# Patient Record
Sex: Female | Born: 1935
Health system: Southern US, Community
[De-identification: ages and names within clinical notes are randomized; demographics above are authoritative.]

## PROBLEM LIST (undated history)

## (undated) DIAGNOSIS — N183 Chronic kidney disease, stage 3 unspecified: Secondary | ICD-10-CM

## (undated) DIAGNOSIS — G4730 Sleep apnea, unspecified: Secondary | ICD-10-CM

## (undated) DIAGNOSIS — E782 Mixed hyperlipidemia: Secondary | ICD-10-CM

## (undated) DIAGNOSIS — I2510 Atherosclerotic heart disease of native coronary artery without angina pectoris: Secondary | ICD-10-CM

## (undated) DIAGNOSIS — K2100 Gastro-esophageal reflux disease with esophagitis, without bleeding: Secondary | ICD-10-CM

## (undated) DIAGNOSIS — I442 Atrioventricular block, complete: Secondary | ICD-10-CM

## (undated) DIAGNOSIS — M1990 Unspecified osteoarthritis, unspecified site: Secondary | ICD-10-CM

## (undated) DIAGNOSIS — I639 Cerebral infarction, unspecified: Secondary | ICD-10-CM

## (undated) DIAGNOSIS — I214 Non-ST elevation (NSTEMI) myocardial infarction: Secondary | ICD-10-CM

## (undated) DIAGNOSIS — I10 Essential (primary) hypertension: Secondary | ICD-10-CM

## (undated) DIAGNOSIS — E669 Obesity, unspecified: Secondary | ICD-10-CM

## (undated) HISTORY — DX: Chronic kidney disease, stage 3 unspecified: N18.30

## (undated) HISTORY — DX: Mixed hyperlipidemia: E78.2

## (undated) HISTORY — DX: Essential (primary) hypertension: I10

## (undated) HISTORY — DX: Obesity, unspecified: E66.9

## (undated) HISTORY — DX: Sleep apnea, unspecified: G47.30

## (undated) HISTORY — PX: CHOLECYSTECTOMY: SHX55

## (undated) HISTORY — DX: Cerebral infarction, unspecified: I63.9

## (undated) HISTORY — DX: Unspecified osteoarthritis, unspecified site: M19.90

## (undated) HISTORY — DX: Chronic kidney disease, stage 3 (moderate): N18.3

---

## 1999-09-27 ENCOUNTER — Encounter: Payer: Self-pay | Admitting: Neurosurgery

## 1999-09-27 ENCOUNTER — Encounter: Admission: RE | Admit: 1999-09-27 | Discharge: 1999-09-27 | Payer: Self-pay | Admitting: Neurosurgery

## 2000-05-23 ENCOUNTER — Ambulatory Visit (HOSPITAL_COMMUNITY): Admission: RE | Admit: 2000-05-23 | Discharge: 2000-05-23 | Payer: Self-pay | Admitting: Rehabilitation

## 2000-05-23 ENCOUNTER — Encounter: Payer: Self-pay | Admitting: Cardiology

## 2002-03-04 ENCOUNTER — Encounter: Payer: Self-pay | Admitting: Cardiology

## 2002-03-04 ENCOUNTER — Ambulatory Visit (HOSPITAL_COMMUNITY): Admission: RE | Admit: 2002-03-04 | Discharge: 2002-03-05 | Payer: Self-pay | Admitting: Cardiology

## 2004-05-01 ENCOUNTER — Encounter: Admission: RE | Admit: 2004-05-01 | Discharge: 2004-05-01 | Payer: Self-pay | Admitting: Cardiology

## 2004-07-13 ENCOUNTER — Inpatient Hospital Stay (HOSPITAL_COMMUNITY): Admission: EM | Admit: 2004-07-13 | Discharge: 2004-07-15 | Payer: Self-pay

## 2004-07-14 ENCOUNTER — Encounter (INDEPENDENT_AMBULATORY_CARE_PROVIDER_SITE_OTHER): Payer: Self-pay | Admitting: Cardiology

## 2005-09-10 HISTORY — PX: CORONARY ANGIOPLASTY WITH STENT PLACEMENT: SHX49

## 2005-09-21 ENCOUNTER — Ambulatory Visit (HOSPITAL_COMMUNITY): Admission: RE | Admit: 2005-09-21 | Discharge: 2005-09-21 | Payer: Self-pay | Admitting: Family Medicine

## 2006-09-08 ENCOUNTER — Inpatient Hospital Stay (HOSPITAL_COMMUNITY): Admission: EM | Admit: 2006-09-08 | Discharge: 2006-09-11 | Payer: Self-pay | Admitting: Emergency Medicine

## 2006-09-24 ENCOUNTER — Inpatient Hospital Stay (HOSPITAL_COMMUNITY): Admission: AD | Admit: 2006-09-24 | Discharge: 2006-09-26 | Payer: Self-pay | Admitting: Cardiovascular Disease

## 2006-10-17 ENCOUNTER — Encounter (HOSPITAL_COMMUNITY): Admission: RE | Admit: 2006-10-17 | Discharge: 2006-11-16 | Payer: Self-pay | Admitting: Cardiology

## 2006-11-18 ENCOUNTER — Encounter (HOSPITAL_COMMUNITY): Admission: RE | Admit: 2006-11-18 | Discharge: 2006-12-18 | Payer: Self-pay | Admitting: Cardiology

## 2006-12-20 ENCOUNTER — Encounter (HOSPITAL_COMMUNITY): Admission: RE | Admit: 2006-12-20 | Discharge: 2007-01-19 | Payer: Self-pay | Admitting: Cardiology

## 2008-04-30 ENCOUNTER — Ambulatory Visit (HOSPITAL_COMMUNITY): Admission: RE | Admit: 2008-04-30 | Discharge: 2008-04-30 | Payer: Self-pay | Admitting: Family Medicine

## 2008-08-23 ENCOUNTER — Emergency Department (HOSPITAL_COMMUNITY): Admission: EM | Admit: 2008-08-23 | Discharge: 2008-08-23 | Payer: Self-pay | Admitting: Emergency Medicine

## 2009-11-08 DIAGNOSIS — I2510 Atherosclerotic heart disease of native coronary artery without angina pectoris: Secondary | ICD-10-CM

## 2009-11-08 HISTORY — DX: Atherosclerotic heart disease of native coronary artery without angina pectoris: I25.10

## 2010-09-27 DIAGNOSIS — G4730 Sleep apnea, unspecified: Secondary | ICD-10-CM

## 2010-09-27 HISTORY — DX: Sleep apnea, unspecified: G47.30

## 2011-01-06 ENCOUNTER — Emergency Department (HOSPITAL_COMMUNITY)
Admission: EM | Admit: 2011-01-06 | Discharge: 2011-01-06 | Disposition: A | Discharge status: Home / Self Care | Payer: Medicare Other | Attending: Emergency Medicine | Admitting: Emergency Medicine

## 2011-01-06 ENCOUNTER — Emergency Department (HOSPITAL_COMMUNITY): Payer: Medicare Other

## 2011-01-06 DIAGNOSIS — Y93E2 Activity, laundry: Secondary | ICD-10-CM | POA: Insufficient documentation

## 2011-01-06 DIAGNOSIS — X500XXA Overexertion from strenuous movement or load, initial encounter: Secondary | ICD-10-CM | POA: Insufficient documentation

## 2011-01-06 DIAGNOSIS — Y998 Other external cause status: Secondary | ICD-10-CM | POA: Insufficient documentation

## 2011-01-06 DIAGNOSIS — I2510 Atherosclerotic heart disease of native coronary artery without angina pectoris: Secondary | ICD-10-CM | POA: Insufficient documentation

## 2011-01-06 DIAGNOSIS — Z7902 Long term (current) use of antithrombotics/antiplatelets: Secondary | ICD-10-CM | POA: Insufficient documentation

## 2011-01-06 DIAGNOSIS — S6390XA Sprain of unspecified part of unspecified wrist and hand, initial encounter: Secondary | ICD-10-CM | POA: Insufficient documentation

## 2011-01-06 DIAGNOSIS — Z7982 Long term (current) use of aspirin: Secondary | ICD-10-CM | POA: Insufficient documentation

## 2011-01-06 DIAGNOSIS — I10 Essential (primary) hypertension: Secondary | ICD-10-CM | POA: Insufficient documentation

## 2011-01-06 DIAGNOSIS — Z79899 Other long term (current) drug therapy: Secondary | ICD-10-CM | POA: Insufficient documentation

## 2011-01-06 DIAGNOSIS — Y92009 Unspecified place in unspecified non-institutional (private) residence as the place of occurrence of the external cause: Secondary | ICD-10-CM | POA: Insufficient documentation

## 2011-01-26 NOTE — H&P (Signed)
NAMESuzannah, [name]                ACCOUNT NO.:  [id number]   MEDICAL RECORD NO.:  [id number]          PATIENT TYPE:  INP   LOCATION:                               FACILITY:  [facility]   PHYSICIAN:  Quita Skye. Collman, MDDATE OF BIRTH:  May 18, 1934   DATE OF ADMISSION:  07/13/2004  DATE OF DISCHARGE:                                HISTORY & PHYSICAL   [name] is a 77 year old black woman who is admitted to [facility] for further evaluation of chest pain.   The patient most recently underwent cardiac catheterization in 2003 for  chest pain.  This demonstrated nonobstructive coronary artery disease.  Coronary circulation was left dominant.  There was a 60% mid LAD lesion with  evidence of myocardial bridging.  There was also a 30% mid right coronary  lesion.  Left ventricular function was normal.  Coronary circulation was  left dominant.   The patient has a history of hypertension and dyslipidemia.  There is no  history of diabetes mellitus, smoking, or family history of coronary artery  disease.   The patient presented to the emergency department after experiencing an  episode chest pain which began at home.  This was described as a pressure  which was located in the mid substernal region.  It did not radiate.  It was  associated with nausea and diaphoresis, but no dyspnea.  It began while she  was at rest.  There were no other associated symptoms.  It appeared to be  unrelated to position, activity, meals, or respirations.  There were no  exacerbating or ameliorating factors.  It resolved spontaneously after  approximately 20 minutes.  She had a similar, but briefer, episode in the  hospital.  She denied chest pain and otherwise asymptomatic at this time.   The patient is on a number of medications for the aforementioned problems.  These include Verapamil, Coreg, Celebrex, Nexium, and aspirin.   MEDICATION ALLERGIES:  CODEINE AND DARVOCET.   PREVIOUS OPERATIONS:   Cholecystectomy and carpal tunnel release.   FAMILY HISTORY:  Noncontributory.   REVIEW OF SYSTEMS:  Reveals no new problems related to her head, eyes, ears,  nose, mouth, throat, lungs, gastrointestinal system, genitourinary system,  or extremities.  There is no history of neurologic or psychiatric disorder.  There is no history of fever, chills, or weight loss.   PHYSICAL EXAMINATION:  Blood pressure 119/57, pulse 61 and regular,  respirations 22, temperature __________  .  The patient was an obese, middle-  aged black woman in no discomfort.  She was alert, oriented, appropriate,  and responsive.  HEAD/EYES/NOSE/MOUTH:  Normal.  NECK:  Without thyromegaly or adenopathy.  Carotid pulses were palpable  bilaterally and without bruits.  CARDIAC:  Examination revealed a normal S1 and S2.  There was no S3, S4,  murmur, rub, or click.  Cardiac rhythm was regular.  No chest wall  tenderness was noted.  LUNGS:  Clear.  ABDOMEN:  Soft and nontender.  There was no mass, tenderness,  hepatosplenomegaly, bruit, distention, rebound, guarding, or rigidity.  Bowel sounds were normal.  RECTAL/GENITALIA:  Examinations were not performed as they were not  pertinent to the reason for acute care hospitalization.  EXTREMITIES:  Without edema, deviation, or deformity.  Brief screening  neurologic survey was unremarkable.   The electrocardiogram revealed sinus bradycardia with first degree AV block  and right bundle branch block.  The chest radiograph, according to the  radiologist, demonstrated cardiomegaly and pulmonary venous hypertension.  The initial set of cardiac markers revealed a myoglobin of 57.3, CK-MB 1.6,  and troponin less than 0.05.  The second set of markers revealed a myoglobin  of 74.0, CK-MB 1.4, and troponin less than 0.05.  Potassium was 4.5, BUN 8,  creatinine 0.8.  White count was 5.4 with hemoglobin of 14.4 and a  hematocrit of 41.7.  D-Dimer was positive.  The chest CT  demonstrated no  evidence of pulmonary embolus.   IMPRESSION:  1.  Chest pain, rule out unstable angina.  A pulmonary embolus has been      excluded.  2.  Nonobstructive coronary artery disease.  Cardiac catheterization in 2003      demonstrated normal left ventricular function and a left dominant      coronary circulation.  There was a 60 lesion in the mid left anterior      descending artery with myocardial bridging.  There was a 30% mid right      coronary artery lesion.  3.  Hypertension.  4.  Dyslipidemia.   PLAN:  1.  Telemetry.  2.  Serial cardiac enzymes.  3.  Aspirin.  4.  Intravenous heparin.  5.  Intravenous nitroglycerin.  6.  Continue Coreg.  7.  Further measures per Dr. Patty Sermons.        ___________________________________________  Quita Skye. Waldon Reining, MD    MSC/MEDQ  D:  07/13/2004  T:  07/13/2004  Job:  [id number]   cc:   [name], M.D.  [address]  [place]  [place] [address]  Fax: [phone]

## 2011-01-26 NOTE — Discharge Summary (Signed)
NAMColvin Caroli:  [name], [name]                ACCOUNT NO.:  [id number]   MEDICAL RECORD NO.:  [id number]          PATIENT TYPE:  INP   LOCATION:  3704                         FACILITY:  [facility]   PHYSICIAN:  Madaline SavageWilliam H. Gamble, M.D.DATE OF BIRTH:  08-Feb-1934   DATE OF ADMISSION:  09/08/2006  DATE OF DISCHARGE:  09/11/2006                               DISCHARGE SUMMARY   DISCHARGE DIAGNOSES:  1. Subendocardial myocardial infarction this admission, treated with      ramus intermedius Cypher stenting by Dr. Tresa EndoKelly.  2. History of coronary disease with prior right coronary artery      stenting, November of 2005.  3. Preserved LV function.  4. Right bundle branch block.  5. Obesity.  6. Treated hypertension.  7. Treated dyslipidemia.   HOSPITAL COURSE:  The patient is a 77 year old African American female  with a history of coronary disease.  She is followed by Dr. Elsie LincolnGamble.  She  had an RCA stent in 2005.  She was admitted September 08, 2006, after she  awakened with substernal chest pain with radiation to her back.  It was  associated with weakness and nausea and vomiting.  Initial enzymes were  negative but second set showed a troponin of 0.24.  She was admitted by  Dr. Tresa EndoKelly.  Subsequent CK-MBs peaked at 2351 with 288 MBs.  She was put  on IV Heparin, nitrates, taken to the cath lab on September 09, 2006.  Catheterization revealed a patent RCA stent, patent LAD with some  myocardial bridging, patent diagonal, patent circumflex with a 60% to  70% mid to distal circumflex narrowing, and a total proximal ramus  intermedius.  She underwent PCI and Cypher stenting to the ramus  intermedius with good results.  The patient was transferred to telemetry  and ambulated.  Hemoglobin was 6.1.  We did make some adjustments in her  medications prior to discharge.  We increased her Vytorin to 10/80 as  her LDL was 102.  We stopped her verapamil and changed it to Norvasc and  added an ACE inhibitor.  She will  follow up with Dr. Elsie LincolnGamble as an  outpatient.   LABORATORY:  Sodium 139, potassium 4.3, BUN 8, creatinine 0.7.  LFTs are  normal.  TSH is 1.03.  white count 6.4, hemoglobin 14.3, hematocrit  40.4, platelets 220.  Hemoglobin A1c is 6.1.  INR is 1.0.  CKs peaked to  2351 and 288 MBs.  Lipids:  Show a cholesterol of 259, HDL 51, LDL was  198, repeat LDL was 102.  Chest x-ray showed mild cardiomegaly without  congestive failure.   EKG shows sinus rhythm with right bundle branch block and left anterior  fascicular block.   DISCHARGE MEDICATIONS:  1. Coated aspirin daily.  2. Coreg 12.5 mg b.i.d.  3. Vytorin 10/80 daily.  4. Norvasc 5 mg a day.  5. Lisinopril 10 mg a day.  6. Nitroglycerin sublingual p.r.n.  7. Plavix 75 mg a day.  8. Nexium daily.   DISPOSITION:  The patient is discharged in stable condition and will  follow up with Dr. Elsie LincolnGamble as  an outpatient.      Abelino Derrick, P.A.    ______________________________  Madaline Savage, M.D.    Lenard Lance  D:  09/11/2006  T:  09/11/2006  Job:  [id number]   cc:   Madaline Savage, M.D.  Patrica Duel, M.D.

## 2011-01-26 NOTE — Discharge Summary (Signed)
NAMColvin Johnson:  Hoerner, Aleksa                ACCOUNT NO.:  [id number]   MEDICAL RECORD NO.:  [id number]          PATIENT TYPE:  INP   LOCATION:  2623                         FACILITY:  [facility]   PHYSICIAN:  Madaline SavageWilliam H. Gamble, M.D.DATE OF BIRTH:  29-Oct-1933   DATE OF ADMISSION:  09/24/2006  DATE OF DISCHARGE:  09/26/2006                               DISCHARGE SUMMARY   Ms. [name] is a 77 year old African American female patient who is  the mother of Lamar BenesSandra Schwegel, Charity fundraiserN.  She came to the office because of  severe abdominal pain, nausea, vomiting and unable to take her  medications.  She had recently been in the hospital and discharged on  September 11, 2006 with a subendocardial MI and she had a CYPHER stent  placed to her ramus intermedius.  It was decided that she should be  admitted.  She was unable to take her medications.  She had vomited once  and she had such severe pain that she could not sleep all night.  Thus,  she was admitted.  A GI consult was called.  She was seen by Dr. Elnoria HowardHung.  A CT of her abdomen and pelvis were ordered, along with an ultrasound.  Urinalysis was done because of recent Foley catheterization and  complaints of frequency.  A CT of her abdomen only showed  diverticulosis, no diverticulitis, no pancreatitis.  Her amylase and  lipase were normal, along with her lactic acid.  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 Larey DresserLloyd Peterson.  He recommended that she be given  trimethoprim 100 mg b.i.d. x3 days.  He also thought that her  retroperitoneal fibrosis could sometimes be related to malignancy or  autoimmune disease.  Also, medications can cause this which include  hydralazine, beta blockers and methysergide.  Also, lymphoma can  sometimes mimic retroperitoneal fibrosis.  His recommendations were to  treat her medically.  Her abdominal pain was improved at this time and  that she should be followed as  an outpatient with ultrasound, at least  every 6 months initially, to make sure she does not develop any  hydronephrosis.  He felt that the retroperitoneal fibrosis could just be  the incidental finding also.  She was seen by Dr. Elsie LincolnGamble on September 26, 2006.  Her blood pressure was low.  Her medications were adjusted.  Later on, in the afternoon, she walked in the hall with a stable blood  pressure and it was decided that she was stable to be discharged home.   LABS:  Sodium was 133, potassium was 4.0, chloride of 100, CO2 24,  glucose was 117, BUN 7, creatinine 0.80.  SGOT was 33 and SGPT was 24.  Alkaline phosphatase was 89.  Bilirubin was 0.6.  Lipase was 16.  Her  lactic acid was 1.5.  Blood cultures were negative and her urine culture  was negative.  Abdominal ultrasound showed no acute abdominal CT finding  and she is status post cholecystectomy with mild prominence of common  bile duct.  Abdominal CT scan showed sigmoid diverticulosis without  diverticulitis.  She had abdominal inflammatory changes of  retroperitoneum centered around her bilateral ureters.  No  hydronephrosis.  Normal perfusion and excretion of contrast by both  kidneys.  Findings were determined to be nonspecific.  It also stated  inflammatory process involving the ureters and retroperitoneal fibrosis  was also a clinical consideration.  She had some fat density lesion  within her distal small bowel, consistent with lipoma and she had  bilateral low density lesions within both kidneys, likely cysts and a  hiatal hernia.   DISCHARGE MEDICATIONS:  1. Nexium 40 mg 1 time per day.  2. Aspirin 81 mg 1 time per day.  3. Plavix 75 mg 1 timer per day.  4. Coreg 3.125 mg twice per day.  5. Vytorin 10/80 at bedtime.  6. Trimethoprim 100 mg 2 times a day for 4 more doses.  7. She is not to take any of her other medications.   She is to return to Dr. Elsie Lincoln on January 31 at 2 p.m. and she is to see  Dr. Vonita Moss if she  has any problems urologically and follow up with Dr.  Nobie Putnam in 3 weeks.  Also, she will need have followup abdominal and  renal ultrasound in 6 months per Dr. Enos Fling advice.   DISCHARGE DIAGNOSES:  1. 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  2. Coronary artery disease, recent stent to her ramus intermedius with      a subendocardial myocardial infarction.  She needs to remain on      Plavix for 1 year, secondary to her CYPHER stent.  3. Hypotension, resolved with decrease in medications.  4. Hyperlipidemia.  5. Right bundle branch block.  6. History of cholecystectomy.      Lezlie Octave, N.P.    ______________________________  Madaline Savage, M.D.    BB/MEDQ  D:  09/26/2006  T:  09/27/2006  Job:  [id number]   cc:   Patrica Duel, M.D.  Maretta Bees. Vonita Moss, M.D.  Jordan Hawks Elnoria Howard, MD

## 2011-01-26 NOTE — Cardiovascular Report (Signed)
[place]  Patient:    [name], [name] Visit Number: [id number] MRN: [id number]          Service Type: CAT Location: 3700 [id number] Attending Physician:  Ophelia ShoulderGamble, William Hedrick Dictated by:   Madaline SavageWilliam H. Gamble, Johnson.D. Proc. Date: 03/04/02 Admit Date:  03/04/2002   CC:         Jonell CluckMark S. Cresenzo, Johnson.D.  Blake DivineA. Tori Bradsher, Johnson.D.  Cardiac Catheterization Laboratory   Cardiac Catheterization  PROCEDURES PERFORMED: 1. Selective coronary angiography by Judkins technique. 2. Retrograde left heart catheterization. 3. Left ventricular angiography. 4. Intravascular ultrasound of the left anterior descending coronary artery.  COMPLICATIONS: None.  ENTRY SITE: Right femoral.  DYE USED: Omnipaque.  MEDICATIONS GIVEN: Versed 1 mg and fentanyl 50 mg for neck pain and sedation.  PATIENT PROFILE: The patient is a 77 year old African American woman who has had chest pain of a chronic stable anginal nature for four years. She has been treated with verapamil for asymmetric septal hypertrophy and diastolic dysfunction of the heart and is known to have a long segment of her mid to distal LAD that is felt to be intramyocardial.  The patient recently had a Cardiolite stress test showing evidence of anterior wall ischemia mild to moderate degree with no significant interval change since the Cardiolite of May 16, 2000. The left ventricular ejection fraction was 62%.  RESULTS:  PRESSURES: The left ventricular pressure was 137/14, central aortic pressure was 135/70 with a mean of 100.  No aortic valve gradient was noted by pullback technique.  ANGIOGRAPHIC RESULTS: The left main coronary artery was a very large vessel and normal in appearance.  The LAD coursed to the cardiac apex and wrapped around the apex and gave rise to a very large diagonal branch before the septal perforator branch and a second diagonal branch near the origin of the third septal  perforator branch. Both diagonals were normal. The LAD right after the second diagonal and third septal perforator becomes smoothly narrowed over a segment of about 25 mm with a smooth lumen and no appearance of any atherosclerotic stenosis. The LAD more distal shows a slight kinking motion during systole, and the vessel again becomes a fairly large vessel as it approaches the apex. This vessel appears as it did on a prior catheterization dated 2001. The circumflex coronary artery and intermediate ramus branch are both very large and are dominant vessels. No lesions are seen in either vessel.  The right coronary artery is small and nondominant with a 30% mid right coronary artery lesion.  The left ventricle shows symmetrical contractility of all wall segments. There is noted to be left ventricular hypertrophy seen. There is no LV thrombus. Ejection fraction estimate of 65% with no mitral regurgitation.  Intravascular ultrasound was then performed using a 6 French left Judkins guide catheter without side holes. A Patriot guide wire and a Atlantis intravascular ultrasound catheter. The patient was given 2500 units of heparin and was also given 200 mcg of intracoronary nitroglycerin before the Atlantis device was deployed. I made extra sure to begin IVUS imaging distal in the very distal portion of the LAD almost near the apex, well beyond the area of suspected intramyocardial bridging.  IVUS showed that the distal LAD lumen diameter was 3 without significant plaquing as the vessel began to show systolic bridging, the vessel then becomes 2.25 to 2 mm in diameter in the mid segment of the intramyocardial bridged LAD segment, we note a lumen diameter  of about 1.5 without plaquing. As we approach the second diagonal branch, which is the beginning of the area of myocardial bridging, the LAD now becomes 3 mm again and as we progressively pull back the Atlantis catheter, the vessel becomes 4  and finally 5 mm.  There is mild plaquing in the more proximal LAD which is not significant.  FINAL DIAGNOSES: 1. Clinical angina with anterior wall ischemia by Cardiolite. 2. A 60% intraluminal smooth stenosis of the mid left anterior descending    with suspected intramyocardial bridging phenomenon. 3. Dominant left circumflex coronary artery. 4. Insignificant 30% stenosis in the mid right coronary artery. 5. Normal left ventricular systolic function with angiographic evidence of    LVH.  PLAN: The patients dose of verapamil will be increased. We will use an alpha and beta blocker combination for treatment of angina and will continue to follow the patient. Dictated by:   Madaline Savage, Johnson.D. Attending Physician:  Ophelia Shoulder DD:  03/04/02 TD:  03/05/02 Job: [id number]

## 2011-01-26 NOTE — Discharge Summary (Signed)
NAMEJOHNNAE, IMPASTATO                ACCOUNT NO.:  [id number]   MEDICAL RECORD NO.:  [id number]          PATIENT TYPE:  INP   LOCATION:  6526                         FACILITY:  [facility]   PHYSICIAN:  Madaline Savage, M.D.DATE OF BIRTH:  Jul 31, 1934   DATE OF ADMISSION:  07/12/2004  DATE OF DISCHARGE:  07/15/2004                                 DISCHARGE SUMMARY   HISTORY OF PRESENT ILLNESS:  Ms. [name] is a 77 year old, African  American female patient who was admitted to the hospital with chest pain.  She does have a history of nonobstructive coronary disease in the past.  She  had a catheterization in 2003.  She had a 60% mid LAD lesion with do  myocardial bridging.  She had a 30% mid right coronary lesion.  LV function  was normal at that time.   HOSPITAL COURSE:  She was admitted, put on IV heparin and IV nitroglycerin  and her medicines were continued.  She was seen by Dr. Elsie Lincoln the following  day, who is apparently her primary care doctor and knows her well.  Her last  catheterization was on March 02, 2004.  At that time, it was stable.  She  does have known right bundle branch block.  She also has been having  lightheadedness and bradycardia.  This was thought secondary to her  medications.  It was felt that she should undergo repeat cardiac  catheterization because of her symptoms.  This was performed by Dr. Elsie Lincoln.  She was found to have a progressed lesion in her RCA of 75%.  Her EF was 70-  80%.  She still has myocardial bridging in her LAD and ramus.  She underwent  PTCA and stenting with a 2.5 x 15 mm Taxus stent to her RCA.  Post procedure  on July 15, 2004, she had no chest pain.  Her blood pressure was 118/52.  Her pulse was 60-72, respirations 18 and she was afebrile.  Her room air  saturations were 97%.  Her right groin was without any hematoma.  She had 2+  dorsalis pedis pulse.  She was seen by cardiac rehabilitation and referred  for phase 2.   LABORATORIES:  On July 15, 2004, the hemoglobin was 13.7, hematocrit  39.4, WBC 11.4 and platelets 194.  Her sodium was 138, potassium 3, BUN 7  and creatinine 0.8.  Her glucose was 113.   DISCHARGE MEDICATIONS:  1.  Aspirin 81 mg a day.  2.  Plavix 75 mg every day for six months.  She should take it through April      and then she may stop.  3.  Coreg 7.5 mg one twice a day.  4.  Nexium 40 mg one time per day.  5.  Vytorin 10/40 mg at bedtime.  6.  Verapamil 240 mg once a day.  7.  Ambien 5 mg as needed to sleep.  8.  Celebrex.  She should start on Sunday and use only as needed.  9.  Nitroglycerin 1/150 mg under the tongue as needed.  10. Diazepam 5 mg twice a  day as needed for back spasm.   ACTIVITY:  She should do no strenuous activity, no lifting, no pushing and  no pulling x 4 days.  No driving x 2 days.   FOLLOWUP:  She should call with any problems with her groin.  She will  follow up with Dr. Elsie Lincoln in approximately two weeks.   DISCHARGE DIAGNOSES:  1.  Chest pain, unstable angina pectoris, with subsequent cardiac      catheterization showing progressed lesion in her right coronary artery,      75%, with subsequent percutaneous transluminal coronary angiography and      2.5 x 15 mm Taxus stent placed.  2.  Known left anterior descending and ramus bridging.  Minor lesion, 40%,      in her left circumflex.  3.  Normal ejection fraction, 70-80%.  4.  Hypertension.  5.  History of lightheadedness and bradycardia secondary to her medications,      which were titrated this admission.  6.  Dyslipidemia.  7.  Back spasm.       BB/MEDQ  D:  07/15/2004  T:  07/16/2004  Job:  [id number]   cc:   Patrica Duel, M.D.  [address]  [place]  [place] [address]  Fax: [phone]

## 2011-01-26 NOTE — Consult Note (Signed)
NAMEShadara, [name]                ACCOUNT NO.:  [id number]   MEDICAL RECORD NO.:  [id number]          PATIENT TYPE:  INP   LOCATION:  3704                         FACILITY:  [facility]   PHYSICIAN:  Jordan Hawks. Elnoria Howard, MD    DATE OF BIRTH:  03/24/1934   DATE OF CONSULTATION:  09/24/2006  DATE OF DISCHARGE:  09/11/2006                                 CONSULTATION   REASON FOR CONSULTATION:  Diffuse abdominal pain, nausea and vomiting.   HISTORY OF PRESENT ILLNESS:  This is a 77 year old female with a past  medical history of hypertension, coronary artery disease, arthritis,  hyperlipidemia, obesity and status post two stent placements in 2005 and  recently in 2008 for coronary artery disease, right bundle branch block,  and laparoscopic cholecystectomy in 1998, who is admitted to the  hospital from the cardiology office for complaints of a diffuse  abdominal pain.  The patient states that the pain started approximately  one we have ago and is progressive.  It is described as being sharp and  initially in the epigastric region.  However, it has tended to be more  diffuse at this time.  Eating will worsened the patient's abdominal  pain.  However, lying in the left lateral decubitus position and  remaining still will help abate the pain.  She does report having a  worsening of this type of pain if she takes any type of p.o.  Subsequently, she has a restricted her p.o. intake at this time, as a  result of this pain.  Having a bowel movement or passing flatus does not  improve the pain.  Additionally, there are no reports of any  hematochezia, diarrhea or melena.  The patient denies any prior luminal  GI problems such as any prior luminal diseases.  The patient was  recently discharged from hospital for a subendocardial and myocardial  infarction.  She had undergone the catheterization without any  difficulty with the Cypher stent placement which she tolerated well.   PAST MEDICAL AND SURGICAL  HISTORY:  As stated above.   FAMILY HISTORY:  Noncontributory.   SOCIAL HISTORY:  The patient lives with her daughter.  She is retired.  Has eleventh grade education.   ALLERGIES:  PENICILLIN, IMDUR, CODEINE AND DARVOCET/.   REVIEW OF SYSTEMS:  Negative for shortness of breath, chest pain,  fevers, diarrhea, constipation.  Positive for abdominal pain.  No  hematochezia, melena.  No dizziness, blurry vision, syncopal episodes.  No chest pain.  Positive for nausea and vomiting.  Positive for history  of GERD.   MEDICATIONS:  1. Coreg 12.5 mg p.o. b.i.d.  2. Diazepam 5 mg p.o. p.r.n.  3. Nexium 40 mg p.o. daily.  4. Celebrex 200 mg p.o. daily.  5. Vytorin 10/80 mg p.o. daily.  6. Ambien 5 mg p.o. q.h.s.  7. Aspirin 81 mg p.o. daily.  8. Norvasc 5 mg p.o. daily.  9. Lisinopril 10 mg p.o. daily.  10.Plavix 75 mg daily   PHYSICAL EXAMINATION:  VITAL SIGNS:  Blood pressure is 150/74, heart  rate is 90, temperature is 98.6.  Weight is 230 pounds.  GENERAL:  The patient does not appear to be in acute distress.  However,  she is uncomfortable.  HEENT:  Normocephalic, atraumatic.  Extraocular muscles intact.  Pupils  equal, round, reactive to light.  NECK:  Supple.  No lymphadenopathy.  LUNGS:  Clear to auscultation bilaterally.  CARDIOVASCULAR:  Regular rhythm.  ABDOMEN:  Obese, soft.  There are surgical scars consistent with her  laparoscopic cholecystectomy.  There is diffuse tenderness throughout.  No rebound or rigidity.  EXTREMITIES:  No clubbing, cyanosis or edema.  RECTAL:  Examination is negative for hemoccult.  There is brown stool on  the examination glove.   LABORATORY VALUES:  Pending at this time.   IMPRESSION:  1. Abdominal pain of questionable etiology.  Differential is drawn at      this time.  2. Recent subendocardial myocardial infarction secondary to coronary      artery disease status post stent placement.  3. Gastroesophageal reflux disease.  4.  Arthritis.  5. Hyperlipidemia.  6. Hypertension.   PLAN:  After evaluation, the source of her abdominal pain is not clear  at this time.  However, a possibility is pancreatitis.  I do not see any  medications that are recent.  There is no clear precipitant of her of  pancreatitis in regards to medications aside for the aspirin which is a  salicylate.  However, that is rare in regards to the cause of  pancreatitis.  Further evaluation with the CT scan is required at this  time.  Other considerations may be intestinal ischemia, and with her  history of coronary artery disease, it would be prudent to evaluate the  patient with a lactic acid fast level.  The patient may require a CT  angiography.  However, I believe a prudent initial first step will be  with a routine CT scan of the abdomen and pelvis.  Additionally, the  patient will also require pain control for her current symptoms.      Jordan Hawks Elnoria Howard, MD  Electronically Signed     PDH/MEDQ  D:  09/24/2006  T:  09/25/2006  Job:  [id number]   cc:   Madaline Savage, M.D.

## 2011-01-26 NOTE — Consult Note (Signed)
[name], [name]                ACCOUNT NO.:  [id number]   MEDICAL RECORD NO.:  [id number]          PATIENT TYPE:  INP   LOCATION:  2623                         FACILITY:  [facility]   PHYSICIAN:  Maretta Bees. Vonita Moss, M.D.DATE OF BIRTH:  [birth year]   DATE OF CONSULTATION:  09/25/2006  DATE OF DISCHARGE:                                 CONSULTATION   I was asked to see this 77 year old lady for evaluation of abdominal CT  that showed findings consistent with some retroperitoneal fibrosis.  CT  was done because she had some generalized abdominal pain that is now  better.  CT scan was consistent with but not necessarily diagnostic of  retroperitoneal fibrosis.  Fortunately, the kidneys showed no evidence  of hydronephrosis.  There was some thickening around the ureters.  She  had bilateral benign renal cysts.  She has had no outright flank pain,  she has had no hematuria.  She passed a stone several years ago and she  has had some frequency of urination recently, had a Foley catheter in,  and there was a concern that she might have had a urinary infection.  Incontinence was not a major problem.   PAST MEDICAL HISTORY:  Hypertension, hyperlipidemia, coronary artery  disease.   PAST SURGICAL HISTORY:  Cholecystectomy, carpal tunnel, tubal ligation,  appendectomy.   MEDICATIONS ON ADMISSION:  Coreg, Niaspan, Nexium, Celebrex, Vytorin,  Ambien, aspirin, Norvasc, Lisinopril, and Plavix.   ALLERGIES:  PENICILLIN, IMDUR, CODEINE, DARVOCET.   SOCIAL HISTORY:  She does not smoke or drink alcohol.   FAMILY HISTORY:  Noncontributory.   REVIEW OF SYSTEMS:  As noted on her health history form.   PHYSICAL EXAMINATION:  Blood pressure is a little volatile, running from  150/87 to 103/59, pulse has been running 70 to 80, and she has been  afebrile.  She is alert and oriented, skin is warm and dry, in no acute  distress. Neck is supple. She is somewhat heavy set.  There is no  abdominal or CVA  tenderness.   Urine had 3-6 white cells, 0-2 red cells, and a few bacteria.  BUN 7,  creatinine 0.8.   IMPRESSION:  1. Urinary frequency which is somewhat worse later, consider UTI,      although urine does not look all that remarkable.  Nonetheless, I      think we will put her on Trimethoprim 100 mg b.i.d. for three days,      make sure there is no underlying infection related to her previous      catheterization.  2. Bilateral benign renal cysts, no treatment necessary.  3. CT scan showing findings consistent with retroperitoneal fibrosis,      but fortunately, no hydronephrosis is noted.   She is not a candidate for surgery and, right now, this appears to be a  medical disease.  The causes of retroperitoneal fibrosis are sometimes  malignancy or auto-immune disease.  Medications that can cause this  problem include hydralazine, beta blockers, and methysergide.  Also,  lymphoma may sometimes mimic retroperitoneal fibrosis.  Medical  treatments when necessary and sometimes  require a biopsy include  steroids and Tamoxifen.  Her abdominal pain seems to be better and that  is sometimes caused by retroperitoneal fibrosis, but this may also be an  incidental finding.  I defer medical workup and medical treatment but  from a urological point of view, I would recommend follow up evaluation,  ultrasound at least every six months initially to make sure she does not  develop hydronephrosis.      Maretta BeesLloyd J. Vonita MossPeterson, M.D.  Electronically Signed     LJP/MEDQ  D:  09/25/2006  T:  09/25/2006  Job:  [id number]   cc:   Gerlene Burdockichard A. Alanda AmassWeintraub, M.D.

## 2011-01-26 NOTE — Cardiovascular Report (Signed)
NAMEMANROOP, [name]                ACCOUNT NO.:  [id number]   MEDICAL RECORD NO.:  [id number]          PATIENT TYPE:  INP   LOCATION:  6599                         FACILITY:  [facility]   PHYSICIAN:  Madaline Savage, M.D.DATE OF BIRTH:  1934-06-22   DATE OF PROCEDURE:  07/14/2004  DATE OF DISCHARGE:                              CARDIAC CATHETERIZATION   PROCEDURES PERFORMED:  1.  Selective coronary angiography by Judkins technique.  2.  Retrograde left heart catheterization.  3.  Left ventricular angiography.  4.  Percutaneous coronary intervention of the mid right coronary artery with      balloon angioplasty followed by coronary stenting.   COMPLICATIONS:  None.   ENTRY SITE:  Right femoral.   DYE USED:  Omnipaque.   PATIENT PROFILE:  The patient is a 77 year old obese African American female  with trivial coronary artery disease who has a long segment of  intramyocardial mid LAD followed by an area of a kinking type of myocardial  bridge and then a normal distal LAD segment.  No other significant coronary  disease has been seen in the coronary tree in the past except for a 30% to  40% area of luminal irregularity in the mid RCA.  She has been known to have  normal-to-high ejection fraction and has asymmetric septal hypertrophy on  her echocardiogram, and has been treated with beta blockade and Verapamil  therapy for episodes of angina associated with her nonobstructive  hypertrophic myopathy.  No percutaneous intervention has been done in the  past.   This admission was prompted by an episode of bradycardia possibly related to  her medication, lightheadedness and chest pain.  Cardiac enzymes have been  negative.  There were no EKG changes of ischemia.  The patient was brought  to the cath lab electively for repeat catheterization.  Her films were  compared to the last study dated 2003.   RESULTS:   PRESSURES:  The left ventricular pressure was 165/10, end-diastolic  pressure  18, central aortic pressure 165/80, mean of 115.  No aortic gradient by  pullback technique.   ANGIOGRAPHIC RESULTS:  Left main coronary artery was normal.   Left anterior descending coronary artery contained the same area of  intramyocardial mid LAD that it has in the past.  It lies over approximately  22-32 mm of length followed by an S-shaped kink in the vessel that is the  end of a myocardial bridge and then the distal LAD again looks normal  distally.  No obstructive coronary disease is seen in the entirety of the  LAD, nor in the diagonal branch.   The intermediate ramus branch bifurcates distally and is a large vessel.  There is a myocardial bridge which is virtually adjacent to the area of  bridging in the LAD, interestingly.  The circumflex is codominant with the  right; it contains only a 40% area of luminal irregularity after the first  obtuse marginal branch.   Right coronary artery is a little on the small side and is codominant with  the circumflex.  It contains, today, a 75% stenosis in  the mid LAD just  before the acute marginal branch arises and this stenosis is worse than it  was in 2003.   LV angiography shows almost cavity obliteration of the distal one-third of  the LV cavity and ejection fraction estimated at 70% to 80%.  No mitral  regurgitation is noted.   PERCUTANEOUS CORONARY INTERVENTION:  Percutaneous coronary intervention was  performed after infusing intravenous heparin to ensure an ACT that ended up  being approximately 305.  We gave double-bolus Integrilin and we maintained  a 6-French arterial sheath.  The guide catheter used was a 6-French 4-cm  right Judkins guide catheter by [employer].  The guidewire was a  Radiographer, therapeutic which crossed the lesion with ease.  Pre-dilatation ballooning  was performed, but with a Maverick 2.5 x 9.0-mm balloon, and post-balloon  dilatation was accomplished with a 2.5 x 16.0-mm TAXUS stent which was   deployed twice, with the second deployment being for a minute up to a peak  inflation pressure of 16 atmospheres.  The lesion was reduced from 75% to 0%  and multiple views confirmed that result.  TIMI-3 class flow was preserved  before and after stenting.   FINAL DIAGNOSES:  1.  Worsened lesion in the mid right coronary artery previously 30% to 40%,      now 75%.  2.  Successful coronary artery stenting of the mid right coronary artery      with reduction of the lesion of 75% to 0%.  3.  Forty percent distal circumflex stenosis, new.  4.  Unchanged anatomy of a left anterior descending intramyocardial segment,      mid-vessel, with an S-shaped kinking bridge more distal to the      intramyocardial left anterior descending.  5.  Super-normal left ventricular systolic function.   PLAN:  The patient was given 300 mg of Plavix in the cath lab, Pepcid and  will be given 300 mg more of Plavix tonight, and Integrilin will be  continued for 18 hours.       WHG/MEDQ  D:  07/14/2004  T:  07/15/2004  Job:  [id number]   cc:   [name], [facility]   [facility]

## 2011-01-26 NOTE — Cardiovascular Report (Signed)
NAMELEOTA, MAKA                ACCOUNT NO.:  [id number]   MEDICAL RECORD NO.:  [id number]          PATIENT TYPE:  INP   LOCATION:  2910                         FACILITY:  [facility]   PHYSICIAN:  Nicki Guadalajara, M.D.     DATE OF BIRTH:  Mar 19, 1934   DATE OF PROCEDURE:  09/09/2006  DATE OF DISCHARGE:                            CARDIAC CATHETERIZATION   PROCEDURE:  Cardiac catheterization and percutaneous coronary  intervention.   INDICATIONS:  Ms. [name] is a 77 year old African American female  patient of Dr. Elsie Lincoln with known CAD status post stenting of her right  coronary artery in 2005.  At that time, she did have a mid-LAD  intramyocardial branch with some kinking.  She has a history of  hypertension and hyperlipidemia.  She had presented yesterday morning.  She was awakened from sleep at approximately 3:00 a.m. with chest  pressure.  She ultimately presented to [facility] later in the  day.  Cardiac markers were positive.  She was started on heparin, IC  nitroglycerin as well as Integrilin due to ST-segment depression in the  setting of her right bundle branch block.  Cardiac enzymes subsequently  were positive.  She has been maintained on Integrilin/heparin and  nitroglycerin.  Has had mild residual chest tightness and is now  referred for cardiac catheterization and possible coronary intervention.   PROCEDURE:  After premedication with Valium 5 mg intravenously, the  patient was prepped and draped in the usual fashion.  Her right femoral  artery was punctured anteriorly and a 5-French sheath was inserted.  Diagnostic catheters was done utilizing 5-French FL-5 catheter as well  as a 5-French FR-4 diagnostic catheter.  A pigtail catheter was used for  biplane selective artery.  Due to the patient's significant hypertension  distal aortography was also done.  There was no renovascular etiology to  her hypertension.  With the demonstration of total occlusion of a  ramus  intermediate vessel with faint collaterals the decision was made to  attempt intervention.  The old films were reviewed which did demonstrate  a previous large ramus intermediate system.  The arterial sheath was  upgraded to a 6-French system.  The patient has been on Integrilin and  she received 300 mg of oral Plavix.  She received 4000 units of heparin  after ACT was recorded.  Subsequent ACT was therapeutic.  A 6-French CLS-  4 catheter was used for the intervention.  A Cougar XT wire was able to  cross the total occlusion.  Predilatation just beyond the ostium of the  intermediate vessel was done with a 20 x 15-mm sprinter balloon.  This  resulted in restoration of flow, but it was apparent that there was a  significant thrombus burden and a fetch thrombectomy catheter was  inserted with significant thrombus removal.  A 3.0 x 18-mm drug-eluting  CYPHER stent was then successfully inserted and deployed just after the  ostium x2 up to 16 atmospheres.  A 3.25 x 15-mm Quantum balloon was used  for post stent dilatation up to 14 atmospheres corresponding to a 3.29  mm  size.  Scout angiography confirmed an excellent angiographic result  with brisk TIMI III flow and a residual narrowing of 0%.  During the  procedure the patient also did receive Lopressor IV 2.5 mg x2 due to her  continued hypertension and her nitroglycerin drip was titrated up to 60  mcg.  Also during the procedure because of repetitive need for urination  prior to the start of the intervention a Foley catheter was also  inserted.  The arterial sheath was sutured in place with plan for sheath  removal later today.   HEMODYNAMIC DATA:  Central aortic pressure was 197/101, mean 144, left  ventricular pressure of 197/17, post A-wave 31.   ANGIOGRAPHIC DATA:  The left main coronary artery was a very short  vessel which trifurcated into an LAD, a ramus intermediate vessel and  left circumflex vessel.   The LAD was a  large caliber vessel that gave rise to one large first  diagonal vessel.  After the second diagonal vessel in the mid LAD there  was intramyocardial segment which appeared to have muscle bridging  during systole of approximately 50%.  Faint collateralization was seen  in the distal intermediate vessel free via the LAD injection.   The intermediate vessel was totally occluded proximally.   The circumflex vessel was a large caliber codominant vessel.  There was  60-70% focal narrowing distally prior to the PLA origin after two  marginal vessels distally.   The right coronary was a nondominant vessel with a widely patent stent  in its mid segment.   Biplane selective arteriography revealed preserved global contractility  with an ejection fraction of at least 60%.  On the RAO projection there  was a subtle minimal focal hypocontractility in the distal anterolateral  wall.  On the LAO projection there was subtle minimal hypocontractility  in the mid posterolateral wall.   Distal aortography did not demonstrate any renal artery stenosis.  There  was no significant aortoiliac disease.   Following percutaneous coronary intervention after the initial  dilatation with the sprinter balloon this resulted in resumption of TIMI  III flow, but just beyond the initial lesion was a significant thrombus  burden.  This cleared following utilization of the fetch thrombectomy  catheter.  Following successful stenting with a 3.0 x 18 mm CYPHER stent  post dilated to 3.29 mm the 100% occlusion was reduced to 0%.  There was  brisk TIMI III flow.  The ramus intermedius vessel was a large-caliber  vessel which extended to the apex and bifurcated in its mid segment.   IMPRESSION:  1. Acute coronary syndrome secondary to total occlusion of the ramus      intermediate vessel with faint left-to-left collaterals. 2. Mid LAD intramyocardial midsystolic bridging with narrowing of      approximately 50% during  systole.  3. Total occlusion of the ramus intermediate vessel.  4. Sixty to 70% distal stenosis in the circumflex vessel in a dominant      circumflex system.  5. Widely patent stent in the mid right coronary artery.  6. Successful PTCA/thrombectomy/stenting of the ramus intermediate      vessel with the 100% stenosis being reduced to 0% resulting in      brisk TIMI III flow after insertion of a 3.0 x 18 mm CYPHER stent      post dilated to 3.29 mm.           ______________________________  Nicki Guadalajara, M.D.     TK/MEDQ  D:  09/09/2006  T:  09/09/2006  Job:  [id number]   cc:   Madaline Savage, M.D.  Patrica Duel, M.D.

## 2011-01-26 NOTE — Cardiovascular Report (Signed)
[place]  Patient:    [name], [name]                       MRN: [id number] Proc. Date: 05/23/00 Adm. Date:  [id number] Attending:  Ophelia Shoulder CC:         Cardiac Catheterization Laboratory   Cardiac Catheterization  PROCEDURES PERFORMED: 1. Selective coronary angiography by Judkins technique. 2. Retrograde left heart catheterization. 3. Left ventricular angiography.  COMPLICATIONS:  None.  ENTRY SITE:  Right femoral.  DYE USED:  Omnipaque.  PATIENT PROFILE:  The patient is a 77 year old woman who has had periodic anginal chest pain.  A recent nuclear medicine Cardiolite stress test showed evidence of anterior wall ischemia by previous cardiac catheterization.  The patient was known to have a long segmental stenosis in her mid to distal LAD. Todays outpatient cardiac catheterization is done on an outpatient basis electively to reaccess her coronary anatomy.  RESULTS:  PRESSURES:  The left ventricular pressure was 150/22, the central aortic pressure was 150/80, mean of 110.  No aortic valve gradient by pullback technique.  RESULTS:  The left main coronary artery was angiographically patent.  The left anterior descending artery coursed at the cardiac apex and gave rise to one optional diagonal branch arising before the first septal perforator branch and a second small diagonal branch arising after the second septal perforator branch.  After the second septal perforator branch there is a 65-70% lesion in the mid LAD that contains some systolic bridging.  Then the distal LAD becomes normal in size again and has a normal segment.  There is a intermediate ramus branch which bifurcates distally which is normal.  The left circumflex is a dominant vessel with a posterior descending branch arising distally.  The right coronary artery is nondominant and contains no lesions.  LEFT VENTRICULOGRAM:  The left ventricle shows  normal contractility with prominent papillary muscles consistent with left ventricular hypertrophy.  No wall motion abnormalities are seen.  The ejection fraction is 70%.  There is no mitral regurgitation or LV thrombus.  FINAL DIAGNOSES:  Isolated 60-70% stenosis in the mid left anterior descending, unchanged from 1977.  PLAN:  Continued medical therapy. DD:  05/23/00 TD:  05/23/00 Job: [id number]

## 2011-01-26 NOTE — Cardiovascular Report (Signed)
[name], [name]                ACCOUNT NO.:  [id number]   MEDICAL RECORD NO.:  [id number]          PATIENT TYPE:  INP   LOCATION:  2910                         FACILITY:  [facility]   PHYSICIAN:  Nicki Guadalajara, M.D.     DATE OF BIRTH:  30-Oct-1933   DATE OF PROCEDURE:  09/09/2006  DATE OF DISCHARGE:                            CARDIAC CATHETERIZATION           ______________________________  Nicki Guadalajara, M.D.     TK/MEDQ  D:  09/09/2006  T:  09/09/2006  Job:  [id number]

## 2011-06-15 LAB — POCT CARDIAC MARKERS
CKMB, poc: 1.6 ng/mL (ref 1.0–8.0)
Myoglobin, poc: 79.9 ng/mL (ref 12–200)
Troponin i, poc: <0.05 ng/mL (ref 0.00–0.09)

## 2011-06-15 LAB — URINALYSIS, ROUTINE W REFLEX MICROSCOPIC
Bilirubin Urine: NEGATIVE
Glucose, UA: NEGATIVE mg/dL
Ketones, ur: NEGATIVE mg/dL
Leukocytes, UA: NEGATIVE
Nitrite: NEGATIVE
Protein, ur: NEGATIVE mg/dL
Specific Gravity, Urine: 1.005 (ref 1.005–1.030)
Urobilinogen, UA: 0.2 mg/dL (ref 0.0–1.0)
pH: 6.5 (ref 5.0–8.0)

## 2011-06-15 LAB — DIFFERENTIAL
Basophils Absolute: 0 10*3/uL (ref 0.0–0.1)
Basophils Relative: 0 % (ref 0–1)
Eosinophils Absolute: 0 10*3/uL (ref 0.0–0.7)
Eosinophils Relative: 0 % (ref 0–5)
Lymphocytes Relative: 32 % (ref 12–46)
Lymphs Abs: 1.6 10*3/uL (ref 0.7–4.0)
Monocytes Absolute: 0.4 10*3/uL (ref 0.1–1.0)
Monocytes Relative: 7 % (ref 3–12)
Neutro Abs: 2.9 10*3/uL (ref 1.7–7.7)
Neutrophils Relative %: 60 % (ref 43–77)

## 2011-06-15 LAB — BASIC METABOLIC PANEL
BUN: 13 mg/dL (ref 6–23)
CO2: 31 mEq/L (ref 19–32)
Calcium: 9.2 mg/dL (ref 8.4–10.5)
Chloride: 104 mEq/L (ref 96–112)
Creatinine, Ser: 0.74 mg/dL (ref 0.4–1.2)
GFR calc Af Amer: >60 mL/min (ref >60)
GFR calc non Af Amer: >60 mL/min (ref >60)
Glucose, Bld: 130 mg/dL — ABNORMAL HIGH (ref 70–99)
Potassium: 3.4 mEq/L — ABNORMAL LOW (ref 3.5–5.1)
Sodium: 141 mEq/L (ref 135–145)

## 2011-06-15 LAB — CBC
HCT: 39.6 % (ref 36.0–46.0)
Hemoglobin: 13.4 g/dL (ref 12.0–15.0)
MCHC: 33.9 g/dL (ref 30.0–36.0)
MCV: 88.2 fL (ref 78.0–100.0)
Platelets: 198 10*3/uL (ref 150–400)
RBC: 4.49 MIL/uL (ref 3.87–5.11)
RDW: 16.4 % — ABNORMAL HIGH (ref 11.5–15.5)
WBC: 4.9 10*3/uL (ref 4.0–10.5)

## 2011-06-15 LAB — URINE MICROSCOPIC-ADD ON

## 2011-09-13 DIAGNOSIS — E782 Mixed hyperlipidemia: Secondary | ICD-10-CM | POA: Diagnosis not present

## 2011-09-13 DIAGNOSIS — I119 Hypertensive heart disease without heart failure: Secondary | ICD-10-CM | POA: Diagnosis not present

## 2011-09-13 DIAGNOSIS — I2510 Atherosclerotic heart disease of native coronary artery without angina pectoris: Secondary | ICD-10-CM | POA: Diagnosis not present

## 2011-09-14 DIAGNOSIS — E782 Mixed hyperlipidemia: Secondary | ICD-10-CM | POA: Diagnosis not present

## 2011-09-14 DIAGNOSIS — R5383 Other fatigue: Secondary | ICD-10-CM | POA: Diagnosis not present

## 2011-09-14 DIAGNOSIS — R5381 Other malaise: Secondary | ICD-10-CM | POA: Diagnosis not present

## 2011-09-14 DIAGNOSIS — Z79899 Other long term (current) drug therapy: Secondary | ICD-10-CM | POA: Diagnosis not present

## 2011-09-21 DIAGNOSIS — J343 Hypertrophy of nasal turbinates: Secondary | ICD-10-CM | POA: Diagnosis not present

## 2011-09-21 DIAGNOSIS — J019 Acute sinusitis, unspecified: Secondary | ICD-10-CM | POA: Diagnosis not present

## 2011-12-01 ENCOUNTER — Emergency Department (HOSPITAL_COMMUNITY)
Admission: EM | Admit: 2011-12-01 | Discharge: 2011-12-01 | Disposition: A | Discharge status: Home / Self Care | Payer: Medicare Other | Attending: Emergency Medicine | Admitting: Emergency Medicine

## 2011-12-01 ENCOUNTER — Emergency Department (HOSPITAL_COMMUNITY): Payer: Medicare Other

## 2011-12-01 ENCOUNTER — Other Ambulatory Visit: Payer: Self-pay

## 2011-12-01 ENCOUNTER — Encounter (HOSPITAL_COMMUNITY): Payer: Self-pay | Admitting: Emergency Medicine

## 2011-12-01 DIAGNOSIS — R072 Precordial pain: Secondary | ICD-10-CM | POA: Diagnosis not present

## 2011-12-01 DIAGNOSIS — E7800 Pure hypercholesterolemia, unspecified: Secondary | ICD-10-CM | POA: Insufficient documentation

## 2011-12-01 DIAGNOSIS — I452 Bifascicular block: Secondary | ICD-10-CM | POA: Insufficient documentation

## 2011-12-01 DIAGNOSIS — R112 Nausea with vomiting, unspecified: Secondary | ICD-10-CM | POA: Diagnosis not present

## 2011-12-01 DIAGNOSIS — I2510 Atherosclerotic heart disease of native coronary artery without angina pectoris: Secondary | ICD-10-CM | POA: Diagnosis not present

## 2011-12-01 DIAGNOSIS — I10 Essential (primary) hypertension: Secondary | ICD-10-CM | POA: Insufficient documentation

## 2011-12-01 DIAGNOSIS — A088 Other specified intestinal infections: Secondary | ICD-10-CM | POA: Insufficient documentation

## 2011-12-01 DIAGNOSIS — R109 Unspecified abdominal pain: Secondary | ICD-10-CM | POA: Insufficient documentation

## 2011-12-01 DIAGNOSIS — A084 Viral intestinal infection, unspecified: Secondary | ICD-10-CM

## 2011-12-01 DIAGNOSIS — R0789 Other chest pain: Secondary | ICD-10-CM | POA: Diagnosis not present

## 2011-12-01 HISTORY — DX: Gastro-esophageal reflux disease with esophagitis, without bleeding: K21.00

## 2011-12-01 HISTORY — DX: Gastro-esophageal reflux disease with esophagitis: K21.0

## 2011-12-01 HISTORY — DX: Atherosclerotic heart disease of native coronary artery without angina pectoris: I25.10

## 2011-12-01 LAB — COMPREHENSIVE METABOLIC PANEL
ALT: 12 U/L (ref 0–35)
AST: 17 U/L (ref 0–37)
Albumin: 3.5 g/dL (ref 3.5–5.2)
Alkaline Phosphatase: 69 U/L (ref 39–117)
BUN: 11 mg/dL (ref 6–23)
CO2: 24 mEq/L (ref 19–32)
Calcium: 9.4 mg/dL (ref 8.4–10.5)
Chloride: 107 mEq/L (ref 96–112)
Creatinine, Ser: 0.73 mg/dL (ref 0.50–1.10)
GFR calc Af Amer: >90 mL/min (ref >90)
GFR calc non Af Amer: 82 mL/min — ABNORMAL LOW (ref >90)
Glucose, Bld: 118 mg/dL — ABNORMAL HIGH (ref 70–99)
Potassium: 3.4 mEq/L — ABNORMAL LOW (ref 3.5–5.1)
Sodium: 141 mEq/L (ref 135–145)
Total Bilirubin: 0.4 mg/dL (ref 0.3–1.2)
Total Protein: 6.8 g/dL (ref 6.0–8.3)

## 2011-12-01 LAB — URINE MICROSCOPIC-ADD ON

## 2011-12-01 LAB — CBC
HCT: 38 % (ref 36.0–46.0)
Hemoglobin: 13.2 g/dL (ref 12.0–15.0)
MCH: 29.7 pg (ref 26.0–34.0)
MCHC: 34.7 g/dL (ref 30.0–36.0)
MCV: 85.6 fL (ref 78.0–100.0)
Platelets: 187 10*3/uL (ref 150–400)
RBC: 4.44 MIL/uL (ref 3.87–5.11)
RDW: 14.8 % (ref 11.5–15.5)
WBC: 5.2 10*3/uL (ref 4.0–10.5)

## 2011-12-01 LAB — DIFFERENTIAL
Basophils Absolute: 0 10*3/uL (ref 0.0–0.1)
Basophils Relative: 0 % (ref 0–1)
Eosinophils Absolute: 0.1 10*3/uL (ref 0.0–0.7)
Eosinophils Relative: 1 % (ref 0–5)
Lymphocytes Relative: 34 % (ref 12–46)
Lymphs Abs: 1.8 10*3/uL (ref 0.7–4.0)
Monocytes Absolute: 0.5 10*3/uL (ref 0.1–1.0)
Monocytes Relative: 9 % (ref 3–12)
Neutro Abs: 2.9 10*3/uL (ref 1.7–7.7)
Neutrophils Relative %: 56 % (ref 43–77)

## 2011-12-01 LAB — URINALYSIS, ROUTINE W REFLEX MICROSCOPIC
Glucose, UA: NEGATIVE mg/dL
Hgb urine dipstick: NEGATIVE
Ketones, ur: 15 mg/dL — AB
Nitrite: NEGATIVE
Protein, ur: NEGATIVE mg/dL
Specific Gravity, Urine: 1.028 (ref 1.005–1.030)
Urobilinogen, UA: 1 mg/dL (ref 0.0–1.0)
pH: 6 (ref 5.0–8.0)

## 2011-12-01 LAB — D-DIMER, QUANTITATIVE: D-Dimer, Quant: 0.48 ug/mL-FEU (ref 0.00–0.48)

## 2011-12-01 LAB — PRO B NATRIURETIC PEPTIDE: Pro B Natriuretic peptide (BNP): 67.9 pg/mL (ref 0–450)

## 2011-12-01 LAB — CARDIAC PANEL(CRET KIN+CKTOT+MB+TROPI)
CK, MB: 2.1 ng/mL (ref 0.3–4.0)
Relative Index: 1.7 (ref 0.0–2.5)
Total CK: 127 U/L (ref 7–177)
Troponin I: <0.3 ng/mL (ref <0.30)

## 2011-12-01 LAB — LIPASE, BLOOD: Lipase: 10 U/L — ABNORMAL LOW (ref 11–59)

## 2011-12-01 MED ORDER — ONDANSETRON HCL 4 MG/2ML IJ SOLN
4.0000 mg | Freq: Once | INTRAMUSCULAR | Status: AC
  Administered 2011-12-01: 4 mg via INTRAVENOUS
  Filled 2011-12-01: qty 2

## 2011-12-01 MED ORDER — SODIUM CHLORIDE 0.9 % IV SOLN
INTRAVENOUS | Status: DC
  Administered 2011-12-01: 11:00:00 via INTRAVENOUS

## 2011-12-01 MED ORDER — ONDANSETRON 4 MG PO TBDP: 4.0000 mg | ORAL_TABLET | Freq: Three times a day (TID) | ORAL | Status: AC | PRN

## 2011-12-01 MED ORDER — ONDANSETRON 4 MG PO TBDP: 4.0000 mg | ORAL_TABLET | Freq: Three times a day (TID) | ORAL | Status: DC | PRN

## 2011-12-01 MED ORDER — MORPHINE SULFATE 4 MG/ML IJ SOLN
4.0000 mg | Freq: Once | INTRAMUSCULAR | Status: DC
  Filled 2011-12-01: qty 1

## 2011-12-01 NOTE — ED Provider Notes (Signed)
History     CSN: [id number]  Arrival date & time 12/01/11  4098   First MD Initiated Contact with Patient 12/01/11 [phone]      Chief Complaint  Patient presents with  . Emesis    (Consider location/radiation/quality/duration/timing/severity/associated sxs/prior treatment) HPI Comments: Patient is a 78 year old woman who abdominal pain nausea and vomiting intermittently since last Sunday, 6 days ago. She was seen by her primary care physician, Assunta Found M.D. This morning, and he advised her to go to the hospital because she had epigastric pain that went up into her chest. She has a prior history of coronary artery disease, and has had heart attack with stenting about 5 years ago. She is currently on Plavix and aspirin. She is seen for her heart condition at [facility] heart and vascular.  Patient is a 78 y.o. female presenting with vomiting. The history is provided by the patient and medical records. No language interpreter was used.  Emesis  This is a new problem. The current episode started more than 2 days ago. The problem occurs 2 to 4 times per day. The problem has not changed since onset.The emesis has an appearance of stomach contents. There has been no fever. Associated symptoms include abdominal pain and diarrhea. Pertinent negatives include no chills and no fever.    Past Medical History  Diagnosis Date  . Coronary artery disease   . Hypertension   . High cholesterol   . Reflux esophagitis     No past surgical history on file.  No family history on file.  History  Substance Use Topics  . Smoking status: Not on file  . Smokeless tobacco: Not on file  . Alcohol Use: No    OB History    Grav Para Term Preterm Abortions TAB SAB Ect Mult Living                  Review of Systems  Constitutional: Negative.  Negative for fever and chills.  HENT: Negative.   Eyes: Negative.   Respiratory: Negative.   Cardiovascular: Positive for chest pain.  Gastrointestinal:  Positive for nausea, vomiting, abdominal pain and diarrhea.  Genitourinary: Negative.   Musculoskeletal: Negative.   Skin: Negative.   Neurological: Negative.   Psychiatric/Behavioral: Negative.     Allergies  Codeine; Contrast media; and Other  Home Medications   Current Outpatient Rx  Name Route Sig Dispense Refill  . CARVEDILOL 12.5 MG PO TABS Oral Take 12.5 mg by mouth 2 (two) times daily with a meal.    . CLOPIDOGREL BISULFATE 75 MG PO TABS Oral Take 75 mg by mouth daily.    Marland Kitchen ESOMEPRAZOLE MAGNESIUM 40 MG PO CPDR Oral Take 40 mg by mouth daily before breakfast.    . ROSUVASTATIN CALCIUM 20 MG PO TABS Oral Take 20 mg by mouth daily.      BP 188/94  Pulse 69  Temp(Src) 97.9 F (36.6 C) (Oral)  Resp 16  SpO2 98%  Physical Exam  Nursing note and vitals reviewed. Constitutional: She is oriented to person, place, and time.       Obese elderly lady in moderate distress, complaining of chest pain, abdominal pain, nausea and vomiting.  HENT:  Head: Normocephalic and atraumatic.  Right Ear: External ear normal.  Left Ear: External ear normal.  Mouth/Throat: Oropharynx is clear and moist.  Eyes: EOM are normal. Pupils are equal, round, and reactive to light.  Neck: Normal range of motion. Neck supple.  Cardiovascular: Normal rate, regular rhythm  and normal heart sounds.   Pulmonary/Chest: Effort normal and breath sounds normal.  Abdominal: Soft. Bowel sounds are normal.  Musculoskeletal: Normal range of motion. She exhibits no edema and no tenderness.  Neurological: She is alert and oriented to person, place, and time.       No sensory or motor deficit.  Skin: Skin is warm and dry.  Psychiatric: She has a normal mood and affect. Her behavior is normal.    ED Course  Procedures (including critical care time)  9:59 AM  Date: 12/01/2011  Rate: 72  Rhythm: normal sinus rhythm  QRS Axis: left  Intervals: normal  ST/T Wave abnormalities: normal  Conduction  Disutrbances:right bundle branch block and left anterior fascicular block  Narrative Interpretation: Abnormal EKG  Old EKG Reviewed: unchanged  Course in the ED: Patient was seen and had physical examination. Old charts were reviewed. Laboratory and x-ray testing was performed.  1:03 PM Complaining of abdominal pain --> morphine, Zofran ordered.   Results for orders placed during the hospital encounter of 12/01/11  CBC      Component Value Range   WBC 5.2  4.0 - 10.5 (K/uL)   RBC 4.44  3.87 - 5.11 (MIL/uL)   Hemoglobin 13.2  12.0 - 15.0 (g/dL)   HCT 19.1  47.8 - 29.5 (%)   MCV 85.6  78.0 - 100.0 (fL)   MCH 29.7  26.0 - 34.0 (pg)   MCHC 34.7  30.0 - 36.0 (g/dL)   RDW 62.1  30.8 - 65.7 (%)   Platelets 187  150 - 400 (K/uL)  DIFFERENTIAL      Component Value Range   Neutrophils Relative 56  43 - 77 (%)   Neutro Abs 2.9  1.7 - 7.7 (K/uL)   Lymphocytes Relative 34  12 - 46 (%)   Lymphs Abs 1.8  0.7 - 4.0 (K/uL)   Monocytes Relative 9  3 - 12 (%)   Monocytes Absolute 0.5  0.1 - 1.0 (K/uL)   Eosinophils Relative 1  0 - 5 (%)   Eosinophils Absolute 0.1  0.0 - 0.7 (K/uL)   Basophils Relative 0  0 - 1 (%)   Basophils Absolute 0.0  0.0 - 0.1 (K/uL)  COMPREHENSIVE METABOLIC PANEL      Component Value Range   Sodium 141  135 - 145 (mEq/L)   Potassium 3.4 (*) 3.5 - 5.1 (mEq/L)   Chloride 107  96 - 112 (mEq/L)   CO2 24  19 - 32 (mEq/L)   Glucose, Bld 118 (*) 70 - 99 (mg/dL)   BUN 11  6 - 23 (mg/dL)   Creatinine, Ser 8.46  0.50 - 1.10 (mg/dL)   Calcium 9.4  8.4 - 96.2 (mg/dL)   Total Protein 6.8  6.0 - 8.3 (g/dL)   Albumin 3.5  3.5 - 5.2 (g/dL)   AST 17  0 - 37 (U/L)   ALT 12  0 - 35 (U/L)   Alkaline Phosphatase 69  39 - 117 (U/L)   Total Bilirubin 0.4  0.3 - 1.2 (mg/dL)   GFR calc non Af Amer 82 (*) >90 (mL/min)   GFR calc Af Amer >90  >90 (mL/min)  LIPASE, BLOOD      Component Value Range   Lipase 10 (*) 11 - 59 (U/L)  URINALYSIS, ROUTINE W REFLEX MICROSCOPIC      Component  Value Range   Color, Urine AMBER (*) YELLOW    APPearance CLOUDY (*) CLEAR    Specific Gravity, Urine  1.028  1.005 - 1.030    pH 6.0  5.0 - 8.0    Glucose, UA NEGATIVE  NEGATIVE (mg/dL)   Hgb urine dipstick NEGATIVE  NEGATIVE    Bilirubin Urine SMALL (*) NEGATIVE    Ketones, ur 15 (*) NEGATIVE (mg/dL)   Protein, ur NEGATIVE  NEGATIVE (mg/dL)   Urobilinogen, UA 1.0  0.0 - 1.0 (mg/dL)   Nitrite NEGATIVE  NEGATIVE    Leukocytes, UA SMALL (*) NEGATIVE   CARDIAC PANEL(CRET KIN+CKTOT+MB+TROPI)      Component Value Range   Total CK 127  7 - 177 (U/L)   CK, MB 2.1  0.3 - 4.0 (ng/mL)   Troponin I <0.30  <0.30 (ng/mL)   Relative Index 1.7  0.0 - 2.5   D-DIMER, QUANTITATIVE      Component Value Range   D-Dimer, Quant 0.48  0.00 - 0.48 (ug/mL-FEU)  PRO B NATRIURETIC PEPTIDE      Component Value Range   Pro B Natriuretic peptide (BNP) 67.9  0 - 450 (pg/mL)  URINE MICROSCOPIC-ADD ON      Component Value Range   Squamous Epithelial / LPF FEW (*) RARE    WBC, UA 3-6  <3 (WBC/hpf)   Bacteria, UA RARE  RARE    Dg Abd Acute W/chest  12/01/2011  *RADIOLOGY REPORT*  Clinical Data: Substernal chest pain, abdominal pain, nausea/vomiting  ACUTE ABDOMEN SERIES (ABDOMEN 2 VIEW & CHEST 1 VIEW)  Comparison: CT abdomen pelvis dated 09/24/2006  Findings: Lungs are essentially clear. No pleural effusion or pneumothorax.  Cardiomediastinal silhouette is within normal limits.  Nonobstructive bowel gas pattern.  Moderate stool throughout the colon.  No evidence of free air under the diaphragm on the upright view.  Cholecystectomy clips.  Mild degenerative changes of the visualized thoracolumbar spine.  IMPRESSION: No evidence of acute cardiopulmonary disease.  No evidence of small bowel obstruction or free air.  Moderate stool throughout the colon.  Original Report Authenticated By: Charline Bills, M.D.    1:56 PM Lab workup was reassuringly negative.  Rx Zofran, light diet.  1. Viral gastroenteritis            Carleene Cooper III, MD 12/01/11 1401

## 2011-12-01 NOTE — ED Notes (Signed)
Pt. Stated, I started throwing up last sun a lot and Wed. It sorted went away and came back yesterday.  Went to see Dr. And told the symptoms were more than just a virus. I've ben sweating a lot and some chest pain with SOB

## 2011-12-04 LAB — URINE CULTURE
Colony Count: >=100000
Culture  Setup Time: 201303232157

## 2011-12-05 NOTE — ED Notes (Signed)
+   Urine Chart sent to EDP office for review. 

## 2011-12-06 NOTE — ED Notes (Signed)
Chart returned from EDP office. Prescribed Cipro 250 mg. 1 PO BID x 5 days. # 10. No refills. Prescribed by S. Wolfe PA-C.

## 2011-12-07 NOTE — ED Notes (Signed)
RX called in to [facility] in [place] [phone]). RX called in by [facility].

## 2011-12-07 NOTE — ED Notes (Signed)
Called patient and informed them of new RX. Wants RX called to [facility] in [place] [phone]).

## 2011-12-17 DIAGNOSIS — R05 Cough: Secondary | ICD-10-CM | POA: Diagnosis not present

## 2011-12-17 DIAGNOSIS — M25569 Pain in unspecified knee: Secondary | ICD-10-CM | POA: Diagnosis not present

## 2011-12-17 DIAGNOSIS — R059 Cough, unspecified: Secondary | ICD-10-CM | POA: Diagnosis not present

## 2012-02-26 DIAGNOSIS — I4510 Unspecified right bundle-branch block: Secondary | ICD-10-CM | POA: Diagnosis not present

## 2012-02-26 DIAGNOSIS — I119 Hypertensive heart disease without heart failure: Secondary | ICD-10-CM | POA: Diagnosis not present

## 2012-02-28 DIAGNOSIS — Z6838 Body mass index (BMI) 38.0-38.9, adult: Secondary | ICD-10-CM | POA: Diagnosis not present

## 2012-02-28 DIAGNOSIS — M159 Polyosteoarthritis, unspecified: Secondary | ICD-10-CM | POA: Diagnosis not present

## 2012-03-09 ENCOUNTER — Encounter (HOSPITAL_COMMUNITY): Payer: Self-pay | Admitting: *Deleted

## 2012-03-09 ENCOUNTER — Emergency Department (HOSPITAL_COMMUNITY)
Admission: EM | Admit: 2012-03-09 | Discharge: 2012-03-10 | Disposition: A | Discharge status: Home / Self Care | Payer: Medicare Other | Attending: Emergency Medicine | Admitting: Emergency Medicine

## 2012-03-09 ENCOUNTER — Other Ambulatory Visit: Payer: Self-pay

## 2012-03-09 DIAGNOSIS — I10 Essential (primary) hypertension: Secondary | ICD-10-CM | POA: Insufficient documentation

## 2012-03-09 DIAGNOSIS — R5383 Other fatigue: Secondary | ICD-10-CM | POA: Diagnosis not present

## 2012-03-09 DIAGNOSIS — Z79899 Other long term (current) drug therapy: Secondary | ICD-10-CM | POA: Diagnosis not present

## 2012-03-09 DIAGNOSIS — R5381 Other malaise: Secondary | ICD-10-CM | POA: Insufficient documentation

## 2012-03-09 DIAGNOSIS — R0789 Other chest pain: Secondary | ICD-10-CM | POA: Insufficient documentation

## 2012-03-09 DIAGNOSIS — R110 Nausea: Secondary | ICD-10-CM | POA: Insufficient documentation

## 2012-03-09 DIAGNOSIS — R42 Dizziness and giddiness: Secondary | ICD-10-CM | POA: Diagnosis not present

## 2012-03-09 DIAGNOSIS — R079 Chest pain, unspecified: Secondary | ICD-10-CM | POA: Diagnosis not present

## 2012-03-09 DIAGNOSIS — I2510 Atherosclerotic heart disease of native coronary artery without angina pectoris: Secondary | ICD-10-CM | POA: Diagnosis not present

## 2012-03-09 MED ORDER — ONDANSETRON HCL 4 MG/2ML IJ SOLN
4.0000 mg | Freq: Once | INTRAMUSCULAR | Status: AC
  Administered 2012-03-10: 4 mg via INTRAVENOUS
  Filled 2012-03-09: qty 2

## 2012-03-09 MED ORDER — SODIUM CHLORIDE 0.9 % IV SOLN
Freq: Once | INTRAVENOUS | Status: AC
  Administered 2012-03-10: via INTRAVENOUS

## 2012-03-09 NOTE — ED Notes (Addendum)
Pt c/o n/v, light headed, clammy, and chest tightness and abdominal pain. Pt states "I just don't feel good."

## 2012-03-10 ENCOUNTER — Emergency Department (HOSPITAL_COMMUNITY): Payer: Medicare Other

## 2012-03-10 DIAGNOSIS — R079 Chest pain, unspecified: Secondary | ICD-10-CM | POA: Diagnosis not present

## 2012-03-10 LAB — BASIC METABOLIC PANEL
BUN: 11 mg/dL (ref 6–23)
CO2: 26 mEq/L (ref 19–32)
Calcium: 9.4 mg/dL (ref 8.4–10.5)
Chloride: 102 mEq/L (ref 96–112)
Creatinine, Ser: 0.77 mg/dL (ref 0.50–1.10)
GFR calc Af Amer: >90 mL/min (ref >90)
GFR calc non Af Amer: 80 mL/min — ABNORMAL LOW (ref >90)
Glucose, Bld: 155 mg/dL — ABNORMAL HIGH (ref 70–99)
Potassium: 3.6 mEq/L (ref 3.5–5.1)
Sodium: 137 mEq/L (ref 135–145)

## 2012-03-10 LAB — CBC WITH DIFFERENTIAL/PLATELET
Basophils Absolute: 0 10*3/uL (ref 0.0–0.1)
Basophils Relative: 0 % (ref 0–1)
Eosinophils Absolute: 0.1 10*3/uL (ref 0.0–0.7)
Eosinophils Relative: 1 % (ref 0–5)
HCT: 35.8 % — ABNORMAL LOW (ref 36.0–46.0)
Hemoglobin: 12.1 g/dL (ref 12.0–15.0)
Lymphocytes Relative: 35 % (ref 12–46)
Lymphs Abs: 2.2 10*3/uL (ref 0.7–4.0)
MCH: 28.5 pg (ref 26.0–34.0)
MCHC: 33.8 g/dL (ref 30.0–36.0)
MCV: 84.2 fL (ref 78.0–100.0)
Monocytes Absolute: 0.5 10*3/uL (ref 0.1–1.0)
Monocytes Relative: 8 % (ref 3–12)
Neutro Abs: 3.5 10*3/uL (ref 1.7–7.7)
Neutrophils Relative %: 56 % (ref 43–77)
Platelets: 205 10*3/uL (ref 150–400)
RBC: 4.25 MIL/uL (ref 3.87–5.11)
RDW: 14.5 % (ref 11.5–15.5)
WBC: 6.2 10*3/uL (ref 4.0–10.5)

## 2012-03-10 LAB — TROPONIN I: Troponin I: <0.3 ng/mL (ref <0.30)

## 2012-03-10 MED ORDER — MECLIZINE HCL 12.5 MG PO TABS
25.0000 mg | ORAL_TABLET | Freq: Once | ORAL | Status: AC
  Administered 2012-03-10: 25 mg via ORAL
  Filled 2012-03-10: qty 2

## 2012-03-10 MED ORDER — MECLIZINE HCL 25 MG PO TABS: 25.0000 mg | ORAL_TABLET | Freq: Two times a day (BID) | ORAL | Status: AC

## 2012-03-10 NOTE — Discharge Instructions (Signed)
Your blood work including your heart numbers were normal here tonight. Your EKG and chest xray were normal for you. You were given antivert for the dizziness. Take all of the prescription. Make position changes slowly. Follow up with your doctor.If you continue to have dizziness despite the medicine or develop other symptoms, return to the Emergency Department.

## 2012-03-10 NOTE — ED Provider Notes (Signed)
History     CSN: [id number]  Arrival date & time 03/09/12  2321   First MD Initiated Contact with Patient 03/09/12 2356      Chief Complaint  Patient presents with  . Nausea  . Emesis  . Dizziness  . Chest Pain  . Abdominal Pain    (Consider location/radiation/quality/duration/timing/severity/associated sxs/prior treatment) HPI [name] is a 78 y.o. female with a h/o CAD s/p PTCA with stent, HTN ,  who presents to the Emergency Department complaining of sudden onset of dizziness at 1830 associated with nausea, feeling clammy and experiencing chest tightness. Dizziness is characterized as surrounding spinning. Better with eyes closed and increases with change in position and with movement of head. Denies recent illness, cough, shortness of breath, fever, chills, vision changes, hearing changes, difficulty speaking or swallowing, extremity weakness.   PCP Dr. Phillips Odor   Past Medical History  Diagnosis Date  . Coronary artery disease   . Hypertension   . High cholesterol   . Reflux esophagitis     Past Surgical History  Procedure Date  . Cholecystectomy   . Coronary angioplasty with stent placement     History reviewed. No pertinent family history.  History  Substance Use Topics  . Smoking status: Not on file  . Smokeless tobacco: Not on file  . Alcohol Use: No    OB History    Grav Para Term Preterm Abortions TAB SAB Ect Mult Living                  Review of Systems  Constitutional: Positive for diaphoresis. Negative for fever.       10 Systems reviewed and are negative for acute change except as noted in the HPI.  HENT: Negative for congestion.   Eyes: Negative for discharge and redness.  Respiratory: Positive for chest tightness. Negative for cough and shortness of breath.   Cardiovascular: Negative for chest pain.  Gastrointestinal: Positive for nausea. Negative for vomiting and abdominal pain.  Musculoskeletal: Negative for back pain.  Skin:  Negative for rash.  Neurological: Positive for dizziness. Negative for syncope, numbness and headaches.  Psychiatric/Behavioral:       No behavior change.    Allergies  Codeine; Contrast media; and Other  Home Medications   Current Outpatient Rx  Name Route Sig Dispense Refill  . ASPIRIN 81 MG PO TABS Oral Take 81 mg by mouth daily.    Marland Kitchen CARVEDILOL 12.5 MG PO TABS Oral Take 25 mg by mouth 2 (two) times daily with a meal.     . CLOPIDOGREL BISULFATE 75 MG PO TABS Oral Take 75 mg by mouth daily.    Marland Kitchen ESOMEPRAZOLE MAGNESIUM 40 MG PO CPDR Oral Take 40 mg by mouth daily before breakfast.    . NITROGLYCERIN 0.4 MG SL SUBL Sublingual Place 0.4 mg under the tongue every 5 (five) minutes as needed.    Marland Kitchen ROSUVASTATIN CALCIUM 20 MG PO TABS Oral Take 20 mg by mouth daily.      BP 147/68  Pulse 65  Resp 20  SpO2 99%  Physical Exam  Nursing note and vitals reviewed. Constitutional: She appears well-developed and well-nourished.       Awake, alert, nontoxic appearance.  HENT:  Head: Normocephalic and atraumatic.  Right Ear: External ear normal.  Left Ear: External ear normal.  Nose: Nose normal.  Mouth/Throat: Oropharynx is clear and moist.  Eyes: Conjunctivae and EOM are normal. Pupils are equal, round, and reactive to light. Right eye  exhibits no discharge. Left eye exhibits no discharge.  Neck: Neck supple.  Cardiovascular: Normal rate, normal heart sounds and intact distal pulses.   Pulmonary/Chest: Effort normal and breath sounds normal. She exhibits no tenderness.  Abdominal: Soft. Bowel sounds are normal. There is no tenderness. There is no rebound.  Musculoskeletal: Normal range of motion. She exhibits no tenderness.       Baseline ROM, no obvious new focal weakness.  Neurological:       Mental status and motor strength appears baseline for patient and situation. Vertiginous with head movement.   Major CN grossly intact.  Strength 5/5 equal bilat UE's and LE's.  DTR 2/4 equal  bilat UE's and LE's.  No gross sensory deficits.  Normal cerebellar testing bilat UE's and LE's.  No pronator drift.  Speech clear.  No facial droop.  No nystagmus.   Skin: No rash noted.  Psychiatric: She has a normal mood and affect.    ED Course  Procedures (including critical care time) Results for orders placed during the hospital encounter of 03/09/12  CBC WITH DIFFERENTIAL      Component Value Range   WBC 6.2  4.0 - 10.5 K/uL   RBC 4.25  3.87 - 5.11 MIL/uL   Hemoglobin 12.1  12.0 - 15.0 g/dL   HCT 14.7 (*) 82.9 - 56.2 %   MCV 84.2  78.0 - 100.0 fL   MCH 28.5  26.0 - 34.0 pg   MCHC 33.8  30.0 - 36.0 g/dL   RDW 13.0  86.5 - 78.4 %   Platelets 205  150 - 400 K/uL   Neutrophils Relative 56  43 - 77 %   Neutro Abs 3.5  1.7 - 7.7 K/uL   Lymphocytes Relative 35  12 - 46 %   Lymphs Abs 2.2  0.7 - 4.0 K/uL   Monocytes Relative 8  3 - 12 %   Monocytes Absolute 0.5  0.1 - 1.0 K/uL   Eosinophils Relative 1  0 - 5 %   Eosinophils Absolute 0.1  0.0 - 0.7 K/uL   Basophils Relative 0  0 - 1 %   Basophils Absolute 0.0  0.0 - 0.1 K/uL  BASIC METABOLIC PANEL      Component Value Range   Sodium 137  135 - 145 mEq/L   Potassium 3.6  3.5 - 5.1 mEq/L   Chloride 102  96 - 112 mEq/L   CO2 26  19 - 32 mEq/L   Glucose, Bld 155 (*) 70 - 99 mg/dL   BUN 11  6 - 23 mg/dL   Creatinine, Ser 6.96  0.50 - 1.10 mg/dL   Calcium 9.4  8.4 - 29.5 mg/dL   GFR calc non Af Amer 80 (*) >90 mL/min   GFR calc Af Amer >90  >90 mL/min  TROPONIN I      Component Value Range   Troponin I <0.30  <0.30 ng/mL    Dg Chest Port 1 View  03/10/2012  *RADIOLOGY REPORT*  Clinical Data: Chest pain.  PORTABLE CHEST - 1 VIEW  Comparison: 08/23/2008  Findings: Mild cardiomegaly.  Mild hyperinflation of the lungs.  No focal airspace opacities or effusions.  No acute bony abnormality.  IMPRESSION: Mild hyperinflation and cardiomegaly.  No acute findings.  Original Report Authenticated By: Cyndie Chime, M.D.     Date:03/09/2012   2333  Rate: 60  Rhythm: normal sinus rhythm  QRS Axis: normal  Intervals: normal  ST/T Wave abnormalities: normal  Conduction  Disutrbances:right bundle branch block and left anterior fascicular block  Narrative Interpretation:   Old EKG Reviewed: unchanged c/w 12/01/2011      MDM  Patient with sudden onset of vertigo associated with nausea, diaphoresis and chest tightness.Labs are unremarkable, troponin negative, EKG unchanged, chest xray without acute findings. Patient was given IVF, antiemetic, and antivert. The first time she ambulated she felt dizzy after being up and in the hallway requesting a wheelchair to return to her room. She was able to ambulate to the bathroom with minimal dizziness. She took PO fluids. She requested discharge home.  Pt feels improved after observation and/or treatment in ED.Pt stable in ED with no significant deterioration in condition.The patient appears reasonably screened and/or stabilized for discharge and I doubt any other medical condition or other [facility] requiring further screening, evaluation, or treatment in the ED at this time prior to discharge.  MDM Reviewed: nursing note and vitals Interpretation: labs, ECG and x-ray           Nicoletta Dress. Colon Branch, MD 03/10/12 2100

## 2012-03-10 NOTE — ED Notes (Signed)
Ambulated patient. Patient stated "I am a little dizzy and requested a wheelchair." Patient transferred back to bed via wheelchair.

## 2012-03-10 NOTE — ED Notes (Signed)
Patient stated "I feel fine and I want to go home. I am still a little dizzy but I feel better." Advised Dr Colon Branch.

## 2012-03-24 ENCOUNTER — Other Ambulatory Visit (HOSPITAL_COMMUNITY): Payer: Self-pay | Admitting: Family Medicine

## 2012-03-24 DIAGNOSIS — Z139 Encounter for screening, unspecified: Secondary | ICD-10-CM

## 2012-04-10 ENCOUNTER — Ambulatory Visit (HOSPITAL_COMMUNITY)
Admission: RE | Admit: 2012-04-10 | Discharge: 2012-04-10 | Disposition: A | Discharge status: Home / Self Care | Payer: Medicare Other | Source: Ambulatory Visit | Attending: Family Medicine | Admitting: Family Medicine

## 2012-04-10 DIAGNOSIS — Z1231 Encounter for screening mammogram for malignant neoplasm of breast: Secondary | ICD-10-CM | POA: Insufficient documentation

## 2012-04-10 DIAGNOSIS — Z139 Encounter for screening, unspecified: Secondary | ICD-10-CM

## 2012-05-08 DIAGNOSIS — M25569 Pain in unspecified knee: Secondary | ICD-10-CM | POA: Diagnosis not present

## 2012-05-08 DIAGNOSIS — M159 Polyosteoarthritis, unspecified: Secondary | ICD-10-CM | POA: Diagnosis not present

## 2012-05-14 DIAGNOSIS — IMO0002 Reserved for concepts with insufficient information to code with codable children: Secondary | ICD-10-CM | POA: Diagnosis not present

## 2012-05-14 DIAGNOSIS — M1710 Unilateral primary osteoarthritis, unspecified knee: Secondary | ICD-10-CM | POA: Diagnosis not present

## 2012-05-16 DIAGNOSIS — E669 Obesity, unspecified: Secondary | ICD-10-CM | POA: Diagnosis not present

## 2012-05-16 DIAGNOSIS — I119 Hypertensive heart disease without heart failure: Secondary | ICD-10-CM | POA: Diagnosis not present

## 2012-05-16 DIAGNOSIS — I2510 Atherosclerotic heart disease of native coronary artery without angina pectoris: Secondary | ICD-10-CM | POA: Diagnosis not present

## 2012-06-16 DIAGNOSIS — M1710 Unilateral primary osteoarthritis, unspecified knee: Secondary | ICD-10-CM | POA: Diagnosis not present

## 2012-06-16 DIAGNOSIS — IMO0002 Reserved for concepts with insufficient information to code with codable children: Secondary | ICD-10-CM | POA: Diagnosis not present

## 2012-06-23 DIAGNOSIS — IMO0002 Reserved for concepts with insufficient information to code with codable children: Secondary | ICD-10-CM | POA: Diagnosis not present

## 2012-06-23 DIAGNOSIS — M1710 Unilateral primary osteoarthritis, unspecified knee: Secondary | ICD-10-CM | POA: Diagnosis not present

## 2012-06-30 DIAGNOSIS — M1710 Unilateral primary osteoarthritis, unspecified knee: Secondary | ICD-10-CM | POA: Diagnosis not present

## 2012-06-30 DIAGNOSIS — IMO0002 Reserved for concepts with insufficient information to code with codable children: Secondary | ICD-10-CM | POA: Diagnosis not present

## 2012-07-07 DIAGNOSIS — M1710 Unilateral primary osteoarthritis, unspecified knee: Secondary | ICD-10-CM | POA: Diagnosis not present

## 2012-07-07 DIAGNOSIS — IMO0002 Reserved for concepts with insufficient information to code with codable children: Secondary | ICD-10-CM | POA: Diagnosis not present

## 2012-07-14 DIAGNOSIS — M1710 Unilateral primary osteoarthritis, unspecified knee: Secondary | ICD-10-CM | POA: Diagnosis not present

## 2012-07-14 DIAGNOSIS — IMO0002 Reserved for concepts with insufficient information to code with codable children: Secondary | ICD-10-CM | POA: Diagnosis not present

## 2012-07-21 DIAGNOSIS — R0602 Shortness of breath: Secondary | ICD-10-CM | POA: Diagnosis not present

## 2012-07-21 DIAGNOSIS — N390 Urinary tract infection, site not specified: Secondary | ICD-10-CM | POA: Diagnosis not present

## 2012-07-21 DIAGNOSIS — Z6836 Body mass index (BMI) 36.0-36.9, adult: Secondary | ICD-10-CM | POA: Diagnosis not present

## 2012-08-15 DIAGNOSIS — I4510 Unspecified right bundle-branch block: Secondary | ICD-10-CM | POA: Diagnosis not present

## 2012-08-15 DIAGNOSIS — I2510 Atherosclerotic heart disease of native coronary artery without angina pectoris: Secondary | ICD-10-CM | POA: Diagnosis not present

## 2012-08-15 DIAGNOSIS — I119 Hypertensive heart disease without heart failure: Secondary | ICD-10-CM | POA: Diagnosis not present

## 2012-08-15 DIAGNOSIS — E669 Obesity, unspecified: Secondary | ICD-10-CM | POA: Diagnosis not present

## 2012-09-08 DIAGNOSIS — M1710 Unilateral primary osteoarthritis, unspecified knee: Secondary | ICD-10-CM | POA: Diagnosis not present

## 2012-09-08 DIAGNOSIS — IMO0002 Reserved for concepts with insufficient information to code with codable children: Secondary | ICD-10-CM | POA: Diagnosis not present

## 2012-10-20 DIAGNOSIS — IMO0002 Reserved for concepts with insufficient information to code with codable children: Secondary | ICD-10-CM | POA: Diagnosis not present

## 2012-10-20 DIAGNOSIS — M1710 Unilateral primary osteoarthritis, unspecified knee: Secondary | ICD-10-CM | POA: Diagnosis not present

## 2012-11-18 DIAGNOSIS — H01029 Squamous blepharitis unspecified eye, unspecified eyelid: Secondary | ICD-10-CM | POA: Diagnosis not present

## 2012-11-18 DIAGNOSIS — H00029 Hordeolum internum unspecified eye, unspecified eyelid: Secondary | ICD-10-CM | POA: Diagnosis not present

## 2012-12-05 DIAGNOSIS — H01029 Squamous blepharitis unspecified eye, unspecified eyelid: Secondary | ICD-10-CM | POA: Diagnosis not present

## 2012-12-05 DIAGNOSIS — H2510 Age-related nuclear cataract, unspecified eye: Secondary | ICD-10-CM | POA: Diagnosis not present

## 2012-12-08 ENCOUNTER — Encounter: Payer: Self-pay | Admitting: *Deleted

## 2012-12-31 ENCOUNTER — Other Ambulatory Visit (HOSPITAL_COMMUNITY): Payer: Self-pay | Admitting: Physician Assistant

## 2012-12-31 DIAGNOSIS — E785 Hyperlipidemia, unspecified: Secondary | ICD-10-CM | POA: Diagnosis not present

## 2012-12-31 DIAGNOSIS — M159 Polyosteoarthritis, unspecified: Secondary | ICD-10-CM

## 2012-12-31 DIAGNOSIS — Z6836 Body mass index (BMI) 36.0-36.9, adult: Secondary | ICD-10-CM | POA: Diagnosis not present

## 2012-12-31 DIAGNOSIS — I10 Essential (primary) hypertension: Secondary | ICD-10-CM | POA: Diagnosis not present

## 2012-12-31 DIAGNOSIS — R7309 Other abnormal glucose: Secondary | ICD-10-CM | POA: Diagnosis not present

## 2012-12-31 DIAGNOSIS — I2510 Atherosclerotic heart disease of native coronary artery without angina pectoris: Secondary | ICD-10-CM | POA: Diagnosis not present

## 2013-01-06 ENCOUNTER — Ambulatory Visit (HOSPITAL_COMMUNITY)
Admission: RE | Admit: 2013-01-06 | Discharge: 2013-01-06 | Disposition: A | Discharge status: Home / Self Care | Payer: Medicare Other | Source: Ambulatory Visit | Attending: Physician Assistant | Admitting: Physician Assistant

## 2013-01-06 DIAGNOSIS — Z13820 Encounter for screening for osteoporosis: Secondary | ICD-10-CM | POA: Insufficient documentation

## 2013-01-06 DIAGNOSIS — M159 Polyosteoarthritis, unspecified: Secondary | ICD-10-CM

## 2013-01-06 DIAGNOSIS — Z780 Asymptomatic menopausal state: Secondary | ICD-10-CM | POA: Insufficient documentation

## 2013-01-06 DIAGNOSIS — M810 Age-related osteoporosis without current pathological fracture: Secondary | ICD-10-CM | POA: Diagnosis not present

## 2013-01-06 DIAGNOSIS — M818 Other osteoporosis without current pathological fracture: Secondary | ICD-10-CM | POA: Insufficient documentation

## 2013-01-06 IMAGING — CR DG ABDOMEN ACUTE W/ 1V CHEST
3 series · 3 of 3 positions shown · non-contrast
Comparison: CT abdomen pelvis dated 09/24/2006

CLINICAL DATA: Substernal chest pain, abdominal pain,
nausea/vomiting

ACUTE ABDOMEN SERIES (ABDOMEN 2 VIEW & CHEST 1 VIEW)

[w chest pa]
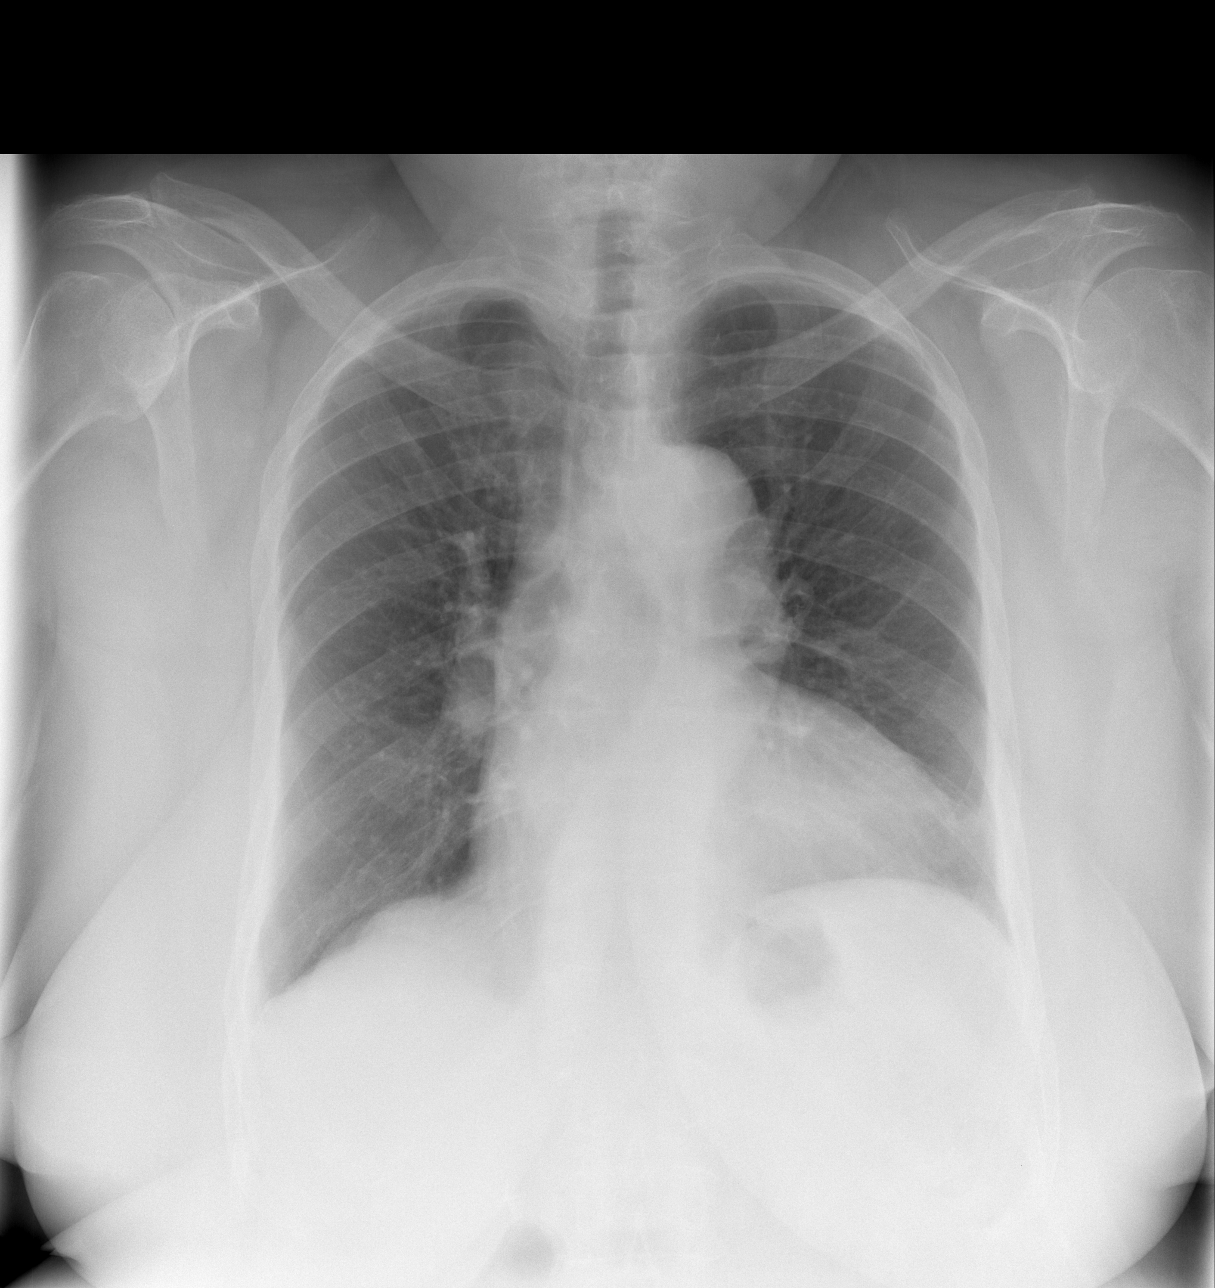

[w abdomen upright]
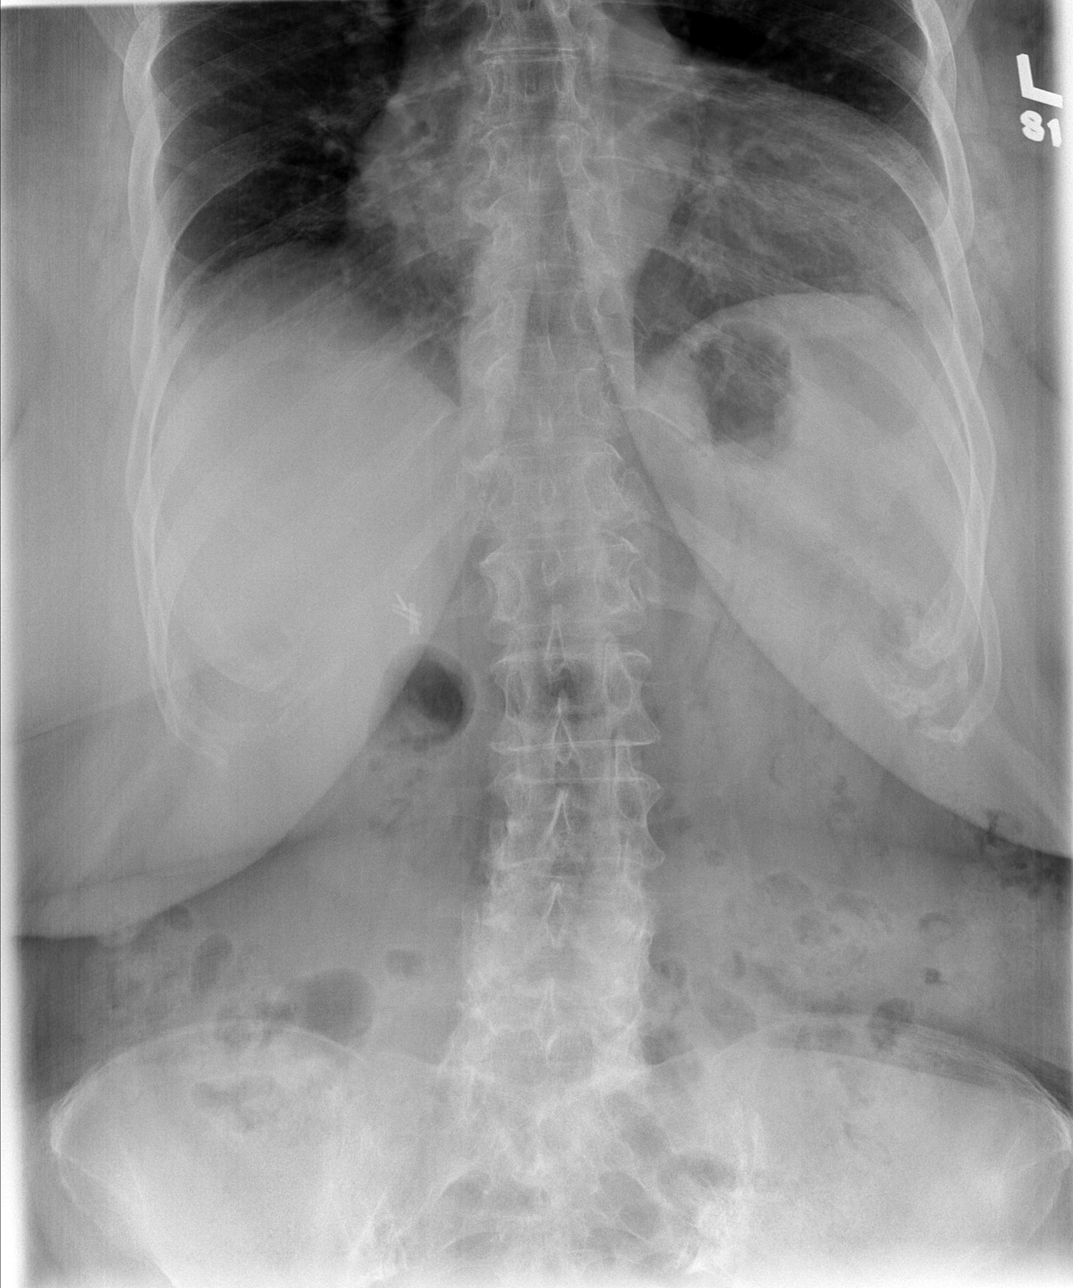

[t abdomen supine]
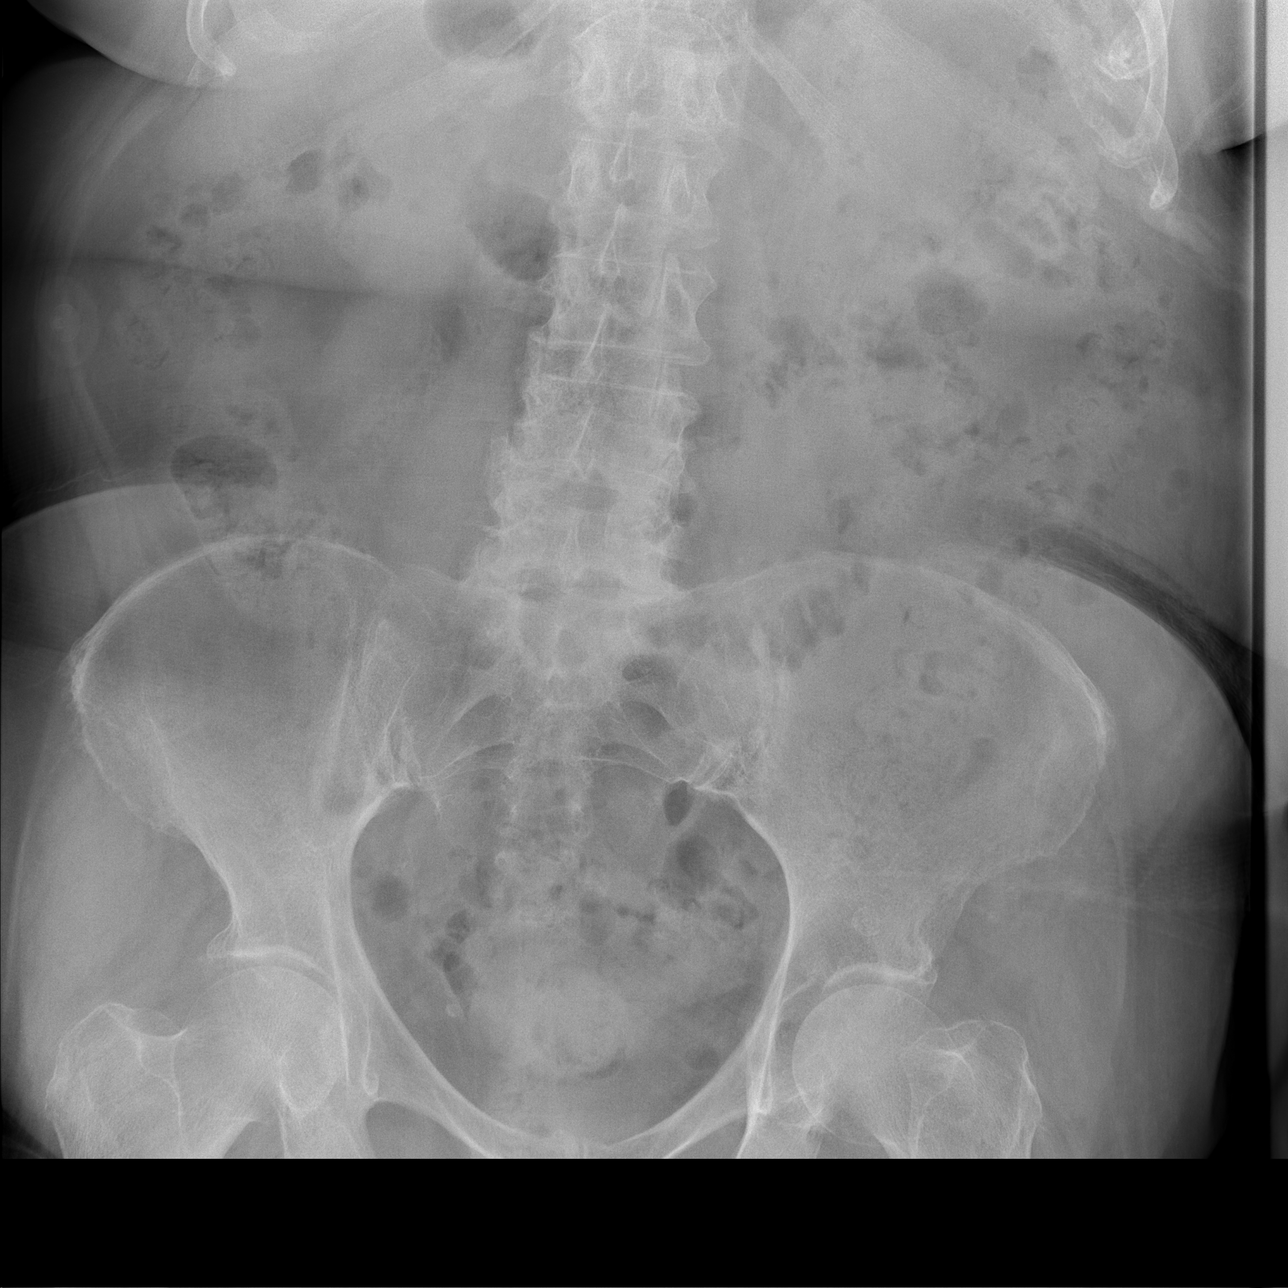

[3 of 3 positions shown; findings below may reference images not displayed]

FINDINGS: Lungs are essentially clear. No pleural effusion or
pneumothorax.

Cardiomediastinal silhouette is within normal limits.

Nonobstructive bowel gas pattern.  Moderate stool throughout the
colon.

No evidence of free air under the diaphragm on the upright view.

Cholecystectomy clips.

Mild degenerative changes of the visualized thoracolumbar spine.
IMPRESSION: No evidence of acute cardiopulmonary disease.

No evidence of small bowel obstruction or free air.

Moderate stool throughout the colon.

## 2013-02-16 ENCOUNTER — Encounter: Payer: Self-pay | Admitting: Cardiovascular Disease

## 2013-02-16 ENCOUNTER — Ambulatory Visit (INDEPENDENT_AMBULATORY_CARE_PROVIDER_SITE_OTHER): Payer: Medicare Other | Admitting: Cardiovascular Disease

## 2013-02-16 VITALS — BP 130/72 | HR 70 | Ht 63.0 in | Wt 218.1 lb

## 2013-02-16 DIAGNOSIS — I10 Essential (primary) hypertension: Secondary | ICD-10-CM

## 2013-02-16 DIAGNOSIS — E669 Obesity, unspecified: Secondary | ICD-10-CM

## 2013-02-16 DIAGNOSIS — E785 Hyperlipidemia, unspecified: Secondary | ICD-10-CM | POA: Diagnosis not present

## 2013-02-16 DIAGNOSIS — I2510 Atherosclerotic heart disease of native coronary artery without angina pectoris: Secondary | ICD-10-CM

## 2013-02-16 DIAGNOSIS — G4733 Obstructive sleep apnea (adult) (pediatric): Secondary | ICD-10-CM

## 2013-02-16 NOTE — Patient Instructions (Addendum)
Your physician has requested that you have a lexiscan myoview. For further information please visit [email or web address] Please follow instruction sheet, as given. Please schedule for January 2015.   Your physician recommends that you schedule a follow-up appointment in: February 2015.

## 2013-02-20 DIAGNOSIS — IMO0002 Reserved for concepts with insufficient information to code with codable children: Secondary | ICD-10-CM | POA: Diagnosis not present

## 2013-02-20 DIAGNOSIS — M1710 Unilateral primary osteoarthritis, unspecified knee: Secondary | ICD-10-CM | POA: Diagnosis not present

## 2013-02-27 DIAGNOSIS — IMO0002 Reserved for concepts with insufficient information to code with codable children: Secondary | ICD-10-CM | POA: Diagnosis not present

## 2013-02-27 DIAGNOSIS — M1710 Unilateral primary osteoarthritis, unspecified knee: Secondary | ICD-10-CM | POA: Diagnosis not present

## 2013-03-04 DIAGNOSIS — J019 Acute sinusitis, unspecified: Secondary | ICD-10-CM | POA: Diagnosis not present

## 2013-03-04 DIAGNOSIS — Z6836 Body mass index (BMI) 36.0-36.9, adult: Secondary | ICD-10-CM | POA: Diagnosis not present

## 2013-03-05 ENCOUNTER — Encounter: Payer: Self-pay | Admitting: Cardiovascular Disease

## 2013-03-05 DIAGNOSIS — G4733 Obstructive sleep apnea (adult) (pediatric): Secondary | ICD-10-CM | POA: Insufficient documentation

## 2013-03-05 DIAGNOSIS — E669 Obesity, unspecified: Secondary | ICD-10-CM | POA: Insufficient documentation

## 2013-03-05 DIAGNOSIS — E785 Hyperlipidemia, unspecified: Secondary | ICD-10-CM | POA: Insufficient documentation

## 2013-03-05 DIAGNOSIS — I10 Essential (primary) hypertension: Secondary | ICD-10-CM | POA: Insufficient documentation

## 2013-03-05 NOTE — Progress Notes (Signed)
Patient ID: [name], female   DOB: 09/16/1933, [age] y.o.   MRN: [id number]     HPI: [name], is a 79 y.o. female presents for a six-month cardiology evaluation. She has known coronary artery disease in 2007 suffered an acute coronary syndrome and underwent intervention to a totally occluded ramus intermediate vessel. She did have concomitant CAD with 60% distal circumflex stenosis and mid LAD intramyocardial muscle bridge in. Remotely she also had undergone stenting to right coronary artery. Additional problems include hypertension, obesity, hyperlipidemia, and untreated sleep apnea for where she has refused CPAP therapy. Also has undergone several your injections in her knees.  This [name] his last stress test was in December 2012 which showed breast attenuation and mild to moderate defect in the basal anterolateral collateral region without significant ischemia.  The past several months, she states she has felt fairly well. She denies recent chest tightness. She is unaware of palpitations. She denies significant myalgias  Past Medical History  Diagnosis Date  . Coronary artery disease 11/2009    2D Echo EF>55  . Hypertension   . High cholesterol   . Reflux esophagitis   . Obesity   . Arthritis   . Sleep apnea 09/27/2010    Untreated, REM 64.7/hr AHI 18.5/hr RDI 19.2/hr. Patuent refused CPAP therapy    Past Surgical History  Procedure Laterality Date  . Cholecystectomy    . Coronary angioplasty with stent placement  2007    A [id number] CYPHER stent post dilated to 3.29 mm the 100% occlusion was reduced to 0%    Allergies  Allergen Reactions  . Codeine   . Contrast Media (Iodinated Diagnostic Agents)   . Other     There are 3 other allergies but pt was unable to verify what they were    Current Outpatient Prescriptions  Medication Sig Dispense Refill  . amLODipine (NORVASC) 5 MG tablet Take 5 mg by mouth daily. Take 7.5 mg daily      . aspirin 81 MG tablet Take 81 mg  by mouth daily.      . carvedilol (COREG) 12.5 MG tablet Take 25 mg by mouth. Take 25 mg at night, 12.5 mg in the morning      . cholecalciferol (VITAMIN D) 1000 UNITS tablet Take 2,000 Units by mouth daily.      . clopidogrel (PLAVIX) 75 MG tablet Take 75 mg by mouth daily.      Marland Kitchen. esomeprazole (NEXIUM) 40 MG capsule Take 40 mg by mouth daily before breakfast.      . fluticasone (FLONASE) 50 MCG/ACT nasal spray Place 1 spray into the nose as needed.      . nitroGLYCERIN (NITROSTAT) 0.4 MG SL tablet Place 0.4 mg under the tongue every 5 (five) minutes as needed.      . rosuvastatin (CRESTOR) 20 MG tablet Take 20 mg by mouth daily.      . diazepam (VALIUM) 5 MG tablet Take 5 mg by mouth at bedtime as needed for anxiety.       No current facility-administered medications for this visit.    Socially she is married has 9 children 7 grandchildren 2 great-grandchildren. She does not routinely exercise. No alcohol use.  ROS is negative for fevers, chills or night sweats.   Other system review is negative.  PE BP 130/72  Pulse 70  Ht 5\' 3"  (1.6 m)  Wt 218 lb 1.6 oz (98.93 kg)  BMI 38.64 kg/m2  General: Alert, oriented, no distress.  Skin: normal turgor, no rashes HEENT: Normocephalic, atraumatic. Pupils round and reactive; sclera anicteric;no lid lag.  Nose without nasal septal hypertrophy Mouth/Parynx benign; Mallinpatti scale** Neck: No JVD, no carotid briuts Lungs: clear to ausculatation and percussion; no wheezing or rales Heart: RRR, s1 s2 normal  Abdomen: soft, nontender; no hepatosplenomehaly, BS+; abdominal aorta nontender and not dilated by palpation. Pulses 2+ Extremities: no clubbing cyanosis or edema, Homan's sign negative  Neurologic: grossly nonfocal  ECG: Normal sinus rhythm at 74 beats per minute with) stalk and associated repolarization changes.  LABS:  BMET    Component Value Date/Time   NA 137 03/09/2012 2350   K 3.6 03/09/2012 2350   CL 102 03/09/2012 2350   CO2  26 03/09/2012 2350   GLUCOSE 155* 03/09/2012 2350   BUN 11 03/09/2012 2350   CREATININE 0.77 03/09/2012 2350   CALCIUM 9.4 03/09/2012 2350   GFRNONAA 80* 03/09/2012 2350   GFRAA >90 03/09/2012 2350     Hepatic Function Panel     Component Value Date/Time   PROT 6.8 12/01/2011 1035   ALBUMIN 3.5 12/01/2011 1035   AST 17 12/01/2011 1035   ALT 12 12/01/2011 1035   ALKPHOS 69 12/01/2011 1035   BILITOT 0.4 12/01/2011 1035     CBC    Component Value Date/Time   WBC 6.2 03/09/2012 2350   RBC 4.25 03/09/2012 2350   HGB 12.1 03/09/2012 2350   HCT 35.8* 03/09/2012 2350   PLT 205 03/09/2012 2350   MCV 84.2 03/09/2012 2350   MCH 28.5 03/09/2012 2350   MCHC 33.8 03/09/2012 2350   RDW 14.5 03/09/2012 2350   LYMPHSABS 2.2 03/09/2012 2350   MONOABS 0.5 03/09/2012 2350   EOSABS 0.1 03/09/2012 2350   BASOSABS 0.0 03/09/2012 2350     BNP    Component Value Date/Time   PROBNP 67.9 12/01/2011 1035    Lipid Panel  No results found for this basename: chol, trig, hdl, cholhdl, vldl, ldlcalc     RADIOLOGY: No results found.    ASSESSMENT AND PLAN:  Ms. Bigness is now 79 years old and is 7 years following her acute coronary syndrome and intervention to a ramus intermediate vessel which was totally occluded. She did have concomitant CAD involving her circumflex and had an LAD and myocardial bridging as he had undergone stenting to her right coronary artery per she continues to not treat her sleep apnea. She does have hyperlipidemia and hypertension and obesity. She's not very active laboratory will be reassessed. In January or February 2015, I am recommending she undergo a followup Lexiscan nuclear perfusion study further assess scar/ischemia. I will see her back in the office in followup and further recommendations were made at that time.    Lennette Bihari, MD, [facility]  03/05/2013 2:58 PM

## 2013-03-06 DIAGNOSIS — M1710 Unilateral primary osteoarthritis, unspecified knee: Secondary | ICD-10-CM | POA: Diagnosis not present

## 2013-03-06 DIAGNOSIS — IMO0002 Reserved for concepts with insufficient information to code with codable children: Secondary | ICD-10-CM | POA: Diagnosis not present

## 2013-03-12 DIAGNOSIS — IMO0002 Reserved for concepts with insufficient information to code with codable children: Secondary | ICD-10-CM | POA: Diagnosis not present

## 2013-03-12 DIAGNOSIS — M1710 Unilateral primary osteoarthritis, unspecified knee: Secondary | ICD-10-CM | POA: Diagnosis not present

## 2013-03-18 DIAGNOSIS — IMO0002 Reserved for concepts with insufficient information to code with codable children: Secondary | ICD-10-CM | POA: Diagnosis not present

## 2013-03-18 DIAGNOSIS — M1710 Unilateral primary osteoarthritis, unspecified knee: Secondary | ICD-10-CM | POA: Diagnosis not present

## 2013-04-20 ENCOUNTER — Other Ambulatory Visit: Payer: Self-pay | Admitting: *Deleted

## 2013-04-20 MED ORDER — ROSUVASTATIN CALCIUM 20 MG PO TABS: 20.0000 mg | ORAL_TABLET | Freq: Every day | ORAL | Status: DC

## 2013-05-12 ENCOUNTER — Other Ambulatory Visit: Payer: Self-pay | Admitting: *Deleted

## 2013-05-12 MED ORDER — ESOMEPRAZOLE MAGNESIUM 40 MG PO CPDR: 40.0000 mg | DELAYED_RELEASE_CAPSULE | Freq: Every day | ORAL | Status: DC

## 2013-05-12 NOTE — Telephone Encounter (Signed)
Rx was sent to pharmacy electronically. 

## 2013-05-14 DIAGNOSIS — Z6835 Body mass index (BMI) 35.0-35.9, adult: Secondary | ICD-10-CM | POA: Diagnosis not present

## 2013-05-14 DIAGNOSIS — N390 Urinary tract infection, site not specified: Secondary | ICD-10-CM | POA: Diagnosis not present

## 2013-06-24 DIAGNOSIS — IMO0002 Reserved for concepts with insufficient information to code with codable children: Secondary | ICD-10-CM | POA: Diagnosis not present

## 2013-06-24 DIAGNOSIS — M1710 Unilateral primary osteoarthritis, unspecified knee: Secondary | ICD-10-CM | POA: Diagnosis not present

## 2013-07-08 ENCOUNTER — Telehealth: Payer: Self-pay | Admitting: *Deleted

## 2013-07-17 ENCOUNTER — Ambulatory Visit: Payer: Medicare Other | Admitting: Cardiovascular Disease

## 2013-07-20 ENCOUNTER — Other Ambulatory Visit: Payer: Self-pay | Admitting: Cardiovascular Disease

## 2013-07-20 NOTE — Telephone Encounter (Signed)
Rx was sent to pharmacy electronically. 

## 2013-07-21 ENCOUNTER — Ambulatory Visit: Payer: Medicare Other | Admitting: Cardiovascular Disease

## 2013-07-23 ENCOUNTER — Ambulatory Visit (INDEPENDENT_AMBULATORY_CARE_PROVIDER_SITE_OTHER): Payer: Medicare Other | Admitting: Cardiovascular Disease

## 2013-07-23 ENCOUNTER — Encounter: Payer: Self-pay | Admitting: Cardiovascular Disease

## 2013-07-23 VITALS — BP 138/80 | HR 70 | Ht 65.0 in | Wt 212.3 lb

## 2013-07-23 DIAGNOSIS — R5381 Other malaise: Secondary | ICD-10-CM | POA: Diagnosis not present

## 2013-07-23 DIAGNOSIS — I2510 Atherosclerotic heart disease of native coronary artery without angina pectoris: Secondary | ICD-10-CM | POA: Diagnosis not present

## 2013-07-23 DIAGNOSIS — I10 Essential (primary) hypertension: Secondary | ICD-10-CM

## 2013-07-23 DIAGNOSIS — E782 Mixed hyperlipidemia: Secondary | ICD-10-CM | POA: Diagnosis not present

## 2013-07-23 DIAGNOSIS — G4733 Obstructive sleep apnea (adult) (pediatric): Secondary | ICD-10-CM

## 2013-07-23 DIAGNOSIS — E669 Obesity, unspecified: Secondary | ICD-10-CM

## 2013-07-23 DIAGNOSIS — E785 Hyperlipidemia, unspecified: Secondary | ICD-10-CM

## 2013-07-23 NOTE — Progress Notes (Signed)
Patient ID: [name], female   DOB: 10/24/1933, 79 y.o.   MRN: [id number]     HPI: [name], is a 79 y.o. female presents for a follow up cardiology evaluation with history of CAD, hypertension, obesity, hyperlipidemia, and untreated sleep apnea for which she has refused CPAP therapy.  History: She has known coronary artery disease and in 2007 suffered an acute coronary syndrome and underwent intervention to a totally occluded ramus intermediate vessel. She did have concomitant CAD with 60% distal circumflex stenosis and mid LAD intramyocardial muscle bridging. Remotely she also had undergone stenting to right coronary artery. Last stress test was in December 2012 which showed breast attenuation and mild to moderate defect in the basal anterolateral collateral region without significant ischemia.  Interval History Patient has been doing well over the last 4 months without any chest tightness or palpitations. She does have shortness of breath with walking long distances or going up the stairs. Plan previously was for repeat stress test in January or February of 2015 as she requested this be done after the Holidays.    Past Medical History  Diagnosis Date  . Coronary artery disease 11/2009    2D Echo EF>55  . Hypertension   . High cholesterol   . Reflux esophagitis   . Obesity   . Arthritis   . Sleep apnea 09/27/2010    Untreated, REM 64.7/hr AHI 18.5/hr RDI 19.2/hr. Patuent refused CPAP therapy    Past Surgical History  Procedure Laterality Date  . Cholecystectomy    . Coronary angioplasty with stent placement  2007    A 3.0x4218mm CYPHER stent post dilated to 3.29 mm the 100% occlusion was reduced to 0%    Allergies  Allergen Reactions  . Codeine   . Contrast Media [Iodinated Diagnostic Agents]   . Other     There are 3 other allergies but pt was unable to verify what they were    Current Outpatient Prescriptions  Medication Sig Dispense Refill  . amLODipine  (NORVASC) 5 MG tablet Take 1.5 tablets (7.5 mg total) by mouth daily.  45 tablet  6  . aspirin 81 MG tablet Take 81 mg by mouth daily.      . carvedilol (COREG) 12.5 MG tablet Take 25 mg by mouth. Take 25 mg at night, 12.5 mg in the morning      . cholecalciferol (VITAMIN D) 1000 UNITS tablet Take 2,000 Units by mouth daily.      . clopidogrel (PLAVIX) 75 MG tablet Take 75 mg by mouth daily.      . diazepam (VALIUM) 5 MG tablet Take 5 mg by mouth at bedtime as needed for anxiety.      Marland Kitchen. esomeprazole (NEXIUM) 40 MG capsule Take 1 capsule (40 mg total) by mouth daily before breakfast.  30 capsule  11  . fluticasone (FLONASE) 50 MCG/ACT nasal spray Place 1 spray into the nose as needed.      . nitroGLYCERIN (NITROSTAT) 0.4 MG SL tablet Place 0.4 mg under the tongue every 5 (five) minutes as needed.      . rosuvastatin (CRESTOR) 20 MG tablet Take 1 tablet (20 mg total) by mouth daily.  30 tablet  6   No current facility-administered medications for this visit.    Socially she is married has 9 children 7 grandchildren 2 great-grandchildren. She does not routinely exercise due to "bone on bone" knee OA for which she does not want knee replacement. No alcohol use.  ROS  is negative for fevers, chills or night sweats. She denies skin changes. She has lost proximally 6 pounds since June. She does admit to fatigue. There is some residual daytime sleepiness. She denies tachycardia palpitations. She denies chest pain. She denies any significant shortness of breath or wheezing. There is no abdominal pain. She denies nausea or vomiting. She denies constipation. There is no claudication. She does have problems with her knees and is difficult to walking. She does not have overt diabetes. Other comprehensive 12 system review is negative.  PE BP 138/80  Pulse 70  Ht 5\' 5"  (1.651 m)  Wt 212 lb 4.8 oz (96.299 kg)  BMI 35.33 kg/m2  General: Alert, oriented, no distress.  Skin: normal turgor, no rashes HEENT:  Normocephalic, atraumatic.  Mouth/Parynx benign; Mallinpatti scale 3.  Neck: No JVD, no carotid bruits Lungs: clear to ausculatation and percussion; no wheezing or rales Heart: RRR, s1 s2 normal  Abdomen: soft, nontender; no hepatosplenomehaly, BS+ Ext: Pulses 2+ Extremities: no edema Neurologic: grossly nonfocal  ECG: Normal sinus rhythm at 70 beats per minute with right axis deviation, incomplete right bundle branch block; normal intervals  LABS:  BMET    Component Value Date/Time   NA 137 03/09/2012 2350   K 3.6 03/09/2012 2350   CL 102 03/09/2012 2350   CO2 26 03/09/2012 2350   GLUCOSE 155* 03/09/2012 2350   BUN 11 03/09/2012 2350   CREATININE 0.77 03/09/2012 2350   CALCIUM 9.4 03/09/2012 2350   GFRNONAA 80* 03/09/2012 2350   GFRAA >90 03/09/2012 2350     Hepatic Function Panel     Component Value Date/Time   PROT 6.8 12/01/2011 1035   ALBUMIN 3.5 12/01/2011 1035   AST 17 12/01/2011 1035   ALT 12 12/01/2011 1035   ALKPHOS 69 12/01/2011 1035   BILITOT 0.4 12/01/2011 1035     CBC    Component Value Date/Time   WBC 6.2 03/09/2012 2350   RBC 4.25 03/09/2012 2350   HGB 12.1 03/09/2012 2350   HCT 35.8* 03/09/2012 2350   PLT 205 03/09/2012 2350   MCV 84.2 03/09/2012 2350   MCH 28.5 03/09/2012 2350   MCHC 33.8 03/09/2012 2350   RDW 14.5 03/09/2012 2350   LYMPHSABS 2.2 03/09/2012 2350   MONOABS 0.5 03/09/2012 2350   EOSABS 0.1 03/09/2012 2350   BASOSABS 0.0 03/09/2012 2350     BNP    Component Value Date/Time   PROBNP 67.9 12/01/2011 1035    Lipid Panel  No results found for this basename: chol,  trig,  hdl,  cholhdl,  vldl,  ldlcalc     RADIOLOGY: No results found.    ASSESSMENT AND PLAN:  [name] is a 62F now 7 years out from her acute coronary syndrome and intervention to a ramus intermediate vessel which was totally occluded. She did have concomitant CAD involving her circumflex and had an LAD and myocardial bridging as she had undergone stenting to her right  coronary artery remotely. Regarding her CAD, patient will return in 6 months in April or May and we will repeat a Lexiscan nuclear perfusion study at that time.   She continues to not treat her sleep apnea. We had an extensive discussion with the patient about the potential benefits of CPAP and risks of not using it. Patient opts to continue without CPAP and states she has had 3 different attempted titrations and did not tolerate the different masks including nasal pillow.   She does have hyperlipidemia and hypertension  and obesity. We will repeat a CBC, CMET, lipid profile at this time as patient believes she has not had these in over a year.  ____________________________________________________________________________________________________________  Dr. Tresa Endo has seen and evaluated the patient. We have discussed the history, exam, assessment, and plan as noted above. He agrees with management.   Aldine Contes. Marti Sleigh, MD, PGY-3 [facility] 07/23/2013 3:57 PM  Patient seen and examined. Agree with assessment and plan. Patient continues to do well as her coronary artery disease and is not having any anginal symptomatology. She continues to have a history of hypertension, obesity, hyperlipidemia as well as untreated sleep apnea. She refuses to use CPAP and I spent time with her today discussing potential negative effects from a cardiovascular system related to her desire not to be treated. We discussed options even including customize dental oral appliance. She does have sinus rhythm with right bundle branch block on her ECG which is unchanged. She's not had laboratory checked in over a year. Fasting  laboratory will be obtained over the next week and we will contact the patient if adjustments need to be made with reference to her medical therapy.    Lennette Bihari, MD, [facility] 07/23/2013 7:02 PM

## 2013-07-23 NOTE — Patient Instructions (Signed)
Your physician recommends that you schedule a follow-up appointment in: 6 MONTHS  Your physician has requested that you have a lexiscan myoview. For further information please visit [email or web address] Please follow instruction sheet, as given. This will be done in 6 months.

## 2013-07-29 DIAGNOSIS — R5381 Other malaise: Secondary | ICD-10-CM | POA: Diagnosis not present

## 2013-07-29 DIAGNOSIS — I2510 Atherosclerotic heart disease of native coronary artery without angina pectoris: Secondary | ICD-10-CM | POA: Diagnosis not present

## 2013-07-29 DIAGNOSIS — E782 Mixed hyperlipidemia: Secondary | ICD-10-CM | POA: Diagnosis not present

## 2013-07-29 LAB — COMPREHENSIVE METABOLIC PANEL
ALT: 9 U/L (ref 0–35)
AST: 13 U/L (ref 0–37)
Albumin: 3.8 g/dL (ref 3.5–5.2)
Alkaline Phosphatase: 73 U/L (ref 39–117)
BUN: 15 mg/dL (ref 6–23)
CO2: 28 mEq/L (ref 19–32)
Calcium: 9.4 mg/dL (ref 8.4–10.5)
Chloride: 106 mEq/L (ref 96–112)
Creat: 0.99 mg/dL (ref 0.50–1.10)
Glucose, Bld: 101 mg/dL — ABNORMAL HIGH (ref 70–99)
Potassium: 4.3 mEq/L (ref 3.5–5.3)
Sodium: 141 mEq/L (ref 135–145)
Total Bilirubin: 0.3 mg/dL (ref 0.3–1.2)
Total Protein: 6.9 g/dL (ref 6.0–8.3)

## 2013-07-29 LAB — LIPID PANEL
Cholesterol: 141 mg/dL (ref 0–200)
HDL: 45 mg/dL (ref >39)
LDL Cholesterol: 75 mg/dL (ref 0–99)
Total CHOL/HDL Ratio: 3.1 Ratio
Triglycerides: 105 mg/dL (ref <150)
VLDL: 21 mg/dL (ref 0–40)

## 2013-07-29 LAB — CBC
HCT: 37.5 % (ref 36.0–46.0)
Hemoglobin: 12 g/dL (ref 12.0–15.0)
MCH: 24.8 pg — ABNORMAL LOW (ref 26.0–34.0)
MCHC: 32 g/dL (ref 30.0–36.0)
MCV: 77.6 fL — ABNORMAL LOW (ref 78.0–100.0)
Platelets: 267 10*3/uL (ref 150–400)
RBC: 4.83 MIL/uL (ref 3.87–5.11)
RDW: 16.9 % — ABNORMAL HIGH (ref 11.5–15.5)
WBC: 5.6 10*3/uL (ref 4.0–10.5)

## 2013-07-29 LAB — TSH: TSH: 1.483 u[IU]/mL (ref 0.350–4.500)

## 2013-08-31 ENCOUNTER — Telehealth: Payer: Self-pay | Admitting: *Deleted

## 2013-08-31 NOTE — Telephone Encounter (Signed)
Message copied by Gaynelle Cage on Mon Aug 31, 2013  3:31 PM ------      Message from: Nicki Guadalajara A      Created: Sat Aug 29, 2013 11:55 AM       Labs good x MCV low; check fe studies, tibc, ferritin ------

## 2013-08-31 NOTE — Telephone Encounter (Signed)
Left message to return a call to discuss lab results. 

## 2013-09-01 ENCOUNTER — Other Ambulatory Visit: Payer: Self-pay | Admitting: *Deleted

## 2013-09-01 ENCOUNTER — Telehealth: Payer: Self-pay | Admitting: Cardiovascular Disease

## 2013-09-01 DIAGNOSIS — R7989 Other specified abnormal findings of blood chemistry: Secondary | ICD-10-CM

## 2013-09-01 NOTE — Telephone Encounter (Signed)
Message forwarded to W. Waddell, CMA.  

## 2013-09-01 NOTE — Telephone Encounter (Signed)
Returning [place] call from yesterday  Please call

## 2013-09-07 DIAGNOSIS — R7989 Other specified abnormal findings of blood chemistry: Secondary | ICD-10-CM | POA: Diagnosis not present

## 2013-09-07 LAB — IRON AND TIBC
%SAT: 12 % — ABNORMAL LOW (ref 20–55)
Iron: 49 ug/dL (ref 42–145)
TIBC: 403 ug/dL (ref 250–470)
UIBC: 354 ug/dL (ref 125–400)

## 2013-09-07 LAB — IRON: Iron: 47 ug/dL (ref 42–145)

## 2013-09-07 LAB — FERRITIN: Ferritin: 10 ng/mL (ref 10–291)

## 2013-09-14 ENCOUNTER — Other Ambulatory Visit: Payer: Self-pay | Admitting: *Deleted

## 2013-09-14 DIAGNOSIS — D649 Anemia, unspecified: Secondary | ICD-10-CM

## 2013-09-14 MED ORDER — POLYSACCHARIDE IRON COMPLEX 150 MG PO CAPS: 150.0000 mg | ORAL_CAPSULE | Freq: Two times a day (BID) | ORAL | Status: DC

## 2013-09-16 ENCOUNTER — Telehealth: Payer: Self-pay | Admitting: Cardiovascular Disease

## 2013-09-16 DIAGNOSIS — D649 Anemia, unspecified: Secondary | ICD-10-CM

## 2013-09-16 NOTE — Telephone Encounter (Signed)
SPOKE TO PATIENT. INFORMED PATIENT THAT ORDER WILL BE RELEASE . APOLOGIZED TO PATIENT FOR THE EARLY ISSUE AT THE LAB.  THE REQUEST SHOULD BE VISIBLE TO LAB TOMORROW. PATIENT VERBALIZED UNDERSTANDING.

## 2013-09-16 NOTE — Telephone Encounter (Signed)
[facility] is at [facility] [place].They say they need an order for the stool culture. Pt is there and they wont take it until the order gets there.

## 2013-09-17 ENCOUNTER — Other Ambulatory Visit: Payer: Self-pay | Admitting: Cardiovascular Disease

## 2013-09-17 ENCOUNTER — Telehealth: Payer: Self-pay | Admitting: Cardiovascular Disease

## 2013-09-17 DIAGNOSIS — D649 Anemia, unspecified: Secondary | ICD-10-CM | POA: Diagnosis not present

## 2013-09-17 NOTE — Telephone Encounter (Signed)
Returned call to [place] at [facility].  Stated she called pt's daughter to have her come back in to get three vials b/c they only received one.  Stated she was not cooperative and that's why she called the office.  Asked if RN would call pt/daughter.  Call to pt and verified x 2.  Pt informed of situation.  Pt upset.  Stated she did send 3 vials back today and isn't sure what is going on.  Pt explained what happened yesterday (see triage note).  Pt informed the lab cannot be processed if she doesn't return and asked if she was willing to go back to get vials.  Pt agreed and stated she will go tomorrow b/c it's too cold today.  Pt informed that is understandable and lab will be informed.  Pt verbalized understanding and agreed w/ plan.  Call to lab and spoke w/ Selena BattenKim.  Informed pt will come in tomorrow.  Agreed.  Stated lab ordered needs 3 clean vials and pt did not get clean vials yesterday.

## 2013-09-17 NOTE — Telephone Encounter (Signed)
Need to get 2 more vials of blood from patient.  She called patient and pt not cooperative.  Patient was in their lab this am  Please call

## 2013-09-18 ENCOUNTER — Telehealth: Payer: Self-pay | Admitting: Cardiovascular Disease

## 2013-09-18 LAB — FECAL OCCULT BLOOD, IMMUNOCHEMICAL: Fecal Occult Blood: NEGATIVE

## 2013-09-18 NOTE — Telephone Encounter (Signed)
Per answering service-[facility] need a lab order for a stool culture please.

## 2013-09-18 NOTE — Telephone Encounter (Signed)
Call was handled yesterday per [place].

## 2013-09-24 DIAGNOSIS — Z6834 Body mass index (BMI) 34.0-34.9, adult: Secondary | ICD-10-CM | POA: Diagnosis not present

## 2013-09-24 DIAGNOSIS — J111 Influenza due to unidentified influenza virus with other respiratory manifestations: Secondary | ICD-10-CM | POA: Diagnosis not present

## 2013-09-29 ENCOUNTER — Telehealth: Payer: Self-pay | Admitting: *Deleted

## 2013-09-29 NOTE — Telephone Encounter (Signed)
Informed patient of NEG stool samples.

## 2013-09-29 NOTE — Telephone Encounter (Signed)
Message copied by Gaynelle CageWADDELL, Kriston Pasquarello M. on Tue Sep 29, 2013 12:35 PM ------      Message from: Nicki GuadalajaraKELLY, THOMAS A      Created: Sun Sep 20, 2013 10:06 PM       neg ------

## 2013-10-16 DIAGNOSIS — M1710 Unilateral primary osteoarthritis, unspecified knee: Secondary | ICD-10-CM | POA: Diagnosis not present

## 2013-10-16 DIAGNOSIS — IMO0002 Reserved for concepts with insufficient information to code with codable children: Secondary | ICD-10-CM | POA: Diagnosis not present

## 2013-11-16 ENCOUNTER — Other Ambulatory Visit: Payer: Self-pay | Admitting: *Deleted

## 2013-11-16 MED ORDER — CLOPIDOGREL BISULFATE 75 MG PO TABS: 75.0000 mg | ORAL_TABLET | Freq: Every day | ORAL | Status: DC

## 2013-11-16 NOTE — Telephone Encounter (Signed)
Rx was sent to pharmacy electronically. 

## 2013-12-31 NOTE — Telephone Encounter (Signed)
Encounter Closed--TP 12/31/2013 

## 2014-01-12 DIAGNOSIS — J069 Acute upper respiratory infection, unspecified: Secondary | ICD-10-CM | POA: Diagnosis not present

## 2014-01-12 DIAGNOSIS — J019 Acute sinusitis, unspecified: Secondary | ICD-10-CM | POA: Diagnosis not present

## 2014-01-12 DIAGNOSIS — Z6835 Body mass index (BMI) 35.0-35.9, adult: Secondary | ICD-10-CM | POA: Diagnosis not present

## 2014-01-15 ENCOUNTER — Telehealth (HOSPITAL_COMMUNITY): Payer: Self-pay

## 2014-01-19 ENCOUNTER — Telehealth (HOSPITAL_COMMUNITY): Payer: Self-pay

## 2014-01-20 ENCOUNTER — Ambulatory Visit (HOSPITAL_COMMUNITY)
Admission: RE | Admit: 2014-01-20 | Discharge: 2014-01-20 | Disposition: A | Discharge status: Home / Self Care | Payer: Medicare Other | Source: Ambulatory Visit | Attending: Cardiovascular Disease | Admitting: Cardiovascular Disease

## 2014-01-20 DIAGNOSIS — I2510 Atherosclerotic heart disease of native coronary artery without angina pectoris: Secondary | ICD-10-CM | POA: Diagnosis not present

## 2014-01-20 MED ORDER — REGADENOSON 0.4 MG/5ML IV SOLN
0.4000 mg | Freq: Once | INTRAVENOUS | Status: AC
  Administered 2014-01-20: 0.4 mg via INTRAVENOUS

## 2014-01-20 MED ORDER — TECHNETIUM TC 99M SESTAMIBI GENERIC - CARDIOLITE
30.0000 | Freq: Once | INTRAVENOUS | Status: AC | PRN
  Administered 2014-01-20: 30 via INTRAVENOUS

## 2014-01-20 MED ORDER — TECHNETIUM TC 99M SESTAMIBI GENERIC - CARDIOLITE
10.0000 | Freq: Once | INTRAVENOUS | Status: AC | PRN
  Administered 2014-01-20: 10 via INTRAVENOUS

## 2014-01-20 MED ORDER — AMINOPHYLLINE 25 MG/ML IV SOLN
75.0000 mg | Freq: Once | INTRAVENOUS | Status: AC
  Administered 2014-01-20: 75 mg via INTRAVENOUS

## 2014-01-20 NOTE — Procedures (Addendum)
[facility] [address] [place] [address] [phone]  Cardiology Nuclear Med Study  Sarajean BRITTONY KOELKER is a 80 y.o. female     MRN : [id number]     DOB: [birth year]  Procedure Date: 01/20/2014  Nuclear Med Background Indication for Stress Test:  Stent Patency History:  CAD;MI-2007;STENT/PTCA-2007;ACS-2007;Last NUC MPI on 08/21/2011-nonischemic/scar;EF=62%;ECHO on 11/2009-EF=55% Cardiac Risk Factors: Family History - CAD, Hypertension, Lipids, Obesity and RBBB  Symptoms:  Dizziness, DOE, Fatigue, Light-Headedness, Palpitations and weakness   Nuclear Pre-Procedure Caffeine/Decaff Intake:  9:00pm NPO After: 7:00am   IV Site: R Forearm  IV 0.9% NS with Angio Cath:  22g  Chest Size (in):  n/a IV Started by: Berdie Ogren, RN  Height: 5\' 5"  (1.651 m)  Cup Size: D  BMI:  Body mass index is 34.61 kg/(m^2). Weight:  208 lb (94.348 kg)   Tech Comments:  n/a    Nuclear Med Study 1 or 2 day study: 1 day  Stress Test Type:  Lexiscan  Order Authorizing Provider:  Nanetta Batty, MD   Resting Radionuclide: Technetium 60m Sestamibi  Resting Radionuclide Dose: 10.2 mCi   Stress Radionuclide:  Technetium 89m Sestamibi  Stress Radionuclide Dose: 30.9 mCi           Stress Protocol Rest HR: 59 Stress HR: 74  Rest BP: 166/98 Stress BP: 150/82  Exercise Time (min): n/a METS: n/a   Predicted Max HR: 143 bpm % Max HR: 66.43 bpm Rate Pressure Product: 11031  Dose of Adenosine (mg):  n/a Dose of Lexiscan: 0.4 mg  Dose of Atropine (mg): n/a Dose of Dobutamine: n/a mcg/kg/min (at max HR)  Stress Test Technologist: Esperanza Sheets, CCT Nuclear Technologist: Koren Shiver, CNMT   Rest Procedure:  Myocardial perfusion imaging was performed at rest 45 minutes following the intravenous administration of Technetium 34m Sestamibi. Stress Procedure:  The patient received IV Lexiscan 0.4 mg over 15-seconds.  Technetium 8m Sestamibi injected IV  at 30-seconds. Patient experienced SOB and stomach pain and 75 mg of Aminophylline IV was administered at 5 minutes. There were no significant changes with Lexiscan.  Quantitative spect images were obtained after a 45 minute delay.  Transient Ischemic Dilatation (Normal <1.22):  1.00 Lung/Heart Ratio (Normal <0.45):  0.20 QGS EDV:  68 ml QGS ESV:  26 ml LV Ejection Fraction: 62%      Rest ECG: NSR-RBBB  Stress ECG: No significant ST segment change suggestive of ischemia.  QPS Raw Data Images:  Normal; no motion artifact; normal heart/lung ratio. Stress Images:  There is decreased uptake in basal and mid segments of the anterolateral wall. Rest Images:  Comparison with the stress images reveals no significant change. Subtraction (SDS):  There is a fixed defect that is most consistent with a previous infarction. LV Wall Motion:  NL LV Function; NL Wall Motion  Impression Exercise Capacity:  Lexiscan with no exercise. BP Response:  Normal blood pressure response. Clinical Symptoms:  No significant symptoms noted. ECG Impression:  No significant ECG changes with Lexiscan. Comparison with Prior Nuclear Study: No significant change from previous study   Overall Impression:  Low risk stress nuclear study with a mild to moderate basal-mid anterolateral scar. There is no reversible ischemia.Thurmon Fair, MD  01/20/2014 12:52 PM

## 2014-01-22 ENCOUNTER — Encounter: Payer: Self-pay | Admitting: *Deleted

## 2014-02-09 ENCOUNTER — Other Ambulatory Visit: Payer: Self-pay

## 2014-02-09 MED ORDER — AMLODIPINE BESYLATE 5 MG PO TABS: 7.5000 mg | ORAL_TABLET | Freq: Every day | ORAL | Status: DC

## 2014-02-09 NOTE — Telephone Encounter (Signed)
Rx was sent to pharmacy electronically. 

## 2014-02-10 ENCOUNTER — Ambulatory Visit (INDEPENDENT_AMBULATORY_CARE_PROVIDER_SITE_OTHER): Payer: Medicare Other | Admitting: Cardiovascular Disease

## 2014-02-10 ENCOUNTER — Encounter: Payer: Self-pay | Admitting: Cardiovascular Disease

## 2014-02-10 VITALS — BP 148/80 | HR 82 | Ht 65.0 in | Wt 209.2 lb

## 2014-02-10 DIAGNOSIS — D649 Anemia, unspecified: Secondary | ICD-10-CM | POA: Diagnosis not present

## 2014-02-10 DIAGNOSIS — G4733 Obstructive sleep apnea (adult) (pediatric): Secondary | ICD-10-CM | POA: Diagnosis not present

## 2014-02-10 DIAGNOSIS — R5381 Other malaise: Secondary | ICD-10-CM

## 2014-02-10 DIAGNOSIS — E782 Mixed hyperlipidemia: Secondary | ICD-10-CM

## 2014-02-10 DIAGNOSIS — E669 Obesity, unspecified: Secondary | ICD-10-CM

## 2014-02-10 DIAGNOSIS — I2510 Atherosclerotic heart disease of native coronary artery without angina pectoris: Secondary | ICD-10-CM | POA: Diagnosis not present

## 2014-02-10 DIAGNOSIS — R5383 Other fatigue: Secondary | ICD-10-CM

## 2014-02-10 DIAGNOSIS — E785 Hyperlipidemia, unspecified: Secondary | ICD-10-CM

## 2014-02-10 DIAGNOSIS — I10 Essential (primary) hypertension: Secondary | ICD-10-CM

## 2014-02-10 MED ORDER — AMLODIPINE BESYLATE 10 MG PO TABS: 10.0000 mg | ORAL_TABLET | Freq: Every day | ORAL | Status: DC

## 2014-02-10 NOTE — Patient Instructions (Signed)
Your physician has recommended you make the following change in your medication: increase the amlodipine to  10 mg daily. A new prescription has been sent to the pharmacy.  Your physician recommends that you return for lab work in: fasting.  Your physician recommends that you schedule a follow-up appointment in: 6 months.

## 2014-02-16 DIAGNOSIS — J019 Acute sinusitis, unspecified: Secondary | ICD-10-CM | POA: Diagnosis not present

## 2014-02-16 DIAGNOSIS — R7309 Other abnormal glucose: Secondary | ICD-10-CM | POA: Diagnosis not present

## 2014-02-16 DIAGNOSIS — Z6834 Body mass index (BMI) 34.0-34.9, adult: Secondary | ICD-10-CM | POA: Diagnosis not present

## 2014-03-02 ENCOUNTER — Other Ambulatory Visit: Payer: Self-pay | Admitting: Cardiovascular Disease

## 2014-03-02 DIAGNOSIS — R7309 Other abnormal glucose: Secondary | ICD-10-CM | POA: Diagnosis not present

## 2014-03-02 DIAGNOSIS — R5381 Other malaise: Secondary | ICD-10-CM | POA: Diagnosis not present

## 2014-03-02 DIAGNOSIS — D649 Anemia, unspecified: Secondary | ICD-10-CM | POA: Diagnosis not present

## 2014-03-02 DIAGNOSIS — Z6834 Body mass index (BMI) 34.0-34.9, adult: Secondary | ICD-10-CM | POA: Diagnosis not present

## 2014-03-02 DIAGNOSIS — E782 Mixed hyperlipidemia: Secondary | ICD-10-CM | POA: Diagnosis not present

## 2014-03-02 LAB — CBC
HCT: 37.9 % (ref 36.0–46.0)
Hemoglobin: 12.3 g/dL (ref 12.0–15.0)
MCH: 24.8 pg — ABNORMAL LOW (ref 26.0–34.0)
MCHC: 32.5 g/dL (ref 30.0–36.0)
MCV: 76.4 fL — ABNORMAL LOW (ref 78.0–100.0)
Platelets: 239 10*3/uL (ref 150–400)
RBC: 4.96 MIL/uL (ref 3.87–5.11)
RDW: 18.2 % — ABNORMAL HIGH (ref 11.5–15.5)
WBC: 6.3 10*3/uL (ref 4.0–10.5)

## 2014-03-02 LAB — LIPID PANEL
Cholesterol: 171 mg/dL (ref 0–200)
HDL: 56 mg/dL (ref >39)
LDL Cholesterol: 98 mg/dL (ref 0–99)
Total CHOL/HDL Ratio: 3.1 Ratio
Triglycerides: 87 mg/dL (ref <150)
VLDL: 17 mg/dL (ref 0–40)

## 2014-03-02 LAB — HEMOGLOBIN A1C
Hgb A1c MFr Bld: 6 % — ABNORMAL HIGH (ref <5.7)
Mean Plasma Glucose: 126 mg/dL — ABNORMAL HIGH (ref <117)

## 2014-03-02 LAB — COMPREHENSIVE METABOLIC PANEL
ALT: 11 U/L (ref 0–35)
AST: 14 U/L (ref 0–37)
Albumin: 3.7 g/dL (ref 3.5–5.2)
Alkaline Phosphatase: 72 U/L (ref 39–117)
BUN: 17 mg/dL (ref 6–23)
CO2: 29 mEq/L (ref 19–32)
Calcium: 9.2 mg/dL (ref 8.4–10.5)
Chloride: 104 mEq/L (ref 96–112)
Creat: 0.96 mg/dL (ref 0.50–1.10)
Glucose, Bld: 98 mg/dL (ref 70–99)
Potassium: 4.6 mEq/L (ref 3.5–5.3)
Sodium: 140 mEq/L (ref 135–145)
Total Bilirubin: 0.4 mg/dL (ref 0.2–1.2)
Total Protein: 6.8 g/dL (ref 6.0–8.3)

## 2014-03-02 LAB — IRON AND TIBC
%SAT: 12 % — ABNORMAL LOW (ref 20–55)
Iron: 48 ug/dL (ref 42–145)
TIBC: 385 ug/dL (ref 250–470)
UIBC: 337 ug/dL (ref 125–400)

## 2014-03-03 LAB — FERRITIN: Ferritin: 9 ng/mL — ABNORMAL LOW (ref 10–291)

## 2014-03-03 LAB — TSH: TSH: 1.339 u[IU]/mL (ref 0.350–4.500)

## 2014-03-05 ENCOUNTER — Encounter: Payer: Self-pay | Admitting: Cardiovascular Disease

## 2014-03-05 NOTE — Progress Notes (Signed)
Patient ID: [name], female   DOB: 02/09/1934, 80 y.o.   MRN: [id number]     HPI: [name] is a 80 y.o. female who presents to the office today for a 7 month follow up cardiology evaluation.  [name] has a history of known CAD, hypertension, obesity, hyperlipidemia, as well as untreated sleep apnea, for which he has refused CPAP therapy.  In 2007.  She suffered an acute coronary syndrome and underwent PCI to a totally occluded ramus intermediate vessel.  She had concomitant CAD with 60% distal circumflex stenosis and mid LAD myocardial muscle bridging.  Remotely, she had undergone stenting to RCA.  Her stress test in December 2012, showed mild to moderate defect in the basal anterolateral region without significant ischemia with breast attenuation.  I last saw her in November 2014.  Over the past year, she has continued to do fairly well.  She has lost weight from 242 pounds to 209 pounds.  She has been maintained on amlodipine 7.5 mg, carvedilol 12.5 mg in the morning and 12.5 mg at night for blood pressure control.  She has tolerated Crestor 20 mg for hyperlipidemia.  She is on aspirin and Plavix for dual antiplatelet therapy and denies bleeding episodes.  She has had some difficulty with sinus congestion and allergies.  She denies any exertional chest pain.  She denies palpitations.  She denies PND or orthopnea.  She underwent a followup nuclear perfusion study on 01/20/2014.  This remains low risk and showed mild to moderate basal, mid, anterolateral scar without reversible ischemia, unchanged from her prior study.  Past Medical History  Diagnosis Date  . Coronary artery disease 11/2009    2D Echo EF>55  . Hypertension   . High cholesterol   . Reflux esophagitis   . Obesity   . Arthritis   . Sleep apnea 09/27/2010    Untreated, REM 64.7/hr AHI 18.5/hr RDI 19.2/hr. Patuent refused CPAP therapy    Past Surgical History  Procedure Laterality Date  . Cholecystectomy    .  Coronary angioplasty with stent placement  2007    A 3.0x5418mm CYPHER stent post dilated to 3.29 mm the 100% occlusion was reduced to 0%    Allergies  Allergen Reactions  . Codeine   . Contrast Media [Iodinated Diagnostic Agents]   . Other     There are 3 other allergies but pt was unable to verify what they were    Current Outpatient Prescriptions  Medication Sig Dispense Refill  . aspirin 81 MG tablet Take 81 mg by mouth daily.      . carvedilol (COREG) 12.5 MG tablet Take 25 mg by mouth. Take 25 mg at night, 12.5 mg in the morning      . cholecalciferol (VITAMIN D) 1000 UNITS tablet Take 2,000 Units by mouth daily.      . clopidogrel (PLAVIX) 75 MG tablet Take 1 tablet (75 mg total) by mouth daily.  30 tablet  7  . diazepam (VALIUM) 5 MG tablet Take 5 mg by mouth at bedtime as needed for anxiety.      Marland Kitchen. esomeprazole (NEXIUM) 40 MG capsule Take 1 capsule (40 mg total) by mouth daily before breakfast.  30 capsule  11  . fluticasone (FLONASE) 50 MCG/ACT nasal spray Place 1 spray into the nose as needed.      . iron polysaccharides (NIFEREX) 150 MG capsule Take 150 mg by mouth. Will take every other day      . nitroGLYCERIN (  NITROSTAT) 0.4 MG SL tablet Place 0.4 mg under the tongue every 5 (five) minutes as needed.      . rosuvastatin (CRESTOR) 20 MG tablet Take 1 tablet (20 mg total) by mouth daily.  30 tablet  6  . amLODipine (NORVASC) 10 MG tablet Take 1 tablet (10 mg total) by mouth daily.  30 tablet  6   No current facility-administered medications for this visit.    History   Social History  . Marital Status: Single    Spouse Name: N/A    Number of Children: N/A  . Years of Education: N/A   Occupational History  . Not on file.   Social History Main Topics  . Smoking status: Never Smoker   . Smokeless tobacco: Never Used  . Alcohol Use: No  . Drug Use: No  . Sexual Activity: Not on file   Other Topics Concern  . Not on file   Social History Narrative  . No  narrative on file   Socially, she has 9 children, and 7 grandchildren, and 2 great-grandchildren.  There is no tobacco or alcohol use.  History reviewed. No pertinent family history.  ROS General: Negative; No fevers, chills, or night sweats HEENT: Negative; No changes in vision or hearing, sinus congestion, difficulty swallowing Pulmonary: Negative; No cough, wheezing, shortness of breath, hemoptysis Cardiovascular: See HPI: No chest pain, presyncope, syncope, palpatations GI: Negative; No nausea, vomiting, diarrhea, or abdominal pain GU: Negative; No dysuria, hematuria, or difficulty voiding Musculoskeletal: Negative; no myalgias, joint pain, or weakness Hematologic: History of anemia no easy bruising, bleeding Endocrine: Negative; no heat/cold intolerance; no diabetes, Neuro: Negative; no changes in balance, headaches Skin: Negative; No rashes or skin lesions Psychiatric: Negative; No behavioral problems, depression Sleep: Positive for sleep apnea, no hypersomnolence, bruxism, restless legs, hypnogognic hallucinations. Other comprehensive 14 point system review is negative   Physical Exam BP 148/80  Pulse 82  Ht 5\' 5"  (1.651 m)  Wt 209 lb 3.2 oz (94.892 kg)  BMI 34.81 kg/m2 General: Alert, oriented, no distress.  Skin: normal turgor, no rashes, warm and dry HEENT: Normocephalic, atraumatic. Pupils equal round and reactive to light; sclera anicteric; extraocular muscles intact, No lid lag; Nose without nasal septal hypertrophy; Mouth/Parynx benign; Mallinpatti scale 3/4 Neck: No JVD, no carotid bruits; normal carotid upstroke Lungs: clear to ausculatation and percussion bilaterally; no wheezing or rales, normal inspiratory and expiratory effort Chest wall: without tenderness to palpitation Heart: PMI not displaced, RRR, s1 s2 normal, 1/6 systolic murmur, No diastolic murmur, no rubs, gallops, thrills, or heaves Abdomen: Central adiposity; soft, nontender; no hepatosplenomehaly,  BS+; abdominal aorta nontender and not dilated by palpation. Back: no CVA tenderness Pulses: 2+  Musculoskeletal: full range of motion, normal strength, no joint deformities Extremities: Pulses 2+, no clubbing cyanosis or edema, Homan's sign negative  Neurologic: grossly nonfocal; Cranial nerves grossly wnl Psychologic: Normal mood and affect   LABS:  BMET    Component Value Date/Time   NA 140 03/02/2014 1057   K 4.6 03/02/2014 1057   CL 104 03/02/2014 1057   CO2 29 03/02/2014 1057   GLUCOSE 98 03/02/2014 1057   BUN 17 03/02/2014 1057   CREATININE 0.96 03/02/2014 1057   CREATININE 0.77 03/09/2012 2350   CALCIUM 9.2 03/02/2014 1057   GFRNONAA 80* 03/09/2012 2350   GFRAA >90 03/09/2012 2350     Hepatic Function Panel     Component Value Date/Time   PROT 6.8 03/02/2014 1057   ALBUMIN 3.7 03/02/2014 1057  AST 14 03/02/2014 1057   ALT 11 03/02/2014 1057   ALKPHOS 72 03/02/2014 1057   BILITOT 0.4 03/02/2014 1057     CBC    Component Value Date/Time   WBC 6.3 03/02/2014 1057   RBC 4.96 03/02/2014 1057   HGB 12.3 03/02/2014 1057   HCT 37.9 03/02/2014 1057   PLT 239 03/02/2014 1057   MCV 76.4* 03/02/2014 1057   MCH 24.8* 03/02/2014 1057   MCHC 32.5 03/02/2014 1057   RDW 18.2* 03/02/2014 1057   LYMPHSABS 2.2 03/09/2012 2350   MONOABS 0.5 03/09/2012 2350   EOSABS 0.1 03/09/2012 2350   BASOSABS 0.0 03/09/2012 2350     BNP    Component Value Date/Time   PROBNP 67.9 12/01/2011 1035    Lipid Panel     Component Value Date/Time   CHOL 171 03/02/2014 1057   TRIG 87 03/02/2014 1057   HDL 56 03/02/2014 1057   CHOLHDL 3.1 03/02/2014 1057   VLDL 17 03/02/2014 1057   LDLCALC 98 03/02/2014 1057     RADIOLOGY: No results found.    ASSESSMENT AND PLAN: [name] continues to do well with reference to her cardiac obstructive disease.  She is now 8 years since suffering her acute coronary syndrome and underwent intervention to a totally occluded ramus intermediate vessel.  She's been treated with  medical therapy for her concomitant CAD.  Remotely she had undergone prior stenting to RCA.  Her most recent nuclear perfusion study is unchanged from her prior study of 2012 and continues to show a small defect anterolaterally without reversible ischemia.  Presently, I am recommending slight additional titration of her amlodipine for more improved.  Blood pressure control.  I commended her and her weight loss, but additional weight loss is necessary.  Subsequent blood work will be obtained consisting of CBC, iron studies, CMP, lipid panel, as well as TSH.  I will see her in 6 months for followup evaluation or sooner if problems arise.     Lennette Bihari, MD, [facility]  03/05/2014 3:07 PM

## 2014-03-10 NOTE — Telephone Encounter (Signed)
Encounter complete. 

## 2014-03-16 DIAGNOSIS — M1710 Unilateral primary osteoarthritis, unspecified knee: Secondary | ICD-10-CM | POA: Diagnosis not present

## 2014-03-18 NOTE — Telephone Encounter (Signed)
Encounter complete. 

## 2014-03-19 ENCOUNTER — Telehealth: Payer: Self-pay | Admitting: *Deleted

## 2014-03-19 ENCOUNTER — Other Ambulatory Visit: Payer: Self-pay | Admitting: *Deleted

## 2014-03-19 MED ORDER — POLYSACCHARIDE IRON COMPLEX 150 MG PO CAPS: 150.0000 mg | ORAL_CAPSULE | Freq: Every day | ORAL | Status: DC

## 2014-03-19 NOTE — Telephone Encounter (Signed)
Lab results called to patient.  Voiced understanding and will modify her diet to bring her numbers down.

## 2014-05-11 ENCOUNTER — Other Ambulatory Visit: Payer: Self-pay | Admitting: *Deleted

## 2014-05-11 MED ORDER — ESOMEPRAZOLE MAGNESIUM 40 MG PO CPDR: 40.0000 mg | DELAYED_RELEASE_CAPSULE | Freq: Every day | ORAL | Status: DC

## 2014-05-11 NOTE — Telephone Encounter (Signed)
Rx was sent to pharmacy electronically. 

## 2014-05-26 DIAGNOSIS — Z6835 Body mass index (BMI) 35.0-35.9, adult: Secondary | ICD-10-CM | POA: Diagnosis not present

## 2014-05-26 DIAGNOSIS — N390 Urinary tract infection, site not specified: Secondary | ICD-10-CM | POA: Diagnosis not present

## 2014-07-10 ENCOUNTER — Encounter (HOSPITAL_COMMUNITY): Payer: Self-pay | Admitting: Emergency Medicine

## 2014-07-10 ENCOUNTER — Emergency Department (HOSPITAL_COMMUNITY): Payer: Medicare Other

## 2014-07-10 ENCOUNTER — Observation Stay (HOSPITAL_COMMUNITY)
Admission: EM | Admit: 2014-07-10 | Discharge: 2014-07-13 | Disposition: A | Discharge status: Home / Self Care | Payer: Medicare Other | Attending: Internal Medicine | Admitting: Internal Medicine

## 2014-07-10 DIAGNOSIS — E669 Obesity, unspecified: Secondary | ICD-10-CM | POA: Insufficient documentation

## 2014-07-10 DIAGNOSIS — E785 Hyperlipidemia, unspecified: Secondary | ICD-10-CM | POA: Insufficient documentation

## 2014-07-10 DIAGNOSIS — I25110 Atherosclerotic heart disease of native coronary artery with unstable angina pectoris: Secondary | ICD-10-CM | POA: Diagnosis not present

## 2014-07-10 DIAGNOSIS — Z79899 Other long term (current) drug therapy: Secondary | ICD-10-CM | POA: Insufficient documentation

## 2014-07-10 DIAGNOSIS — R55 Syncope and collapse: Secondary | ICD-10-CM | POA: Diagnosis not present

## 2014-07-10 DIAGNOSIS — I10 Essential (primary) hypertension: Secondary | ICD-10-CM | POA: Insufficient documentation

## 2014-07-10 DIAGNOSIS — I2510 Atherosclerotic heart disease of native coronary artery without angina pectoris: Secondary | ICD-10-CM | POA: Insufficient documentation

## 2014-07-10 DIAGNOSIS — Z7952 Long term (current) use of systemic steroids: Secondary | ICD-10-CM | POA: Diagnosis not present

## 2014-07-10 DIAGNOSIS — R0789 Other chest pain: Secondary | ICD-10-CM

## 2014-07-10 DIAGNOSIS — Z7902 Long term (current) use of antithrombotics/antiplatelets: Secondary | ICD-10-CM | POA: Diagnosis not present

## 2014-07-10 DIAGNOSIS — G4733 Obstructive sleep apnea (adult) (pediatric): Secondary | ICD-10-CM | POA: Diagnosis present

## 2014-07-10 DIAGNOSIS — Z7982 Long term (current) use of aspirin: Secondary | ICD-10-CM | POA: Diagnosis not present

## 2014-07-10 DIAGNOSIS — R079 Chest pain, unspecified: Secondary | ICD-10-CM

## 2014-07-10 DIAGNOSIS — Z6836 Body mass index (BMI) 36.0-36.9, adult: Secondary | ICD-10-CM | POA: Diagnosis not present

## 2014-07-10 DIAGNOSIS — Z955 Presence of coronary angioplasty implant and graft: Secondary | ICD-10-CM | POA: Diagnosis not present

## 2014-07-10 DIAGNOSIS — I200 Unstable angina: Secondary | ICD-10-CM | POA: Diagnosis present

## 2014-07-10 DIAGNOSIS — Z9861 Coronary angioplasty status: Secondary | ICD-10-CM

## 2014-07-10 DIAGNOSIS — I517 Cardiomegaly: Secondary | ICD-10-CM | POA: Diagnosis not present

## 2014-07-10 LAB — COMPREHENSIVE METABOLIC PANEL
ALT: 14 U/L (ref 0–35)
AST: 15 U/L (ref 0–37)
Albumin: 3.6 g/dL (ref 3.5–5.2)
Alkaline Phosphatase: 89 U/L (ref 39–117)
Anion gap: 13 (ref 5–15)
BUN: 13 mg/dL (ref 6–23)
CO2: 23 mEq/L (ref 19–32)
Calcium: 9.5 mg/dL (ref 8.4–10.5)
Chloride: 105 mEq/L (ref 96–112)
Creatinine, Ser: 0.87 mg/dL (ref 0.50–1.10)
GFR calc Af Amer: 72 mL/min — ABNORMAL LOW (ref >90)
GFR calc non Af Amer: 62 mL/min — ABNORMAL LOW (ref >90)
Glucose, Bld: 124 mg/dL — ABNORMAL HIGH (ref 70–99)
Potassium: 3.7 mEq/L (ref 3.7–5.3)
Sodium: 141 mEq/L (ref 137–147)
Total Bilirubin: 0.3 mg/dL (ref 0.3–1.2)
Total Protein: 7.5 g/dL (ref 6.0–8.3)

## 2014-07-10 LAB — CBC WITH DIFFERENTIAL/PLATELET
Basophils Absolute: 0 10*3/uL (ref 0.0–0.1)
Basophils Relative: 1 % (ref 0–1)
Eosinophils Absolute: 0 10*3/uL (ref 0.0–0.7)
Eosinophils Relative: 1 % (ref 0–5)
HCT: 38.4 % (ref 36.0–46.0)
Hemoglobin: 12.9 g/dL (ref 12.0–15.0)
Lymphocytes Relative: 36 % (ref 12–46)
Lymphs Abs: 2.2 10*3/uL (ref 0.7–4.0)
MCH: 26.3 pg (ref 26.0–34.0)
MCHC: 33.6 g/dL (ref 30.0–36.0)
MCV: 78.2 fL (ref 78.0–100.0)
Monocytes Absolute: 0.4 10*3/uL (ref 0.1–1.0)
Monocytes Relative: 7 % (ref 3–12)
Neutro Abs: 3.5 10*3/uL (ref 1.7–7.7)
Neutrophils Relative %: 55 % (ref 43–77)
Platelets: 239 10*3/uL (ref 150–400)
RBC: 4.91 MIL/uL (ref 3.87–5.11)
RDW: 16.2 % — ABNORMAL HIGH (ref 11.5–15.5)
WBC: 6.2 10*3/uL (ref 4.0–10.5)

## 2014-07-10 LAB — TROPONIN I: Troponin I: <0.3 ng/mL (ref <0.30)

## 2014-07-10 MED ORDER — ONDANSETRON 4 MG PO TBDP
8.0000 mg | ORAL_TABLET | Freq: Once | ORAL | Status: AC
  Administered 2014-07-10: 8 mg via ORAL
  Filled 2014-07-10: qty 2

## 2014-07-10 MED ORDER — NITROGLYCERIN 0.4 MG SL SUBL
0.4000 mg | SUBLINGUAL_TABLET | SUBLINGUAL | Status: AC | PRN
  Administered 2014-07-10 (×3): 0.4 mg via SUBLINGUAL
  Filled 2014-07-10: qty 1

## 2014-07-10 MED ORDER — ASPIRIN 81 MG PO CHEW
324.0000 mg | CHEWABLE_TABLET | Freq: Once | ORAL | Status: AC
  Administered 2014-07-10: 324 mg via ORAL
  Filled 2014-07-10: qty 4

## 2014-07-10 NOTE — ED Notes (Signed)
Answered patients call light. Patient stated she was having chest pain and pressure. Notified Dr. Elesa MassedWard, and RN. Completed EKG.

## 2014-07-10 NOTE — ED Notes (Signed)
The pt  Was out shopping with her daughter when she became dizzy and felt like she was going to faint.  On arrival no pain just extremely nauseated.  No pain.  Extremely diaphorectic  At the store

## 2014-07-10 NOTE — H&P (Signed)
[facility] History and Physical  [name] ZOX:[id number] DOB: 09/11/1933 DOA: 07/10/2014  Referring physician: ER physician PCP: [name], [name], [name]   Chief Complaint: Chest pain  HPI:  80 year old female with past medical history of coronary artery disease, status post stents, hypertension, dyslipidemia, obesity, obstructive sleep apnea who presented to [facility] ED 07/10/2014 with sudden onset chest pressure and heaviness with associated shortness of breath, nausea, vomiting and nearly passing out. Patient reported symptoms started while walking and symptoms did not get better with rest. No reports of palpitations. No fevers or chills. No loss of consciousness. Patient reports her chest pressure is better after nitroglycerin given in the emergency room. At this time she has no complaints of shortness of breath. No other complaints of abdominal pain, diarrhea or constipation. No reports of blood in the stool or urine. No GU complaints. In ED, patient is hemodynamically stable with blood pressure of 123/78, heart rate 63, oxygen saturation 94% on room air. 12-lead EKG showed bifascicular block but unchanged from previous EKGs. ED consulted cardiology, Dr. Tresa EndoKelly and [facility] and they will follow in consultation. The troponin level was within normal limits. Chest x-ray showed mildly enlarged heart but no edema or consultation.   Assessment & Plan    Principal Problem:   Chest pain  Rule out ACS. The first troponin was within normal limits. Cycle cardiac enzymes for total of 3 sets.  Appreciate cardiology consult for recommendations.  Patient was given aspirin in ED. Chest pain better after 2 doses of nitroglycerin given in ED.  Continue aspirin and Plavix. Patient is on both medications at home. She has history of stent placement.  Continue oxygen, morphine for chest pain if needed.  Obtain 2-D echo.  Monitor on telemetry, observation Active  Problems:   CAD (coronary artery disease)  As mentioned above, continue aspirin and Plavix because of history of stent placement.  Chest pain better after nitroglycerin.  Continue statin therapy.   Essential hypertension  Resume Norvasc.   Hyperlipidemia with target LDL less than 70  Resume statin therapy  DVT prophylaxis:   Lovenox subcutaneous ordered.  Radiological Exams on Admission: Dg Chest 2 View 07/10/2014  Heart mildly enlarged.  No edema or consolidation.    EKG: Bifascicular block.  Code Status: Full Family Communication: Plan of care discussed with the patient  Disposition Plan: Admit for further evaluation  [name], [name], [name]  [facility] Pager [phone]  Review of Systems:  Constitutional: Negative for fever, chills and malaise/fatigue. Negative for diaphoresis.  HENT: Negative for hearing loss, ear pain, nosebleeds, congestion, sore throat, neck pain, tinnitus and ear discharge.   Eyes: Negative for blurred vision, double vision, photophobia, pain, discharge and redness.  Respiratory: Negative for cough, hemoptysis, sputum production, shortness of breath, wheezing and stridor.   Cardiovascular: per HPI Gastrointestinal: Negative for heartburn, constipation, blood in stool and melena.  Genitourinary: Negative for dysuria, urgency, frequency, hematuria and flank pain.  Musculoskeletal: Negative for myalgias, back pain, joint pain and falls.  Skin: Negative for itching and rash.  Neurological: Negative for dizziness and weakness. Negative for tingling, tremors, sensory change, speech change, focal weakness, loss of consciousness and headaches.  Endo/Heme/Allergies: Negative for environmental allergies and polydipsia. Does not bruise/bleed easily.  Psychiatric/Behavioral: Negative for suicidal ideas. The patient is not nervous/anxious.      Past Medical History  Diagnosis Date  . Coronary artery disease 11/2009    2D Echo EF>55  . Hypertension   .  High cholesterol   . Reflux esophagitis   . Obesity   . Arthritis   . Sleep apnea 09/27/2010    Untreated, REM 64.7/hr AHI 18.5/hr RDI 19.2/hr. Patuent refused CPAP therapy   Past Surgical History  Procedure Laterality Date  . Cholecystectomy    . Coronary angioplasty with stent placement  2007    A 3.0x9618mm CYPHER stent post dilated to 3.29 mm the 100% occlusion was reduced to 0%   Social History:  reports that she has never smoked. She has never used smokeless tobacco. She reports that she does not drink alcohol or use illicit drugs.  Allergies  Allergen Reactions  . Codeine   . Contrast Media [Iodinated Diagnostic Agents]   . Other     There are 3 other allergies but pt was unable to verify what they were    Family History: No family history on file.   Prior to Admission medications   Medication Sig Start Date End Date Taking? Authorizing Provider  amLODipine (NORVASC) 10 MG tablet Take 1 tablet (10 mg total) by mouth daily. 02/10/14  Yes Lennette Biharihomas A Kelly, [name]  aspirin 81 MG tablet Take 81 mg by mouth daily.   Yes Historical Provider, [name]  cholecalciferol (VITAMIN D) 1000 UNITS tablet Take 2,000 Units by mouth daily.   Yes Historical Provider, [name]  clopidogrel (PLAVIX) 75 MG tablet Take 1 tablet (75 mg total) by mouth daily. 11/16/13  Yes Lennette Biharihomas A Kelly, [name]  diazepam (VALIUM) 5 MG tablet Take 5 mg by mouth at bedtime as needed for anxiety.   Yes Historical Provider, [name]  esomeprazole (NEXIUM) 40 MG capsule Take 1 capsule (40 mg total) by mouth daily before breakfast. 05/11/14  Yes Lennette Biharihomas A Kelly, [name]  fluticasone [facility](FLONASE) 50 MCG/ACT nasal spray Place 1 spray into the nose as needed. 12/03/12  Yes Historical Provider, [name]  iron polysaccharides (NIFEREX) 150 MG capsule Take 1 capsule (150 mg total) by mouth daily. 03/19/14  Yes Lennette Biharihomas A Kelly, [name]  rosuvastatin (CRESTOR) 20 MG tablet Take 1 tablet (20 mg total) by mouth daily. 04/20/13  Yes Lennette Biharihomas A Kelly, [name]  nitroGLYCERIN (NITROSTAT) 0.4 MG SL  tablet Place 0.4 mg under the tongue every 5 (five) minutes as needed.    Historical Provider, [name]   Physical Exam: Filed Vitals:   07/10/14 2045 07/10/14 2115 07/10/14 2130 07/10/14 2200  BP: 188/86 163/95 168/80 123/78  Pulse: 63 67 64 81  Temp:      Resp: 21 17 21 11   SpO2: 99% 95% 97% 99%    Physical Exam  Constitutional: Appears well-developed and well-nourished. No distress.  HENT: Normocephalic. No tonsillar erythema or exudates Eyes: Conjunctivae and EOM are normal. PERRLA, no scleral icterus.  Neck: Normal ROM. Neck supple. No JVD. No tracheal deviation. No thyromegaly.  CVS: RRR, S1/S2 appreciated   Pulmonary: Effort and breath sounds normal, no stridor, rhonchi, wheezes, rales.  Abdominal: Soft. BS +,  no distension, tenderness, rebound or guarding.  Musculoskeletal: Normal range of motion. No edema and no tenderness.  Lymphadenopathy: No lymphadenopathy noted, cervical, inguinal. Neuro: Alert. Normal reflexes, muscle tone coordination. No focal neurologic deficits. Skin: Skin is warm and dry. No rash noted. Not diaphoretic. No erythema. No pallor.  Psychiatric: Normal mood and affect. Behavior, judgment, thought content normal.   Labs on Admission:  Basic Metabolic Panel:  Recent Labs Lab 07/10/14 1800  NA 141  K 3.7  CL 105  CO2 23  GLUCOSE 124*  BUN 13  CREATININE  0.87  CALCIUM 9.5   Liver Function Tests:  Recent Labs Lab 07/10/14 1800  AST 15  ALT 14  ALKPHOS 89  BILITOT 0.3  PROT 7.5  ALBUMIN 3.6   No results found for this basename: LIPASE, AMYLASE,  in the last 168 hours No results found for this basename: AMMONIA,  in the last 168 hours CBC:  Recent Labs Lab 07/10/14 1800  WBC 6.2  NEUTROABS 3.5  HGB 12.9  HCT 38.4  MCV 78.2  PLT 239   Cardiac Enzymes:  Recent Labs Lab 07/10/14 1800  TROPONINI <0.30   BNP: No components found with this basename: POCBNP,  CBG: No results found for this basename: GLUCAP,  in the last 168  hours  If 7PM-7AM, please contact night-coverage [email or web address] Password [email or web address] 07/10/2014, 11:09 PM

## 2014-07-10 NOTE — ED Notes (Signed)
Pt reporting central chest heaviness radiating to her back. Dr Elesa Massed informed and new EKG printed and shown to Dr. Elesa Massed.

## 2014-07-10 NOTE — ED Notes (Signed)
The pt last ate at 1100 am today

## 2014-07-10 NOTE — Consult Note (Addendum)
Reason for Consult: chest pain Primary Cardiologist: Dr. Claiborne Billings Referring Physician: Dr. Judson Roch [name] is an 80 y.o. female.  HPI: [name] is a 80 yo woman with PMH of dyslipidemia, hypertension, untreated sleep apnea, GERD and Coronary artery disease with stent in '07 RCA who presents with chest discomfort characterized as heaviness with some associated lightheadedness, diaphoresis, shortness of breath and nausea/vomiting and near syncope earlier today. Her symptoms gradually improved over time. She's had the exact same pain with previous MIs. Some cough but no known fever, no chills. No abdominal swelling. Her LHC revealed 60% distal LCx and mid LAD myocardial bridge.  diate vessel. She did have concomitant CAD with 60% distal circumflex stenosis and mid LAD intramyocardial muscle bridge in. 5/15 myocardial perfusion with irreversible disease and low risk test.   Past Medical History  Diagnosis Date  . Coronary artery disease 11/2009    2D Echo EF>55  . Hypertension   . High cholesterol   . Reflux esophagitis   . Obesity   . Arthritis   . Sleep apnea 09/27/2010    Untreated, REM 64.7/hr AHI 18.5/hr RDI 19.2/hr. Patuent refused CPAP therapy    Past Surgical History  Procedure Laterality Date  . Cholecystectomy    . Coronary angioplasty with stent placement  2007    A 3.0x75m CYPHER stent post dilated to 3.29 mm the 100% occlusion was reduced to 0%    No family history on file.  Social History:  reports that she has never smoked. She has never used smokeless tobacco. She reports that she does not drink alcohol or use illicit drugs.  Allergies:  Allergies  Allergen Reactions  . Codeine   . Contrast Media [Iodinated Diagnostic Agents]   . Other     There are 3 other allergies but pt was unable to verify what they were    Medications:  I have reviewed the patient's current medications. Prior to Admission:  Prescriptions prior to admission  Medication Sig  Dispense Refill Last Dose  . amLODipine (NORVASC) 10 MG tablet Take 1 tablet (10 mg total) by mouth daily. 30 tablet 6 07/09/2014 at Unknown time  . aspirin 81 MG tablet Take 81 mg by mouth daily.   07/09/2014 at Unknown time  . cholecalciferol (VITAMIN D) 1000 UNITS tablet Take 2,000 Units by mouth daily.   07/09/2014 at Unknown time  . clopidogrel (PLAVIX) 75 MG tablet Take 1 tablet (75 mg total) by mouth daily. 30 tablet 7 07/09/2014 at Unknown time  . diazepam (VALIUM) 5 MG tablet Take 5 mg by mouth at bedtime as needed for anxiety.   07/09/2014 at Unknown time  . esomeprazole (NEXIUM) 40 MG capsule Take 1 capsule (40 mg total) by mouth daily before breakfast. 30 capsule 9 07/09/2014 at Unknown time  . fluticasone (FLONASE) 50 MCG/ACT nasal spray Place 1 spray into the nose as needed.   07/09/2014 at Unknown time  . iron polysaccharides (NIFEREX) 150 MG capsule Take 1 capsule (150 mg total) by mouth daily. 30 capsule 5 07/09/2014 at Unknown time  . rosuvastatin (CRESTOR) 20 MG tablet Take 1 tablet (20 mg total) by mouth daily. 30 tablet 6 Past Week at Unknown time  . nitroGLYCERIN (NITROSTAT) 0.4 MG SL tablet Place 0.4 mg under the tongue every 5 (five) minutes as needed.   unknown   Scheduled: . amLODipine  10 mg Oral Daily  . aspirin EC  81 mg Oral Daily  . cholecalciferol  2,000 Units  Oral Daily  . clopidogrel  75 mg Oral Daily  . enoxaparin (LOVENOX) injection  40 mg Subcutaneous Daily  . iron polysaccharides  150 mg Oral Daily  . pantoprazole  40 mg Oral Daily  . rosuvastatin  20 mg Oral Daily  . sodium chloride  3 mL Intravenous Q12H  . sodium chloride  3 mL Intravenous Q12H    Results for orders placed during the hospital encounter of 07/10/14 (from the past 48 hour(s))  CBC WITH DIFFERENTIAL     Status: Abnormal   Collection Time    07/10/14  6:00 PM      Result Value Ref Range   WBC 6.2  4.0 - 10.5 K/uL   RBC 4.91  3.87 - 5.11 MIL/uL   Hemoglobin 12.9  12.0 - 15.0 g/dL    HCT 38.4  36.0 - 46.0 %   MCV 78.2  78.0 - 100.0 fL   MCH 26.3  26.0 - 34.0 pg   MCHC 33.6  30.0 - 36.0 g/dL   RDW 16.2 (*) 11.5 - 15.5 %   Platelets 239  150 - 400 K/uL   Neutrophils Relative % 55  43 - 77 %   Neutro Abs 3.5  1.7 - 7.7 K/uL   Lymphocytes Relative 36  12 - 46 %   Lymphs Abs 2.2  0.7 - 4.0 K/uL   Monocytes Relative 7  3 - 12 %   Monocytes Absolute 0.4  0.1 - 1.0 K/uL   Eosinophils Relative 1  0 - 5 %   Eosinophils Absolute 0.0  0.0 - 0.7 K/uL   Basophils Relative 1  0 - 1 %   Basophils Absolute 0.0  0.0 - 0.1 K/uL  COMPREHENSIVE METABOLIC PANEL     Status: Abnormal   Collection Time    07/10/14  6:00 PM      Result Value Ref Range   Sodium 141  137 - 147 mEq/L   Potassium 3.7  3.7 - 5.3 mEq/L   Chloride 105  96 - 112 mEq/L   CO2 23  19 - 32 mEq/L   Glucose, Bld 124 (*) 70 - 99 mg/dL   BUN 13  6 - 23 mg/dL   Creatinine, Ser 0.87  0.50 - 1.10 mg/dL   Calcium 9.5  8.4 - 10.5 mg/dL   Total Protein 7.5  6.0 - 8.3 g/dL   Albumin 3.6  3.5 - 5.2 g/dL   AST 15  0 - 37 U/L   ALT 14  0 - 35 U/L   Alkaline Phosphatase 89  39 - 117 U/L   Total Bilirubin 0.3  0.3 - 1.2 mg/dL   GFR calc non Af Amer 62 (*) >90 mL/min   GFR calc Af Amer 72 (*) >90 mL/min   Comment: (NOTE)     The eGFR has been calculated using the CKD EPI equation.     This calculation has not been validated in all clinical situations.     eGFR's persistently <90 mL/min signify possible Chronic Kidney     Disease.   Anion gap 13  5 - 15  TROPONIN I     Status: None   Collection Time    07/10/14  6:00 PM      Result Value Ref Range   Troponin I <0.30  <0.30 ng/mL   Comment:            Due to the release kinetics of cTnI,     a negative result within  the first hours     of the onset of symptoms does not rule out     myocardial infarction with certainty.     If myocardial infarction is still suspected,     repeat the test at appropriate intervals.    Dg Chest 2 View  07/10/2014   CLINICAL DATA:   Hypertension and chest pain  EXAM: CHEST  2 VIEW  COMPARISON:  May 11, 2012  FINDINGS: There is no edema or consolidation. Heart is mildly enlarged with pulmonary vascularity within normal limits. No adenopathy. No bone lesions.  IMPRESSION: Heart mildly enlarged.  No edema or consolidation.   Electronically Signed   By: Lowella Grip M.D.   On: 07/10/2014 20:15    Review of Systems  Constitutional: Positive for malaise/fatigue. Negative for fever and chills.  HENT: Negative for ear pain.   Respiratory: Positive for shortness of breath.   Cardiovascular: Positive for chest pain. Negative for claudication.  Gastrointestinal: Positive for heartburn, nausea and vomiting. Negative for blood in stool.  Genitourinary: Negative for dysuria and hematuria.  Musculoskeletal: Negative for myalgias.  Neurological: Positive for dizziness and weakness. Negative for tingling, tremors, sensory change and headaches.  Endo/Heme/Allergies: Negative for polydipsia. Does not bruise/bleed easily.  Psychiatric/Behavioral: Negative for depression, suicidal ideas, hallucinations and substance abuse.   Blood pressure 123/78, pulse 81, temperature 97.7 F (36.5 C), resp. rate 11, SpO2 99.00%. Physical Exam  Nursing note and vitals reviewed. Constitutional: She is oriented to person, place, and time. She appears well-developed and well-nourished. No distress.  HENT:  Head: Normocephalic and atraumatic.  Nose: Nose normal.  Mouth/Throat: Oropharynx is clear and moist. No oropharyngeal exudate.  Eyes: Conjunctivae and EOM are normal. Pupils are equal, round, and reactive to light. No scleral icterus.  Neck: Normal range of motion. Neck supple. JVD present. No tracheal deviation present.  2-3 cm above clavicle  Cardiovascular: Normal rate, regular rhythm, normal heart sounds and intact distal pulses.  Exam reveals no gallop.   No murmur heard. Respiratory: Effort normal and breath sounds normal. No  respiratory distress. She has no wheezes.  Decreased BS at bases  GI: Soft. Bowel sounds are normal. She exhibits no distension. There is no tenderness. There is no rebound.  Musculoskeletal: Normal range of motion. She exhibits edema. She exhibits no tenderness.  Trace edema in ankles  Neurological: She is alert and oriented to person, place, and time. No cranial nerve deficit. Coordination normal.  Skin: Skin is warm and dry. No rash noted. She is not diaphoretic. No erythema.  Psychiatric: Her behavior is normal. Thought content normal.   Labs reviewed; na 141, K 3.7, bun/cr 13/0.87, glucose 124, Trop I <0.1 Chest x-ray: increased vascularity, enlarged heart ECG: sinus bradycardia, LAFB, RBBB, nonspecific ST changes Assessment/Plan: 1. Chest Pain 2. Known CAD 3. Dyslipidemia 4. Untreated obstructive Sleep Apnea 5. Hypertension [name] is a 80 yo woman with PMH of CAD, hypertension, dyslipidemia, OSA who presents with chest pain similar to previous MIs. For now, agree with continued observation. I spoke with [name] and her daughter at length and [name] is not very active and was very active and pushed herself extensively today. Her symptoms may be related to deconditioning. She and her daughter brought up her history of vertigo and thought it could be partially contributory. Her symptoms are probably multifactorial. I am not convinced this is related to coronary artery disease progressive particularly with negative cardiac enzymes and recent unrevealing nuclear perfusion study. Will  start with echocardiogram, observation before further deciding on stress testing vs. Consideration of LHC.  - continue daily aspirin, telemetry, trend cardiac biomarkers - TSH, hba1c, update echocardiogram, urinalysis - BNP, lipid panel - continue aspirin, plavix, crestor, PPI  Alanee Ting 07/10/2014, 11:31 PM

## 2014-07-10 NOTE — ED Provider Notes (Signed)
TIME SEEN: 7:05 PM  CHIEF COMPLAINT: Chest pain, near syncope  HPI: Patient is a 80 year old female with a history of coronary artery disease with 2 prior stents, hypertension, hyperlipidemia, obesity who presents to the emergency department with an episode of chest fullness and heaviness, lightheadedness, diaphoresis, shortness of breath, nausea and vomiting and near syncope while walking through a grocery store today. Symptoms did not improve with rest initially and are now gone. She reports this feels exactly like her prior MIs. She states her last stress test was in June and was risk. She states her last crit catheterization was years ago. She has had a mild cough but no fever recently. No lower external swelling or pain. Denies any headache, vertigo, numbness, tingly or focal weakness.  ROS: See HPI Constitutional: no fever  Eyes: no drainage  ENT: no runny nose   Cardiovascular:  no chest pain  Resp: no SOB  GI: no vomiting GU: no dysuria Integumentary: no rash  Allergy: no hives  Musculoskeletal: no leg swelling  Neurological: no slurred speech ROS otherwise negative  PAST MEDICAL HISTORY/PAST SURGICAL HISTORY:  Past Medical History  Diagnosis Date  . Coronary artery disease 11/2009    2D Echo EF>55  . Hypertension   . High cholesterol   . Reflux esophagitis   . Obesity   . Arthritis   . Sleep apnea 09/27/2010    Untreated, REM 64.7/hr AHI 18.5/hr RDI 19.2/hr. Patuent refused CPAP therapy    MEDICATIONS:  Prior to Admission medications   Medication Sig Start Date End Date Taking? Authorizing Provider  amLODipine (NORVASC) 10 MG tablet Take 1 tablet (10 mg total) by mouth daily. 02/10/14   Lennette Biharihomas A Kelly, MD  aspirin 81 MG tablet Take 81 mg by mouth daily.    Historical Provider, MD  carvedilol (COREG) 12.5 MG tablet Take 25 mg by mouth. Take 25 mg at night, 12.5 mg in the morning    Historical Provider, MD  cholecalciferol (VITAMIN D) 1000 UNITS tablet Take 2,000 Units by  mouth daily.    Historical Provider, MD  clopidogrel (PLAVIX) 75 MG tablet Take 1 tablet (75 mg total) by mouth daily. 11/16/13   Lennette Biharihomas A Kelly, MD  diazepam (VALIUM) 5 MG tablet Take 5 mg by mouth at bedtime as needed for anxiety.    Historical Provider, MD  esomeprazole (NEXIUM) 40 MG capsule Take 1 capsule (40 mg total) by mouth daily before breakfast. 05/11/14   Lennette Biharihomas A Kelly, MD  fluticasone [facility](FLONASE) 50 MCG/ACT nasal spray Place 1 spray into the nose as needed. 12/03/12   Historical Provider, MD  iron polysaccharides (NIFEREX) 150 MG capsule Take 1 capsule (150 mg total) by mouth daily. 03/19/14   Lennette Biharihomas A Kelly, MD  nitroGLYCERIN (NITROSTAT) 0.4 MG SL tablet Place 0.4 mg under the tongue every 5 (five) minutes as needed.    Historical Provider, MD  rosuvastatin (CRESTOR) 20 MG tablet Take 1 tablet (20 mg total) by mouth daily. 04/20/13   Lennette Biharihomas A Kelly, MD    ALLERGIES:  Allergies  Allergen Reactions  . Codeine   . Contrast Media [Iodinated Diagnostic Agents]   . Other     There are 3 other allergies but pt was unable to verify what they were    SOCIAL HISTORY:  History  Substance Use Topics  . Smoking status: Never Smoker   . Smokeless tobacco: Never Used  . Alcohol Use: No    FAMILY HISTORY: No family history on file.  EXAM: BP  177/85  Pulse 67  Temp(Src) 97.7 F (36.5 C)  Resp 18  SpO2 94% CONSTITUTIONAL: Alert and oriented and responds appropriately to questions. Well-appearing; well-nourished HEAD: Normocephalic EYES: Conjunctivae clear, PERRL ENT: normal nose; no rhinorrhea; moist mucous membranes; pharynx without lesions noted NECK: Supple, no meningismus, no LAD  CARD: RRR; S1 and S2 appreciated; no murmurs, no clicks, no rubs, no gallops RESP: Normal chest excursion without splinting or tachypnea; breath sounds clear and equal bilaterally; no wheezes, no rhonchi, no rales,  ABD/GI: Normal bowel sounds; non-distended; soft, non-tender, no rebound, no  guarding BACK:  The back appears normal and is non-tender to palpation, there is no CVA tenderness EXT: Normal ROM in all joints; non-tender to palpation; no edema; normal capillary refill; no cyanosis    SKIN: Normal color for age and race; warm NEURO: Moves all extremities equally, cranial nerves II through XII intact, normal sensation diffusely PSYCH: The patient's mood and manner are appropriate. Grooming and personal hygiene are appropriate.  MEDICAL DECISION MAKING: Patient here with an episode similar to her prior episode of ACS. She has had a recent normal stress test but has a significant cardiac history. Her cardiologist is Dr. Thomas Kelly. EKG shows a bifascicular block which isNicki [place] unchanged. Her first troponin is negative. Orthostatic vital signs are negative. Chest x-ray is clear. We'll discuss with cardiology on-call as patient may need a cardiac catheterization for further evaluation given her symptoms.  ED PROGRESS: Discussed with Dr. Tresa EndoKelly. He thinks patient can go to a medicine service and they will consult. Patient had another episode of chest pain in the ED which is gone after 2 rounds of nitroglycerin. Her EKG was unchanged.   10:07 PM  D/w Dr. Elisabeth Pigeonevine for admission to observation, telemetry.     EKG Interpretation  Date/Time:  Saturday July 10 2014 17:54:19 EDT Ventricular Rate:  76 PR Interval:  176 QRS Duration: 122 QT Interval:  466 QTC Calculation: 524 R Axis:   -75 Text Interpretation:  Sinus rhythm with occasional Premature ventricular complexes Right bundle branch block Left anterior fascicular block Bifasicular block Possible Lateral infarct , age undetermined Abnormal ECG No significant change since last tracing Confirmed by WARD,  DO, KRISTEN [phone]) on 07/10/2014 7:09:40 PM         EKG Interpretation  Date/Time:  Saturday July 10 2014 20:50:25 EDT Ventricular Rate:  56 PR Interval:  203 QRS Duration: 130 QT Interval:  493 QTC  Calculation: 476 R Axis:   -69 Text Interpretation:  Sinus rhythm RBBB and LAFB No significant change since last tracing Confirmed by WARD,  DO, KRISTEN ([id number]) on 07/10/2014 8:53:50 PM        Layla MawKristen N Ward, DO 07/10/14 2208

## 2014-07-11 DIAGNOSIS — I25110 Atherosclerotic heart disease of native coronary artery with unstable angina pectoris: Secondary | ICD-10-CM | POA: Diagnosis not present

## 2014-07-11 DIAGNOSIS — R079 Chest pain, unspecified: Secondary | ICD-10-CM

## 2014-07-11 DIAGNOSIS — I359 Nonrheumatic aortic valve disorder, unspecified: Secondary | ICD-10-CM

## 2014-07-11 DIAGNOSIS — I209 Angina pectoris, unspecified: Secondary | ICD-10-CM

## 2014-07-11 DIAGNOSIS — E785 Hyperlipidemia, unspecified: Secondary | ICD-10-CM | POA: Diagnosis not present

## 2014-07-11 DIAGNOSIS — I10 Essential (primary) hypertension: Secondary | ICD-10-CM | POA: Diagnosis not present

## 2014-07-11 LAB — COMPREHENSIVE METABOLIC PANEL
ALT: 11 U/L (ref 0–35)
AST: 13 U/L (ref 0–37)
Albumin: 3 g/dL — ABNORMAL LOW (ref 3.5–5.2)
Alkaline Phosphatase: 74 U/L (ref 39–117)
Anion gap: 11 (ref 5–15)
BUN: 12 mg/dL (ref 6–23)
CO2: 25 mEq/L (ref 19–32)
Calcium: 8.9 mg/dL (ref 8.4–10.5)
Chloride: 105 mEq/L (ref 96–112)
Creatinine, Ser: 0.91 mg/dL (ref 0.50–1.10)
GFR calc Af Amer: 68 mL/min — ABNORMAL LOW (ref >90)
GFR calc non Af Amer: 59 mL/min — ABNORMAL LOW (ref >90)
Glucose, Bld: 109 mg/dL — ABNORMAL HIGH (ref 70–99)
Potassium: 4.2 mEq/L (ref 3.7–5.3)
Sodium: 141 mEq/L (ref 137–147)
Total Bilirubin: 0.2 mg/dL — ABNORMAL LOW (ref 0.3–1.2)
Total Protein: 6.3 g/dL (ref 6.0–8.3)

## 2014-07-11 LAB — URINALYSIS, ROUTINE W REFLEX MICROSCOPIC
Bilirubin Urine: NEGATIVE
Glucose, UA: NEGATIVE mg/dL
Hgb urine dipstick: NEGATIVE
Ketones, ur: NEGATIVE mg/dL
Nitrite: NEGATIVE
Protein, ur: NEGATIVE mg/dL
Specific Gravity, Urine: 1.009 (ref 1.005–1.030)
Urobilinogen, UA: 1 mg/dL (ref 0.0–1.0)
pH: 6.5 (ref 5.0–8.0)

## 2014-07-11 LAB — CBC
HCT: 34.8 % — ABNORMAL LOW (ref 36.0–46.0)
Hemoglobin: 11.3 g/dL — ABNORMAL LOW (ref 12.0–15.0)
MCH: 25.5 pg — ABNORMAL LOW (ref 26.0–34.0)
MCHC: 32.5 g/dL (ref 30.0–36.0)
MCV: 78.4 fL (ref 78.0–100.0)
Platelets: 216 10*3/uL (ref 150–400)
RBC: 4.44 MIL/uL (ref 3.87–5.11)
RDW: 16.3 % — ABNORMAL HIGH (ref 11.5–15.5)
WBC: 5.9 10*3/uL (ref 4.0–10.5)

## 2014-07-11 LAB — TROPONIN I
Troponin I: <0.3 ng/mL (ref <0.30)
Troponin I: <0.3 ng/mL (ref <0.30)
Troponin I: <0.3 ng/mL (ref <0.30)

## 2014-07-11 LAB — GLUCOSE, CAPILLARY: Glucose-Capillary: 110 mg/dL — ABNORMAL HIGH (ref 70–99)

## 2014-07-11 LAB — URINE MICROSCOPIC-ADD ON

## 2014-07-11 MED ORDER — DIAZEPAM 5 MG PO TABS: 5.0000 mg | ORAL_TABLET | Freq: Every evening | ORAL | Status: DC | PRN

## 2014-07-11 MED ORDER — ONDANSETRON HCL 4 MG/2ML IJ SOLN: 4.0000 mg | Freq: Four times a day (QID) | INTRAMUSCULAR | Status: DC | PRN

## 2014-07-11 MED ORDER — SODIUM CHLORIDE 0.9 % IJ SOLN: 3.0000 mL | INTRAMUSCULAR | Status: DC | PRN

## 2014-07-11 MED ORDER — SODIUM CHLORIDE 0.9 % IV SOLN: INTRAVENOUS | Status: DC

## 2014-07-11 MED ORDER — ANGIOPLASTY BOOK
Freq: Once | Status: DC
  Filled 2014-07-11: qty 1

## 2014-07-11 MED ORDER — ONDANSETRON HCL 4 MG PO TABS: 4.0000 mg | ORAL_TABLET | Freq: Four times a day (QID) | ORAL | Status: DC | PRN

## 2014-07-11 MED ORDER — VITAMIN D 1000 UNITS PO TABS
2000.0000 [IU] | ORAL_TABLET | Freq: Every day | ORAL | Status: DC
  Administered 2014-07-11 – 2014-07-12 (×2): 2000 [IU] via ORAL
  Filled 2014-07-11 (×3): qty 2

## 2014-07-11 MED ORDER — ASPIRIN 81 MG PO CHEW
81.0000 mg | CHEWABLE_TABLET | ORAL | Status: AC
  Administered 2014-07-12: 81 mg via ORAL
  Filled 2014-07-11: qty 1

## 2014-07-11 MED ORDER — POLYSACCHARIDE IRON COMPLEX 150 MG PO CAPS
150.0000 mg | ORAL_CAPSULE | Freq: Every day | ORAL | Status: DC
  Administered 2014-07-12 – 2014-07-13 (×2): 150 mg via ORAL
  Filled 2014-07-11 (×4): qty 1

## 2014-07-11 MED ORDER — ACETAMINOPHEN 650 MG RE SUPP: 650.0000 mg | Freq: Four times a day (QID) | RECTAL | Status: DC | PRN

## 2014-07-11 MED ORDER — SODIUM CHLORIDE 0.9 % IJ SOLN
3.0000 mL | Freq: Two times a day (BID) | INTRAMUSCULAR | Status: DC
  Administered 2014-07-11: 3 mL via INTRAVENOUS

## 2014-07-11 MED ORDER — DIPHENHYDRAMINE HCL 50 MG/ML IJ SOLN
50.0000 mg | Freq: Once | INTRAMUSCULAR | Status: AC
  Administered 2014-07-12: 50 mg via INTRAVENOUS
  Filled 2014-07-11: qty 1

## 2014-07-11 MED ORDER — ASPIRIN EC 81 MG PO TBEC
81.0000 mg | DELAYED_RELEASE_TABLET | Freq: Every day | ORAL | Status: DC
  Administered 2014-07-13: 81 mg via ORAL
  Filled 2014-07-11: qty 1

## 2014-07-11 MED ORDER — SODIUM CHLORIDE 0.9 % IJ SOLN
3.0000 mL | Freq: Two times a day (BID) | INTRAMUSCULAR | Status: DC
  Administered 2014-07-11 – 2014-07-13 (×4): 3 mL via INTRAVENOUS

## 2014-07-11 MED ORDER — ACETAMINOPHEN 325 MG PO TABS: 650.0000 mg | ORAL_TABLET | Freq: Four times a day (QID) | ORAL | Status: DC | PRN

## 2014-07-11 MED ORDER — ENOXAPARIN SODIUM 40 MG/0.4ML ~~LOC~~ SOLN
40.0000 mg | Freq: Every day | SUBCUTANEOUS | Status: DC
  Administered 2014-07-11 – 2014-07-13 (×3): 40 mg via SUBCUTANEOUS
  Filled 2014-07-11 (×3): qty 0.4

## 2014-07-11 MED ORDER — SODIUM CHLORIDE 0.9 % IV SOLN
1.0000 mL/kg/h | INTRAVENOUS | Status: DC
  Administered 2014-07-12: 1 mL/kg/h via INTRAVENOUS

## 2014-07-11 MED ORDER — CLOPIDOGREL BISULFATE 75 MG PO TABS
75.0000 mg | ORAL_TABLET | Freq: Every day | ORAL | Status: DC
  Administered 2014-07-11 – 2014-07-13 (×4): 75 mg via ORAL
  Filled 2014-07-11 (×3): qty 1

## 2014-07-11 MED ORDER — SODIUM CHLORIDE 0.9 % IV SOLN: 250.0000 mL | INTRAVENOUS | Status: DC | PRN

## 2014-07-11 MED ORDER — NITROGLYCERIN 0.4 MG SL SUBL: 0.4000 mg | SUBLINGUAL_TABLET | SUBLINGUAL | Status: DC | PRN

## 2014-07-11 MED ORDER — METHYLPREDNISOLONE SODIUM SUCC 40 MG IJ SOLR
40.0000 mg | Freq: Once | INTRAMUSCULAR | Status: AC
  Administered 2014-07-12: 40 mg via INTRAVENOUS
  Filled 2014-07-11: qty 1

## 2014-07-11 MED ORDER — AMLODIPINE BESYLATE 10 MG PO TABS
10.0000 mg | ORAL_TABLET | Freq: Every day | ORAL | Status: DC
  Administered 2014-07-11 – 2014-07-13 (×4): 10 mg via ORAL
  Filled 2014-07-11 (×3): qty 1

## 2014-07-11 MED ORDER — ROSUVASTATIN CALCIUM 10 MG PO TABS
20.0000 mg | ORAL_TABLET | Freq: Every day | ORAL | Status: DC
  Administered 2014-07-11 – 2014-07-13 (×3): 20 mg via ORAL
  Filled 2014-07-11: qty 2
  Filled 2014-07-11: qty 1
  Filled 2014-07-11 (×2): qty 2

## 2014-07-11 MED ORDER — ASPIRIN EC 81 MG PO TBEC
81.0000 mg | DELAYED_RELEASE_TABLET | Freq: Every day | ORAL | Status: DC
  Administered 2014-07-11: 81 mg via ORAL
  Filled 2014-07-11 (×2): qty 1

## 2014-07-11 MED ORDER — PANTOPRAZOLE SODIUM 40 MG PO TBEC
40.0000 mg | DELAYED_RELEASE_TABLET | Freq: Every day | ORAL | Status: DC
  Administered 2014-07-11 – 2014-07-13 (×3): 40 mg via ORAL
  Filled 2014-07-11 (×3): qty 1

## 2014-07-11 MED ORDER — FAMOTIDINE IN NACL 20-0.9 MG/50ML-% IV SOLN
20.0000 mg | Freq: Once | INTRAVENOUS | Status: AC
  Administered 2014-07-12: 20 mg via INTRAVENOUS
  Filled 2014-07-11: qty 50

## 2014-07-11 MED ORDER — FLUTICASONE PROPIONATE 50 MCG/ACT NA SUSP
1.0000 | NASAL | Status: DC | PRN
  Filled 2014-07-11: qty 16

## 2014-07-11 NOTE — Plan of Care (Signed)
Problem: Consults Goal: Skin Care Protocol Initiated - if Braden Score 18 or less If consults are not indicated, leave blank or document N/A Outcome: Not Applicable Date Met:  83/58/44 Goal: Tobacco Cessation referral if indicated Outcome: Not Applicable Date Met:  65/20/76 Goal: Diabetes Guidelines if Diabetic/Glucose > 140 If diabetic or lab glucose is > 140 mg/dl - Initiate Diabetes/Hyperglycemia Guidelines & Document Interventions  Outcome: Not Applicable Date Met:  19/15/50  Problem: Phase I Progression Outcomes Goal: Hemodynamically stable Outcome: Completed/Met Date Met:  07/11/14 Goal: Anginal pain relieved Outcome: Completed/Met Date Met:  07/11/14 Goal: MD aware of Cardiac Marker results Outcome: Completed/Met Date Met:  07/11/14 Goal: Voiding-avoid urinary catheter unless indicated Outcome: Completed/Met Date Met:  07/11/14 Goal: Other Phase I Outcomes/Goals Outcome: Completed/Met Date Met:  07/11/14

## 2014-07-11 NOTE — Progress Notes (Signed)
Patient ID: [name], female   DOB: 1934-07-10, 80 y.o.   MRN: [id number]   Patient Name: [name] Date of Encounter: 07/11/2014     Principal Problem:   Chest pain Active Problems:   CAD (coronary artery disease)   Essential hypertension   Obesity (BMI 30-39.9)   Hyperlipidemia with target LDL less than 70   Obstructive sleep apnea    SUBJECTIVE  Chest pressure, diaphoresis and sob have resolved.   CURRENT MEDS . amLODipine  10 mg Oral Daily  . aspirin EC  81 mg Oral Daily  . cholecalciferol  2,000 Units Oral Daily  . clopidogrel  75 mg Oral Daily  . enoxaparin (LOVENOX) injection  40 mg Subcutaneous Daily  . iron polysaccharides  150 mg Oral Daily  . pantoprazole  40 mg Oral Daily  . rosuvastatin  20 mg Oral Daily  . sodium chloride  3 mL Intravenous Q12H  . sodium chloride  3 mL Intravenous Q12H    OBJECTIVE  Filed Vitals:   07/10/14 2315 07/10/14 2345 07/11/14 0130 07/11/14 0500  BP: 177/69 152/82 178/88 143/55  Pulse: 63 57 60 67  Temp:   97.6 F (36.4 C) 98.1 F (36.7 C)  TempSrc:   Oral   Resp: Height:    (1.626 m)   Weight:   216 lb 6.4 oz (98.158 kg)   SpO2: 97% 98% 99% 98%    Intake/Output Summary (Last 24 hours) at 07/11/14 1056 Last data filed at 07/11/14 0911  Gross per 24 hour  Intake    120 ml  Output    100 ml  Net     20 ml   Filed Weights   07/11/14 0130  Weight: 216 lb 6.4 oz (98.158 kg)    PHYSICAL EXAM  General: Pleasant, obese 80 yo woman, NAD. Neuro: Alert and oriented X 3. Moves all extremities spontaneously. HEENT:  Normal  Neck: Supple without bruits or JVD. Lungs:  Resp regular and unlabored, CTA. Heart: RRR no s3, s4, or murmurs. Abdomen: Soft, non-tender, non-distended, BS + x 4.  Extremities: No clubbing, cyanosis or edema. DP/PT/Radials 2+ and equal bilaterally.  Accessory Clinical Findings  CBC  Recent Labs  07/10/14 1800 07/11/14 0530  WBC 6.2 5.9  NEUTROABS 3.5  --   HGB  12.9 11.3*  HCT 38.4 34.8*  MCV 78.2 78.4  PLT 239 216   Basic Metabolic Panel  Recent Labs  07/10/14 1800 07/11/14 0530  NA 141 141  K 3.7 4.2  CL 105 105  CO2 23 25  GLUCOSE 124* 109*  BUN 13 12  CREATININE 0.87 0.91  CALCIUM 9.5 8.9   Liver Function Tests  Recent Labs  07/10/14 1800 07/11/14 0530  AST 15 13  ALT 14 11  ALKPHOS 89 74  BILITOT 0.3 0.2*  PROT 7.5 6.3  ALBUMIN 3.6 3.0*   No results for input(s): LIPASE, AMYLASE in the last 72 hours. Cardiac Enzymes  Recent Labs  07/10/14 1800 07/11/14 0530  TROPONINI <0.30 <0.30   BNP Invalid input(s): POCBNP D-Dimer No results for input(s): DDIMER in the last 72 hours. Hemoglobin A1C No results for input(s): HGBA1C in the last 72 hours. Fasting Lipid Panel No results for input(s): CHOL, HDL, LDLCALC, TRIG, CHOLHDL, LDLDIRECT in the last 72 hours. Thyroid Function Tests No results for input(s): TSH, T4TOTAL, T3FREE, THYROIDAB in the last 72 hours.  Invalid input(s): FREET3  TELE  nsr  ECG  reviewed  Radiology/Studies  Dg Chest 2 View  07/10/2014   CLINICAL DATA:  Hypertension and chest pain  EXAM: CHEST  2 VIEW  COMPARISON:  May 11, 2012  FINDINGS: There is no edema or consolidation. Heart is mildly enlarged with pulmonary vascularity within normal limits. No adenopathy. No bone lesions.  IMPRESSION: Heart mildly enlarged.  No edema or consolidation.   Electronically Signed   By: Bretta BangWilliam  Woodruff M.D.   On: 07/10/2014 20:15    ASSESSMENT AND PLAN  1. Chest pain/ very worrisome for ACS 2. Known CAD, s/p PCI 3. HTN 4. Dyslipidemia 5. obesity Rec: Her symptoms are identical to those of [place] when [place]. I have discussed the treatment options and recommended proceeding with left heart catheterization. Her abnormal ECG, morbid obesity, and inability to walk make a stress perfusion test insufficiently accurate to rule out a new lesion in her coronary  vasculature.  Jerred Zaremba,M.D.  07/11/2014 10:56 AM

## 2014-07-11 NOTE — Progress Notes (Signed)
Utilization review completed.  

## 2014-07-11 NOTE — Progress Notes (Signed)
Echocardiogram 2D Echocardiogram has been performed.  Estelle GrumblesMyers, Tekoa Hamor J 07/11/2014, 10:40 AM

## 2014-07-11 NOTE — Plan of Care (Signed)
Problem: Phase I Progression Outcomes Goal: Aspirin unless contraindicated Outcome: Completed/Met Date Met:  07/11/14

## 2014-07-11 NOTE — Plan of Care (Signed)
Problem: Phase II Progression Outcomes Goal: Hemodynamically stable Outcome: Completed/Met Date Met:  07/11/14 Goal: Anginal pain relieved Outcome: Completed/Met Date Met:  07/11/14 Goal: Cath/PCI Day Path if indicated Outcome: Progressing PT FOR CARDIAC CATH IN AM Goal: If positive for MI, change to MI Path Outcome: Completed/Met Date Met:  07/11/14 TROPONINS (-) X 3

## 2014-07-11 NOTE — Progress Notes (Signed)
[facility] PROGRESS NOTE  [name]:[id number] DOB: 1934-05-07 DOA: 07/10/2014 PCP: Colette Ribas, MD Interim summary: 80 year old female with past medical history of coronary artery disease, status post stents, hypertension, dyslipidemia, obesity, obstructive sleep apnea who presented to [facility] ED 07/10/2014 with sudden onset chest pressure and heaviness with associated shortness of breath, nausea, vomiting and nearly passing out. Patient reported symptoms started while walking and symptoms did not get better with rest. Admitted for acs . Cardiology consulted.  Assessment/Plan: Chest pain  troponins negative. EKG unchanged. Plan for cardiac cath in the near future.   Appreciate cardiology consult for recommendations.  Continue aspirin and Plavix. Patient is on both medications at home. She has history of stent placement.  Continue oxygen, morphine for chest pain if needed.  Obtain 2-D echo.  Active Problems:  CAD (coronary artery disease)  As mentioned above, continue aspirin and Plavix because of history of stent placement.  Chest pain  Resolved.  after nitroglycerin.  Continue statin therapy.  Essential hypertension  Resume Norvasc.  Hyperlipidemia with target LDL less than 70  Resume statin therapy  Code Status: full code.  Family Communication family at bedside.  Disposition Plan: pending further investigation.    Consultants:  cardiology  Procedures:  none  Antibiotics: none HPI/Subjective: Currently chest pain free, wants to go home.   Objective: Filed Vitals:   07/11/14 1126  BP: 123/40  Pulse:   Temp:   Resp:     Intake/Output Summary (Last 24 hours) at 07/11/14 1813 Last data filed at 07/11/14 1126  Gross per 24 hour  Intake    126 ml  Output    100 ml  Net     26 ml   Filed Weights   07/11/14 0130  Weight: 98.158 kg (216 lb 6.4 oz)    Exam:   General:  Alert afebrile comfortable  Cardiovascular:  s1s2  Respiratory: ctab  Abdomen: soft NT Nd BS+  Musculoskeletal: PEDAL edema.  Data Reviewed: Basic Metabolic Panel:  Recent Labs Lab 07/10/14 1800 07/11/14 0530  NA 141 141  K 3.7 4.2  CL 105 105  CO2 23 25  GLUCOSE 124* 109*  BUN 13 12  CREATININE 0.87 0.91  CALCIUM 9.5 8.9   Liver Function Tests:  Recent Labs Lab 07/10/14 1800 07/11/14 0530  AST 15 13  ALT 14 11  ALKPHOS 89 74  BILITOT 0.3 0.2*  PROT 7.5 6.3  ALBUMIN 3.6 3.0*   No results for input(s): LIPASE, AMYLASE in the last 168 hours. No results for input(s): AMMONIA in the last 168 hours. CBC:  Recent Labs Lab 07/10/14 1800 07/11/14 0530  WBC 6.2 5.9  NEUTROABS 3.5  --   HGB 12.9 11.3*  HCT 38.4 34.8*  MCV 78.2 78.4  PLT 239 216   Cardiac Enzymes:  Recent Labs Lab 07/10/14 1800 07/11/14 0530 07/11/14 1120 07/11/14 1556  TROPONINI <0.30 <0.30 <0.30 <0.30   BNP (last 3 results) No results for input(s): PROBNP in the last 8760 hours. CBG:  Recent Labs Lab 07/11/14 0819  GLUCAP 110*    No results found for this or any previous visit (from the past 240 hour(s)).   Studies: Dg Chest 2 View  07/10/2014   CLINICAL DATA:  Hypertension and chest pain  EXAM: CHEST  2 VIEW  COMPARISON:  May 11, 2012  FINDINGS: There is no edema or consolidation. Heart is mildly enlarged with pulmonary vascularity within normal limits. No adenopathy. No bone lesions.  IMPRESSION: Heart  mildly enlarged.  No edema or consolidation.   Electronically Signed   By: Bretta BangWilliam  Woodruff M.D.   On: 07/10/2014 20:15    Scheduled Meds: . amLODipine  10 mg Oral Daily  . angioplasty book   Does not apply Once  . [START ON 07/12/2014] aspirin  81 mg Oral Pre-Cath  . [START ON 07/13/2014] aspirin EC  81 mg Oral Daily  . cholecalciferol  2,000 Units Oral Daily  . clopidogrel  75 mg Oral Daily  . [START ON 07/12/2014] diphenhydrAMINE  50 mg Intravenous Once  . enoxaparin (LOVENOX) injection  40 mg Subcutaneous  Daily  . [START ON 07/12/2014] famotidine (PEPCID) IV  20 mg Intravenous Once  . iron polysaccharides  150 mg Oral Daily  . [START ON 07/12/2014] methylPREDNISolone (SOLU-MEDROL) injection  40 mg Intravenous Once  . pantoprazole  40 mg Oral Daily  . rosuvastatin  20 mg Oral Daily  . sodium chloride  3 mL Intravenous Q12H  . sodium chloride  3 mL Intravenous Q12H  . sodium chloride  3 mL Intravenous Q12H   Continuous Infusions: . sodium chloride    . [START ON 07/12/2014] sodium chloride      Principal Problem:   Chest pain Active Problems:   CAD (coronary artery disease)   Essential hypertension   Obesity (BMI 30-39.9)   Hyperlipidemia with target LDL less than 70   Obstructive sleep apnea    Time spent: 25 minutes.     [facility] Hospital - La JollaKULA,Jenesa Foresta  [facility] Pager [phone]. If 7PM-7AM, please contact night-coverage at [email or web address], password [email or web address] 07/11/2014, 6:13 PM  LOS: 1 day

## 2014-07-12 ENCOUNTER — Encounter (HOSPITAL_COMMUNITY)
Admission: EM | Disposition: A | Discharge status: Home / Self Care | Payer: Self-pay | Source: Home / Self Care | Attending: Internal Medicine

## 2014-07-12 DIAGNOSIS — I10 Essential (primary) hypertension: Secondary | ICD-10-CM | POA: Diagnosis not present

## 2014-07-12 DIAGNOSIS — I25110 Atherosclerotic heart disease of native coronary artery with unstable angina pectoris: Secondary | ICD-10-CM | POA: Diagnosis not present

## 2014-07-12 DIAGNOSIS — Z9861 Coronary angioplasty status: Secondary | ICD-10-CM

## 2014-07-12 DIAGNOSIS — I2510 Atherosclerotic heart disease of native coronary artery without angina pectoris: Secondary | ICD-10-CM | POA: Diagnosis not present

## 2014-07-12 DIAGNOSIS — E785 Hyperlipidemia, unspecified: Secondary | ICD-10-CM | POA: Diagnosis not present

## 2014-07-12 HISTORY — PX: LEFT HEART CATHETERIZATION WITH CORONARY ANGIOGRAM: SHX5451

## 2014-07-12 LAB — PROTIME-INR
INR: 1.18 (ref 0.00–1.49)
Prothrombin Time: 15.1 seconds (ref 11.6–15.2)

## 2014-07-12 LAB — GLUCOSE, CAPILLARY: Glucose-Capillary: 110 mg/dL — ABNORMAL HIGH (ref 70–99)

## 2014-07-12 SURGERY — LEFT HEART CATHETERIZATION WITH CORONARY ANGIOGRAM
Anesthesia: LOCAL

## 2014-07-12 MED ORDER — VERAPAMIL HCL 2.5 MG/ML IV SOLN
INTRAVENOUS | Status: AC
  Filled 2014-07-12: qty 2

## 2014-07-12 MED ORDER — HEPARIN (PORCINE) IN NACL 2-0.9 UNIT/ML-% IJ SOLN
INTRAMUSCULAR | Status: AC
  Filled 2014-07-12: qty 1500

## 2014-07-12 MED ORDER — FENTANYL CITRATE 0.05 MG/ML IJ SOLN
INTRAMUSCULAR | Status: AC
  Filled 2014-07-12: qty 2

## 2014-07-12 MED ORDER — NITROGLYCERIN 1 MG/10 ML FOR IR/CATH LAB
INTRA_ARTERIAL | Status: AC
Start: 2014-07-12 — End: 2014-07-12
  Filled 2014-07-12: qty 10

## 2014-07-12 MED ORDER — SODIUM CHLORIDE 0.9 % IV SOLN
1.0000 mL/kg/h | INTRAVENOUS | Status: AC
  Administered 2014-07-12: 1 mL/kg/h via INTRAVENOUS

## 2014-07-12 MED ORDER — MIDAZOLAM HCL 2 MG/2ML IJ SOLN
INTRAMUSCULAR | Status: AC
  Filled 2014-07-12: qty 2

## 2014-07-12 MED ORDER — SODIUM CHLORIDE 0.9 % IJ SOLN
3.0000 mL | Freq: Two times a day (BID) | INTRAMUSCULAR | Status: DC
  Administered 2014-07-13: 3 mL via INTRAVENOUS

## 2014-07-12 MED ORDER — LIDOCAINE HCL (PF) 1 % IJ SOLN
INTRAMUSCULAR | Status: AC
  Filled 2014-07-12: qty 30

## 2014-07-12 MED ORDER — SODIUM CHLORIDE 0.9 % IV SOLN: 250.0000 mL | INTRAVENOUS | Status: DC | PRN

## 2014-07-12 MED ORDER — HEPARIN SODIUM (PORCINE) 1000 UNIT/ML IJ SOLN
INTRAMUSCULAR | Status: AC
  Filled 2014-07-12: qty 1

## 2014-07-12 MED ORDER — SODIUM CHLORIDE 0.9 % IJ SOLN: 3.0000 mL | INTRAMUSCULAR | Status: DC | PRN

## 2014-07-12 NOTE — Progress Notes (Signed)
UR completed 

## 2014-07-12 NOTE — CV Procedure (Signed)
    Cardiac Catheterization Procedure Note  Name: [name] Rinneearly M Copland MRN: [id number] DOB: 05/27/1934  Procedure: Left Heart Cath, Selective Coronary Angiography  Indication: Chest pain concerning for USAP   Procedural Details: The right wrist was prepped, draped, and anesthetized with 1% lidocaine. Using the modified Seldinger technique, a 5/6 French Slender sheath was introduced into the right radial artery. 3 mg of verapamil was administered through the sheath, weight-based unfractionated heparin was administered intravenously. Standard Judkins catheters were used for selective coronary angiography. Catheter exchanges were performed over an exchange length guidewire. There were no immediate procedural complications. A TR band was used for radial hemostasis at the completion of the procedure.  The patient was transferred to the post catheterization recovery area for further monitoring.  Procedural Findings: Hemodynamics: AO 183/75 LV 181/75  Coronary angiography: Coronary dominance: right  Left mainstem: Widely patent with nonobstructive irregularity and mild calcification  Left anterior descending (LAD): Prox vessel is widely patent. There is a large first diagonal without stenosis. The mid-vessel beyond the diagonal has an intramyocardial bridge. There is no obstructive disease in the LAD distribution.  Left circumflex (LCx): The ramus branch is patent with a widely patent stent. The circumflex is a dominant vessel with patent OM branches and a stable 50% stenosis in the distal AV circumflex before the left PDA. No change compared with the 2007 study.  Right coronary artery (RCA): Moderate caliber, nondominant vessel with a patent stent in the mid-vessel.  Left ventriculography: deferred. LVEF 75% by echo  Estimated Blood Loss: minimal  Final Conclusions:   1. Double vessel  CAD with patent stents in the ramus and RCA 2. Widely patent LAD 3. Normal LV function by  echo  Recommendations: continue medical therapy for CAD  Tonny BollmanMichael Ericson Nafziger MD, [facility] 07/12/2014, 12:34 PM

## 2014-07-12 NOTE — Progress Notes (Signed)
[facility] PROGRESS NOTE  [name] ZOX:[id number] DOB: 03/21/1934 DOA: 07/10/2014 PCP: Colette RibasGOLDING, [name] [name], [name] Interim summary: 80 year old female with past medical history of coronary artery disease, status post stents, hypertension, dyslipidemia, obesity, obstructive sleep apnea who [facility] with sudden onset chest pressure and heaviness with associated shortness of breath, nausea, vomiting and nearly passing out. Patient reported symptoms started while walking and symptoms did not get better with rest. Admitted for acs . Cardiology consulted.  Assessment/Plan: Chest pain  troponins negative. EKG unchanged. Plan for cardiac cath today.  Appreciate cardiology consult for recommendations.  Continue aspirin and Plavix. Patient is on both medications at home. She has history of stent placement.  Continue oxygen, morphine for chest pain if needed.  Obtain 2-D echo.  Active Problems:  CAD (coronary artery disease)  As mentioned above, continue aspirin and Plavix because of history of stent placement.  Chest pain  Resolved.  after nitroglycerin.  Continue statin therapy.  Essential hypertension  Resume Norvasc.  Hyperlipidemia with target LDL less than 70  Resume statin therapy  Code Status: full code.  Family Communication family at bedside.  Disposition Plan: pending further investigation.    Consultants:  cardiology  Procedures:  none  Antibiotics: none HPI/Subjective: Currently chest pain free, no nausea or vomiting.  Objective: Filed Vitals:   07/12/14 0500  BP: 157/75  Pulse: 65  Temp: 98.8 F (37.1 C)  Resp: 18    Intake/Output Summary (Last 24 hours) at 07/12/14 1107 Last data filed at 07/12/14 [id number]  Gross per 24 hour  Intake    126 ml  Output    725 ml  Net   -599 ml   Filed Weights   07/11/14 0130  Weight: 98.158 kg (216 lb 6.4 oz)    Exam:   General:  Alert afebrile comfortable  Cardiovascular:  s1s2  Respiratory: ctab  Abdomen: soft NT Nd BS+  Musculoskeletal: PEDAL edema.  Data Reviewed: Basic Metabolic Panel:  Recent Labs Lab 07/10/14 1800 07/11/14 0530  NA 141 141  K 3.7 4.2  CL 105 105  CO2 23 25  GLUCOSE 124* 109*  BUN 13 12  CREATININE 0.87 0.91  CALCIUM 9.5 8.9   Liver Function Tests:  Recent Labs Lab 07/10/14 1800 07/11/14 0530  AST 15 13  ALT 14 11  ALKPHOS 89 74  BILITOT 0.3 0.2*  PROT 7.5 6.3  ALBUMIN 3.6 3.0*   No results for input(s): LIPASE, AMYLASE in the last 168 hours. No results for input(s): AMMONIA in the last 168 hours. CBC:  Recent Labs Lab 07/10/14 1800 07/11/14 0530  WBC 6.2 5.9  NEUTROABS 3.5  --   HGB 12.9 11.3*  HCT 38.4 34.8*  MCV 78.2 78.4  PLT 239 216   Cardiac Enzymes:  Recent Labs Lab 07/10/14 1800 07/11/14 0530 07/11/14 1120 07/11/14 1556  TROPONINI <0.30 <0.30 <0.30 <0.30   BNP (last 3 results) No results for input(s): PROBNP in the last 8760 hours. CBG:  Recent Labs Lab 07/11/14 0819 07/12/14 0756  GLUCAP 110* 110*    No results found for this or any previous visit (from the past 240 hour(s)).   Studies: Dg Chest 2 View  07/10/2014   CLINICAL DATA:  Hypertension and chest pain  EXAM: CHEST  2 VIEW  COMPARISON:  May 11, 2012  FINDINGS: There is no edema or consolidation. Heart is mildly enlarged with pulmonary vascularity within normal limits. No adenopathy. No bone lesions.  IMPRESSION: Heart mildly  enlarged.  No edema or consolidation.   Electronically Signed   By: Bretta BangWilliam  Woodruff M.D.   On: 07/10/2014 20:15    Scheduled Meds: . amLODipine  10 mg Oral Daily  . angioplasty book   Does not apply Once  . [START ON 07/13/2014] aspirin EC  81 mg Oral Daily  . cholecalciferol  2,000 Units Oral Daily  . clopidogrel  75 mg Oral Daily  . diphenhydrAMINE  50 mg Intravenous Once  . enoxaparin (LOVENOX) injection  40 mg Subcutaneous Daily  . famotidine (PEPCID) IV  20 mg Intravenous Once   . iron polysaccharides  150 mg Oral Daily  . methylPREDNISolone (SOLU-MEDROL) injection  40 mg Intravenous Once  . pantoprazole  40 mg Oral Daily  . rosuvastatin  20 mg Oral Daily  . sodium chloride  3 mL Intravenous Q12H  . sodium chloride  3 mL Intravenous Q12H  . sodium chloride  3 mL Intravenous Q12H   Continuous Infusions: . sodium chloride    . sodium chloride 1 mL/kg/hr (07/12/14 0430)    Principal Problem:   Unstable angina Active Problems:   CAD (coronary artery disease)   Essential hypertension   Obesity (BMI 30-39.9)   Hyperlipidemia with target LDL less than 70   Obstructive sleep apnea   Coronary artery disease involving native coronary artery of native heart without angina pectoris    Time spent: 25 minutes.     [facility] LLCKULA,Sheli Dorin  [facility] Pager [phone]. If 7PM-7AM, please contact night-coverage at [email or web address], password [email or web address] 07/12/2014, 11:07 AM  LOS: 2 days

## 2014-07-12 NOTE — Plan of Care (Signed)
Problem: Consults Goal: Nutrition Consult-if indicated Outcome: Not Applicable Date Met:  64/33/29  Problem: Phase II Progression Outcomes Goal: Stress Test if indicated Outcome: Not Applicable Date Met:  51/88/41 Goal: CV Risk Factors identified Outcome: Completed/Met Date Met:  07/12/14 Goal: Other Phase II Outcomes/Goals Outcome: Completed/Met Date Met:  07/12/14

## 2014-07-12 NOTE — H&P (View-Only) (Signed)
Patient ID: [name], female   DOB: 04/02/1934, 80 y.o.   MRN: [id number]   Patient Name: [name] Date of Encounter: 07/11/2014     Principal Problem:   Chest pain Active Problems:   CAD (coronary artery disease)   Essential hypertension   Obesity (BMI 30-39.9)   Hyperlipidemia with target LDL less than 70   Obstructive sleep apnea    SUBJECTIVE  Chest pressure, diaphoresis and sob have resolved.   CURRENT MEDS . amLODipine  10 mg Oral Daily  . aspirin EC  81 mg Oral Daily  . cholecalciferol  2,000 Units Oral Daily  . clopidogrel  75 mg Oral Daily  . enoxaparin (LOVENOX) injection  40 mg Subcutaneous Daily  . iron polysaccharides  150 mg Oral Daily  . pantoprazole  40 mg Oral Daily  . rosuvastatin  20 mg Oral Daily  . sodium chloride  3 mL Intravenous Q12H  . sodium chloride  3 mL Intravenous Q12H    OBJECTIVE  Filed Vitals:   07/10/14 2315 07/10/14 2345 07/11/14 0130 07/11/14 0500  BP: 177/69 152/82 178/88 143/55  Pulse: 63 57 60 67  Temp:   97.6 F (36.4 C) 98.1 F (36.7 C)  TempSrc:   Oral   Resp: 16 19 18 18  Height:   5' 4" (1.626 m)   Weight:   216 lb 6.4 oz (98.158 kg)   SpO2: 97% 98% 99% 98%    Intake/Output Summary (Last 24 hours) at 07/11/14 1056 Last data filed at 07/11/14 0911  Gross per 24 hour  Intake    120 ml  Output    100 ml  Net     20 ml   Filed Weights   07/11/14 0130  Weight: 216 lb 6.4 oz (98.158 kg)    PHYSICAL EXAM  General: Pleasant, obese 80 yo woman, NAD. Neuro: Alert and oriented X 3. Moves all extremities spontaneously. HEENT:  Normal  Neck: Supple without bruits or JVD. Lungs:  Resp regular and unlabored, CTA. Heart: RRR no s3, s4, or murmurs. Abdomen: Soft, non-tender, non-distended, BS + x 4.  Extremities: No clubbing, cyanosis or edema. DP/PT/Radials 2+ and equal bilaterally.  Accessory Clinical Findings  CBC  Recent Labs  07/10/14 1800 07/11/14 0530  WBC 6.2 5.9  NEUTROABS 3.5  --   HGB  12.9 11.3*  HCT 38.4 34.8*  MCV 78.2 78.4  PLT 239 216   Basic Metabolic Panel  Recent Labs  07/10/14 1800 07/11/14 0530  NA 141 141  K 3.7 4.2  CL 105 105  CO2 23 25  GLUCOSE 124* 109*  BUN 13 12  CREATININE 0.87 0.91  CALCIUM 9.5 8.9   Liver Function Tests  Recent Labs  07/10/14 1800 07/11/14 0530  AST 15 13  ALT 14 11  ALKPHOS 89 74  BILITOT 0.3 0.2*  PROT 7.5 6.3  ALBUMIN 3.6 3.0*   No results for input(s): LIPASE, AMYLASE in the last 72 hours. Cardiac Enzymes  Recent Labs  07/10/14 1800 07/11/14 0530  TROPONINI <0.30 <0.30   BNP Invalid input(s): POCBNP D-Dimer No results for input(s): DDIMER in the last 72 hours. Hemoglobin A1C No results for input(s): HGBA1C in the last 72 hours. Fasting Lipid Panel No results for input(s): CHOL, HDL, LDLCALC, TRIG, CHOLHDL, LDLDIRECT in the last 72 hours. Thyroid Function Tests No results for input(s): TSH, T4TOTAL, T3FREE, THYROIDAB in the last 72 hours.  Invalid input(s): FREET3  TELE  nsr  ECG  reviewed    Radiology/Studies  Dg Chest 2 View  07/10/2014   CLINICAL DATA:  Hypertension and chest pain  EXAM: CHEST  2 VIEW  COMPARISON:  May 11, 2012  FINDINGS: There is no edema or consolidation. Heart is mildly enlarged with pulmonary vascularity within normal limits. No adenopathy. No bone lesions.  IMPRESSION: Heart mildly enlarged.  No edema or consolidation.   Electronically Signed   By: William  Woodruff M.D.   On: 07/10/2014 20:15    ASSESSMENT AND PLAN  1. Chest pain/ very worrisome for ACS 2. Known CAD, s/p PCI 3. HTN 4. Dyslipidemia 5. obesity Rec: Her symptoms are identical to those of her prior [place] when she required stenting. I have discussed the treatment options and recommended proceeding with left heart catheterization. Her abnormal ECG, morbid obesity, and inability to walk make a stress perfusion test insufficiently accurate to rule out a new lesion in her coronary  vasculature.  [name],M.D.  07/11/2014 10:56 AM 

## 2014-07-12 NOTE — Interval H&P Note (Signed)
History and Physical Interval Note:  07/12/2014 11:58 AM  [name]  has presented today for surgery, with the diagnosis of unstable angina  The various methods of treatment have been discussed with the patient and family. After consideration of risks, benefits and other options for treatment, the patient has consented to  Procedure(s): LEFT HEART CATHETERIZATION WITH CORONARY ANGIOGRAM (N/A) as a surgical intervention .  The patient's history has been reviewed, patient examined, no change in status, stable for surgery.  I have reviewed the patient's chart and labs.  Questions were answered to the patient's satisfaction.    Cath Lab Visit (complete for each Cath Lab visit)  Clinical Evaluation Leading to the Procedure:   ACS: Yes.    Non-ACS:    Anginal Classification: CCS IV  Anti-ischemic medical therapy: Minimal Therapy (1 class of medications)  Non-Invasive Test Results: No non-invasive testing performed  Prior CABG: No previous CABG       [name]

## 2014-07-12 NOTE — Progress Notes (Signed)
Patient Name: [name] Date of Encounter: 07/12/2014     Principal Problem:   Chest pain Active Problems:   CAD (coronary artery disease)   Essential hypertension   Obesity (BMI 30-39.9)   Hyperlipidemia with target LDL less than 70   Obstructive sleep apnea    SUBJECTIVE  No CP. No complaints. Awaiting cardiac cath.   CURRENT MEDS . amLODipine  10 mg Oral Daily  . angioplasty book   Does not apply Once  . [START ON 07/13/2014] aspirin EC  81 mg Oral Daily  . cholecalciferol  2,000 Units Oral Daily  . clopidogrel  75 mg Oral Daily  . diphenhydrAMINE  50 mg Intravenous Once  . enoxaparin (LOVENOX) injection  40 mg Subcutaneous Daily  . famotidine (PEPCID) IV  20 mg Intravenous Once  . iron polysaccharides  150 mg Oral Daily  . methylPREDNISolone (SOLU-MEDROL) injection  40 mg Intravenous Once  . pantoprazole  40 mg Oral Daily  . rosuvastatin  20 mg Oral Daily  . sodium chloride  3 mL Intravenous Q12H  . sodium chloride  3 mL Intravenous Q12H  . sodium chloride  3 mL Intravenous Q12H    OBJECTIVE  Filed Vitals:   07/11/14 0500 07/11/14 1126 07/11/14 2100 07/12/14 0500  BP: 143/55 123/40 146/65 157/75  Pulse: 67  75 65  Temp: 98.1 F (36.7 C)  98.7 F (37.1 C) 98.8 F (37.1 C)  TempSrc:      Resp: 18  18 18   Height:      Weight:      SpO2: 98%  96% 95%    Intake/Output Summary (Last 24 hours) at 07/12/14 0917 Last data filed at 07/12/14 0814  Gross per 24 hour  Intake    126 ml  Output    725 ml  Net   -599 ml   Filed Weights   07/11/14 0130  Weight: 216 lb 6.4 oz (98.158 kg)    PHYSICAL EXAM  General: Pleasant, NAD. obese Neuro: Alert and oriented X 3. Moves all extremities spontaneously. Psych: Normal affect. HEENT:  Normal  Neck: Supple without bruits or JVD. Lungs:  Resp regular and unlabored, CTA. Heart: RRR no s3, s4, or murmurs. Abdomen: Soft, non-tender, non-distended, BS + x 4.  Extremities: No clubbing, cyanosis or edema.  DP/PT/Radials 2+ and equal bilaterally.  Accessory Clinical Findings  CBC  Recent Labs  07/10/14 1800 07/11/14 0530  WBC 6.2 5.9  NEUTROABS 3.5  --   HGB 12.9 11.3*  HCT 38.4 34.8*  MCV 78.2 78.4  PLT 239 216   Basic Metabolic Panel  Recent Labs  07/10/14 1800 07/11/14 0530  NA 141 141  K 3.7 4.2  CL 105 105  CO2 23 25  GLUCOSE 124* 109*  BUN 13 12  CREATININE 0.87 0.91  CALCIUM 9.5 8.9   Liver Function Tests  Recent Labs  07/10/14 1800 07/11/14 0530  AST 15 13  ALT 14 11  ALKPHOS 89 74  BILITOT 0.3 0.2*  PROT 7.5 6.3  ALBUMIN 3.6 3.0*   No results for input(s): LIPASE, AMYLASE in the last 72 hours. Cardiac Enzymes  Recent Labs  07/11/14 0530 07/11/14 1120 07/11/14 1556  TROPONINI <0.30 <0.30 <0.30     TELE  NSR  Radiology/Studies  Dg Chest 2 View  07/10/2014   CLINICAL DATA:  Hypertension and chest pain  EXAM: CHEST  2 VIEW  COMPARISON:  May 11, 2012  FINDINGS: There is no edema or consolidation. Heart  is mildly enlarged with pulmonary vascularity within normal limits. No adenopathy. No bone lesions.  IMPRESSION: Heart mildly enlarged.  No edema or consolidation.   Electronically Signed   By: Bretta Bang M.D.   On: 07/10/2014 20:15    ASSESSMENT AND PLAN  80 year old female with past medical history of CAD s/p prior stenting, hypertension, dyslipidemia, obesity, obstructive sleep apnea who presented to [facility] ED 07/10/2014 with sudden onset chest pressure and heaviness with associated shortness of breath, nausea, vomiting and nearly passing out.   Chest pain concerning for [place]  Troponins negative. EKG unchanged.   Continue aspirin, Plavix, statin   2D ECHO yesterday w/ EF 70-75%, mod conc hypertrophy, no WMAs, G1DD, dynamic obstruction at restin the mid cavity, with a peakvelocity of 200 cm/sec and a peak gradient of 16 mm Hg. No LVoutflow obstruction is seen. Mild AR, mild LA dilation and trivial pericardial effusion.  Her  symptoms are identical to those of her prior [place] when she required stenting. Concerning for [place]. On cath board today at 12 noon    Essential hypertension  Resume Norvasc.   Hyperlipidemia with target LDL less than 70  Resume statin therapy  Signed, Janetta Hora PA-C  Pager [phone]

## 2014-07-13 DIAGNOSIS — I10 Essential (primary) hypertension: Secondary | ICD-10-CM | POA: Diagnosis not present

## 2014-07-13 DIAGNOSIS — R079 Chest pain, unspecified: Secondary | ICD-10-CM | POA: Insufficient documentation

## 2014-07-13 DIAGNOSIS — E669 Obesity, unspecified: Secondary | ICD-10-CM | POA: Diagnosis not present

## 2014-07-13 DIAGNOSIS — I25110 Atherosclerotic heart disease of native coronary artery with unstable angina pectoris: Secondary | ICD-10-CM | POA: Diagnosis not present

## 2014-07-13 DIAGNOSIS — I2510 Atherosclerotic heart disease of native coronary artery without angina pectoris: Secondary | ICD-10-CM | POA: Diagnosis not present

## 2014-07-13 DIAGNOSIS — R0789 Other chest pain: Secondary | ICD-10-CM | POA: Diagnosis not present

## 2014-07-13 DIAGNOSIS — E785 Hyperlipidemia, unspecified: Secondary | ICD-10-CM | POA: Diagnosis not present

## 2014-07-13 LAB — GLUCOSE, CAPILLARY: Glucose-Capillary: 163 mg/dL — ABNORMAL HIGH (ref 70–99)

## 2014-07-13 MED ORDER — CARVEDILOL 12.5 MG PO TABS: 12.5000 mg | ORAL_TABLET | Freq: Two times a day (BID) | ORAL | Status: DC

## 2014-07-13 MED ORDER — CARVEDILOL 12.5 MG PO TABS
12.5000 mg | ORAL_TABLET | Freq: Two times a day (BID) | ORAL | Status: DC
  Administered 2014-07-13: 12.5 mg via ORAL
  Filled 2014-07-13: qty 1

## 2014-07-13 NOTE — Progress Notes (Signed)
Patient Name: [name] Date of Encounter: 07/13/2014     Principal Problem:   Unstable angina Active Problems:   CAD (coronary artery disease)   Essential hypertension   Obesity (BMI 30-39.9)   Hyperlipidemia with target LDL less than 70   Obstructive sleep apnea   Coronary artery disease involving native coronary artery of native heart without angina pectoris    SUBJECTIVE  NO CP. Ready to go home.    CURRENT MEDS . amLODipine  10 mg Oral Daily  . angioplasty book   Does not apply Once  . aspirin EC  81 mg Oral Daily  . cholecalciferol  2,000 Units Oral Daily  . clopidogrel  75 mg Oral Daily  . enoxaparin (LOVENOX) injection  40 mg Subcutaneous Daily  . iron polysaccharides  150 mg Oral Daily  . pantoprazole  40 mg Oral Daily  . rosuvastatin  20 mg Oral Daily  . sodium chloride  3 mL Intravenous Q12H  . sodium chloride  3 mL Intravenous Q12H  . sodium chloride  3 mL Intravenous Q12H    OBJECTIVE  Filed Vitals:   07/12/14 1730 07/12/14 1800 07/12/14 2029 07/13/14 0500  BP: 178/94 184/77 150/75 158/66  Pulse:   92 77  Temp:   98.6 F (37 C) 98.3 F (36.8 C)  TempSrc:   Oral Oral  Resp:   18 18  Height:      Weight:    211 lb 8 oz (95.936 kg)  SpO2:   98% 99%    Intake/Output Summary (Last 24 hours) at 07/13/14 0814 Last data filed at 07/12/14 1800  Gross per 24 hour  Intake    300 ml  Output    300 ml  Net      0 ml   Filed Weights   07/11/14 0130 07/13/14 0500  Weight: 216 lb 6.4 oz (98.158 kg) 211 lb 8 oz (95.936 kg)    PHYSICAL EXAM  General: Pleasant, NAD. obese Neuro: Alert and oriented X 3. Moves all extremities spontaneously. Psych: Normal affect. HEENT:  Normal  Neck: Supple without bruits or JVD. Lungs:  Resp regular and unlabored, CTA. Heart: RRR no s3, s4, or murmurs. Abdomen: Soft, non-tender, non-distended, BS + x 4.  Extremities: No clubbing, cyanosis or edema. DP/PT/Radials 2+ and equal bilaterally.  Accessory Clinical  Findings  CBC  Recent Labs  07/10/14 1800 07/11/14 0530  WBC 6.2 5.9  NEUTROABS 3.5  --   HGB 12.9 11.3*  HCT 38.4 34.8*  MCV 78.2 78.4  PLT 239 216   Basic Metabolic Panel  Recent Labs  07/10/14 1800 07/11/14 0530  NA 141 141  K 3.7 4.2  CL 105 105  CO2 23 25  GLUCOSE 124* 109*  BUN 13 12  CREATININE 0.87 0.91  CALCIUM 9.5 8.9   Liver Function Tests  Recent Labs  07/10/14 1800 07/11/14 0530  AST 15 13  ALT 14 11  ALKPHOS 89 74  BILITOT 0.3 0.2*  PROT 7.5 6.3  ALBUMIN 3.6 3.0*    Cardiac Enzymes  Recent Labs  07/11/14 0530 07/11/14 1120 07/11/14 1556  TROPONINI <0.30 <0.30 <0.30     TELE  NSR with 5 beat run of NSVT  Radiology/Studies  Dg Chest 2 View  07/10/2014   CLINICAL DATA:  Hypertension and chest pain  EXAM: CHEST  2 VIEW  COMPARISON:  May 11, 2012  FINDINGS: There is no edema or consolidation. Heart is mildly enlarged with pulmonary vascularity within  normal limits. No adenopathy. No bone lesions.  IMPRESSION: Heart mildly enlarged.  No edema or consolidation.   Electronically Signed   By: Bretta Bang M.D.   On: 07/10/2014 20:15       Cardiac Catheterization Procedure Note  Name: [name] MRN: [id number] DOB: 17-Feb-1934 Procedure: Left Heart Cath, Selective Coronary Angiography Indication: Chest pain concerning for USAP  Procedural Details: The right wrist was prepped, draped, and anesthetized with 1% lidocaine. Using the modified Seldinger technique, a 5/6 French Slender sheath was introduced into the right radial artery. 3 mg of verapamil was administered through the sheath, weight-based unfractionated heparin was administered intravenously. Standard Judkins catheters were used for selective coronary angiography. Catheter exchanges were performed over an exchange length guidewire. There were no immediate procedural complications. A TR band was used for radial hemostasis at the  completion of the procedure. The patient was transferred to the post catheterization recovery area for further monitoring. Procedural Findings: Hemodynamics: AO 183/75 LV 181/75 Coronary angiography: Coronary dominance: right Left mainstem: Widely patent with nonobstructive irregularity and mild calcification Left anterior descending (LAD): Prox vessel is widely patent. There is a large first diagonal without stenosis. The mid-vessel beyond the diagonal has an intramyocardial bridge. There is no obstructive disease in the LAD distribution. Left circumflex (LCx): The ramus branch is patent with a widely patent stent. The circumflex is a dominant vessel with patent OM branches and a stable 50% stenosis in the distal AV circumflex before the left PDA. No change compared with the 2007 study. Right coronary artery (RCA): Moderate caliber, nondominant vessel with a patent stent in the mid-vessel. Left ventriculography: deferred. LVEF 75% by echo Estimated Blood Loss: minimal Final Conclusions:  1. Double vessel CAD with patent stents in the ramus and RCA 2. Widely patent LAD 3. Normal LV function by echo Recommendations: continue medical therapy for CAD   ASSESSMENT AND PLAN  80 year old female with past medical history of CAD s/p prior stenting, hypertension, dyslipidemia, obesity, obstructive sleep apnea who presented to [facility] ED 07/10/2014 with sudden onset chest pressure and heaviness with associated shortness of breath, nausea, vomiting and nearly passing out.   Chest pain concerning for [place]  Troponins negative. EKG unchanged.   Continue aspirin, Plavix, statin. She was on carvedilol 12.5 mg BID at her last office visit this summer. This has somehow fallen off her list. Will add back for better BP control as well as treatment of her CAD  2D ECHO yesterday w/ EF 70-75%, mod conc hypertrophy, no WMAs, G1DD, with small outflow tract gradient. Mild AR, mild LA dilation and trivial  pericardial effusion.  She underwent LHC yesterday which revealed double vessel CAD with patent stents in the ramus and RCA and widely patent LAD Recommendations: continue medical therapy for CAD   Essential hypertension- BPs have been running high  Resume Norvasc. Will add back coreg 12.5mg  BIG (not clear as to why this is not listed on her home meds, it was listed on her med rec at office visit with Dr. Tresa Endo in 02/2014) She denies a hx of bradycardia or hypotension.   Hyperlipidemia with target LDL less than 70  Resume statin therapy  NSVT  5 beats noted on tele. Will add back her carvedilol 12.5 mg BID  Billy Fischer PA-C  Pager [phone]

## 2014-07-13 NOTE — Discharge Summary (Signed)
Physician Discharge Summary  Thelia Estill BambergM Sermons WUJ:[id number] DOB: 07/03/1934 DOA: 07/10/2014  PCP: Colette RibasGOLDING, JOHN [name], MD  Admit date: 07/10/2014 Discharge date: 07/13/2014  Time spent: 30 minutes  Recommendations for Outpatient Follow-up:  1. Follow up with PCP as needed.   Discharge Diagnoses:  Principal Problem:   Unstable angina Active Problems:   CAD (coronary artery disease)   Essential hypertension   Obesity (BMI 30-39.9)   Hyperlipidemia with target LDL less than 70   Obstructive sleep apnea   Coronary artery disease involving native coronary artery of native heart without angina pectoris   Chest pain   Discharge Condition: improved.  Diet recommendation: low sodium diet  Filed Weights   07/11/14 0130 07/13/14 0500  Weight: 98.158 kg (216 lb 6.4 oz) 95.936 kg (211 lb 8 oz)    History of present illness:  80 year old lady with h/o cad s/p PCI , hypertension, Dyslipidemia, OSA, presented with chest pressure and was admitted to the hospital for evaluation of ACS. Cardiology consulted and she underwent left heart cath, showed two vessel disease with patent stents in the ramus and RCA, with further recommendations to continue medical therapy and better control of blood pressure.   Hospital Course:  Chest pain; ACS ruled out, troponins negative. EKG unchanged. Cardiology consulted and underwent left heart cath, recommended medical management.   Hypertension; Better controlled now that we started on norvasc  Hyperlipidemia; Resume statins.    Procedures:  Left heart catheterization with coronary angiogram.   Consultations: cardiology Discharge Exam: Filed Vitals:   07/13/14 1355  BP: 144/67  Pulse: 71  Temp: 98.8 F (37.1 C)  Resp: 18    General: alert afebrile comfortable Cardiovascular: s1s2 Respiratory: ctab  Discharge Instructions You were cared for by a hospitalist during your hospital stay. If you have any questions about your discharge  medications or the care you received while you were in the hospital after you are discharged, you can call the unit and asked to speak with the hospitalist on call if the hospitalist that took care of you is not available. Once you are discharged, your primary care physician will handle any further medical issues. Please note that NO REFILLS for any discharge medications will be authorized once you are discharged, as it is imperative that you return to your primary care physician (or establish a relationship with a primary care physician if you do not have one) for your aftercare needs so that they can reassess your need for medications and monitor your lab values.  Discharge Instructions    Diet - low sodium heart healthy    Complete by:  As directed      Discharge instructions    Complete by:  As directed   Follow up with cardiology as recommended.          Current Discharge Medication List    START taking these medications   Details  carvedilol (COREG) 12.5 MG tablet Take 1 tablet (12.5 mg total) by mouth 2 (two) times daily with a meal. Qty: 60 tablet, Refills: 0      CONTINUE these medications which have NOT CHANGED   Details  amLODipine (NORVASC) 10 MG tablet Take 1 tablet (10 mg total) by mouth daily. Qty: 30 tablet, Refills: 6    aspirin 81 MG tablet Take 81 mg by mouth daily.    cholecalciferol (VITAMIN D) 1000 UNITS tablet Take 2,000 Units by mouth daily.    clopidogrel (PLAVIX) 75 MG tablet Take 1 tablet (75  mg total) by mouth daily. Qty: 30 tablet, Refills: 7    diazepam (VALIUM) 5 MG tablet Take 5 mg by mouth at bedtime as needed for anxiety.    esomeprazole (NEXIUM) 40 MG capsule Take 1 capsule (40 mg total) by mouth daily before breakfast. Qty: 30 capsule, Refills: 9    fluticasone (FLONASE) 50 MCG/ACT nasal spray Place 1 spray into the nose as needed.    iron polysaccharides (NIFEREX) 150 MG capsule Take 1 capsule (150 mg total) by mouth daily. Qty: 30 capsule,  Refills: 5    rosuvastatin (CRESTOR) 20 MG tablet Take 1 tablet (20 mg total) by mouth daily. Qty: 30 tablet, Refills: 6    nitroGLYCERIN (NITROSTAT) 0.4 MG SL tablet Place 0.4 mg under the tongue every 5 (five) minutes as needed.       Allergies  Allergen Reactions  . Codeine   . Contrast Media [Iodinated Diagnostic Agents]   . Other     There are 3 other allergies but pt was unable to verify what they were   Follow-up Information    Follow up with Colette RibasGOLDING, JOHN [name], MD. Schedule an appointment as soon as possible for a visit in 1 week.   Specialty:  Family Medicine   Contact information:   [address] [place] [address] [phone]        The results of significant diagnostics from this hospitalization (including imaging, microbiology, ancillary and laboratory) are listed below for reference.    Significant Diagnostic Studies: Dg Chest 2 View  07/10/2014   CLINICAL DATA:  Hypertension and chest pain  EXAM: CHEST  2 VIEW  COMPARISON:  May 11, 2012  FINDINGS: There is no edema or consolidation. Heart is mildly enlarged with pulmonary vascularity within normal limits. No adenopathy. No bone lesions.  IMPRESSION: Heart mildly enlarged.  No edema or consolidation.   Electronically Signed   By: Bretta BangWilliam  Woodruff M.D.   On: 07/10/2014 20:15    Microbiology: No results found for this or any previous visit (from the past 240 hour(s)).   Labs: Basic Metabolic Panel:  Recent Labs Lab 07/10/14 1800 07/11/14 0530  NA 141 141  K 3.7 4.2  CL 105 105  CO2 23 25  GLUCOSE 124* 109*  BUN 13 12  CREATININE 0.87 0.91  CALCIUM 9.5 8.9   Liver Function Tests:  Recent Labs Lab 07/10/14 1800 07/11/14 0530  AST 15 13  ALT 14 11  ALKPHOS 89 74  BILITOT 0.3 0.2*  PROT 7.5 6.3  ALBUMIN 3.6 3.0*   No results for input(s): LIPASE, AMYLASE in the last 168 hours. No results for input(s): AMMONIA in the last 168 hours. CBC:  Recent Labs Lab 07/10/14 1800  07/11/14 0530  WBC 6.2 5.9  NEUTROABS 3.5  --   HGB 12.9 11.3*  HCT 38.4 34.8*  MCV 78.2 78.4  PLT 239 216   Cardiac Enzymes:  Recent Labs Lab 07/10/14 1800 07/11/14 0530 07/11/14 1120 07/11/14 1556  TROPONINI <0.30 <0.30 <0.30 <0.30   BNP: BNP (last 3 results) No results for input(s): PROBNP in the last 8760 hours. CBG:  Recent Labs Lab 07/11/14 0819 07/12/14 0756 07/13/14 0747  GLUCAP 110* 110* 163*       Signed:  Sorina Derrig  [facility] 07/13/2014, 3:33 PM

## 2014-07-13 NOTE — Progress Notes (Signed)
Pt discharged home via W/C accompanied by daughter, condition stable.

## 2014-07-13 NOTE — Progress Notes (Signed)
UR completed 

## 2014-07-14 ENCOUNTER — Telehealth: Payer: Self-pay | Admitting: Cardiovascular Disease

## 2014-07-19 NOTE — Telephone Encounter (Signed)
Closed encounter °

## 2014-07-22 DIAGNOSIS — E6609 Other obesity due to excess calories: Secondary | ICD-10-CM | POA: Diagnosis not present

## 2014-07-22 DIAGNOSIS — R51 Headache: Secondary | ICD-10-CM | POA: Diagnosis not present

## 2014-07-22 DIAGNOSIS — Z6836 Body mass index (BMI) 36.0-36.9, adult: Secondary | ICD-10-CM | POA: Diagnosis not present

## 2014-07-29 ENCOUNTER — Encounter: Payer: Self-pay | Admitting: Cardiology

## 2014-07-29 ENCOUNTER — Ambulatory Visit (INDEPENDENT_AMBULATORY_CARE_PROVIDER_SITE_OTHER): Payer: Medicare Other | Admitting: Cardiology

## 2014-07-29 VITALS — BP 135/70 | HR 71 | Ht 64.0 in | Wt 216.0 lb

## 2014-07-29 DIAGNOSIS — I2510 Atherosclerotic heart disease of native coronary artery without angina pectoris: Secondary | ICD-10-CM | POA: Diagnosis not present

## 2014-07-29 DIAGNOSIS — I10 Essential (primary) hypertension: Secondary | ICD-10-CM | POA: Diagnosis not present

## 2014-07-29 NOTE — Patient Instructions (Signed)
Your physician recommends that you schedule a follow-up appointment in: Keep appointment with Dr Tresa EndoKelly on 08/10/2014

## 2014-07-29 NOTE — Progress Notes (Signed)
07/29/2014 [name]   11/24/1933  [id number]  Primary Physician Colette RibasGOLDING, JOHN [name], MD Primary Cardiologist: Dr. Tresa EndoKelly  HPI:  [name] presents to clinic today for post hospital follow-up. She is a 80 year old female, followed by Dr. Tresa EndoKelly, with a h/o CAD s/p PCI , hypertension, Dyslipidemia and OSA, who [facility] with chest pressure and was admitted to the hospital for evaluation of ACS. Cardiac enzymes were cycled and were negative 3, however her symptoms were concerning for unstable angina. Cardiology was consulted and she underwent a left heart cath, which showed two vessel disease with patent stents in the ramus and RCA. EF was estimated at 75%. Recommendations were to continue medical therapy and better control of blood pressure. She was placed on Coreg, 12.5 mg twice a day. She was continued on aspirin, Plavix, Crestor and amlodipine.  She presents to clinic today for post hospital follow-up. She is accompanied by her daughter. She states that she has been doing fairly well since discharge from the hospital. She denies any recurrent chest pain. She feels that her chest discomfort was likely related to her hypertension. Since being on the Coreg, she has noted significant improvement in her blood pressure. She reports full medication compliance. Her only complaint today is increased fatigue since leaving the hospital. She denies any signs of abnormal bleeding including melena or hematochezia. She also denies dyspnea, orthopnea, PND or lower extremity edema.   Current Outpatient Prescriptions  Medication Sig Dispense Refill  . amLODipine (NORVASC) 10 MG tablet Take 1 tablet (10 mg total) by mouth daily. 30 tablet 6  . aspirin 81 MG tablet Take 81 mg by mouth daily.    . carvedilol (COREG) 12.5 MG tablet Take 1 tablet (12.5 mg total) by mouth 2 (two) times daily with a meal. 60 tablet 0  . cholecalciferol (VITAMIN D) 1000 UNITS tablet Take 2,000 Units by mouth  daily.    . clopidogrel (PLAVIX) 75 MG tablet Take 1 tablet (75 mg total) by mouth daily. 30 tablet 7  . diazepam (VALIUM) 5 MG tablet Take 5 mg by mouth at bedtime as needed for anxiety.    Marland Kitchen. esomeprazole (NEXIUM) 40 MG capsule Take 1 capsule (40 mg total) by mouth daily before breakfast. 30 capsule 9  . fluticasone (FLONASE) 50 MCG/ACT nasal spray Place 1 spray into the nose as needed.    . iron polysaccharides (NIFEREX) 150 MG capsule Take 1 capsule (150 mg total) by mouth daily. 30 capsule 5  . nitroGLYCERIN (NITROSTAT) 0.4 MG SL tablet Place 0.4 mg under the tongue every 5 (five) minutes as needed.    . rosuvastatin (CRESTOR) 20 MG tablet Take 1 tablet (20 mg total) by mouth daily. 30 tablet 6   No current facility-administered medications for this visit.    Allergies  Allergen Reactions  . Codeine   . Contrast Media [Iodinated Diagnostic Agents]   . Other     There are 3 other allergies but pt was unable to verify what they were    History   Social History  . Marital Status: Single    Spouse Name: N/A    Number of Children: N/A  . Years of Education: N/A   Occupational History  . Not on file.   Social History Main Topics  . Smoking status: Never Smoker   . Smokeless tobacco: Never Used  . Alcohol Use: No  . Drug Use: No  . Sexual Activity: Not on file  Other Topics Concern  . Not on file   Social History Narrative     Review of Systems: General: negative for chills, fever, night sweats or weight changes.  Cardiovascular: negative for chest pain, dyspnea on exertion, edema, orthopnea, palpitations, paroxysmal nocturnal dyspnea or shortness of breath Dermatological: negative for rash Respiratory: negative for cough or wheezing Urologic: negative for hematuria Abdominal: negative for nausea, vomiting, diarrhea, bright red blood per rectum, melena, or hematemesis Neurologic: negative for visual changes, syncope, or dizziness All other systems reviewed and are  otherwise negative except as noted above.    Blood pressure 135/70, pulse 71, height 5\' 4"  (1.626 m), weight 216 lb (97.977 kg).  General appearance: alert, cooperative and no distress Neck: no carotid bruit and no JVD Lungs: clear to auscultation bilaterally Heart: regular rate and rhythm, S1, S2 normal, no murmur, click, rub or gallop Extremities: no LEE Pulses: 2+ and symmetric Skin: warm and dry Neurologic: Grossly normal  EKG Not performed  ASSESSMENT AND PLAN:   1. Chest pain: Recent left heart catheterization revealed patent stents in the ramus and RCA. No significant lesions were identified. Her chest pain was likely subsequent to hypertension, which is now well-controlled with the addition of Coreg. She denies any recurrent chest pain symptoms. Continue medical therapy.  2. CAD: Left heart catheterization with patent coronary stents at the ramus and RCA. Continue medical therapy including dual antiplatelet therapy with aspirin and Plavix, statin therapy with Crestor as well as beta blocker and ACE inhibitor.  3. Hypertension: Improved and better control with addition of Coreg. Continue ACE inhibitor and amlodipine.  4. Dyslipidemia: Continue statin therapy with Crestor.  5. Post cardiac catheterization: Right radial access site is stable without complication. She has a 2+ radial pulse on physical exam.  6. Fatigue: She notes increased fatigue since discharge from the hospital. She denies melena/hematochezia. It is likely that her symptoms are related to initiation of her beta blocker. Patient has a follow-up appointment with Dr. Tresa EndoKelly in 2 weeks. Her symptoms will be reassessed at this time.   PLAN  patient appears to be doing well from a cardiovascular standpoint. Blood pressure is well-controlled. She was instructed to keep her appointment scheduled with Dr. Tresa EndoKelly in 2 weeks for reevaluation  [name], [facility] CenterBRITTAINYPA-C 07/29/2014 11:19 AM

## 2014-08-10 ENCOUNTER — Encounter: Payer: Self-pay | Admitting: Cardiovascular Disease

## 2014-08-10 ENCOUNTER — Ambulatory Visit (INDEPENDENT_AMBULATORY_CARE_PROVIDER_SITE_OTHER): Payer: Medicare Other | Admitting: Cardiovascular Disease

## 2014-08-10 VITALS — BP 160/88 | HR 65 | Ht 65.0 in | Wt 218.0 lb

## 2014-08-10 DIAGNOSIS — E669 Obesity, unspecified: Secondary | ICD-10-CM

## 2014-08-10 DIAGNOSIS — Z79899 Other long term (current) drug therapy: Secondary | ICD-10-CM | POA: Diagnosis not present

## 2014-08-10 DIAGNOSIS — G4733 Obstructive sleep apnea (adult) (pediatric): Secondary | ICD-10-CM | POA: Diagnosis not present

## 2014-08-10 DIAGNOSIS — E782 Mixed hyperlipidemia: Secondary | ICD-10-CM | POA: Diagnosis not present

## 2014-08-10 DIAGNOSIS — I2510 Atherosclerotic heart disease of native coronary artery without angina pectoris: Secondary | ICD-10-CM | POA: Diagnosis not present

## 2014-08-10 DIAGNOSIS — E785 Hyperlipidemia, unspecified: Secondary | ICD-10-CM | POA: Diagnosis not present

## 2014-08-10 DIAGNOSIS — I10 Essential (primary) hypertension: Secondary | ICD-10-CM | POA: Diagnosis not present

## 2014-08-10 DIAGNOSIS — I2583 Coronary atherosclerosis due to lipid rich plaque: Secondary | ICD-10-CM

## 2014-08-10 MED ORDER — LOSARTAN POTASSIUM 50 MG PO TABS: 50.0000 mg | ORAL_TABLET | Freq: Every day | ORAL | Status: DC

## 2014-08-10 MED ORDER — CARVEDILOL 12.5 MG PO TABS: ORAL_TABLET | ORAL | Status: DC

## 2014-08-10 NOTE — Progress Notes (Signed)
Patient ID: [name], female   DOB: 02/16/1934, 80 y.o.   MRN: [id number]     HPI: Chosen [name] [name] is a 80 y.o. female who presents to the office today for a 6 month follow up cardiology evaluation.  Ms [name] has a history of known CAD, hypertension, obesity, hyperlipidemia, as well as untreated sleep apnea, for which he has refused CPAP therapy.  She suffered an acute coronary syndrome and underwent PCI to a totally occluded ramus intermediate vessel in 2007.  She had concomitant CAD with 60% distal circumflex stenosis and mid LAD myocardial muscle bridging.  Remotely, she had undergone stenting to RCA.  Her stress test in December 2012 showed a mild to moderate defect in the basal anterolateral region without significant ischemia with breast attenuation.   Over the past year, she has continued to do fairly well.  She has lost weight from 242 pounds to 209 pounds.  She has been maintained on amlodipine 7.5 mg, carvedilol 12.5 mg in the morning and 12.5 mg at night for blood pressure control.  She has tolerated Crestor 20 mg for hyperlipidemia.  She is on aspirin and Plavix for dual antiplatelet therapy and denies bleeding episodes.  She has had some difficulty with sinus congestion and allergies.  She denies any exertional chest pain.  She denies palpitations.  She denies PND or orthopnea.  A followup nuclear perfusion study on 01/20/2014 remained  low risk and showed mild to moderate basal, mid, anterolateral scar without reversible ischemia, unchanged from her prior study.  When I last saw her in June 2015, I titrated her amlodipine.  For more optimal blood pressure control.  She recently was admitted to the hospital on 07/10/2014 with chest pressure.  Cardiac enzymes were cycled and were negative 3.  However, due to concerning symptoms for unstable angina.  She subsequently underwent a left heart catheterization with showed patent stents in the ramus and RCA.  Ejection fraction was 75%.  She was  started on carvedilol 12.5 mg twice a day and continued on aspirin, Plavix, Crestor 20 mg, and amlodipine 10 mg.  Recently, she denies recurrent chest pain.  She admits to significant pain in her knees bilaterally due to arthritic bone-on-bone condition.  She denies presyncope or syncope.  She presents for evaluation.  Past Medical History  Diagnosis Date  . Coronary artery disease 11/2009    2D Echo EF>55  . Hypertension   . High cholesterol   . Reflux esophagitis   . Obesity   . Arthritis   . Sleep apnea 09/27/2010    Untreated, REM 64.7/hr AHI 18.5/hr RDI 19.2/hr. Patuent refused CPAP therapy    Past Surgical History  Procedure Laterality Date  . Cholecystectomy    . Coronary angioplasty with stent placement  2007    A 3.0x7318mm CYPHER stent post dilated to 3.29 mm the 100% occlusion was reduced to 0%    Allergies  Allergen Reactions  . Codeine   . Contrast Media [Iodinated Diagnostic Agents]   . Other     There are 3 other allergies but pt was unable to verify what they were    Current Outpatient Prescriptions  Medication Sig Dispense Refill  . amLODipine (NORVASC) 10 MG tablet Take 1 tablet (10 mg total) by mouth daily. 30 tablet 6  . aspirin 81 MG tablet Take 81 mg by mouth daily.    . carvedilol (COREG) 12.5 MG tablet Take 1 tablet (12.5 mg total) by mouth 2 (two) times daily  with a meal. 60 tablet 0  . cholecalciferol (VITAMIN D) 1000 UNITS tablet Take 2,000 Units by mouth daily.    . clopidogrel (PLAVIX) 75 MG tablet Take 1 tablet (75 mg total) by mouth daily. 30 tablet 7  . diazepam (VALIUM) 5 MG tablet Take 5 mg by mouth at bedtime as needed for anxiety.    Marland Kitchen esomeprazole (NEXIUM) 40 MG capsule Take 1 capsule (40 mg total) by mouth daily before breakfast. 30 capsule 9  . fluticasone (FLONASE) 50 MCG/ACT nasal spray Place 1 spray into the nose as needed.    . iron polysaccharides (NIFEREX) 150 MG capsule Take 1 capsule (150 mg total) by mouth daily. 30 capsule 5  .  nitroGLYCERIN (NITROSTAT) 0.4 MG SL tablet Place 0.4 mg under the tongue every 5 (five) minutes as needed.    . rosuvastatin (CRESTOR) 20 MG tablet Take 1 tablet (20 mg total) by mouth daily. 30 tablet 6   No current facility-administered medications for this visit.    History   Social History  . Marital Status: Single    Spouse Name: N/A    Number of Children: N/A  . Years of Education: N/A   Occupational History  . Not on file.   Social History Main Topics  . Smoking status: Never Smoker   . Smokeless tobacco: Never Used  . Alcohol Use: No  . Drug Use: No  . Sexual Activity: Not on file   Other Topics Concern  . Not on file   Social History Narrative   Socially, she has 9 children, and 7 grandchildren, and 2 great-grandchildren.  There is no tobacco or alcohol use.  History reviewed. No pertinent family history.  ROS General: Negative; No fevers, chills, or night sweats HEENT: Negative; No changes in vision or hearing, sinus congestion, difficulty swallowing Pulmonary: Negative; No cough, wheezing, shortness of breath, hemoptysis Cardiovascular: See HPI: No chest pain, presyncope, syncope, palpatations GI: Negative; No nausea, vomiting, diarrhea, or abdominal pain GU: Negative; No dysuria, hematuria, or difficulty voiding Musculoskeletal: Positive for bilateral knee discomfort Hematologic: History of anemia no easy bruising, bleeding Endocrine: Negative; no heat/cold intolerance; no diabetes, Neuro: Negative; no changes in balance, headaches Skin: Negative; No rashes or skin lesions Psychiatric: Negative; No behavioral problems, depression Sleep: Positive for sleep apnea, no hypersomnolence, bruxism, restless legs, hypnogognic hallucinations. Other comprehensive 14 point system review is negative   Physical Exam BP 160/88 mmHg  Pulse 65  Ht 5\' 5"  (1.651 m)  Wt 218 lb (98.884 kg)  BMI 36.28 kg/m2 General: Alert, oriented, no distress.  Skin: normal turgor, no  rashes, warm and dry HEENT: Normocephalic, atraumatic. Pupils equal round and reactive to light; sclera anicteric; extraocular muscles intact, No lid lag; Nose without nasal septal hypertrophy; Mouth/Parynx benign; Mallinpatti scale 3/4 Neck: No JVD, no carotid bruits; normal carotid upstroke Lungs: clear to ausculatation and percussion bilaterally; no wheezing or rales, normal inspiratory and expiratory effort Chest wall: without tenderness to palpitation Heart: PMI not displaced, RRR, s1 s2 normal, 1/6 systolic murmur, No diastolic murmur, no rubs, gallops, thrills, or heaves Abdomen: Central adiposity; soft, nontender; no hepatosplenomehaly, BS+; abdominal aorta nontender and not dilated by palpation. Back: no CVA tenderness Pulses: 2+  Musculoskeletal: full range of motion, normal strength, no joint deformities Extremities: Pulses 2+, no clubbing cyanosis or edema, Homan's sign negative  Neurologic: grossly nonfocal; Cranial nerves grossly wnl Psychologic: Normal mood and affect  ECG (independently read by me): Normal sinus rhythm at 65 bpm.  Right  bundle-branch block with repolarization changes.  PR interval 182 ms.  QTc interval 484 ms.   LABS:  BMET    Component Value Date/Time   NA 141 07/11/2014 0530   K 4.2 07/11/2014 0530   CL 105 07/11/2014 0530   CO2 25 07/11/2014 0530   GLUCOSE 109* 07/11/2014 0530   BUN 12 07/11/2014 0530   CREATININE 0.91 07/11/2014 0530   CREATININE 0.96 03/02/2014 1057   CALCIUM 8.9 07/11/2014 0530   GFRNONAA 59* 07/11/2014 0530   GFRAA 68* 07/11/2014 0530     Hepatic Function Panel     Component Value Date/Time   PROT 6.3 07/11/2014 0530   ALBUMIN 3.0* 07/11/2014 0530   AST 13 07/11/2014 0530   ALT 11 07/11/2014 0530   ALKPHOS 74 07/11/2014 0530   BILITOT 0.2* 07/11/2014 0530     CBC    Component Value Date/Time   WBC 5.9 07/11/2014 0530   RBC 4.44 07/11/2014 0530   HGB 11.3* 07/11/2014 0530   HCT 34.8* 07/11/2014 0530   PLT  216 07/11/2014 0530   MCV 78.4 07/11/2014 0530   MCH 25.5* 07/11/2014 0530   MCHC 32.5 07/11/2014 0530   RDW 16.3* 07/11/2014 0530   LYMPHSABS 2.2 07/10/2014 1800   MONOABS 0.4 07/10/2014 1800   EOSABS 0.0 07/10/2014 1800   BASOSABS 0.0 07/10/2014 1800     BNP    Component Value Date/Time   PROBNP 67.9 12/01/2011 1035    Lipid Panel     Component Value Date/Time   CHOL 171 03/02/2014 1057   TRIG 87 03/02/2014 1057   HDL 56 03/02/2014 1057   CHOLHDL 3.1 03/02/2014 1057   VLDL 17 03/02/2014 1057   LDLCALC 98 03/02/2014 1057     RADIOLOGY: No results found.    ASSESSMENT AND PLAN: Ms. Richardo Priest is a 80 year old female who has established coronary artery disease and suffered an acute coronary syndrome in 2007 and underwent PCI to a totally occluded ramus intermediate vessel.  Remotely, she had undergone prior stenting to her RCA and had been demonstrated to have concomitant CAD.  I have reviewed her most recent hospitalization.  Her most recent cardiac catheterization was not done by me but this revealed patent LAD and RCA stents.  The circumflex had stable 50% stenosis in the distal AV groove before the left PDA which was unchanged from 2007.  She also was noted to have an intramyocardial bridge in the mid LAD.  She's not had recurrent chest pain symptoms.  Her blood pressure today is still elevated at 160/88.  I have recommended further titration of her carvedilol to 18.75 mg twice a day.  I am also adding losartan 50 mg daily to her medical regimen.  I amrecommending follow-up laboratory be obtained in one week to make certain she is tolerating his losartan addition If at that time her LDL cholesterol is still not at goal  we will further titrate her Crestor to achieve an LDL less than 70.   Lennette Bihari, MD, [facility]  08/10/2014 11:15 AM

## 2014-08-10 NOTE — Patient Instructions (Signed)
Your physician has recommended you make the following change in your medication: the carvedilol has been increased to 1 & 1/2 tablet twice daily. A new prescription for losartan has been started. both of these prescriptions has been sent to your pharmacy.  Your physician recommends that you return for lab work in: 1 week ( fasting).  Your physician recommends that you schedule a follow-up appointment in: 2-3 months.

## 2014-08-12 DIAGNOSIS — M170 Bilateral primary osteoarthritis of knee: Secondary | ICD-10-CM | POA: Diagnosis not present

## 2014-08-16 ENCOUNTER — Other Ambulatory Visit (HOSPITAL_COMMUNITY): Payer: Self-pay | Admitting: Physician Assistant

## 2014-08-16 DIAGNOSIS — Z1231 Encounter for screening mammogram for malignant neoplasm of breast: Secondary | ICD-10-CM

## 2014-08-19 ENCOUNTER — Encounter (HOSPITAL_COMMUNITY): Payer: Self-pay | Admitting: Cardiovascular Disease

## 2014-08-19 DIAGNOSIS — Z6835 Body mass index (BMI) 35.0-35.9, adult: Secondary | ICD-10-CM | POA: Diagnosis not present

## 2014-08-19 DIAGNOSIS — I10 Essential (primary) hypertension: Secondary | ICD-10-CM | POA: Diagnosis not present

## 2014-08-25 ENCOUNTER — Ambulatory Visit (HOSPITAL_COMMUNITY)
Admission: RE | Admit: 2014-08-25 | Discharge: 2014-08-25 | Disposition: A | Discharge status: Home / Self Care | Payer: Medicare Other | Source: Ambulatory Visit | Attending: Physician Assistant | Admitting: Physician Assistant

## 2014-08-25 DIAGNOSIS — Z1231 Encounter for screening mammogram for malignant neoplasm of breast: Secondary | ICD-10-CM | POA: Diagnosis not present

## 2014-09-06 ENCOUNTER — Other Ambulatory Visit: Payer: Self-pay

## 2014-09-06 MED ORDER — CLOPIDOGREL BISULFATE 75 MG PO TABS: 75.0000 mg | ORAL_TABLET | Freq: Every day | ORAL | Status: DC

## 2014-09-06 NOTE — Telephone Encounter (Signed)
Rx sent to pharmacy   

## 2014-09-08 ENCOUNTER — Other Ambulatory Visit: Payer: Self-pay | Admitting: *Deleted

## 2014-09-08 MED ORDER — CLOPIDOGREL BISULFATE 75 MG PO TABS: 75.0000 mg | ORAL_TABLET | Freq: Every day | ORAL | Status: DC

## 2014-10-05 ENCOUNTER — Telehealth: Payer: Self-pay | Admitting: Cardiovascular Disease

## 2014-10-05 NOTE — Telephone Encounter (Signed)
1.26.16 Received FMLA ([employer] ) paperwork from patient and daughter. Completed Release, [employer] FMLA forms and Bank check sent to [facility] @ [place] on 10/05/14. lp

## 2014-10-13 NOTE — Telephone Encounter (Signed)
2.3.16 Received [employer] form back from [employer] @ [place].  Given to Henrene PastorW Waddell for Dr Tresa EndoKelly to review and sign.  lp

## 2014-10-19 NOTE — Telephone Encounter (Signed)
Forms are still on my cart awaiting Dr. Landry DykeKelly's completion and signature.

## 2014-10-22 ENCOUNTER — Telehealth: Payer: Self-pay | Admitting: Cardiovascular Disease

## 2014-10-22 NOTE — Telephone Encounter (Signed)
Received signed completed FMLA [employer] paperwork from Dr Tresa Endo.  Faxed to [facility].  Notified patient that paperwork was faxed 10/21/14. lp

## 2014-10-29 DIAGNOSIS — K529 Noninfective gastroenteritis and colitis, unspecified: Secondary | ICD-10-CM | POA: Diagnosis not present

## 2014-10-29 DIAGNOSIS — Z6835 Body mass index (BMI) 35.0-35.9, adult: Secondary | ICD-10-CM | POA: Diagnosis not present

## 2014-11-03 ENCOUNTER — Other Ambulatory Visit: Payer: Self-pay | Admitting: Cardiovascular Disease

## 2014-11-03 NOTE — Telephone Encounter (Signed)
Rx(s) sent to pharmacy electronically.  

## 2014-11-15 ENCOUNTER — Ambulatory Visit (INDEPENDENT_AMBULATORY_CARE_PROVIDER_SITE_OTHER): Payer: Medicare Other | Admitting: Cardiovascular Disease

## 2014-11-15 ENCOUNTER — Encounter: Payer: Self-pay | Admitting: Cardiovascular Disease

## 2014-11-15 VITALS — BP 148/84 | HR 66 | Ht 65.0 in | Wt 217.0 lb

## 2014-11-15 DIAGNOSIS — G4733 Obstructive sleep apnea (adult) (pediatric): Secondary | ICD-10-CM | POA: Diagnosis not present

## 2014-11-15 DIAGNOSIS — I2583 Coronary atherosclerosis due to lipid rich plaque: Principal | ICD-10-CM

## 2014-11-15 DIAGNOSIS — E785 Hyperlipidemia, unspecified: Secondary | ICD-10-CM | POA: Diagnosis not present

## 2014-11-15 DIAGNOSIS — I200 Unstable angina: Secondary | ICD-10-CM

## 2014-11-15 DIAGNOSIS — I2510 Atherosclerotic heart disease of native coronary artery without angina pectoris: Secondary | ICD-10-CM

## 2014-11-15 DIAGNOSIS — E669 Obesity, unspecified: Secondary | ICD-10-CM | POA: Diagnosis not present

## 2014-11-15 DIAGNOSIS — I10 Essential (primary) hypertension: Secondary | ICD-10-CM | POA: Diagnosis not present

## 2014-11-15 NOTE — Patient Instructions (Signed)
Your physician wants you to follow-up in: 6 Months You will receive a reminder letter in the mail two months in advance. If you don't receive a letter, please call our office to schedule the follow-up appointment.  

## 2014-11-15 NOTE — Progress Notes (Signed)
Patient ID: [name], female   DOB: [birth year], 81 y.o.   MRN: [id number]     HPI: [name] is a 81 y.o. female who presents to the office today for a 6 month follow up cardiology evaluation.  [name] has a history of known CAD, hypertension, obesity, hyperlipidemia, as well as untreated sleep apnea, for which he has refused CPAP therapy.  [name] suffered an acute coronary syndrome and underwent PCI to a totally occluded ramus intermediate vessel in 2007.  [name] had concomitant CAD with 60% distal circumflex stenosis and mid LAD myocardial muscle bridging.  Remotely, [name] had undergone stenting to RCA. A stress test in December 2012 showed a mild to moderate defect in the basal anterolateral region without significant ischemia with breast attenuation.   Over the past year, [name] has continued to do fairly well.  [name] has lost weight from 242 pounds to 209 pounds.  [name] has been maintained on amlodipine 7.5 mg, carvedilol 12.5 mg in the morning and 12.5 mg at night for blood pressure control.  [name] has tolerated Crestor 20 mg for hyperlipidemia.  [name] is on aspirin and Plavix for dual antiplatelet therapy and denies bleeding episodes.  [name] has had some difficulty with sinus congestion and allergies.  [name] denies any exertional chest pain.  [name] denies palpitations.  [name] denies PND or orthopnea.  A followup nuclear perfusion study on 01/20/2014 remained  low risk and showed mild to moderate basal, mid, anterolateral scar without reversible ischemia, unchanged from her prior study.  In June 2015, I titrated her amlodipine for more optimal blood pressure control.  On 07/10/2014 , [name] was admitted to the hospital with chest pressure.  Cardiac enzymes were cycled and were negative 3.  [name] underwent cardiac catheterization which with showed patent stents in the ramus and RCA.  Ejection fraction was 75%.  [name] was started on carvedilol 12.5 mg twice a day and continued on aspirin, Plavix, Crestor 20 mg, and  amlodipine 10 mg.  Since I last saw her in December 2015 [name] denies recurrent chest pain.  [name] admits to significant pain in her knees bilaterally due to arthritic bone-on-bone condition.  [name] has not lost any additional weight.  [name] denies leg swelling.  [name] is not very active.  [name] does get short of breath with activity.  [name] denies presyncope or syncope.  [name] presents for evaluation.  Past Medical History  Diagnosis Date  . Coronary artery disease 11/2009    2D Echo EF>55  . Hypertension   . High cholesterol   . Reflux esophagitis   . Obesity   . Arthritis   . Sleep apnea 09/27/2010    Untreated, REM 64.7/hr AHI 18.5/hr RDI 19.2/hr. Patuent refused CPAP therapy    Past Surgical History  Procedure Laterality Date  . Cholecystectomy    . Coronary angioplasty with stent placement  2007    A 3.0x88m CYPHER stent post dilated to 3.29 mm the 100% occlusion was reduced to 0%  . Left heart catheterization with coronary angiogram N/A 07/12/2014    Procedure: LEFT HEART CATHETERIZATION WITH CORONARY ANGIOGRAM;  Surgeon: MBlane Ohara MD;  [facility];  Service: Cardiovascular;  Laterality: N/A;    Allergies  Allergen Reactions  . Codeine   . Contrast Media [Iodinated Diagnostic Agents]   . Other     There are 3 other allergies but pt was unable to verify what they were    Current Outpatient Prescriptions  Medication Sig  Dispense Refill  . amLODipine (NORVASC) 10 MG tablet TAKE 1 TABLET (10 MG TOTAL) BY MOUTH DAILY. 30 tablet 9  . aspirin 81 MG tablet Take 81 mg by mouth daily.    . carvedilol (COREG) 12.5 MG tablet Take 1 & 1/2 tablet twice daily 90 tablet 6  . cholecalciferol (VITAMIN D) 1000 UNITS tablet Take 2,000 Units by mouth daily.    . clopidogrel (PLAVIX) 75 MG tablet Take 1 tablet (75 mg total) by mouth daily. 30 tablet 7  . diazepam (VALIUM) 5 MG tablet Take 5 mg by mouth at bedtime as needed for anxiety.    Marland Kitchen esomeprazole (NEXIUM) 40 MG capsule Take 1  capsule (40 mg total) by mouth daily before breakfast. 30 capsule 9  . fluticasone (FLONASE) 50 MCG/ACT nasal spray Place 1 spray into the nose as needed.    . Iron Succinyl-Protein Complex 40 MG/15ML SOLN Take 40 mg by mouth daily.    Marland Kitchen losartan (COZAAR) 50 MG tablet Take 1 tablet (50 mg total) by mouth daily. 30 tablet 6  . nitroGLYCERIN (NITROSTAT) 0.4 MG SL tablet Place 0.4 mg under the tongue every 5 (five) minutes as needed.    . rosuvastatin (CRESTOR) 20 MG tablet Take 1 tablet (20 mg total) by mouth daily. 30 tablet 6   No current facility-administered medications for this visit.    History   Social History  . Marital Status: Single    Spouse Name: N/A  . Number of Children: N/A  . Years of Education: N/A   Occupational History  . Not on file.   Social History Main Topics  . Smoking status: Never Smoker   . Smokeless tobacco: Never Used  . Alcohol Use: No  . Drug Use: No  . Sexual Activity: Not on file   Other Topics Concern  . Not on file   Social History Narrative   Socially, [name] has 9 children, and 7 grandchildren, and 2 great-grandchildren.  There is no tobacco or alcohol use.  History reviewed. No pertinent family history.  ROS General: Negative; No fevers, chills, or night sweats HEENT: Negative; No changes in vision or hearing, sinus congestion, difficulty swallowing Pulmonary: Negative; No cough, wheezing, shortness of breath, hemoptysis Cardiovascular: See HPI:  GI: Negative; No nausea, vomiting, diarrhea, or abdominal pain GU: Negative; No dysuria, hematuria, or difficulty voiding Musculoskeletal: Positive for bilateral knee discomfort Hematologic: History of anemia no easy bruising, bleeding Endocrine: Negative; no heat/cold intolerance; no diabetes, Neuro: Negative; no changes in balance, headaches Skin: Negative; No rashes or skin lesions Psychiatric: Negative; No behavioral problems, depression Sleep: Positive for sleep apnea , refused CPAP  therapy, no hypersomnolence, bruxism, restless legs, hypnogognic hallucinations. Other comprehensive 14 point system review is negative   Physical Exam BP 148/84 mmHg  Pulse 66  Ht '5\' 5"'  (1.651 m)  Wt 217 lb (98.431 kg)  BMI 36.11 kg/m2 General: Alert, oriented, no distress.  Skin: normal turgor, no rashes, warm and dry HEENT: Normocephalic, atraumatic. Pupils equal round and reactive to light; sclera anicteric; extraocular muscles intact, No lid lag; Nose without nasal septal hypertrophy; Mouth/Parynx benign; Mallinpatti scale 3/4 Neck: No JVD, no carotid bruits; normal carotid upstroke Lungs: clear to ausculatation and percussion bilaterally; no wheezing or rales, normal inspiratory and expiratory effort Chest wall: without tenderness to palpitation Heart: PMI not displaced, RRR, s1 s2 normal, 1/6 systolic murmur, No diastolic murmur, no rubs, gallops, thrills, or heaves Abdomen: Central adiposity; soft, nontender; no hepatosplenomehaly, BS+; abdominal aorta nontender and  not dilated by palpation. Back: no CVA tenderness Pulses: 2+  Musculoskeletal: full range of motion, normal strength, no joint deformities Extremities: Pulses 2+, no clubbing cyanosis or edema, Homan's sign negative  Neurologic: grossly nonfocal; Cranial nerves grossly wnl Psychologic: Normal mood and affect  ECG (independently read by me): Normal sinus rhythm at 66 bpm.  Right bundle-branch block with repolarization changes.  Left anterior hemiblock.  No significant ST segment changes other than repolarization abnormalities and right bundle branch block leads.  December 2015 ECG (independently read by me): Normal sinus rhythm at 65 bpm.  Right bundle-branch block with repolarization changes.  PR interval 182 [name].  QTc interval 484 [name].   LABS:  BMET  BMP Latest Ref Rng 07/11/2014 07/10/2014 03/02/2014  Glucose 70 - 99 mg/dL 109(H) 124(H) 98  BUN 6 - 23 mg/dL '12 13 17  ' Creatinine 0.50 - 1.10 mg/dL 0.91 0.87 0.96    Sodium 137 - 147 mEq/L 141 141 140  Potassium 3.7 - 5.3 mEq/L 4.2 3.7 4.6  Chloride 96 - 112 mEq/L 105 105 104  CO2 19 - 32 mEq/L '25 23 29  ' Calcium 8.4 - 10.5 mg/dL 8.9 9.5 9.2     Hepatic Function Panel   Hepatic Function Latest Ref Rng 07/11/2014 07/10/2014 03/02/2014  Total Protein 6.0 - 8.3 g/dL 6.3 7.5 6.8  Albumin 3.5 - 5.2 g/dL 3.0(L) 3.6 3.7  AST 0 - 37 U/L '13 15 14  ' ALT 0 - 35 U/L '11 14 11  ' Alk Phosphatase 39 - 117 U/L 74 89 72  Total Bilirubin 0.3 - 1.2 mg/dL 0.2(L) 0.3 0.4     CBC  CBC Latest Ref Rng 07/11/2014 07/10/2014 03/02/2014  WBC 4.0 - 10.5 K/uL 5.9 6.2 6.3  Hemoglobin 12.0 - 15.0 g/dL 11.3(L) 12.9 12.3  Hematocrit 36.0 - 46.0 % 34.8(L) 38.4 37.9  Platelets 150 - 400 K/uL 216 239 239     BNP    Component Value Date/Time   PROBNP 67.9 12/01/2011 1035    Lipid Panel     Component Value Date/Time   CHOL 171 03/02/2014 1057   TRIG 87 03/02/2014 1057   HDL 56 03/02/2014 1057   CHOLHDL 3.1 03/02/2014 1057   VLDL 17 03/02/2014 1057   LDLCALC 98 03/02/2014 1057     RADIOLOGY: No results found.    ASSESSMENT AND PLAN: [name] is a 81 year old female who has established CAD and suffered an acute coronary syndrome in 2007 and underwent PCI to a totally occluded ramus intermediate vessel.  Remotely, [name] had undergone prior stenting to her RCA and had been demonstrated to have concomitant CAD. Her most recent cardiac catheterization  revealed patent LAD and RCA stents.  The circumflex had stable 50% stenosis in the distal AV groove before the left PDA which was unchanged from 2007.  [name] also was noted to have an intramyocardial bridge in the mid LAD.  When I last saw her, her blood pressure was a dated and I further titrated her carvedilol.  Today, her blood pressure on repeat by me was 128/80 on carvedilol 18.75 twice a day, losartan 50 mg daily and Norvasc 10 mg.  [name] is on dual antiplatelet therapy with aspirin and Plavix.  There is no bleeding.  [name] is  on Crestor for hyperlipidemia.  [name] was supposed follow-up laboratory prior to her office visit, but [name] never had this done.  I will repeat laboratory in the fasting state.  Target LDL is less than 70.  If [name]  is not at target her dose of Crestor will be further titrated.  If her blood pressure does have lability her dose of losartan will be titrated.  We discussed weight loss.  [name] is moderately obese with a body mass index of 36.11.  I will see her in 6 months for reevaluation or sooner if problems arise.  Time spent: 25 minutes  Troy Sine, MD, [facility]  11/15/2014 3:48 PM

## 2014-11-16 ENCOUNTER — Other Ambulatory Visit: Payer: Self-pay | Admitting: Cardiovascular Disease

## 2014-11-16 DIAGNOSIS — Z79899 Other long term (current) drug therapy: Secondary | ICD-10-CM | POA: Diagnosis not present

## 2014-11-16 DIAGNOSIS — M170 Bilateral primary osteoarthritis of knee: Secondary | ICD-10-CM | POA: Diagnosis not present

## 2014-11-16 DIAGNOSIS — E782 Mixed hyperlipidemia: Secondary | ICD-10-CM | POA: Diagnosis not present

## 2014-11-16 DIAGNOSIS — I2510 Atherosclerotic heart disease of native coronary artery without angina pectoris: Secondary | ICD-10-CM | POA: Diagnosis not present

## 2014-11-16 DIAGNOSIS — I10 Essential (primary) hypertension: Secondary | ICD-10-CM | POA: Diagnosis not present

## 2014-11-17 LAB — LIPID PANEL W/O CHOL/HDL RATIO
Cholesterol, Total: 219 mg/dL — ABNORMAL HIGH (ref 100–199)
HDL: 44 mg/dL (ref >39)
LDL Calculated: 141 mg/dL — ABNORMAL HIGH (ref 0–99)
Triglycerides: 168 mg/dL — ABNORMAL HIGH (ref 0–149)
VLDL Cholesterol Cal: 34 mg/dL (ref 5–40)

## 2014-11-17 LAB — COMPREHENSIVE METABOLIC PANEL
ALT: 6 IU/L (ref 0–32)
AST: 9 IU/L (ref 0–40)
Albumin/Globulin Ratio: 1.3 (ref 1.1–2.5)
Albumin: 3.6 g/dL (ref 3.5–4.8)
Alkaline Phosphatase: 75 IU/L (ref 39–117)
BUN/Creatinine Ratio: 13 (ref 11–26)
BUN: 13 mg/dL (ref 8–27)
Bilirubin Total: 0.3 mg/dL (ref 0.0–1.2)
CO2: 23 mmol/L (ref 18–29)
Calcium: 9 mg/dL (ref 8.7–10.3)
Chloride: 104 mmol/L (ref 97–108)
Creatinine, Ser: 0.97 mg/dL (ref 0.57–1.00)
GFR calc Af Amer: 65 mL/min/{1.73_m2} (ref >59)
GFR calc non Af Amer: 56 mL/min/{1.73_m2} — ABNORMAL LOW (ref >59)
Globulin, Total: 2.7 g/dL (ref 1.5–4.5)
Glucose: 113 mg/dL — ABNORMAL HIGH (ref 65–99)
Potassium: 4.1 mmol/L (ref 3.5–5.2)
Sodium: 141 mmol/L (ref 134–144)
Total Protein: 6.3 g/dL (ref 6.0–8.5)

## 2014-11-17 LAB — CBC WITH DIFFERENTIAL/PLATELET
Basophils Absolute: 0 10*3/uL (ref 0.0–0.2)
Basos: 0 %
Eos: 2 %
Eosinophils Absolute: 0.1 10*3/uL (ref 0.0–0.4)
HCT: 37.3 % (ref 34.0–46.6)
Hemoglobin: 11.8 g/dL (ref 11.1–15.9)
Immature Grans (Abs): 0 10*3/uL (ref 0.0–0.1)
Immature Granulocytes: 0 %
Lymphocytes Absolute: 1.8 10*3/uL (ref 0.7–3.1)
Lymphs: 36 %
MCH: 25.6 pg — ABNORMAL LOW (ref 26.6–33.0)
MCHC: 31.6 g/dL (ref 31.5–35.7)
MCV: 81 fL (ref 79–97)
Monocytes Absolute: 0.4 10*3/uL (ref 0.1–0.9)
Monocytes: 8 %
Neutrophils Absolute: 2.7 10*3/uL (ref 1.4–7.0)
Neutrophils Relative %: 54 %
Platelets: 231 10*3/uL (ref 150–379)
RBC: 4.61 x10E6/uL (ref 3.77–5.28)
RDW: 18 % — ABNORMAL HIGH (ref 12.3–15.4)
WBC: 5 10*3/uL (ref 3.4–10.8)

## 2014-11-17 LAB — TSH: TSH: 1.59 u[IU]/mL (ref 0.450–4.500)

## 2014-11-26 ENCOUNTER — Telehealth: Payer: Self-pay | Admitting: *Deleted

## 2014-11-26 DIAGNOSIS — E785 Hyperlipidemia, unspecified: Secondary | ICD-10-CM

## 2014-11-26 DIAGNOSIS — Z79899 Other long term (current) drug therapy: Secondary | ICD-10-CM

## 2014-11-26 MED ORDER — ROSUVASTATIN CALCIUM 40 MG PO TABS: 40.0000 mg | ORAL_TABLET | Freq: Every day | ORAL | Status: DC

## 2014-11-26 NOTE — Telephone Encounter (Signed)
Patient notified of lab results and recommendations. Voiced understanding of instructions given. Crestor 40 mg sent to [facility] [place]. Lab slip sent to patient to have blood checked again in 6-8-weeks after treatment per protocol.

## 2014-11-26 NOTE — Telephone Encounter (Signed)
-----   Message from Lennette Biharihomas A Kelly, MD sent at 11/21/2014 11:38 PM EDT ----- Increase crestor to 40 mg

## 2015-01-19 ENCOUNTER — Other Ambulatory Visit: Payer: Self-pay | Admitting: Cardiovascular Disease

## 2015-01-19 DIAGNOSIS — Z79899 Other long term (current) drug therapy: Secondary | ICD-10-CM | POA: Diagnosis not present

## 2015-01-19 DIAGNOSIS — E785 Hyperlipidemia, unspecified: Secondary | ICD-10-CM | POA: Diagnosis not present

## 2015-01-19 LAB — COMPREHENSIVE METABOLIC PANEL
ALT: 7 IU/L (ref 0–32)
AST: 12 IU/L (ref 0–40)
Albumin/Globulin Ratio: 1.3 (ref 1.1–2.5)
Albumin: 3.8 g/dL (ref 3.5–4.8)
Alkaline Phosphatase: 66 IU/L (ref 39–117)
BUN/Creatinine Ratio: 15 (ref 11–26)
BUN: 14 mg/dL (ref 8–27)
Bilirubin Total: 0.3 mg/dL (ref 0.0–1.2)
CO2: 33 mmol/L — ABNORMAL HIGH (ref 18–29)
Calcium: 9.5 mg/dL (ref 8.7–10.3)
Chloride: 104 mmol/L (ref 97–108)
Creatinine, Ser: 0.93 mg/dL (ref 0.57–1.00)
GFR calc Af Amer: 68 mL/min/{1.73_m2} (ref >59)
GFR calc non Af Amer: 59 mL/min/{1.73_m2} — ABNORMAL LOW (ref >59)
Globulin, Total: 2.9 g/dL (ref 1.5–4.5)
Glucose: 122 mg/dL — ABNORMAL HIGH (ref 65–99)
Potassium: 4.3 mmol/L (ref 3.5–5.2)
Sodium: 139 mmol/L (ref 134–144)
Total Protein: 6.7 g/dL (ref 6.0–8.5)

## 2015-01-19 LAB — LIPID PANEL W/O CHOL/HDL RATIO
Cholesterol, Total: 146 mg/dL (ref 100–199)
HDL: 48 mg/dL (ref >39)
LDL Calculated: 77 mg/dL (ref 0–99)
Triglycerides: 104 mg/dL (ref 0–149)
VLDL Cholesterol Cal: 21 mg/dL (ref 5–40)

## 2015-01-28 DIAGNOSIS — Z0001 Encounter for general adult medical examination with abnormal findings: Secondary | ICD-10-CM | POA: Diagnosis not present

## 2015-01-28 DIAGNOSIS — E6609 Other obesity due to excess calories: Secondary | ICD-10-CM | POA: Diagnosis not present

## 2015-01-28 DIAGNOSIS — Z6835 Body mass index (BMI) 35.0-35.9, adult: Secondary | ICD-10-CM | POA: Diagnosis not present

## 2015-02-03 DIAGNOSIS — M170 Bilateral primary osteoarthritis of knee: Secondary | ICD-10-CM | POA: Diagnosis not present

## 2015-02-08 ENCOUNTER — Encounter: Payer: Self-pay | Admitting: *Deleted

## 2015-02-25 DIAGNOSIS — J209 Acute bronchitis, unspecified: Secondary | ICD-10-CM | POA: Diagnosis not present

## 2015-02-25 DIAGNOSIS — Z6835 Body mass index (BMI) 35.0-35.9, adult: Secondary | ICD-10-CM | POA: Diagnosis not present

## 2015-02-25 DIAGNOSIS — E6609 Other obesity due to excess calories: Secondary | ICD-10-CM | POA: Diagnosis not present

## 2015-02-25 DIAGNOSIS — J069 Acute upper respiratory infection, unspecified: Secondary | ICD-10-CM | POA: Diagnosis not present

## 2015-03-01 ENCOUNTER — Other Ambulatory Visit: Payer: Self-pay | Admitting: Cardiovascular Disease

## 2015-03-01 NOTE — Telephone Encounter (Signed)
Rx has been sent to the pharmacy electronically. ° °

## 2015-05-02 ENCOUNTER — Ambulatory Visit (HOSPITAL_COMMUNITY)
Admission: RE | Admit: 2015-05-02 | Discharge: 2015-05-02 | Disposition: A | Discharge status: Home / Self Care | Payer: Medicare Other | Source: Ambulatory Visit | Attending: Physician Assistant | Admitting: Physician Assistant

## 2015-05-02 ENCOUNTER — Other Ambulatory Visit (HOSPITAL_COMMUNITY): Payer: Self-pay | Admitting: Physician Assistant

## 2015-05-02 DIAGNOSIS — M1991 Primary osteoarthritis, unspecified site: Secondary | ICD-10-CM | POA: Diagnosis not present

## 2015-05-02 DIAGNOSIS — R05 Cough: Secondary | ICD-10-CM

## 2015-05-02 DIAGNOSIS — N390 Urinary tract infection, site not specified: Secondary | ICD-10-CM | POA: Diagnosis not present

## 2015-05-02 DIAGNOSIS — I517 Cardiomegaly: Secondary | ICD-10-CM | POA: Insufficient documentation

## 2015-05-02 DIAGNOSIS — Z1389 Encounter for screening for other disorder: Secondary | ICD-10-CM | POA: Diagnosis not present

## 2015-05-02 DIAGNOSIS — Z6834 Body mass index (BMI) 34.0-34.9, adult: Secondary | ICD-10-CM | POA: Diagnosis not present

## 2015-05-02 DIAGNOSIS — E6609 Other obesity due to excess calories: Secondary | ICD-10-CM | POA: Diagnosis not present

## 2015-05-02 DIAGNOSIS — R059 Cough, unspecified: Secondary | ICD-10-CM

## 2015-05-02 DIAGNOSIS — R3919 Other difficulties with micturition: Secondary | ICD-10-CM | POA: Diagnosis not present

## 2015-05-31 ENCOUNTER — Ambulatory Visit (INDEPENDENT_AMBULATORY_CARE_PROVIDER_SITE_OTHER): Payer: Medicare Other | Admitting: Cardiovascular Disease

## 2015-05-31 VITALS — BP 164/92 | HR 65 | Ht 65.0 in | Wt 210.6 lb

## 2015-05-31 DIAGNOSIS — I200 Unstable angina: Secondary | ICD-10-CM | POA: Diagnosis not present

## 2015-05-31 DIAGNOSIS — I25810 Atherosclerosis of coronary artery bypass graft(s) without angina pectoris: Secondary | ICD-10-CM | POA: Diagnosis not present

## 2015-05-31 DIAGNOSIS — E785 Hyperlipidemia, unspecified: Secondary | ICD-10-CM

## 2015-05-31 DIAGNOSIS — G4733 Obstructive sleep apnea (adult) (pediatric): Secondary | ICD-10-CM

## 2015-05-31 DIAGNOSIS — E669 Obesity, unspecified: Secondary | ICD-10-CM

## 2015-05-31 DIAGNOSIS — I10 Essential (primary) hypertension: Secondary | ICD-10-CM

## 2015-05-31 MED ORDER — LOSARTAN POTASSIUM 50 MG PO TABS: 75.0000 mg | ORAL_TABLET | Freq: Every day | ORAL | Status: DC

## 2015-05-31 NOTE — Patient Instructions (Signed)
Your physician has recommended you make the following change in your medication: the losartan has been increased to 75 mg daily. If your top number (systolic pressure) continues to stay above 140 then increase to 100 mg daily.   Your physician wants you to follow-up in: 6 months or sooner if needed. You will receive a reminder letter in the mail two months in advance. If you don't receive a letter, please call our office to schedule the follow-up appointment.

## 2015-06-01 MED ORDER — NITROGLYCERIN 0.4 MG SL SUBL: 0.4000 mg | SUBLINGUAL_TABLET | SUBLINGUAL | Status: DC | PRN

## 2015-06-02 ENCOUNTER — Encounter: Payer: Self-pay | Admitting: Cardiovascular Disease

## 2015-06-02 NOTE — Progress Notes (Signed)
Patient ID: [name], female   DOB: February 09, 1934, 81 y.o.   MRN: [id number]     HPI: [name] is a 81 y.o. female who presents to the office today for a 6 month follow up cardiology evaluation.  [name] has a history of known CAD, hypertension, obesity, hyperlipidemia, as well as untreated sleep apnea, for which he has refused CPAP therapy.  She suffered an acute coronary syndrome and underwent PCI to a totally occluded ramus intermediate vessel in 2007.  She had concomitant CAD with 60% distal circumflex stenosis and mid LAD myocardial muscle bridging.  Remotely, she had undergone stenting to RCA. A stress test in December 2012 showed a mild to moderate defect in the basal anterolateral region without significant ischemia with breast attenuation.   A followup nuclear perfusion study on 01/20/2014 remained  low risk and showed mild to moderate basal, mid, anterolateral scar without reversible ischemia, unchanged from her prior study.  In June 2015, I titrated her amlodipine for more optimal blood pressure control.  On 07/10/2014 , she was admitted to the hospital with chest pressure.  Cardiac enzymes were cycled and were negative 3.  She underwent cardiac catheterization which with showed patent stents in the ramus and RCA.  Ejection fraction was 75%.  She was started on carvedilol 12.5 mg twice a day and continued on aspirin, Plavix, Crestor 20 mg, and amlodipine 10 mg.  Since I last saw her in March 2016 she denies recurrent chest pain.  She admits to significant pain in her knees bilaterally due to arthritic bone-on-bone condition.  She has trying to lose weight and has lost approximately 7 pounds.  She denies palpitations.  She tells me laboratory was recently checked in [place].  She presents for follow-up evaluation.  Past Medical History  Diagnosis Date  . Coronary artery disease 11/2009    2D Echo EF>55  . Hypertension   . High cholesterol   . Reflux esophagitis   . Obesity    . Arthritis   . Sleep apnea 09/27/2010    Untreated, REM 64.7/hr AHI 18.5/hr RDI 19.2/hr. Patuent refused CPAP therapy    Past Surgical History  Procedure Laterality Date  . Cholecystectomy    . Coronary angioplasty with stent placement  2007    A 3.0x75m CYPHER stent post dilated to 3.29 mm the 100% occlusion was reduced to 0%  . Left heart catheterization with coronary angiogram N/A 07/12/2014    Procedure: LEFT HEART CATHETERIZATION WITH CORONARY ANGIOGRAM;  Surgeon: MBlane Ohara MD;  [facility];  Service: Cardiovascular;  Laterality: N/A;    Allergies  Allergen Reactions  . Codeine   . Contrast Media [Iodinated Diagnostic Agents]   . Other     There are 3 other allergies but pt was unable to verify what they were    Current Outpatient Prescriptions  Medication Sig Dispense Refill  . amLODipine (NORVASC) 10 MG tablet TAKE 1 TABLET (10 MG TOTAL) BY MOUTH DAILY. 30 tablet 9  . aspirin 81 MG tablet Take 81 mg by mouth daily.    . carvedilol (COREG) 12.5 MG tablet TAKE 1 & 1/2 TABLET TWICE DAILY 90 tablet 6  . cholecalciferol (VITAMIN D) 1000 UNITS tablet Take 2,000 Units by mouth daily.    . clopidogrel (PLAVIX) 75 MG tablet Take 1 tablet (75 mg total) by mouth daily. 30 tablet 7  . diazepam (VALIUM) 5 MG tablet Take 5 mg by mouth at bedtime as needed for anxiety.    .Marland Kitchen  fluticasone (FLONASE) 50 MCG/ACT nasal spray Place 1 spray into the nose as needed.    . Iron Succinyl-Protein Complex 40 MG/15ML SOLN Take 40 mg by mouth daily.    Marland Kitchen losartan (COZAAR) 50 MG tablet Take 1.5 tablets (75 mg total) by mouth daily. 45 tablet 6  . NEXIUM 40 MG capsule TAKE 1 CAPSULE (40 MG TOTAL) BY MOUTH DAILY BEFORE BREAKFAST. 30 capsule 9  . nitroGLYCERIN (NITROSTAT) 0.4 MG SL tablet Place 1 tablet (0.4 mg total) under the tongue every 5 (five) minutes as needed. 25 tablet 3  . rosuvastatin (CRESTOR) 40 MG tablet Take 1 tablet (40 mg total) by mouth daily. 90 tablet 3   No current  facility-administered medications for this visit.    Social History   Social History  . Marital Status: Single    Spouse Name: N/A  . Number of Children: N/A  . Years of Education: N/A   Occupational History  . Not on file.   Social History Main Topics  . Smoking status: Never Smoker   . Smokeless tobacco: Never Used  . Alcohol Use: No  . Drug Use: No  . Sexual Activity: Not on file   Other Topics Concern  . Not on file   Social History Narrative   Socially, she has 9 children, and 7 grandchildren, and 2 great-grandchildren.  There is no tobacco or alcohol use.  History reviewed. No pertinent family history.  ROS General: Negative; No fevers, chills, or night sweats HEENT: Negative; No changes in vision or hearing, sinus congestion, difficulty swallowing Pulmonary: Negative; No cough, wheezing, shortness of breath, hemoptysis Cardiovascular: See HPI:  GI: Negative; No nausea, vomiting, diarrhea, or abdominal pain GU: Negative; No dysuria, hematuria, or difficulty voiding Musculoskeletal: Positive for bilateral knee discomfort Hematologic: History of anemia no easy bruising, bleeding Endocrine: Negative; no heat/cold intolerance; no diabetes, Neuro: Negative; no changes in balance, headaches Skin: Negative; No rashes or skin lesions Psychiatric: Negative; No behavioral problems, depression Sleep: Positive for sleep apnea , refused CPAP therapy, no hypersomnolence, bruxism, restless legs, hypnogognic hallucinations. Other comprehensive 14 point system review is negative   Physical Exam BP 164/92 mmHg  Pulse 65  Ht '5\' 5"'  (1.651 m)  Wt 210 lb 9.6 oz (95.528 kg)  BMI 35.05 kg/m2  Repeat blood pressure by me 138/84. General: Alert, oriented, no distress.  Skin: normal turgor, no rashes, warm and dry HEENT: Normocephalic, atraumatic. Pupils equal round and reactive to light; sclera anicteric; extraocular muscles intact, No lid lag; Nose without nasal septal  hypertrophy; Mouth/Parynx benign; Mallinpatti scale 3/4 Neck: No JVD, no carotid bruits; normal carotid upstroke Lungs: clear to ausculatation and percussion bilaterally; no wheezing or rales, normal inspiratory and expiratory effort Chest wall: without tenderness to palpitation Heart: PMI not displaced, RRR, s1 s2 normal, 1/6 systolic murmur, No diastolic murmur, no rubs, gallops, thrills, or heaves Abdomen: Central adiposity; soft, nontender; no hepatosplenomehaly, BS+; abdominal aorta nontender and not dilated by palpation. Back: no CVA tenderness Pulses: 2+  Musculoskeletal: full range of motion, normal strength, no joint deformities Extremities: Pulses 2+, no clubbing cyanosis or edema, Homan's sign negative  Neurologic: grossly nonfocal; Cranial nerves grossly wnl Psychologic: Normal mood and affect  ECG (independently read by me): Normal sinus rhythm with right bundle branch block and repolarization changes.  QTC 42 ms.  ECG (independently read by me): Normal sinus rhythm at 66 bpm.  Right bundle-branch block with repolarization changes.  Left anterior hemiblock.  No significant ST segment changes other  than repolarization abnormalities and right bundle branch block leads.  December 2015 ECG (independently read by me): Normal sinus rhythm at 65 bpm.  Right bundle-branch block with repolarization changes.  PR interval 182 ms.  QTc interval 484 ms.   LABS:  BMP Latest Ref Rng 01/19/2015 11/16/2014 07/11/2014  Glucose 65 - 99 mg/dL 122(H) 113(H) 109(H)  BUN 8 - 27 mg/dL '14 13 12  ' Creatinine 0.57 - 1.00 mg/dL 0.93 0.97 0.91  BUN/Creat Ratio 11 - '26 15 13 ' -  Sodium 134 - 144 mmol/L 139 141 141  Potassium 3.5 - 5.2 mmol/L 4.3 4.1 4.2  Chloride 97 - 108 mmol/L 104 104 105  CO2 18 - 29 mmol/L 33(H) 23 25  Calcium 8.7 - 10.3 mg/dL 9.5 9.0 8.9    Hepatic Function Latest Ref Rng 01/19/2015 11/16/2014 07/11/2014  Total Protein 6.0 - 8.5 g/dL 6.7 6.3 6.3  Albumin 3.5 - 5.2 g/dL - - 3.0(L)  AST  0 - 40 IU/L '12 9 13  ' ALT 0 - 32 IU/L '7 6 11  ' Alk Phosphatase 39 - 117 IU/L 66 75 74  Total Bilirubin 0.0 - 1.2 mg/dL 0.3 0.3 0.2(L)    CBC Latest Ref Rng 11/16/2014 07/11/2014 07/10/2014  WBC 3.4 - 10.8 x10E3/uL 5.0 5.9 6.2  Hemoglobin 11.1 - 15.9 g/dL 11.8 11.3(L) 12.9  Hematocrit 34.0 - 46.6 % 37.3 34.8(L) 38.4  Platelets 150 - 379 x10E3/uL 231 216 239   Lab Results  Component Value Date   MCV 81 11/16/2014   MCV 78.4 07/11/2014   MCV 78.2 07/10/2014   Lab Results  Component Value Date   TSH 1.590 11/16/2014   Lab Results  Component Value Date   HGBA1C 6.0* 03/02/2014       Component Value Date/Time   PROBNP 67.9 12/01/2011 1035   Lipid Panel     Component Value Date/Time   CHOL 146 01/19/2015 1048   CHOL 171 03/02/2014 1057   TRIG 104 01/19/2015 1048   HDL 48 01/19/2015 1048   HDL 56 03/02/2014 1057   CHOLHDL 3.1 03/02/2014 1057   VLDL 17 03/02/2014 1057   LDLCALC 77 01/19/2015 1048   LDLCALC 98 03/02/2014 1057     RADIOLOGY: No results found.    ASSESSMENT AND PLAN: Ms. Carrolyn Leigh is a 81 year old female who has established CAD and suffered an acute coronary syndrome in 2007 and underwent PCI to a totally occluded ramus intermediate vessel.  Remotely, she had undergone prior stenting to her RCA and had been demonstrated to have concomitant CAD. Her most recent cardiac catheterization  revealed patent LAD and RCA stents.  The circumflex had stable 50% stenosis in the distal AV groove before the left PDA which was unchanged from 2007.  She also was noted to have an intramyocardial bridge in the mid LAD.  The last year, her medications have been tried traded for improved blood pressure control.  Her blood pressure upon arrival today was elevated at 164/92, but on repeat by me was 138/84.  She has been on losartan 50 mg, carvedilol 18.75 twice a day, and amlodipine 10 mg.  I will further titrate her losartan to 75 mg daily.  She will monitor her blood pressure.  If she  notes that her blood pressure is staying above 875 systolically,I have instructed that she further titrate her losartan to 100 mg daily.  She continues to be on Crestor 40 mg for hyperlipidemia.  Target LDL is less than 70 and she is almost there  at 64 with her lab work in May.  She continues to be on dual antiplatelets therapy.  There is no bleeding. I will see her in 6 months for cardiology evaluation.    Time spent: 25 minutes  Troy Sine, MD, [facility]  06/02/2015 5:35 PM

## 2015-06-10 DIAGNOSIS — M170 Bilateral primary osteoarthritis of knee: Secondary | ICD-10-CM | POA: Diagnosis not present

## 2015-06-10 DIAGNOSIS — M25562 Pain in left knee: Secondary | ICD-10-CM | POA: Diagnosis not present

## 2015-06-10 DIAGNOSIS — M25561 Pain in right knee: Secondary | ICD-10-CM | POA: Diagnosis not present

## 2015-06-10 DIAGNOSIS — R262 Difficulty in walking, not elsewhere classified: Secondary | ICD-10-CM | POA: Diagnosis not present

## 2015-06-17 DIAGNOSIS — R262 Difficulty in walking, not elsewhere classified: Secondary | ICD-10-CM | POA: Diagnosis not present

## 2015-06-17 DIAGNOSIS — M1711 Unilateral primary osteoarthritis, right knee: Secondary | ICD-10-CM | POA: Diagnosis not present

## 2015-06-17 DIAGNOSIS — M25562 Pain in left knee: Secondary | ICD-10-CM | POA: Diagnosis not present

## 2015-06-17 DIAGNOSIS — M170 Bilateral primary osteoarthritis of knee: Secondary | ICD-10-CM | POA: Diagnosis not present

## 2015-06-17 DIAGNOSIS — M25561 Pain in right knee: Secondary | ICD-10-CM | POA: Diagnosis not present

## 2015-06-17 DIAGNOSIS — R2689 Other abnormalities of gait and mobility: Secondary | ICD-10-CM | POA: Diagnosis not present

## 2015-06-22 ENCOUNTER — Telehealth: Payer: Self-pay | Admitting: Cardiovascular Disease

## 2015-06-22 DIAGNOSIS — M25562 Pain in left knee: Secondary | ICD-10-CM | POA: Diagnosis not present

## 2015-06-22 DIAGNOSIS — M1712 Unilateral primary osteoarthritis, left knee: Secondary | ICD-10-CM | POA: Diagnosis not present

## 2015-06-22 DIAGNOSIS — R269 Unspecified abnormalities of gait and mobility: Secondary | ICD-10-CM | POA: Diagnosis not present

## 2015-06-22 DIAGNOSIS — M170 Bilateral primary osteoarthritis of knee: Secondary | ICD-10-CM | POA: Diagnosis not present

## 2015-06-22 DIAGNOSIS — M25561 Pain in right knee: Secondary | ICD-10-CM | POA: Diagnosis not present

## 2015-06-22 NOTE — Telephone Encounter (Signed)
[facility] forms for Dr Tresa EndoKelly to review and sign. FMLA [employer] Form, Check and signed Auth brought to office by daughter Tobi Bastosnna.  Sent [facility] @ [employer].  Sent via Courier on 06/22/15. lp

## 2015-06-24 DIAGNOSIS — M25562 Pain in left knee: Secondary | ICD-10-CM | POA: Diagnosis not present

## 2015-06-24 DIAGNOSIS — M1711 Unilateral primary osteoarthritis, right knee: Secondary | ICD-10-CM | POA: Diagnosis not present

## 2015-06-24 DIAGNOSIS — M170 Bilateral primary osteoarthritis of knee: Secondary | ICD-10-CM | POA: Diagnosis not present

## 2015-06-24 DIAGNOSIS — M25561 Pain in right knee: Secondary | ICD-10-CM | POA: Diagnosis not present

## 2015-06-24 DIAGNOSIS — R269 Unspecified abnormalities of gait and mobility: Secondary | ICD-10-CM | POA: Diagnosis not present

## 2015-06-28 DIAGNOSIS — M170 Bilateral primary osteoarthritis of knee: Secondary | ICD-10-CM | POA: Diagnosis not present

## 2015-06-28 DIAGNOSIS — R269 Unspecified abnormalities of gait and mobility: Secondary | ICD-10-CM | POA: Diagnosis not present

## 2015-06-28 DIAGNOSIS — M1712 Unilateral primary osteoarthritis, left knee: Secondary | ICD-10-CM | POA: Diagnosis not present

## 2015-06-28 DIAGNOSIS — M25562 Pain in left knee: Secondary | ICD-10-CM | POA: Diagnosis not present

## 2015-06-28 DIAGNOSIS — M25561 Pain in right knee: Secondary | ICD-10-CM | POA: Diagnosis not present

## 2015-07-01 DIAGNOSIS — M25561 Pain in right knee: Secondary | ICD-10-CM | POA: Diagnosis not present

## 2015-07-01 DIAGNOSIS — M1711 Unilateral primary osteoarthritis, right knee: Secondary | ICD-10-CM | POA: Diagnosis not present

## 2015-07-06 DIAGNOSIS — M25562 Pain in left knee: Secondary | ICD-10-CM | POA: Diagnosis not present

## 2015-07-06 DIAGNOSIS — M25561 Pain in right knee: Secondary | ICD-10-CM | POA: Diagnosis not present

## 2015-07-06 DIAGNOSIS — R269 Unspecified abnormalities of gait and mobility: Secondary | ICD-10-CM | POA: Diagnosis not present

## 2015-07-06 DIAGNOSIS — M1712 Unilateral primary osteoarthritis, left knee: Secondary | ICD-10-CM | POA: Diagnosis not present

## 2015-07-06 DIAGNOSIS — M170 Bilateral primary osteoarthritis of knee: Secondary | ICD-10-CM | POA: Diagnosis not present

## 2015-07-07 ENCOUNTER — Telehealth: Payer: Self-pay | Admitting: Cardiovascular Disease

## 2015-07-07 NOTE — Telephone Encounter (Signed)
Patient had received FMLA signed AUTH back from [employer] for Dates of Service to be completed.  She completed and dropped form off.  Sent corrected AUTH form to [employer] on 07/07/15 via Courier.  lp

## 2015-07-08 DIAGNOSIS — M1711 Unilateral primary osteoarthritis, right knee: Secondary | ICD-10-CM | POA: Diagnosis not present

## 2015-07-08 DIAGNOSIS — R2689 Other abnormalities of gait and mobility: Secondary | ICD-10-CM | POA: Diagnosis not present

## 2015-07-08 DIAGNOSIS — M25561 Pain in right knee: Secondary | ICD-10-CM | POA: Diagnosis not present

## 2015-07-08 DIAGNOSIS — M170 Bilateral primary osteoarthritis of knee: Secondary | ICD-10-CM | POA: Diagnosis not present

## 2015-07-08 DIAGNOSIS — M25562 Pain in left knee: Secondary | ICD-10-CM | POA: Diagnosis not present

## 2015-07-13 DIAGNOSIS — M25562 Pain in left knee: Secondary | ICD-10-CM | POA: Diagnosis not present

## 2015-07-13 DIAGNOSIS — M1712 Unilateral primary osteoarthritis, left knee: Secondary | ICD-10-CM | POA: Diagnosis not present

## 2015-07-13 DIAGNOSIS — R2689 Other abnormalities of gait and mobility: Secondary | ICD-10-CM | POA: Diagnosis not present

## 2015-07-13 DIAGNOSIS — M170 Bilateral primary osteoarthritis of knee: Secondary | ICD-10-CM | POA: Diagnosis not present

## 2015-07-13 DIAGNOSIS — M25561 Pain in right knee: Secondary | ICD-10-CM | POA: Diagnosis not present

## 2015-07-19 ENCOUNTER — Telehealth: Payer: Self-pay | Admitting: Cardiovascular Disease

## 2015-07-19 MED ORDER — FUROSEMIDE 40 MG PO TABS: 40.0000 mg | ORAL_TABLET | Freq: Every day | ORAL | Status: DC | PRN

## 2015-07-19 NOTE — Telephone Encounter (Signed)
Pt says her feet keep swelling,She would like some Lasix or something called in for it.

## 2015-07-19 NOTE — Telephone Encounter (Signed)
Swelling in her feet when on them a lot. She elevates them and this goes down. She used to have lasix - does not have this anymore. The swelling started last Friday - went on a trip over the weekend and was in the car about 4 hours on Friday evening. She elevated her legs Saturday which helped. Yesterday she was doing "things around here". Denies shortness of breath. Swelling localized to just feet.   Routed to DOD to advise if OK to Rx lasix prn for a few days for patient.

## 2015-07-19 NOTE — Telephone Encounter (Signed)
Yes, may take furosemide 40 mg once daily as needed for swelling. Please increase intake of K rich foods. Advise on sodium restriction and keeep log of daily weights. If she has to take furosemide for > 3 consecutive days, please call for additional advice.

## 2015-07-19 NOTE — Telephone Encounter (Signed)
Spoke with patient and informed her of MD recommendations. She voiced understanding. Rx(s) sent to pharmacy electronically.

## 2015-07-19 NOTE — Telephone Encounter (Signed)
agree

## 2015-07-27 DIAGNOSIS — H43813 Vitreous degeneration, bilateral: Secondary | ICD-10-CM | POA: Diagnosis not present

## 2015-07-27 DIAGNOSIS — H40013 Open angle with borderline findings, low risk, bilateral: Secondary | ICD-10-CM | POA: Diagnosis not present

## 2015-07-27 DIAGNOSIS — H2513 Age-related nuclear cataract, bilateral: Secondary | ICD-10-CM | POA: Diagnosis not present

## 2015-08-08 DIAGNOSIS — Z1389 Encounter for screening for other disorder: Secondary | ICD-10-CM | POA: Diagnosis not present

## 2015-08-08 DIAGNOSIS — E6609 Other obesity due to excess calories: Secondary | ICD-10-CM | POA: Diagnosis not present

## 2015-08-08 DIAGNOSIS — J301 Allergic rhinitis due to pollen: Secondary | ICD-10-CM | POA: Diagnosis not present

## 2015-08-08 DIAGNOSIS — Z6836 Body mass index (BMI) 36.0-36.9, adult: Secondary | ICD-10-CM | POA: Diagnosis not present

## 2015-08-08 DIAGNOSIS — J209 Acute bronchitis, unspecified: Secondary | ICD-10-CM | POA: Diagnosis not present

## 2015-08-28 ENCOUNTER — Inpatient Hospital Stay
Admission: EM | Admit: 2015-08-28 | Discharge: 2015-09-02 | DRG: 310 | Disposition: A | Discharge status: Home Health | Payer: Medicare Other | Attending: Internal Medicine | Admitting: Internal Medicine

## 2015-08-28 ENCOUNTER — Encounter: Payer: Self-pay | Admitting: Emergency Medicine

## 2015-08-28 DIAGNOSIS — Z91041 Radiographic dye allergy status: Secondary | ICD-10-CM

## 2015-08-28 DIAGNOSIS — R110 Nausea: Secondary | ICD-10-CM | POA: Diagnosis not present

## 2015-08-28 DIAGNOSIS — Z9861 Coronary angioplasty status: Secondary | ICD-10-CM

## 2015-08-28 DIAGNOSIS — E785 Hyperlipidemia, unspecified: Secondary | ICD-10-CM | POA: Diagnosis not present

## 2015-08-28 DIAGNOSIS — E782 Mixed hyperlipidemia: Secondary | ICD-10-CM | POA: Insufficient documentation

## 2015-08-28 DIAGNOSIS — I10 Essential (primary) hypertension: Secondary | ICD-10-CM | POA: Diagnosis present

## 2015-08-28 DIAGNOSIS — Z955 Presence of coronary angioplasty implant and graft: Secondary | ICD-10-CM

## 2015-08-28 DIAGNOSIS — Z885 Allergy status to narcotic agent status: Secondary | ICD-10-CM

## 2015-08-28 DIAGNOSIS — R42 Dizziness and giddiness: Secondary | ICD-10-CM | POA: Diagnosis not present

## 2015-08-28 DIAGNOSIS — Z79899 Other long term (current) drug therapy: Secondary | ICD-10-CM

## 2015-08-28 DIAGNOSIS — R55 Syncope and collapse: Secondary | ICD-10-CM | POA: Diagnosis not present

## 2015-08-28 DIAGNOSIS — Z6834 Body mass index (BMI) 34.0-34.9, adult: Secondary | ICD-10-CM

## 2015-08-28 DIAGNOSIS — I2510 Atherosclerotic heart disease of native coronary artery without angina pectoris: Secondary | ICD-10-CM | POA: Diagnosis present

## 2015-08-28 DIAGNOSIS — Z7982 Long term (current) use of aspirin: Secondary | ICD-10-CM

## 2015-08-28 DIAGNOSIS — G4730 Sleep apnea, unspecified: Secondary | ICD-10-CM | POA: Diagnosis present

## 2015-08-28 DIAGNOSIS — K219 Gastro-esophageal reflux disease without esophagitis: Secondary | ICD-10-CM | POA: Diagnosis not present

## 2015-08-28 DIAGNOSIS — R001 Bradycardia, unspecified: Principal | ICD-10-CM | POA: Insufficient documentation

## 2015-08-28 DIAGNOSIS — M1990 Unspecified osteoarthritis, unspecified site: Secondary | ICD-10-CM | POA: Diagnosis present

## 2015-08-28 DIAGNOSIS — G4733 Obstructive sleep apnea (adult) (pediatric): Secondary | ICD-10-CM | POA: Diagnosis present

## 2015-08-28 DIAGNOSIS — Z7902 Long term (current) use of antithrombotics/antiplatelets: Secondary | ICD-10-CM

## 2015-08-28 DIAGNOSIS — W19XXXA Unspecified fall, initial encounter: Secondary | ICD-10-CM

## 2015-08-28 DIAGNOSIS — R1906 Epigastric swelling, mass or lump: Secondary | ICD-10-CM | POA: Insufficient documentation

## 2015-08-28 LAB — CBC
HCT: 37.5 % (ref 35.0–47.0)
Hemoglobin: 12.4 g/dL (ref 12.0–16.0)
MCH: 28.3 pg (ref 26.0–34.0)
MCHC: 33.1 g/dL (ref 32.0–36.0)
MCV: 85.3 fL (ref 80.0–100.0)
Platelets: 174 10*3/uL (ref 150–440)
RBC: 4.39 MIL/uL (ref 3.80–5.20)
RDW: 16.6 % — ABNORMAL HIGH (ref 11.5–14.5)
WBC: 7.2 10*3/uL (ref 3.6–11.0)

## 2015-08-28 LAB — BASIC METABOLIC PANEL
Anion gap: 5 (ref 5–15)
BUN: 21 mg/dL — ABNORMAL HIGH (ref 6–20)
CO2: 25 mmol/L (ref 22–32)
Calcium: 8.8 mg/dL — ABNORMAL LOW (ref 8.9–10.3)
Chloride: 111 mmol/L (ref 101–111)
Creatinine, Ser: 1.08 mg/dL — ABNORMAL HIGH (ref 0.44–1.00)
GFR calc Af Amer: 55 mL/min — ABNORMAL LOW (ref >60)
GFR calc non Af Amer: 48 mL/min — ABNORMAL LOW (ref >60)
Glucose, Bld: 130 mg/dL — ABNORMAL HIGH (ref 65–99)
Potassium: 3.9 mmol/L (ref 3.5–5.1)
Sodium: 141 mmol/L (ref 135–145)

## 2015-08-28 LAB — TROPONIN I: Troponin I: <0.03 ng/mL (ref <0.031)

## 2015-08-28 MED ORDER — ONDANSETRON HCL 4 MG/2ML IJ SOLN
4.0000 mg | Freq: Once | INTRAMUSCULAR | Status: AC
  Administered 2015-08-28: 4 mg via INTRAVENOUS
  Filled 2015-08-28: qty 2

## 2015-08-28 MED ORDER — SODIUM CHLORIDE 0.9 % IJ SOLN
3.0000 mL | Freq: Two times a day (BID) | INTRAMUSCULAR | Status: DC
  Administered 2015-08-28 – 2015-09-02 (×8): 3 mL via INTRAVENOUS

## 2015-08-28 MED ORDER — SODIUM CHLORIDE 0.9 % IV SOLN
INTRAVENOUS | Status: DC
  Administered 2015-08-29 – 2015-08-30 (×3): via INTRAVENOUS

## 2015-08-28 MED ORDER — ACETAMINOPHEN 325 MG PO TABS
650.0000 mg | ORAL_TABLET | Freq: Four times a day (QID) | ORAL | Status: DC | PRN
  Administered 2015-08-29 – 2015-09-01 (×3): 650 mg via ORAL
  Filled 2015-08-28 (×2): qty 2

## 2015-08-28 MED ORDER — ACETAMINOPHEN 650 MG RE SUPP
650.0000 mg | Freq: Four times a day (QID) | RECTAL | Status: DC | PRN
Start: 2015-08-28 — End: 2015-09-02

## 2015-08-28 MED ORDER — HYDRALAZINE HCL 20 MG/ML IJ SOLN
10.0000 mg | INTRAMUSCULAR | Status: DC | PRN
  Administered 2015-08-29: 10 mg via INTRAVENOUS
  Filled 2015-08-28: qty 1

## 2015-08-28 MED ORDER — ONDANSETRON HCL 4 MG PO TABS: 4.0000 mg | ORAL_TABLET | Freq: Four times a day (QID) | ORAL | Status: DC | PRN

## 2015-08-28 MED ORDER — ONDANSETRON HCL 4 MG/2ML IJ SOLN
4.0000 mg | Freq: Four times a day (QID) | INTRAMUSCULAR | Status: DC | PRN
  Administered 2015-08-29: 4 mg via INTRAVENOUS
  Filled 2015-08-28: qty 2

## 2015-08-28 MED ORDER — SODIUM CHLORIDE 0.9 % IV BOLUS (SEPSIS)
1000.0000 mL | Freq: Once | INTRAVENOUS | Status: AC
Start: 2015-08-28 — End: 2015-08-28
  Administered 2015-08-28: 1000 mL via INTRAVENOUS

## 2015-08-28 MED ORDER — OXYCODONE HCL 5 MG PO TABS
5.0000 mg | ORAL_TABLET | ORAL | Status: DC | PRN
Start: 2015-08-28 — End: 2015-09-02
  Filled 2015-08-28: qty 1

## 2015-08-28 MED ORDER — HEPARIN SODIUM (PORCINE) 5000 UNIT/ML IJ SOLN
5000.0000 [IU] | Freq: Three times a day (TID) | INTRAMUSCULAR | Status: DC
  Administered 2015-08-29 (×3): 5000 [IU] via SUBCUTANEOUS
  Filled 2015-08-28 (×3): qty 1

## 2015-08-28 NOTE — ED Notes (Signed)
Pt given a sandwich tray ?

## 2015-08-28 NOTE — ED Notes (Signed)
Report from susan, rn. Pt eating sandwich tray, denies complaints , call bell at side.

## 2015-08-28 NOTE — ED Notes (Signed)
Pt assisted up to restroom without difficulty. Pt readjusted in bed for comfort.

## 2015-08-28 NOTE — Care Management Obs Status (Signed)
MEDICARE OBSERVATION STATUS NOTIFICATION   Patient Details  Name: [name] MRN: [id number] Date of Birth: 09/20/1933   Medicare Observation Status Notification Given:  Laurie Panda, RN 08/28/2015, 11:32 PM

## 2015-08-28 NOTE — H&P (Signed)
[facility] [place] at [facility]   PATIENT NAME: [name]    MR#:  [id number]  DATE OF BIRTH:  06-28-1934   DATE OF ADMISSION:  08/28/2015  PRIMARY CARE PHYSICIAN: Colette Ribas, MD   REQUESTING/REFERRING PHYSICIAN: Paduchowski  CHIEF COMPLAINT:   Chief Complaint  Patient presents with  . Dizziness    lighthead and nauseated    HISTORY OF PRESENT ILLNESS:  [name]  is a 81 y.o. female with a known history of coronary artery disease, essential hypertension, hyperlipidemia unspecified who is presenting after almost passing out. She describes 2 instances today first while in the store at [facility] shopping for some shoes when she felt lightheaded and nauseous she sat down her symptoms passed after a few minutes. The second instance [place] [employer] Essentially repeat of the same symptoms lightheadedness followed by nausea she denies any chest pain palpitations or actual vomiting. In the emergency department she was noted to be bradycardic on arrival currently she is asymptomatic.  PAST MEDICAL HISTORY:   Past Medical History  Diagnosis Date  . Coronary artery disease 11/2009    2D Echo EF>55  . Hypertension   . High cholesterol   . Reflux esophagitis   . Obesity   . Arthritis   . Sleep apnea 09/27/2010    Untreated, REM 64.7/hr AHI 18.5/hr RDI 19.2/hr. Patuent refused CPAP therapy    PAST SURGICAL HISTORY:   Past Surgical History  Procedure Laterality Date  . Cholecystectomy    . Coronary angioplasty with stent placement  2007    A 3.0x24mm CYPHER stent post dilated to 3.29 mm the 100% occlusion was reduced to 0%  . Left heart catheterization with coronary angiogram N/A 07/12/2014    Procedure: LEFT HEART CATHETERIZATION WITH CORONARY ANGIOGRAM;  Surgeon: Micheline Chapman, MD;  [facility]: [facility] LAB;  Service: Cardiovascular;  Laterality: N/A;    SOCIAL HISTORY:   Social History  Substance Use Topics  . Smoking  status: Never Smoker   . Smokeless tobacco: Never Used  . Alcohol Use: No    FAMILY HISTORY:   Family History  Problem Relation Age of Onset  . Hypertension Other     DRUG ALLERGIES:   Allergies  Allergen Reactions  . Contrast Media [Iodinated Diagnostic Agents] Nausea And Vomiting  . Codeine Nausea And Vomiting and Anxiety    REVIEW OF SYSTEMS:  REVIEW OF SYSTEMS:  CONSTITUTIONAL: Denies fevers, chills, fatigue, weakness.  EYES: Denies blurred vision, double vision, or eye pain.  EARS, NOSE, THROAT: Denies tinnitus, ear pain, hearing loss.  RESPIRATORY: denies cough, shortness of breath, wheezing  CARDIOVASCULAR: Denies chest pain, palpitations, edema.  GASTROINTESTINAL: Positive nausea, denies vomiting, diarrhea, abdominal pain.  GENITOURINARY: Denies dysuria, hematuria.  ENDOCRINE: Denies nocturia or thyroid problems. HEMATOLOGIC AND LYMPHATIC: Denies easy bruising or bleeding.  SKIN: Denies rash or lesions.  MUSCULOSKELETAL: Denies pain in neck, back, shoulder, knees, hips, or further arthritic symptoms.  NEUROLOGIC: Denies paralysis, paresthesias.  PSYCHIATRIC: Denies anxiety or depressive symptoms. Otherwise full review of systems performed by me is negative.   MEDICATIONS AT HOME:   Prior to Admission medications   Medication Sig Start Date End Date Taking? Authorizing Provider  amLODipine (NORVASC) 10 MG tablet TAKE 1 TABLET (10 MG TOTAL) BY MOUTH DAILY. 11/03/14  Yes Lennette Bihari, MD  aspirin 81 MG tablet Take 81 mg by mouth daily.   Yes Historical Provider, MD  carvedilol (COREG) 12.5 MG tablet TAKE  1 & 1/2 TABLET TWICE DAILY 03/01/15  Yes Lennette Bihari, MD  cholecalciferol (VITAMIN D) 1000 UNITS tablet Take 2,000 Units by mouth daily.   Yes Historical Provider, MD  clopidogrel (PLAVIX) 75 MG tablet Take 1 tablet (75 mg total) by mouth daily. 09/08/14  Yes Lennette Bihari, MD  diazepam (VALIUM) 5 MG tablet Take 5 mg by mouth at bedtime as needed for anxiety.    Yes Historical Provider, MD  fluticasone (FLONASE) 50 MCG/ACT nasal spray Place 1 spray into the nose as needed for allergies or rhinitis.  12/03/12  Yes Historical Provider, MD  furosemide (LASIX) 40 MG tablet Take 1 tablet (40 mg total) by mouth daily as needed for edema. 07/19/15  Yes Lennette Bihari, MD  Iron Succinyl-Protein Complex 40 MG/15ML SOLN Take 40 mg by mouth daily.   Yes Historical Provider, MD  losartan (COZAAR) 50 MG tablet Take 1.5 tablets (75 mg total) by mouth daily. Patient taking differently: Take 50 mg by mouth daily.  05/31/15  Yes Lennette Bihari, MD  NEXIUM 40 MG capsule TAKE 1 CAPSULE (40 MG TOTAL) BY MOUTH DAILY BEFORE BREAKFAST. 03/01/15  Yes Lennette Bihari, MD  nitroGLYCERIN (NITROSTAT) 0.4 MG SL tablet Place 1 tablet (0.4 mg total) under the tongue every 5 (five) minutes as needed. 06/01/15  Yes Lennette Bihari, MD  rosuvastatin (CRESTOR) 40 MG tablet Take 1 tablet (40 mg total) by mouth daily. Patient taking differently: Take 40 mg by mouth every other day.  11/26/14  Yes Lennette Bihari, MD      VITAL SIGNS:  Blood pressure 158/79, pulse 55, temperature 97.9 F (36.6 C), temperature source Oral, resp. rate 16, height 5\' 5"  (1.651 m), weight 211 lb (95.709 kg), SpO2 99 %.  PHYSICAL EXAMINATION:  VITAL SIGNS: Filed Vitals:   08/28/15 2245 08/28/15 2300  BP:    Pulse: 55   Temp:    Resp: 13 16   GENERAL:81 y.o.female currently in no acute distress.  HEAD: Normocephalic, atraumatic.  EYES: Pupils equal, round, reactive to light. Extraocular muscles intact. No scleral icterus.  MOUTH: Moist mucosal membrane. Dentition intact. No abscess noted.  EAR, NOSE, THROAT: Clear without exudates. No external lesions.  NECK: Supple. No thyromegaly. No nodules. No JVD.  PULMONARY: Clear to ascultation, without wheeze rails or rhonci. No use of accessory muscles, Good respiratory effort. good air entry bilaterally CHEST: Nontender to palpation.  CARDIOVASCULAR: S1 and S2.  Regular rate and rhythm. No murmurs, rubs, or gallops. No edema. Pedal pulses 2+ bilaterally.  GASTROINTESTINAL: Soft, nontender, nondistended. No masses. Positive bowel sounds. No hepatosplenomegaly.  MUSCULOSKELETAL: No swelling, clubbing, or edema. Range of motion full in all extremities.  NEUROLOGIC: Cranial nerves II through XII are intact. No gross focal neurological deficits. Sensation intact. Reflexes intact.  SKIN: No ulceration, lesions, rashes, or cyanosis. Skin warm and dry. Turgor intact.  PSYCHIATRIC: Mood, affect within normal limits. The patient is awake, alert and oriented x 3. Insight, judgment intact.    LABORATORY PANEL:   CBC  Recent Labs Lab 08/28/15 1939  WBC 7.2  HGB 12.4  HCT 37.5  PLT 174   ------------------------------------------------------------------------------------------------------------------  Chemistries   Recent Labs Lab 08/28/15 1939  NA 141  K 3.9  CL 111  CO2 25  GLUCOSE 130*  BUN 21*  CREATININE 1.08*  CALCIUM 8.8*   ------------------------------------------------------------------------------------------------------------------  Cardiac Enzymes  Recent Labs Lab 08/28/15 1951  TROPONINI <0.03   ------------------------------------------------------------------------------------------------------------------  RADIOLOGY:  No results found.  EKG:   Orders placed or performed during the hospital encounter of 08/28/15  . EKG 12-Lead  . EKG 12-Lead  . ED EKG  . ED EKG    IMPRESSION AND PLAN:   81 year old African-American female history of coronary artery disease, hyperlipidemia unspecified, essential hypertension who is presenting with near syncope 2.  1. Near syncope: Also transient evidence of bradycardia noted on telemetry, place on telemetry trend cardiac enzymes IV fluid hydration orthostatic vital signs avoid further AV nodal agents if heart rate is less than 60 2. Essential hypertension once again hold AV  nodal agents if heart rate less than 60 add as needed hydralazine 3. Hyperlipidemia unspecified Crestor 4. GERD without esophagitis: PPI therapy 5. Venous thromboembolism prophylactic: Heparin subcutaneous    All the records are reviewed and case discussed with ED provider. Management plans discussed with the patient, family and they are in agreement.  CODE STATUS: Full  TOTAL TIME TAKING CARE OF THIS PATIENT: 35 minutes.    Alegria Dominique,  Mardi MainlandDavid K M.D on 08/28/2015 at 11:45 PM  Between 7am to 6pm - Pager - [phone]  After 6pm: House Pager: - [phone]  Fabio NeighborsE[facility] St[facility]  Office  [phone]  CC: Primary care physician; Colette RibasGOLDING, JOHN [name], MD

## 2015-08-28 NOTE — ED Provider Notes (Addendum)
[facility] Emergency Department Provider Note  Time seen: 8:30 PM  I have reviewed the triage vital signs and the nursing notes.   HISTORY  Chief Complaint Dizziness    HPI [name] is a 81 y.o. female with a past medical history of hypertension, hyperlipidemia, gastric reflux, arthritis, who presents the emergency department with near syncope. According to the patient she was shopping when she became lightheaded and felt nauseated. Patient states she sat down for some time and felt better. Later this evening she went out to dinner and once again became nauseated and lightheaded, with reported diaphoresis per daughter. Patient states mild chest tightness but denies any chest pain at that time. States she is feeling somewhat better, was nauseated when she got to the emergency department but now is feeling better.States a history of similar symptoms in the past, but never this bad per patient. Has never passed out.     Past Medical History  Diagnosis Date  . Coronary artery disease 11/2009    2D Echo EF>55  . Hypertension   . High cholesterol   . Reflux esophagitis   . Obesity   . Arthritis   . Sleep apnea 09/27/2010    Untreated, REM 64.7/hr AHI 18.5/hr RDI 19.2/hr. Patuent refused CPAP therapy    Patient Active Problem List   Diagnosis Date Noted  . Chest pain   . Coronary artery disease involving native coronary artery of native heart without angina pectoris   . Unstable angina (HCC) 07/10/2014  . CAD (coronary artery disease) 03/05/2013  . Essential hypertension 03/05/2013  . Obesity (BMI 30-39.9) 03/05/2013  . Hyperlipidemia with target LDL less than 70 03/05/2013  . Obstructive sleep apnea 03/05/2013    Past Surgical History  Procedure Laterality Date  . Cholecystectomy    . Coronary angioplasty with stent placement  2007    A 3.0x7mm CYPHER stent post dilated to 3.29 mm the 100% occlusion was reduced to 0%  . Left heart  catheterization with coronary angiogram N/A 07/12/2014    Procedure: LEFT HEART CATHETERIZATION WITH CORONARY ANGIOGRAM;  Surgeon: Micheline Chapman, MD;  Location: [facility] CATH LAB;  Service: Cardiovascular;  Laterality: N/A;    Current Outpatient Rx  Name  Route  Sig  Dispense  Refill  . amLODipine (NORVASC) 10 MG tablet      TAKE 1 TABLET (10 MG TOTAL) BY MOUTH DAILY.   30 tablet   9   . aspirin 81 MG tablet   Oral   Take 81 mg by mouth daily.         . carvedilol (COREG) 12.5 MG tablet      TAKE 1 & 1/2 TABLET TWICE DAILY   90 tablet   6   . cholecalciferol (VITAMIN D) 1000 UNITS tablet   Oral   Take 2,000 Units by mouth daily.         . clopidogrel (PLAVIX) 75 MG tablet   Oral   Take 1 tablet (75 mg total) by mouth daily.   30 tablet   7   . diazepam (VALIUM) 5 MG tablet   Oral   Take 5 mg by mouth at bedtime as needed for anxiety.         . fluticasone (FLONASE) 50 MCG/ACT nasal spray   Nasal   Place 1 spray into the nose as needed.         . furosemide (LASIX) 40 MG tablet   Oral   Take 1 tablet (  40 mg total) by mouth daily as needed for edema.   30 tablet   1   . Iron Succinyl-Protein Complex 40 MG/15ML SOLN   Oral   Take 40 mg by mouth daily.         Marland Kitchen. losartan (COZAAR) 50 MG tablet   Oral   Take 1.5 tablets (75 mg total) by mouth daily.   45 tablet   6   . NEXIUM 40 MG capsule      TAKE 1 CAPSULE (40 MG TOTAL) BY MOUTH DAILY BEFORE BREAKFAST.   30 capsule   9   . nitroGLYCERIN (NITROSTAT) 0.4 MG SL tablet   Sublingual   Place 1 tablet (0.4 mg total) under the tongue every 5 (five) minutes as needed.   25 tablet   3   . rosuvastatin (CRESTOR) 40 MG tablet   Oral   Take 1 tablet (40 mg total) by mouth daily.   90 tablet   3     Allergies Codeine; Contrast media; and Other  History reviewed. No pertinent family history.  Social History Social History  Substance Use Topics  . Smoking status: Never Smoker   . Smokeless  tobacco: Never Used  . Alcohol Use: No    Review of Systems Constitutional: Negative for fever. Positive for lightheadedness. Cardiovascular: Negative for chest pain. Respiratory: Negative for shortness of breath. Gastrointestinal: Negative for abdominal pain Genitourinary: Negative for dysuria. Musculoskeletal: Negative for back pain. Skin: Negative for rash. Neurological: Negative for headache 10-point ROS otherwise negative.  ____________________________________________   PHYSICAL EXAM:  VITAL SIGNS: ED Triage Vitals  Enc Vitals Group     BP 08/28/15 1930 168/75 mmHg     Pulse Rate 08/28/15 1930 65     Resp 08/28/15 1930 18     Temp 08/28/15 1930 97.9 F (36.6 C)     Temp Source 08/28/15 1930 Oral     SpO2 08/28/15 1930 99 %     Weight 08/28/15 1930 211 lb (95.709 kg)     Height 08/28/15 1930 5\' 5"  (1.651 m)     Head Cir --      Peak Flow --      Pain Score --      Pain Loc --      Pain Edu? --      Excl. in GC? --     Constitutional: Alert and oriented. Well appearing and in no distress. Eyes: Normal exam ENT   Head: Normocephalic and atraumatic.   Mouth/Throat: Mucous membranes are moist. Cardiovascular: Regular rhythm, rate around 50 bpm. Respiratory: Normal respiratory effort without tachypnea nor retractions. Breath sounds are clear Gastrointestinal: Soft and nontender. No distention.   Musculoskeletal: Nontender with normal range of motion in all extremities.  Neurologic:  Normal speech and language. No gross focal neurologic deficits  Skin:  Skin is warm, dry and intact.  Psychiatric: Mood and affect are normal. Speech and behavior are normal.  ____________________________________________    EKG  EKG reviewed and interpreted by myself shows normal sinus rhythm at 50 bpm, narrow QRS, normal axis, normal intervals, somewhat prolonged QT, no concerning ST changes noted.  ____________________________________________   INITIAL IMPRESSION /  ASSESSMENT AND PLAN / ED COURSE  Pertinent labs & imaging results that were available during my care of the patient were reviewed by me and considered in my medical decision making (see chart for details).  Patient presents to the emergency department after a near syncopal episode. Patient states she is feeling better  but still somewhat nauseated and lightheaded currently. Patient has an overall normal exam however it is noted that the patient has bradycardia currently. Heart rate is maintained 45-55 bpm, as he did drop as low as 43 bpm. In reviewing the patient's old records it appears the patient typically runs between 65-75 bpm. we will closely monitor the patient on telemetry, check labs, dose IV fluids.  Labs are within normal limits our patient continues to feel nauseated at times, heart rate remains in the upper 40s to low 50s which appears to be abnormal for the patient. We'll admit the patient for monitoring overnight. ____________________________________________   FINAL CLINICAL IMPRESSION(S) / ED DIAGNOSES  Near-syncope Symptomatic bradycardia  Minna Antis, MD 08/28/15 2200  Minna Antis, MD 09/08/15 2157

## 2015-08-28 NOTE — ED Notes (Signed)
Pt was shopping with dtr and became lightheaded and diaphoretic. Believed it was from being overheated. [place] [employer] and became [place]ated as well so call ems

## 2015-08-29 ENCOUNTER — Observation Stay (HOSPITAL_BASED_OUTPATIENT_CLINIC_OR_DEPARTMENT_OTHER)
Admit: 2015-08-29 | Discharge: 2015-08-29 | Disposition: A | Discharge status: Home / Self Care | Payer: Medicare Other | Attending: Physician Assistant | Admitting: Physician Assistant

## 2015-08-29 ENCOUNTER — Other Ambulatory Visit: Payer: Self-pay | Admitting: Cardiovascular Disease

## 2015-08-29 DIAGNOSIS — I10 Essential (primary) hypertension: Secondary | ICD-10-CM | POA: Diagnosis not present

## 2015-08-29 DIAGNOSIS — K219 Gastro-esophageal reflux disease without esophagitis: Secondary | ICD-10-CM | POA: Diagnosis not present

## 2015-08-29 DIAGNOSIS — I2510 Atherosclerotic heart disease of native coronary artery without angina pectoris: Secondary | ICD-10-CM

## 2015-08-29 DIAGNOSIS — R001 Bradycardia, unspecified: Secondary | ICD-10-CM | POA: Diagnosis not present

## 2015-08-29 DIAGNOSIS — E785 Hyperlipidemia, unspecified: Secondary | ICD-10-CM | POA: Diagnosis not present

## 2015-08-29 DIAGNOSIS — R55 Syncope and collapse: Secondary | ICD-10-CM

## 2015-08-29 DIAGNOSIS — I951 Orthostatic hypotension: Secondary | ICD-10-CM | POA: Diagnosis not present

## 2015-08-29 DIAGNOSIS — E782 Mixed hyperlipidemia: Secondary | ICD-10-CM | POA: Insufficient documentation

## 2015-08-29 DIAGNOSIS — R1906 Epigastric swelling, mass or lump: Secondary | ICD-10-CM | POA: Insufficient documentation

## 2015-08-29 LAB — CBC
HCT: 36.1 % (ref 35.0–47.0)
Hemoglobin: 11.7 g/dL — ABNORMAL LOW (ref 12.0–16.0)
MCH: 27.5 pg (ref 26.0–34.0)
MCHC: 32.5 g/dL (ref 32.0–36.0)
MCV: 84.7 fL (ref 80.0–100.0)
Platelets: 152 10*3/uL (ref 150–440)
RBC: 4.26 MIL/uL (ref 3.80–5.20)
RDW: 16.3 % — ABNORMAL HIGH (ref 11.5–14.5)
WBC: 7 10*3/uL (ref 3.6–11.0)

## 2015-08-29 LAB — BASIC METABOLIC PANEL
Anion gap: 4 — ABNORMAL LOW (ref 5–15)
BUN: 17 mg/dL (ref 6–20)
CO2: 22 mmol/L (ref 22–32)
Calcium: 8.4 mg/dL — ABNORMAL LOW (ref 8.9–10.3)
Chloride: 114 mmol/L — ABNORMAL HIGH (ref 101–111)
Creatinine, Ser: 0.91 mg/dL (ref 0.44–1.00)
GFR calc Af Amer: >60 mL/min (ref >60)
GFR calc non Af Amer: 58 mL/min — ABNORMAL LOW (ref >60)
Glucose, Bld: 140 mg/dL — ABNORMAL HIGH (ref 65–99)
Potassium: 3.8 mmol/L (ref 3.5–5.1)
Sodium: 140 mmol/L (ref 135–145)

## 2015-08-29 LAB — TROPONIN I
Troponin I: <0.03 ng/mL (ref <0.031)
Troponin I: <0.03 ng/mL (ref <0.031)
Troponin I: <0.03 ng/mL (ref <0.031)
Troponin I: 0.04 ng/mL — ABNORMAL HIGH (ref <0.031)

## 2015-08-29 LAB — TSH: TSH: 1.437 u[IU]/mL (ref 0.350–4.500)

## 2015-08-29 MED ORDER — POLYSACCHARIDE IRON COMPLEX 150 MG PO CAPS
150.0000 mg | ORAL_CAPSULE | Freq: Every day | ORAL | Status: DC
  Administered 2015-09-01: 150 mg via ORAL
  Filled 2015-08-29 (×4): qty 1

## 2015-08-29 MED ORDER — ROSUVASTATIN CALCIUM 20 MG PO TABS
40.0000 mg | ORAL_TABLET | ORAL | Status: DC
  Administered 2015-08-29 – 2015-08-31 (×2): 40 mg via ORAL
  Filled 2015-08-29 (×3): qty 2

## 2015-08-29 MED ORDER — MAGNESIUM HYDROXIDE 400 MG/5ML PO SUSP
30.0000 mL | Freq: Two times a day (BID) | ORAL | Status: AC
  Administered 2015-08-29: 30 mL via ORAL
  Filled 2015-08-29 (×2): qty 30

## 2015-08-29 MED ORDER — LOSARTAN POTASSIUM 50 MG PO TABS
50.0000 mg | ORAL_TABLET | Freq: Every day | ORAL | Status: DC
  Administered 2015-08-29 – 2015-08-30 (×2): 50 mg via ORAL
  Filled 2015-08-29 (×2): qty 1

## 2015-08-29 MED ORDER — VITAMIN D 1000 UNITS PO TABS
2000.0000 [IU] | ORAL_TABLET | Freq: Every day | ORAL | Status: DC
Start: 2015-08-29 — End: 2015-09-02
  Administered 2015-08-29 – 2015-09-02 (×5): 2000 [IU] via ORAL
  Filled 2015-08-29 (×5): qty 2

## 2015-08-29 MED ORDER — FLUTICASONE PROPIONATE 50 MCG/ACT NA SUSP
1.0000 | Freq: Every day | NASAL | Status: DC
  Administered 2015-08-30 – 2015-09-02 (×4): 1 via NASAL
  Filled 2015-08-29: qty 16

## 2015-08-29 MED ORDER — NITROGLYCERIN 0.4 MG SL SUBL: 0.4000 mg | SUBLINGUAL_TABLET | SUBLINGUAL | Status: DC | PRN

## 2015-08-29 MED ORDER — FAMOTIDINE 20 MG PO TABS
20.0000 mg | ORAL_TABLET | Freq: Two times a day (BID) | ORAL | Status: DC
  Administered 2015-08-29 – 2015-08-30 (×2): 20 mg via ORAL
  Filled 2015-08-29 (×2): qty 1

## 2015-08-29 MED ORDER — AMLODIPINE BESYLATE 5 MG PO TABS
5.0000 mg | ORAL_TABLET | Freq: Every day | ORAL | Status: DC
  Administered 2015-08-30 – 2015-09-01 (×3): 5 mg via ORAL
  Filled 2015-08-29 (×3): qty 1

## 2015-08-29 MED ORDER — CLOPIDOGREL BISULFATE 75 MG PO TABS
75.0000 mg | ORAL_TABLET | Freq: Every day | ORAL | Status: DC
  Administered 2015-08-29 – 2015-09-02 (×5): 75 mg via ORAL
  Filled 2015-08-29 (×5): qty 1

## 2015-08-29 MED ORDER — IRON SUCCINYL-PROTEIN COMPLEX 40 MG/15ML PO SOLN: 40.0000 mg | Freq: Every day | ORAL | Status: DC

## 2015-08-29 MED ORDER — PANTOPRAZOLE SODIUM 40 MG PO TBEC
40.0000 mg | DELAYED_RELEASE_TABLET | Freq: Every day | ORAL | Status: DC
  Administered 2015-08-29 – 2015-09-02 (×5): 40 mg via ORAL
  Filled 2015-08-29 (×5): qty 1

## 2015-08-29 MED ORDER — ENOXAPARIN SODIUM 40 MG/0.4ML ~~LOC~~ SOLN
40.0000 mg | SUBCUTANEOUS | Status: DC
  Administered 2015-08-29 – 2015-09-01 (×4): 40 mg via SUBCUTANEOUS
  Filled 2015-08-29 (×4): qty 0.4

## 2015-08-29 MED ORDER — HYDRALAZINE HCL 20 MG/ML IJ SOLN
5.0000 mg | INTRAMUSCULAR | Status: DC | PRN
  Administered 2015-08-30: 5 mg via INTRAVENOUS
  Filled 2015-08-29: qty 1

## 2015-08-29 MED ORDER — ASPIRIN 81 MG PO CHEW
81.0000 mg | CHEWABLE_TABLET | Freq: Every day | ORAL | Status: DC
  Administered 2015-08-29 – 2015-09-02 (×5): 81 mg via ORAL
  Filled 2015-08-29 (×5): qty 1

## 2015-08-29 MED ORDER — DIAZEPAM 5 MG PO TABS: 5.0000 mg | ORAL_TABLET | Freq: Every evening | ORAL | Status: DC | PRN

## 2015-08-29 NOTE — Progress Notes (Signed)
Ryan,PA made aware of 0.04 troponin - continue to monitor

## 2015-08-29 NOTE — Progress Notes (Signed)
*  PRELIMINARY RESULTS* Echocardiogram 2D Echocardiogram has been performed.  Garrel Ridgelikeshia S Stills 08/29/2015, 4:45 PM

## 2015-08-29 NOTE — Consult Note (Signed)
Cardiology Consultation Note  Patient ID: [name], MRN: [id number], DOB/AGE: 79/05/1934 79 y.o. Admit date: 08/28/2015   Date of Consult: 08/29/2015 Primary Physician: Colette RibasGOLDING, JOHN [name], MD Primary Cardiologist: Dr. Tresa EndoKelly, MD  Chief Complaint: Near syncope Reason for Consult: Near syncope  HPI: 81 y.o. female with h/o CAD s/p PCI to totally occluded ramus in 2007 with concomitant 60% distal LCx and mid LAD myocardial muscle bridging along with history of remote stenting to RCA. She also has history of HTN, HLD, obesity, untreated sleep apnea, and relux. [facility] with near syncope.   A stress test in December 2012 showed a mild to moderate defect in the basal anterolateral region without significant ischemia with breast attenuation. A followup nuclear perfusion study on 01/20/2014 remainedlow risk and showed mild to moderate basal, mid, anterolateral scar without reversible ischemia, unchanged from her prior study. In June 2015, her amlodipine was titrated for more optimal blood pressure control. On 07/10/2014, she was admitted to the hospital with chest pressure. Cardiac enzymes were cycled and were negative x 3. She underwent cardiac catheterization which with showed patent stents in the ramus and RCA.Ejection fraction was 75%.She was started on carvedilol 12.5 mg twice a day and continued on aspirin, Plavix, Crestor 20 mg, and amlodipine 10 mg. She has had issues with bilateral knee pain 2/2 arthritis. At her last follow up with Dr. Tresa EndoKelly on 05/31/2015 her losartan was titrated to 75 mg daily, amlodipine was 10 mg daily, Coreg was 18.75 mg bid. She has since had issues with bilateral lower extremity swelling and has gone on Lasix 40 mg daily prn swelling.   [facility] after suffering 2 near syncopal episodes on 12/18. [place] and the other while [place] [employer]. There was no LOC, loss of bowel or bladder function. She reports  a sensation of nausea and needing to vomit but cannot. This feeling gets worse s/p taking amlodipine, thus she has not taken it for the past one week. Her BP at home has been running in the 130's to 150's systolic. She has had one day with a systolic BP of 115. She does not recall what her pulse rate is at home. She reports vomiting was the symptom of her MI in 2007, not chest pain.     [facility] they were found to have troponin negative x 3, Ca 8.4, Cl 114, SCr 0.91, K+ 3.8, hgb 11.7. ECG showed sinus bradycardia, 50 bpm, bifascicular block, poor R wave progression, CXR was not performed. Since holding beta blocker her heart rate has improved into the mid 60's. She continues to note nausea without emesis. No chest pain.     Past Medical History  Diagnosis Date  . Coronary artery disease 11/2009    2D Echo EF>55  . Hypertension   . High cholesterol   . Reflux esophagitis   . Obesity   . Arthritis   . Sleep apnea 09/27/2010    Untreated, REM 64.7/hr AHI 18.5/hr RDI 19.2/hr. Patuent refused CPAP therapy      Most Recent Cardiac Studies: Echo 07/11/2014: Study Conclusions  - Left ventricle: The cavity size was normal. There was moderate concentric hypertrophy. Systolic function was hyperdynamic. The estimated ejection fraction was in the range of 70% to 75%. There was dynamic obstruction at restin the mid cavity, with a peak velocity of 200 cm/sec and a peak gradient of 16 mm Hg. No LV  outflow obstruction is seen. Wall motion was normal; there were no regional wall motion abnormalities. Doppler parameters are consistent with abnormal left ventricular relaxation (grade 1 diastolic dysfunction). Indeterminate mean left atrial pressure by Doppler parameters. - Aortic valve: There was mild regurgitation. - Left atrium: The atrium was mildly dilated. - Pericardium, extracardiac: A trivial pericardial effusion was identified.  Myoview  01/2014: Impression Exercise Capacity: Lexiscan with no exercise. BP Response: Normal blood pressure response. Clinical Symptoms: No significant symptoms noted. ECG Impression: No significant ECG changes with Lexiscan. Comparison with Prior Nuclear Study: No significant change from previous study  Overall Impression: Low risk stress nuclear study with a mild to moderate basal-mid anterolateral scar. There is no reversible ischemia.  Cardiac cath 07/2014: Coronary angiography: Coronary dominance: right  Left mainstem: Widely patent with nonobstructive irregularity and mild calcification  Left anterior descending (LAD): Prox vessel is widely patent. There is a large first diagonal without stenosis. The mid-vessel beyond the diagonal has an intramyocardial bridge. There is no obstructive disease in the LAD distribution.  Left circumflex (LCx): The ramus branch is patent with a widely patent stent. The circumflex is a dominant vessel with patent OM branches and a stable 50% stenosis in the distal AV circumflex before the left PDA. No change compared with the 2007 study.  Right coronary artery (RCA): Moderate caliber, nondominant vessel with a patent stent in the mid-vessel.  Left ventriculography: deferred. LVEF 75% by echo  Estimated Blood Loss: minimal  Final Conclusions:  1. Double vessel CAD with patent stents in the ramus and RCA 2. Widely patent LAD 3. Normal LV function by echo  Recommendations: continue medical therapy for CAD      Surgical History:  Past Surgical History  Procedure Laterality Date  . Cholecystectomy    . Coronary angioplasty with stent placement  2007    A 3.0x74mm CYPHER stent post dilated to 3.29 mm the 100% occlusion was reduced to 0%  . Left heart catheterization with coronary angiogram N/A 07/12/2014    Procedure: LEFT HEART CATHETERIZATION WITH CORONARY ANGIOGRAM;  Surgeon: Micheline Chapman, MD;  Location: [facility] CATH LAB;  Service: Cardiovascular;   Laterality: N/A;     Home Meds: Prior to Admission medications   Medication Sig Start Date End Date Taking? Authorizing Provider  amLODipine (NORVASC) 10 MG tablet TAKE 1 TABLET (10 MG TOTAL) BY MOUTH DAILY. 11/03/14  Yes Lennette Bihari, MD  aspirin 81 MG tablet Take 81 mg by mouth daily.   Yes Historical Provider, MD  carvedilol (COREG) 12.5 MG tablet TAKE 1 & 1/2 TABLET TWICE DAILY 03/01/15  Yes Lennette Bihari, MD  cholecalciferol (VITAMIN D) 1000 UNITS tablet Take 2,000 Units by mouth daily.   Yes Historical Provider, MD  clopidogrel (PLAVIX) 75 MG tablet Take 1 tablet (75 mg total) by mouth daily. 09/08/14  Yes Lennette Bihari, MD  diazepam (VALIUM) 5 MG tablet Take 5 mg by mouth at bedtime as needed for anxiety.   Yes Historical Provider, MD  fluticasone (FLONASE) 50 MCG/ACT nasal spray Place 1 spray into the nose as needed for allergies or rhinitis.  12/03/12  Yes Historical Provider, MD  furosemide (LASIX) 40 MG tablet Take 1 tablet (40 mg total) by mouth daily as needed for edema. 07/19/15  Yes Lennette Bihari, MD  Iron Succinyl-Protein Complex 40 MG/15ML SOLN Take 40 mg by mouth daily.   Yes Historical Provider, MD  losartan (COZAAR) 50 MG tablet Take 1.5 tablets (75 mg total) by  mouth daily. Patient taking differently: Take 50 mg by mouth daily.  05/31/15  Yes Lennette Bihari, MD  NEXIUM 40 MG capsule TAKE 1 CAPSULE (40 MG TOTAL) BY MOUTH DAILY BEFORE BREAKFAST. 03/01/15  Yes Lennette Bihari, MD  nitroGLYCERIN (NITROSTAT) 0.4 MG SL tablet Place 1 tablet (0.4 mg total) under the tongue every 5 (five) minutes as needed. 06/01/15  Yes Lennette Bihari, MD  rosuvastatin (CRESTOR) 40 MG tablet Take 1 tablet (40 mg total) by mouth daily. Patient taking differently: Take 40 mg by mouth every other day.  11/26/14  Yes Lennette Bihari, MD    Inpatient Medications:  . aspirin  81 mg Oral Daily  . cholecalciferol  2,000 Units Oral Daily  . clopidogrel  75 mg Oral Daily  . fluticasone  1 spray Each Nare  Daily  . heparin  5,000 Units Subcutaneous 3 times per day  . iron polysaccharides  150 mg Oral Daily  . losartan  50 mg Oral Daily  . pantoprazole  40 mg Oral Daily  . rosuvastatin  40 mg Oral QODAY  . sodium chloride  3 mL Intravenous Q12H   . sodium chloride 100 mL/hr at 08/29/15 0047    Allergies:  Allergies  Allergen Reactions  . Contrast Media [Iodinated Diagnostic Agents] Nausea And Vomiting  . Codeine Nausea And Vomiting and Anxiety    Social History   Social History  . Marital Status: Single    Spouse Name: N/A  . Number of Children: N/A  . Years of Education: N/A   Occupational History  . Not on file.   Social History Main Topics  . Smoking status: Never Smoker   . Smokeless tobacco: Never Used  . Alcohol Use: No  . Drug Use: No  . Sexual Activity: Not on file   Other Topics Concern  . Not on file   Social History Narrative     Family History  Problem Relation Age of Onset  . Hypertension Other      Review of Systems: Review of Systems  Constitutional: Positive for malaise/fatigue. Negative for fever, chills, weight loss and diaphoresis.  HENT: Negative for congestion.   Eyes: Negative for discharge and redness.  Respiratory: Positive for cough and shortness of breath. Negative for hemoptysis, sputum production and wheezing.   Cardiovascular: Negative for chest pain, palpitations, orthopnea, claudication, leg swelling and PND.  Gastrointestinal: Positive for nausea. Negative for heartburn, vomiting, abdominal pain, diarrhea, constipation, blood in stool and melena.  Genitourinary: Negative for hematuria.  Musculoskeletal: Negative for myalgias and falls.  Skin: Negative for rash.  Neurological: Positive for dizziness and weakness. Negative for sensory change, speech change, focal weakness, seizures and loss of consciousness.  Endo/Heme/Allergies: Does not bruise/bleed easily.  Psychiatric/Behavioral: The patient is not nervous/anxious.   All  other systems reviewed and are negative.   Labs:  Recent Labs  08/28/15 1951 08/29/15 0132 08/29/15 0654 08/29/15 0903  TROPONINI <0.03 <0.03 <0.03 <0.03   Lab Results  Component Value Date   WBC 7.0 08/29/2015   HGB 11.7* 08/29/2015   HCT 36.1 08/29/2015   MCV 84.7 08/29/2015   PLT 152 08/29/2015    Recent Labs Lab 08/29/15 0132  NA 140  K 3.8  CL 114*  CO2 22  BUN 17  CREATININE 0.91  CALCIUM 8.4*  GLUCOSE 140*   Lab Results  Component Value Date   CHOL 146 01/19/2015   HDL 48 01/19/2015   LDLCALC 77 01/19/2015   TRIG 104  01/19/2015   Lab Results  Component Value Date   DDIMER 0.48 12/01/2011    Radiology/Studies:  No results found.  EKG: sinus bradycardia, 50 bpm, bifascicular block, poor R wave progression  Weights: Filed Weights   08/28/15 1930 08/29/15 0105  Weight: 211 lb (95.709 kg) 209 lb 6.4 oz (94.983 kg)     Physical Exam: Blood pressure 149/48, pulse 65, temperature 97.9 F (36.6 C), temperature source Oral, resp. rate 18, height 5\' 5"  (1.651 m), weight 209 lb 6.4 oz (94.983 kg), SpO2 98 %. Body mass index is 34.85 kg/(m^2). General: Well developed, well nourished, in no acute distress. Head: Normocephalic, atraumatic, sclera non-icteric, no xanthomas, nares are without discharge.  Neck: Negative for carotid bruits. JVD not elevated. Lungs: Clear bilaterally to auscultation without wheezes, rales, or rhonchi. Breathing is unlabored. Heart: RRR with S1 S2. No murmurs, rubs, or gallops appreciated. Abdomen: Obese, soft, non-tender, non-distended with normoactive bowel sounds. No hepatomegaly. No rebound/guarding. No obvious abdominal masses. Msk:  Strength and tone appear normal for age. Extremities: No clubbing or cyanosis. No edema.  Distal pedal pulses are 2+ and equal bilaterally. Neuro: Alert and oriented X 3. No facial asymmetry. No focal deficit. Moves all extremities spontaneously. Psych:  Responds to questions appropriately with  a normal affect.    Assessment and Plan:   1. Near syncope/bradycardia: -Bradycardia improved with holding of Coreg -Continue to monitor on telemetry for significant arrhythmia or pause -Check echo to evaluate LV function and wall motion -She reports nausea/vomiting as the symptom during her MI in 2007. Recent cardiac cath 07/2014 unchanged from prior with medical management being recommended -If echo is normal could pursue nuclear stress testing to evaluate for high risk ischemia, possibly as an outpatient with primary cardiologist, if echo is abnormal may need repeat cardiac cath -Plan for 30 day event monitor to evaluate for arrhythmia or pauses -Heart rate has demonstrated appropriate chronotropic response with holding beta blocker   2. CAD s/p PCI as above: -No angina -Continue aspirin and Plavix -After beta blocker wash out plan to restart Coreg at a lower dose -Crestor 40 mg   3. HTN: -Improving -Losartan 50 mg daily, hydralazine prn -She prefers to not take amlodipine at this time  4. HLD: -Crestor as above   Elinor Dodge, PA-C Pager: [phone] 08/29/2015, 1:33 PM

## 2015-08-29 NOTE — Progress Notes (Signed)
[facility] - [place] at [facility]   PATIENT NAME: [name]    MR#:  [id number]  DATE OF BIRTH:  07/19/1934  SUBJECTIVE:  CHIEF COMPLAINT:  Patient is not feeling good reports epigastric discomfort and feeling dizzy. Patient and daughter were worried about her fluctuating blood pressure which is an ongoing problem. Daughter is asking for 1 single pill which could fix her fluctuating blood pressure. Patient denies any chest pain had an episode of bradycardia,  REVIEW OF SYSTEMS:  CONSTITUTIONAL: No fever, fatigue or weakness.  EYES: No blurred or double vision.  EARS, NOSE, AND THROAT: No tinnitus or ear pain.  RESPIRATORY: No cough, shortness of breath, wheezing or hemoptysis.  CARDIOVASCULAR: No chest pain, orthopnea, edema. Reporting dizziness  GASTROINTESTINAL:Reporting epigastric discomfort  No nausea, vomiting, diarrhea or abdominal pain.  GENITOURINARY: No dysuria, hematuria.  ENDOCRINE: No polyuria, nocturia,  HEMATOLOGY: No anemia, easy bruising or bleeding SKIN: No rash or lesion. MUSCULOSKELETAL: No joint pain or arthritis.   NEUROLOGIC: No tingling, numbness, weakness.  PSYCHIATRY: No anxiety or depression.   DRUG ALLERGIES:   Allergies  Allergen Reactions  . Contrast Media [Iodinated Diagnostic Agents] Nausea And Vomiting  . Codeine Nausea And Vomiting and Anxiety    VITALS:  Blood pressure 149/48, pulse 65, temperature 97.9 F (36.6 C), temperature source Oral, resp. rate 18, height 5\' 5"  (1.651 m), weight 94.983 kg (209 lb 6.4 oz), SpO2 98 %.  PHYSICAL EXAMINATION:  GENERAL:  81 y.o.-year-old patient lying in the bed with no acute distress.  EYES: Pupils equal, round, reactive to light and accommodation. No scleral icterus. Extraocular muscles intact.  HEENT: Head atraumatic, normocephalic. Oropharynx and nasopharynx clear.  NECK:  Supple, no jugular venous distention. No thyroid enlargement, no tenderness.  LUNGS: Normal breath  sounds bilaterally, no wheezing, rales,rhonchi or crepitation. No use of accessory muscles of respiration.  CARDIOVASCULAR: S1, S2 normal. No murmurs, rubs, or gallops.  ABDOMEN: Soft, positive epigastric tenderness no rebound tenderness, nondistended. Bowel sounds present. No organomegaly or mass. Obese  EXTREMITIES: No pedal edema, cyanosis, or clubbing.  NEUROLOGIC: Cranial nerves II through XII are intact. Muscle strength 5/5 in all extremities. Sensation intact. Gait not checked.  PSYCHIATRIC: The patient is alert and oriented x 3.  SKIN: No obvious rash, lesion, or ulcer.    LABORATORY PANEL:   CBC  Recent Labs Lab 08/29/15 0132  WBC 7.0  HGB 11.7*  HCT 36.1  PLT 152   ------------------------------------------------------------------------------------------------------------------  Chemistries   Recent Labs Lab 08/29/15 0132  NA 140  K 3.8  CL 114*  CO2 22  GLUCOSE 140*  BUN 17  CREATININE 0.91  CALCIUM 8.4*   ------------------------------------------------------------------------------------------------------------------  Cardiac Enzymes  Recent Labs Lab 08/29/15 1254  TROPONINI 0.04*   ------------------------------------------------------------------------------------------------------------------  RADIOLOGY:  No results found.  EKG:   Orders placed or performed during the hospital encounter of 08/28/15  . EKG 12-Lead  . EKG 12-Lead  . ED EKG  . ED EKG    ASSESSMENT AND PLAN:   81 year old African-American female history of coronary artery disease, hyperlipidemia unspecified, essential hypertension who is presenting with near syncope 2.  #. Near syncope/bradycardia: Ruled out acute MI with 3 negative sets of cardiac enzymes -Bradycardia improved with holding of Coreg -Continue to monitor on telemetry , outpatient event monitor might be helpful -Check echo to evaluate LV function and wall motion -Recent cardiac cath 07/2014 unchanged  from prior with medical management being recommended  #.  Essential hypertension-uncontrolled -Patient  felt dizzy after giving IV hydralazine 1 -Feels comfortable to continue Losartan 50 mg daily -Coreg is on hold in view of bradycardia -She prefers to not take amlodipine at this time in view of  lightheadedness in the past   #CAD s/p PCI as above: -Continue aspirin and Plavix -Currently Coreg is on hold, cardiology is recommending to restart Coreg at a lower dose later -Crestor 40 mg  Appreciate cardiology recommendations    #hyperlipidemia continue Crestor     All the records are reviewed and case discussed with Care Management/Social Workerr. Management plans discussed with the patient, family and they are in agreement.  CODE STATUS: FC  TOTAL TIME TAKING CARE OF THIS PATIENT: 35  minutes.   POSSIBLE D/C IN 2-3 DAYS, DEPENDING ON CLINICAL CONDITION.   Ramonita Lab M.D on 08/29/2015 at 4:56 PM  Between 7am to 6pm - Pager - [phone] After 6pm go to [email or web address] - password [email or web address] [facility]  [facility]  Office  [phone]  CC: Primary care physician; Colette Ribas, MD

## 2015-08-29 NOTE — Progress Notes (Signed)
Pt. Arrived to unit via stretcher. Pt. Transferred self from stretcher to bed without staff assistance. VSS, Pt. A&O. No c/o pain or acute distress noted. Tele applied, general room orientation given. Instruction on how to use call bell and ascom system given. Pt. Verbally stated understanding. Tele box verified, 2nd nurse verifying, CK, RN. Skin is warm and dry with no issues noted. Verifying nurse for skin assessment CK, RN

## 2015-08-30 DIAGNOSIS — R001 Bradycardia, unspecified: Secondary | ICD-10-CM | POA: Diagnosis not present

## 2015-08-30 DIAGNOSIS — R55 Syncope and collapse: Secondary | ICD-10-CM | POA: Diagnosis not present

## 2015-08-30 DIAGNOSIS — I951 Orthostatic hypotension: Secondary | ICD-10-CM | POA: Diagnosis not present

## 2015-08-30 DIAGNOSIS — E785 Hyperlipidemia, unspecified: Secondary | ICD-10-CM | POA: Diagnosis not present

## 2015-08-30 DIAGNOSIS — I10 Essential (primary) hypertension: Secondary | ICD-10-CM | POA: Insufficient documentation

## 2015-08-30 DIAGNOSIS — R1906 Epigastric swelling, mass or lump: Secondary | ICD-10-CM | POA: Diagnosis not present

## 2015-08-30 DIAGNOSIS — K219 Gastro-esophageal reflux disease without esophagitis: Secondary | ICD-10-CM | POA: Diagnosis not present

## 2015-08-30 MED ORDER — HYDRALAZINE HCL 50 MG PO TABS
50.0000 mg | ORAL_TABLET | Freq: Four times a day (QID) | ORAL | Status: DC
  Administered 2015-08-31 (×2): 50 mg via ORAL
  Filled 2015-08-30 (×2): qty 1

## 2015-08-30 MED ORDER — CARVEDILOL 6.25 MG PO TABS
6.2500 mg | ORAL_TABLET | Freq: Two times a day (BID) | ORAL | Status: DC
  Administered 2015-08-30 – 2015-09-02 (×7): 6.25 mg via ORAL
  Filled 2015-08-30 (×7): qty 1

## 2015-08-30 MED ORDER — LOSARTAN POTASSIUM 50 MG PO TABS
50.0000 mg | ORAL_TABLET | Freq: Once | ORAL | Status: AC
  Administered 2015-08-30: 50 mg via ORAL
  Filled 2015-08-30: qty 1

## 2015-08-30 MED ORDER — LOSARTAN POTASSIUM 50 MG PO TABS
100.0000 mg | ORAL_TABLET | Freq: Every day | ORAL | Status: DC
  Administered 2015-08-31 – 2015-09-02 (×3): 100 mg via ORAL
  Filled 2015-08-30 (×3): qty 2

## 2015-08-30 MED ORDER — HYDRALAZINE HCL 25 MG PO TABS: 25.0000 mg | ORAL_TABLET | Freq: Four times a day (QID) | ORAL | Status: DC | PRN

## 2015-08-30 NOTE — Progress Notes (Signed)
Patient BP decreased, stable at this time. [name]

## 2015-08-30 NOTE — Progress Notes (Signed)
Pt's BP elevated this morning at 232/84, pt has normal saline infusing at 100, & has PRN hydralazine, PRN hydralazine given. MD paged. MD to D/C fluids & gave orders to give morning BP medication early (losartan). No other new orders. Will continue to monitor & recheck BP. Shirley FriarAlexis Miller, RN

## 2015-08-30 NOTE — Progress Notes (Signed)
Patient: [name] / Admit Date: 08/28/2015 / Date of Encounter: 08/30/2015, 8:30 PM   Subjective: Reports her abdomen is better after bowel movement Daughter and patient concerned about urinary tract infection, denies having any symptoms She wonders why her blood pressures running so high  Review of Systems: Review of Systems  Constitutional: Negative.   Respiratory: Negative.   Cardiovascular: Negative.   Gastrointestinal: Negative.   Musculoskeletal: Negative.   Neurological: Negative.   Psychiatric/Behavioral: Negative.   All other systems reviewed and are negative.    Objective: Telemetry: NSR Physical Exam: Blood pressure 188/65, pulse 64, temperature 98.4 F (36.9 C), temperature source Oral, resp. rate 18, height  (1.651 m), weight 209 lb 6.4 oz (94.983 kg), SpO2 99 %. Body mass index is 34.85 kg/(m^2). General: Well developed, well nourished, in no acute distress.Obese Head: Normocephalic, atraumatic, sclera non-icteric, no xanthomas, nares are without discharge. Neck: Negative for carotid bruits. JVP not elevated. Lungs: Clear bilaterally to auscultation without wheezes, rales, or rhonchi. Breathing is unlabored. Heart: RRR S1 S2 without murmurs, rubs, or gallops.  Abdomen: Soft, non-tender, non-distended with normoactive bowel sounds. No rebound/guarding. Extremities: No clubbing or cyanosis. No edema. Distal pedal pulses are 2+ and equal bilaterally. Neuro: Alert and oriented X 3. Moves all extremities spontaneously. Psych:  Responds to questions appropriately with a normal affect.   Intake/Output Summary (Last 24 hours) at 08/30/15 2030 Last data filed at 08/30/15 1922  Gross per 24 hour  Intake   1080 ml  Output   2050 ml  Net   -970 ml    Inpatient Medications:  . amLODipine  5 mg Oral Daily  . aspirin  81 mg Oral Daily  . carvedilol  6.25 mg Oral BID WC  . cholecalciferol  2,000 Units Oral Daily  . clopidogrel  75 mg Oral Daily  .  enoxaparin (LOVENOX) injection  40 mg Subcutaneous Q24H  . fluticasone  1 spray Each Nare Daily  . iron polysaccharides  150 mg Oral Daily  . [START ON 08/31/2015] losartan  100 mg Oral Daily  . magnesium hydroxide  30 mL Oral BID  . pantoprazole  40 mg Oral Daily  . rosuvastatin  40 mg Oral QODAY  . sodium chloride  3 mL Intravenous Q12H   Infusions:    Labs:  Recent Labs  08/28/15 1939 08/29/15 0132  NA 141 140  K 3.9 3.8  CL 111 114*  CO2 25 22  GLUCOSE 130* 140*  BUN 21* 17  CREATININE 1.08* 0.91  CALCIUM 8.8* 8.4*   No results for input(s): AST, ALT, ALKPHOS, BILITOT, PROT, ALBUMIN in the last 72 hours.  Recent Labs  08/28/15 1939 08/29/15 0132  WBC 7.2 7.0  HGB 12.4 11.7*  HCT 37.5 36.1  MCV 85.3 84.7  PLT 174 152    Recent Labs  08/29/15 0132 08/29/15 0654 08/29/15 0903 08/29/15 1254  TROPONINI <0.03 <0.03 <0.03 0.04*   Invalid input(s): POCBNP No results for input(s): HGBA1C in the last 72 hours.   Weights: Filed Weights   08/28/15 1930 08/29/15 0105  Weight: 211 lb (95.709 kg) 209 lb 6.4 oz (94.983 kg)     Radiology/Studies:  No results found.   Assessment and Plan  81 y.o. female   1. Near syncope/bradycardia: -Bradycardia improved with holding of Coreg Restarted at 6.25 mg twice a day Would continue at this dose, heart rates in the high 50s  2. CAD s/p PCI as above: -No angina -Continue aspirin  and Plavix -Crestor 40 mg , coreg  3.  malignant HTN: She prefers not to take amlodipine, tolerating 5 mg daily at her acceptance to take a lower dose --On losartan 100 mg daily --Coreg 6.25 mill grams twice a day --We'll avoid clonidine given bradycardia Avoid higher dose amlodipine given leg edema and patient intolerance --Other options for hypertension medication include hydralazine, Imdur, Cardura --- She is concerned about adding additional medications, will check orthostatics first She continues to run high when standing, we'll  start with hydralazine 3 times a day, typically well tolerated  4. HLD: -Crestor as above  Signed, Dossie Arbourim Wing Gfeller MD [facility] 08/30/2015, 8:30 PM

## 2015-08-30 NOTE — Telephone Encounter (Signed)
Rx(s) sent to pharmacy electronically.  

## 2015-08-30 NOTE — Progress Notes (Signed)
[facility] -  at [facility]   PATIENT NAME: [name]    MR#:  [id number]  DATE OF BIRTH:  10/05/1933  SUBJECTIVE:  CHIEF COMPLAINT:  Patient is  feeling much better today, epigastric discomfort is resolved after she took milk of magnesia  Denies any headache or dizziness   REVIEW OF SYSTEMS:  CONSTITUTIONAL: No fever, fatigue or weakness.  EYES: No blurred or double vision.  EARS, NOSE, AND THROAT: No tinnitus or ear pain.  RESPIRATORY: No cough, shortness of breath, wheezing or hemoptysis.  CARDIOVASCULAR: No chest pain, orthopnea, edema. Reporting dizziness  GASTROINTESTINAL: No nausea, vomiting, diarrhea or abdominal pain.  GENITOURINARY: No dysuria, hematuria.  ENDOCRINE: No polyuria, nocturia,  HEMATOLOGY: No anemia, easy bruising or bleeding SKIN: No rash or lesion. MUSCULOSKELETAL: No joint pain or arthritis.   NEUROLOGIC: No tingling, numbness, weakness.  PSYCHIATRY: No anxiety or depression.   DRUG ALLERGIES:   Allergies  Allergen Reactions  . Contrast Media [Iodinated Diagnostic Agents] Nausea And Vomiting  . Codeine Nausea And Vomiting and Anxiety    VITALS:  Blood pressure 179/60, pulse 55, temperature 98.4 F (36.9 C), temperature source Oral, resp. rate 16, height 5\' 5"  (1.651 m), weight 94.983 kg (209 lb 6.4 oz), SpO2 97 %.  PHYSICAL EXAMINATION:  GENERAL:  81 y.o.-year-old patient lying in the bed with no acute distress.  EYES: Pupils equal, round, reactive to light and accommodation. No scleral icterus. Extraocular muscles intact.  HEENT: Head atraumatic, normocephalic. Oropharynx and nasopharynx clear.  NECK:  Supple, no jugular venous distention. No thyroid enlargement, no tenderness.  LUNGS: Normal breath sounds bilaterally, no wheezing, rales,rhonchi or crepitation. No use of accessory muscles of respiration.  CARDIOVASCULAR: S1, S2 normal. No murmurs, rubs, or gallops.  ABDOMEN: Soft, positive epigastric  tenderness no rebound tenderness, nondistended. Bowel sounds present. No organomegaly or mass. Obese  EXTREMITIES: No pedal edema, cyanosis, or clubbing.  NEUROLOGIC: Cranial nerves II through XII are intact. Muscle strength 5/5 in all extremities. Sensation intact. Gait not checked.  PSYCHIATRIC: The patient is alert and oriented x 3.  SKIN: No obvious rash, lesion, or ulcer.    LABORATORY PANEL:   CBC  Recent Labs Lab 08/29/15 0132  WBC 7.0  HGB 11.7*  HCT 36.1  PLT 152   ------------------------------------------------------------------------------------------------------------------  Chemistries   Recent Labs Lab 08/29/15 0132  NA 140  K 3.8  CL 114*  CO2 22  GLUCOSE 140*  BUN 17  CREATININE 0.91  CALCIUM 8.4*   ------------------------------------------------------------------------------------------------------------------  Cardiac Enzymes  Recent Labs Lab 08/29/15 1254  TROPONINI 0.04*   ------------------------------------------------------------------------------------------------------------------  RADIOLOGY:  No results found.  EKG:   Orders placed or performed during the hospital encounter of 08/28/15  . EKG 12-Lead  . EKG 12-Lead  . ED EKG  . ED EKG    ASSESSMENT AND PLAN:   81 year old African-American female history of coronary artery disease, hyperlipidemia unspecified, essential hypertension who is presenting with near syncope 2.  #. Near syncope/bradycardia: Ruled out acute MI with 3 negative sets of cardiac enzymes -Bradycardia improved with holding of Coreg, cardiology has restarted Coreg at low-dose after beta blocker wash yesterday -Continue to monitor on telemetry , outpatient event monitor as recommended by cardiology -Check echo to evaluate LV function and wall motion -Recent cardiac cath 07/2014 unchanged from prior with medical management being recommended  #.  Essential hypertension-uncontrolled - continue Losartan 50  mg daily -Coreg is resume today after beta blocker wash out yesterday by holding  beta blocker  in view of bradycardia -Small dose of amlodipine is started today -IV hydralazine as needed basis -Monitor and titrate as needed   #CAD s/p PCI as above: -Continue aspirin and Plavix - cardiology has restarted  Coreg at a lower dose today , monitor patient closely on telemetry in view of bradycardia  -Crestor 40 mg  Appreciate cardiology recommendations    #hyperlipidemia continue Crestor  physical therapy for deconditioning      All the records are reviewed and case discussed with Care Management/Social Workerr. Management plans discussed with the patient, family and they are in agreement.  CODE STATUS: FC  TOTAL TIME TAKING CARE OF THIS PATIENT: 35  minutes.   POSSIBLE D/C IN 2-3 DAYS, DEPENDING ON CLINICAL CONDITION.   Ramonita Lab M.D on 08/30/2015 at 3:36 PM  Between 7am to 6pm - Pager - [phone] After 6pm go to [email or web address] - password [email or web address] [facility]  [facility] [place] [facility]  Office  [phone]  CC: Primary care physician; Colette Ribas, MD

## 2015-08-30 NOTE — Progress Notes (Addendum)
Patient bp still elevated, Dr. Mariah MillingGollan notified and new orders placed. Patient otherwise stable, will reassess bp in 1-2 hrs per Dr. Mariah MillingGollan after meds given. [name]

## 2015-08-31 DIAGNOSIS — I10 Essential (primary) hypertension: Secondary | ICD-10-CM | POA: Diagnosis present

## 2015-08-31 DIAGNOSIS — Z885 Allergy status to narcotic agent status: Secondary | ICD-10-CM | POA: Diagnosis not present

## 2015-08-31 DIAGNOSIS — M1990 Unspecified osteoarthritis, unspecified site: Secondary | ICD-10-CM | POA: Diagnosis present

## 2015-08-31 DIAGNOSIS — E785 Hyperlipidemia, unspecified: Secondary | ICD-10-CM | POA: Diagnosis present

## 2015-08-31 DIAGNOSIS — Z6834 Body mass index (BMI) 34.0-34.9, adult: Secondary | ICD-10-CM | POA: Diagnosis not present

## 2015-08-31 DIAGNOSIS — K219 Gastro-esophageal reflux disease without esophagitis: Secondary | ICD-10-CM | POA: Diagnosis present

## 2015-08-31 DIAGNOSIS — I951 Orthostatic hypotension: Secondary | ICD-10-CM | POA: Diagnosis not present

## 2015-08-31 DIAGNOSIS — Z91041 Radiographic dye allergy status: Secondary | ICD-10-CM | POA: Diagnosis not present

## 2015-08-31 DIAGNOSIS — G4730 Sleep apnea, unspecified: Secondary | ICD-10-CM | POA: Diagnosis present

## 2015-08-31 DIAGNOSIS — I2510 Atherosclerotic heart disease of native coronary artery without angina pectoris: Secondary | ICD-10-CM | POA: Diagnosis present

## 2015-08-31 DIAGNOSIS — G4733 Obstructive sleep apnea (adult) (pediatric): Secondary | ICD-10-CM | POA: Diagnosis present

## 2015-08-31 DIAGNOSIS — R001 Bradycardia, unspecified: Secondary | ICD-10-CM | POA: Diagnosis not present

## 2015-08-31 DIAGNOSIS — R55 Syncope and collapse: Secondary | ICD-10-CM | POA: Diagnosis not present

## 2015-08-31 DIAGNOSIS — Z9861 Coronary angioplasty status: Secondary | ICD-10-CM | POA: Diagnosis not present

## 2015-08-31 DIAGNOSIS — Z7982 Long term (current) use of aspirin: Secondary | ICD-10-CM | POA: Diagnosis not present

## 2015-08-31 DIAGNOSIS — Z7902 Long term (current) use of antithrombotics/antiplatelets: Secondary | ICD-10-CM | POA: Diagnosis not present

## 2015-08-31 DIAGNOSIS — Z955 Presence of coronary angioplasty implant and graft: Secondary | ICD-10-CM | POA: Diagnosis not present

## 2015-08-31 DIAGNOSIS — Z79899 Other long term (current) drug therapy: Secondary | ICD-10-CM | POA: Diagnosis not present

## 2015-08-31 MED ORDER — HYDRALAZINE HCL 50 MG PO TABS
75.0000 mg | ORAL_TABLET | Freq: Four times a day (QID) | ORAL | Status: DC
  Administered 2015-08-31 – 2015-09-02 (×9): 75 mg via ORAL
  Filled 2015-08-31 (×9): qty 1

## 2015-08-31 NOTE — Progress Notes (Addendum)
Physical therapy attempted working with patient at 12:30 after bp has decreased. When patient sitting on side of bed bp increased to SBP 215. Patient given apresoline just prior to check. Upon recheck SBP at 126. Ryan Dunn notified.  Trudee KusterBrandi R Mansfield

## 2015-08-31 NOTE — Care Management (Signed)
Inpatient determination received from med management

## 2015-08-31 NOTE — Progress Notes (Signed)
Patient BP still elevated this am, morning medications given and recheck BP has decreased. SBP still 173, will notify Gouru.  Trudee KusterBrandi R Mansfield

## 2015-08-31 NOTE — Progress Notes (Signed)
Physical Therapy Evaluation Patient Details Name: [name] MRN: [id number] DOB: November 13, 1933 Today's Date: 08/31/2015   History of Present Illness  [name] is a 81 y.o. female with a known history of coronary artery disease, essential hypertension, hyperlipidemia unspecified who is presenting after almost passing out. She describes 2 instances today first while in the store at [facility] shopping for some shoes when she felt lightheaded and nauseous she sat down her symptoms passed after a few minutes. The second instance [place] [employer]. Essentially repeat of the same symptoms lightheadedness followed by nausea she denies any chest pain palpitations or actual vomiting. In the emergency department she was noted to be bradycardic on arrival currently she is asymptomatic.  Clinical Impression  Pt presents at baseline this session and does not require additional PT services.  Pt able to get up out of bed with modified independence and ambulate in room with modified independence.  Pt uses a cane at home and reports "bone on bone" in both knees.  Pt continues with elevated BP this session, up to 215/75 sitting edge of bed, therefore further gait testing limited.  RN notified of BP findings and will re-assess pt.  No further skilled PT needs at this time.    Follow Up Recommendations No PT follow up    Equipment Recommendations  Cane    Recommendations for Other Services       Precautions / Restrictions Precautions Precautions: Fall Restrictions Weight Bearing Restrictions: No      Mobility  Bed Mobility Overal bed mobility: Modified Independent             General bed mobility comments: extra time to sit at side of bed  Transfers Overall transfer level: Modified independent Equipment used: None             General transfer comment: Extra time due to knee pain  Ambulation/Gait Ambulation/Gait assistance: Modified independent (Device/Increase  time) Ambulation Distance (Feet): 10 Feet Assistive device: None Gait Pattern/deviations: Antalgic     General Gait Details: Gait limited due to increase in BP prior to standing.  Stairs            Wheelchair Mobility    Modified Rankin (Stroke Patients Only)       Balance Overall balance assessment: Modified Independent                                           Pertinent Vitals/Pain Pain Assessment: No/denies pain    Home Living Family/patient expects to be discharged to:: Private residence Living Arrangements: Children Available Help at Discharge: Family Type of Home: House Home Access: Stairs to enter Entrance Stairs-Rails: Right Entrance Stairs-Number of Steps: 3 Home Layout: One level Home Equipment: Environmental consultant - 2 wheels;Cane - single point      Prior Function Level of Independence: Independent with assistive device(s)         Comments: Has bad knees, uses cane/RW depending on pain level.      Hand Dominance        Extremity/Trunk Assessment   Upper Extremity Assessment: Overall WFL for tasks assessed           Lower Extremity Assessment: Overall WFL for tasks assessed         Communication   Communication: No difficulties  Cognition Arousal/Alertness: Awake/alert Behavior During Therapy: WFL for tasks assessed/performed Overall Cognitive Status: Within  Functional Limits for tasks assessed                      General Comments General comments (skin integrity, edema, etc.): visbible areas c/d/i    Exercises        Assessment/Plan    PT Assessment Patent does not need any further PT services  PT Diagnosis     PT Problem List    PT Treatment Interventions     PT Goals (Current goals can be found in the Care Plan section) Acute Rehab PT Goals Patient Stated Goal: "I just want to be home for Christmas." PT Goal Formulation: With patient Time For Goal Achievement: 09/07/15 Potential to Achieve Goals:  Good    Frequency     Barriers to discharge        Co-evaluation               End of Session   Activity Tolerance: Patient tolerated treatment well Patient left: in bed;with call bell/phone within reach;with bed alarm set Nurse Communication: Mobility status;Other (comment) (increased BP)    Functional Assessment Tool Used: clinical judgement Functional Limitation: Mobility: Walking and moving around Mobility: Walking and Moving Around Current Status [phone](G8978): At least 1 percent but less than 20 percent impaired, limited or restricted Mobility: Walking and Moving Around Goal Status [phone](G8979): At least 1 percent but less than 20 percent impaired, limited or restricted Mobility: Walking and Moving Around Discharge Status [phone](G8980): At least 1 percent but less than 20 percent impaired, limited or restricted    Time: 1130-1200 PT Time Calculation (min) (ACUTE ONLY): 30 min   Charges:   PT Evaluation $Initial PT Evaluation Tier I: 1 Procedure     PT G Codes:   PT G-Codes **NOT FOR INPATIENT CLASS** Functional Assessment Tool Used: clinical judgement Functional Limitation: Mobility: Walking and moving around Mobility: Walking and Moving Around Current Status ([id number]): At least 1 percent but less than 20 percent impaired, limited or restricted Mobility: Walking and Moving Around Goal Status [phone](G8979): At least 1 percent but less than 20 percent impaired, limited or restricted Mobility: Walking and Moving Around Discharge Status [phone](G8980): At least 1 percent but less than 20 percent impaired, limited or restricted    Vestal Crandall A Cochise Dinneen 08/31/2015, 12:27 PM

## 2015-08-31 NOTE — Progress Notes (Signed)
   08/31/15 0540 08/31/15 0703 08/31/15 0705  Vital Signs  BP (!) 202/64 mmHg (!) 213/84 mmHg (!) 192/82 mmHg     08/31/15 0830  Vital Signs  BP (!) 201/91 mmHg

## 2015-08-31 NOTE — Progress Notes (Signed)
Patient: [name] / Admit Date: 08/28/2015 / Date of Encounter: 08/31/2015, 12:07 PM   Subjective: She is feeling better today but blood pressure is running high. Hydralazine was increased.  Review of Systems: Review of Systems  Constitutional: Negative.   Respiratory: Negative.   Cardiovascular: Negative.   Gastrointestinal: Negative.   Musculoskeletal: Negative.   Neurological: Negative.   Psychiatric/Behavioral: Negative.   All other systems reviewed and are negative.    Objective: Telemetry: NSR Physical Exam: Blood pressure 153/54, pulse 65, temperature 98.2 F (36.8 C), temperature source Oral, resp. rate 16, height 5\' 5"  (1.651 m), weight 209 lb 6.4 oz (94.983 kg), SpO2 99 %. Body mass index is 34.85 kg/(m^2). General: Well developed, well nourished, in no acute distress.Obese Head: Normocephalic, atraumatic, sclera non-icteric, no xanthomas, nares are without discharge. Neck: Negative for carotid bruits. JVP not elevated. Lungs: Clear bilaterally to auscultation without wheezes, rales, or rhonchi. Breathing is unlabored. Heart: RRR S1 S2 without murmurs, rubs, or gallops.  Abdomen: Soft, non-tender, non-distended with normoactive bowel sounds. No rebound/guarding. Extremities: No clubbing or cyanosis. No edema. Distal pedal pulses are 2+ and equal bilaterally. Neuro: Alert and oriented X 3. Moves all extremities spontaneously. Psych:  Responds to questions appropriately with a normal affect.   Intake/Output Summary (Last 24 hours) at 08/31/15 1207 Last data filed at 08/31/15 1150  Gross per 24 hour  Intake    723 ml  Output   2650 ml  Net  -1927 ml    Inpatient Medications:  . amLODipine  5 mg Oral Daily  . aspirin  81 mg Oral Daily  . carvedilol  6.25 mg Oral BID WC  . cholecalciferol  2,000 Units Oral Daily  . clopidogrel  75 mg Oral Daily  . enoxaparin (LOVENOX) injection  40 mg Subcutaneous Q24H  . fluticasone  1 spray Each Nare Daily  .  hydrALAZINE  75 mg Oral 4 times per day  . iron polysaccharides  150 mg Oral Daily  . losartan  100 mg Oral Daily  . pantoprazole  40 mg Oral Daily  . rosuvastatin  40 mg Oral QODAY  . sodium chloride  3 mL Intravenous Q12H   Infusions:    Labs:  Recent Labs  08/28/15 1939 08/29/15 0132  NA 141 140  K 3.9 3.8  CL 111 114*  CO2 25 22  GLUCOSE 130* 140*  BUN 21* 17  CREATININE 1.08* 0.91  CALCIUM 8.8* 8.4*   No results for input(s): AST, ALT, ALKPHOS, BILITOT, PROT, ALBUMIN in the last 72 hours.  Recent Labs  08/28/15 1939 08/29/15 0132  WBC 7.2 7.0  HGB 12.4 11.7*  HCT 37.5 36.1  MCV 85.3 84.7  PLT 174 152    Recent Labs  08/29/15 0132 08/29/15 0654 08/29/15 0903 08/29/15 1254  TROPONINI <0.03 <0.03 <0.03 0.04*   Invalid input(s): POCBNP No results for input(s): HGBA1C in the last 72 hours.   Weights: Filed Weights   08/28/15 1930 08/29/15 0105  Weight: 211 lb (95.709 kg) 209 lb 6.4 oz (94.983 kg)     Radiology/Studies:  No results found.   Assessment and Plan  81 y.o. female   1. Near syncope/bradycardia: -Bradycardia improved with holding of Coreg Restarted at 6.25 mg twice a day Would continue at this dose, heart rates in the high 50s  2. CAD s/p PCI as above: -No angina -Continue aspirin and Plavix -Crestor 40 mg , coreg  3.  malignant HTN: She prefers not to  take amlodipine, tolerating 5 mg daily at her acceptance to take a lower dose -- She is currently on amlodipine 5 mg once daily, carvedilol 6.25 mg twice daily and losartan 100 mg once daily. Hydralazine was added and the dose was increased today to 75 mg 3 times daily.  In the future, a thiazide diuretic might be considered.  4. HLD: -Crestor as above  Signed, Lorine Bears, MD [facility] 08/31/2015, 12:07 PM

## 2015-08-31 NOTE — Progress Notes (Signed)
Per Dr. Amado CoeGouru, apresoline increased to 75mg . Recheck SBP 154, apresoline given. Will recheck. [name] KusterBrandi R Johnson

## 2015-08-31 NOTE — Progress Notes (Signed)
    Notified of ongoing BP issues. Patient's BP up to 215 systolic s/p PT. Back to 120s at rest. Could use chlorthalidone in the future if needed.

## 2015-08-31 NOTE — Progress Notes (Signed)
When patient BP rechecked after norvasc, BP still elevated SBP 200's. Coreg and apresoline PO given and Dr. Kirke CorinArida notified. No new orders. When BP rechecked SBP 150's. Dr. Kirke CorinArida notified of all BP changes. Trudee KusterBrandi R Mansfield

## 2015-08-31 NOTE — Care Management (Signed)
Systolic blood pressure > 200 today.  Within the last 24 hours.  Patient is not able to tolerate the prn IV hydralazine.  Blood pressure control has also been made difficult by bradycardia.  There is a 30 point drop in orthostatic blood pressure upon standing documented 08/30/2015.  Spoke with attending.  Blood pressure has decreased after scheduled oral doses of meds but within a few hours systolic is again up to 200. Do not anticipate discharge home today. It would be unsafe to discarge patient home without an established trend of  blood pressure stability

## 2015-08-31 NOTE — Progress Notes (Signed)
[facility] [place] [facility]   PATIENT NAME: [name]    MR#:  [id number]  DATE OF BIRTH:  10/19/1933  SUBJECTIVE:  CHIEF COMPLAINT:  Patient is  Feeling  better today, epigastric discomfort is resolved after she took milk of magnesia  Patient is orthostatic but denies any dizziness  REVIEW OF SYSTEMS:  CONSTITUTIONAL: No fever, fatigue or weakness.  EYES: No blurred or double vision.  EARS, NOSE, AND THROAT: No tinnitus or ear pain.  RESPIRATORY: No cough, shortness of breath, wheezing or hemoptysis.  CARDIOVASCULAR: No chest pain, orthopnea, edema. Reporting dizziness  GASTROINTESTINAL: No nausea, vomiting, diarrhea or abdominal pain.  GENITOURINARY: No dysuria, hematuria.  ENDOCRINE: No polyuria, nocturia,  HEMATOLOGY: No anemia, easy bruising or bleeding SKIN: No rash or lesion. MUSCULOSKELETAL: No joint pain or arthritis.   NEUROLOGIC: No tingling, numbness, weakness.  PSYCHIATRY: No anxiety or depression.   DRUG ALLERGIES:   Allergies  Allergen Reactions  . Contrast Media [Iodinated Diagnostic Agents] Nausea And Vomiting  . Codeine Nausea And Vomiting and Anxiety    VITALS:  Blood pressure 193/79, pulse 53, temperature 98.2 F (36.8 C), temperature source Oral, resp. rate 16, height 5\' 5"  (1.651 m), weight 94.983 kg (209 lb 6.4 oz), SpO2 99 %.  PHYSICAL EXAMINATION:  GENERAL:  81 y.o.-year-old patient lying in the bed with no acute distress.  EYES: Pupils equal, round, reactive to light and accommodation. No scleral icterus. Extraocular muscles intact.  HEENT: Head atraumatic, normocephalic. Oropharynx and nasopharynx clear.  NECK:  Supple, no jugular venous distention. No thyroid enlargement, no tenderness.  LUNGS: Normal breath sounds bilaterally, no wheezing, rales,rhonchi or crepitation. No use of accessory muscles of respiration.  CARDIOVASCULAR: S1, S2 normal. No murmurs, rubs, or gallops.  ABDOMEN: Soft, positive epigastric  tenderness no rebound tenderness, nondistended. Bowel sounds present. No organomegaly or mass. Obese  EXTREMITIES: No pedal edema, cyanosis, or clubbing.  NEUROLOGIC: Cranial nerves II through XII are intact. Muscle strength 5/5 in all extremities. Sensation intact. Gait not checked.  PSYCHIATRIC: The patient is alert and oriented x 3.  SKIN: No obvious rash, lesion, or ulcer.    LABORATORY PANEL:   CBC  Recent Labs Lab 08/29/15 0132  WBC 7.0  HGB 11.7*  HCT 36.1  PLT 152   ------------------------------------------------------------------------------------------------------------------  Chemistries   Recent Labs Lab 08/29/15 0132  NA 140  K 3.8  CL 114*  CO2 22  GLUCOSE 140*  BUN 17  CREATININE 0.91  CALCIUM 8.4*   ------------------------------------------------------------------------------------------------------------------  Cardiac Enzymes  Recent Labs Lab 08/29/15 1254  TROPONINI 0.04*   ------------------------------------------------------------------------------------------------------------------  RADIOLOGY:  No results found.  EKG:   Orders placed or performed during the hospital encounter of 08/28/15  . EKG 12-Lead  . EKG 12-Lead  . ED EKG  . ED EKG    ASSESSMENT AND PLAN:   81 year old African-American female history of coronary artery disease, hyperlipidemia unspecified, essential hypertension who is presenting with near syncope 2.  #. Near syncope/bradycardia: Ruled out acute MI with 3 negative sets of cardiac enzymes -Bradycardia improved with holding of Coreg, cardiology has restarted Coreg at low-dose after beta blocker wash yesterday -Continue to monitor on telemetry , outpatient event monitor as recommended by cardiology -Check echo to evaluate LV function and wall motion -Recent cardiac cath 07/2014 unchanged from prior with medical management being recommended  #.  Essential hypertension-uncontrolled-cardiology is managing  and titrating the bloodpressuremedications- as she is fluctuating a lot and orthostatic -  Losartan  Dose increased 200 mg by mouth once daily -hydralazine dose is increased to 7 family every6hours -amlodipine 5 mg by mouth once daily, hesitant to increase the dose because of the pedal edema -Coreg was resumed yesterday but the hesitant to increase the dose because of the bradycardia i-IV hydralazine as needed basis -Monitor and titrate as needed   #CAD s/p PCI as above: -Continue aspirin, Coreg and Plavix  -Crestor 40 mg  Appreciate cardiology recommendations    #hyperlipidemia continue Crestor  physical therapy for deconditioning      All the records are reviewed and case discussed with Care Management/Social Workerr. Management plans discussed with the patient, family and they are in agreement.  CODE STATUS: FC  TOTAL TIME TAKING CARE OF THIS PATIENT: 35  minutes.   POSSIBLE D/C IN 2-3 DAYS, DEPENDING ON CLINICAL CONDITION.   Ramonita Lab M.D on 08/31/2015 at 4:37 PM  Between 7am to 6pm - Pager - [phone] After 6pm go to [email or web address] - password [email or web address] [facility]  [place] [facility]  Office  [phone]  CC: Primary care physician; Colette Ribas, MD

## 2015-08-31 NOTE — Progress Notes (Signed)
Spoke with Dr. Kirke CorinArida about patient BP increasing to SBP 193. Instructed to give BP med now (norvasc) and recheck in 1 hour then give coreg and apresoline. Norvasc given, will recheck in 1 hour. Trudee KusterBrandi R Mansfield

## 2015-09-01 LAB — CREATININE, SERUM
Creatinine, Ser: 0.76 mg/dL (ref 0.44–1.00)
GFR calc Af Amer: >60 mL/min (ref >60)
GFR calc non Af Amer: >60 mL/min (ref >60)

## 2015-09-01 MED ORDER — CHLORTHALIDONE 25 MG PO TABS
12.5000 mg | ORAL_TABLET | Freq: Every day | ORAL | Status: DC
  Administered 2015-09-01 – 2015-09-02 (×2): 12.5 mg via ORAL
  Filled 2015-09-01: qty 0.5
  Filled 2015-09-01: qty 1

## 2015-09-01 NOTE — Progress Notes (Signed)
Patient: [name] / Admit Date: 08/28/2015 / Date of Encounter: 09/01/2015, 11:25 AM   Subjective: Feeling better. Blood pressure continues to run high, into the 160s systolic.   Review of Systems: Review of Systems  Constitutional: Negative.   HENT: Negative.   Eyes: Negative.   Respiratory: Negative.   Cardiovascular: Negative.   Musculoskeletal: Negative.   Skin: Negative.   Neurological: Negative.   Endo/Heme/Allergies: Negative.   Psychiatric/Behavioral: Negative.      Objective: Telemetry: NSR, 60's Physical Exam: Blood pressure 164/51, pulse 69, temperature 98.4 F (36.9 C), temperature source Oral, resp. rate 16, height  (1.651 m), weight 209 lb 6.4 oz (94.983 kg), SpO2 98 %. Body mass index is 34.85 kg/(m^2). General: Well developed, well nourished, in no acute distress. Head: Normocephalic, atraumatic, sclera non-icteric, no xanthomas, nares are without discharge. Neck: Negative for carotid bruits. JVP not elevated. Lungs: Clear bilaterally to auscultation without wheezes, rales, or rhonchi. Breathing is unlabored. Heart: RRR S1 S2 without murmurs, rubs, or gallops.  Abdomen: Soft, non-tender, non-distended with normoactive bowel sounds. No rebound/guarding. Extremities: No clubbing or cyanosis. No edema. Distal pedal pulses are 2+ and equal bilaterally. Neuro: Alert and oriented X 3. Moves all extremities spontaneously. Psych:  Responds to questions appropriately with a normal affect.   Intake/Output Summary (Last 24 hours) at 09/01/15 1125 Last data filed at 09/01/15 1040  Gross per 24 hour  Intake    480 ml  Output   2400 ml  Net  -1920 ml    Inpatient Medications:  . amLODipine  5 mg Oral Daily  . aspirin  81 mg Oral Daily  . carvedilol  6.25 mg Oral BID WC  . cholecalciferol  2,000 Units Oral Daily  . clopidogrel  75 mg Oral Daily  . enoxaparin (LOVENOX) injection  40 mg Subcutaneous Q24H  . fluticasone  1 spray Each Nare Daily  .  hydrALAZINE  75 mg Oral 4 times per day  . iron polysaccharides  150 mg Oral Daily  . losartan  100 mg Oral Daily  . pantoprazole  40 mg Oral Daily  . rosuvastatin  40 mg Oral QODAY  . sodium chloride  3 mL Intravenous Q12H   Infusions:    Labs:  Recent Labs  09/01/15 0354  CREATININE 0.76   No results for input(s): AST, ALT, ALKPHOS, BILITOT, PROT, ALBUMIN in the last 72 hours. No results for input(s): WBC, NEUTROABS, HGB, HCT, MCV, PLT in the last 72 hours.  Recent Labs  08/29/15 1254  TROPONINI 0.04*   Invalid input(s): POCBNP No results for input(s): HGBA1C in the last 72 hours.   Weights: Filed Weights   08/28/15 1930 08/29/15 0105  Weight: 211 lb (95.709 kg) 209 lb 6.4 oz (94.983 kg)     Radiology/Studies:  No results found.   Assessment and Plan   1. Near syncope/bradycardia: -Bradycardia improved with holding of Coreg -Restarted Coreg at 6.25 mg twice a day, tolerating without issues -Would continue at this dose, heart rates in the high 50s  2. CAD s/p PCI as above: -No angina -Continue aspirin and Plavix -Crestor 40 mg, Coreg  3.Malignant HTN: -She prefers not to take amlodipine, tolerating 5 mg daily at her acceptance to take a lower dose -She is currently on amlodipine 5 mg once daily, carvedilol 6.25 mg twice daily, losartan 100 mg once daily, and hydralazine 75 mg q 6 hours -BP fluctuates from the 200 systolic with exertion down to a low of  120s systolic. Currently in the 160s systolic  -Add chorthalidone 12.5 mg daily with hold parameters   4. HLD: -Crestor as above  5. Dispo: -[name] is her care taker and will need to be at her outpatient appointments   Signed, Eula ListenRyan Orvetta Danielski, PA-C Pager: [phone] 09/01/2015, 11:25 AM

## 2015-09-01 NOTE — Progress Notes (Signed)
[facility] [place] [facility]   PATIENT NAME: [name]    MR#:  [id number]  DATE OF BIRTH:  09/22/1933  SUBJECTIVE:  CHIEF COMPLAINT:  Feeling better blood pressure still little elevated medications being adjusted by cardiology  REVIEW OF SYSTEMS:  CONSTITUTIONAL: No fever, fatigue or weakness.  EYES: No blurred or double vision.  EARS, NOSE, AND THROAT: No tinnitus or ear pain.  RESPIRATORY: No cough, shortness of breath, wheezing or hemoptysis.  CARDIOVASCULAR: No chest pain, orthopnea, edema. Reporting dizziness  GASTROINTESTINAL: No nausea, vomiting, diarrhea or abdominal pain.  GENITOURINARY: No dysuria, hematuria.  ENDOCRINE: No polyuria, nocturia,  HEMATOLOGY: No anemia, easy bruising or bleeding SKIN: No rash or lesion. MUSCULOSKELETAL: No joint pain or arthritis.   NEUROLOGIC: No tingling, numbness, weakness.  PSYCHIATRY: No anxiety or depression.   DRUG ALLERGIES:   Allergies  Allergen Reactions  . Contrast Media [Iodinated Diagnostic Agents] Nausea And Vomiting  . Codeine Nausea And Vomiting and Anxiety    VITALS:  Blood pressure 125/66, pulse 68, temperature 98.4 F (36.9 C), temperature source Oral, resp. rate 18, height 5\' 5"  (1.651 m), weight 94.983 kg (209 lb 6.4 oz), SpO2 93 %.  PHYSICAL EXAMINATION:  GENERAL:  81 y.o.-year-old patient lying in the bed with no acute distress.  EYES: Pupils equal, round, reactive to light and accommodation. No scleral icterus. Extraocular muscles intact.  HEENT: Head atraumatic, normocephalic. Oropharynx and nasopharynx clear.  NECK:  Supple, no jugular venous distention. No thyroid enlargement, no tenderness.  LUNGS: Normal breath sounds bilaterally, no wheezing, rales,rhonchi or crepitation. No use of accessory muscles of respiration.  CARDIOVASCULAR: S1, S2 normal. No murmurs, rubs, or gallops.  ABDOMEN: Soft, positive epigastric tenderness no rebound tenderness, nondistended. Bowel  sounds present. No organomegaly or mass. Obese  EXTREMITIES: No pedal edema, cyanosis, or clubbing.  NEUROLOGIC: Cranial nerves II through XII are intact. Muscle strength 5/5 in all extremities. Sensation intact. Gait not checked.  PSYCHIATRIC: The patient is alert and oriented x 3.  SKIN: No obvious rash, lesion, or ulcer.    LABORATORY PANEL:   CBC  Recent Labs Lab 08/29/15 0132  WBC 7.0  HGB 11.7*  HCT 36.1  PLT 152   ------------------------------------------------------------------------------------------------------------------  Chemistries   Recent Labs Lab 08/29/15 0132 09/01/15 0354  NA 140  --   K 3.8  --   CL 114*  --   CO2 22  --   GLUCOSE 140*  --   BUN 17  --   CREATININE 0.91 0.76  CALCIUM 8.4*  --    ------------------------------------------------------------------------------------------------------------------  Cardiac Enzymes  Recent Labs Lab 08/29/15 1254  TROPONINI 0.04*   ------------------------------------------------------------------------------------------------------------------  RADIOLOGY:  No results found.  EKG:   Orders placed or performed during the hospital encounter of 08/28/15  . EKG 12-Lead  . EKG 12-Lead  . ED EKG  . ED EKG    ASSESSMENT AND PLAN:   81 year old African-American female history of coronary artery disease, hyperlipidemia unspecified, essential hypertension who is presenting with near syncope 2.  #. Near syncope/bradycardia: -Bradycardia improved with holding of Coreg, cardiology has restarted Coreg at low-dose after beta blocker wash yesterday -Continue to monitor on telemetry , outpatient event monitor as recommended by cardiology -Recent cardiac cath 07/2014 unchanged from prior with medical management being recommended  #.  Essential hypertension-uncontrolled-cardiology is managing and titrating the bloodpressuremedications- as she is fluctuating a lot and orthostatic Continue amlodipine,  low-dose Coreg, hydralazine and losartan  #CAD s/p PCI as  above: -Continue aspirin, Coreg and Plavix  -Crestor 40 mg  Appreciate cardiology recommendations    #hyperlipidemia continue Crestor  physical therapy for deconditioning      All the records are reviewed and case discussed with Care Management/Social Workerr. Management plans discussed with the patient, family and they are in agreement.  CODE STATUS: FC  TOTAL TIME TAKING CARE OF THIS PATIENT: 25  minutes.   Anticipate discharge tomorrow   Auburn Bilberry M.D on 09/01/2015 at 2:33 PM  Between 7am to 6pm - Pager - [phone] After 6pm go to [email or web address] - password [email or web address]  [place] [facility]  Office  [phone]  CC: Primary care physician; Colette Ribas, MD

## 2015-09-02 MED ORDER — LOSARTAN POTASSIUM 50 MG PO TABS: 100.0000 mg | ORAL_TABLET | Freq: Every day | ORAL | Status: DC

## 2015-09-02 MED ORDER — CARVEDILOL 12.5 MG PO TABS: 6.2500 mg | ORAL_TABLET | Freq: Two times a day (BID) | ORAL | Status: DC

## 2015-09-02 MED ORDER — CHLORTHALIDONE 25 MG PO TABS: 12.5000 mg | ORAL_TABLET | Freq: Every day | ORAL | Status: DC

## 2015-09-02 MED ORDER — AMLODIPINE BESYLATE 10 MG PO TABS: 5.0000 mg | ORAL_TABLET | Freq: Every day | ORAL | Status: DC

## 2015-09-02 MED ORDER — HYDRALAZINE HCL 25 MG PO TABS: 75.0000 mg | ORAL_TABLET | Freq: Four times a day (QID) | ORAL | Status: DC

## 2015-09-02 NOTE — Progress Notes (Signed)
[name] was able to get to the floor before patient discharged. FMLA paperwork was filled out and this RN faxed for daughter. No further needs.

## 2015-09-02 NOTE — Care Management (Signed)
Patient to be discharged today to home.  Patient will be set up with home health services for nursing.  Patient provided with list of agencies.  Patient states " I don't have a preference as long as they cover where I live". Referral made to [facility].  Barbara CowerJason from [facility] notified.  RNCM signing off

## 2015-09-02 NOTE — Care Management Important Message (Signed)
Important Message  Patient Details  Name: [name] MRN: [id number] Date of Birth: 12/06/1933   Medicare Important Message Given:  Yes    Olegario MessierKathy A Yitzchok Carriger 09/02/2015, 11:20 AM

## 2015-09-02 NOTE — Progress Notes (Signed)
Discharge instructions given. IV and tele removed. Home health has been set up. Instructions given on hypertension, taking blood pressure, near syncope, and new medications. Prescriptions sent to pharmacy. No further questions.  Daughter needs Eula ListenRyan Dunn to sign FMLA paperwork. Dunn contacted and is unable to get to the floor at this time, but stated they can go and he will fax the paperwork to the correct place. Obtained paperwork from daughter with fax number and her address to send her a copy. Will follow up with Dunn to get paperwork filled out.

## 2015-09-02 NOTE — Discharge Instructions (Signed)

## 2015-09-02 NOTE — Discharge Summary (Signed)
[name], 81 y.o., DOB 09/21/1933, MRN [id number]. Admission date: 08/28/2015 Discharge Date 09/02/2015 Primary MD Colette RibasGOLDING, JOHN [name], MD Admitting Physician Wyatt Hasteavid K Hower, MD  Admission Diagnosis  Symptomatic bradycardia [R00.1]  Discharge Diagnosis   Principal Problem:   Near syncope Active Problems:   Symptomatic bradycardia   Hyperlipidemia   Epigastric fullness   HTN (hypertension), malignant   Hyperlipidemia GERD Morbid obesity Sleep apnea Osteoarthritis        Hospital Course  [name] is a 81 y.o. female with a known history of coronary artery disease, essential hypertension, hyperlipidemia unspecified who presented after almost passing out. She describes 2 instances today first while [facility] shopping for some shoes when she felt lightheaded and nauseous she sat down her symptoms passed after a few minutes. The second instance [place] [employer]. Essentially repeat of the same symptoms lightheadedness followed by nausea she denies any chest pain palpitations or actual vomiting. In the emergency department she was noted to be bradycardic on arrival currently she is asymptomatic. Her near syncope was felt to be due to her blood pressure medications. Patient likely has some formal autonomic dysfunction leading to labile blood pressure. She was seen by cardiology and her blood pressure medications were adjusted. Her Coreg was initially held and then restarted at a lower dose. Patient's symptoms have now improved her blood pressure is improved as well. Her primary care provider may consider doing a renal Doppler of her kidneys to rule out renal artery stenosis. At this time home health nurse has been arranged for monitoring of blood pressure closely.            Consults  cardiology  Significant Tests:  See full reports for all details    No results found.     Today   Subjective:   [name] feels well denies any chest pain  or shortness of breath  Objective:   Blood pressure 160/63, pulse 65, temperature 98.4 F (36.9 C), temperature source Oral, resp. rate 16, height 5\' 5"  (1.651 m), weight 94.983 kg (209 lb 6.4 oz), SpO2 100 %.  .  Intake/Output Summary (Last 24 hours) at 09/02/15 1320 Last data filed at 09/02/15 1003  Gross per 24 hour  Intake    243 ml  Output    700 ml  Net   -457 ml    Exam VITAL SIGNS: Blood pressure 160/63, pulse 65, temperature 98.4 F (36.9 C), temperature source Oral, resp. rate 16, height 5\' 5"  (1.651 m), weight 94.983 kg (209 lb 6.4 oz), SpO2 100 %.  GENERAL:  81 y.o.-year-old patient lying in the bed with no acute distress.  EYES: Pupils equal, round, reactive to light and accommodation. No scleral icterus. Extraocular muscles intact.  HEENT: Head atraumatic, normocephalic. Oropharynx and nasopharynx clear.  NECK:  Supple, no jugular venous distention. No thyroid enlargement, no tenderness.  LUNGS: Normal breath sounds bilaterally, no wheezing, rales,rhonchi or crepitation. No use of accessory muscles of respiration.  CARDIOVASCULAR: S1, S2 normal. No murmurs, rubs, or gallops.  ABDOMEN: Soft, nontender, nondistended. Bowel sounds present. No organomegaly or mass.  EXTREMITIES: No pedal edema, cyanosis, or clubbing.  NEUROLOGIC: Cranial nerves II through XII are intact. Muscle strength 5/5 in all extremities. Sensation intact. Gait not checked.  PSYCHIATRIC: The patient is alert and oriented x 3.  SKIN: No obvious rash, lesion, or ulcer.   Data Review     CBC w Diff: Lab Results  Component Value Date  WBC 7.0 08/29/2015   WBC 5.0 11/16/2014   HGB 11.7* 08/29/2015   HCT 36.1 08/29/2015   PLT 152 08/29/2015   LYMPHOPCT 36 07/10/2014   MONOPCT 7 07/10/2014   EOSPCT 1 07/10/2014   BASOPCT 1 07/10/2014   CMP: Lab Results  Component Value Date   NA 140 08/29/2015   NA 139 01/19/2015   K 3.8 08/29/2015   CL 114* 08/29/2015   CO2 22 08/29/2015   BUN 17  08/29/2015   BUN 14 01/19/2015   CREATININE 0.76 09/01/2015   CREATININE 0.96 03/02/2014   PROT 6.7 01/19/2015   PROT 6.3 07/11/2014   ALBUMIN 3.8 01/19/2015   ALBUMIN 3.0* 07/11/2014   BILITOT 0.3 01/19/2015   BILITOT 0.2* 07/11/2014   ALKPHOS 66 01/19/2015   AST 12 01/19/2015   ALT 7 01/19/2015  .  Micro Results No results found for this or any previous visit (from the past 240 hour(s)).      Code Status Orders        Start     Ordered   08/28/15 2317  Full code   Continuous     08/28/15 2317          Follow-up Information    Follow up with Colette Ribas, MD In 7 days.   Specialty:  Family Medicine   Contact information:   [address] [place] [address] [phone]       Follow up with Lennette Bihari, MD. Go on 09/06/2015.   Specialty:  Cardiology   Why:  at 11:00am.    Contact information:   [address] [place] [address] [phone]       Discharge Medications     Medication List    STOP taking these medications        furosemide 40 MG tablet  Commonly known as:  LASIX      TAKE these medications        amLODipine 10 MG tablet  Commonly known as:  NORVASC  Take 0.5 tablets (5 mg total) by mouth daily.     aspirin 81 MG tablet  Take 81 mg by mouth daily.     carvedilol 12.5 MG tablet  Commonly known as:  COREG  Take 0.5 tablets (6.25 mg total) by mouth 2 (two) times daily with a meal.     chlorthalidone 25 MG tablet  Commonly known as:  HYGROTON  Take 0.5 tablets (12.5 mg total) by mouth daily.     cholecalciferol 1000 UNITS tablet  Commonly known as:  VITAMIN D  Take 2,000 Units by mouth daily.     clopidogrel 75 MG tablet  Commonly known as:  PLAVIX  Take 1 tablet (75 mg total) by mouth daily.     diazepam 5 MG tablet  Commonly known as:  VALIUM  Take 5 mg by mouth at bedtime as needed for anxiety.     fluticasone 50 MCG/ACT nasal spray  Commonly known as:  FLONASE  Place 1  spray into the nose as needed for allergies or rhinitis.     hydrALAZINE 25 MG tablet  Commonly known as:  APRESOLINE  Take 3 tablets (75 mg total) by mouth every 6 (six) hours.     Iron Succinyl-Protein Complex 40 MG/15ML Soln  Take 40 mg by mouth daily.     losartan 50 MG tablet  Commonly known as:  COZAAR  Take 2 tablets (100 mg total) by mouth daily.     NEXIUM  40 MG capsule  Generic drug:  esomeprazole  TAKE 1 CAPSULE (40 MG TOTAL) BY MOUTH DAILY BEFORE BREAKFAST.     nitroGLYCERIN 0.4 MG SL tablet  Commonly known as:  NITROSTAT  Place 1 tablet (0.4 mg total) under the tongue every 5 (five) minutes as needed.     rosuvastatin 40 MG tablet  Commonly known as:  CRESTOR  Take 1 tablet (40 mg total) by mouth daily.           Total Time in preparing paper work, data evaluation and todays exam - 35 minutes  Auburn Bilberry M.D on 09/02/2015 at 1:20 PM  [facility]   Office  [phone]

## 2015-09-06 ENCOUNTER — Encounter: Payer: Self-pay | Admitting: Cardiology

## 2015-09-06 ENCOUNTER — Ambulatory Visit (INDEPENDENT_AMBULATORY_CARE_PROVIDER_SITE_OTHER): Payer: Medicare Other | Admitting: Cardiology

## 2015-09-06 VITALS — BP 132/62 | HR 69 | Ht 64.0 in | Wt 205.8 lb

## 2015-09-06 DIAGNOSIS — G4733 Obstructive sleep apnea (adult) (pediatric): Secondary | ICD-10-CM | POA: Diagnosis not present

## 2015-09-06 DIAGNOSIS — E669 Obesity, unspecified: Secondary | ICD-10-CM

## 2015-09-06 DIAGNOSIS — I2510 Atherosclerotic heart disease of native coronary artery without angina pectoris: Secondary | ICD-10-CM

## 2015-09-06 DIAGNOSIS — I452 Bifascicular block: Secondary | ICD-10-CM | POA: Insufficient documentation

## 2015-09-06 DIAGNOSIS — I200 Unstable angina: Secondary | ICD-10-CM

## 2015-09-06 DIAGNOSIS — I10 Essential (primary) hypertension: Secondary | ICD-10-CM | POA: Diagnosis not present

## 2015-09-06 DIAGNOSIS — R55 Syncope and collapse: Secondary | ICD-10-CM | POA: Diagnosis not present

## 2015-09-06 DIAGNOSIS — Z9861 Coronary angioplasty status: Secondary | ICD-10-CM

## 2015-09-06 DIAGNOSIS — E785 Hyperlipidemia, unspecified: Secondary | ICD-10-CM

## 2015-09-06 NOTE — Assessment & Plan Note (Signed)
Chronic. 

## 2015-09-06 NOTE — Assessment & Plan Note (Signed)
On statin Rx 

## 2015-09-06 NOTE — Assessment & Plan Note (Signed)
BMI 35 

## 2015-09-06 NOTE — Assessment & Plan Note (Signed)
Recent admission [facility] with uncontrolled HTN- better today

## 2015-09-06 NOTE — Assessment & Plan Note (Signed)
Refuses CPAP 

## 2015-09-06 NOTE — Patient Instructions (Signed)
Corine ShelterLuke Kilroy, [place], has made no changes today in your current medications or treatment plan.  Your physician recommends that you schedule a follow-up appointment in February 2017 with Dr. Tresa EndoKelly. You will receive a reminder letter in the mail two months in advance. If you don't receive a letter, please call our office to schedule the follow-up appointment.  If you need a refill on your cardiac medications before your next appointment, please call your pharmacy.

## 2015-09-06 NOTE — Assessment & Plan Note (Signed)
Suspect this was from bradycardia, this has improved with adjustments in her medication

## 2015-09-06 NOTE — Progress Notes (Signed)
09/06/2015 [name]   04/04/1934  [id number]  Primary Physician Colette RibasGOLDING, JOHN [name], MD Primary Cardiologist: Dr Tresa Endokelly  HPI:  Morbidly obese AA female with a history of remote RCA PCI, RI PCI 2007- patent sites Nov 2015- [facility] 08/29/15 with near syncope. She was found to be bradycardic- HR 50. Her medications were adjusted. Her HR improved but her B/P remained uncontrolled. Hydralazine was added and Albertson'syan Dunn PA added chlorthalidone at discharge.  The pt is in the office today for follow up. She has not had recurrent near syncope. Her B/P is under better control. She has not had LE edema on her current doe of Norvasc.    Current Outpatient Prescriptions  Medication Sig Dispense Refill  . amLODipine (NORVASC) 10 MG tablet Take 0.5 tablets (5 mg total) by mouth daily. 30 tablet 9  . aspirin 81 MG tablet Take 81 mg by mouth daily.    . carvedilol (COREG) 12.5 MG tablet Take 0.5 tablets (6.25 mg total) by mouth 2 (two) times daily with a meal. 90 tablet 6  . chlorthalidone (HYGROTON) 25 MG tablet Take 0.5 tablets (12.5 mg total) by mouth daily. 30 tablet 0  . cholecalciferol (VITAMIN D) 1000 UNITS tablet Take 2,000 Units by mouth daily.    . clopidogrel (PLAVIX) 75 MG tablet Take 1 tablet (75 mg total) by mouth daily. 30 tablet 7  . diazepam (VALIUM) 5 MG tablet Take 5 mg by mouth at bedtime as needed for anxiety.    . fluticasone (FLONASE) 50 MCG/ACT nasal spray Place 1 spray into the nose as needed for allergies or rhinitis.     . hydrALAZINE (APRESOLINE) 25 MG tablet Take 3 tablets (75 mg total) by mouth every 6 (six) hours. 120 tablet 0  . Iron Succinyl-Protein Complex 40 MG/15ML SOLN Take 40 mg by mouth daily.    Marland Kitchen. losartan (COZAAR) 50 MG tablet Take 2 tablets (100 mg total) by mouth daily. 45 tablet 6  . NEXIUM 40 MG capsule TAKE 1 CAPSULE (40 MG TOTAL) BY MOUTH DAILY BEFORE BREAKFAST. 30 capsule 9  . nitroGLYCERIN (NITROSTAT) 0.4 MG SL tablet Place 1 tablet (0.4 mg  total) under the tongue every 5 (five) minutes as needed. 25 tablet 3  . rosuvastatin (CRESTOR) 40 MG tablet Take 1 tablet (40 mg total) by mouth daily. (Patient taking differently: Take 40 mg by mouth every other day. ) 90 tablet 3   No current facility-administered medications for this visit.    Allergies  Allergen Reactions  . Contrast Media [Iodinated Diagnostic Agents] Nausea And Vomiting  . Codeine Nausea And Vomiting and Anxiety    Social History   Social History  . Marital Status: Single    Spouse Name: N/A  . Number of Children: N/A  . Years of Education: N/A   Occupational History  . Not on file.   Social History Main Topics  . Smoking status: Never Smoker   . Smokeless tobacco: Never Used  . Alcohol Use: No  . Drug Use: No  . Sexual Activity: Not on file   Other Topics Concern  . Not on file   Social History Narrative     Review of Systems: General: negative for chills, fever, night sweats or weight changes.  Cardiovascular: negative for chest pain, dyspnea on exertion, edema, orthopnea, palpitations, paroxysmal nocturnal dyspnea or shortness of breath Dermatological: negative for rash Respiratory: negative for cough or wheezing Urologic: negative for hematuria Abdominal: negative for nausea, vomiting, diarrhea,  bright red blood per rectum, melena, or hematemesis Neurologic: negative for visual changes, syncope, or dizziness All other systems reviewed and are otherwise negative except as noted above.    Blood pressure 132/62, pulse 69, height 5\' 4"  (1.626 m), weight 205 lb 12.8 oz (93.35 kg).  General appearance: alert, cooperative, no distress and morbidly obese Neck: no carotid bruit and no JVD Lungs: clear to auscultation bilaterally Heart: regular rate and rhythm Extremities: no edema Neurologic: Grossly normal  EKG NSR, 69, RBBB, LAFB  ASSESSMENT AND PLAN:   HTN (hypertension), malignant Recent admission to [facility] with uncontrolled HTN-  better today  Near syncope Suspect this was from bradycardia, this has improved with adjustments in her medication  Obstructive sleep apnea Refuses CPAP  Hyperlipidemia with target LDL less than 70 On statin Rx  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 Chronic  Obesity (BMI 30-39.9) BMI 35    PLAN  Same Rx. F/U Dr Tresa Endo in Feb as scheduled.   Corine Shelter K PA-C 09/06/2015 11:51 AM

## 2015-09-09 ENCOUNTER — Telehealth: Payer: Self-pay | Admitting: Physician Assistant

## 2015-09-09 NOTE — Telephone Encounter (Signed)
Talked with [employer], Alycia Rossettiyan and located original forms.  Faxed to [employer] and mailed originals as directed to daughter.

## 2015-09-09 NOTE — Telephone Encounter (Signed)
Patient daughter Tobi Bastosnna says she dropped of [place]ml[place] papers at [place] office for ryan to fill out on Wednesday of this week.   Unable to find papers in office.  Called [employer] and spoke with Joni Reiningicole to confirm receipt of documents.  She has paperwork and they are being processed at this time.   Patient daughter not sure if these are the right papers or are previously submitted forms.   Tobi Bastosnna was informed that Joni Reiningicole needed to ask her some questions about forms and that she would be getting a call from [place] shortly.   Also instructed to call jennifer at [place] office if this is not the right papers or if she needed further assistance.

## 2015-09-09 NOTE — Telephone Encounter (Signed)
[name] did not have the correct forms.  Patient had [facility] [employer] [facility] forms re faxed.   Received at 1045 [facility] [employer] via fax.

## 2015-09-12 ENCOUNTER — Other Ambulatory Visit: Payer: Self-pay | Admitting: Cardiovascular Disease

## 2015-09-13 NOTE — Telephone Encounter (Signed)
Rx request sent to pharmacy.  

## 2015-09-14 ENCOUNTER — Telehealth: Payer: Self-pay | Admitting: Cardiovascular Disease

## 2015-09-14 NOTE — Telephone Encounter (Signed)
Please call,concerning her medicine. °

## 2015-09-14 NOTE — Telephone Encounter (Signed)
Returned call to patient. We discussed that she is out of medications, she is a little confused as to which ones she needs refilled. She knows she needs a refill for hydralazine but does not know what else.  Her daughter handles her medications. Requested she have daughter call & discuss or give names of meds - we will be happy to refill promptly.  Pt voiced understanding - will have daughter call later today.

## 2015-09-15 DIAGNOSIS — E6609 Other obesity due to excess calories: Secondary | ICD-10-CM | POA: Diagnosis not present

## 2015-09-15 DIAGNOSIS — I10 Essential (primary) hypertension: Secondary | ICD-10-CM | POA: Diagnosis not present

## 2015-09-15 DIAGNOSIS — Z1389 Encounter for screening for other disorder: Secondary | ICD-10-CM | POA: Diagnosis not present

## 2015-09-15 DIAGNOSIS — Z6834 Body mass index (BMI) 34.0-34.9, adult: Secondary | ICD-10-CM | POA: Diagnosis not present

## 2015-10-06 ENCOUNTER — Other Ambulatory Visit (HOSPITAL_COMMUNITY): Payer: Self-pay | Admitting: Physician Assistant

## 2015-10-06 DIAGNOSIS — Z1231 Encounter for screening mammogram for malignant neoplasm of breast: Secondary | ICD-10-CM

## 2015-10-08 ENCOUNTER — Other Ambulatory Visit: Payer: Self-pay | Admitting: Cardiovascular Disease

## 2015-10-10 NOTE — Telephone Encounter (Signed)
Rx request sent to pharmacy.  

## 2015-10-20 ENCOUNTER — Ambulatory Visit (HOSPITAL_COMMUNITY)
Admission: RE | Admit: 2015-10-20 | Discharge: 2015-10-20 | Disposition: A | Discharge status: Home / Self Care | Payer: Medicare Other | Source: Ambulatory Visit | Attending: Physician Assistant | Admitting: Physician Assistant

## 2015-10-20 ENCOUNTER — Encounter (HOSPITAL_COMMUNITY): Payer: Self-pay

## 2015-10-20 ENCOUNTER — Other Ambulatory Visit: Payer: Self-pay

## 2015-10-20 ENCOUNTER — Emergency Department (HOSPITAL_COMMUNITY)
Admission: EM | Admit: 2015-10-20 | Discharge: 2015-10-20 | Disposition: A | Discharge status: Home / Self Care | Payer: Medicare Other | Attending: Emergency Medicine | Admitting: Emergency Medicine

## 2015-10-20 DIAGNOSIS — E860 Dehydration: Secondary | ICD-10-CM

## 2015-10-20 DIAGNOSIS — E7800 Pure hypercholesterolemia, unspecified: Secondary | ICD-10-CM | POA: Diagnosis not present

## 2015-10-20 DIAGNOSIS — Z7902 Long term (current) use of antithrombotics/antiplatelets: Secondary | ICD-10-CM | POA: Insufficient documentation

## 2015-10-20 DIAGNOSIS — R112 Nausea with vomiting, unspecified: Secondary | ICD-10-CM | POA: Diagnosis not present

## 2015-10-20 DIAGNOSIS — Z791 Long term (current) use of non-steroidal anti-inflammatories (NSAID): Secondary | ICD-10-CM | POA: Diagnosis not present

## 2015-10-20 DIAGNOSIS — Z9861 Coronary angioplasty status: Secondary | ICD-10-CM | POA: Diagnosis not present

## 2015-10-20 DIAGNOSIS — Z7982 Long term (current) use of aspirin: Secondary | ICD-10-CM | POA: Diagnosis not present

## 2015-10-20 DIAGNOSIS — Z8669 Personal history of other diseases of the nervous system and sense organs: Secondary | ICD-10-CM | POA: Insufficient documentation

## 2015-10-20 DIAGNOSIS — Z7951 Long term (current) use of inhaled steroids: Secondary | ICD-10-CM | POA: Diagnosis not present

## 2015-10-20 DIAGNOSIS — E669 Obesity, unspecified: Secondary | ICD-10-CM | POA: Diagnosis not present

## 2015-10-20 DIAGNOSIS — I10 Essential (primary) hypertension: Secondary | ICD-10-CM | POA: Diagnosis not present

## 2015-10-20 DIAGNOSIS — M1990 Unspecified osteoarthritis, unspecified site: Secondary | ICD-10-CM | POA: Insufficient documentation

## 2015-10-20 DIAGNOSIS — Z1231 Encounter for screening mammogram for malignant neoplasm of breast: Secondary | ICD-10-CM

## 2015-10-20 DIAGNOSIS — R42 Dizziness and giddiness: Secondary | ICD-10-CM | POA: Diagnosis present

## 2015-10-20 DIAGNOSIS — Z9889 Other specified postprocedural states: Secondary | ICD-10-CM | POA: Diagnosis not present

## 2015-10-20 DIAGNOSIS — K210 Gastro-esophageal reflux disease with esophagitis: Secondary | ICD-10-CM | POA: Diagnosis not present

## 2015-10-20 DIAGNOSIS — I2510 Atherosclerotic heart disease of native coronary artery without angina pectoris: Secondary | ICD-10-CM | POA: Insufficient documentation

## 2015-10-20 DIAGNOSIS — Z79899 Other long term (current) drug therapy: Secondary | ICD-10-CM | POA: Insufficient documentation

## 2015-10-20 LAB — I-STAT CHEM 8, ED
BUN: 22 mg/dL — ABNORMAL HIGH (ref 6–20)
Calcium, Ion: 1.21 mmol/L (ref 1.13–1.30)
Chloride: 104 mmol/L (ref 101–111)
Creatinine, Ser: 1.5 mg/dL — ABNORMAL HIGH (ref 0.44–1.00)
Glucose, Bld: 114 mg/dL — ABNORMAL HIGH (ref 65–99)
HCT: 37 % (ref 36.0–46.0)
Hemoglobin: 12.6 g/dL (ref 12.0–15.0)
Potassium: 3.6 mmol/L (ref 3.5–5.1)
Sodium: 142 mmol/L (ref 135–145)
TCO2: 23 mmol/L (ref 0–100)

## 2015-10-20 LAB — CBC WITH DIFFERENTIAL/PLATELET
Basophils Absolute: 0 10*3/uL (ref 0.0–0.1)
Basophils Relative: 0 %
Eosinophils Absolute: 0.1 10*3/uL (ref 0.0–0.7)
Eosinophils Relative: 1 %
HCT: 36.7 % (ref 36.0–46.0)
Hemoglobin: 12.3 g/dL (ref 12.0–15.0)
Lymphocytes Relative: 19 %
Lymphs Abs: 2 10*3/uL (ref 0.7–4.0)
MCH: 28.3 pg (ref 26.0–34.0)
MCHC: 33.5 g/dL (ref 30.0–36.0)
MCV: 84.6 fL (ref 78.0–100.0)
Monocytes Absolute: 0.7 10*3/uL (ref 0.1–1.0)
Monocytes Relative: 7 %
Neutro Abs: 7.7 10*3/uL (ref 1.7–7.7)
Neutrophils Relative %: 73 %
Platelets: 213 10*3/uL (ref 150–400)
RBC: 4.34 MIL/uL (ref 3.87–5.11)
RDW: 16 % — ABNORMAL HIGH (ref 11.5–15.5)
WBC: 10.6 10*3/uL — ABNORMAL HIGH (ref 4.0–10.5)

## 2015-10-20 LAB — BASIC METABOLIC PANEL
Anion gap: 9 (ref 5–15)
BUN: 24 mg/dL — ABNORMAL HIGH (ref 6–20)
CO2: 26 mmol/L (ref 22–32)
Calcium: 9.2 mg/dL (ref 8.9–10.3)
Chloride: 106 mmol/L (ref 101–111)
Creatinine, Ser: 1.8 mg/dL — ABNORMAL HIGH (ref 0.44–1.00)
GFR calc Af Amer: 30 mL/min — ABNORMAL LOW (ref >60)
GFR calc non Af Amer: 26 mL/min — ABNORMAL LOW (ref >60)
Glucose, Bld: 157 mg/dL — ABNORMAL HIGH (ref 65–99)
Potassium: 3.6 mmol/L (ref 3.5–5.1)
Sodium: 141 mmol/L (ref 135–145)

## 2015-10-20 MED ORDER — SODIUM CHLORIDE 0.9 % IV BOLUS (SEPSIS)
1000.0000 mL | Freq: Once | INTRAVENOUS | Status: AC
  Administered 2015-10-20: 1000 mL via INTRAVENOUS

## 2015-10-20 NOTE — ED Notes (Signed)
Drinking water without difficulty.

## 2015-10-20 NOTE — ED Notes (Signed)
Pt reports was getting a mammogram and became dizzy and vomited.  Pt was admitted to [facility] for dizziness and bradycardia.  Pt says dizziness has improved.  Denies any weakness, numbness, or difficulty speaking or swallowing.  Denies headache.

## 2015-10-20 NOTE — Discharge Instructions (Signed)
Do not take Lasix until you can follow up with Dr. Phillips Odor for repeat of your kidney function next week.  If you have shortness of breath, significant leg swelling or worsening cough before then return to the emergency department.

## 2015-10-20 NOTE — ED Provider Notes (Signed)
CSN: [id number]     Arrival date & time 10/20/15  [id number] History  By signing my name below, I, Doreatha MartinEva Mathews, attest that this documentation has been prepared under the direction and in the presence of Marily MemosJason Javier Gell, MD. Electronically Signed: Doreatha MartinEva Mathews, ED Scribe. 10/20/2015. 8:17 AM.    Chief Complaint  Patient presents with  . Dizziness   The history is provided by the patient and a relative. No language interpreter was used.   HPI Comments: Kamaiya Estill BambergM Gaxiola is a 82 y.o. female with h/o CAD, HTN, HLD, obesity, coronary angioplasty with stent, left heart cath with coronary angiogram who presents to the Emergency Department complaining of moderate, improving "lightheaded" dizziness onset this morning with associated transient nausea, one episode of emesis. Pt reports she was standing, getting a mammogram when she began to feel dizzy and nauseated. Daughter reports her emesis was NBNB, yellow and clear. H/o one episode of similar dizziness due to bradycardia, ~40bpm, 2 months ago, which resulted in hospitalization. Per daughter, they did not take her pulse during the episode today. Pt currently reports she feels well, other than some mild residual dizziness. She denies dizziness before today. No recent medication changes. She denies abdominal pain, dysuria, frequency.   Past Medical History  Diagnosis Date  . Coronary artery disease 11/2009    2D Echo EF>55  . Hypertension   . High cholesterol   . Reflux esophagitis   . Obesity   . Arthritis   . Sleep apnea 09/27/2010    Untreated, REM 64.7/hr AHI 18.5/hr RDI 19.2/hr. Patuent refused CPAP therapy   Past Surgical History  Procedure Laterality Date  . Cholecystectomy    . Coronary angioplasty with stent placement  2007    A 3.0x1318mm CYPHER stent post dilated to 3.29 mm the 100% occlusion was reduced to 0%  . Left heart catheterization with coronary angiogram N/A 07/12/2014    Procedure: LEFT HEART CATHETERIZATION WITH CORONARY ANGIOGRAM;   Surgeon: Micheline ChapmanMichael D Cooper, MD;  [facility];  Service: Cardiovascular;  Laterality: N/A;   Family History  Problem Relation Age of Onset  . Hypertension Other    Social History  Substance Use Topics  . Smoking status: Never Smoker   . Smokeless tobacco: Never Used  . Alcohol Use: No   OB History    No data available     Review of Systems  Gastrointestinal: Positive for nausea and vomiting. Negative for abdominal pain.  Genitourinary: Negative for dysuria and frequency.  Neurological: Positive for dizziness and light-headedness.  All other systems reviewed and are negative.  Allergies  Contrast media and Codeine  Home Medications   Prior to Admission medications   Medication Sig Start Date End Date Taking? Authorizing Provider  amLODipine (NORVASC) 10 MG tablet Take 0.5 tablets (5 mg total) by mouth daily. 09/02/15  Yes Auburn BilberryShreyang Patel, MD  aspirin 81 MG tablet Take 81 mg by mouth daily.   Yes Historical Provider, MD  carvedilol (COREG) 12.5 MG tablet Take 0.5 tablets (6.25 mg total) by mouth 2 (two) times daily with a meal. 09/02/15  Yes Auburn BilberryShreyang Patel, MD  chlorthalidone (HYGROTON) 25 MG tablet Take 0.5 tablets (12.5 mg total) by mouth daily. 09/02/15  Yes Auburn BilberryShreyang Patel, MD  clopidogrel (PLAVIX) 75 MG tablet TAKE 1 TABLET BY MOUTH EVERY DAY 09/13/15  Yes Lennette Biharihomas A Kelly, MD  fluticasone [facility](FLONASE) 50 MCG/ACT nasal spray Place 1 spray into the nose as needed for allergies or rhinitis.  12/03/12  Yes Historical  Provider, MD  furosemide (LASIX) 40 MG tablet TAKE 1 TABLET BY MOUTH DAILY AS NEEDED FOR EDEMA 10/10/15  Yes Lennette Bihari, MD  hydrALAZINE (APRESOLINE) 25 MG tablet Take 3 tablets (75 mg total) by mouth every 6 (six) hours. 09/02/15  Yes Auburn Bilberry, MD  Iron Succinyl-Protein Complex 40 MG/15ML SOLN Take 40 mg by mouth daily.   Yes Historical Provider, MD  losartan (COZAAR) 50 MG tablet Take 2 tablets (100 mg total) by mouth daily. 09/02/15  Yes Auburn Bilberry, MD   naproxen sodium (ANAPROX) 220 MG tablet Take 440 mg by mouth 2 (two) times daily with a meal.   Yes Historical Provider, MD  NEXIUM 40 MG capsule TAKE 1 CAPSULE (40 MG TOTAL) BY MOUTH DAILY BEFORE BREAKFAST. 03/01/15  Yes Lennette Bihari, MD  nitroGLYCERIN (NITROSTAT) 0.4 MG SL tablet Place 1 tablet (0.4 mg total) under the tongue every 5 (five) minutes as needed. 06/01/15  Yes Lennette Bihari, MD  rosuvastatin (CRESTOR) 40 MG tablet Take 1 tablet (40 mg total) by mouth daily. Patient taking differently: Take 40 mg by mouth every other day.  11/26/14  Yes Lennette Bihari, MD   BP 132/72 mmHg  Pulse 62  Temp(Src) 98.3 F (36.8 C) (Oral)  Resp 15  Ht 5\' 5"  (1.651 m)  Wt 204 lb (92.534 kg)  BMI 33.95 kg/m2  SpO2 100% Physical Exam  Constitutional: She is oriented to person, place, and time. She appears well-developed and well-nourished.  HENT:  Head: Normocephalic and atraumatic.  Nose: Nose normal.  Mouth/Throat: Uvula is midline, oropharynx is clear and moist and mucous membranes are normal. No oropharyngeal exudate, posterior oropharyngeal edema, posterior oropharyngeal erythema or tonsillar abscesses.  Eyes: Conjunctivae and EOM are normal. Pupils are equal, round, and reactive to light.  Eyes tracking normally.   Neck: Normal range of motion. Neck supple.  Cardiovascular: Normal rate, regular rhythm and normal heart sounds.  Exam reveals no gallop and no friction rub.   No murmur heard. Heart sounds normal. RRR.    Pulmonary/Chest: Effort normal and breath sounds normal. No respiratory distress. She has no wheezes. She has no rales.  Lungs CTA and equal bilaterally.   Abdominal: Soft. Bowel sounds are normal. She exhibits no distension and no mass. There is no tenderness. There is no rebound and no guarding.  Musculoskeletal: Normal range of motion. She exhibits no edema or tenderness.  No CVA or midline tenderness.   Neurological: She is alert and oriented to person, place, and time.  She has normal reflexes. She displays normal reflexes. No cranial nerve deficit. Coordination normal.  Normal finger to nose testing bilaterally. Strength and sensation equal and intact to bilateral upper and lower extremities. Cranial nerves 2-12 intact. Symmetric deep tendon reflexes in bicep and patellar tendons. Ambulation is baseline. No dizziness with positional changes.   Skin: Skin is warm and dry.  Psychiatric: She has a normal mood and affect. Her behavior is normal.  Nursing note and vitals reviewed.   ED Course  Procedures (including critical care time) DIAGNOSTIC STUDIES: Oxygen Saturation is 97% on RA, normal by my interpretation.    COORDINATION OF CARE: 8:15 AM Discussed treatment plan with pt at bedside which includes lab work and pt agreed to plan.   Labs Review Labs Reviewed  CBC WITH DIFFERENTIAL/PLATELET - Abnormal; Notable for the following:    WBC 10.6 (*)    RDW 16.0 (*)    All other components within normal limits  BASIC  METABOLIC PANEL - Abnormal; Notable for the following:    Glucose, Bld 157 (*)    BUN 24 (*)    Creatinine, Ser 1.80 (*)    GFR calc non Af Amer 26 (*)    GFR calc Af Amer 30 (*)    All other components within normal limits  I-STAT CHEM 8, ED - Abnormal; Notable for the following:    BUN 22 (*)    Creatinine, Ser 1.50 (*)    Glucose, Bld 114 (*)    All other components within normal limits   I have personally reviewed and evaluated these lab results as part of my medical decision-making.   EKG Interpretation None      MDM   Final diagnoses:  Dehydration   Here with 1 episode of dizziness and vomiting that has since resolved. Able to ambulate without difficulty. No neurologic changes. Initial creatinine slightly elevated so given a fluid bolus and some by mouth hydration with subsequent improvement reinforcing likely dx of dehydration as cause for her s ymptoms. I doubt intracranial causes, no e/o bradycardia or other cardiac  causes like she had last time. I suspect she will continue to improve with fluid hydration at home. She will follow with her doctor on Monday to get a repeat creatinine.   I personally performed the services described in this documentation, which was scribed in my presence. The recorded information has been reviewed and is accurate    Marily Memos, MD 10/20/15 1551

## 2015-10-20 NOTE — ED Notes (Signed)
Drank cup of water without difficulty.  Denies any dizziness at this time.

## 2015-10-27 ENCOUNTER — Other Ambulatory Visit: Payer: Self-pay | Admitting: Cardiovascular Disease

## 2015-10-27 NOTE — Telephone Encounter (Signed)
Rx request sent to pharmacy.  

## 2015-11-03 ENCOUNTER — Ambulatory Visit (INDEPENDENT_AMBULATORY_CARE_PROVIDER_SITE_OTHER): Payer: Medicare Other | Admitting: Cardiovascular Disease

## 2015-11-03 ENCOUNTER — Encounter: Payer: Self-pay | Admitting: Cardiovascular Disease

## 2015-11-03 VITALS — BP 104/76 | HR 76 | Ht 65.0 in | Wt 204.4 lb

## 2015-11-03 DIAGNOSIS — N289 Disorder of kidney and ureter, unspecified: Secondary | ICD-10-CM

## 2015-11-03 DIAGNOSIS — M25562 Pain in left knee: Secondary | ICD-10-CM | POA: Diagnosis not present

## 2015-11-03 DIAGNOSIS — M25561 Pain in right knee: Secondary | ICD-10-CM | POA: Diagnosis not present

## 2015-11-03 DIAGNOSIS — M170 Bilateral primary osteoarthritis of knee: Secondary | ICD-10-CM | POA: Diagnosis not present

## 2015-11-03 DIAGNOSIS — I2510 Atherosclerotic heart disease of native coronary artery without angina pectoris: Secondary | ICD-10-CM | POA: Diagnosis not present

## 2015-11-03 DIAGNOSIS — R262 Difficulty in walking, not elsewhere classified: Secondary | ICD-10-CM | POA: Diagnosis not present

## 2015-11-03 DIAGNOSIS — I10 Essential (primary) hypertension: Secondary | ICD-10-CM | POA: Diagnosis not present

## 2015-11-03 DIAGNOSIS — E669 Obesity, unspecified: Secondary | ICD-10-CM

## 2015-11-03 DIAGNOSIS — D649 Anemia, unspecified: Secondary | ICD-10-CM

## 2015-11-03 DIAGNOSIS — E785 Hyperlipidemia, unspecified: Secondary | ICD-10-CM

## 2015-11-03 DIAGNOSIS — I452 Bifascicular block: Secondary | ICD-10-CM

## 2015-11-03 DIAGNOSIS — Z9861 Coronary angioplasty status: Secondary | ICD-10-CM

## 2015-11-03 DIAGNOSIS — G4733 Obstructive sleep apnea (adult) (pediatric): Secondary | ICD-10-CM

## 2015-11-03 MED ORDER — LOSARTAN POTASSIUM 50 MG PO TABS: 50.0000 mg | ORAL_TABLET | Freq: Every day | ORAL | Status: DC

## 2015-11-03 NOTE — Progress Notes (Signed)
Patient ID: [name], female   DOB: 10/17/1933, 82 y.o.   MRN: [id number]     HPI: [name] is a 82 y.o. female who presents to the office today for a 6 month follow up cardiology evaluation.  [name] has a history of known CAD, hypertension, obesity, hyperlipidemia, as well as untreated sleep apnea, for which he has refused CPAP therapy.  She suffered an acute coronary syndrome and underwent PCI to a totally occluded ramus intermediate vessel in 2007.  She had concomitant CAD with 60% distal circumflex stenosis and mid LAD myocardial muscle bridging.  Remotely, she had undergone stenting to RCA. A stress test in December 2012 showed a mild to moderate defect in the basal anterolateral region without significant ischemia with breast attenuation.   She has a history of moderate obstructive sleep apnea with a overall AHI at 18.5 per hour but during grams sleep.  This was severe at 64.7 per hour noted in January 2012.  She has refused CPAP therapy.  A followup nuclear perfusion study on 01/20/2014 remained  low risk and showed mild to moderate basal, mid, anterolateral scar without reversible ischemia, unchanged from her prior study.  In June 2015, I titrated her amlodipine for more optimal blood pressure control.  On 07/10/2014 , she was admitted to the hospital with chest pressure.  Cardiac enzymes were cycled and were negative 3.  She underwent cardiac catheterization which with showed patent stents in the ramus and RCA.  Ejection fraction was 75%.  She was started on carvedilol 12.5 mg twice a day and continued on aspirin, Plavix, Crestor 20 mg, and amlodipine 10 mg.  Since I last saw her in September 2016 she denies recurrent chest pain.  In December she was admitted to [facility] with apparent syncopal spell and was bradycardic with pulse in the 40s to 50s. Her medications were adjusted during that hospitalization.  Her heart rate improved.  Her blood pressure remained elevated  and as result hydralazine was added to her medical regimen as well as chlorthalidone.  She also states her losartan was increased to 100 mg.  She saw Kerin Ransom, [facility] on 09/06/2015 for follow-up evaluation.  During that hospitalization, her serum creatinine was 0.91, and on December 22 was 0.76.  A repeat echo Doppler study was done on 08/29/2015 and showed mild concentric LVH with normal systolic function and grade 1 diastolic dysfunction.  There was mild AR and mild MR.  She tells me she subsequent was evaluated in the emergency room at [facility] on February 9 after having some episodes of diarrhea and was dehydrated.  Laboratory from that evaluation revealed a creatinine of 1.80, and on repeat later that day.  Fluids was 1.50.  Her medications were not changed.  She presents for evaluation.  Present, she denies any episodes of chest pain.  She denies PND, orthopnea.  He has a history of anemia and has been on iron complex solution. She admits to significant pain in her knees bilaterally due to arthritic bone-on-bone condition.   She presents for follow-up evaluation.  Past Medical History  Diagnosis Date  . Coronary artery disease 11/2009    2D Echo EF>55  . Hypertension   . High cholesterol   . Reflux esophagitis   . Obesity   . Arthritis   . Sleep apnea 09/27/2010    Untreated, REM 64.7/hr AHI 18.5/hr RDI 19.2/hr. Patuent refused CPAP therapy    Past Surgical History  Procedure Laterality Date  .  Cholecystectomy    . Coronary angioplasty with stent placement  2007    A 3.0x53m CYPHER stent post dilated to 3.29 mm the 100% occlusion was reduced to 0%  . Left heart catheterization with coronary angiogram N/A 07/12/2014    Procedure: LEFT HEART CATHETERIZATION WITH CORONARY ANGIOGRAM;  Surgeon: MBlane Ohara MD;  [facility];  Service: Cardiovascular;  Laterality: N/A;    Allergies  Allergen Reactions  . Contrast Media [Iodinated Diagnostic Agents] Nausea And Vomiting    . Codeine Nausea And Vomiting and Anxiety    Current Outpatient Prescriptions  Medication Sig Dispense Refill  . aspirin 81 MG tablet Take 81 mg by mouth daily.    . carvedilol (COREG) 12.5 MG tablet Take 0.5 tablets (6.25 mg total) by mouth 2 (two) times daily with a meal. 90 tablet 6  . chlorthalidone (HYGROTON) 25 MG tablet Take 0.5 tablets (12.5 mg total) by mouth daily. 30 tablet 0  . clopidogrel (PLAVIX) 75 MG tablet TAKE 1 TABLET BY MOUTH EVERY DAY 30 tablet 2  . fluticasone (FLONASE) 50 MCG/ACT nasal spray Place 1 spray into the nose as needed for allergies or rhinitis.     . furosemide (LASIX) 40 MG tablet TAKE 1 TABLET BY MOUTH DAILY AS NEEDED FOR EDEMA 30 tablet 0  . hydrALAZINE (APRESOLINE) 25 MG tablet Take 3 tablets (75 mg total) by mouth every 6 (six) hours. 120 tablet 0  . Iron Succinyl-Protein Complex 40 MG/15ML SOLN Take 40 mg by mouth daily.    .Marland Kitchenlosartan (COZAAR) 50 MG tablet Take 1 tablet (50 mg total) by mouth daily. 30 tablet 6  . naproxen sodium (ANAPROX) 220 MG tablet Take 440 mg by mouth 2 (two) times daily with a meal.    . NEXIUM 40 MG capsule TAKE 1 CAPSULE (40 MG TOTAL) BY MOUTH DAILY BEFORE BREAKFAST. 30 capsule 9  . nitroGLYCERIN (NITROSTAT) 0.4 MG SL tablet Place 1 tablet (0.4 mg total) under the tongue every 5 (five) minutes as needed. 25 tablet 3  . rosuvastatin (CRESTOR) 40 MG tablet Take 1 tablet (40 mg total) by mouth daily. (Patient taking differently: Take 40 mg by mouth every other day. ) 90 tablet 3  . amLODipine (NORVASC) 5 MG tablet Take 5 mg by mouth daily. Take 1 tab daily  3   No current facility-administered medications for this visit.    Social History   Social History  . Marital Status: Single    Spouse Name: N/A  . Number of Children: N/A  . Years of Education: N/A   Occupational History  . Not on file.   Social History Main Topics  . Smoking status: Never Smoker   . Smokeless tobacco: Never Used  . Alcohol Use: No  . Drug  Use: No  . Sexual Activity: Not on file   Other Topics Concern  . Not on file   Social History Narrative   Socially, she has 9 children, and 7 grandchildren, and 2 great-grandchildren.  There is no tobacco or alcohol use.  Family History  Problem Relation Age of Onset  . Hypertension Other     ROS General: Negative; No fevers, chills, or night sweats HEENT: Negative; No changes in vision or hearing, sinus congestion, difficulty swallowing Pulmonary: Negative; No cough, wheezing, shortness of breath, hemoptysis Cardiovascular: See HPI:  GI: Negative; No nausea, vomiting, diarrhea, or abdominal pain GU: Negative; No dysuria, hematuria, or difficulty voiding Musculoskeletal: Positive for bilateral knee discomfort Hematologic: History of anemia  no easy bruising, bleeding Endocrine: Negative; no heat/cold intolerance; no diabetes, Neuro: Negative; no changes in balance, headaches Skin: Negative; No rashes or skin lesions Psychiatric: Negative; No behavioral problems, depression Sleep: Positive for sleep apnea , refused CPAP therapy, no hypersomnolence, bruxism, restless legs, hypnogognic hallucinations. Other comprehensive 14 point system review is negative   Physical Exam BP 104/76 mmHg  Pulse 76  Ht '5\' 5"'  (1.651 m)  Wt 204 lb 7 oz (92.732 kg)  BMI 34.02 kg/m2  Repeat blood pressure by me 112/80.  Wt Readings from Last 3 Encounters:  11/03/15 204 lb 7 oz (92.732 kg)  10/20/15 204 lb (92.534 kg)  09/06/15 205 lb 12.8 oz (93.35 kg)   General: Alert, oriented, no distress.  Skin: normal turgor, no rashes, warm and dry HEENT: Normocephalic, atraumatic. Pupils equal round and reactive to light; sclera anicteric; extraocular muscles intact, No lid lag; Nose without nasal septal hypertrophy; Mouth/Parynx benign; Mallinpatti scale 3/4 Neck: No JVD, no carotid bruits; normal carotid upstroke Lungs: clear to ausculatation and percussion bilaterally; no wheezing or rales, normal  inspiratory and expiratory effort Chest wall: without tenderness to palpitation Heart: PMI not displaced, RRR, s1 s2 normal, 1/6 systolic murmur, No diastolic murmur, no rubs, gallops, thrills, or heaves Abdomen: Central adiposity; soft, nontender; no hepatosplenomehaly, BS+; abdominal aorta nontender and not dilated by palpation. Back: no CVA tenderness Pulses: 2+  Musculoskeletal: full range of motion, normal strength, no joint deformities Extremities: Pulses 2+, no clubbing cyanosis or edema, Homan's sign negative  Neurologic: grossly nonfocal; Cranial nerves grossly wnl Psychologic: Normal mood and affect  ECG (independently read by me): Normal sinus rhythm with right bundle branch block and repolarization changes.  QTC 42 [name].  ECG (independently read by me): Normal sinus rhythm at 66 bpm.  Right bundle-branch block with repolarization changes.  Left anterior hemiblock.  No significant ST segment changes other than repolarization abnormalities and right bundle branch block leads.  December 2015 ECG (independently read by me): Normal sinus rhythm at 65 bpm.  Right bundle-branch block with repolarization changes.  PR interval 182 [name].  QTc interval 484 [name].   LABS:  BMP Latest Ref Rng 10/20/2015 10/20/2015 09/01/2015  Glucose 65 - 99 mg/dL 114(H) 157(H) -  BUN 6 - 20 mg/dL 22(H) 24(H) -  Creatinine 0.44 - 1.00 mg/dL 1.50(H) 1.80(H) 0.76  Sodium 135 - 145 mmol/L 142 141 -  Potassium 3.5 - 5.1 mmol/L 3.6 3.6 -  Chloride 101 - 111 mmol/L 104 106 -  CO2 22 - 32 mmol/L - 26 -  Calcium 8.9 - 10.3 mg/dL - 9.2 -    Hepatic Function Latest Ref Rng 01/19/2015 11/16/2014 07/11/2014  Total Protein 6.0 - 8.5 g/dL 6.7 6.3 6.3  Albumin 3.5 - 4.8 g/dL 3.8 3.6 3.0(L)  AST 0 - 40 IU/L '12 9 13  ' ALT 0 - 32 IU/L '7 6 11  ' Alk Phosphatase 39 - 117 IU/L 66 75 74  Total Bilirubin 0.0 - 1.2 mg/dL 0.3 0.3 0.2(L)    CBC Latest Ref Rng 10/20/2015 10/20/2015 08/29/2015  WBC 4.0 - 10.5 K/uL - 10.6(H) 7.0  Hemoglobin  12.0 - 15.0 g/dL 12.6 12.3 11.7(L)  Hematocrit 36.0 - 46.0 % 37.0 36.7 36.1  Platelets 150 - 400 K/uL - 213 152   Lab Results  Component Value Date   MCV 84.6 10/20/2015   MCV 84.7 08/29/2015   MCV 85.3 08/28/2015   Lab Results  Component Value Date   TSH 1.437 08/28/2015   Lab  Results  Component Value Date   HGBA1C 6.0* 03/02/2014       Component Value Date/Time   PROBNP 67.9 12/01/2011 1035   Lipid Panel     Component Value Date/Time   CHOL 146 01/19/2015 1048   CHOL 171 03/02/2014 1057   TRIG 104 01/19/2015 1048   HDL 48 01/19/2015 1048   HDL 56 03/02/2014 1057   CHOLHDL 3.1 03/02/2014 1057   VLDL 17 03/02/2014 1057   LDLCALC 77 01/19/2015 1048   LDLCALC 98 03/02/2014 1057     RADIOLOGY: No results found.    ASSESSMENT AND PLAN: [name]. Carrolyn Leigh is a 82 year old female who has established CAD who suffered an acute coronary syndrome in 2007 and underwent PCI to a totally occluded ramus intermediate vessel.  Remotely, she had undergone prior stenting to her RCA and had been demonstrated to have concomitant CAD. Her most recent cardiac catheterization  revealed patent LAD and RCA stents.  The circumflex had stable 50% stenosis in the distal AV groove before the left PDA which was unchanged from 2007.  She also was noted to have an intramyocardial bridge in the mid LAD.  I reviewed her hospital records from her [facility] admission in December, the note from Kerin Ransom in December, and her recent [facility] emergency room evaluation earlier this month.  It appears that she has developed chronic kidney disease and has been on an increased dose of losartan , which may have been exacerbated by dehydration.  With her blood pressure now normal and the fact that she is on hydralazine 75 mg every 6 hours  I am reducing her losartan from 100 mg to 50 mg.  She continues to take chlorthalidone at 12.5 mg daily and amlodipine 5 mg for blood pressure control.  She continues to be on  dual antiplatelet therapy with aspirin and Plavix.  She has been taking Lasix on an as-needed basis but has not taken this since her emergency room evaluation when she was dehydrated.  She has GERD and this is been stable on Nexium.  She continues to be on Crestor for hyperlipidemia with target LDL less than 70.  In 2 weeks she will undergo a follow-up comprehensive metabolic panel, CBC, and since she continues to be on iron solution.  I will check a ferritin level.  I will see her in 3 months for cardiology reevaluation.   Time spent: 25 minutes  Troy Sine, MD, [facility]  11/03/2015 12:38 PM

## 2015-11-03 NOTE — Patient Instructions (Addendum)
Your physician has recommended you make the following change in your medication: the losartan has been decreased to 50 mg . A new prescription has been sent to the pharmacy.  Your physician recommends that you return for lab work in: 2 weeks. THIS DOES NOT NEED TO BE FASTING.  Your physician recommends that you schedule a follow-up appointment in: 3 MONTHS WITH DR Tresa Endo.

## 2015-11-08 DIAGNOSIS — M25562 Pain in left knee: Secondary | ICD-10-CM | POA: Diagnosis not present

## 2015-11-08 DIAGNOSIS — M170 Bilateral primary osteoarthritis of knee: Secondary | ICD-10-CM | POA: Diagnosis not present

## 2015-11-08 DIAGNOSIS — M1711 Unilateral primary osteoarthritis, right knee: Secondary | ICD-10-CM | POA: Diagnosis not present

## 2015-11-08 DIAGNOSIS — R2689 Other abnormalities of gait and mobility: Secondary | ICD-10-CM | POA: Diagnosis not present

## 2015-11-08 DIAGNOSIS — M25561 Pain in right knee: Secondary | ICD-10-CM | POA: Diagnosis not present

## 2015-11-10 DIAGNOSIS — M25562 Pain in left knee: Secondary | ICD-10-CM | POA: Diagnosis not present

## 2015-11-10 DIAGNOSIS — M1712 Unilateral primary osteoarthritis, left knee: Secondary | ICD-10-CM | POA: Diagnosis not present

## 2015-11-15 DIAGNOSIS — M1711 Unilateral primary osteoarthritis, right knee: Secondary | ICD-10-CM | POA: Diagnosis not present

## 2015-11-15 DIAGNOSIS — M170 Bilateral primary osteoarthritis of knee: Secondary | ICD-10-CM | POA: Diagnosis not present

## 2015-11-15 DIAGNOSIS — M25561 Pain in right knee: Secondary | ICD-10-CM | POA: Diagnosis not present

## 2015-11-15 DIAGNOSIS — M25562 Pain in left knee: Secondary | ICD-10-CM | POA: Diagnosis not present

## 2015-11-15 DIAGNOSIS — R2689 Other abnormalities of gait and mobility: Secondary | ICD-10-CM | POA: Diagnosis not present

## 2015-11-21 DIAGNOSIS — M170 Bilateral primary osteoarthritis of knee: Secondary | ICD-10-CM | POA: Diagnosis not present

## 2015-11-21 DIAGNOSIS — R2689 Other abnormalities of gait and mobility: Secondary | ICD-10-CM | POA: Diagnosis not present

## 2015-11-21 DIAGNOSIS — M25562 Pain in left knee: Secondary | ICD-10-CM | POA: Diagnosis not present

## 2015-11-21 DIAGNOSIS — M1712 Unilateral primary osteoarthritis, left knee: Secondary | ICD-10-CM | POA: Diagnosis not present

## 2015-11-21 DIAGNOSIS — M25561 Pain in right knee: Secondary | ICD-10-CM | POA: Diagnosis not present

## 2015-11-28 DIAGNOSIS — M25561 Pain in right knee: Secondary | ICD-10-CM | POA: Diagnosis not present

## 2015-11-28 DIAGNOSIS — M1711 Unilateral primary osteoarthritis, right knee: Secondary | ICD-10-CM | POA: Diagnosis not present

## 2015-11-30 DIAGNOSIS — M25561 Pain in right knee: Secondary | ICD-10-CM | POA: Diagnosis not present

## 2015-11-30 DIAGNOSIS — M1712 Unilateral primary osteoarthritis, left knee: Secondary | ICD-10-CM | POA: Diagnosis not present

## 2015-11-30 DIAGNOSIS — R2689 Other abnormalities of gait and mobility: Secondary | ICD-10-CM | POA: Diagnosis not present

## 2015-11-30 DIAGNOSIS — M25562 Pain in left knee: Secondary | ICD-10-CM | POA: Diagnosis not present

## 2015-11-30 DIAGNOSIS — M170 Bilateral primary osteoarthritis of knee: Secondary | ICD-10-CM | POA: Diagnosis not present

## 2015-12-05 DIAGNOSIS — R2689 Other abnormalities of gait and mobility: Secondary | ICD-10-CM | POA: Diagnosis not present

## 2015-12-05 DIAGNOSIS — M25562 Pain in left knee: Secondary | ICD-10-CM | POA: Diagnosis not present

## 2015-12-05 DIAGNOSIS — M170 Bilateral primary osteoarthritis of knee: Secondary | ICD-10-CM | POA: Diagnosis not present

## 2015-12-05 DIAGNOSIS — M25561 Pain in right knee: Secondary | ICD-10-CM | POA: Diagnosis not present

## 2015-12-06 ENCOUNTER — Other Ambulatory Visit: Payer: Self-pay | Admitting: Cardiovascular Disease

## 2015-12-06 NOTE — Telephone Encounter (Signed)
REFILL 

## 2015-12-07 DIAGNOSIS — M170 Bilateral primary osteoarthritis of knee: Secondary | ICD-10-CM | POA: Diagnosis not present

## 2015-12-07 DIAGNOSIS — R2689 Other abnormalities of gait and mobility: Secondary | ICD-10-CM | POA: Diagnosis not present

## 2015-12-07 DIAGNOSIS — M25562 Pain in left knee: Secondary | ICD-10-CM | POA: Diagnosis not present

## 2015-12-07 DIAGNOSIS — M25561 Pain in right knee: Secondary | ICD-10-CM | POA: Diagnosis not present

## 2015-12-13 DIAGNOSIS — M25562 Pain in left knee: Secondary | ICD-10-CM | POA: Diagnosis not present

## 2015-12-13 DIAGNOSIS — R2689 Other abnormalities of gait and mobility: Secondary | ICD-10-CM | POA: Diagnosis not present

## 2015-12-13 DIAGNOSIS — M25561 Pain in right knee: Secondary | ICD-10-CM | POA: Diagnosis not present

## 2015-12-13 DIAGNOSIS — M170 Bilateral primary osteoarthritis of knee: Secondary | ICD-10-CM | POA: Diagnosis not present

## 2015-12-16 DIAGNOSIS — R109 Unspecified abdominal pain: Secondary | ICD-10-CM | POA: Diagnosis not present

## 2015-12-16 DIAGNOSIS — N342 Other urethritis: Secondary | ICD-10-CM | POA: Diagnosis not present

## 2015-12-16 DIAGNOSIS — Z1389 Encounter for screening for other disorder: Secondary | ICD-10-CM | POA: Diagnosis not present

## 2015-12-16 DIAGNOSIS — Z6833 Body mass index (BMI) 33.0-33.9, adult: Secondary | ICD-10-CM | POA: Diagnosis not present

## 2015-12-19 DIAGNOSIS — R2689 Other abnormalities of gait and mobility: Secondary | ICD-10-CM | POA: Diagnosis not present

## 2015-12-19 DIAGNOSIS — M25561 Pain in right knee: Secondary | ICD-10-CM | POA: Diagnosis not present

## 2015-12-19 DIAGNOSIS — M170 Bilateral primary osteoarthritis of knee: Secondary | ICD-10-CM | POA: Diagnosis not present

## 2015-12-19 DIAGNOSIS — M25562 Pain in left knee: Secondary | ICD-10-CM | POA: Diagnosis not present

## 2015-12-26 DIAGNOSIS — M25561 Pain in right knee: Secondary | ICD-10-CM | POA: Diagnosis not present

## 2015-12-26 DIAGNOSIS — M170 Bilateral primary osteoarthritis of knee: Secondary | ICD-10-CM | POA: Diagnosis not present

## 2015-12-26 DIAGNOSIS — R2689 Other abnormalities of gait and mobility: Secondary | ICD-10-CM | POA: Diagnosis not present

## 2015-12-26 DIAGNOSIS — M25562 Pain in left knee: Secondary | ICD-10-CM | POA: Diagnosis not present

## 2016-01-06 ENCOUNTER — Other Ambulatory Visit: Payer: Self-pay | Admitting: Cardiovascular Disease

## 2016-01-06 NOTE — Telephone Encounter (Signed)
Rx(s) sent to pharmacy electronically.  

## 2016-01-27 ENCOUNTER — Ambulatory Visit (INDEPENDENT_AMBULATORY_CARE_PROVIDER_SITE_OTHER): Payer: Medicare Other | Admitting: Cardiovascular Disease

## 2016-01-27 ENCOUNTER — Encounter: Payer: Self-pay | Admitting: Cardiovascular Disease

## 2016-01-27 VITALS — BP 149/64 | HR 61 | Ht 60.0 in | Wt 200.4 lb

## 2016-01-27 DIAGNOSIS — G4733 Obstructive sleep apnea (adult) (pediatric): Secondary | ICD-10-CM

## 2016-01-27 DIAGNOSIS — I2510 Atherosclerotic heart disease of native coronary artery without angina pectoris: Secondary | ICD-10-CM | POA: Diagnosis not present

## 2016-01-27 DIAGNOSIS — E785 Hyperlipidemia, unspecified: Secondary | ICD-10-CM

## 2016-01-27 DIAGNOSIS — Z9861 Coronary angioplasty status: Secondary | ICD-10-CM

## 2016-01-27 DIAGNOSIS — E669 Obesity, unspecified: Secondary | ICD-10-CM | POA: Diagnosis not present

## 2016-01-27 DIAGNOSIS — I10 Essential (primary) hypertension: Secondary | ICD-10-CM | POA: Diagnosis not present

## 2016-01-27 DIAGNOSIS — N289 Disorder of kidney and ureter, unspecified: Secondary | ICD-10-CM

## 2016-01-27 MED ORDER — HYDRALAZINE HCL 100 MG PO TABS
100.0000 mg | ORAL_TABLET | Freq: Three times a day (TID) | ORAL | Status: DC
Start: 2016-01-27 — End: 2016-11-20

## 2016-01-27 NOTE — Patient Instructions (Signed)
Your physician wants you to follow-up in: 6 months or sooner if needed. You will receive a reminder letter in the mail two months in advance. If you don't receive a letter, please call our office to schedule the follow-up appointment.   If you need a refill on your cardiac medications before your next appointment, please call your pharmacy. 

## 2016-01-27 NOTE — Progress Notes (Signed)
Patient ID: [name], female   DOB: 1934/04/26, 82 y.o.   MRN: [id number]     HPI: [name] is a 82 y.o. female who presents to the office today for a 3 month follow up cardiology evaluation.  [name] has a history of known CAD, hypertension, obesity, hyperlipidemia, as well as untreated sleep apnea, for which he has refused CPAP therapy.  She suffered an acute coronary syndrome and underwent PCI to a totally occluded ramus intermediate vessel in 2007.  She had concomitant CAD with 60% distal circumflex stenosis and mid LAD myocardial muscle bridging.  Remotely, she had undergone stenting to RCA. A stress test in December 2012 showed a mild to moderate defect in the basal anterolateral region without significant ischemia with breast attenuation.   She has a history of moderate obstructive sleep apnea with a overall AHI at 18.5 per hour but during REM sleep.  This was severe at 64.7 per hour noted in January 2012.  She has refused CPAP therapy.  A followup nuclear perfusion study on 01/20/2014 remained  low risk and showed mild to moderate basal, mid, anterolateral scar without reversible ischemia, unchanged from her prior study.  In June 2015, I titrated her amlodipine for more optimal blood pressure control.  On 07/10/2014 , she was admitted to the hospital with chest pressure.  Cardiac enzymes were cycled and were negative 3.  She underwent cardiac catheterization which with showed patent stents in the ramus and RCA.  Ejection fraction was 75%.  She was started on carvedilol 12.5 mg twice a day and continued on aspirin, Plavix, Crestor 20 mg, and amlodipine 10 mg.  In December 2016 she was admitted to [facility] with apparent syncopal spell and was bradycardic with pulse in the 40s to 50s. Her medications were adjusted during that hospitalization.  Her heart rate improved.  Her blood pressure remained elevated and as result hydralazine was added to her medical regimen as well as  chlorthalidone.  She also states her losartan was increased to 100 mg.  She saw [name], [facility] on 09/06/2015 for follow-up evaluation.  During that hospitalization, her serum creatinine was 0.91, and on December 22 was 0.76.  A repeat echo Doppler study was done on 08/29/2015 and showed mild concentric LVH with normal systolic function and grade 1 diastolic dysfunction.  There was mild AR and mild MR.  She tells me she subsequent was evaluated in the emergency room at [facility] on February 9 after having some episodes of diarrhea and was dehydrated.  Laboratory from that evaluation revealed a creatinine of 1.80, and on repeat later that day following fluids was 1.50.  Her medications were not changed.  She presents for evaluation.  When I last saw her, her blood pressure had improved and she was on hydralazine 75 mg every 6 hours.  I reduced her losartan from 100 mg to 50 mg.  Over the past several months she has felt well.  She has been taking amlodipine 5 mg, carvedilol 6.25 mg twice a day, hydralazine 75 mg every 6 hours in addition to losartan 50 daily for blood pressure control.  She has continued on Crestor 40 mg daily for hyperlipidemia.  She continues to be on dual antiplatelet therapy.  She has been on Nexium for GERD.  She presents for follow-up evaluation.  Past Medical History  Diagnosis Date  . Coronary artery disease 11/2009    2D Echo EF>55  . Hypertension   . High cholesterol   .  Reflux esophagitis   . Obesity   . Arthritis   . Sleep apnea 09/27/2010    Untreated, REM 64.7/hr AHI 18.5/hr RDI 19.2/hr. Patuent refused CPAP therapy    Past Surgical History  Procedure Laterality Date  . Cholecystectomy    . Coronary angioplasty with stent placement  2007    A 3.0x34m CYPHER stent post dilated to 3.29 mm the 100% occlusion was reduced to 0%  . Left heart catheterization with coronary angiogram N/A 07/12/2014    Procedure: LEFT HEART CATHETERIZATION WITH CORONARY ANGIOGRAM;   Surgeon: MBlane Ohara MD;  [facility];  Service: Cardiovascular;  Laterality: N/A;    Allergies  Allergen Reactions  . Contrast Media [Iodinated Diagnostic Agents] Nausea And Vomiting  . Codeine Nausea And Vomiting and Anxiety    Current Outpatient Prescriptions  Medication Sig Dispense Refill  . amLODipine (NORVASC) 5 MG tablet Take 5 mg by mouth daily. Take 1 tab daily  3  . aspirin 81 MG tablet Take 81 mg by mouth daily.    . carvedilol (COREG) 12.5 MG tablet Take 0.5 tablets (6.25 mg total) by mouth 2 (two) times daily with a meal. 90 tablet 6  . chlorthalidone (HYGROTON) 25 MG tablet Take 0.5 tablets (12.5 mg total) by mouth daily. 30 tablet 0  . clopidogrel (PLAVIX) 75 MG tablet TAKE 1 TABLET BY MOUTH EVERY DAY 30 tablet 2  . esomeprazole (NEXIUM) 40 MG capsule TAKE 1 CAPSULE (40 MG TOTAL) BY MOUTH DAILY BEFORE BREAKFAST. 30 capsule 10  . fluticasone (FLONASE) 50 MCG/ACT nasal spray Place 1 spray into the nose as needed for allergies or rhinitis.     . furosemide (LASIX) 40 MG tablet TAKE 1 TABLET BY MOUTH DAILY AS NEEDED FOR EDEMA 30 tablet 0  . Iron Succinyl-Protein Complex 40 MG/15ML SOLN Take 40 mg by mouth daily.    .Marland Kitchenlosartan (COZAAR) 50 MG tablet Take 1 tablet (50 mg total) by mouth daily. 30 tablet 6  . naproxen sodium (ANAPROX) 220 MG tablet Take 440 mg by mouth 2 (two) times daily with a meal.    . nitroGLYCERIN (NITROSTAT) 0.4 MG SL tablet Place 1 tablet (0.4 mg total) under the tongue every 5 (five) minutes as needed. 25 tablet 3  . rosuvastatin (CRESTOR) 40 MG tablet Take 1 tablet (40 mg total) by mouth daily. (Patient taking differently: Take 40 mg by mouth every other day. ) 90 tablet 3  . hydrALAZINE (APRESOLINE) 100 MG tablet Take 1 tablet (100 mg total) by mouth 3 (three) times daily. 90 tablet 11   No current facility-administered medications for this visit.    Social History   Social History  . Marital Status: Single    Spouse Name: N/A  .  Number of Children: N/A  . Years of Education: N/A   Occupational History  . Not on file.   Social History Main Topics  . Smoking status: Never Smoker   . Smokeless tobacco: Never Used  . Alcohol Use: No  . Drug Use: No  . Sexual Activity: Not on file   Other Topics Concern  . Not on file   Social History Narrative   Socially, she has 9 children, and 7 grandchildren, and 2 great-grandchildren.  There is no tobacco or alcohol use.  Family History  Problem Relation Age of Onset  . Hypertension Other     ROS General: Negative; No fevers, chills, or night sweats HEENT: Negative; No changes in vision or hearing, sinus congestion,  difficulty swallowing Pulmonary: Negative; No cough, wheezing, shortness of breath, hemoptysis Cardiovascular: See HPI:  GI: Negative; No nausea, vomiting, diarrhea, or abdominal pain GU: Negative; No dysuria, hematuria, or difficulty voiding Musculoskeletal: Positive for bilateral knee discomfort Hematologic: History of anemia no easy bruising, bleeding Endocrine: Negative; no heat/cold intolerance; no diabetes, Neuro: Negative; no changes in balance, headaches Skin: Negative; No rashes or skin lesions Psychiatric: Negative; No behavioral problems, depression Sleep: Positive for sleep apnea , refused CPAP therapy, no hypersomnolence, bruxism, restless legs, hypnogognic hallucinations. Other comprehensive 14 point system review is negative   Physical Exam BP 149/64 mmHg  Pulse 61  Ht 5' (1.524 m)  Wt 200 lb 6.4 oz (90.901 kg)  BMI 39.14 kg/m2  Repeat blood pressure by me 136/68  Wt Readings from Last 3 Encounters:  01/27/16 200 lb 6.4 oz (90.901 kg)  11/03/15 204 lb 7 oz (92.732 kg)  10/20/15 204 lb (92.534 kg)   General: Alert, oriented, no distress.  Skin: normal turgor, no rashes, warm and dry HEENT: Normocephalic, atraumatic. Pupils equal round and reactive to light; sclera anicteric; extraocular muscles intact, No lid lag; Nose  without nasal septal hypertrophy; Mouth/Parynx benign; Mallinpatti scale 3/4 Neck: No JVD, no carotid bruits; normal carotid upstroke Lungs: clear to ausculatation and percussion bilaterally; no wheezing or rales, normal inspiratory and expiratory effort Chest wall: without tenderness to palpitation Heart: PMI not displaced, RRR, s1 s2 normal, 1/6 systolic murmur, No diastolic murmur, no rubs, gallops, thrills, or heaves Abdomen: Central adiposity; soft, nontender; no hepatosplenomehaly, BS+; abdominal aorta nontender and not dilated by palpation. Back: no CVA tenderness Pulses: 2+  Musculoskeletal: full range of motion, normal strength, no joint deformities Extremities: Pulses 2+, no clubbing cyanosis or edema, Homan's sign negative  Neurologic: grossly nonfocal; Cranial nerves grossly wnl Psychologic: Normal mood and affect  ECG (independently read by me): Normal sinus rhythm with right bundle branch block and repolarization changes.  QTC 42 [name].  ECG (independently read by me): Normal sinus rhythm at 66 bpm.  Right bundle-branch block with repolarization changes.  Left anterior hemiblock.  No significant ST segment changes other than repolarization abnormalities and right bundle branch block leads.  December 2015 ECG (independently read by me): Normal sinus rhythm at 65 bpm.  Right bundle-branch block with repolarization changes.  PR interval 182 [name].  QTc interval 484 [name].   LABS:  BMP Latest Ref Rng 10/20/2015 10/20/2015 09/01/2015  Glucose 65 - 99 mg/dL 114(H) 157(H) -  BUN 6 - 20 mg/dL 22(H) 24(H) -  Creatinine 0.44 - 1.00 mg/dL 1.50(H) 1.80(H) 0.76  Sodium 135 - 145 mmol/L 142 141 -  Potassium 3.5 - 5.1 mmol/L 3.6 3.6 -  Chloride 101 - 111 mmol/L 104 106 -  CO2 22 - 32 mmol/L - 26 -  Calcium 8.9 - 10.3 mg/dL - 9.2 -    Hepatic Function Latest Ref Rng 01/19/2015 11/16/2014 07/11/2014  Total Protein 6.0 - 8.5 g/dL 6.7 6.3 6.3  Albumin 3.5 - 4.8 g/dL 3.8 3.6 3.0(L)  AST 0 - 40 IU/L '12 9  13  ' ALT 0 - 32 IU/L '7 6 11  ' Alk Phosphatase 39 - 117 IU/L 66 75 74  Total Bilirubin 0.0 - 1.2 mg/dL 0.3 0.3 0.2(L)    CBC Latest Ref Rng 10/20/2015 10/20/2015 08/29/2015  WBC 4.0 - 10.5 K/uL - 10.6(H) 7.0  Hemoglobin 12.0 - 15.0 g/dL 12.6 12.3 11.7(L)  Hematocrit 36.0 - 46.0 % 37.0 36.7 36.1  Platelets 150 - 400 K/uL -  213 152   Lab Results  Component Value Date   MCV 84.6 10/20/2015   MCV 84.7 08/29/2015   MCV 85.3 08/28/2015   Lab Results  Component Value Date   TSH 1.437 08/28/2015   Lab Results  Component Value Date   HGBA1C 6.0* 03/02/2014       Component Value Date/Time   PROBNP 67.9 12/01/2011 1035   Lipid Panel     Component Value Date/Time   CHOL 146 01/19/2015 1048   CHOL 171 03/02/2014 1057   TRIG 104 01/19/2015 1048   HDL 48 01/19/2015 1048   HDL 56 03/02/2014 1057   CHOLHDL 3.1 03/02/2014 1057   VLDL 17 03/02/2014 1057   LDLCALC 77 01/19/2015 1048   LDLCALC 98 03/02/2014 1057     RADIOLOGY: No results found.    ASSESSMENT AND PLAN: [name] is a 82 year old female who has established CAD who suffered an acute coronary syndrome in 2007 and underwent PCI to a totally occluded ramus intermediate vessel.  Remotely, she had undergone prior stenting to her RCA and had been demonstrated to have concomitant CAD. Her most recent cardiac catheterization  revealed patent LAD and RCA stents.  The circumflex had stable 50% stenosis in the distal AV groove before the left PDA which was unchanged from 2007.  She also was noted to have an intramyocardial bridge in the mid LAD.  Her blood pressure today is improved on her medical regimen consisting of amlodipine 5 mg, carvedilol 6.25 twice a day, chlorthalidone 12.5 mg, losartan at reduced dose of 50 g daily, but she has continued to take hydralazine 75 mg every 6 hours.  For medication simplification  I recommended she change this to a 100 mg hydralazine pill and take this just 3 times a day.  She had developed stage III  chronic kidney disease.  I have suggested she not take Naprosyn on a daily basis but only when necessary.  She continues to take Crestor 40 mg for hyperlipidemia and denies myalgias.  She does not have any significant edema today continues to take chlorthalidone 1212.5 mg daily.  Her BMI is 39.14.  We discussed weight loss and exercise.  She's not having any anginal symptoms.  Her current regimen.  She denies recent reflux symptoms on Nexium 40 mg daily.  She has no interest in pursuing CPAP therapy for her documented obstructive sleep apnea.  As long she remains stable I'll see her in 6 months for reevaluation. Time spent: 25 minutes  Troy Sine, MD, [facility]  01/27/2016 5:52 PM

## 2016-01-30 ENCOUNTER — Other Ambulatory Visit: Payer: Self-pay | Admitting: *Deleted

## 2016-01-31 ENCOUNTER — Telehealth: Payer: Self-pay | Admitting: Cardiovascular Disease

## 2016-01-31 DIAGNOSIS — Z6833 Body mass index (BMI) 33.0-33.9, adult: Secondary | ICD-10-CM | POA: Diagnosis not present

## 2016-01-31 DIAGNOSIS — Z1389 Encounter for screening for other disorder: Secondary | ICD-10-CM | POA: Diagnosis not present

## 2016-01-31 DIAGNOSIS — I2510 Atherosclerotic heart disease of native coronary artery without angina pectoris: Secondary | ICD-10-CM | POA: Diagnosis not present

## 2016-01-31 DIAGNOSIS — E782 Mixed hyperlipidemia: Secondary | ICD-10-CM | POA: Diagnosis not present

## 2016-01-31 DIAGNOSIS — Z0000 Encounter for general adult medical examination without abnormal findings: Secondary | ICD-10-CM | POA: Diagnosis not present

## 2016-01-31 DIAGNOSIS — E6609 Other obesity due to excess calories: Secondary | ICD-10-CM | POA: Diagnosis not present

## 2016-01-31 MED ORDER — CARVEDILOL 12.5 MG PO TABS: 6.2500 mg | ORAL_TABLET | Freq: Two times a day (BID) | ORAL | Status: DC

## 2016-01-31 MED ORDER — CARVEDILOL 6.25 MG PO TABS: 6.2500 mg | ORAL_TABLET | Freq: Two times a day (BID) | ORAL | Status: DC

## 2016-01-31 NOTE — Telephone Encounter (Signed)
New Message  Pt wanted to speak w/ RN about her Carvedilol. Please call back and discuss.

## 2016-01-31 NOTE — Telephone Encounter (Signed)
Returned call to pt. Pt calling to get refill for her carvedilol. This was done for 90 day supply and sent to her pharmacy.

## 2016-01-31 NOTE — Telephone Encounter (Signed)
Rx(s) sent to pharmacy electronically.  

## 2016-02-07 ENCOUNTER — Other Ambulatory Visit: Payer: Self-pay | Admitting: Cardiovascular Disease

## 2016-02-07 NOTE — Telephone Encounter (Signed)
losartan (COZAAR) 50 MG tablet  Medication   Date: 11/03/2015  [facility]  Ordering/Authorizing: Lennette Biharihomas A Kelly, MD      Order Providers    Prescribing Provider Encounter Provider   Lennette Biharihomas A Kelly, MD Lennette Biharihomas A Kelly, MD    Medication Detail      Disp Refills Start End     losartan (COZAAR) 50 MG tablet 30 tablet 6 11/03/2015     Sig - Route: Take 1 tablet (50 mg total) by mouth daily. - Oral    E-Prescribing Status: Receipt confirmed by pharmacy (11/03/2015 11:18 AM EST)     Pharmacy    [id number] - [place], [place] - [address] [facility]

## 2016-02-22 DIAGNOSIS — Z6833 Body mass index (BMI) 33.0-33.9, adult: Secondary | ICD-10-CM | POA: Diagnosis not present

## 2016-02-22 DIAGNOSIS — Z1389 Encounter for screening for other disorder: Secondary | ICD-10-CM | POA: Diagnosis not present

## 2016-02-22 DIAGNOSIS — R944 Abnormal results of kidney function studies: Secondary | ICD-10-CM | POA: Diagnosis not present

## 2016-03-07 DIAGNOSIS — M25562 Pain in left knee: Secondary | ICD-10-CM | POA: Diagnosis not present

## 2016-03-07 DIAGNOSIS — M25561 Pain in right knee: Secondary | ICD-10-CM | POA: Diagnosis not present

## 2016-03-07 DIAGNOSIS — M170 Bilateral primary osteoarthritis of knee: Secondary | ICD-10-CM | POA: Diagnosis not present

## 2016-03-07 DIAGNOSIS — R262 Difficulty in walking, not elsewhere classified: Secondary | ICD-10-CM | POA: Diagnosis not present

## 2016-03-11 ENCOUNTER — Other Ambulatory Visit: Payer: Self-pay | Admitting: Cardiovascular Disease

## 2016-03-16 DIAGNOSIS — R2689 Other abnormalities of gait and mobility: Secondary | ICD-10-CM | POA: Diagnosis not present

## 2016-03-16 DIAGNOSIS — M25561 Pain in right knee: Secondary | ICD-10-CM | POA: Diagnosis not present

## 2016-03-16 DIAGNOSIS — M25562 Pain in left knee: Secondary | ICD-10-CM | POA: Diagnosis not present

## 2016-03-16 DIAGNOSIS — M170 Bilateral primary osteoarthritis of knee: Secondary | ICD-10-CM | POA: Diagnosis not present

## 2016-03-16 DIAGNOSIS — M1712 Unilateral primary osteoarthritis, left knee: Secondary | ICD-10-CM | POA: Diagnosis not present

## 2016-03-22 DIAGNOSIS — R2689 Other abnormalities of gait and mobility: Secondary | ICD-10-CM | POA: Diagnosis not present

## 2016-03-22 DIAGNOSIS — M1711 Unilateral primary osteoarthritis, right knee: Secondary | ICD-10-CM | POA: Diagnosis not present

## 2016-03-22 DIAGNOSIS — M25562 Pain in left knee: Secondary | ICD-10-CM | POA: Diagnosis not present

## 2016-03-22 DIAGNOSIS — M170 Bilateral primary osteoarthritis of knee: Secondary | ICD-10-CM | POA: Diagnosis not present

## 2016-03-22 DIAGNOSIS — M25561 Pain in right knee: Secondary | ICD-10-CM | POA: Diagnosis not present

## 2016-03-28 DIAGNOSIS — M25561 Pain in right knee: Secondary | ICD-10-CM | POA: Diagnosis not present

## 2016-03-28 DIAGNOSIS — M25562 Pain in left knee: Secondary | ICD-10-CM | POA: Diagnosis not present

## 2016-03-28 DIAGNOSIS — N184 Chronic kidney disease, stage 4 (severe): Secondary | ICD-10-CM | POA: Diagnosis not present

## 2016-03-28 DIAGNOSIS — E785 Hyperlipidemia, unspecified: Secondary | ICD-10-CM | POA: Diagnosis not present

## 2016-03-28 DIAGNOSIS — M170 Bilateral primary osteoarthritis of knee: Secondary | ICD-10-CM | POA: Diagnosis not present

## 2016-03-28 DIAGNOSIS — N179 Acute kidney failure, unspecified: Secondary | ICD-10-CM | POA: Diagnosis not present

## 2016-03-28 DIAGNOSIS — I129 Hypertensive chronic kidney disease with stage 1 through stage 4 chronic kidney disease, or unspecified chronic kidney disease: Secondary | ICD-10-CM | POA: Diagnosis not present

## 2016-03-28 DIAGNOSIS — M1712 Unilateral primary osteoarthritis, left knee: Secondary | ICD-10-CM | POA: Diagnosis not present

## 2016-03-28 DIAGNOSIS — R2689 Other abnormalities of gait and mobility: Secondary | ICD-10-CM | POA: Diagnosis not present

## 2016-04-04 DIAGNOSIS — I129 Hypertensive chronic kidney disease with stage 1 through stage 4 chronic kidney disease, or unspecified chronic kidney disease: Secondary | ICD-10-CM | POA: Diagnosis not present

## 2016-04-04 DIAGNOSIS — N184 Chronic kidney disease, stage 4 (severe): Secondary | ICD-10-CM | POA: Diagnosis not present

## 2016-04-04 DIAGNOSIS — M25562 Pain in left knee: Secondary | ICD-10-CM | POA: Diagnosis not present

## 2016-04-04 DIAGNOSIS — M1711 Unilateral primary osteoarthritis, right knee: Secondary | ICD-10-CM | POA: Diagnosis not present

## 2016-04-04 DIAGNOSIS — M25561 Pain in right knee: Secondary | ICD-10-CM | POA: Diagnosis not present

## 2016-04-04 DIAGNOSIS — N179 Acute kidney failure, unspecified: Secondary | ICD-10-CM | POA: Diagnosis not present

## 2016-04-04 DIAGNOSIS — E785 Hyperlipidemia, unspecified: Secondary | ICD-10-CM | POA: Diagnosis not present

## 2016-04-04 DIAGNOSIS — M170 Bilateral primary osteoarthritis of knee: Secondary | ICD-10-CM | POA: Diagnosis not present

## 2016-04-04 DIAGNOSIS — R2689 Other abnormalities of gait and mobility: Secondary | ICD-10-CM | POA: Diagnosis not present

## 2016-04-10 DIAGNOSIS — M25562 Pain in left knee: Secondary | ICD-10-CM | POA: Diagnosis not present

## 2016-04-10 DIAGNOSIS — M170 Bilateral primary osteoarthritis of knee: Secondary | ICD-10-CM | POA: Diagnosis not present

## 2016-04-10 DIAGNOSIS — R2689 Other abnormalities of gait and mobility: Secondary | ICD-10-CM | POA: Diagnosis not present

## 2016-04-10 DIAGNOSIS — M25561 Pain in right knee: Secondary | ICD-10-CM | POA: Diagnosis not present

## 2016-04-25 DIAGNOSIS — R2689 Other abnormalities of gait and mobility: Secondary | ICD-10-CM | POA: Diagnosis not present

## 2016-04-25 DIAGNOSIS — M25562 Pain in left knee: Secondary | ICD-10-CM | POA: Diagnosis not present

## 2016-04-25 DIAGNOSIS — M170 Bilateral primary osteoarthritis of knee: Secondary | ICD-10-CM | POA: Diagnosis not present

## 2016-04-25 DIAGNOSIS — M25561 Pain in right knee: Secondary | ICD-10-CM | POA: Diagnosis not present

## 2016-05-30 DIAGNOSIS — I129 Hypertensive chronic kidney disease with stage 1 through stage 4 chronic kidney disease, or unspecified chronic kidney disease: Secondary | ICD-10-CM | POA: Diagnosis not present

## 2016-05-30 DIAGNOSIS — N2581 Secondary hyperparathyroidism of renal origin: Secondary | ICD-10-CM | POA: Diagnosis not present

## 2016-05-30 DIAGNOSIS — N184 Chronic kidney disease, stage 4 (severe): Secondary | ICD-10-CM | POA: Diagnosis not present

## 2016-05-30 DIAGNOSIS — E559 Vitamin D deficiency, unspecified: Secondary | ICD-10-CM | POA: Diagnosis not present

## 2016-05-30 DIAGNOSIS — R809 Proteinuria, unspecified: Secondary | ICD-10-CM | POA: Diagnosis not present

## 2016-06-04 DIAGNOSIS — I129 Hypertensive chronic kidney disease with stage 1 through stage 4 chronic kidney disease, or unspecified chronic kidney disease: Secondary | ICD-10-CM | POA: Diagnosis not present

## 2016-06-04 DIAGNOSIS — N183 Chronic kidney disease, stage 3 (moderate): Secondary | ICD-10-CM | POA: Diagnosis not present

## 2016-06-04 DIAGNOSIS — E559 Vitamin D deficiency, unspecified: Secondary | ICD-10-CM | POA: Diagnosis not present

## 2016-06-04 DIAGNOSIS — R809 Proteinuria, unspecified: Secondary | ICD-10-CM | POA: Diagnosis not present

## 2016-06-04 DIAGNOSIS — N2581 Secondary hyperparathyroidism of renal origin: Secondary | ICD-10-CM | POA: Diagnosis not present

## 2016-06-08 ENCOUNTER — Other Ambulatory Visit: Payer: Self-pay

## 2016-06-08 MED ORDER — CLOPIDOGREL BISULFATE 75 MG PO TABS: 75.0000 mg | ORAL_TABLET | Freq: Every day | ORAL | 1 refills | Status: DC

## 2016-06-20 DIAGNOSIS — J069 Acute upper respiratory infection, unspecified: Secondary | ICD-10-CM | POA: Diagnosis not present

## 2016-06-20 DIAGNOSIS — J209 Acute bronchitis, unspecified: Secondary | ICD-10-CM | POA: Diagnosis not present

## 2016-06-20 DIAGNOSIS — Z6832 Body mass index (BMI) 32.0-32.9, adult: Secondary | ICD-10-CM | POA: Diagnosis not present

## 2016-06-20 DIAGNOSIS — E6609 Other obesity due to excess calories: Secondary | ICD-10-CM | POA: Diagnosis not present

## 2016-07-25 DIAGNOSIS — R262 Difficulty in walking, not elsewhere classified: Secondary | ICD-10-CM | POA: Diagnosis not present

## 2016-07-25 DIAGNOSIS — M170 Bilateral primary osteoarthritis of knee: Secondary | ICD-10-CM | POA: Diagnosis not present

## 2016-07-25 DIAGNOSIS — M25562 Pain in left knee: Secondary | ICD-10-CM | POA: Diagnosis not present

## 2016-07-25 DIAGNOSIS — M25561 Pain in right knee: Secondary | ICD-10-CM | POA: Diagnosis not present

## 2016-08-10 ENCOUNTER — Other Ambulatory Visit: Payer: Self-pay | Admitting: Cardiovascular Disease

## 2016-08-29 DIAGNOSIS — N2581 Secondary hyperparathyroidism of renal origin: Secondary | ICD-10-CM | POA: Diagnosis not present

## 2016-08-29 DIAGNOSIS — I129 Hypertensive chronic kidney disease with stage 1 through stage 4 chronic kidney disease, or unspecified chronic kidney disease: Secondary | ICD-10-CM | POA: Diagnosis not present

## 2016-08-29 DIAGNOSIS — N183 Chronic kidney disease, stage 3 (moderate): Secondary | ICD-10-CM | POA: Diagnosis not present

## 2016-08-29 DIAGNOSIS — E559 Vitamin D deficiency, unspecified: Secondary | ICD-10-CM | POA: Diagnosis not present

## 2016-08-30 DIAGNOSIS — E559 Vitamin D deficiency, unspecified: Secondary | ICD-10-CM | POA: Diagnosis not present

## 2016-08-30 DIAGNOSIS — N2581 Secondary hyperparathyroidism of renal origin: Secondary | ICD-10-CM | POA: Diagnosis not present

## 2016-08-30 DIAGNOSIS — I129 Hypertensive chronic kidney disease with stage 1 through stage 4 chronic kidney disease, or unspecified chronic kidney disease: Secondary | ICD-10-CM | POA: Diagnosis not present

## 2016-08-30 DIAGNOSIS — N183 Chronic kidney disease, stage 3 (moderate): Secondary | ICD-10-CM | POA: Diagnosis not present

## 2016-10-07 ENCOUNTER — Other Ambulatory Visit: Payer: Self-pay | Admitting: Cardiovascular Disease

## 2016-10-18 DIAGNOSIS — R070 Pain in throat: Secondary | ICD-10-CM | POA: Diagnosis not present

## 2016-10-18 DIAGNOSIS — J343 Hypertrophy of nasal turbinates: Secondary | ICD-10-CM | POA: Diagnosis not present

## 2016-10-18 DIAGNOSIS — Z1389 Encounter for screening for other disorder: Secondary | ICD-10-CM | POA: Diagnosis not present

## 2016-10-18 DIAGNOSIS — J181 Lobar pneumonia, unspecified organism: Secondary | ICD-10-CM | POA: Diagnosis not present

## 2016-10-18 DIAGNOSIS — Z6831 Body mass index (BMI) 31.0-31.9, adult: Secondary | ICD-10-CM | POA: Diagnosis not present

## 2016-10-18 DIAGNOSIS — E669 Obesity, unspecified: Secondary | ICD-10-CM | POA: Diagnosis not present

## 2016-10-18 DIAGNOSIS — E6609 Other obesity due to excess calories: Secondary | ICD-10-CM | POA: Diagnosis not present

## 2016-10-18 DIAGNOSIS — J069 Acute upper respiratory infection, unspecified: Secondary | ICD-10-CM | POA: Diagnosis not present

## 2016-10-18 DIAGNOSIS — I10 Essential (primary) hypertension: Secondary | ICD-10-CM | POA: Diagnosis not present

## 2016-10-18 DIAGNOSIS — J209 Acute bronchitis, unspecified: Secondary | ICD-10-CM | POA: Diagnosis not present

## 2016-11-16 ENCOUNTER — Encounter (HOSPITAL_COMMUNITY)
Admission: EM | Disposition: A | Discharge status: Home / Self Care | Payer: Self-pay | Source: Home / Self Care | Attending: Cardiovascular Disease

## 2016-11-16 ENCOUNTER — Emergency Department (HOSPITAL_COMMUNITY): Payer: Medicare Other

## 2016-11-16 ENCOUNTER — Encounter (HOSPITAL_COMMUNITY): Payer: Self-pay | Admitting: Cardiovascular Disease

## 2016-11-16 ENCOUNTER — Inpatient Hospital Stay (HOSPITAL_COMMUNITY)
Admission: EM | Admit: 2016-11-16 | Discharge: 2016-11-20 | DRG: 247 | Disposition: A | Discharge status: Home / Self Care | Payer: Medicare Other | Attending: Cardiovascular Disease | Admitting: Cardiovascular Disease

## 2016-11-16 DIAGNOSIS — E669 Obesity, unspecified: Secondary | ICD-10-CM | POA: Diagnosis present

## 2016-11-16 DIAGNOSIS — M1990 Unspecified osteoarthritis, unspecified site: Secondary | ICD-10-CM | POA: Diagnosis not present

## 2016-11-16 DIAGNOSIS — I959 Hypotension, unspecified: Secondary | ICD-10-CM | POA: Diagnosis present

## 2016-11-16 DIAGNOSIS — I214 Non-ST elevation (NSTEMI) myocardial infarction: Principal | ICD-10-CM | POA: Diagnosis present

## 2016-11-16 DIAGNOSIS — G4733 Obstructive sleep apnea (adult) (pediatric): Secondary | ICD-10-CM | POA: Diagnosis present

## 2016-11-16 DIAGNOSIS — I442 Atrioventricular block, complete: Secondary | ICD-10-CM | POA: Diagnosis not present

## 2016-11-16 DIAGNOSIS — Z888 Allergy status to other drugs, medicaments and biological substances status: Secondary | ICD-10-CM | POA: Diagnosis not present

## 2016-11-16 DIAGNOSIS — E782 Mixed hyperlipidemia: Secondary | ICD-10-CM | POA: Diagnosis not present

## 2016-11-16 DIAGNOSIS — E7800 Pure hypercholesterolemia, unspecified: Secondary | ICD-10-CM | POA: Diagnosis present

## 2016-11-16 DIAGNOSIS — I2584 Coronary atherosclerosis due to calcified coronary lesion: Secondary | ICD-10-CM | POA: Diagnosis present

## 2016-11-16 DIAGNOSIS — I10 Essential (primary) hypertension: Secondary | ICD-10-CM | POA: Diagnosis not present

## 2016-11-16 DIAGNOSIS — Z885 Allergy status to narcotic agent status: Secondary | ICD-10-CM

## 2016-11-16 DIAGNOSIS — Z7982 Long term (current) use of aspirin: Secondary | ICD-10-CM

## 2016-11-16 DIAGNOSIS — N183 Chronic kidney disease, stage 3 (moderate): Secondary | ICD-10-CM | POA: Diagnosis present

## 2016-11-16 DIAGNOSIS — I2510 Atherosclerotic heart disease of native coronary artery without angina pectoris: Secondary | ICD-10-CM | POA: Diagnosis not present

## 2016-11-16 DIAGNOSIS — E785 Hyperlipidemia, unspecified: Secondary | ICD-10-CM | POA: Diagnosis present

## 2016-11-16 DIAGNOSIS — Z79899 Other long term (current) drug therapy: Secondary | ICD-10-CM | POA: Diagnosis not present

## 2016-11-16 DIAGNOSIS — K210 Gastro-esophageal reflux disease with esophagitis: Secondary | ICD-10-CM | POA: Diagnosis present

## 2016-11-16 DIAGNOSIS — R531 Weakness: Secondary | ICD-10-CM | POA: Diagnosis not present

## 2016-11-16 DIAGNOSIS — I129 Hypertensive chronic kidney disease with stage 1 through stage 4 chronic kidney disease, or unspecified chronic kidney disease: Secondary | ICD-10-CM | POA: Diagnosis not present

## 2016-11-16 DIAGNOSIS — M549 Dorsalgia, unspecified: Secondary | ICD-10-CM | POA: Diagnosis present

## 2016-11-16 DIAGNOSIS — Z6830 Body mass index (BMI) 30.0-30.9, adult: Secondary | ICD-10-CM | POA: Diagnosis not present

## 2016-11-16 DIAGNOSIS — Z8249 Family history of ischemic heart disease and other diseases of the circulatory system: Secondary | ICD-10-CM | POA: Diagnosis not present

## 2016-11-16 DIAGNOSIS — Z7902 Long term (current) use of antithrombotics/antiplatelets: Secondary | ICD-10-CM | POA: Diagnosis not present

## 2016-11-16 DIAGNOSIS — Z91041 Radiographic dye allergy status: Secondary | ICD-10-CM

## 2016-11-16 DIAGNOSIS — Z9861 Coronary angioplasty status: Secondary | ICD-10-CM

## 2016-11-16 DIAGNOSIS — R2681 Unsteadiness on feet: Secondary | ICD-10-CM | POA: Diagnosis present

## 2016-11-16 DIAGNOSIS — R7989 Other specified abnormal findings of blood chemistry: Secondary | ICD-10-CM | POA: Diagnosis not present

## 2016-11-16 DIAGNOSIS — Z955 Presence of coronary angioplasty implant and graft: Secondary | ICD-10-CM | POA: Diagnosis not present

## 2016-11-16 DIAGNOSIS — R778 Other specified abnormalities of plasma proteins: Secondary | ICD-10-CM

## 2016-11-16 DIAGNOSIS — R079 Chest pain, unspecified: Secondary | ICD-10-CM | POA: Diagnosis not present

## 2016-11-16 HISTORY — PX: CORONARY STENT INTERVENTION: CATH118234

## 2016-11-16 HISTORY — PX: TEMPORARY PACEMAKER: CATH118268

## 2016-11-16 HISTORY — DX: Non-ST elevation (NSTEMI) myocardial infarction: I21.4

## 2016-11-16 HISTORY — DX: Atrioventricular block, complete: I44.2

## 2016-11-16 HISTORY — PX: LEFT HEART CATH AND CORONARY ANGIOGRAPHY: CATH118249

## 2016-11-16 LAB — POCT ACTIVATED CLOTTING TIME: Activated Clotting Time: 389 seconds

## 2016-11-16 LAB — BASIC METABOLIC PANEL
Anion gap: 8 (ref 5–15)
BUN: 13 mg/dL (ref 6–20)
CO2: 23 mmol/L (ref 22–32)
Calcium: 8.7 mg/dL — ABNORMAL LOW (ref 8.9–10.3)
Chloride: 104 mmol/L (ref 101–111)
Creatinine, Ser: 1.38 mg/dL — ABNORMAL HIGH (ref 0.44–1.00)
GFR calc Af Amer: 41 mL/min — ABNORMAL LOW (ref >60)
GFR calc non Af Amer: 35 mL/min — ABNORMAL LOW (ref >60)
Glucose, Bld: 141 mg/dL — ABNORMAL HIGH (ref 65–99)
Potassium: 3.7 mmol/L (ref 3.5–5.1)
Sodium: 135 mmol/L (ref 135–145)

## 2016-11-16 LAB — CBC
HCT: 38.6 % (ref 36.0–46.0)
HCT: 38.8 % (ref 36.0–46.0)
Hemoglobin: 12.9 g/dL (ref 12.0–15.0)
Hemoglobin: 12.9 g/dL (ref 12.0–15.0)
MCH: 28.8 pg (ref 26.0–34.0)
MCH: 28.6 pg (ref 26.0–34.0)
MCHC: 33.4 g/dL (ref 30.0–36.0)
MCHC: 33.2 g/dL (ref 30.0–36.0)
MCV: 86.2 fL (ref 78.0–100.0)
MCV: 86 fL (ref 78.0–100.0)
Platelets: 175 10*3/uL (ref 150–400)
Platelets: 150 10*3/uL (ref 150–400)
RBC: 4.48 MIL/uL (ref 3.87–5.11)
RBC: 4.51 MIL/uL (ref 3.87–5.11)
RDW: 14.7 % (ref 11.5–15.5)
RDW: 14.4 % (ref 11.5–15.5)
WBC: 9.5 10*3/uL (ref 4.0–10.5)
WBC: 10.5 10*3/uL (ref 4.0–10.5)

## 2016-11-16 LAB — CREATININE, SERUM
Creatinine, Ser: 1.25 mg/dL — ABNORMAL HIGH (ref 0.44–1.00)
GFR calc Af Amer: 46 mL/min — ABNORMAL LOW (ref >60)
GFR calc non Af Amer: 40 mL/min — ABNORMAL LOW (ref >60)

## 2016-11-16 LAB — I-STAT TROPONIN, ED: Troponin i, poc: 7.35 ng/mL (ref 0.00–0.08)

## 2016-11-16 LAB — MRSA PCR SCREENING: MRSA by PCR: NEGATIVE

## 2016-11-16 LAB — TROPONIN I
Troponin I: 7.56 ng/mL (ref <0.03)
Troponin I: 7.86 ng/mL (ref <0.03)

## 2016-11-16 SURGERY — TEMPORARY PACEMAKER

## 2016-11-16 MED ORDER — BIVALIRUDIN 250 MG IV SOLR
INTRAVENOUS | Status: AC
  Filled 2016-11-16: qty 250

## 2016-11-16 MED ORDER — SODIUM CHLORIDE 0.9% FLUSH
3.0000 mL | Freq: Two times a day (BID) | INTRAVENOUS | Status: DC
  Administered 2016-11-16 – 2016-11-20 (×7): 3 mL via INTRAVENOUS

## 2016-11-16 MED ORDER — DIPHENHYDRAMINE HCL 50 MG/ML IJ SOLN
INTRAMUSCULAR | Status: DC | PRN
  Administered 2016-11-16: 25 mg via INTRAVENOUS

## 2016-11-16 MED ORDER — ROSUVASTATIN CALCIUM 10 MG PO TABS
40.0000 mg | ORAL_TABLET | Freq: Every day | ORAL | Status: DC
  Administered 2016-11-16 – 2016-11-19 (×4): 40 mg via ORAL
  Filled 2016-11-16: qty 2
  Filled 2016-11-16: qty 4
  Filled 2016-11-16 (×2): qty 2

## 2016-11-16 MED ORDER — METHYLPREDNISOLONE SODIUM SUCC 125 MG IJ SOLR
INTRAMUSCULAR | Status: AC
  Filled 2016-11-16: qty 2

## 2016-11-16 MED ORDER — HEPARIN (PORCINE) IN NACL 2-0.9 UNIT/ML-% IJ SOLN
INTRAMUSCULAR | Status: AC
  Filled 2016-11-16: qty 1000

## 2016-11-16 MED ORDER — FENTANYL CITRATE (PF) 100 MCG/2ML IJ SOLN
INTRAMUSCULAR | Status: AC
  Filled 2016-11-16: qty 2

## 2016-11-16 MED ORDER — ASPIRIN EC 81 MG PO TBEC
81.0000 mg | DELAYED_RELEASE_TABLET | Freq: Every day | ORAL | Status: DC
  Administered 2016-11-17 – 2016-11-20 (×4): 81 mg via ORAL
  Filled 2016-11-16 (×4): qty 1

## 2016-11-16 MED ORDER — HEPARIN (PORCINE) IN NACL 2-0.9 UNIT/ML-% IJ SOLN
INTRAMUSCULAR | Status: DC | PRN
  Administered 2016-11-16: 1000 mL

## 2016-11-16 MED ORDER — TICAGRELOR 90 MG PO TABS
90.0000 mg | ORAL_TABLET | Freq: Two times a day (BID) | ORAL | Status: DC
  Administered 2016-11-16 – 2016-11-20 (×8): 90 mg via ORAL
  Filled 2016-11-16 (×8): qty 1

## 2016-11-16 MED ORDER — LOSARTAN POTASSIUM 50 MG PO TABS
50.0000 mg | ORAL_TABLET | Freq: Every day | ORAL | Status: DC
  Administered 2016-11-17 – 2016-11-20 (×4): 50 mg via ORAL
  Filled 2016-11-16 (×4): qty 1

## 2016-11-16 MED ORDER — LIDOCAINE HCL (PF) 1 % IJ SOLN
INTRAMUSCULAR | Status: AC
  Filled 2016-11-16: qty 30

## 2016-11-16 MED ORDER — HYDRALAZINE HCL 20 MG/ML IJ SOLN: 5.0000 mg | INTRAMUSCULAR | Status: AC | PRN

## 2016-11-16 MED ORDER — LABETALOL HCL 5 MG/ML IV SOLN: 10.0000 mg | INTRAVENOUS | Status: AC | PRN

## 2016-11-16 MED ORDER — SODIUM CHLORIDE 0.9 % IV SOLN
INTRAVENOUS | Status: DC | PRN
  Administered 2016-11-16: 10 mL/h via INTRAVENOUS

## 2016-11-16 MED ORDER — MIDAZOLAM HCL 2 MG/2ML IJ SOLN
INTRAMUSCULAR | Status: DC | PRN
  Administered 2016-11-16: 1 mg via INTRAVENOUS

## 2016-11-16 MED ORDER — SODIUM CHLORIDE 0.9% FLUSH: 3.0000 mL | INTRAVENOUS | Status: DC | PRN

## 2016-11-16 MED ORDER — SODIUM CHLORIDE 0.9 % IV BOLUS (SEPSIS)
500.0000 mL | Freq: Once | INTRAVENOUS | Status: AC
  Administered 2016-11-16: 500 mL via INTRAVENOUS

## 2016-11-16 MED ORDER — DIPHENHYDRAMINE HCL 50 MG/ML IJ SOLN
INTRAMUSCULAR | Status: AC
  Filled 2016-11-16: qty 1

## 2016-11-16 MED ORDER — ONDANSETRON HCL 4 MG/2ML IJ SOLN
4.0000 mg | Freq: Four times a day (QID) | INTRAMUSCULAR | Status: DC | PRN
  Administered 2016-11-16: 4 mg via INTRAVENOUS
  Filled 2016-11-16: qty 2

## 2016-11-16 MED ORDER — ASPIRIN 81 MG PO CHEW: 324.0000 mg | CHEWABLE_TABLET | Freq: Once | ORAL | Status: DC

## 2016-11-16 MED ORDER — HYDRALAZINE HCL 50 MG PO TABS
100.0000 mg | ORAL_TABLET | Freq: Three times a day (TID) | ORAL | Status: DC
  Administered 2016-11-16 – 2016-11-18 (×5): 100 mg via ORAL
  Filled 2016-11-16 (×7): qty 2

## 2016-11-16 MED ORDER — WHITE PETROLATUM GEL
Status: AC
  Administered 2016-11-16: 17:00:00
  Filled 2016-11-16: qty 1

## 2016-11-16 MED ORDER — ACETAMINOPHEN 325 MG PO TABS: 650.0000 mg | ORAL_TABLET | ORAL | Status: DC | PRN

## 2016-11-16 MED ORDER — HEPARIN SODIUM (PORCINE) 5000 UNIT/ML IJ SOLN
5000.0000 [IU] | Freq: Three times a day (TID) | INTRAMUSCULAR | Status: DC
  Administered 2016-11-16 – 2016-11-20 (×9): 5000 [IU] via SUBCUTANEOUS
  Filled 2016-11-16 (×10): qty 1

## 2016-11-16 MED ORDER — SODIUM CHLORIDE 0.9 % IV SOLN: 250.0000 mL | INTRAVENOUS | Status: DC | PRN

## 2016-11-16 MED ORDER — TICAGRELOR 90 MG PO TABS
ORAL_TABLET | ORAL | Status: DC | PRN
  Administered 2016-11-16: 180 mg via ORAL

## 2016-11-16 MED ORDER — BIVALIRUDIN 250 MG IV SOLR
INTRAVENOUS | Status: DC | PRN
  Administered 2016-11-16 (×2): 1.75 mg/kg/h via INTRAVENOUS

## 2016-11-16 MED ORDER — SODIUM CHLORIDE 0.9 % WEIGHT BASED INFUSION
1.0000 mL/kg/h | INTRAVENOUS | Status: AC
  Administered 2016-11-16: 1 mL/kg/h via INTRAVENOUS

## 2016-11-16 MED ORDER — NITROGLYCERIN 0.4 MG SL SUBL
0.4000 mg | SUBLINGUAL_TABLET | SUBLINGUAL | Status: DC | PRN
Start: 2016-11-16 — End: 2016-11-20

## 2016-11-16 MED ORDER — AMLODIPINE BESYLATE 5 MG PO TABS
5.0000 mg | ORAL_TABLET | Freq: Every day | ORAL | Status: DC
  Administered 2016-11-17 – 2016-11-20 (×4): 5 mg via ORAL
  Filled 2016-11-16 (×4): qty 1

## 2016-11-16 MED ORDER — TICAGRELOR 90 MG PO TABS
ORAL_TABLET | ORAL | Status: AC
  Filled 2016-11-16: qty 2

## 2016-11-16 MED ORDER — NITROGLYCERIN 1 MG/10 ML FOR IR/CATH LAB
INTRA_ARTERIAL | Status: DC | PRN
  Administered 2016-11-16: 200 ug via INTRACORONARY
  Administered 2016-11-16 (×2): 150 ug via INTRACORONARY

## 2016-11-16 MED ORDER — FENTANYL CITRATE (PF) 100 MCG/2ML IJ SOLN
INTRAMUSCULAR | Status: DC | PRN
  Administered 2016-11-16: 25 ug via INTRAVENOUS

## 2016-11-16 MED ORDER — IOPAMIDOL (ISOVUE-370) INJECTION 76%
INTRAVENOUS | Status: AC
  Filled 2016-11-16: qty 125

## 2016-11-16 MED ORDER — PANTOPRAZOLE SODIUM 40 MG PO TBEC
40.0000 mg | DELAYED_RELEASE_TABLET | Freq: Every day | ORAL | Status: DC
  Administered 2016-11-17 – 2016-11-20 (×4): 40 mg via ORAL
  Filled 2016-11-16 (×4): qty 1

## 2016-11-16 MED ORDER — MIDAZOLAM HCL 2 MG/2ML IJ SOLN
INTRAMUSCULAR | Status: AC
  Filled 2016-11-16: qty 2

## 2016-11-16 MED ORDER — IOPAMIDOL (ISOVUE-370) INJECTION 76%
INTRAVENOUS | Status: AC
  Filled 2016-11-16: qty 50

## 2016-11-16 MED ORDER — NITROGLYCERIN 1 MG/10 ML FOR IR/CATH LAB
INTRA_ARTERIAL | Status: AC
  Filled 2016-11-16: qty 10

## 2016-11-16 MED ORDER — METHYLPREDNISOLONE SODIUM SUCC 125 MG IJ SOLR
INTRAMUSCULAR | Status: DC | PRN
Start: 2016-11-16 — End: 2016-11-16
  Administered 2016-11-16: 125 mg via INTRAVENOUS

## 2016-11-16 MED ORDER — BIVALIRUDIN BOLUS VIA INFUSION - CUPID
INTRAVENOUS | Status: DC | PRN
  Administered 2016-11-16: 65.325 mg via INTRAVENOUS

## 2016-11-16 MED ORDER — LIDOCAINE HCL (PF) 1 % IJ SOLN
INTRAMUSCULAR | Status: DC | PRN
  Administered 2016-11-16: 25 mL

## 2016-11-16 MED ORDER — IOPAMIDOL (ISOVUE-370) INJECTION 76%
INTRAVENOUS | Status: DC | PRN
  Administered 2016-11-16: 115 mL via INTRA_ARTERIAL

## 2016-11-16 SURGICAL SUPPLY — 20 items
BALLN MOZEC 2.0X12 (BALLOONS) ×2
BALLN ~~LOC~~ MOZEC 2.75X10 (BALLOONS) ×2
BALLOON MOZEC 2.0X12 (BALLOONS) IMPLANT
BALLOON ~~LOC~~ MOZEC 2.75X10 (BALLOONS) IMPLANT
CABLE ADAPT CONN TEMP 6FT (ADAPTER) ×1 IMPLANT
CATH INFINITI JR4 5F (CATHETERS) ×1 IMPLANT
CATH LAUNCHER 6FR AL1 (CATHETERS) IMPLANT
CATH S G BIP PACING (SET/KITS/TRAYS/PACK) ×1 IMPLANT
CATHETER LAUNCHER 6FR AL1 (CATHETERS) ×2
DEVICE CLOSURE PERCLS PRGLD 6F (VASCULAR PRODUCTS) IMPLANT
KIT HEART LEFT (KITS) ×2 IMPLANT
PACK CARDIAC CATHETERIZATION (CUSTOM PROCEDURE TRAY) ×2 IMPLANT
PERCLOSE PROGLIDE 6F (VASCULAR PRODUCTS) ×2
SHEATH PINNACLE 6F 10CM (SHEATH) ×2 IMPLANT
SLEEVE REPOSITIONING LENGTH 30 (MISCELLANEOUS) ×1 IMPLANT
STENT ELUNIR 2.5 X 12 (Permanent Stent) ×1 IMPLANT
TRANSDUCER W/STOPCOCK (MISCELLANEOUS) ×2 IMPLANT
TUBING CIL FLEX 10 FLL-RA (TUBING) ×2 IMPLANT
WIRE COUGAR XT STRL 190CM (WIRE) ×1 IMPLANT
WIRE EMERALD 3MM-J .035X150CM (WIRE) ×1 IMPLANT

## 2016-11-16 NOTE — ED Notes (Signed)
Zoll pads placed as precaution. Dr. Arizona ConstableStienl at bedside. Pt c/o abdominal pain but denies back or chest pain.

## 2016-11-16 NOTE — Progress Notes (Signed)
CRITICAL VALUE ALERT  Critical value received:  Troponin 7.56  Date of notification:  11/17/14  Time of notification:  1420  Critical value read back:Yes.    Nurse who received alert:  Willeen Nieceebecca Mathilde Mcwherter  MD notified (1st page): PA Janee Mornhompson  Time of first page:  1420, expected value. Continue to monitor.

## 2016-11-16 NOTE — ED Provider Notes (Signed)
[facility]-EMERGENCY DEPT Provider Note   CSN: [id number] Arrival date & time: 11/16/16  1035     History   Chief Complaint Chief Complaint  Patient presents with  . Hypotension    HPI [name] is a 83 y.o. female.  Patient indicates feeling 'bad' for the past 2-3 days. States was having pain in her chest, back and abdomen, dull, mild-moderate, persistent.  States she didn't want to bother anyone, so just stayed at home. She indicates as was feeling so bad, did not take any of her medication.  Also states ate/drank less than normal, and did have a few episodes of diarrhea. +nausea. No vomiting. In past 24 hours noted increased generalized weakness, lightheaded when stands, so came to get checked. Hx cad.  Denies current chest pain or discomfort.    The history is provided by the patient and a relative.    Past Medical History:  Diagnosis Date  . Arthritis   . Coronary artery disease 11/2009   2D Echo EF>55  . High cholesterol   . Hypertension   . Obesity   . Reflux esophagitis   . Sleep apnea 09/27/2010   Untreated, REM 64.7/hr AHI 18.5/hr RDI 19.2/hr. Patuent refused CPAP therapy    Patient Active Problem List   Diagnosis Date Noted  . Renal insufficiency 11/03/2015  . 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 Uncontrolled hypertension 08/31/2015  . HTN (hypertension), malignant   . Symptomatic bradycardia   . Hyperlipidemia   . Epigastric fullness   . Near syncope 08/28/2015  . CAD S/P PCI   . Essential hypertension 03/05/2013  . Obesity (BMI 30-39.9) 03/05/2013  . Hyperlipidemia with target LDL less than 70 03/05/2013  . Obstructive sleep apnea 03/05/2013    Past Surgical History:  Procedure Laterality Date  . CHOLECYSTECTOMY    . CORONARY ANGIOPLASTY WITH STENT PLACEMENT  2007   A 3.0x28mm CYPHER stent post dilated to 3.29 mm the 100% occlusion was reduced to 0%  . LEFT HEART CATHETERIZATION WITH CORONARY ANGIOGRAM  N/A 07/12/2014   Procedure: LEFT HEART CATHETERIZATION WITH CORONARY ANGIOGRAM;  Surgeon: Micheline Chapman, MD;  Location: [facility] CATH LAB;  Service: Cardiovascular;  Laterality: N/A;    OB History    No data available       Home Medications    Prior to Admission medications   Medication Sig Start Date End Date Taking? Authorizing Provider  amLODipine (NORVASC) 5 MG tablet Take 5 mg by mouth daily. Take 1 tab daily 09/15/15   Historical Provider, MD  aspirin 81 MG tablet Take 81 mg by mouth daily.    Historical Provider, MD  carvedilol (COREG) 6.25 MG tablet Take 1 tablet (6.25 mg total) by mouth 2 (two) times daily. 01/31/16   Lennette Bihari, MD  chlorthalidone (HYGROTON) 25 MG tablet Take 0.5 tablets (12.5 mg total) by mouth daily. 09/02/15   Auburn Bilberry, MD  clopidogrel (PLAVIX) 75 MG tablet Take 1 tablet (75 mg total) by mouth daily. PLEASE CONTACT OFFICE FOR ADDITIONAL REFILLS 06/08/16   Lennette Bihari, MD  esomeprazole (NEXIUM) 40 MG capsule TAKE 1 CAPSULE (40 MG TOTAL) BY MOUTH DAILY BEFORE BREAKFAST. 01/06/16   Lennette Bihari, MD  fluticasone [facility]) 50 MCG/ACT nasal spray Place 1 spray into the nose as needed for allergies or rhinitis.  12/03/12   Historical Provider, MD  furosemide (LASIX) 40 MG tablet TAKE 1 TABLET BY MOUTH DAILY AS NEEDED FOR EDEMA 10/10/15  Lennette Bihari, MD  hydrALAZINE (APRESOLINE) 100 MG tablet Take 1 tablet (100 mg total) by mouth 3 (three) times daily. 01/27/16   Lennette Bihari, MD  Iron Succinyl-Protein Complex 40 MG/15ML SOLN Take 40 mg by mouth daily.    Historical Provider, MD  losartan (COZAAR) 50 MG tablet Take 1 tablet (50 mg total) by mouth daily. 08/10/16   Lennette Bihari, MD  naproxen sodium (ANAPROX) 220 MG tablet Take 440 mg by mouth 2 (two) times daily with a meal.    Historical Provider, MD  nitroGLYCERIN (NITROSTAT) 0.4 MG SL tablet PLACE 1 TABLET (0.4 MG TOTAL) UNDER THE TONGUE EVERY 5 (FIVE) MINUTES AS NEEDED. 10/08/16   Lennette Bihari, MD    rosuvastatin (CRESTOR) 40 MG tablet Take 1 tablet (40 mg total) by mouth daily. Patient taking differently: Take 40 mg by mouth every other day.  11/26/14   Lennette Bihari, MD    Family History Family History  Problem Relation Age of Onset  . Hypertension Other     Social History Social History  Substance Use Topics  . Smoking status: Never Smoker  . Smokeless tobacco: Never Used  . Alcohol use No     Allergies   Contrast media [iodinated diagnostic agents] and Codeine   Review of Systems Review of Systems  Constitutional: Negative for chills and fever.  HENT: Negative for sore throat.   Eyes: Negative for redness.  Respiratory: Negative for shortness of breath.   Cardiovascular: Positive for chest pain.  Gastrointestinal: Positive for abdominal pain.  Genitourinary: Negative for dysuria and flank pain.  Musculoskeletal: Positive for back pain. Negative for neck pain.  Skin: Negative for rash.  Neurological: Positive for weakness and light-headedness. Negative for headaches.  Hematological: Does not bruise/bleed easily.  Psychiatric/Behavioral: Negative for confusion.     Physical Exam Updated Vital Signs BP (!) 85/66 (BP Location: Right Arm)   Pulse (!) 50   Resp 20   Physical Exam  Constitutional: She is oriented to person, place, and time. She appears well-developed and well-nourished. She appears distressed.  Generally weak appearing, bradycardic, soft bp.   HENT:  Mouth/Throat: Oropharynx is clear and moist.  Eyes: Conjunctivae are normal. No scleral icterus.  Neck: Neck supple. No tracheal deviation present.  Cardiovascular: Normal rate.   Bradycardic.   Pulmonary/Chest: Effort normal and breath sounds normal. No respiratory distress.  Abdominal: Soft. Normal appearance and bowel sounds are normal. She exhibits no distension. There is no tenderness.  No pulsatile mass.   Genitourinary:  Genitourinary Comments: No cva tenderness  Musculoskeletal: She  exhibits no edema.  Neurological: She is alert and oriented to person, place, and time.  Skin: Skin is warm. No rash noted. She is not diaphoretic.  Psychiatric: She has a normal mood and affect.  Nursing note and vitals reviewed.    ED Treatments / Results  Labs (all labs ordered are listed, but only abnormal results are displayed) Results for orders placed or performed during the hospital encounter of 11/16/16  CBC  Result Value Ref Range   WBC 9.5 4.0 - 10.5 K/uL   RBC 4.48 3.87 - 5.11 MIL/uL   Hemoglobin 12.9 12.0 - 15.0 g/dL   HCT 31.5 17.6 - 16.0 %   MCV 86.2 78.0 - 100.0 fL   MCH 28.8 26.0 - 34.0 pg   MCHC 33.4 30.0 - 36.0 g/dL   RDW 73.7 10.6 - 26.9 %   Platelets 175 150 - 400 K/uL  Basic  metabolic panel  Result Value Ref Range   Sodium 135 135 - 145 mmol/L   Potassium 3.7 3.5 - 5.1 mmol/L   Chloride 104 101 - 111 mmol/L   CO2 23 22 - 32 mmol/L   Glucose, Bld 141 (H) 65 - 99 mg/dL   BUN 13 6 - 20 mg/dL   Creatinine, Ser 1.61 (H) 0.44 - 1.00 mg/dL   Calcium 8.7 (L) 8.9 - 10.3 mg/dL   GFR calc non Af Amer 35 (L) >60 mL/min   GFR calc Af Amer 41 (L) >60 mL/min   Anion gap 8 5 - 15  I-stat troponin, ED  Result Value Ref Range   Troponin i, poc 7.35 (HH) 0.00 - 0.08 ng/mL   Comment NOTIFIED PHYSICIAN    Comment 3            EKG  EKG Interpretation  Date/Time:  Friday November 16 2016 10:45:20 EST Ventricular Rate:  50 PR Interval:    QRS Duration: 112 QT Interval:  482 QTC Calculation: 439 R Axis:   -86 Text Interpretation:  Sinus rhythm with complete heart block and Junctional rhythm Right bundle branch block Left anterior fascicular blockBifascicular blockConfirmed by Denton Lank  MD, Caryn Bee ([id number]) on 11/16/2016 10:56:36 AM Also confirmed by Denton Lank  MD, Caryn Bee ([id number]), editor [place], Jola Babinski [phone])  on 11/16/2016 11:32:03 AM       Radiology Dg Chest Port 1 View  Result Date: 11/16/2016 CLINICAL DATA:  Weakness, chest pain, preop cath EXAM: PORTABLE CHEST 1 VIEW  COMPARISON:  05/02/2015 FINDINGS: Cardiomegaly. No frank interstitial edema. No pleural effusion or pneumothorax. Defibrillator pad overlying the left hemithorax. IMPRESSION: No evidence of acute cardiopulmonary disease. Electronically Signed   By: Charline Bills M.D.   On: 11/16/2016 12:02    Procedures Procedures (including critical care time)  Medications Ordered in ED Medications  aspirin chewable tablet 324 mg (not administered)  sodium chloride 0.9 % bolus 500 mL (500 mLs Intravenous New Bag/Given 11/16/16 1108)     Initial Impression / Assessment and Plan / ED Course  I have reviewed the triage vital signs and the nursing notes.  Pertinent labs & imaging results that were available during my care of the patient were reviewed by me and considered in my medical decision making (see chart for details).  Iv ns. Continuous pulse ox and monitor. o2 [place]. Ecg.  Cardiology paged upon viewing ecg - complete heart block, and ?slight st elv III, and st dep v2.   External pacer pads applied.   Chewable asa.  No chest pain or discomfort currently.   Cardiology coming to see pt stat.  Trop results high 7.    Suspect MI as of 2 days ago when symptoms started. No cp currently.    Cardiology to take patient emergently to cath lab.   CRITICAL CARE  RE NSTEMI, elevated troponin, complete heart block.  Performed by: Suzi Roots Total critical care time: 35 minutes Critical care time was exclusive of separately billable procedures and treating other patients. Critical care was necessary to treat or prevent imminent or life-threatening deterioration. Critical care was time spent personally by me on the following activities: development of treatment plan with patient and/or surrogate as well as nursing, discussions with consultants, evaluation of patient's response to treatment, examination of patient, obtaining history from patient or surrogate, ordering and performing treatments and  interventions, ordering and review of laboratory studies, ordering and review of radiographic studies, pulse oximetry and re-evaluation of patient's condition.  Final Clinical Impressions(s) / ED Diagnoses   Final diagnoses:  None    New Prescriptions New Prescriptions   No medications on file     Cathren LaineKevin Shemiah Rosch, MD 11/16/16 1629

## 2016-11-16 NOTE — ED Notes (Signed)
Pt to room 13 . Complete heart block noted on monitor. Dr. Arizona ConstableStienl notified.

## 2016-11-16 NOTE — H&P (Signed)
History and Physical  Patient ID: [name] MRN: [id number], SOB: May 04, 1934 83 y.o. Date of Encounter: 11/16/2016, 11:50 AM  Primary Physician: Colette Ribas, MD Primary Cardiologist: Tresa Endo  Chief Complaint: Chest pain/back pain/nausea/vomiting  HPI: 83 y.o. female w/ PMHx significant for CAD, prior PCI who presented to [facility] on 11/16/2016 with complaints of chest and back pain.    The patient reports intermittent chest and mid back discomfort over the past 48 hours. There is associated nausea and diaphoresis. No shortness of breath. His worsened this morning and she was brought into the emergency department. On arrival she is noted to be in complete heart block and she has significant ST changes on her EKG worrisome for acute coronary syndrome.  The patient has an extensive cardiac history. Her last cardiac catheterization in 2015 demonstrated patency at her previous stent sites with stable coronary artery disease otherwise. Medical therapy was indicated. She has been maintained on long-term dual antiplatelet therapy with aspirin and clopidogrel. She reports no recent bleeding problems. No history of stroke or TIA.  At the time of my interview at the bedside, the patient continues to complain of chest and back discomfort.   Past Medical History:  Diagnosis Date  . Arthritis   . Coronary artery disease 11/2009   2D Echo EF>55  . High cholesterol   . Hypertension   . Obesity   . Reflux esophagitis   . Sleep apnea 09/27/2010   Untreated, REM 64.7/hr AHI 18.5/hr RDI 19.2/hr. Patuent refused CPAP therapy     Surgical History:  Past Surgical History:  Procedure Laterality Date  . CHOLECYSTECTOMY    . CORONARY ANGIOPLASTY WITH STENT PLACEMENT  2007   A 3.0x65mm CYPHER stent post dilated to 3.29 mm the 100% occlusion was reduced to 0%  . LEFT HEART CATHETERIZATION WITH CORONARY ANGIOGRAM N/A 07/12/2014   Procedure: LEFT HEART CATHETERIZATION WITH CORONARY  ANGIOGRAM;  Surgeon: Micheline Chapman, MD;  Location: [facility] CATH LAB;  Service: Cardiovascular;  Laterality: N/A;     Home Meds: Prior to Admission medications   Medication Sig Start Date End Date Taking? Authorizing Provider  amLODipine (NORVASC) 5 MG tablet Take 5 mg by mouth daily. Take 1 tab daily 09/15/15   Historical Provider, MD  aspirin 81 MG tablet Take 81 mg by mouth daily.    Historical Provider, MD  carvedilol (COREG) 6.25 MG tablet Take 1 tablet (6.25 mg total) by mouth 2 (two) times daily. 01/31/16   Lennette Bihari, MD  chlorthalidone (HYGROTON) 25 MG tablet Take 0.5 tablets (12.5 mg total) by mouth daily. 09/02/15   Auburn Bilberry, MD  clopidogrel (PLAVIX) 75 MG tablet Take 1 tablet (75 mg total) by mouth daily. PLEASE CONTACT OFFICE FOR ADDITIONAL REFILLS 06/08/16   Lennette Bihari, MD  esomeprazole (NEXIUM) 40 MG capsule TAKE 1 CAPSULE (40 MG TOTAL) BY MOUTH DAILY BEFORE BREAKFAST. 01/06/16   Lennette Bihari, MD  fluticasone [facility]) 50 MCG/ACT nasal spray Place 1 spray into the nose as needed for allergies or rhinitis.  12/03/12   Historical Provider, MD  furosemide (LASIX) 40 MG tablet TAKE 1 TABLET BY MOUTH DAILY AS NEEDED FOR EDEMA 10/10/15   Lennette Bihari, MD  hydrALAZINE (APRESOLINE) 100 MG tablet Take 1 tablet (100 mg total) by mouth 3 (three) times daily. 01/27/16   Lennette Bihari, MD  Iron Succinyl-Protein Complex 40 MG/15ML SOLN Take 40 mg by mouth daily.    Historical Provider, MD  losartan (COZAAR) 50 MG tablet Take 1 tablet (50 mg total) by mouth daily. 08/10/16   Lennette Bihari, MD  naproxen sodium (ANAPROX) 220 MG tablet Take 440 mg by mouth 2 (two) times daily with a meal.    Historical Provider, MD  nitroGLYCERIN (NITROSTAT) 0.4 MG SL tablet PLACE 1 TABLET (0.4 MG TOTAL) UNDER THE TONGUE EVERY 5 (FIVE) MINUTES AS NEEDED. 10/08/16   Lennette Bihari, MD  rosuvastatin (CRESTOR) 40 MG tablet Take 1 tablet (40 mg total) by mouth daily. Patient taking differently: Take 40 mg by  mouth every other day.  11/26/14   Lennette Bihari, MD    Allergies:  Allergies  Allergen Reactions  . Contrast Media [Iodinated Diagnostic Agents] Nausea And Vomiting  . Codeine Nausea And Vomiting and Anxiety    Social History   Social History  . Marital status: Single    Spouse name: N/A  . Number of children: N/A  . Years of education: N/A   Occupational History  . Not on file.   Social History Main Topics  . Smoking status: Never Smoker  . Smokeless tobacco: Never Used  . Alcohol use No  . Drug use: No  . Sexual activity: Not on file   Other Topics Concern  . Not on file   Social History Narrative  . No narrative on file     Family History  Problem Relation Age of Onset  . Hypertension Other     Review of Systems: See history of present illness. Otherwise negative except as outlined  Physical Exam: Pleasant, obese elderly woman, ill-appearing Blood pressure (!) 85/66, pulse (!) 50, resp. rate 20. General: Mild to moderate acute distress. HEENT: Normocephalic, atraumatic, sclera anicteric Neck: Supple. Carotids 2+ without bruits. JVP normal Lungs: Clear bilaterally to auscultation without wheezes, rales, or rhonchi. Breathing is unlabored. Heart: Bradycardic and regular with normal S1 and S2. No murmurs, rubs, or gallops appreciated. Abdomen: Soft, non-tender, non-distended with normoactive bowel sounds. No hepatomegaly. No rebound/guarding. No obvious abdominal masses. Back: No CVA tenderness Msk:  Strength and tone appear normal for age. Extremities: No clubbing, cyanosis, or edema.  Distal pedal pulses are 2+ and equal bilaterally. Neuro: CNII-XII intact, moves all extremities spontaneously. Psych:  Responds to questions appropriately with a normal affect. Skin: warm and dry without rash   Labs:   Lab Results  Component Value Date   WBC 9.5 11/16/2016   HGB 12.9 11/16/2016   HCT 38.6 11/16/2016   MCV 86.2 11/16/2016   PLT 175 11/16/2016      Recent Labs Lab 11/16/16 1050  NA 135  K 3.7  CL 104  CO2 23  BUN 13  CREATININE 1.38*  CALCIUM 8.7*  GLUCOSE 141*   No results for input(s): CKTOTAL, CKMB, TROPONINI in the last 72 hours. Lab Results  Component Value Date   CHOL 146 01/19/2015   HDL 48 01/19/2015   LDLCALC 77 01/19/2015   TRIG 104 01/19/2015   Lab Results  Component Value Date   DDIMER 0.48 12/01/2011    Radiology/Studies:  No results found.   EKG: Complete AV block, HR 45 bpm, RBBB, ST-T changes consider posterior injury  CARDIAC STUDIES: Cardiac Cath 07-12-2014: Procedural Details: The right wrist was prepped, draped, and anesthetized with 1% lidocaine. Using the modified Seldinger technique, a 5/6 French Slender sheath was introduced into the right radial artery. 3 mg of verapamil was administered through the sheath, weight-based unfractionated heparin was administered intravenously. Standard Judkins catheters were  used for selective coronary angiography. Catheter exchanges were performed over an exchange length guidewire. There were no immediate procedural complications. A TR band was used for radial hemostasis at the completion of the procedure.  The patient was transferred to the post catheterization recovery area for further monitoring.  Procedural Findings: Hemodynamics: AO 183/75 LV 181/75  Coronary angiography: Coronary dominance: right  Left mainstem: Widely patent with nonobstructive irregularity and mild calcification  Left anterior descending (LAD): Prox vessel is widely patent. There is a large first diagonal without stenosis. The mid-vessel beyond the diagonal has an intramyocardial bridge. There is no obstructive disease in the LAD distribution.  Left circumflex (LCx): The ramus branch is patent with a widely patent stent. The circumflex is a dominant vessel with patent OM branches and a stable 50% stenosis in the distal AV circumflex before the left PDA. No change compared with  the 2007 study.  Right coronary artery (RCA): Moderate caliber, nondominant vessel with a patent stent in the mid-vessel.  Left ventriculography: deferred. LVEF 75% by echo  Estimated Blood Loss: minimal  Final Conclusions:   1. Double vessel  CAD with patent stents in the ramus and RCA 2. Widely patent LAD 3. Normal LV function by echo  Recommendations: continue medical therapy for CAD  Tonny BollmanMichael Marvon Shillingburg MD, [facility] 07/12/2014, 12:34 PM  Echo 08-29-2015: Study Conclusions  - Left ventricle: The cavity size was normal. There was mild   concentric hypertrophy. Systolic function was normal. Wall motion   was normal; there were no regional wall motion abnormalities.   Doppler parameters are consistent with abnormal left ventricular   relaxation (grade 1 diastolic dysfunction). - Aortic valve: There was mild regurgitation. - Mitral valve: There was mild regurgitation. - Left atrium: The atrium was normal in size. - Right ventricle: Systolic function was normal. - Pulmonary arteries: Systolic pressure was within the normal   range.  Impressions:  - Normal study.  ASSESSMENT AND PLAN:  1. Non-STEMI with ongoing ischemic symptoms: The patient has a proximally 48 hours of symptoms likely related to her MI. Her point-of-care troponin is 7.35. EKG is suggestive of posterior injury and she has a history of a ramus intermedius stent. Her previous cardiac catheterization study is reviewed. Emergency cardiac catheterization and PCI will be performed. Emergency implied consent is obtained. I explained the procedural risks, indications, and alternatives with the patient and her family.  2. Complete heart block: Patient with history of syncope last year. Now with complete AV block. May be related to her acute ischemic syndrome. Plan to insert a temporary transvenous pacemaker at the time of emergency cardiac catheterization.  3. Hypertension: Will adjust antihypertensive medications.  Likely will discontinue her beta blocker secondary to problem #2.  4. Hypercholesterolemia: Continue high intensity statin drug. Treated with Crestor 40 mg daily.  5. Chronic kidney disease stage III: Monitor creatinine closely. Appears to be at baseline today with a creatinine of 1.4 mg/dL. Will limit contrast is much as possible at the time of her cardiac catheterization.  Enzo BiSigned, Kaidon Kinker MD 11/16/2016, 11:50 AM

## 2016-11-16 NOTE — ED Triage Notes (Signed)
Pt c/o nausea/vomiting/diarrhea-- [facility] pt called daughter today and told her that she had chest pain.

## 2016-11-17 ENCOUNTER — Other Ambulatory Visit (HOSPITAL_COMMUNITY): Payer: Medicare Other

## 2016-11-17 DIAGNOSIS — I10 Essential (primary) hypertension: Secondary | ICD-10-CM

## 2016-11-17 DIAGNOSIS — E782 Mixed hyperlipidemia: Secondary | ICD-10-CM

## 2016-11-17 LAB — CBC
HCT: 35 % — ABNORMAL LOW (ref 36.0–46.0)
HCT: 35.1 % — ABNORMAL LOW (ref 36.0–46.0)
Hemoglobin: 11.9 g/dL — ABNORMAL LOW (ref 12.0–15.0)
Hemoglobin: 11.9 g/dL — ABNORMAL LOW (ref 12.0–15.0)
MCH: 28.6 pg (ref 26.0–34.0)
MCH: 28.8 pg (ref 26.0–34.0)
MCHC: 33.9 g/dL (ref 30.0–36.0)
MCHC: 34 g/dL (ref 30.0–36.0)
MCV: 84.4 fL (ref 78.0–100.0)
MCV: 84.7 fL (ref 78.0–100.0)
Platelets: 142 10*3/uL — ABNORMAL LOW (ref 150–400)
Platelets: 154 10*3/uL (ref 150–400)
RBC: 4.13 MIL/uL (ref 3.87–5.11)
RBC: 4.16 MIL/uL (ref 3.87–5.11)
RDW: 14.2 % (ref 11.5–15.5)
RDW: 14.5 % (ref 11.5–15.5)
WBC: 7.1 10*3/uL (ref 4.0–10.5)
WBC: 8.3 10*3/uL (ref 4.0–10.5)

## 2016-11-17 LAB — BASIC METABOLIC PANEL
Anion gap: 10 (ref 5–15)
BUN: 18 mg/dL (ref 6–20)
CO2: 20 mmol/L — ABNORMAL LOW (ref 22–32)
Calcium: 8.2 mg/dL — ABNORMAL LOW (ref 8.9–10.3)
Chloride: 105 mmol/L (ref 101–111)
Creatinine, Ser: 1.38 mg/dL — ABNORMAL HIGH (ref 0.44–1.00)
GFR calc Af Amer: 41 mL/min — ABNORMAL LOW (ref >60)
GFR calc non Af Amer: 35 mL/min — ABNORMAL LOW (ref >60)
Glucose, Bld: 166 mg/dL — ABNORMAL HIGH (ref 65–99)
Potassium: 3.3 mmol/L — ABNORMAL LOW (ref 3.5–5.1)
Sodium: 135 mmol/L (ref 135–145)

## 2016-11-17 LAB — LIPID PANEL
Cholesterol: 218 mg/dL — ABNORMAL HIGH (ref 0–200)
HDL: 37 mg/dL — ABNORMAL LOW (ref >40)
LDL Cholesterol: 163 mg/dL — ABNORMAL HIGH (ref 0–99)
Total CHOL/HDL Ratio: 5.9 RATIO
Triglycerides: 89 mg/dL (ref <150)
VLDL: 18 mg/dL (ref 0–40)

## 2016-11-17 NOTE — Progress Notes (Addendum)
Progress Note  Patient Name: Reginald Estill BambergM Lawlor Date of Encounter: 11/17/2016  Primary Cardiologist: Dr. Nicki Guadalajarahomas Kelly  Subjective   No chest pain or shortness of breath. Woke up early morning with difficulty sleeping. No abdominal pain, nausea or emesis.  Inpatient Medications    Scheduled Meds: . amLODipine  5 mg Oral Daily  . aspirin  324 mg Oral Once  . aspirin EC  81 mg Oral Daily  . heparin  5,000 Units Subcutaneous Q8H  . hydrALAZINE  100 mg Oral TID  . losartan  50 mg Oral Daily  . pantoprazole  40 mg Oral Daily  . rosuvastatin  40 mg Oral q1800  . sodium chloride flush  3 mL Intravenous Q12H  . ticagrelor  90 mg Oral BID    PRN Meds: sodium chloride, acetaminophen, nitroGLYCERIN, ondansetron (ZOFRAN) IV, sodium chloride flush   Vital Signs    Vitals:   11/17/16 0100 11/17/16 0200 11/17/16 0300 11/17/16 0402  BP: 106/88 103/74 115/60 115/60  Pulse: 69 70 70 69  Resp: (!) 21 19 (!) 30 20  Temp:      TempSrc:      SpO2: 99% 100% 100% 99%  Weight:      Height:        Intake/Output Summary (Last 24 hours) at 11/17/16 0729 Last data filed at 11/17/16 0300  Gross per 24 hour  Intake           248.35 ml  Output               50 ml  Net           198.35 ml   Filed Weights   11/16/16 1200 11/16/16 1341  Weight: 192 lb (87.1 kg) 184 lb 15.5 oz (83.9 kg)    Telemetry    Ventricular paced rhythm in the 70s. Personally reviewed.  ECG    ECG from 11/16/2016 shows ventricular pacing without atrial tracking. Personally reviewed.  Physical Exam   GEN: No acute distress.   Neck: No JVD Cardiac: RRR, no murmurs, rubs, or gallops. Right femoral venous temporary transvenous pacemaker in place. Respiratory: Clear to auscultation bilaterally. GI: Soft, nontender, non-distended  MS: No edema; No deformity. Neuro:  Nonfocal  Psych: Normal affect   Labs    Chemistry Recent Labs Lab 11/16/16 1050 11/16/16 1416 11/17/16 0530  NA 135  --  135  K 3.7  --  3.3*   CL 104  --  105  CO2 23  --  20*  GLUCOSE 141*  --  166*  BUN 13  --  18  CREATININE 1.38* 1.25* 1.38*  CALCIUM 8.7*  --  8.2*  GFRNONAA 35* 40* 35*  GFRAA 41* 46* 41*  ANIONGAP 8  --  10     Hematology Recent Labs Lab 11/16/16 1416 11/17/16 0010 11/17/16 0530  WBC 10.5 7.1 8.3  RBC 4.51 4.16 4.13  HGB 12.9 11.9* 11.9*  HCT 38.8 35.1* 35.0*  MCV 86.0 84.4 84.7  MCH 28.6 28.6 28.8  MCHC 33.2 33.9 34.0  RDW 14.4 14.2 14.5  PLT 150 142* 154    Cardiac Enzymes Recent Labs Lab 11/16/16 1416 11/16/16 1932  TROPONINI 7.56* 7.86*    Recent Labs Lab 11/16/16 1112  TROPIPOC 7.35*     Radiology    Dg Chest Port 1 View  Result Date: 11/16/2016 CLINICAL DATA:  Weakness, chest pain, preop cath EXAM: PORTABLE CHEST 1 VIEW COMPARISON:  05/02/2015 FINDINGS: Cardiomegaly. No frank interstitial  edema. No pleural effusion or pneumothorax. Defibrillator pad overlying the left hemithorax. IMPRESSION: No evidence of acute cardiopulmonary disease. Electronically Signed   By: Charline Bills M.D.   On: 11/16/2016 12:02    Cardiac Studies   Cardiac catheterization 11/16/2016: 1. Critical stenosis of the distal circumflex treated successfully with PCI using a 2.5 mm drug-eluting stent 2. Moderate mid LAD stenosis, does not appear to be flow-obstructive 3. Moderate proximal RCA stenosis (nondominant vessel) 4. Patent stents in the RCA and ramus intermedius 5. Normal LVEDP-will assess LV function by echo because of chronic kidney disease 6. Successful insertion of a temporary transvenous pacing wire for treatment of complete heart block  Dual antiplatelet therapy with aspirin and brilinta for at least one year, follow heart rhythm closely. EP service notified.  Patient Profile     83 y.o. female with a history of hypertension, hyperlipidemia, OSA and CAD with prior stent placement to the ramus intermedius and RCA, now presenting with NSTEMI associated with complete heart block.  Emergent cardiac catheterization on March 9 revealed critical stenosis within the distal circumflex was treated with DES, otherwise moderate residual disease and patent stent sites. Temporary transvenous pacing wire was placed and EP service consulted.  Assessment & Plan    1. NSTEMI, Troponin I up to 7.86. Culprit lesion was critical distal circumflex stenosis treated with DES on March 9. Echocardiogram pending for assessment of LVEF.  2. CAD status post prior stent placement to the ramus intermedius and RCA.  3. Complete heart block, temporary transvenous pacemaker in place and EP service consulted.  4. LDL 163, currently on Crestor 40 mg daily.  5. History of OSA.  I reviewed the chart, discussed with patient and daughter at bedside. She is hemodynamically stable at this time and chest pain-free. Plan to continue aspirin, Brilinta, Crestor, Cozaar, hydralazine, and Norvasc. No beta blocker with presenting complete heart block and transvenous pacemaker in place. Pacing turned down from 70-55, she does have intrinsic conduction in the 60s without persistent complete heart block at this time. EP service has been consulted for possibility of permanent pacemaker if conduction does not improve with resolution of ACS. Follow-up echocardiogram to assess LVEF. Keep an unit for now.  Signed, Nona Dell, MD  11/17/2016, 7:29 AM

## 2016-11-17 NOTE — Progress Notes (Signed)
Progress Note  Patient Name: Canaan Estill BambergM Busic Date of Encounter: 11/17/2016  Primary Cardiologist: Dr. Nicki Guadalajarahomas Kelly  Subjective   No chest pain or shortness of breath. Woke up early morning with difficulty sleeping. No abdominal pain, nausea or emesis.   Inpatient Medications    Scheduled Meds: . amLODipine  5 mg Oral Daily  . aspirin  324 mg Oral Once  . aspirin EC  81 mg Oral Daily  . heparin  5,000 Units Subcutaneous Q8H  . hydrALAZINE  100 mg Oral TID  . losartan  50 mg Oral Daily  . pantoprazole  40 mg Oral Daily  . rosuvastatin  40 mg Oral q1800  . sodium chloride flush  3 mL Intravenous Q12H  . ticagrelor  90 mg Oral BID    PRN Meds: sodium chloride, acetaminophen, nitroGLYCERIN, ondansetron (ZOFRAN) IV, sodium chloride flush   Vital Signs    Vitals:   11/17/16 0800 11/17/16 0850 11/17/16 1125 11/17/16 1225  BP: 109/74 (!) 159/68 114/71 116/64  Pulse: 74 76 77 79  Resp: 18 18 (!) 22 20  Temp:    98.6 F (37 C)  TempSrc:  Oral  Oral  SpO2: 100% 100% 100% 99%  Weight:      Height:        Intake/Output Summary (Last 24 hours) at 11/17/16 1249 Last data filed at 11/17/16 1100  Gross per 24 hour  Intake           688.35 ml  Output               50 ml  Net           638.35 ml   Filed Weights   11/16/16 1200 11/16/16 1341  Weight: 192 lb (87.1 kg) 184 lb 15.5 oz (83.9 kg)    Telemetry    Sinus rhythm with some Mobitz 1 physiology  ECG       Physical Exam   GEN: No acute distress.   Neck: No JVD Cardiac: RRR, no murmurs, rubs, or gallops. Right femoral venous temporary transvenous pacemaker in place. Respiratory: Clear to auscultation bilaterally. GI: Soft, nontender, non-distended  MS: No edema; No deformity. Neuro:  Nonfocal  Psych: Normal affect   Labs    Chemistry  Recent Labs Lab 11/16/16 1050 11/16/16 1416 11/17/16 0530  NA 135  --  135  K 3.7  --  3.3*  CL 104  --  105  CO2 23  --  20*  GLUCOSE 141*  --  166*  BUN 13  --   18  CREATININE 1.38* 1.25* 1.38*  CALCIUM 8.7*  --  8.2*  GFRNONAA 35* 40* 35*  GFRAA 41* 46* 41*  ANIONGAP 8  --  10     Hematology  Recent Labs Lab 11/16/16 1416 11/17/16 0010 11/17/16 0530  WBC 10.5 7.1 8.3  RBC 4.51 4.16 4.13  HGB 12.9 11.9* 11.9*  HCT 38.8 35.1* 35.0*  MCV 86.0 84.4 84.7  MCH 28.6 28.6 28.8  MCHC 33.2 33.9 34.0  RDW 14.4 14.2 14.5  PLT 150 142* 154    Cardiac Enzymes  Recent Labs Lab 11/16/16 1416 11/16/16 1932  TROPONINI 7.56* 7.86*     Recent Labs Lab 11/16/16 1112  TROPIPOC 7.35*     Radiology    Dg Chest Port 1 View  Result Date: 11/16/2016 CLINICAL DATA:  Weakness, chest pain, preop cath EXAM: PORTABLE CHEST 1 VIEW COMPARISON:  05/02/2015 FINDINGS: Cardiomegaly. No frank interstitial edema. No  pleural effusion or pneumothorax. Defibrillator pad overlying the left hemithorax. IMPRESSION: No evidence of acute cardiopulmonary disease. Electronically Signed   By: Charline Bills M.D.   On: 11/16/2016 12:02    Cardiac Studies   Cardiac catheterization 11/16/2016: 1. Critical stenosis of the distal circumflex treated successfully with PCI using a 2.5 mm drug-eluting stent 2. Moderate mid LAD stenosis, does not appear to be flow-obstructive 3. Moderate proximal RCA stenosis (nondominant vessel) 4. Patent stents in the RCA and ramus intermedius 5. Normal LVEDP-will assess LV function by echo because of chronic kidney disease 6. Successful insertion of a temporary transvenous pacing wire for treatment of complete heart block  Dual antiplatelet therapy with aspirin and brilinta for at least one year, follow heart rhythm closely. EP service notified.  Patient Profile     83 y.o. female with a history of hypertension, hyperlipidemia, OSA and CAD with prior stent placement to the ramus intermedius and RCA, now presenting with NSTEMI associated with complete heart block. Emergent cardiac catheterization on March 9 revealed critical stenosis  within the distal circumflex was treated with DES, otherwise moderate residual disease and patent stent sites. Temporary transvenous pacing wire was placed and EP service consulted.  Assessment & Plan       Complete heart block-transient  Non-STEMI status post stenting of dominant circumflex  The patient's conduction has improved considerably. There is still some Mobitz 1 physiology. We will anticipate removing temporary pacemaker tomorrow with subsequent ambulation and hopefully home on Monday  Signed, Sherryl Manges, MD  11/17/2016, 12:49 PM

## 2016-11-18 ENCOUNTER — Encounter (HOSPITAL_COMMUNITY): Payer: Self-pay | Admitting: *Deleted

## 2016-11-18 ENCOUNTER — Inpatient Hospital Stay (HOSPITAL_COMMUNITY): Payer: Medicare Other

## 2016-11-18 DIAGNOSIS — R079 Chest pain, unspecified: Secondary | ICD-10-CM

## 2016-11-18 LAB — BASIC METABOLIC PANEL
Anion gap: 8 (ref 5–15)
BUN: 24 mg/dL — ABNORMAL HIGH (ref 6–20)
CO2: 22 mmol/L (ref 22–32)
Calcium: 8.3 mg/dL — ABNORMAL LOW (ref 8.9–10.3)
Chloride: 105 mmol/L (ref 101–111)
Creatinine, Ser: 1.45 mg/dL — ABNORMAL HIGH (ref 0.44–1.00)
GFR calc Af Amer: 38 mL/min — ABNORMAL LOW (ref >60)
GFR calc non Af Amer: 33 mL/min — ABNORMAL LOW (ref >60)
Glucose, Bld: 124 mg/dL — ABNORMAL HIGH (ref 65–99)
Potassium: 3.4 mmol/L — ABNORMAL LOW (ref 3.5–5.1)
Sodium: 135 mmol/L (ref 135–145)

## 2016-11-18 LAB — CBC
HCT: 35.6 % — ABNORMAL LOW (ref 36.0–46.0)
Hemoglobin: 12.1 g/dL (ref 12.0–15.0)
MCH: 28.7 pg (ref 26.0–34.0)
MCHC: 34 g/dL (ref 30.0–36.0)
MCV: 84.4 fL (ref 78.0–100.0)
Platelets: 164 10*3/uL (ref 150–400)
RBC: 4.22 MIL/uL (ref 3.87–5.11)
RDW: 14.4 % (ref 11.5–15.5)
WBC: 10.5 10*3/uL (ref 4.0–10.5)

## 2016-11-18 LAB — ECHOCARDIOGRAM COMPLETE
Height: 65 in
Weight: 2959.46 oz

## 2016-11-18 LAB — TROPONIN I: Troponin I: 7.89 ng/mL (ref <0.03)

## 2016-11-18 NOTE — Progress Notes (Signed)
Progress Note  Patient Name: [name] Estill BambergM Fister Date of Encounter: 11/18/2016  Primary Cardiologist: Dr. Nicki Guadalajarahomas Kelly  Subjective   No chest pain or shortness of breath. No lightheadedness. No abdominal pain.  Inpatient Medications    Scheduled Meds: . amLODipine  5 mg Oral Daily  . aspirin  324 mg Oral Once  . aspirin EC  81 mg Oral Daily  . heparin  5,000 Units Subcutaneous Q8H  . hydrALAZINE  100 mg Oral TID  . losartan  50 mg Oral Daily  . pantoprazole  40 mg Oral Daily  . rosuvastatin  40 mg Oral q1800  . sodium chloride flush  3 mL Intravenous Q12H  . ticagrelor  90 mg Oral BID    PRN Meds: sodium chloride, acetaminophen, nitroGLYCERIN, ondansetron (ZOFRAN) IV, sodium chloride flush   Vital Signs    Vitals:   11/18/16 0434 11/18/16 0500 11/18/16 0600 11/18/16 0700  BP: (!) 107/40 132/71 130/87 133/69  Pulse: 73 (!) 59 68 67  Resp: 16 14 17  (!) 28  Temp: 98.5 F (36.9 C)     TempSrc: Oral     SpO2: 99% 100% 100% 99%  Weight:      Height:        Intake/Output Summary (Last 24 hours) at 11/18/16 0736 Last data filed at 11/18/16 0700  Gross per 24 hour  Intake             1330 ml  Output              300 ml  Net             1030 ml   Filed Weights   11/16/16 1200 11/16/16 1341  Weight: 192 lb (87.1 kg) 184 lb 15.5 oz (83.9 kg)    Telemetry    Sinus rhythm with improvement in PR prolongation, no pauses. Personally reviewed.  ECG    ECG from 11/17/2016 shows sinus rhythm with significant PR prolongation and intermittent Mobitz 1 block with intermittent ventricular pacing. Personally reviewed.  Physical Exam   GEN: No acute distress.   Neck: No JVD Cardiac: RRR, no murmurs, rubs, or gallops. Right femoral venous temporary transvenous pacemaker in place. Respiratory: Clear to auscultation bilaterally. GI: Soft, nontender, non-distended  MS: No edema; No deformity. Neuro:  Nonfocal  Psych: Normal affect   Labs    Chemistry  Recent Labs Lab  11/16/16 1050 11/16/16 1416 11/17/16 0530 11/18/16 0405  NA 135  --  135 135  K 3.7  --  3.3* 3.4*  CL 104  --  105 105  CO2 23  --  20* 22  GLUCOSE 141*  --  166* 124*  BUN 13  --  18 24*  CREATININE 1.38* 1.25* 1.38* 1.45*  CALCIUM 8.7*  --  8.2* 8.3*  GFRNONAA 35* 40* 35* 33*  GFRAA 41* 46* 41* 38*  ANIONGAP 8  --  10 8     Hematology  Recent Labs Lab 11/17/16 0010 11/17/16 0530 11/18/16 0405  WBC 7.1 8.3 10.5  RBC 4.16 4.13 4.22  HGB 11.9* 11.9* 12.1  HCT 35.1* 35.0* 35.6*  MCV 84.4 84.7 84.4  MCH 28.6 28.8 28.7  MCHC 33.9 34.0 34.0  RDW 14.2 14.5 14.4  PLT 142* 154 164    Cardiac Enzymes  Recent Labs Lab 11/16/16 1416 11/16/16 1932 11/18/16 0405  TROPONINI 7.56* 7.86* 7.89*     Recent Labs Lab 11/16/16 1112  TROPIPOC 7.35*     Radiology  Dg Chest Port 1 View  Result Date: 11/16/2016 CLINICAL DATA:  Weakness, chest pain, preop cath EXAM: PORTABLE CHEST 1 VIEW COMPARISON:  05/02/2015 FINDINGS: Cardiomegaly. No frank interstitial edema. No pleural effusion or pneumothorax. Defibrillator pad overlying the left hemithorax. IMPRESSION: No evidence of acute cardiopulmonary disease. Electronically Signed   By: Charline Bills M.D.   On: 11/16/2016 12:02    Cardiac Studies   Cardiac catheterization 11/16/2016: 1. Critical stenosis of the distal circumflex treated successfully with PCI using a 2.5 mm drug-eluting stent 2. Moderate mid LAD stenosis, does not appear to be flow-obstructive 3. Moderate proximal RCA stenosis (nondominant vessel) 4. Patent stents in the RCA and ramus intermedius 5. Normal LVEDP-will assess LV function by echo because of chronic kidney disease 6. Successful insertion of a temporary transvenous pacing wire for treatment of complete heart block  Dual antiplatelet therapy with aspirin and brilinta for at least one year, follow heart rhythm closely. EP service notified.  Patient Profile     83 y.o. female with a history of  hypertension, hyperlipidemia, OSA and CAD with prior stent placement to the ramus intermedius and RCA, now presenting with NSTEMI associated with complete heart block. Emergent cardiac catheterization on March 9 revealed critical stenosis within the distal circumflex was treated with DES, otherwise moderate residual disease and patent stent sites. Temporary transvenous pacing wire was placed and EP service consulted.  Assessment & Plan    1. NSTEMI, Troponin I up to 7.86. Culprit lesion was critical distal circumflex stenosis treated with DES on March 9. Echocardiogram pending for assessment of LVEF.  2. CAD status post prior stent placement to the ramus intermedius and RCA.  3. Complete heart block, resolved. Seen by Dr. Graciela Husbands yesterday. Temporary transvenous pacemaker in place, however intrinsic conduction has improved substantially.  4. LDL 163, currently on Crestor 40 mg daily.  5. History of OSA.  6. CKD, stage 3. Creatinine 1.4.  Removing transvenous pacemaker today. Increase activity and observe in unit. Plan to continue aspirin, Brilinta, Crestor, Cozaar, hydralazine, and Norvasc. No beta blocker with recent complete heart block, although may be able to consider eventually. Follow-up echocardiogram to assess LVEF. Possibly home in the next 24-48 hours.  Signed, Nona Dell, MD  11/18/2016, 7:36 AM

## 2016-11-19 ENCOUNTER — Encounter (HOSPITAL_COMMUNITY): Payer: Self-pay | Admitting: *Deleted

## 2016-11-19 LAB — CBC
HCT: 35.1 % — ABNORMAL LOW (ref 36.0–46.0)
Hemoglobin: 11.7 g/dL — ABNORMAL LOW (ref 12.0–15.0)
MCH: 28.2 pg (ref 26.0–34.0)
MCHC: 33.3 g/dL (ref 30.0–36.0)
MCV: 84.6 fL (ref 78.0–100.0)
Platelets: 182 10*3/uL (ref 150–400)
RBC: 4.15 MIL/uL (ref 3.87–5.11)
RDW: 14.4 % (ref 11.5–15.5)
WBC: 7.3 10*3/uL (ref 4.0–10.5)

## 2016-11-19 LAB — BASIC METABOLIC PANEL
Anion gap: 8 (ref 5–15)
BUN: 17 mg/dL (ref 6–20)
CO2: 23 mmol/L (ref 22–32)
Calcium: 8.2 mg/dL — ABNORMAL LOW (ref 8.9–10.3)
Chloride: 108 mmol/L (ref 101–111)
Creatinine, Ser: 1.18 mg/dL — ABNORMAL HIGH (ref 0.44–1.00)
GFR calc Af Amer: 49 mL/min — ABNORMAL LOW (ref >60)
GFR calc non Af Amer: 42 mL/min — ABNORMAL LOW (ref >60)
Glucose, Bld: 104 mg/dL — ABNORMAL HIGH (ref 65–99)
Potassium: 3.6 mmol/L (ref 3.5–5.1)
Sodium: 139 mmol/L (ref 135–145)

## 2016-11-19 MED ORDER — HYDRALAZINE HCL 50 MG PO TABS
50.0000 mg | ORAL_TABLET | Freq: Three times a day (TID) | ORAL | Status: DC
  Administered 2016-11-19 – 2016-11-20 (×4): 50 mg via ORAL
  Filled 2016-11-19 (×4): qty 1

## 2016-11-19 NOTE — Progress Notes (Signed)
CSW consulted to assist pt with note for court date on 3/14.  CSW faxed letter to court and provided pt with copy of letter in case it is needed  csw signing off  Burna SisJenna H. Brittinie Wherley, LCSW Clinical Social Worker [phone]

## 2016-11-19 NOTE — Progress Notes (Signed)
Progress Note  Patient Name: [name] Date of Encounter: 11/19/2016  Primary Cardiologist: Dr. Daphene Jaeger  Subjective   Postop day 3 circumflex PCI/drug eluding stent in the setting of a STEMI with complete heart block. The patient denies chest pain or shortness of breath.  Inpatient Medications    Scheduled Meds: . amLODipine  5 mg Oral Daily  . aspirin  324 mg Oral Once  . aspirin EC  81 mg Oral Daily  . heparin  5,000 Units Subcutaneous Q8H  . hydrALAZINE  50 mg Oral TID  . losartan  50 mg Oral Daily  . pantoprazole  40 mg Oral Daily  . rosuvastatin  40 mg Oral q1800  . sodium chloride flush  3 mL Intravenous Q12H  . ticagrelor  90 mg Oral BID   Continuous Infusions:  PRN Meds: sodium chloride, acetaminophen, nitroGLYCERIN, ondansetron (ZOFRAN) IV, sodium chloride flush   Vital Signs    Vitals:   11/18/16 2100 11/18/16 2128 11/18/16 2334 11/19/16 0405  BP: 92/66 (!) 120/57 (!) 115/59 125/61  Pulse:      Resp: 12  (!) 24 19  Temp:   98.5 F (36.9 C)   TempSrc:   Oral   SpO2:   98% 98%  Weight:      Height:        Intake/Output Summary (Last 24 hours) at 11/19/16 0823 Last data filed at 11/19/16 0600  Gross per 24 hour  Intake              840 ml  Output             1050 ml  Net             -210 ml   Filed Weights   11/16/16 1200 11/16/16 1341  Weight: 192 lb (87.1 kg) 184 lb 15.5 oz (83.9 kg)    Telemetry    Normal sinus rhythm - Personally Reviewed  ECG    Not performed today - Personally Reviewed  Physical Exam   GEN: No acute distress.   Neck: No JVD Cardiac: RRR, no murmurs, rubs, or gallops.  Respiratory: Clear to auscultation bilaterally. GI: Soft, nontender, non-distended  MS: No edema; No deformity. Neuro:  Nonfocal  Psych: Normal affect   Labs    Chemistry Recent Labs Lab 11/17/16 0530 11/18/16 0405 11/19/16 0156  NA 135 135 139  K 3.3* 3.4* 3.6  CL 105 105 108  CO2 20* 22 23  GLUCOSE 166* 124* 104*  BUN 18  24* 17  CREATININE 1.38* 1.45* 1.18*  CALCIUM 8.2* 8.3* 8.2*  GFRNONAA 35* 33* 42*  GFRAA 41* 38* 49*  ANIONGAP 10 8 8      Hematology Recent Labs Lab 11/17/16 0530 11/18/16 0405 11/19/16 0156  WBC 8.3 10.5 7.3  RBC 4.13 4.22 4.15  HGB 11.9* 12.1 11.7*  HCT 35.0* 35.6* 35.1*  MCV 84.7 84.4 84.6  MCH 28.8 28.7 28.2  MCHC 34.0 34.0 33.3  RDW 14.5 14.4 14.4  PLT 154 164 182    Cardiac Enzymes Recent Labs Lab 11/16/16 1416 11/16/16 1932 11/18/16 0405  TROPONINI 7.56* 7.86* 7.89*    Recent Labs Lab 11/16/16 1112  TROPIPOC 7.35*     BNPNo results for input(s): BNP, PROBNP in the last 168 hours.   DDimer No results for input(s): DDIMER in the last 168 hours.   Radiology    No results found.  Cardiac Studies   Cardiac catheterization 11/16/2016: 1. Critical stenosis of the  distal circumflex treated successfully with PCI using a 2.5 mm drug-eluting stent 2. Moderate mid LAD stenosis, does not appear to be flow-obstructive 3. Moderate proximal RCA stenosis (nondominant vessel) 4. Patent stents in the RCA and ramus intermedius 5. Normal LVEDP-will assess LV function by echo because of chronic kidney disease 6. Successful insertion of a temporary transvenous pacing wire for treatment of complete heart block   2-D echocardiogram 11/18/16  Impressions:  - Severe LVH with LVEF 65-70% and grade 1 diastolic dysfunction.   Mild mitral regurgitation. Sclerotic aortic valve with mild   aortic regurgitation. Trivial tricuspid regurgitation with PASP   22 mmHg. A small pericardial effusion was identified   circumferential to the heart. Somewhat larger collection anterior   to the apical right ventricle. No obvious hemodynamic compromise.   With recent temporary transvenous pacemaker in place, consider a   follow-up study to reassess.  Patient Profile    83 y.o. female with a history of hypertension, hyperlipidemia, OSA and CAD with prior stent placement to the ramus  intermedius and RCA, now presenting with NSTEMI associated with complete heart block. Emergent cardiac catheterization on March 9 revealed critical stenosis within the distal circumflex was treated with DES, otherwise moderate residual disease and patent stent sites. Temporary transvenous pacing wire was placed and EP service consulted. Ultimately, the temporary transvenous pacemaker was discontinued. The patient is out of bed in a chair. She is on dual antibiotic therapy and denies chest pain or shortness of breath.   Assessment & Plan    1: Non-STEMI-history of prior stenting in the past. She presented 11/16/16 with non-STEMI and was taken to the Cath Lab by Dr. Excell Seltzerooper. Her previously placed stents are widely patent. She did have a 90% stenosis in the mid AV groove, and circumflex which was stented with a drug-eluting stent. She remains on dual antiplatelet therapy.  2: Complete heart block-the patient was initially in complete heart block which has resolved sensory vascularization. Her temporary transvenous pacemaker has been discontinued and she remains in sinus rhythm. Agree with avoidance of beta blockers at this time.  3: Hyperlipidemia- history of hyperlipidemia with total cholesterol performed 11/17/16 of 218 with an LDL of 163 on Crestor 40 mg. This will be followed up as an outpatient.  The patient will be transferred to a telemetry bed today. Cardiac rehabilitation will be consulted. Could. She'll be ambulated with the intent of potentially discharge in the morning.  Alphonsus SiasSigned, Demon Volante, MD  11/19/2016, 8:23 AM

## 2016-11-19 NOTE — Progress Notes (Signed)
CARDIAC REHAB PHASE I   PRE:  Rate/Rhythm: 71 first deg    BP: sitting 119/61    SaO2: 98 RA  MODE:  Ambulation: 100 ft   POST:  Rate/Rhythm: 116 first degree with PVCs    BP: sitting 126/73     SaO2: 98 RA  Pt unsteady on her feet due to bad knees. She gets DOE/fatigued quickly, presumably due to deconditioning and increased HR with activity. Used RW. She has a rollator and cane. To recliner. Pt likes to be independent. Frustrated by her progress. Her daughter lives with her. Will f/u. [phone]   Harriet Massonandi Kristan Malayjah Otoole CES, ACSM 11/19/2016 12:23 PM

## 2016-11-20 ENCOUNTER — Telehealth: Payer: Self-pay

## 2016-11-20 MED ORDER — TICAGRELOR 90 MG PO TABS: 90.0000 mg | ORAL_TABLET | Freq: Two times a day (BID) | ORAL | 3 refills | Status: DC

## 2016-11-20 MED ORDER — HYDRALAZINE HCL 100 MG PO TABS: 50.0000 mg | ORAL_TABLET | Freq: Three times a day (TID) | ORAL | 6 refills | Status: DC

## 2016-11-20 MED ORDER — PANTOPRAZOLE SODIUM 40 MG PO TBEC: 40.0000 mg | DELAYED_RELEASE_TABLET | Freq: Every day | ORAL | 6 refills | Status: DC

## 2016-11-20 MED ORDER — ROSUVASTATIN CALCIUM 40 MG PO TABS: 40.0000 mg | ORAL_TABLET | Freq: Every day | ORAL | 3 refills | Status: DC

## 2016-11-20 NOTE — Telephone Encounter (Signed)
[facility]/.  Patient understands to follow up with provider Harriet PhoK Lawrence NP on 11/26/16 at 230 [facility]. Patient understands discharge instructions? yes Patient understands medications and regiment? yes Patient understands to bring all medications to this visit? yes  I spoke with daughter Ruthe Mannannna Freshour

## 2016-11-20 NOTE — Discharge Summary (Signed)
Discharge Summary    Patient ID: [name],  MRN: [id number], DOB/AGE: 81/05/1934 81 y.o.  Admit date: 11/16/2016 Discharge date: 11/20/2016  Primary Care Provider: Assunta FoundGOLDING, JOHN [name] Primary Cardiologist: Dr. Tresa EndoKelly ([place] in [place] and likely followed up there with Dr. Diona BrownerMcDowell who saw her during this admission)  Discharge Diagnoses    Active Problems:   Complete heart block (HCC)   Non-ST elevation (NSTEMI) myocardial infarction (HCC)   HLD   HTN  Allergies Allergies  Allergen Reactions  . Atorvastatin Nausea And Vomiting  . Contrast Media [Iodinated Diagnostic Agents] Nausea And Vomiting  . Darvon [Propoxyphene] Nausea And Vomiting  . Codeine Nausea And Vomiting and Anxiety    Diagnostic Studies/Procedures   Coronary Stent Intervention  11/16/16  Left Heart Cath and Coronary Angiography  Temporary Pacemaker  Conclusion   1. Critical stenosis of the distal circumflex treated successfully with PCI using a 2.5 mm drug-eluting stent 2. Moderate mid LAD stenosis, does not appear to be flow-obstructive 3. Moderate proximal RCA stenosis (nondominant vessel) 4. Patent stents in the RCA and ramus intermedius 5. Normal LVEDP-will assess LV function by echo because of chronic kidney disease 6. Successful insertion of a temporary transvenous pacing wire for treatment of complete heart block  Dual antiplatelet therapy with aspirin and brilinta for at least one year, follow heart rhythm closely. EP service notified.    Echo 11/18/16 Study Conclusions  - Left ventricle: The cavity size was normal. Wall thickness was increased in a pattern of severe LVH. Systolic function was vigorous. The estimated ejection fraction was in the range of 65% to 70%. Wall motion was normal; there were no regional wall motion abnormalities. Doppler parameters are consistent with abnormal left ventricular relaxation (grade 1 diastolic dysfunction). - Aortic valve:  Mildly calcified annulus. Trileaflet; mildly calcified leaflets. There was mild regurgitation. Mean gradient (S): 8 mm Hg. Peak gradient (S): 17 mm Hg. VTI ratio of LVOT to aortic valve: 0.71. - Mitral valve: Calcified annulus. There was mild regurgitation. - Right atrium: Central venous pressure (est): 3 mm Hg. - Atrial septum: No defect or patent foramen ovale was identified. - Tricuspid valve: There was trivial regurgitation. - Pulmonary arteries: PA peak pressure: 22 mm Hg (S). - Pericardium, extracardiac: A small pericardial effusion was identified circumferential to the heart. Somewhat larger collection anterior to the apical right ventricle. No obvious hemodynamic compromise. With recent temporary transvenous pacemaker in place, consider a follow-up study to reassess.  Impressions:  - Severe LVH with LVEF 65-70% and grade 1 diastolic dysfunction. Mild mitral regurgitation. Sclerotic aortic valve with mild aortic regurgitation. Trivial tricuspid regurgitation with PASP 22 mmHg. A small pericardial effusion was identified circumferential to the heart. Somewhat larger collection anterior to the apical right ventricle. No obvious hemodynamic compromise. With recent temporary transvenous pacemaker in place, consider a follow-up study to reassess.    History of Present Illness     83 y.o.femalewith a history of hypertension, hyperlipidemia, OSA and CAD with prior stent placement to the ramus intermedius and RCA,   with NSTEMI associated with complete heart block.   Hospital Course     Consultants: None  1: Non-STEMI-history of prior stenting in the past. She presented 11/16/16 with non-STEMI and was taken to the Cath Lab that showed widely patent stents. She did have a 90% stenosis in the mid AV groove, and circumflex which was stented with a drug-eluting stent. She remains on dual antiplatelet therapy. Plavix changed to Brillinta. Peak  of troponin  7.89. Remained chest pain free.  Echo showed normal EF. EKG 11/20/16 showed new ST depression in lateral leads which improved next day. Continue ASA, Brilinta and statin.   2: Complete heart block-the patient was initially in complete heart block which has resolved with vascularization. Her temporary transvenous pacemaker has been discontinued and she remains in sinus rhythm. Avoidance of beta blockers at this time.  3: Hyperlipidemia 11/17/2016: Cholesterol 218; HDL 37; LDL Cholesterol 163; Triglycerides 89; VLDL 18  - On home Crestor 40mg  qd. LDL goal less than 70. Consider adding Zetia/Repatha as outpatient.   4. HTN - Intermittently soft low. Meds adjusted. Keep log.   Plan for CRPII  few weeks to monitor rhythm, then resume Flexogenics for PT.   The patient has been seen by Dr. Allyson Sabal  today and deemed ready for discharge home. All follow-up appointments have been scheduled. Discharge medications are listed below.    Discharge Vitals Blood pressure (!) 118/56, pulse 76, temperature 99 F (37.2 C), temperature source Oral, resp. rate 18, height 5\' 5"  (1.651 m), weight 181 lb 6.4 oz (82.3 kg), SpO2 100 %.  Filed Weights   11/16/16 1200 11/16/16 1341 11/20/16 0546  Weight: 192 lb (87.1 kg) 184 lb 15.5 oz (83.9 kg) 181 lb 6.4 oz (82.3 kg)    Labs & Radiologic Studies     CBC  Recent Labs  11/18/16 0405 11/19/16 0156  WBC 10.5 7.3  HGB 12.1 11.7*  HCT 35.6* 35.1*  MCV 84.4 84.6  PLT 164 182   Basic Metabolic Panel  Recent Labs  11/18/16 0405 11/19/16 0156  NA 135 139  K 3.4* 3.6  CL 105 108  CO2 22 23  GLUCOSE 124* 104*  BUN 24* 17  CREATININE 1.45* 1.18*  CALCIUM 8.3* 8.2*   Liver Function Tests No results for input(s): AST, ALT, ALKPHOS, BILITOT, PROT, ALBUMIN in the last 72 hours. No results for input(s): LIPASE, AMYLASE in the last 72 hours. Cardiac Enzymes  Recent Labs  11/18/16 0405  TROPONINI 7.89*   BNP Invalid input(s): POCBNP D-Dimer No  results for input(s): DDIMER in the last 72 hours. Hemoglobin A1C No results for input(s): HGBA1C in the last 72 hours. Fasting Lipid Panel No results for input(s): CHOL, HDL, LDLCALC, TRIG, CHOLHDL, LDLDIRECT in the last 72 hours. Thyroid Function Tests No results for input(s): TSH, T4TOTAL, T3FREE, THYROIDAB in the last 72 hours.  Invalid input(s): FREET3  Dg Chest Port 1 View  Result Date: 11/16/2016 CLINICAL DATA:  Weakness, chest pain, preop cath EXAM: PORTABLE CHEST 1 VIEW COMPARISON:  05/02/2015 FINDINGS: Cardiomegaly. No frank interstitial edema. No pleural effusion or pneumothorax. Defibrillator pad overlying the left hemithorax. IMPRESSION: No evidence of acute cardiopulmonary disease. Electronically Signed   By: Charline Bills M.D.   On: 11/16/2016 12:02    Disposition   Pt is being discharged home today in good condition.  Follow-up Plans & Appointments    Follow-up Information    Joni Reining, NP. Go on 11/26/2016.   Specialties:  Nurse Practitioner, Radiology, Cardiology Why:  @2 :30 for TCM  Contact information: [address] [place] [address] [phone]          Discharge Instructions    Amb Referral to Cardiac Rehabilitation    Complete by:  As directed    Diagnosis:   Coronary Stents NSTEMI PTCA     Diet - low sodium heart healthy    Complete by:  As directed    Discharge  instructions    Complete by:  As directed    No driving for 2 weeks. No lifting over 10 lbs for 4 weeks. No sexual activity for 4 weeks.  Keep procedure site clean & dry. If you notice increased pain, swelling, bleeding or pus, call/return!  You may shower, but no soaking baths/hot tubs/pools for 1 week.   Increase activity slowly    Complete by:  As directed       Discharge Medications   Current Discharge Medication List    START taking these medications   Details  pantoprazole (PROTONIX) 40 MG tablet Take 1 tablet (40 mg total) by mouth daily. Qty: 30 tablet,  Refills: 6    ticagrelor (BRILINTA) 90 MG TABS tablet Take 1 tablet (90 mg total) by mouth 2 (two) times daily. Qty: 180 tablet, Refills: 3      CONTINUE these medications which have CHANGED   Details  hydrALAZINE (APRESOLINE) 100 MG tablet Take 0.5 tablets (50 mg total) by mouth 3 (three) times daily. Qty: 45 tablet, Refills: 6      CONTINUE these medications which have NOT CHANGED   Details  acetaminophen (TYLENOL) 325 MG tablet Take 325-650 mg by mouth every 6 (six) hours as needed (for headaches).    amLODipine (NORVASC) 5 MG tablet Take 5 mg by mouth daily.  Refills: 3    aspirin 81 MG tablet Take 81 mg by mouth daily.    fluticasone (FLONASE) 50 MCG/ACT nasal spray Place 1 spray into both nostrils as needed for allergies or rhinitis.     furosemide (LASIX) 40 MG tablet TAKE 1 TABLET BY MOUTH DAILY AS NEEDED FOR EDEMA Qty: 30 tablet, Refills: 0    Iron Succinyl-Protein Complex 40 MG/15ML SOLN Take 40 mg by mouth daily.    losartan (COZAAR) 50 MG tablet Take 1 tablet (50 mg total) by mouth daily. Qty: 30 tablet, Refills: 5    nitroGLYCERIN (NITROSTAT) 0.4 MG SL tablet PLACE 1 TABLET (0.4 MG TOTAL) UNDER THE TONGUE EVERY 5 (FIVE) MINUTES AS NEEDED. Qty: 25 tablet, Refills: 0    Vitamin D, Ergocalciferol, (DRISDOL) 50000 units CAPS capsule Take 50,000 Units by mouth every 30 (thirty) days. Refills: 0    rosuvastatin (CRESTOR) 40 MG tablet Take 1 tablet (40 mg total) by mouth daily. Qty: 90 tablet, Refills: 3      STOP taking these medications     carvedilol (COREG) 6.25 MG tablet      clopidogrel (PLAVIX) 75 MG tablet      esomeprazole (NEXIUM) 40 MG capsule      chlorthalidone (HYGROTON) 25 MG tablet          Aspirin prescribed at discharge?  Yes High Intensity Statin Prescribed? (Lipitor 40-80mg  or Crestor 20-40mg ): Yes Beta Blocker Prescribed? Off BB due to CHB For EF 45% or less, Was ACEI/ARB Prescribed? Yes ADP Receptor Inhibitor Prescribed? (i.e.  Plavix etc.-Includes Medically Managed Patients): Yes For EF <40%, Aldosterone Inhibitor Prescribed? N/A Was EF assessed during THIS hospitalization? Yes Was Cardiac Rehab II ordered? (Included Medically managed Patients): Yes   Outstanding Labs/Studies   None  Duration of Discharge Encounter   Greater than 30 minutes including physician time.  Signed, Bhagat,Bhavinkumar PA-C 11/20/2016, 12:27 PM  Agree with note by Chelsea Aus PA-C  Okay for discharge. No chest pain, sinus rhythm, follow-up with Dr. Diona Browner in [place].  Runell Gess, M.D., FACP, [facility], [facility] [address] [place], [place]  [address]  [phone] 11/20/2016 2:17 PM

## 2016-11-20 NOTE — Telephone Encounter (Signed)
-----   Message from Dyane Dustmanerry L Goins sent at 11/20/2016 12:24 PM EDT ----- Regarding: TCM Patient to be d/c'd today / tg

## 2016-11-20 NOTE — Care Management Important Message (Signed)
Important Message  Patient Details  Name: Lauraine Rinneearly M Kurkowski MRN: [id number] Date of Birth: 04/09/1934   Medicare Important Message Given:  Yes    Kyla BalzarineShealy, Xzavien Harada [place] 11/20/2016, 12:38 PM

## 2016-11-20 NOTE — Progress Notes (Signed)
Progress Note  Patient Name: [name] Estill BambergM Perona Date of Encounter: 11/20/2016  Primary Cardiologist: Dr. Tresa EndoKelly  Subjective   Feeling well. No chest pain, sob or palpitations. Unsteady gait due to knee issue chronically. She goes to PT. HR increased with activity yesterday.   Inpatient Medications    Scheduled Meds: . amLODipine  5 mg Oral Daily  . aspirin  324 mg Oral Once  . aspirin EC  81 mg Oral Daily  . heparin  5,000 Units Subcutaneous Q8H  . hydrALAZINE  50 mg Oral TID  . losartan  50 mg Oral Daily  . pantoprazole  40 mg Oral Daily  . rosuvastatin  40 mg Oral q1800  . sodium chloride flush  3 mL Intravenous Q12H  . ticagrelor  90 mg Oral BID   Continuous Infusions:  PRN Meds: sodium chloride, acetaminophen, nitroGLYCERIN, ondansetron (ZOFRAN) IV, sodium chloride flush   Vital Signs    Vitals:   11/19/16 2208 11/20/16 0538 11/20/16 0540 11/20/16 0546  BP: 113/64 (!) 101/47 124/70   Pulse:  74    Resp: 16 18    Temp: 97.7 F (36.5 C) 98.9 F (37.2 C)    TempSrc: Axillary Oral    SpO2: 99% 100%    Weight:    181 lb 6.4 oz (82.3 kg)  Height:        Intake/Output Summary (Last 24 hours) at 11/20/16 0742 Last data filed at 11/19/16 2100  Gross per 24 hour  Intake              661 ml  Output                0 ml  Net              661 ml   Filed Weights   11/16/16 1200 11/16/16 1341 11/20/16 0546  Weight: 192 lb (87.1 kg) 184 lb 15.5 oz (83.9 kg) 181 lb 6.4 oz (82.3 kg)    Telemetry    NSR with rate mostly in 70s - Personally Reviewed  ECG    11/19/16 EKG showed sinus rhythm with TWI/ST depression in lateral leads which is new changes from 11/18/16  - Personally Reviewed  Physical Exam   GEN: No acute distress.   Neck: No JVD Cardiac: RRR, no murmurs, rubs, or gallops.  Respiratory: Clear to auscultation bilaterally. GI: Soft, nontender, non-distended  MS: No edema; No deformity. Neuro:  Nonfocal  Psych: Normal affect   Labs     Chemistry Recent Labs Lab 11/17/16 0530 11/18/16 0405 11/19/16 0156  NA 135 135 139  K 3.3* 3.4* 3.6  CL 105 105 108  CO2 20* 22 23  GLUCOSE 166* 124* 104*  BUN 18 24* 17  CREATININE 1.38* 1.45* 1.18*  CALCIUM 8.2* 8.3* 8.2*  GFRNONAA 35* 33* 42*  GFRAA 41* 38* 49*  ANIONGAP 10 8 8      Hematology Recent Labs Lab 11/17/16 0530 11/18/16 0405 11/19/16 0156  WBC 8.3 10.5 7.3  RBC 4.13 4.22 4.15  HGB 11.9* 12.1 11.7*  HCT 35.0* 35.6* 35.1*  MCV 84.7 84.4 84.6  MCH 28.8 28.7 28.2  MCHC 34.0 34.0 33.3  RDW 14.5 14.4 14.4  PLT 154 164 182    Cardiac Enzymes Recent Labs Lab 11/16/16 1416 11/16/16 1932 11/18/16 0405  TROPONINI 7.56* 7.86* 7.89*    Recent Labs Lab 11/16/16 1112  TROPIPOC 7.35*     BNPNo results for input(s): BNP, PROBNP in the last 168 hours.  DDimer No results for input(s): DDIMER in the last 168 hours.   Radiology    No results found.  Cardiac Studies   Coronary Stent Intervention  11/16/16  Left Heart Cath and Coronary Angiography  Temporary Pacemaker  Conclusion   1. Critical stenosis of the distal circumflex treated successfully with PCI using a 2.5 mm drug-eluting stent 2. Moderate mid LAD stenosis, does not appear to be flow-obstructive 3. Moderate proximal RCA stenosis (nondominant vessel) 4. Patent stents in the RCA and ramus intermedius 5. Normal LVEDP-will assess LV function by echo because of chronic kidney disease 6. Successful insertion of a temporary transvenous pacing wire for treatment of complete heart block  Dual antiplatelet therapy with aspirin and brilinta for at least one year, follow heart rhythm closely. EP service notified.    Echo 11/18/16 Study Conclusions  - Left ventricle: The cavity size was normal. Wall thickness was   increased in a pattern of severe LVH. Systolic function was   vigorous. The estimated ejection fraction was in the range of 65%   to 70%. Wall motion was normal; there were no  regional wall   motion abnormalities. Doppler parameters are consistent with   abnormal left ventricular relaxation (grade 1 diastolic   dysfunction). - Aortic valve: Mildly calcified annulus. Trileaflet; mildly   calcified leaflets. There was mild regurgitation. Mean gradient   (S): 8 mm Hg. Peak gradient (S): 17 mm Hg. VTI ratio of LVOT to   aortic valve: 0.71. - Mitral valve: Calcified annulus. There was mild regurgitation. - Right atrium: Central venous pressure (est): 3 mm Hg. - Atrial septum: No defect or patent foramen ovale was identified. - Tricuspid valve: There was trivial regurgitation. - Pulmonary arteries: PA peak pressure: 22 mm Hg (S). - Pericardium, extracardiac: A small pericardial effusion was   identified circumferential to the heart. Somewhat larger   collection anterior to the apical right ventricle. No obvious   hemodynamic compromise. With recent temporary transvenous   pacemaker in place, consider a follow-up study to reassess.  Impressions:  - Severe LVH with LVEF 65-70% and grade 1 diastolic dysfunction.   Mild mitral regurgitation. Sclerotic aortic valve with mild   aortic regurgitation. Trivial tricuspid regurgitation with PASP   22 mmHg. A small pericardial effusion was identified   circumferential to the heart. Somewhat larger collection anterior   to the apical right ventricle. No obvious hemodynamic compromise.   With recent temporary transvenous pacemaker in place, consider a   follow-up study to reassess.  Patient Profile       83 y.o.femalewith a history of hypertension, hyperlipidemia, OSA and CAD with prior stent placement to the ramus intermedius and RCA, now presenting with NSTEMI associated with complete heart block. Emergent cardiac catheterization on March 9 revealed critical stenosis within the distal circumflex was treated with DES, otherwise moderate residual disease and patent stent sites. Temporary transvenous pacing wire was placed  and EP service consulted. Ultimately, the temporary transvenous pacemaker was discontinued.  Assessment & Plan      1: Non-STEMI-history of prior stenting in the past. She presented 11/16/16 with non-STEMI and was taken to the Cath Lab that showed widely patent stents. She did have a 90% stenosis in the mid AV groove, and circumflex which was stented with a drug-eluting stent. She remains on dual antiplatelet therapy. Peak of troponin 7.89. Remained chest pain free.  Echo showed normal EF. EKG yesterday showed new ST changes in lateral leads. Will repeat EKG today. Continue ASA, Brilinta  and statin.   2: Complete heart block-the patient was initially in complete heart block which has resolved with vascularization. Her temporary transvenous pacemaker has been discontinued and she remains in sinus rhythm. Avoidance of beta blockers at this time.  3: Hyperlipidemia 11/17/2016: Cholesterol 218; HDL 37; LDL Cholesterol 163; Triglycerides 89; VLDL 18  - On home Crestor 40mg  qd. Consider adding Zetia/Repatha.   4. HTN - Intermittently soft low. Follow closely. Reduce dose as needed.    Dispo: unsteady gait due to knee issue. She goes to PT. Walk with cardiac rehab later today. If does well, likely discharge later today. She lives in [place] and likely followed up there with Dr. Diona Browner who saw her during this admission.   Signed, Manson Passey, PA  11/20/2016, 7:42 AM    Agree with note by Chelsea Aus PA-C  Postop day 4 inferolateral non-STEMI treated with circumflex drug-eluting stenting. Prior stents were demonstrated to be patent. The patient has normal LV function. She is on aspirin and Brilinta. She has normal sinus rhythm but did require temporary transvenous pacemaker initially for complete heart block. Exam is benign. The patient denies chest pain or shortness of breath. She is being admitted for cardiac rehabilitation and is somewhat unstable on her feet but not so much so that it  would preclude discharge. Her EKG yesterday did show lateral ST segment depression which will be repeated. If her EKG has improved she can be discharged home later today. She wishes to follow-up with Dr. Diona Browner in [place] because of proximity  Runell Gess, M.D., FACP, [facility], Kathryne Eriksson [facility] [address] [place], [place]  [address]  [phone] 11/20/2016 9:01 AM

## 2016-11-20 NOTE — Progress Notes (Signed)
Discharge instructions reviewed with pt. Pt has no questions at this time. Pt denies any pain.  Family at bedside. Groin level 0. Spoke with Vin pa, who stated he sent prescription for hydralazine [facility]. Information passed on to pt and family.

## 2016-11-20 NOTE — Care Management Note (Signed)
Case Management Note  Patient Details  Name: Lauraine Rinneearly M Aronoff MRN: [id number] Date of Birth: 07/02/1934  Subjective/Objective:  Pt presented for NStemi associated with CHB. Post emergent cardiac cath. Plan will be to d/c home on Brilinta. Benefits check completed and pt is aware of cost @ $3.00. CM did call [facility] [address] [facility] [place] and medication is available.  CM did provide pt with 30 day free card.               Action/Plan: Pt to follow up with Cardiac Rehab post d/c. Cardiac Rehab to contact patient. No further needs from CM at this time.   Expected Discharge Date:                  Expected Discharge Plan:  Home/Self Care  In-House Referral:  NA  Discharge planning Services  CM Consult, Medication Assistance  Post Acute Care Choice:  NA Choice offered to:  NA  DME Arranged:  N/A DME Agency:  NA  HH Arranged:  NA HH Agency:     Status of Service:  Completed, signed off  If discussed at Long Length of Stay Meetings, dates discussed:    Additional Comments:  Gala LewandowskyGraves-Bigelow, Lucinda Spells Kaye, RN 11/20/2016, 12:24 PM

## 2016-11-20 NOTE — Progress Notes (Signed)
CARDIAC REHAB PHASE I   PRE:  Rate/Rhythm: 80 first degree    BP: sitting 118/56    SaO2:   MODE:  Ambulation: 220 ft   POST:  Rate/Rhythm: 109 first degree with PACs    BP: sitting 137/72     SaO2: 99 RA  Pt feeling better today. Used RW, gait belt (light assist) with increased distance. Less knee pain and less SOB, even though HR about the same. Ed completed with pt and family. She needs Brilinta card, will notifiy CM. Discussed CRPII benefits and [facility]. She likes doing Flexogenics and prefers this. I see benefit in doing CRPII for atleast a few weeks to monitor rhythm, then resume Flexogenics. [id number]   [name] MassonRandi Kristan Mckynleigh Johnson CES, ACSM 11/20/2016 10:55 AM

## 2016-11-23 ENCOUNTER — Encounter: Payer: Self-pay | Admitting: Physician Assistant

## 2016-11-23 DIAGNOSIS — I3139 Other pericardial effusion (noninflammatory): Secondary | ICD-10-CM | POA: Insufficient documentation

## 2016-11-23 DIAGNOSIS — N183 Chronic kidney disease, stage 3 unspecified: Secondary | ICD-10-CM | POA: Insufficient documentation

## 2016-11-23 DIAGNOSIS — I313 Pericardial effusion (noninflammatory): Secondary | ICD-10-CM | POA: Insufficient documentation

## 2016-11-23 NOTE — Progress Notes (Addendum)
Cardiology Office Note    Date:  11/26/2016  ID:  [name], DOB 06/16/1934, MRN [id number] PCP:  Colette RibasGOLDING, JOHN [name], MD  Cardiologist:  Dr. Tresa EndoKelly (She lives in [place] and requests to f/u here with Dr. Diona BrownerMcDowell who saw her during this admission)   Chief Complaint: f/u MI  History of Present Illness:  [name] Estill BambergM Flanagin is a 83 y.o. female with history of hypertension, hyperlipidemia, OSA, reflux esophagitis, arthritis, obesity, and CAD (with prior stent placement to the ramus intermedius and RCA, NSTEMI 11/2016 s/p DES to DES to distal Cx), transient CHB, CKD III who presents for post-hospital f/u. She was admitted 11/2016 with NSTEMI associated with complete heart block, peak troponin 7.89. [facility]:  1. Critical stenosis of the distal circumflex treated successfully with PCI using a 2.5 mm drug-eluting stent 2. Moderate mid LAD stenosis, does not appear to be flow-obstructive 3. Moderate proximal RCA stenosis (nondominant vessel) 4. Patent stents in the RCA and ramus intermedius 5. Normal LVEDP-will assess LV function by echo because of chronic kidney disease 6. Successful insertion of a temporary transvenous pacing wire for treatment of complete heart block  CHB resolved with revascularization, discharged on ASA/Brilinta - rec to consider Zetia/Repatha as OP for LDL 163 (although the patient was off Crestor due to some sort of refill error). Other labs pertinent for Cr 1.18 (prior Cr 1.3-1.5 c/w CKD III), Hgb 11.7 c/w prior, last CMET 2016. 2D echo showed severe LVH, EF 65-70%, mild MR, small pericardial effusion, somewhat larger collection anterior to apical RV.  She returns for follow-up today overall doing well. No further chest pain or dyspnea, just trying to get her strength back. She and her daughter have many questions about her care over the last 2 years. I did my best to answer these questions as best as I could. She wishes to switch to Dr. Diona BrownerMcDowell because she lives here in  [place] and is consolidating her care locally. She does report some bruising over her left lower quadrant abdomen but no bruising from groin site itself. She thinks this is where she was getting heparin injections. She reports it's slowly improving.   Past Medical History:  Diagnosis Date  . Arthritis   . CKD (chronic kidney disease), stage III   . Complete heart block (HCC) 11/16/2016   a. transient during NSTEMI, resolved with revascularization.  . Coronary artery disease 11/2009   a. ith prior stent placement to the ramus intermedius and RCA. b. NSTEMI 11/2016 s/p DES to DES to distal Cx with moderate residual dz.  . High cholesterol   . Hypertension   . Non-ST elevation (NSTEMI) myocardial infarction (HCC) 11/16/2016  . Obesity   . Reflux esophagitis   . Sleep apnea 09/27/2010   Untreated, REM 64.7/hr AHI 18.5/hr RDI 19.2/hr. Patuent refused CPAP therapy    Past Surgical History:  Procedure Laterality Date  . CHOLECYSTECTOMY    . CORONARY ANGIOPLASTY WITH STENT PLACEMENT  2007   A 3.0x2218mm CYPHER stent post dilated to 3.29 mm the 100% occlusion was reduced to 0%  . CORONARY STENT INTERVENTION N/A 11/16/2016   Procedure: Coronary Stent Intervention;  Surgeon: Tonny BollmanMichael Cooper, MD;  [facility];  Service: Cardiovascular;  Laterality: N/A;  . LEFT HEART CATH AND CORONARY ANGIOGRAPHY N/A 11/16/2016   Procedure: Left Heart Cath and Coronary Angiography;  Surgeon: Tonny BollmanMichael Cooper, MD;  [facility];  Service: Cardiovascular;  Laterality: N/A;  . LEFT HEART CATHETERIZATION WITH CORONARY  ANGIOGRAM N/A 07/12/2014   Procedure: LEFT HEART CATHETERIZATION WITH CORONARY ANGIOGRAM;  Surgeon: Micheline Chapman, MD;  Location[facility] C[facility] LAB;  Service: Cardiovascular;  Laterality: N/A;  . TEMPORARY PACEMAKER N/A 11/16/2016   Procedure: Temporary Pacemaker;  Surgeon: Tonny Bollman, MD;  Location: [facility] INVASIVE CV LAB;  Service: Cardiovascular;  Laterality: N/A;    Current  Medications: Current Outpatient Prescriptions  Medication Sig Dispense Refill  . acetaminophen (TYLENOL) 325 MG tablet Take 325-650 mg by mouth every 6 (six) hours as needed (for headaches).    Marland Kitchen amLODipine (NORVASC) 5 MG tablet Take 5 mg by mouth daily.   3  . aspirin 81 MG tablet Take 81 mg by mouth daily.    . fluticasone (FLONASE) 50 MCG/ACT nasal spray Place 1 spray into both nostrils as needed for allergies or rhinitis.     . furosemide (LASIX) 40 MG tablet TAKE 1 TABLET BY MOUTH DAILY AS NEEDED FOR EDEMA 30 tablet 0  . hydrALAZINE (APRESOLINE) 100 MG tablet Take 0.5 tablets (50 mg total) by mouth 3 (three) times daily. 45 tablet 6  . Iron Succinyl-Protein Complex 40 MG/15ML SOLN Take 40 mg by mouth daily.    Marland Kitchen losartan (COZAAR) 50 MG tablet Take 1 tablet (50 mg total) by mouth daily. 30 tablet 5  . nitroGLYCERIN (NITROSTAT) 0.4 MG SL tablet PLACE 1 TABLET (0.4 MG TOTAL) UNDER THE TONGUE EVERY 5 (FIVE) MINUTES AS NEEDED. (Patient taking differently: Place 0.4 mg under the tongue every 5 (five) minutes as needed for chest pain. ) 25 tablet 0  . pantoprazole (PROTONIX) 40 MG tablet Take 1 tablet (40 mg total) by mouth daily. 30 tablet 6  . rosuvastatin (CRESTOR) 40 MG tablet Take 1 tablet (40 mg total) by mouth daily. 90 tablet 3  . ticagrelor (BRILINTA) 90 MG TABS tablet Take 1 tablet (90 mg total) by mouth 2 (two) times daily. 180 tablet 3  . Vitamin D, Ergocalciferol, (DRISDOL) 50000 units CAPS capsule Take 50,000 Units by mouth every 30 (thirty) days.  0   No current facility-administered medications for this visit.      Allergies:   Atorvastatin; Contrast media [iodinated diagnostic agents]; Darvon [propoxyphene]; and Codeine   Social History   Social History  . Marital status: Single    Spouse name: N/A  . Number of children: N/A  . Years of education: N/A   Social History Main Topics  . Smoking status: Never Smoker  . Smokeless tobacco: Never Used  . Alcohol use No  .  Drug use: No  . Sexual activity: Not Asked   Other Topics Concern  . None   Social History Narrative  . None     Family History:  Family History  Problem Relation Age of Onset  . Hypertension Other     ROS:   Please see the history of present illness.  All other systems are reviewed and otherwise negative.    PHYSICAL EXAM:   VS:  BP 132/66   Pulse 99   Ht 5\' 5"  (1.651 m)   Wt 179 lb (81.2 kg)   SpO2 98%   BMI 29.79 kg/m   BMI: Body mass index is 29.79 kg/m. GEN: Well nourished, well developed AAF, in no acute distress  HEENT: normocephalic, atraumatic Neck: no JVD, carotid bruits, or masses Cardiac: RRR - pulse 85 on auscultation; no murmurs, rubs, or gallops, no edema  Respiratory:  clear to auscultation bilaterally, normal work of breathing GI: soft, nontender, nondistended, + BS  MS: no deformity or atrophy  Skin: warm and dry, no rash. Moderate sized area of ecchymosis on RLQ with mild yellowing indicative of early improvement. Right groin cath site without hematoma, ecchymosis, or bruit. Neuro:  Alert and Oriented x 3, Strength and sensation are intact, follows commands Psych: euthymic mood, full affect  Wt Readings from Last 3 Encounters:  11/26/16 179 lb (81.2 kg)  11/20/16 181 lb 6.4 oz (82.3 kg)  01/27/16 200 lb 6.4 oz (90.9 kg)      Studies/Labs Reviewed:   EKG:  EKG was ordered today and personally reviewed by me and demonstrates NSR 99bpm, incomplete RBBB and LAFB, prior inferior and anterolateral MI, nonspecific St-T Changes  Recent Labs: 11/19/2016: BUN 17; Creatinine, Ser 1.18; Hemoglobin 11.7; Platelets 182; Potassium 3.6; Sodium 139   Lipid Panel    Component Value Date/Time   CHOL 218 (H) 11/17/2016 0530   CHOL 146 01/19/2015 1048   TRIG 89 11/17/2016 0530   HDL 37 (L) 11/17/2016 0530   HDL 48 01/19/2015 1048   CHOLHDL 5.9 11/17/2016 0530   VLDL 18 11/17/2016 0530   LDLCALC 163 (H) 11/17/2016 0530   LDLCALC 77 01/19/2015 1048     Additional studies/ records that were reviewed today include: Summarized above.    ASSESSMENT & PLAN:   1. CAD with recent NSTEMI - doing well, interested in cardiac rehab. Continue ASA, Brilinta, and resumption of statin. No more AVN blocking agents due to #5. Reviewed abdominal ecchymosis with Dr. Purvis Sheffield - small hematoma but no evidence to suggest retroperitoneal hematoma. This appears to be totally separate from her cath site. No groin hematoma or bruit. Continue observation for any progression. The patient feels it is improving slowly. She will notify us if there is any spread or pain. 2. HTN - controlled, contiue present regimen. 3. CKD stage III - stable by recent cath. 4. Hyperlipidemia - she was off Crestor before recent admission because she did not have any more fills. She has since restarted. Will recheck lipids and liver in 6 weeks. 5. Symptomatic bradycardia - resolved. Avoid AVN blocking agents. With RBBB and LAFB suspect underlying conduction disease that will need to be followed long-term. 6. Pericardial effusion - small by recent echo, follow clinically.  Disposition: F/u with Dr. Diona Browner in 3 months (will send message to Dr. Kelly/McDowell clarifying if OK to switch).   Medication Adjustments/Labs and Tests Ordered: Current medicines are reviewed at length with the patient today.  Concerns regarding medicines are outlined above. Medication changes, Labs and Tests ordered today are summarized above and listed in the Patient Instructions accessible in Encounters.   Thomasene Mohair PA-C  11/26/2016 2:37 PM    [facility] [place] Location in [facility] [address] [place], [place] [address] Ph: [phone]; Fax [phone]

## 2016-11-26 ENCOUNTER — Encounter: Payer: Self-pay | Admitting: Physician Assistant

## 2016-11-26 ENCOUNTER — Ambulatory Visit (INDEPENDENT_AMBULATORY_CARE_PROVIDER_SITE_OTHER): Payer: Medicare Other | Admitting: Physician Assistant

## 2016-11-26 ENCOUNTER — Encounter: Payer: Medicare Other | Admitting: Adult Health

## 2016-11-26 VITALS — BP 132/66 | HR 99 | Ht 65.0 in | Wt 179.0 lb

## 2016-11-26 DIAGNOSIS — N183 Chronic kidney disease, stage 3 unspecified: Secondary | ICD-10-CM

## 2016-11-26 DIAGNOSIS — I313 Pericardial effusion (noninflammatory): Secondary | ICD-10-CM

## 2016-11-26 DIAGNOSIS — I2510 Atherosclerotic heart disease of native coronary artery without angina pectoris: Secondary | ICD-10-CM

## 2016-11-26 DIAGNOSIS — Z9861 Coronary angioplasty status: Secondary | ICD-10-CM | POA: Diagnosis not present

## 2016-11-26 DIAGNOSIS — Z79899 Other long term (current) drug therapy: Secondary | ICD-10-CM | POA: Diagnosis not present

## 2016-11-26 DIAGNOSIS — I214 Non-ST elevation (NSTEMI) myocardial infarction: Secondary | ICD-10-CM | POA: Diagnosis not present

## 2016-11-26 DIAGNOSIS — I459 Conduction disorder, unspecified: Secondary | ICD-10-CM | POA: Diagnosis not present

## 2016-11-26 DIAGNOSIS — E785 Hyperlipidemia, unspecified: Secondary | ICD-10-CM | POA: Diagnosis not present

## 2016-11-26 DIAGNOSIS — Z6829 Body mass index (BMI) 29.0-29.9, adult: Secondary | ICD-10-CM | POA: Diagnosis not present

## 2016-11-26 DIAGNOSIS — R001 Bradycardia, unspecified: Secondary | ICD-10-CM

## 2016-11-26 DIAGNOSIS — I10 Essential (primary) hypertension: Secondary | ICD-10-CM

## 2016-11-26 DIAGNOSIS — I3139 Other pericardial effusion (noninflammatory): Secondary | ICD-10-CM

## 2016-11-26 MED ORDER — NITROGLYCERIN 0.4 MG SL SUBL: 0.4000 mg | SUBLINGUAL_TABLET | SUBLINGUAL | 3 refills | Status: DC | PRN

## 2016-11-26 NOTE — Patient Instructions (Signed)
Medication Instructions:  Your physician recommends that you continue on your current medications as directed. Please refer to the Current Medication list given to you today.   Labwork: Your physician recommends that you return for lab work in: 6 weeks  Lipids Lft's   Testing/Procedures: none  Follow-Up: Your physician recommends that you schedule a follow-up appointment in: 3 months    Any Other Special Instructions Will Be Listed Below (If Applicable).     If you need a refill on your cardiac medications before your next appointment, please call your pharmacy.

## 2016-12-04 ENCOUNTER — Telehealth: Payer: Self-pay | Admitting: Physician Assistant

## 2016-12-04 NOTE — Telephone Encounter (Signed)
Ok by me to switch

## 2016-12-04 NOTE — Telephone Encounter (Signed)
I saw this patient in follow-up last week. In the past she has followed with Dr. Tresa EndoKelly but she and her daughter are requesting to switch to Dr. Diona BrownerMcDowell as they live in [place] and don't want [place] [facility] [place] anymore - they are trying to consolidate care locally. I sent a staff message to both Dr. Diona BrownerMcDowell and Dr. Tresa EndoKelly to approve the switch. Dr. Diona BrownerMcDowell replied he is fine with this. Will resend as patient call to Dr. Tresa EndoKelly and his nurse to review if this is OK with them. She will be following up with Dr. Diona BrownerMcDowell in 3 months if this is approved. Dayna Dunn PA-C

## 2016-12-05 NOTE — Telephone Encounter (Signed)
Called patient. No answer. Left message to call back.  

## 2016-12-05 NOTE — Telephone Encounter (Signed)
Please let patient know switch has been approved from Dr. Tresa EndoKelly to Dr. Diona BrownerMcDowell.

## 2016-12-06 NOTE — Telephone Encounter (Signed)
Patient notified

## 2017-01-03 ENCOUNTER — Encounter (HOSPITAL_COMMUNITY): Payer: Self-pay

## 2017-01-03 ENCOUNTER — Encounter (HOSPITAL_COMMUNITY)
Admission: RE | Admit: 2017-01-03 | Discharge: 2017-01-03 | Disposition: A | Discharge status: Home / Self Care | Payer: Medicare Other | Source: Ambulatory Visit | Attending: Cardiology | Admitting: Cardiology

## 2017-01-03 VITALS — BP 120/56 | HR 84 | Ht 62.0 in | Wt 185.0 lb

## 2017-01-03 DIAGNOSIS — E669 Obesity, unspecified: Secondary | ICD-10-CM | POA: Insufficient documentation

## 2017-01-03 DIAGNOSIS — Z79899 Other long term (current) drug therapy: Secondary | ICD-10-CM | POA: Insufficient documentation

## 2017-01-03 DIAGNOSIS — N183 Chronic kidney disease, stage 3 (moderate): Secondary | ICD-10-CM | POA: Insufficient documentation

## 2017-01-03 DIAGNOSIS — M1990 Unspecified osteoarthritis, unspecified site: Secondary | ICD-10-CM | POA: Insufficient documentation

## 2017-01-03 DIAGNOSIS — I214 Non-ST elevation (NSTEMI) myocardial infarction: Secondary | ICD-10-CM

## 2017-01-03 DIAGNOSIS — Z7951 Long term (current) use of inhaled steroids: Secondary | ICD-10-CM | POA: Diagnosis not present

## 2017-01-03 DIAGNOSIS — I2510 Atherosclerotic heart disease of native coronary artery without angina pectoris: Secondary | ICD-10-CM | POA: Insufficient documentation

## 2017-01-03 DIAGNOSIS — Z6833 Body mass index (BMI) 33.0-33.9, adult: Secondary | ICD-10-CM | POA: Insufficient documentation

## 2017-01-03 DIAGNOSIS — I129 Hypertensive chronic kidney disease with stage 1 through stage 4 chronic kidney disease, or unspecified chronic kidney disease: Secondary | ICD-10-CM | POA: Diagnosis not present

## 2017-01-03 DIAGNOSIS — I252 Old myocardial infarction: Secondary | ICD-10-CM | POA: Diagnosis not present

## 2017-01-03 DIAGNOSIS — Z7902 Long term (current) use of antithrombotics/antiplatelets: Secondary | ICD-10-CM | POA: Insufficient documentation

## 2017-01-03 DIAGNOSIS — Z7982 Long term (current) use of aspirin: Secondary | ICD-10-CM | POA: Insufficient documentation

## 2017-01-03 DIAGNOSIS — Z955 Presence of coronary angioplasty implant and graft: Secondary | ICD-10-CM | POA: Insufficient documentation

## 2017-01-03 DIAGNOSIS — G4730 Sleep apnea, unspecified: Secondary | ICD-10-CM | POA: Insufficient documentation

## 2017-01-03 DIAGNOSIS — E7800 Pure hypercholesterolemia, unspecified: Secondary | ICD-10-CM | POA: Diagnosis not present

## 2017-01-03 NOTE — Progress Notes (Signed)
6 MIN NUSTEP TEST  Date: 01/03/2017 Weight: 83.8KG Height: 62     REST   6-MIN   POST 2-MIN HR   84   103   87 BP   120/56   164/64   134/60 O2   98   97   98 RPE   RPD   Distance: .33 miles/ 1719ft.  Ex METs: 1.9  Comments: 16 Watts. 3.45mph, Both knee pain 5/10 during test and afterward was 2/10.

## 2017-01-03 NOTE — Progress Notes (Signed)
Cardiac Individual Treatment Plan  Patient Details  Name: [name] MRN: [id number] Date of Birth: 1933-12-29 Referring Provider:     CARDIAC REHAB PHASE II ORIENTATION from 01/03/2017 in [facility] CARDIAC REHABILITATION  Referring Provider  Dr. Diona Browner      Initial Encounter Date:    CARDIAC REHAB PHASE II ORIENTATION from 01/03/2017 in [place] CARDIAC REHABILITATION  Date  01/03/17  Referring Provider  Dr. Diona Browner      Visit Diagnosis: NSTEMI (non-ST elevated myocardial infarction) [facility])  Status post coronary artery stent placement  Patient's Home Medications on Admission:  Current Outpatient Prescriptions:  .  acetaminophen (TYLENOL) 325 MG tablet, Take 325-650 mg by mouth every 6 (six) hours as needed (for headaches)., Disp: , Rfl:  .  amLODipine (NORVASC) 5 MG tablet, Take 5 mg by mouth daily. , Disp: , Rfl: 3 .  aspirin 81 MG tablet, Take 81 mg by mouth daily., Disp: , Rfl:  .  fluticasone (FLONASE) 50 MCG/ACT nasal spray, Place 1 spray into both nostrils as needed for allergies or rhinitis. , Disp: , Rfl:  .  furosemide (LASIX) 40 MG tablet, TAKE 1 TABLET BY MOUTH DAILY AS NEEDED FOR EDEMA, Disp: 30 tablet, Rfl: 0 .  hydrALAZINE (APRESOLINE) 100 MG tablet, Take 0.5 tablets (50 mg total) by mouth 3 (three) times daily., Disp: 45 tablet, Rfl: 6 .  Iron Succinyl-Protein Complex 40 MG/15ML SOLN, Take 40 mg by mouth daily., Disp: , Rfl:  .  losartan (COZAAR) 50 MG tablet, Take 1 tablet (50 mg total) by mouth daily., Disp: 30 tablet, Rfl: 5 .  nitroGLYCERIN (NITROSTAT) 0.4 MG SL tablet, Place 1 tablet (0.4 mg total) under the tongue every 5 (five) minutes as needed., Disp: 25 tablet, Rfl: 3 .  pantoprazole (PROTONIX) 40 MG tablet, Take 1 tablet (40 mg total) by mouth daily., Disp: 30 tablet, Rfl: 6 .  rosuvastatin (CRESTOR) 40 MG tablet, Take 1 tablet (40 mg total) by mouth daily., Disp: 90 tablet, Rfl: 3 .  ticagrelor (BRILINTA) 90 MG TABS tablet, Take 1 tablet (90 mg  total) by mouth 2 (two) times daily., Disp: 180 tablet, Rfl: 3 .  Vitamin D, Ergocalciferol, (DRISDOL) 50000 units CAPS capsule, Take 50,000 Units by mouth every 30 (thirty) days., Disp: , Rfl: 0  Past Medical History: Past Medical History:  Diagnosis Date  . Arthritis   . CKD (chronic kidney disease), stage III   . Complete heart block (HCC) 11/16/2016   a. transient during NSTEMI, resolved with revascularization.  . Coronary artery disease 11/2009   a. with prior stent placement to the ramus intermedius and RCA. b. NSTEMI 11/2016 s/p DES to DES to distal Cx with moderate residual dz.  . High cholesterol   . Hypertension   . Non-ST elevation (NSTEMI) myocardial infarction (HCC) 11/16/2016  . Obesity   . Reflux esophagitis   . Sleep apnea 09/27/2010   Untreated, REM 64.7/hr AHI 18.5/hr RDI 19.2/hr. Patuent refused CPAP therapy    Tobacco Use: History  Smoking Status  . Never Smoker  Smokeless Tobacco  . Never Used    Labs: Recent Review Flowsheet Data    Labs for ITP Cardiac and Pulmonary Rehab Latest Ref Rng & Units 03/02/2014 11/16/2014 01/19/2015 10/20/2015 11/17/2016   Cholestrol 0 - 200 mg/dL 782 956(O) 130 - 865(H)   LDLCALC 0 - 99 mg/dL 98 846(N) 77 - 629(B)   HDL >40 mg/dL 56 44 48 - 28(U)   Trlycerides <150 mg/dL 87 132(G) 401 -  89   Hemoglobin A1c <5.7 % 6.0(H) - - - -   TCO2 0 - 100 mmol/L - - - 23 -      Capillary Blood Glucose: Lab Results  Component Value Date   GLUCAP 163 (H) 07/13/2014   GLUCAP 110 (H) 07/12/2014   GLUCAP 110 (H) 07/11/2014     Exercise Target Goals: Date: 01/03/17  Exercise Program Goal: Individual exercise prescription set with THRR, safety & activity barriers. Participant demonstrates ability to understand and report RPE using BORG scale, to self-measure pulse accurately, and to acknowledge the importance of the exercise prescription.  Exercise Prescription Goal: Starting with aerobic activity 30 plus minutes a day, 3 days per week  for initial exercise prescription. Provide home exercise prescription and guidelines that participant acknowledges understanding prior to discharge.  Activity Barriers & Risk Stratification:   6 Minute Walk:   Oxygen Initial Assessment:   Oxygen Re-Evaluation:   Oxygen Discharge (Final Oxygen Re-Evaluation):   Initial Exercise Prescription:     Initial Exercise Prescription - 01/03/17 1100      Date of Initial Exercise RX and Referring Provider   Date 01/03/17   Referring Provider Dr. Diona Browner     NuStep   Level 2   SPM 10   Minutes 15   METs 1.8     Arm Ergometer   Level 1.5   Watts 11   RPM 11   Minutes 20   METs 1.8     Prescription Details   Frequency (times per week) 3   Duration Progress to 30 minutes of continuous aerobic without signs/symptoms of physical distress     Intensity   THRR 40-80% of Max Heartrate 106-118-129   Ratings of Perceived Exertion 11-13   Perceived Dyspnea 0-4     Progression   Progression Continue progressive overload as per policy without signs/symptoms or physical distress.     Resistance Training   Training Prescription Yes   Weight 1   Reps 10-15      Perform Capillary Blood Glucose checks as needed.  Exercise Prescription Changes:   Exercise Comments:   Exercise Goals and Review:      Exercise Goals    Row Name 01/03/17 1224             Exercise Goals   Intervention Provide advice, education, support and counseling about physical activity/exercise needs.;Develop an individualized exercise prescription for aerobic and resistive training based on initial evaluation findings, risk stratification, comorbidities and participant's personal goals.       Expected Outcomes Achievement of increased cardiorespiratory fitness and enhanced flexibility, muscular endurance and strength shown through measurements of functional capacity and personal statement of participant.       Increase Strength and Stamina Yes        Intervention Provide advice, education, support and counseling about physical activity/exercise needs.;Develop an individualized exercise prescription for aerobic and resistive training based on initial evaluation findings, risk stratification, comorbidities and participant's personal goals.       Expected Outcomes Achievement of increased cardiorespiratory fitness and enhanced flexibility, muscular endurance and strength shown through measurements of functional capacity and personal statement of participant.          Exercise Goals Re-Evaluation :    Discharge Exercise Prescription (Final Exercise Prescription Changes):   Nutrition:  Target Goals: Understanding of nutrition guidelines, daily intake of sodium 1500mg , cholesterol 200mg , calories 30% from fat and 7% or less from saturated fats, daily to have 5 or more servings of  fruits and vegetables.  Biometrics:     Pre Biometrics - 01/03/17 1157      Pre Biometrics   Height 5\' 2"  (1.575 m)   Waist Circumference 42 inches   Hip Circumference 42 inches   Waist to Hip Ratio 1 %   BMI (Calculated) 33.9   Triceps Skinfold 14 mm   % Body Fat 43.5 %   Grip Strength --  Machine Broken   Flexibility 0 in   Single Leg Stand 0 seconds       Nutrition Therapy Plan and Nutrition Goals:   Nutrition Discharge: Rate Your Plate Scores:   Nutrition Goals Re-Evaluation:   Nutrition Goals Discharge (Final Nutrition Goals Re-Evaluation):   Psychosocial: Target Goals: Acknowledge presence or absence of significant depression and/or stress, maximize coping skills, provide positive support system. Participant is able to verbalize types and ability to use techniques and skills needed for reducing stress and depression.  Initial Review & Psychosocial Screening:     Initial Psych Review & Screening - 01/03/17 1228      Initial Review   Current issues with None Identified     Family Dynamics   Good Support System? Yes      Barriers   Psychosocial barriers to participate in program There are no identifiable barriers or psychosocial needs.  QOL score 24.89     Screening Interventions   Interventions Encouraged to exercise      Quality of Life Scores:     Quality of Life - 01/03/17 1158      Quality of Life Scores   Health/Function Pre 21.93 %   Socioeconomic Pre 24.17 %   Psych/Spiritual Pre 30 %   Family Pre 27.5 %   GLOBAL Pre 24.89 %      PHQ-9: Recent Review Flowsheet Data    Depression screen [facility] 2/9 01/03/2017   Decreased Interest 0   Down, Depressed, Hopeless 0   PHQ - 2 Score 0   Altered sleeping 0   Tired, decreased energy 1   Change in appetite 1   Feeling bad or failure about yourself  0   Trouble concentrating 0   Moving slowly or fidgety/restless 0   Suicidal thoughts 0   PHQ-9 Score 2   Difficult doing work/chores Not difficult at all     Interpretation of Total Score  Total Score Depression Severity:  1-4 = Minimal depression, 5-9 = Mild depression, 10-14 = Moderate depression, 15-19 = Moderately severe depression, 20-27 = Severe depression   Psychosocial Evaluation and Intervention:     Psychosocial Evaluation - 01/03/17 1228      Psychosocial Evaluation & Interventions   Interventions Encouraged to exercise with the program and follow exercise prescription   Continue Psychosocial Services  No Follow up required      Psychosocial Re-Evaluation:   Psychosocial Discharge (Final Psychosocial Re-Evaluation):   Vocational Rehabilitation: Provide vocational rehab assistance to qualifying candidates.   Vocational Rehab Evaluation & Intervention:     Vocational Rehab - 01/03/17 1220      Initial Vocational Rehab Evaluation & Intervention   Assessment shows need for Vocational Rehabilitation No      Education: Education Goals: Education classes will be provided on a weekly basis, covering required topics. Participant will state understanding/return  demonstration of topics presented.  Learning Barriers/Preferences:     Learning Barriers/Preferences - 01/03/17 1220      Learning Barriers/Preferences   Learning Barriers None   Learning Preferences Pictoral;Video  Education Topics: Hypertension, Hypertension Reduction -Define heart disease and high blood pressure. Discus how high blood pressure affects the body and ways to reduce high blood pressure.   Exercise and Your Heart -Discuss why it is important to exercise, the FITT principles of exercise, normal and abnormal responses to exercise, and how to exercise safely.   Angina -Discuss definition of angina, causes of angina, treatment of angina, and how to decrease risk of having angina.   Cardiac Medications -Review what the following cardiac medications are used for, how they affect the body, and side effects that may occur when taking the medications.  Medications include Aspirin, Beta blockers, calcium channel blockers, ACE Inhibitors, angiotensin receptor blockers, diuretics, digoxin, and antihyperlipidemics.   Congestive Heart Failure -Discuss the definition of CHF, how to live with CHF, the signs and symptoms of CHF, and how keep track of weight and sodium intake.   Heart Disease and Intimacy -Discus the effect sexual activity has on the heart, how changes occur during intimacy as we age, and safety during sexual activity.   Smoking Cessation / COPD -Discuss different methods to quit smoking, the health benefits of quitting smoking, and the definition of COPD.   Nutrition I: Fats -Discuss the types of cholesterol, what cholesterol does to the heart, and how cholesterol levels can be controlled.   Nutrition II: Labels -Discuss the different components of food labels and how to read food label   Heart Parts and Heart Disease -Discuss the anatomy of the heart, the pathway of blood circulation through the heart, and these are affected by heart  disease.   Stress I: Signs and Symptoms -Discuss the causes of stress, how stress may lead to anxiety and depression, and ways to limit stress.   Stress II: Relaxation -Discuss different types of relaxation techniques to limit stress.   Warning Signs of Stroke / TIA -Discuss definition of a stroke, what the signs and symptoms are of a stroke, and how to identify when someone is having stroke.   Knowledge Questionnaire Score:     Knowledge Questionnaire Score - 01/03/17 1220      Knowledge Questionnaire Score   Pre Score 16/24      Core Components/Risk Factors/Patient Goals at Admission:     Personal Goals and Risk Factors at Admission - 01/03/17 1225      Core Components/Risk Factors/Patient Goals on Admission    Weight Management Weight Maintenance   Lipids Yes   Intervention Provide education and support for participant on nutrition & aerobic/resistive exercise along with prescribed medications to achieve LDL 70mg , HDL >40mg .   Expected Outcomes Short Term: Participant states understanding of desired cholesterol values and is compliant with medications prescribed. Participant is following exercise prescription and nutrition guidelines.;Long Term: Cholesterol controlled with medications as prescribed, with individualized exercise RX and with personalized nutrition plan. Value goals: LDL < 70mg , HDL > 40 mg.   Personal Goal Other Yes   Personal Goal Become stronger, Do my activities and not give out of breath   Intervention Attend CR 3 x week and supplement exercise at home 2 x week.    Expected Outcomes Reach personal goals.       Core Components/Risk Factors/Patient Goals Review:      Goals and Risk Factor Review    Row Name 01/03/17 1227             Core Components/Risk Factors/Patient Goals Review   Personal Goals Review Weight Management/Obesity;Improve shortness of breath with ADL's  Core Components/Risk Factors/Patient Goals at Discharge (Final  Review):      Goals and Risk Factor Review - 01/03/17 1227      Core Components/Risk Factors/Patient Goals Review   Personal Goals Review Weight Management/Obesity;Improve shortness of breath with ADL's      ITP Comments:     ITP Comments    Row Name 01/03/17 1622           ITP Comments Patient new to program. Plans to start Monday 01/07/17.          Comments: ITP 30 Day REVIEW Patient new to program. Plans to start Monday 01/07/17.

## 2017-01-03 NOTE — Progress Notes (Signed)
Cardiac Individual Treatment Plan  Patient Details  Name: [name] MRN: [id number] Date of Birth: Sep 03, 1934 Referring Provider:     CARDIAC REHAB PHASE II ORIENTATION from 01/03/2017 in [facility] CARDIAC REHABILITATION  Referring Provider  Dr. Diona Browner      Initial Encounter Date:    CARDIAC REHAB PHASE II ORIENTATION from 01/03/2017 in [place] CARDIAC REHABILITATION  Date  01/03/17  Referring Provider  Dr. Diona Browner      Visit Diagnosis: NSTEMI (non-ST elevated myocardial infarction) [facility])  Status post coronary artery stent placement  Patient's Home Medications on Admission:  Current Outpatient Prescriptions:  .  acetaminophen (TYLENOL) 325 MG tablet, Take 325-650 mg by mouth every 6 (six) hours as needed (for headaches)., Disp: , Rfl:  .  amLODipine (NORVASC) 5 MG tablet, Take 5 mg by mouth daily. , Disp: , Rfl: 3 .  aspirin 81 MG tablet, Take 81 mg by mouth daily., Disp: , Rfl:  .  fluticasone (FLONASE) 50 MCG/ACT nasal spray, Place 1 spray into both nostrils as needed for allergies or rhinitis. , Disp: , Rfl:  .  furosemide (LASIX) 40 MG tablet, TAKE 1 TABLET BY MOUTH DAILY AS NEEDED FOR EDEMA, Disp: 30 tablet, Rfl: 0 .  hydrALAZINE (APRESOLINE) 100 MG tablet, Take 0.5 tablets (50 mg total) by mouth 3 (three) times daily., Disp: 45 tablet, Rfl: 6 .  Iron Succinyl-Protein Complex 40 MG/15ML SOLN, Take 40 mg by mouth daily., Disp: , Rfl:  .  losartan (COZAAR) 50 MG tablet, Take 1 tablet (50 mg total) by mouth daily., Disp: 30 tablet, Rfl: 5 .  nitroGLYCERIN (NITROSTAT) 0.4 MG SL tablet, Place 1 tablet (0.4 mg total) under the tongue every 5 (five) minutes as needed., Disp: 25 tablet, Rfl: 3 .  pantoprazole (PROTONIX) 40 MG tablet, Take 1 tablet (40 mg total) by mouth daily., Disp: 30 tablet, Rfl: 6 .  rosuvastatin (CRESTOR) 40 MG tablet, Take 1 tablet (40 mg total) by mouth daily., Disp: 90 tablet, Rfl: 3 .  ticagrelor (BRILINTA) 90 MG TABS tablet, Take 1 tablet (90 mg  total) by mouth 2 (two) times daily., Disp: 180 tablet, Rfl: 3 .  Vitamin D, Ergocalciferol, (DRISDOL) 50000 units CAPS capsule, Take 50,000 Units by mouth every 30 (thirty) days., Disp: , Rfl: 0  Past Medical History: Past Medical History:  Diagnosis Date  . Arthritis   . CKD (chronic kidney disease), stage III   . Complete heart block (HCC) 11/16/2016   a. transient during NSTEMI, resolved with revascularization.  . Coronary artery disease 11/2009   a. with prior stent placement to the ramus intermedius and RCA. b. NSTEMI 11/2016 s/p DES to DES to distal Cx with moderate residual dz.  . High cholesterol   . Hypertension   . Non-ST elevation (NSTEMI) myocardial infarction (HCC) 11/16/2016  . Obesity   . Reflux esophagitis   . Sleep apnea 09/27/2010   Untreated, REM 64.7/hr AHI 18.5/hr RDI 19.2/hr. Patuent refused CPAP therapy    Tobacco Use: History  Smoking Status  . Never Smoker  Smokeless Tobacco  . Never Used    Labs: Recent Review Flowsheet Data    Labs for ITP Cardiac and Pulmonary Rehab Latest Ref Rng & Units 03/02/2014 11/16/2014 01/19/2015 10/20/2015 11/17/2016   Cholestrol 0 - 200 mg/dL 409 811(B) 147 - 829(F)   LDLCALC 0 - 99 mg/dL 98 621(H) 77 - 086(V)   HDL >40 mg/dL 56 44 48 - 78(I)   Trlycerides <150 mg/dL 87 696(E) 952 -  89   Hemoglobin A1c <5.7 % 6.0(H) - - - -   TCO2 0 - 100 mmol/L - - - 23 -      Capillary Blood Glucose: Lab Results  Component Value Date   GLUCAP 163 (H) 07/13/2014   GLUCAP 110 (H) 07/12/2014   GLUCAP 110 (H) 07/11/2014     Exercise Target Goals: Date: 01/03/17  Exercise Program Goal: Individual exercise prescription set with THRR, safety & activity barriers. Participant demonstrates ability to understand and report RPE using BORG scale, to self-measure pulse accurately, and to acknowledge the importance of the exercise prescription.  Exercise Prescription Goal: Starting with aerobic activity 30 plus minutes a day, 3 days per week  for initial exercise prescription. Provide home exercise prescription and guidelines that participant acknowledges understanding prior to discharge.  Activity Barriers & Risk Stratification:   6 Minute Walk: 6 MIN NUSTEP TEST  Date: 01/03/2017 Weight: 83.8KG Height: 62                                      REST                           6-MIN                          POST 2-MIN HR                               84                                103                              87 BP                               120/56                         164/64                         134/60 O2                               98                                97                                98 RPE                             6                                  9  6 RPD                             6                                  13                                6   Distance: .33 miles/ 1774ft.  Ex METs: 1.9  Comments: 16 Watts. 3.53mph, Both knee pain 5/10 during test and afterward was 2/10.6 MIN NUSTEP TEST   Oxygen Initial Assessment:   Oxygen Re-Evaluation:   Oxygen Discharge (Final Oxygen Re-Evaluation):   Initial Exercise Prescription:     Initial Exercise Prescription - 01/03/17 1100      Date of Initial Exercise RX and Referring Provider   Date 01/03/17   Referring Provider Dr. Diona Browner     NuStep   Level 2   SPM 10   Minutes 15   METs 1.8     Arm Ergometer   Level 1.5   Watts 11   RPM 11   Minutes 20   METs 1.8     Prescription Details   Frequency (times per week) 3   Duration Progress to 30 minutes of continuous aerobic without signs/symptoms of physical distress     Intensity   THRR 40-80% of Max Heartrate 106-118-129   Ratings of Perceived Exertion 11-13   Perceived Dyspnea 0-4     Progression   Progression Continue progressive overload as per policy without signs/symptoms or physical distress.     Resistance  Training   Training Prescription Yes   Weight 1   Reps 10-15      Perform Capillary Blood Glucose checks as needed.  Exercise Prescription Changes:   Exercise Comments:   Exercise Goals and Review:      Exercise Goals    Row Name 01/03/17 1224             Exercise Goals   Intervention Provide advice, education, support and counseling about physical activity/exercise needs.;Develop an individualized exercise prescription for aerobic and resistive training based on initial evaluation findings, risk stratification, comorbidities and participant's personal goals.       Expected Outcomes Achievement of increased cardiorespiratory fitness and enhanced flexibility, muscular endurance and strength shown through measurements of functional capacity and personal statement of participant.       Increase Strength and Stamina Yes       Intervention Provide advice, education, support and counseling about physical activity/exercise needs.;Develop an individualized exercise prescription for aerobic and resistive training based on initial evaluation findings, risk stratification, comorbidities and participant's personal goals.       Expected Outcomes Achievement of increased cardiorespiratory fitness and enhanced flexibility, muscular endurance and strength shown through measurements of functional capacity and personal statement of participant.          Exercise Goals Re-Evaluation :    Discharge Exercise Prescription (Final Exercise Prescription Changes):   Nutrition:  Target Goals: Understanding of nutrition guidelines, daily intake of sodium 1500mg , cholesterol 200mg , calories 30% from fat and 7% or less from saturated fats, daily to have 5 or more servings of fruits and vegetables.  Biometrics:     Pre Biometrics - 01/03/17 1157      Pre Biometrics   Height 5'  2" (1.575 m)   Waist Circumference 42 inches   Hip Circumference 42 inches   Waist to Hip Ratio 1 %   BMI  (Calculated) 33.9   Triceps Skinfold 14 mm   % Body Fat 43.5 %   Grip Strength --  Machine Broken   Flexibility 0 in   Single Leg Stand 0 seconds       Nutrition Therapy Plan and Nutrition Goals:   Nutrition Discharge: Rate Your Plate Scores:   Nutrition Goals Re-Evaluation:   Nutrition Goals Discharge (Final Nutrition Goals Re-Evaluation):   Psychosocial: Target Goals: Acknowledge presence or absence of significant depression and/or stress, maximize coping skills, provide positive support system. Participant is able to verbalize types and ability to use techniques and skills needed for reducing stress and depression.  Initial Review & Psychosocial Screening:     Initial Psych Review & Screening - 01/03/17 1228      Initial Review   Current issues with None Identified     Family Dynamics   Good Support System? Yes     Barriers   Psychosocial barriers to participate in program There are no identifiable barriers or psychosocial needs.  QOL score 24.89     Screening Interventions   Interventions Encouraged to exercise      Quality of Life Scores:     Quality of Life - 01/03/17 1158      Quality of Life Scores   Health/Function Pre 21.93 %   Socioeconomic Pre 24.17 %   Psych/Spiritual Pre 30 %   Family Pre 27.5 %   GLOBAL Pre 24.89 %      PHQ-9: Recent Review Flowsheet Data    Depression screen [facility] 2/9 01/03/2017   Decreased Interest 0   Down, Depressed, Hopeless 0   PHQ - 2 Score 0   Altered sleeping 0   Tired, decreased energy 1   Change in appetite 1   Feeling bad or failure about yourself  0   Trouble concentrating 0   Moving slowly or fidgety/restless 0   Suicidal thoughts 0   PHQ-9 Score 2   Difficult doing work/chores Not difficult at all     Interpretation of Total Score  Total Score Depression Severity:  1-4 = Minimal depression, 5-9 = Mild depression, 10-14 = Moderate depression, 15-19 = Moderately severe depression, 20-27 = Severe  depression   Psychosocial Evaluation and Intervention:     Psychosocial Evaluation - 01/03/17 1228      Psychosocial Evaluation & Interventions   Interventions Encouraged to exercise with the program and follow exercise prescription   Continue Psychosocial Services  No Follow up required      Psychosocial Re-Evaluation:   Psychosocial Discharge (Final Psychosocial Re-Evaluation):   Vocational Rehabilitation: Provide vocational rehab assistance to qualifying candidates.   Vocational Rehab Evaluation & Intervention:     Vocational Rehab - 01/03/17 1220      Initial Vocational Rehab Evaluation & Intervention   Assessment shows need for Vocational Rehabilitation No      Education: Education Goals: Education classes will be provided on a weekly basis, covering required topics. Participant will state understanding/return demonstration of topics presented.  Learning Barriers/Preferences:     Learning Barriers/Preferences - 01/03/17 1220      Learning Barriers/Preferences   Learning Barriers None   Learning Preferences Pictoral;Video      Education Topics: Hypertension, Hypertension Reduction -Define heart disease and high blood pressure. Discus how high blood pressure affects the body and ways to reduce high  blood pressure.   Exercise and Your Heart -Discuss why it is important to exercise, the FITT principles of exercise, normal and abnormal responses to exercise, and how to exercise safely.   Angina -Discuss definition of angina, causes of angina, treatment of angina, and how to decrease risk of having angina.   Cardiac Medications -Review what the following cardiac medications are used for, how they affect the body, and side effects that may occur when taking the medications.  Medications include Aspirin, Beta blockers, calcium channel blockers, ACE Inhibitors, angiotensin receptor blockers, diuretics, digoxin, and antihyperlipidemics.   Congestive Heart  Failure -Discuss the definition of CHF, how to live with CHF, the signs and symptoms of CHF, and how keep track of weight and sodium intake.   Heart Disease and Intimacy -Discus the effect sexual activity has on the heart, how changes occur during intimacy as we age, and safety during sexual activity.   Smoking Cessation / COPD -Discuss different methods to quit smoking, the health benefits of quitting smoking, and the definition of COPD.   Nutrition I: Fats -Discuss the types of cholesterol, what cholesterol does to the heart, and how cholesterol levels can be controlled.   Nutrition II: Labels -Discuss the different components of food labels and how to read food label   Heart Parts and Heart Disease -Discuss the anatomy of the heart, the pathway of blood circulation through the heart, and these are affected by heart disease.   Stress I: Signs and Symptoms -Discuss the causes of stress, how stress may lead to anxiety and depression, and ways to limit stress.   Stress II: Relaxation -Discuss different types of relaxation techniques to limit stress.   Warning Signs of Stroke / TIA -Discuss definition of a stroke, what the signs and symptoms are of a stroke, and how to identify when someone is having stroke.   Knowledge Questionnaire Score:     Knowledge Questionnaire Score - 01/03/17 1220      Knowledge Questionnaire Score   Pre Score 16/24      Core Components/Risk Factors/Patient Goals at Admission:     Personal Goals and Risk Factors at Admission - 01/03/17 1225      Core Components/Risk Factors/Patient Goals on Admission    Weight Management Weight Maintenance   Lipids Yes   Intervention Provide education and support for participant on nutrition & aerobic/resistive exercise along with prescribed medications to achieve LDL 70mg , HDL >40mg .   Expected Outcomes Short Term: Participant states understanding of desired cholesterol values and is compliant with  medications prescribed. Participant is following exercise prescription and nutrition guidelines.;Long Term: Cholesterol controlled with medications as prescribed, with individualized exercise RX and with personalized nutrition plan. Value goals: LDL < 70mg , HDL > 40 mg.   Personal Goal Other Yes   Personal Goal Become stronger, Do my activities and not give out of breath   Intervention Attend CR 3 x week and supplement exercise at home 2 x week.    Expected Outcomes Reach personal goals.       Core Components/Risk Factors/Patient Goals Review:      Goals and Risk Factor Review    Row Name 01/03/17 1227             Core Components/Risk Factors/Patient Goals Review   Personal Goals Review Weight Management/Obesity;Improve shortness of breath with ADL's          Core Components/Risk Factors/Patient Goals at Discharge (Final Review):      Goals and Risk Factor Review -  01/03/17 1227      Core Components/Risk Factors/Patient Goals Review   Personal Goals Review Weight Management/Obesity;Improve shortness of breath with ADL's      ITP Comments:   Comments: Patient arrived for 1st visit/orientation/education at 10:00. Patient was referred to CR by Dr.McDowell due to NSTEMI (I21.4) and Stent Placement (Z95.5). During orientation advised patient on arrival and appointment times what to wear, what to do before, during and after exercise. Reviewed attendance and class policy. Talked about inclement weather and class consultation policy. Pt is scheduled to return Cardiac Rehab on 01/07/17 at 1545. Pt was advised to come to class 15 minutes before class starts. Patient was also given instructions on meeting with the dietician and attending the Family Structure classes. Pt is eager to get started. Patient participated in warm stretches followed by light weights and resistance bands. Patient was then able to tolerate 6 minute Nustep test for assessment and functional fitness. Nustep test was  administered d/t severe osteoarthritis in knees bilaterally. Patient has chronic pain bilaterally 5/10 during test and 2/10 at rest. Pain completley subsided before leaving CR. Patient was measured for the equipment. Discussed equipment safety with patient. Took patient pre-anthropometric measurements. Patient finished visit at 12:00.

## 2017-01-07 ENCOUNTER — Encounter (HOSPITAL_COMMUNITY)
Admission: RE | Admit: 2017-01-07 | Discharge: 2017-01-07 | Disposition: A | Discharge status: Home / Self Care | Payer: Medicare Other | Source: Ambulatory Visit | Attending: Cardiology | Admitting: Cardiology

## 2017-01-07 DIAGNOSIS — I252 Old myocardial infarction: Secondary | ICD-10-CM | POA: Diagnosis not present

## 2017-01-07 DIAGNOSIS — I129 Hypertensive chronic kidney disease with stage 1 through stage 4 chronic kidney disease, or unspecified chronic kidney disease: Secondary | ICD-10-CM | POA: Diagnosis not present

## 2017-01-07 DIAGNOSIS — I214 Non-ST elevation (NSTEMI) myocardial infarction: Secondary | ICD-10-CM

## 2017-01-07 DIAGNOSIS — I2510 Atherosclerotic heart disease of native coronary artery without angina pectoris: Secondary | ICD-10-CM | POA: Diagnosis not present

## 2017-01-07 DIAGNOSIS — E669 Obesity, unspecified: Secondary | ICD-10-CM | POA: Diagnosis not present

## 2017-01-07 DIAGNOSIS — Z79899 Other long term (current) drug therapy: Secondary | ICD-10-CM | POA: Diagnosis not present

## 2017-01-07 DIAGNOSIS — Z955 Presence of coronary angioplasty implant and graft: Secondary | ICD-10-CM | POA: Diagnosis not present

## 2017-01-07 NOTE — Progress Notes (Signed)
Daily Session Note  Patient Details  Name: [name] MRN: [id number] Date of Birth: May 28, 1934 Referring Provider:     CARDIAC REHAB PHASE II ORIENTATION from 01/03/2017 in [place]  Referring Provider  Dr. Domenic Polite      Encounter Date: 01/07/2017  Check In:     Session Check In - 01/07/17 1545      Check-In   Location [facility]-Cardiac & Pulmonary Rehab   Staff Present Aundra Dubin, RN, BSN;Diane Coad, MS, EP, [facility], Exercise Physiologist   Supervising physician immediately available to respond to emergencies See telemetry face sheet for immediately available MD   Medication changes reported     No   Fall or balance concerns reported    No   Warm-up and Cool-down Performed as group-led instruction   Resistance Training Performed Yes   VAD Patient? No     Pain Assessment   Currently in Pain? No/denies   Pain Score 0-No pain   Multiple Pain Sites No      Capillary Blood Glucose: No results found for this or any previous visit (from the past 24 hour(s)).    History  Smoking Status  . Never Smoker  Smokeless Tobacco  . Never Used    Goals Met:  Independence with exercise equipment Exercise tolerated well No report of cardiac concerns or symptoms Strength training completed today  Goals Unmet:  Not Applicable  Comments: Check out 1645.   Dr. Kate Sable is Medical Director for [facility] Cardiac and Pulmonary Rehab.

## 2017-01-09 ENCOUNTER — Encounter (HOSPITAL_COMMUNITY)
Admission: RE | Admit: 2017-01-09 | Discharge: 2017-01-09 | Disposition: A | Discharge status: Home / Self Care | Payer: Medicare Other | Source: Ambulatory Visit | Attending: Cardiology | Admitting: Cardiology

## 2017-01-09 DIAGNOSIS — E7800 Pure hypercholesterolemia, unspecified: Secondary | ICD-10-CM | POA: Insufficient documentation

## 2017-01-09 DIAGNOSIS — I2510 Atherosclerotic heart disease of native coronary artery without angina pectoris: Secondary | ICD-10-CM | POA: Diagnosis not present

## 2017-01-09 DIAGNOSIS — I252 Old myocardial infarction: Secondary | ICD-10-CM | POA: Diagnosis not present

## 2017-01-09 DIAGNOSIS — N183 Chronic kidney disease, stage 3 (moderate): Secondary | ICD-10-CM | POA: Diagnosis not present

## 2017-01-09 DIAGNOSIS — Z955 Presence of coronary angioplasty implant and graft: Secondary | ICD-10-CM | POA: Diagnosis not present

## 2017-01-09 DIAGNOSIS — G4730 Sleep apnea, unspecified: Secondary | ICD-10-CM | POA: Insufficient documentation

## 2017-01-09 DIAGNOSIS — Z7902 Long term (current) use of antithrombotics/antiplatelets: Secondary | ICD-10-CM | POA: Insufficient documentation

## 2017-01-09 DIAGNOSIS — M1990 Unspecified osteoarthritis, unspecified site: Secondary | ICD-10-CM | POA: Diagnosis not present

## 2017-01-09 DIAGNOSIS — Z7951 Long term (current) use of inhaled steroids: Secondary | ICD-10-CM | POA: Diagnosis not present

## 2017-01-09 DIAGNOSIS — Z6833 Body mass index (BMI) 33.0-33.9, adult: Secondary | ICD-10-CM | POA: Diagnosis not present

## 2017-01-09 DIAGNOSIS — I129 Hypertensive chronic kidney disease with stage 1 through stage 4 chronic kidney disease, or unspecified chronic kidney disease: Secondary | ICD-10-CM | POA: Insufficient documentation

## 2017-01-09 DIAGNOSIS — E669 Obesity, unspecified: Secondary | ICD-10-CM | POA: Insufficient documentation

## 2017-01-09 DIAGNOSIS — Z79899 Other long term (current) drug therapy: Secondary | ICD-10-CM | POA: Insufficient documentation

## 2017-01-09 DIAGNOSIS — Z7982 Long term (current) use of aspirin: Secondary | ICD-10-CM | POA: Insufficient documentation

## 2017-01-09 DIAGNOSIS — I214 Non-ST elevation (NSTEMI) myocardial infarction: Secondary | ICD-10-CM

## 2017-01-09 NOTE — Progress Notes (Signed)
Cardiac/Pulmonary Rehab Medication Review by a Pharmacist  Does the patient  feel that his/her medications are working for him/her?  yes  Has the patient been experiencing any side effects to the medications prescribed?  no  Does the patient measure his/her own blood pressure or blood glucose at home?  yes   Does the patient have any problems obtaining medications due to transportation or finances?   no  Understanding of regimen: excellent Understanding of indications: excellent Potential of compliance: excellent   Pharmacist comments: [name] daughter helps her with her medications.    [name] 01/09/2017 3:32 PM

## 2017-01-09 NOTE — Progress Notes (Signed)
Daily Session Note  Patient Details  Name: [name] MRN: [id number] Date of Birth: Jul 05, 1934 Referring Provider:     CARDIAC REHAB PHASE II ORIENTATION from 01/03/2017 in [place]  Referring Provider  Dr. Domenic Polite      Encounter Date: 01/09/2017  Check In:     Session Check In - 01/09/17 1545      Check-In   Location [facility]   Staff Present Diane Angelina Pih, MS, EP, [facility], Exercise Physiologist;Kielan Dreisbach Wynetta Emery, RN, BSN   Supervising physician immediately available to respond to emergencies See telemetry face sheet for immediately available MD   Medication changes reported     No   Fall or balance concerns reported    No   Warm-up and Cool-down Performed as group-led instruction   Resistance Training Performed Yes   VAD Patient? No     Pain Assessment   Currently in Pain? No/denies   Pain Score 0-No pain   Multiple Pain Sites No      Capillary Blood Glucose: No results found for this or any previous visit (from the past 24 hour(s)).    History  Smoking Status  . Never Smoker  Smokeless Tobacco  . Never Used    Goals Met:  Independence with exercise equipment Exercise tolerated well No report of cardiac concerns or symptoms Strength training completed today  Goals Unmet:  Not Applicable  Comments: Check out 1645.   Dr. Kate Sable is Medical Director for [facility] Cardiac and Pulmonary Rehab.

## 2017-01-11 ENCOUNTER — Encounter (HOSPITAL_COMMUNITY): Payer: Medicare Other

## 2017-01-14 ENCOUNTER — Encounter (HOSPITAL_COMMUNITY)
Admission: RE | Admit: 2017-01-14 | Discharge: 2017-01-14 | Disposition: A | Discharge status: Home / Self Care | Payer: Medicare Other | Source: Ambulatory Visit | Attending: Cardiology | Admitting: Cardiology

## 2017-01-14 DIAGNOSIS — I214 Non-ST elevation (NSTEMI) myocardial infarction: Secondary | ICD-10-CM

## 2017-01-14 DIAGNOSIS — I2510 Atherosclerotic heart disease of native coronary artery without angina pectoris: Secondary | ICD-10-CM | POA: Diagnosis not present

## 2017-01-14 DIAGNOSIS — Z79899 Other long term (current) drug therapy: Secondary | ICD-10-CM | POA: Diagnosis not present

## 2017-01-14 DIAGNOSIS — I252 Old myocardial infarction: Secondary | ICD-10-CM | POA: Diagnosis not present

## 2017-01-14 DIAGNOSIS — Z955 Presence of coronary angioplasty implant and graft: Secondary | ICD-10-CM | POA: Diagnosis not present

## 2017-01-14 DIAGNOSIS — I129 Hypertensive chronic kidney disease with stage 1 through stage 4 chronic kidney disease, or unspecified chronic kidney disease: Secondary | ICD-10-CM | POA: Diagnosis not present

## 2017-01-14 DIAGNOSIS — E669 Obesity, unspecified: Secondary | ICD-10-CM | POA: Diagnosis not present

## 2017-01-14 NOTE — Progress Notes (Signed)
Daily Session Note  Patient Details  Name: SESILIA POUCHER MRN: [id number] Date of Birth: 02-21-1934 Referring Provider:     CARDIAC REHAB PHASE II ORIENTATION from 01/03/2017 in [place]  Referring Provider  Dr. Domenic Polite      Encounter Date: 01/14/2017  Check In:     Session Check In - 01/14/17 1545      Check-In   Location [facility]   Staff Present Diane Angelina Pih, MS, EP, [facility], Exercise Physiologist;Koleman Marling Wynetta Emery, RN, BSN   Supervising physician immediately available to respond to emergencies See telemetry face sheet for immediately available MD   Medication changes reported     No   Fall or balance concerns reported    No   Warm-up and Cool-down Performed as group-led instruction   Resistance Training Performed Yes   VAD Patient? No     Pain Assessment   Currently in Pain? No/denies   Pain Score 0-No pain   Multiple Pain Sites No      Capillary Blood Glucose: No results found for this or any previous visit (from the past 24 hour(s)).    History  Smoking Status  . Never Smoker  Smokeless Tobacco  . Never Used    Goals Met:  Independence with exercise equipment Exercise tolerated well No report of cardiac concerns or symptoms Strength training completed today  Goals Unmet:  Not Applicable  Comments: Check out 1645.   Dr. Kate Sable is Medical Director for [facility] Cardiac and Pulmonary Rehab.

## 2017-01-15 DIAGNOSIS — M25562 Pain in left knee: Secondary | ICD-10-CM | POA: Diagnosis not present

## 2017-01-15 DIAGNOSIS — M170 Bilateral primary osteoarthritis of knee: Secondary | ICD-10-CM | POA: Diagnosis not present

## 2017-01-15 DIAGNOSIS — M25561 Pain in right knee: Secondary | ICD-10-CM | POA: Diagnosis not present

## 2017-01-16 ENCOUNTER — Encounter (HOSPITAL_COMMUNITY)
Admission: RE | Admit: 2017-01-16 | Discharge: 2017-01-16 | Disposition: A | Discharge status: Home / Self Care | Payer: Medicare Other | Source: Ambulatory Visit | Attending: Cardiology | Admitting: Cardiology

## 2017-01-16 DIAGNOSIS — E669 Obesity, unspecified: Secondary | ICD-10-CM | POA: Diagnosis not present

## 2017-01-16 DIAGNOSIS — Z79899 Other long term (current) drug therapy: Secondary | ICD-10-CM | POA: Diagnosis not present

## 2017-01-16 DIAGNOSIS — I129 Hypertensive chronic kidney disease with stage 1 through stage 4 chronic kidney disease, or unspecified chronic kidney disease: Secondary | ICD-10-CM | POA: Diagnosis not present

## 2017-01-16 DIAGNOSIS — Z955 Presence of coronary angioplasty implant and graft: Secondary | ICD-10-CM | POA: Diagnosis not present

## 2017-01-16 DIAGNOSIS — I2510 Atherosclerotic heart disease of native coronary artery without angina pectoris: Secondary | ICD-10-CM | POA: Diagnosis not present

## 2017-01-16 DIAGNOSIS — I252 Old myocardial infarction: Secondary | ICD-10-CM | POA: Diagnosis not present

## 2017-01-16 NOTE — Progress Notes (Signed)
Daily Session Note  Patient Details  Name: FAITHANN NATAL MRN: [id number] Date of Birth: 04-19-1934 Referring Provider:     CARDIAC REHAB PHASE II ORIENTATION from 01/03/2017 in [place]  Referring Provider  Dr. Domenic Polite      Encounter Date: 01/16/2017  Check In:     Session Check In - 01/16/17 1537      Check-In   Location [facility]   Staff Present Shareka Casale Angelina Pih, MS, EP, [facility], Exercise Physiologist;Debra Wynetta Emery, RN, BSN   Supervising physician immediately available to respond to emergencies See telemetry face sheet for immediately available MD   Medication changes reported     No   Fall or balance concerns reported    No   Tobacco Cessation No Change   Warm-up and Cool-down Performed as group-led instruction   Resistance Training Performed Yes   VAD Patient? No     Pain Assessment   Currently in Pain? No/denies   Pain Score 0-No pain   Multiple Pain Sites No      Capillary Blood Glucose: No results found for this or any previous visit (from the past 24 hour(s)).    History  Smoking Status  . Never Smoker  Smokeless Tobacco  . Never Used    Goals Met:  Independence with exercise equipment Exercise tolerated well No report of cardiac concerns or symptoms Strength training completed today  Goals Unmet:  Not Applicable  Comments: Check out: [place]   Dr. Kate Sable is Medical Director for [place] and Pulmonary Rehab.

## 2017-01-18 ENCOUNTER — Encounter (HOSPITAL_COMMUNITY): Admission: RE | Admit: 2017-01-18 | Payer: Medicare Other | Source: Ambulatory Visit

## 2017-01-21 ENCOUNTER — Encounter (HOSPITAL_COMMUNITY)
Admission: RE | Admit: 2017-01-21 | Discharge: 2017-01-21 | Disposition: A | Discharge status: Home / Self Care | Payer: Medicare Other | Source: Ambulatory Visit | Attending: Cardiology | Admitting: Cardiology

## 2017-01-21 DIAGNOSIS — Z79899 Other long term (current) drug therapy: Secondary | ICD-10-CM | POA: Diagnosis not present

## 2017-01-21 DIAGNOSIS — E669 Obesity, unspecified: Secondary | ICD-10-CM | POA: Diagnosis not present

## 2017-01-21 DIAGNOSIS — Z955 Presence of coronary angioplasty implant and graft: Secondary | ICD-10-CM | POA: Diagnosis not present

## 2017-01-21 DIAGNOSIS — I129 Hypertensive chronic kidney disease with stage 1 through stage 4 chronic kidney disease, or unspecified chronic kidney disease: Secondary | ICD-10-CM | POA: Diagnosis not present

## 2017-01-21 DIAGNOSIS — I2510 Atherosclerotic heart disease of native coronary artery without angina pectoris: Secondary | ICD-10-CM | POA: Diagnosis not present

## 2017-01-21 DIAGNOSIS — I214 Non-ST elevation (NSTEMI) myocardial infarction: Secondary | ICD-10-CM

## 2017-01-21 DIAGNOSIS — I252 Old myocardial infarction: Secondary | ICD-10-CM | POA: Diagnosis not present

## 2017-01-21 NOTE — Progress Notes (Signed)
Daily Session Note  Patient Details  Name: [name] MRN: [id number] Date of Birth: 04-May-1934 Referring Provider:     CARDIAC REHAB PHASE II ORIENTATION from 01/03/2017 in [place]  Referring Provider  Dr. Domenic Polite      Encounter Date: 01/21/2017  Check In:     Session Check In - 01/21/17 1553      Check-In   Location [facility]   Staff Present Aundra Dubin, RN, BSN;Yordy Matton Luther Parody, BS, EP, Exercise Physiologist   Supervising physician immediately available to respond to emergencies See telemetry face sheet for immediately available MD   Medication changes reported     No   Fall or balance concerns reported    No   Warm-up and Cool-down Performed as group-led instruction   Resistance Training Performed Yes   VAD Patient? No     Pain Assessment   Currently in Pain? No/denies   Pain Score 0-No pain   Multiple Pain Sites No      Capillary Blood Glucose: No results found for this or any previous visit (from the past 24 hour(s)).    History  Smoking Status  . Never Smoker  Smokeless Tobacco  . Never Used    Goals Met:  Independence with exercise equipment Exercise tolerated well No report of cardiac concerns or symptoms Strength training completed today  Goals Unmet:  Not Applicable  Comments: Check out 445   Dr. Kate Sable is Medical Director for [place] and Pulmonary Rehab.

## 2017-01-23 ENCOUNTER — Other Ambulatory Visit: Payer: Self-pay | Admitting: Cardiovascular Disease

## 2017-01-23 ENCOUNTER — Encounter (HOSPITAL_COMMUNITY): Payer: Medicare Other

## 2017-01-23 DIAGNOSIS — M1711 Unilateral primary osteoarthritis, right knee: Secondary | ICD-10-CM | POA: Diagnosis not present

## 2017-01-23 DIAGNOSIS — M25561 Pain in right knee: Secondary | ICD-10-CM | POA: Diagnosis not present

## 2017-01-25 ENCOUNTER — Encounter (HOSPITAL_COMMUNITY)
Admission: RE | Admit: 2017-01-25 | Discharge: 2017-01-25 | Disposition: A | Discharge status: Home / Self Care | Payer: Medicare Other | Source: Ambulatory Visit | Attending: Cardiology | Admitting: Cardiology

## 2017-01-25 DIAGNOSIS — I129 Hypertensive chronic kidney disease with stage 1 through stage 4 chronic kidney disease, or unspecified chronic kidney disease: Secondary | ICD-10-CM | POA: Diagnosis not present

## 2017-01-25 DIAGNOSIS — E669 Obesity, unspecified: Secondary | ICD-10-CM | POA: Diagnosis not present

## 2017-01-25 DIAGNOSIS — Z955 Presence of coronary angioplasty implant and graft: Secondary | ICD-10-CM | POA: Diagnosis not present

## 2017-01-25 DIAGNOSIS — I2510 Atherosclerotic heart disease of native coronary artery without angina pectoris: Secondary | ICD-10-CM | POA: Diagnosis not present

## 2017-01-25 DIAGNOSIS — I214 Non-ST elevation (NSTEMI) myocardial infarction: Secondary | ICD-10-CM

## 2017-01-25 DIAGNOSIS — I252 Old myocardial infarction: Secondary | ICD-10-CM | POA: Diagnosis not present

## 2017-01-25 DIAGNOSIS — Z79899 Other long term (current) drug therapy: Secondary | ICD-10-CM | POA: Diagnosis not present

## 2017-01-25 NOTE — Progress Notes (Signed)
Daily Session Note  Patient Details  Name: [name] MRN: [id number] Date of Birth: March 25, 1934 Referring Provider:     CARDIAC REHAB PHASE II ORIENTATION from 01/03/2017 in [place]  Referring Provider  Dr. Domenic Polite      Encounter Date: 01/25/2017  Check In:     Session Check In - 01/25/17 1535      Check-In   Location [facility]   Staff Present Diane Angelina Pih, MS, EP, [facility], Exercise Physiologist;Shamina Etheridge Luther Parody, BS, EP, Exercise Physiologist   Supervising physician immediately available to respond to emergencies See telemetry face sheet for immediately available MD   Medication changes reported     No   Fall or balance concerns reported    No   Warm-up and Cool-down Performed as group-led instruction   Resistance Training Performed Yes   VAD Patient? No     Pain Assessment   Currently in Pain? No/denies   Pain Score 0-No pain   Multiple Pain Sites No      Capillary Blood Glucose: No results found for this or any previous visit (from the past 24 hour(s)).    History  Smoking Status  . Never Smoker  Smokeless Tobacco  . Never Used    Goals Met:  Independence with exercise equipment Exercise tolerated well  Goals Unmet:  None  Comments: Check out 445   Dr. Kate Sable is Medical Director for [place] and [facility].

## 2017-01-28 ENCOUNTER — Encounter (HOSPITAL_COMMUNITY)
Admission: RE | Admit: 2017-01-28 | Discharge: 2017-01-28 | Disposition: A | Discharge status: Home / Self Care | Payer: Medicare Other | Source: Ambulatory Visit | Attending: Cardiology | Admitting: Cardiology

## 2017-01-28 DIAGNOSIS — Z79899 Other long term (current) drug therapy: Secondary | ICD-10-CM | POA: Diagnosis not present

## 2017-01-28 DIAGNOSIS — I2510 Atherosclerotic heart disease of native coronary artery without angina pectoris: Secondary | ICD-10-CM | POA: Diagnosis not present

## 2017-01-28 DIAGNOSIS — I129 Hypertensive chronic kidney disease with stage 1 through stage 4 chronic kidney disease, or unspecified chronic kidney disease: Secondary | ICD-10-CM | POA: Diagnosis not present

## 2017-01-28 DIAGNOSIS — I252 Old myocardial infarction: Secondary | ICD-10-CM | POA: Diagnosis not present

## 2017-01-28 DIAGNOSIS — E669 Obesity, unspecified: Secondary | ICD-10-CM | POA: Diagnosis not present

## 2017-01-28 DIAGNOSIS — Z955 Presence of coronary angioplasty implant and graft: Secondary | ICD-10-CM | POA: Diagnosis not present

## 2017-01-28 NOTE — Progress Notes (Signed)
Daily Session Note  Patient Details  Name: [name] MRN: [id number] Date of Birth: 06/11/1934 Referring Provider:     CARDIAC REHAB PHASE II ORIENTATION from 01/03/2017 in [place]  Referring Provider  Dr. Domenic Polite      Encounter Date: 01/28/2017  Check In:     Session Check In - 01/28/17 1620      Check-In   Location [facility]   Staff Present Russella Dar, MS, EP, [facility], Exercise Physiologist;Gregory Luther Parody, BS, EP, Exercise Physiologist   Supervising physician immediately available to respond to emergencies See telemetry face sheet for immediately available MD   Medication changes reported     No   Fall or balance concerns reported    No   Tobacco Cessation No Change   Warm-up and Cool-down Performed as group-led instruction   Resistance Training Performed Yes   VAD Patient? No     Pain Assessment   Currently in Pain? No/denies   Pain Score 0-No pain   Multiple Pain Sites No      Capillary Blood Glucose: No results found for this or any previous visit (from the past 24 hour(s)).      Exercise Prescription Changes - 01/28/17 1400      Response to Exercise   Blood Pressure (Admit) 130/70   Blood Pressure (Exercise) 144/66   Blood Pressure (Exit) 122/60   Heart Rate (Admit) 77 bpm   Heart Rate (Exercise) 103 bpm   Heart Rate (Exit) 79 bpm   Rating of Perceived Exertion (Exercise) 11   Duration Progress to 30 minutes of  aerobic without signs/symptoms of physical distress   Intensity THRR unchanged     Progression   Progression Continue to progress workloads to maintain intensity without signs/symptoms of physical distress.     Resistance Training   Training Prescription Yes   Weight 1   Reps 10-15     NuStep   Level 2   SPM 33   Minutes 30   METs 3.58     Home Exercise Plan   Plans to continue exercise at Home (comment)   Frequency Add 2 additional days to program exercise sessions.      History   Smoking Status  . Never Smoker  Smokeless Tobacco  . Never Used    Goals Met:  Independence with exercise equipment Exercise tolerated well No report of cardiac concerns or symptoms Strength training completed today  Goals Unmet:  Not Applicable  Comments: Check out: [place]   Dr. Kate Sable is Medical Director for [facility] and Pulmonary Rehab.

## 2017-01-29 DIAGNOSIS — M25562 Pain in left knee: Secondary | ICD-10-CM | POA: Diagnosis not present

## 2017-01-29 DIAGNOSIS — M1712 Unilateral primary osteoarthritis, left knee: Secondary | ICD-10-CM | POA: Diagnosis not present

## 2017-01-30 ENCOUNTER — Encounter (HOSPITAL_COMMUNITY)
Admission: RE | Admit: 2017-01-30 | Discharge: 2017-01-30 | Disposition: A | Discharge status: Home / Self Care | Payer: Medicare Other | Source: Ambulatory Visit | Attending: Cardiology | Admitting: Cardiology

## 2017-01-30 DIAGNOSIS — I214 Non-ST elevation (NSTEMI) myocardial infarction: Secondary | ICD-10-CM

## 2017-01-30 DIAGNOSIS — E669 Obesity, unspecified: Secondary | ICD-10-CM | POA: Diagnosis not present

## 2017-01-30 DIAGNOSIS — I252 Old myocardial infarction: Secondary | ICD-10-CM | POA: Diagnosis not present

## 2017-01-30 DIAGNOSIS — I2510 Atherosclerotic heart disease of native coronary artery without angina pectoris: Secondary | ICD-10-CM | POA: Diagnosis not present

## 2017-01-30 DIAGNOSIS — Z79899 Other long term (current) drug therapy: Secondary | ICD-10-CM | POA: Diagnosis not present

## 2017-01-30 DIAGNOSIS — I129 Hypertensive chronic kidney disease with stage 1 through stage 4 chronic kidney disease, or unspecified chronic kidney disease: Secondary | ICD-10-CM | POA: Diagnosis not present

## 2017-01-30 DIAGNOSIS — Z955 Presence of coronary angioplasty implant and graft: Secondary | ICD-10-CM

## 2017-01-30 NOTE — Progress Notes (Signed)
Daily Session Note  Patient Details  Name: [name] MRN: [id number] Date of Birth: [birth year] Referring Provider:     CARDIAC REHAB PHASE II ORIENTATION from 01/03/2017 in [place]  Referring Provider  Dr. Domenic Polite      Encounter Date: 01/30/2017  Check In:     Session Check In - 01/30/17 1545      Check-In   Location [facility]   Staff Present Diane Angelina Pih, MS, EP, [facility], Exercise Physiologist;Menucha Dicesare Wynetta Emery, RN, BSN   Supervising physician immediately available to respond to emergencies See telemetry face sheet for immediately available MD   Medication changes reported     No   Fall or balance concerns reported    No   Warm-up and Cool-down Performed as group-led instruction   Resistance Training Performed Yes   VAD Patient? No     Pain Assessment   Currently in Pain? No/denies   Pain Score 0-No pain   Multiple Pain Sites No      Capillary Blood Glucose: No results found for this or any previous visit (from the past 24 hour(s)).    History  Smoking Status  . Never Smoker  Smokeless Tobacco  . Never Used    Goals Met:  Independence with exercise equipment Exercise tolerated well No report of cardiac concerns or symptoms Strength training completed today  Goals Unmet:  Not Applicable  Comments: Check out 1645.   Dr. Kate Sable is Medical Director for [facility].

## 2017-01-31 ENCOUNTER — Telehealth: Payer: Self-pay | Admitting: Physician Assistant

## 2017-01-31 NOTE — Telephone Encounter (Signed)
Coreg d/c'd at hospital discharge on 11/20/16, Donnie at [facility] made aware

## 2017-01-31 NOTE — Telephone Encounter (Signed)
I spoke with patient and told her I spoke with Donnie at [facility] pharmacy and made them aware that she is not on Coreg anymore, and that it was d/c'd at hospital discharge on 11/20/16.

## 2017-01-31 NOTE — Telephone Encounter (Signed)
Pt's daughter came into office stating pt has been taken off the Coreg, but they are getting calls from [facility] that the Rx has been filled.

## 2017-01-31 NOTE — Progress Notes (Signed)
Cardiac Individual Treatment Plan  Patient Details  Name: [name] MRN: [id number] Date of Birth: 07-01-1934 Referring Provider:     CARDIAC REHAB PHASE II ORIENTATION from 01/03/2017 in [facility] CARDIAC REHABILITATION  Referring Provider  Dr. Diona Browner      Initial Encounter Date:    CARDIAC REHAB PHASE II ORIENTATION from 01/03/2017 in [place] CARDIAC REHABILITATION  Date  01/03/17  Referring Provider  Dr. Diona Browner      Visit Diagnosis: NSTEMI (non-ST elevated myocardial infarction) [facility])  Status post coronary artery stent placement  Patient's Home Medications on Admission:  Current Outpatient Prescriptions:  .  acetaminophen (TYLENOL) 325 MG tablet, Take 325-650 mg by mouth every 6 (six) hours as needed (for headaches)., Disp: , Rfl:  .  amLODipine (NORVASC) 5 MG tablet, Take 5 mg by mouth daily. , Disp: , Rfl: 3 .  aspirin 81 MG tablet, Take 81 mg by mouth daily., Disp: , Rfl:  .  carvedilol (COREG) 6.25 MG tablet, TAKE 1 TABLET (6.25 MG TOTAL) BY MOUTH 2 (TWO) TIMES DAILY., Disp: 180 tablet, Rfl: 3 .  fluticasone (FLONASE) 50 MCG/ACT nasal spray, Place 1 spray into both nostrils as needed for allergies or rhinitis. , Disp: , Rfl:  .  furosemide (LASIX) 40 MG tablet, TAKE 1 TABLET BY MOUTH DAILY AS NEEDED FOR EDEMA, Disp: 30 tablet, Rfl: 0 .  hydrALAZINE (APRESOLINE) 100 MG tablet, Take 0.5 tablets (50 mg total) by mouth 3 (three) times daily., Disp: 45 tablet, Rfl: 6 .  Iron Succinyl-Protein Complex 40 MG/15ML SOLN, Take 40 mg by mouth daily., Disp: , Rfl:  .  losartan (COZAAR) 50 MG tablet, Take 1 tablet (50 mg total) by mouth daily., Disp: 30 tablet, Rfl: 5 .  nitroGLYCERIN (NITROSTAT) 0.4 MG SL tablet, Place 1 tablet (0.4 mg total) under the tongue every 5 (five) minutes as needed., Disp: 25 tablet, Rfl: 3 .  pantoprazole (PROTONIX) 40 MG tablet, Take 1 tablet (40 mg total) by mouth daily., Disp: 30 tablet, Rfl: 6 .  rosuvastatin (CRESTOR) 40 MG tablet, Take 1  tablet (40 mg total) by mouth daily., Disp: 90 tablet, Rfl: 3 .  ticagrelor (BRILINTA) 90 MG TABS tablet, Take 1 tablet (90 mg total) by mouth 2 (two) times daily., Disp: 180 tablet, Rfl: 3 .  Vitamin D, Ergocalciferol, (DRISDOL) 50000 units CAPS capsule, Take 50,000 Units by mouth every 30 (thirty) days., Disp: , Rfl: 0  Past Medical History: Past Medical History:  Diagnosis Date  . Arthritis   . CKD (chronic kidney disease), stage III   . Complete heart block (HCC) 11/16/2016   a. transient during NSTEMI, resolved with revascularization.  . Coronary artery disease 11/2009   a. with prior stent placement to the ramus intermedius and RCA. b. NSTEMI 11/2016 s/p DES to DES to distal Cx with moderate residual dz.  . High cholesterol   . Hypertension   . Non-ST elevation (NSTEMI) myocardial infarction (HCC) 11/16/2016  . Obesity   . Reflux esophagitis   . Sleep apnea 09/27/2010   Untreated, REM 64.7/hr AHI 18.5/hr RDI 19.2/hr. Patuent refused CPAP therapy    Tobacco Use: History  Smoking Status  . Never Smoker  Smokeless Tobacco  . Never Used    Labs: Recent Review Flowsheet Data    Labs for ITP Cardiac and Pulmonary Rehab Latest Ref Rng & Units 03/02/2014 11/16/2014 01/19/2015 10/20/2015 11/17/2016   Cholestrol 0 - 200 mg/dL 409 811(B) 147 - 829(F)   LDLCALC 0 - 99  mg/dL 98 161(W141(H) 77 - 960(A163(H)   HDL >40 mg/dL 56 44 48 - 54(U37(L)   Trlycerides <150 mg/dL 87 981(X168(H) 914104 - 89   Hemoglobin A1c <5.7 % 6.0(H) - - - -   TCO2 0 - 100 mmol/L - - - 23 -      Capillary Blood Glucose: Lab Results  Component Value Date   GLUCAP 163 (H) 07/13/2014   GLUCAP 110 (H) 07/12/2014   GLUCAP 110 (H) 07/11/2014     Exercise Target Goals:    Exercise Program Goal: Individual exercise prescription set with THRR, safety & activity barriers. Participant demonstrates ability to understand and report RPE using BORG scale, to self-measure pulse accurately, and to acknowledge the importance of the exercise  prescription.  Exercise Prescription Goal: Starting with aerobic activity 30 plus minutes a day, 3 days per week for initial exercise prescription. Provide home exercise prescription and guidelines that participant acknowledges understanding prior to discharge.  Activity Barriers & Risk Stratification:   6 Minute Walk:   Oxygen Initial Assessment:   Oxygen Re-Evaluation:   Oxygen Discharge (Final Oxygen Re-Evaluation):   Initial Exercise Prescription:     Initial Exercise Prescription - 01/03/17 1100      Date of Initial Exercise RX and Referring Provider   Date 01/03/17   Referring Provider Dr. Diona BrownerMcDowell     NuStep   Level 2   SPM 10   Minutes 15   METs 1.8     Arm Ergometer   Level 1.5   Watts 11   RPM 11   Minutes 20   METs 1.8     Prescription Details   Frequency (times per week) 3   Duration Progress to 30 minutes of continuous aerobic without signs/symptoms of physical distress     Intensity   THRR 40-80% of Max Heartrate 106-118-129   Ratings of Perceived Exertion 11-13   Perceived Dyspnea 0-4     Progression   Progression Continue progressive overload as per policy without signs/symptoms or physical distress.     Resistance Training   Training Prescription Yes   Weight 1   Reps 10-15      Perform Capillary Blood Glucose checks as needed.  Exercise Prescription Changes:      Exercise Prescription Changes    Row Name 01/28/17 1400             Response to Exercise   Blood Pressure (Admit) 130/70       Blood Pressure (Exercise) 144/66       Blood Pressure (Exit) 122/60       Heart Rate (Admit) 77 bpm       Heart Rate (Exercise) 103 bpm       Heart Rate (Exit) 79 bpm       Rating of Perceived Exertion (Exercise) 11       Duration Progress to 30 minutes of  aerobic without signs/symptoms of physical distress       Intensity THRR unchanged         Progression   Progression Continue to progress workloads to maintain intensity  without signs/symptoms of physical distress.         Resistance Training   Training Prescription Yes       Weight 1       Reps 10-15         NuStep   Level 2       SPM 33       Minutes 30  METs 3.58         Home Exercise Plan   Plans to continue exercise at Home (comment)       Frequency Add 2 additional days to program exercise sessions.          Exercise Comments:      Exercise Comments    Row Name 01/28/17 1442           Exercise Comments Patient was moved to only the nustep due to back and shoulder issues with the arm crank. Her level has remained the same but her watts have really increased.           Exercise Goals and Review:      Exercise Goals    Row Name 01/03/17 1224             Exercise Goals   Intervention Provide advice, education, support and counseling about physical activity/exercise needs.;Develop an individualized exercise prescription for aerobic and resistive training based on initial evaluation findings, risk stratification, comorbidities and participant's personal goals.       Expected Outcomes Achievement of increased cardiorespiratory fitness and enhanced flexibility, muscular endurance and strength shown through measurements of functional capacity and personal statement of participant.       Increase Strength and Stamina Yes       Intervention Provide advice, education, support and counseling about physical activity/exercise needs.;Develop an individualized exercise prescription for aerobic and resistive training based on initial evaluation findings, risk stratification, comorbidities and participant's personal goals.       Expected Outcomes Achievement of increased cardiorespiratory fitness and enhanced flexibility, muscular endurance and strength shown through measurements of functional capacity and personal statement of participant.          Exercise Goals Re-Evaluation :     Exercise Goals Re-Evaluation    Row Name 01/31/17  0801             Exercise Goal Re-Evaluation   Exercise Goals Review Increase Physical Activity;Increase Strenth and Stamina       Comments After 9 sessions, patient states she feels stronger and believes the program is benefiting her. Her chronic knee pain impedes her progress but she is doing well.        Expected Outcomes Patient will complete the program with increased strength, stamina, and activity.           Discharge Exercise Prescription (Final Exercise Prescription Changes):     Exercise Prescription Changes - 01/28/17 1400      Response to Exercise   Blood Pressure (Admit) 130/70   Blood Pressure (Exercise) 144/66   Blood Pressure (Exit) 122/60   Heart Rate (Admit) 77 bpm   Heart Rate (Exercise) 103 bpm   Heart Rate (Exit) 79 bpm   Rating of Perceived Exertion (Exercise) 11   Duration Progress to 30 minutes of  aerobic without signs/symptoms of physical distress   Intensity THRR unchanged     Progression   Progression Continue to progress workloads to maintain intensity without signs/symptoms of physical distress.     Resistance Training   Training Prescription Yes   Weight 1   Reps 10-15     NuStep   Level 2   SPM 33   Minutes 30   METs 3.58     Home Exercise Plan   Plans to continue exercise at Home (comment)   Frequency Add 2 additional days to program exercise sessions.      Nutrition:  Target Goals: Understanding of nutrition guidelines,  daily intake of sodium 1500mg , cholesterol 200mg , calories 30% from fat and 7% or less from saturated fats, daily to have 5 or more servings of fruits and vegetables.  Biometrics:     Pre Biometrics - 01/03/17 1157      Pre Biometrics   Height 5\' 2"  (1.575 m)   Waist Circumference 42 inches   Hip Circumference 42 inches   Waist to Hip Ratio 1 %   BMI (Calculated) 33.9   Triceps Skinfold 14 mm   % Body Fat 43.5 %   Grip Strength --  Machine Broken   Flexibility 0 in   Single Leg Stand 0 seconds        Nutrition Therapy Plan and Nutrition Goals:   Nutrition Discharge: Rate Your Plate Scores:   Nutrition Goals Re-Evaluation:   Nutrition Goals Discharge (Final Nutrition Goals Re-Evaluation):   Psychosocial: Target Goals: Acknowledge presence or absence of significant depression and/or stress, maximize coping skills, provide positive support system. Participant is able to verbalize types and ability to use techniques and skills needed for reducing stress and depression.  Initial Review & Psychosocial Screening:     Initial Psych Review & Screening - 01/03/17 1228      Initial Review   Current issues with None Identified     Family Dynamics   Good Support System? Yes     Barriers   Psychosocial barriers to participate in program There are no identifiable barriers or psychosocial needs.  QOL score 24.89     Screening Interventions   Interventions Encouraged to exercise      Quality of Life Scores:     Quality of Life - 01/03/17 1158      Quality of Life Scores   Health/Function Pre 21.93 %   Socioeconomic Pre 24.17 %   Psych/Spiritual Pre 30 %   Family Pre 27.5 %   GLOBAL Pre 24.89 %      PHQ-9: Recent Review Flowsheet Data    Depression screen [facility] 2/9 01/03/2017   Decreased Interest 0   Down, Depressed, Hopeless 0   PHQ - 2 Score 0   Altered sleeping 0   Tired, decreased energy 1   Change in appetite 1   Feeling bad or failure about yourself  0   Trouble concentrating 0   Moving slowly or fidgety/restless 0   Suicidal thoughts 0   PHQ-9 Score 2   Difficult doing work/chores Not difficult at all     Interpretation of Total Score  Total Score Depression Severity:  1-4 = Minimal depression, 5-9 = Mild depression, 10-14 = Moderate depression, 15-19 = Moderately severe depression, 20-27 = Severe depression   Psychosocial Evaluation and Intervention:     Psychosocial Evaluation - 01/03/17 1228      Psychosocial Evaluation & Interventions    Interventions Encouraged to exercise with the program and follow exercise prescription   Continue Psychosocial Services  No Follow up required      Psychosocial Re-Evaluation:     Psychosocial Re-Evaluation    Row Name 01/31/17 0802             Psychosocial Re-Evaluation   Current issues with None Identified       Expected Outcomes Patient will have no psychosocial barriers identified at discharge.        Interventions Encouraged to attend Cardiac Rehabilitation for the exercise       Continue Psychosocial Services  No Follow up required  Psychosocial Discharge (Final Psychosocial Re-Evaluation):     Psychosocial Re-Evaluation - 01/31/17 0802      Psychosocial Re-Evaluation   Current issues with None Identified   Expected Outcomes Patient will have no psychosocial barriers identified at discharge.    Interventions Encouraged to attend Cardiac Rehabilitation for the exercise   Continue Psychosocial Services  No Follow up required      Vocational Rehabilitation: Provide vocational rehab assistance to qualifying candidates.   Vocational Rehab Evaluation & Intervention:     Vocational Rehab - 01/03/17 1220      Initial Vocational Rehab Evaluation & Intervention   Assessment shows need for Vocational Rehabilitation No      Education: Education Goals: Education classes will be provided on a weekly basis, covering required topics. Participant will state understanding/return demonstration of topics presented.  Learning Barriers/Preferences:     Learning Barriers/Preferences - 01/03/17 1220      Learning Barriers/Preferences   Learning Barriers None   Learning Preferences Pictoral;Video      Education Topics: Hypertension, Hypertension Reduction -Define heart disease and high blood pressure. Discus how high blood pressure affects the body and ways to reduce high blood pressure.   CARDIAC REHAB PHASE II EXERCISE from 01/16/2017 in [place] CARDIAC  REHABILITATION  Date  01/09/17  Educator  Hart [place]  Instruction Review Code  2- meets goals/outcomes      Exercise and Your Heart -Discuss why it is important to exercise, the FITT principles of exercise, normal and abnormal responses to exercise, and how to exercise safely.   CARDIAC REHAB PHASE II EXERCISE from 01/16/2017 in [place] CARDIAC REHABILITATION  Date  01/17/17  Educator  DC  Instruction Review Code  2- meets goals/outcomes      Angina -Discuss definition of angina, causes of angina, treatment of angina, and how to decrease risk of having angina.   Cardiac Medications -Review what the following cardiac medications are used for, how they affect the body, and side effects that may occur when taking the medications.  Medications include Aspirin, Beta blockers, calcium channel blockers, ACE Inhibitors, angiotensin receptor blockers, diuretics, digoxin, and antihyperlipidemics.   Congestive Heart Failure -Discuss the definition of CHF, how to live with CHF, the signs and symptoms of CHF, and how keep track of weight and sodium intake.   Heart Disease and Intimacy -Discus the effect sexual activity has on the heart, how changes occur during intimacy as we age, and safety during sexual activity.   Smoking Cessation / COPD -Discuss different methods to quit smoking, the health benefits of quitting smoking, and the definition of COPD.   Nutrition I: Fats -Discuss the types of cholesterol, what cholesterol does to the heart, and how cholesterol levels can be controlled.   Nutrition II: Labels -Discuss the different components of food labels and how to read food label   Heart Parts and Heart Disease -Discuss the anatomy of the heart, the pathway of blood circulation through the heart, and these are affected by heart disease.   Stress I: Signs and Symptoms -Discuss the causes of stress, how stress may lead to anxiety and depression, and ways to limit  stress.   Stress II: Relaxation -Discuss different types of relaxation techniques to limit stress.   Warning Signs of Stroke / TIA -Discuss definition of a stroke, what the signs and symptoms are of a stroke, and how to identify when someone is having stroke.   Knowledge Questionnaire Score:     Knowledge Questionnaire Score -  01/03/17 1220      Knowledge Questionnaire Score   Pre Score 16/24      Core Components/Risk Factors/Patient Goals at Admission:     Personal Goals and Risk Factors at Admission - 01/03/17 1225      Core Components/Risk Factors/Patient Goals on Admission    Weight Management Weight Maintenance   Lipids Yes   Intervention Provide education and support for participant on nutrition & aerobic/resistive exercise along with prescribed medications to achieve LDL 70mg , HDL >40mg .   Expected Outcomes Short Term: Participant states understanding of desired cholesterol values and is compliant with medications prescribed. Participant is following exercise prescription and nutrition guidelines.;Long Term: Cholesterol controlled with medications as prescribed, with individualized exercise RX and with personalized nutrition plan. Value goals: LDL < 70mg , HDL > 40 mg.   Personal Goal Other Yes   Personal Goal Become stronger, Do my activities and not give out of breath   Intervention Attend CR 3 x week and supplement exercise at home 2 x week.    Expected Outcomes Reach personal goals.       Core Components/Risk Factors/Patient Goals Review:      Goals and Risk Factor Review    Row Name 01/03/17 1227 01/31/17 0758           Core Components/Risk Factors/Patient Goals Review   Personal Goals Review Weight Management/Obesity;Improve shortness of breath with ADL's Weight Management/Obesity;Improve shortness of breath with ADL's      Review  - Patient has completed 9 sessions maintaining her weight. She has chronic OA in her knees with chronic constant pain. She  says her legs already feel stronger with the 9 sessions. She continues to have SOB with exerction. Her gait is very unstable using a cane. She also has a walker she uses at home. Will continue to monitor for progress.       Expected Outcomes  - Patient will continue to attend sessions meeting her personal goals.          Core Components/Risk Factors/Patient Goals at Discharge (Final Review):      Goals and Risk Factor Review - 01/31/17 0758      Core Components/Risk Factors/Patient Goals Review   Personal Goals Review Weight Management/Obesity;Improve shortness of breath with ADL's   Review Patient has completed 9 sessions maintaining her weight. She has chronic OA in her knees with chronic constant pain. She says her legs already feel stronger with the 9 sessions. She continues to have SOB with exerction. Her gait is very unstable using a cane. She also has a walker she uses at home. Will continue to monitor for progress.    Expected Outcomes Patient will continue to attend sessions meeting her personal goals.       ITP Comments:     ITP Comments    Row Name 01/03/17 1622           ITP Comments Patient new to program. Plans to start Monday 01/07/17.          Comments: ITP 30 Day REVIEW Patient doing well in the program. Will continue to monitor for progress.

## 2017-02-01 ENCOUNTER — Encounter (HOSPITAL_COMMUNITY)
Admission: RE | Admit: 2017-02-01 | Discharge: 2017-02-01 | Disposition: A | Discharge status: Home / Self Care | Payer: Medicare Other | Source: Ambulatory Visit | Attending: Cardiology | Admitting: Cardiology

## 2017-02-01 DIAGNOSIS — Z955 Presence of coronary angioplasty implant and graft: Secondary | ICD-10-CM

## 2017-02-01 DIAGNOSIS — I252 Old myocardial infarction: Secondary | ICD-10-CM | POA: Diagnosis not present

## 2017-02-01 DIAGNOSIS — I129 Hypertensive chronic kidney disease with stage 1 through stage 4 chronic kidney disease, or unspecified chronic kidney disease: Secondary | ICD-10-CM | POA: Diagnosis not present

## 2017-02-01 DIAGNOSIS — I2510 Atherosclerotic heart disease of native coronary artery without angina pectoris: Secondary | ICD-10-CM | POA: Diagnosis not present

## 2017-02-01 DIAGNOSIS — I214 Non-ST elevation (NSTEMI) myocardial infarction: Secondary | ICD-10-CM

## 2017-02-01 DIAGNOSIS — E669 Obesity, unspecified: Secondary | ICD-10-CM | POA: Diagnosis not present

## 2017-02-01 DIAGNOSIS — Z79899 Other long term (current) drug therapy: Secondary | ICD-10-CM | POA: Diagnosis not present

## 2017-02-01 NOTE — Progress Notes (Signed)
Daily Session Note  Patient Details  Name: [name] MRN: [id number] Date of Birth: 1933-12-13 Referring Provider:     CARDIAC REHAB PHASE II ORIENTATION from 01/03/2017 in [place]  Referring Provider  Dr. Domenic Polite      Encounter Date: 02/01/2017  Check In:     Session Check In - 02/01/17 1545      Check-In   Location [facility]   Staff Present Diane Angelina Pih, MS, EP, [facility], Exercise Physiologist;Timberly Yott Wynetta Emery, RN, BSN   Supervising physician immediately available to respond to emergencies See telemetry face sheet for immediately available MD   Medication changes reported     No   Fall or balance concerns reported    No   Warm-up and Cool-down Performed as group-led instruction   Resistance Training Performed Yes   VAD Patient? No     Pain Assessment   Currently in Pain? Yes   Pain Score 5    Pain Location Knee   Pain Orientation Left;Right   Pain Descriptors / Indicators Aching;Constant   Pain Type Chronic pain   Pain Onset Other (comment)  Pain is constant.   Pain Frequency Constant   Aggravating Factors  Weight bearing.   Pain Relieving Factors Patient takes Tylenol prn with some relief.    Multiple Pain Sites No      Capillary Blood Glucose: No results found for this or any previous visit (from the past 24 hour(s)).    History  Smoking Status  . Never Smoker  Smokeless Tobacco  . Never Used    Goals Met:  Independence with exercise equipment Exercise tolerated well No report of cardiac concerns or symptoms Strength training completed today  Goals Unmet:  Not Applicable  Comments: Check out 1645.   Dr. Kate Sable is Medical Director for [facility] Cardiac and Pulmonary Rehab.

## 2017-02-04 ENCOUNTER — Encounter (HOSPITAL_COMMUNITY): Payer: Medicare Other

## 2017-02-05 DIAGNOSIS — M1711 Unilateral primary osteoarthritis, right knee: Secondary | ICD-10-CM | POA: Diagnosis not present

## 2017-02-05 DIAGNOSIS — M25561 Pain in right knee: Secondary | ICD-10-CM | POA: Diagnosis not present

## 2017-02-06 ENCOUNTER — Encounter (HOSPITAL_COMMUNITY): Payer: Medicare Other

## 2017-02-08 ENCOUNTER — Encounter (HOSPITAL_COMMUNITY): Payer: Medicare Other

## 2017-02-11 ENCOUNTER — Encounter (HOSPITAL_COMMUNITY)
Admission: RE | Admit: 2017-02-11 | Discharge: 2017-02-11 | Disposition: A | Discharge status: Home / Self Care | Payer: Medicare Other | Source: Ambulatory Visit | Attending: Cardiology | Admitting: Cardiology

## 2017-02-11 DIAGNOSIS — Z79899 Other long term (current) drug therapy: Secondary | ICD-10-CM | POA: Insufficient documentation

## 2017-02-11 DIAGNOSIS — I129 Hypertensive chronic kidney disease with stage 1 through stage 4 chronic kidney disease, or unspecified chronic kidney disease: Secondary | ICD-10-CM | POA: Diagnosis not present

## 2017-02-11 DIAGNOSIS — Z7902 Long term (current) use of antithrombotics/antiplatelets: Secondary | ICD-10-CM | POA: Diagnosis not present

## 2017-02-11 DIAGNOSIS — I2510 Atherosclerotic heart disease of native coronary artery without angina pectoris: Secondary | ICD-10-CM | POA: Insufficient documentation

## 2017-02-11 DIAGNOSIS — Z7951 Long term (current) use of inhaled steroids: Secondary | ICD-10-CM | POA: Insufficient documentation

## 2017-02-11 DIAGNOSIS — M1990 Unspecified osteoarthritis, unspecified site: Secondary | ICD-10-CM | POA: Diagnosis not present

## 2017-02-11 DIAGNOSIS — Z6833 Body mass index (BMI) 33.0-33.9, adult: Secondary | ICD-10-CM | POA: Diagnosis not present

## 2017-02-11 DIAGNOSIS — N183 Chronic kidney disease, stage 3 (moderate): Secondary | ICD-10-CM | POA: Insufficient documentation

## 2017-02-11 DIAGNOSIS — G4730 Sleep apnea, unspecified: Secondary | ICD-10-CM | POA: Insufficient documentation

## 2017-02-11 DIAGNOSIS — E7800 Pure hypercholesterolemia, unspecified: Secondary | ICD-10-CM | POA: Diagnosis not present

## 2017-02-11 DIAGNOSIS — Z955 Presence of coronary angioplasty implant and graft: Secondary | ICD-10-CM | POA: Insufficient documentation

## 2017-02-11 DIAGNOSIS — Z7982 Long term (current) use of aspirin: Secondary | ICD-10-CM | POA: Insufficient documentation

## 2017-02-11 DIAGNOSIS — I252 Old myocardial infarction: Secondary | ICD-10-CM | POA: Insufficient documentation

## 2017-02-11 DIAGNOSIS — E669 Obesity, unspecified: Secondary | ICD-10-CM | POA: Insufficient documentation

## 2017-02-11 NOTE — Progress Notes (Signed)
Daily Session Note  Patient Details  Name: [name] MRN: [id number] Date of Birth: August 29, 1934 Referring Provider:     CARDIAC REHAB PHASE II ORIENTATION from 01/03/2017 in [place]  Referring Provider  Dr. Domenic Polite      Encounter Date: 02/11/2017  Check In:     Session Check In - 02/11/17 1600      Check-In   Location [facility]   Staff Present Monti Villers Angelina Pih, MS, EP, [facility], Exercise Physiologist;Debra Wynetta Emery, RN, BSN   Supervising physician immediately available to respond to emergencies See telemetry face sheet for immediately available MD   Medication changes reported     No   Fall or balance concerns reported    No   Tobacco Cessation No Change   Warm-up and Cool-down Performed as group-led instruction   Resistance Training Performed Yes   VAD Patient? No     Pain Assessment   Currently in Pain? Yes   Pain Score 5    Pain Location Knee   Pain Orientation Right   Pain Descriptors / Indicators Aching;Constant   Pain Type Chronic pain   Pain Frequency Constant   Aggravating Factors  Weight bearing   Pain Relieving Factors Patient takes Tylenol prn with some relief.    Effect of Pain on Daily Activities Limits activity   Multiple Pain Sites No      Capillary Blood Glucose: No results found for this or any previous visit (from the past 24 hour(s)).      Exercise Prescription Changes - 02/11/17 1400      Response to Exercise   Blood Pressure (Admit) 140/62   Blood Pressure (Exercise) 140/60   Blood Pressure (Exit) 122/56   Heart Rate (Admit) 66 bpm   Heart Rate (Exercise) 101 bpm   Heart Rate (Exit) 87 bpm   Rating of Perceived Exertion (Exercise) 11   Duration Progress to 30 minutes of  aerobic without signs/symptoms of physical distress   Intensity THRR unchanged     Progression   Progression Continue to progress workloads to maintain intensity without signs/symptoms of physical distress.     Resistance Training    Training Prescription Yes   Weight 1   Reps 10-15     NuStep   Level 2   SPM 26   Minutes 30   METs 2.1     Home Exercise Plan   Plans to continue exercise at Home (comment)   Frequency Add 2 additional days to program exercise sessions.      History  Smoking Status  . Never Smoker  Smokeless Tobacco  . Never Used    Goals Met:  Independence with exercise equipment Exercise tolerated well No report of cardiac concerns or symptoms Strength training completed today  Goals Unmet:  Not Applicable  Comments: Check out: 4:45   Dr. Kate Sable is Medical Director for [facility] and Pulmonary Rehab.

## 2017-02-12 DIAGNOSIS — M25562 Pain in left knee: Secondary | ICD-10-CM | POA: Diagnosis not present

## 2017-02-12 DIAGNOSIS — M1712 Unilateral primary osteoarthritis, left knee: Secondary | ICD-10-CM | POA: Diagnosis not present

## 2017-02-13 ENCOUNTER — Encounter (HOSPITAL_COMMUNITY)
Admission: RE | Admit: 2017-02-13 | Discharge: 2017-02-13 | Disposition: A | Discharge status: Home / Self Care | Payer: Medicare Other | Source: Ambulatory Visit | Attending: Cardiology | Admitting: Cardiology

## 2017-02-13 DIAGNOSIS — I129 Hypertensive chronic kidney disease with stage 1 through stage 4 chronic kidney disease, or unspecified chronic kidney disease: Secondary | ICD-10-CM | POA: Diagnosis not present

## 2017-02-13 DIAGNOSIS — I214 Non-ST elevation (NSTEMI) myocardial infarction: Secondary | ICD-10-CM

## 2017-02-13 DIAGNOSIS — I2510 Atherosclerotic heart disease of native coronary artery without angina pectoris: Secondary | ICD-10-CM | POA: Diagnosis not present

## 2017-02-13 DIAGNOSIS — Z79899 Other long term (current) drug therapy: Secondary | ICD-10-CM | POA: Diagnosis not present

## 2017-02-13 DIAGNOSIS — E669 Obesity, unspecified: Secondary | ICD-10-CM | POA: Diagnosis not present

## 2017-02-13 DIAGNOSIS — Z955 Presence of coronary angioplasty implant and graft: Secondary | ICD-10-CM

## 2017-02-13 DIAGNOSIS — I252 Old myocardial infarction: Secondary | ICD-10-CM | POA: Diagnosis not present

## 2017-02-13 NOTE — Progress Notes (Deleted)
Daily Session Note  Patient Details  Name: [name] MRN: [id number] Date of Birth: 12/25/1933 Referring Provider:     CARDIAC REHAB PHASE II ORIENTATION from 01/03/2017 in [place]  Referring Provider  Dr. Domenic Polite      Encounter Date: 02/13/2017  Check In:     Session Check In - 02/13/17 1545      Check-In   Location [facility]   Staff Present Diane Angelina Pih, MS, EP, [facility], Exercise Physiologist;Olumide Dolinger Wynetta Emery, RN, BSN   Supervising physician immediately available to respond to emergencies See telemetry face sheet for immediately available MD   Medication changes reported     No   Fall or balance concerns reported    No   Tobacco Cessation No Change   Warm-up and Cool-down Performed as group-led instruction   Resistance Training Performed Yes   VAD Patient? No     Pain Assessment   Currently in Pain? No/denies   Pain Score 0-No pain   Multiple Pain Sites No      Capillary Blood Glucose: No results found for this or any previous visit (from the past 24 hour(s)).    History  Smoking Status  . Never Smoker  Smokeless Tobacco  . Never Used    Goals Met:  Independence with exercise equipment Exercise tolerated well No report of cardiac concerns or symptoms Strength training completed today  Goals Unmet:  Not Applicable  Comments: Check out 1645.   Dr. Kate Sable is Medical Director for [facility] Cardiac and Pulmonary Rehab.

## 2017-02-13 NOTE — Progress Notes (Signed)
Daily Session Note  Patient Details  Name: [name] MRN: [id number] Date of Birth: Apr 29, 1934 Referring Provider:     CARDIAC REHAB PHASE II ORIENTATION from 01/03/2017 in [place]  Referring Provider  Dr. Domenic Polite      Encounter Date: 02/13/2017  Check In:     Session Check In - 02/13/17 1545      Check-In   Location [facility]   Staff Present Diane Angelina Pih, MS, EP, [facility], Exercise Physiologist;Glyn Gerads Wynetta Emery, RN, BSN   Supervising physician immediately available to respond to emergencies See telemetry face sheet for immediately available MD   Medication changes reported     No   Fall or balance concerns reported    No   Tobacco Cessation No Change   Warm-up and Cool-down Performed as group-led instruction   Resistance Training Performed Yes   VAD Patient? No     Pain Assessment   Currently in Pain? Yes   Pain Score 4    Pain Location Knee   Pain Orientation --  Bilateral   Pain Descriptors / Indicators Constant;Aching   Pain Type Chronic pain   Pain Radiating Towards N/A   Pain Onset Other (comment)  Pain is chronic and constant in nature.   Pain Frequency Constant   Aggravating Factors  Weight bearing. Patient has OA in knees bilaterally.   Pain Relieving Factors Patient uses topical ointments with some relief.    Effect of Pain on Daily Activities Decreased mobility.   Multiple Pain Sites No      Capillary Blood Glucose: No results found for this or any previous visit (from the past 24 hour(s)).    History  Smoking Status  . Never Smoker  Smokeless Tobacco  . Never Used    Goals Met:  Independence with exercise equipment Exercise tolerated well No report of cardiac concerns or symptoms Strength training completed today  Goals Unmet:  Not Applicable  Comments: Check out 1645.   Dr. Kate Sable is Medical Director for [facility] Cardiac and Pulmonary Rehab.

## 2017-02-15 ENCOUNTER — Encounter (HOSPITAL_COMMUNITY): Payer: Medicare Other

## 2017-02-15 ENCOUNTER — Other Ambulatory Visit: Payer: Self-pay | Admitting: Cardiology

## 2017-02-15 MED ORDER — AMLODIPINE BESYLATE 5 MG PO TABS: 5.0000 mg | ORAL_TABLET | Freq: Every day | ORAL | 0 refills | Status: DC

## 2017-02-15 NOTE — Telephone Encounter (Signed)
escribed norvasc 5 mg #90 to pharmacy

## 2017-02-15 NOTE — Telephone Encounter (Signed)
° ° ° °  1. Which medications need to be refilled? (please list name of each medication and dose if known) amLODipine (NORVASC) 5 MG tablet [[id number]][[id number]]    2. Which pharmacy/location (including street and city if local pharmacy) is medication to be sent to? [facility] [place]   3. Do they need a 30 day or 90 day supply?  90 DAY

## 2017-02-18 ENCOUNTER — Encounter (HOSPITAL_COMMUNITY)
Admission: RE | Admit: 2017-02-18 | Discharge: 2017-02-18 | Disposition: A | Discharge status: Home / Self Care | Payer: Medicare Other | Source: Ambulatory Visit | Attending: Cardiology | Admitting: Cardiology

## 2017-02-18 DIAGNOSIS — I252 Old myocardial infarction: Secondary | ICD-10-CM | POA: Diagnosis not present

## 2017-02-18 DIAGNOSIS — Z955 Presence of coronary angioplasty implant and graft: Secondary | ICD-10-CM

## 2017-02-18 DIAGNOSIS — E669 Obesity, unspecified: Secondary | ICD-10-CM | POA: Diagnosis not present

## 2017-02-18 DIAGNOSIS — I129 Hypertensive chronic kidney disease with stage 1 through stage 4 chronic kidney disease, or unspecified chronic kidney disease: Secondary | ICD-10-CM | POA: Diagnosis not present

## 2017-02-18 DIAGNOSIS — I214 Non-ST elevation (NSTEMI) myocardial infarction: Secondary | ICD-10-CM

## 2017-02-18 DIAGNOSIS — I2510 Atherosclerotic heart disease of native coronary artery without angina pectoris: Secondary | ICD-10-CM | POA: Diagnosis not present

## 2017-02-18 DIAGNOSIS — Z79899 Other long term (current) drug therapy: Secondary | ICD-10-CM | POA: Diagnosis not present

## 2017-02-18 NOTE — Progress Notes (Signed)
Daily Session Note  Patient Details  Name: [name] MRN: [id number] Date of Birth: [birth year] Referring Provider:     CARDIAC REHAB PHASE II ORIENTATION from 01/03/2017 in [place]  Referring Provider  Dr. Domenic Polite      Encounter Date: 02/18/2017  Check In:     Session Check In - 02/18/17 1545      Check-In   Location [facility]   Staff Present Diane Angelina Pih, MS, EP, [facility], Exercise Physiologist;Shann Lewellyn Wynetta Emery, RN, BSN   Supervising physician immediately available to respond to emergencies See telemetry face sheet for immediately available MD   Medication changes reported     No   Fall or balance concerns reported    No   Tobacco Cessation No Change   Warm-up and Cool-down Performed as group-led instruction   Resistance Training Performed Yes   VAD Patient? No     Pain Assessment   Currently in Pain? Yes   Pain Score 5    Pain Location Knee   Pain Descriptors / Indicators Aching   Pain Type Chronic pain   Pain Radiating Towards N/A   Pain Onset Other (comment)  Pain is constant.   Pain Frequency Constant   Aggravating Factors  Ambulating.    Pain Relieving Factors Patient takes Tylenol.    Effect of Pain on Daily Activities Pain decreases her mobility.       Capillary Blood Glucose: No results found for this or any previous visit (from the past 24 hour(s)).    History  Smoking Status  . Never Smoker  Smokeless Tobacco  . Never Used    Goals Met:  Independence with exercise equipment Exercise tolerated well No report of cardiac concerns or symptoms Strength training completed today  Goals Unmet:  Not Applicable  Comments: Check out 1645.   Dr. Kate Sable is Medical Director for [facility] Cardiac and Pulmonary Rehab.

## 2017-02-19 DIAGNOSIS — M25562 Pain in left knee: Secondary | ICD-10-CM | POA: Diagnosis not present

## 2017-02-19 DIAGNOSIS — M170 Bilateral primary osteoarthritis of knee: Secondary | ICD-10-CM | POA: Diagnosis not present

## 2017-02-19 DIAGNOSIS — M25561 Pain in right knee: Secondary | ICD-10-CM | POA: Diagnosis not present

## 2017-02-20 ENCOUNTER — Encounter (HOSPITAL_COMMUNITY): Payer: Medicare Other

## 2017-02-22 ENCOUNTER — Encounter (HOSPITAL_COMMUNITY)
Admission: RE | Admit: 2017-02-22 | Discharge: 2017-02-22 | Disposition: A | Discharge status: Home / Self Care | Payer: Medicare Other | Source: Ambulatory Visit | Attending: Cardiology | Admitting: Cardiology

## 2017-02-22 DIAGNOSIS — I252 Old myocardial infarction: Secondary | ICD-10-CM | POA: Diagnosis not present

## 2017-02-22 DIAGNOSIS — Z955 Presence of coronary angioplasty implant and graft: Secondary | ICD-10-CM | POA: Diagnosis not present

## 2017-02-22 DIAGNOSIS — E669 Obesity, unspecified: Secondary | ICD-10-CM | POA: Diagnosis not present

## 2017-02-22 DIAGNOSIS — Z79899 Other long term (current) drug therapy: Secondary | ICD-10-CM | POA: Diagnosis not present

## 2017-02-22 DIAGNOSIS — I2510 Atherosclerotic heart disease of native coronary artery without angina pectoris: Secondary | ICD-10-CM | POA: Diagnosis not present

## 2017-02-22 DIAGNOSIS — I129 Hypertensive chronic kidney disease with stage 1 through stage 4 chronic kidney disease, or unspecified chronic kidney disease: Secondary | ICD-10-CM | POA: Diagnosis not present

## 2017-02-22 DIAGNOSIS — I214 Non-ST elevation (NSTEMI) myocardial infarction: Secondary | ICD-10-CM

## 2017-02-22 NOTE — Progress Notes (Signed)
Daily Session Note  Patient Details  Name: [name] MRN: [id number] Date of Birth: 18-Sep-1933 Referring Provider:     CARDIAC REHAB PHASE II ORIENTATION from 01/03/2017 in [place]  Referring Provider  Dr. Domenic Polite      Encounter Date: 02/22/2017  Check In:     Session Check In - 02/22/17 1540      Check-In   Location [facility]   Staff Present Diane Angelina Pih, MS, EP, [facility], Exercise Physiologist;Eurydice Calixto Wynetta Emery, RN, BSN   Supervising physician immediately available to respond to emergencies See telemetry face sheet for immediately available MD   Medication changes reported     No   Fall or balance concerns reported    No   Tobacco Cessation No Change   Warm-up and Cool-down Performed as group-led instruction   Resistance Training Performed Yes   VAD Patient? No     Pain Assessment   Currently in Pain? Yes   Pain Score 6    Pain Location Knee   Pain Orientation Right;Left   Pain Descriptors / Indicators Aching   Pain Type Chronic pain   Pain Radiating Towards No radiation   Pain Onset --  Pain is constant.   Pain Frequency Constant   Aggravating Factors  Ambulation   Pain Relieving Factors Patient takes Tylenol. She had cortisone injections this week.    Effect of Pain on Daily Activities Limits activity.   Multiple Pain Sites No      Capillary Blood Glucose: No results found for this or any previous visit (from the past 24 hour(s)).    History  Smoking Status  . Never Smoker  Smokeless Tobacco  . Never Used    Goals Met:  Independence with exercise equipment Exercise tolerated well No report of cardiac concerns or symptoms Strength training completed today  Goals Unmet:  Not Applicable  Comments: Check out 1645.   Dr. Kate Sable is Medical Director for [facility] Cardiac and Pulmonary Rehab.

## 2017-02-25 ENCOUNTER — Encounter (HOSPITAL_COMMUNITY)
Admission: RE | Admit: 2017-02-25 | Discharge: 2017-02-25 | Disposition: A | Discharge status: Home / Self Care | Payer: Medicare Other | Source: Ambulatory Visit | Attending: Cardiology | Admitting: Cardiology

## 2017-02-25 DIAGNOSIS — I129 Hypertensive chronic kidney disease with stage 1 through stage 4 chronic kidney disease, or unspecified chronic kidney disease: Secondary | ICD-10-CM | POA: Diagnosis not present

## 2017-02-25 DIAGNOSIS — E669 Obesity, unspecified: Secondary | ICD-10-CM | POA: Diagnosis not present

## 2017-02-25 DIAGNOSIS — Z955 Presence of coronary angioplasty implant and graft: Secondary | ICD-10-CM

## 2017-02-25 DIAGNOSIS — I252 Old myocardial infarction: Secondary | ICD-10-CM | POA: Diagnosis not present

## 2017-02-25 DIAGNOSIS — Z79899 Other long term (current) drug therapy: Secondary | ICD-10-CM | POA: Diagnosis not present

## 2017-02-25 DIAGNOSIS — I214 Non-ST elevation (NSTEMI) myocardial infarction: Secondary | ICD-10-CM

## 2017-02-25 DIAGNOSIS — I2510 Atherosclerotic heart disease of native coronary artery without angina pectoris: Secondary | ICD-10-CM | POA: Diagnosis not present

## 2017-02-25 NOTE — Progress Notes (Addendum)
Daily Session Note  Patient Details  Name: [name] MRN: [id number] Date of Birth: 29-Nov-1933 Referring Provider:     CARDIAC REHAB PHASE II ORIENTATION from 01/03/2017 in [place]  Referring Provider  Dr. Domenic Polite      Encounter Date: 02/25/2017  Check In:     Session Check In - 02/25/17 1545      Check-In   Location [facility]   Staff Present Diane Angelina Pih, MS, EP, [facility], Exercise Physiologist;Ashden Sonnenberg Wynetta Emery, RN, BSN   Supervising physician immediately available to respond to emergencies See telemetry face sheet for immediately available MD   Medication changes reported     No   Fall or balance concerns reported    No   Tobacco Cessation No Change   Warm-up and Cool-down Performed as group-led instruction   Resistance Training Performed Yes   VAD Patient? No     Pain Assessment   Currently in Pain? Yes   Pain Score 4    Pain Location Knee   Pain Orientation Right;Left   Pain Descriptors / Indicators Aching;Constant   Pain Type Chronic pain  Pain is constant.   Pain Radiating Towards No radiation.   Pain Onset Other (comment)  Pain is chronic.   Pain Frequency Constant   Aggravating Factors  Weight bearing.   Pain Relieving Factors Patient uses bio freeze and tylenol.    Effect of Pain on Daily Activities Pain decreases her ADL's.    Multiple Pain Sites No      Capillary Blood Glucose: No results found for this or any previous visit (from the past 24 hour(s)).    History  Smoking Status  . Never Smoker  Smokeless Tobacco  . Never Used    Goals Met:  Independence with exercise equipment Exercise tolerated well No report of cardiac concerns or symptoms Strength training completed today  Goals Unmet:  Not Applicable  Comments: Check out 1645.   Dr. Kate Sable is Medical Director for [facility] Cardiac and Pulmonary Rehab.

## 2017-02-27 ENCOUNTER — Encounter (HOSPITAL_COMMUNITY)
Admission: RE | Admit: 2017-02-27 | Discharge: 2017-02-27 | Disposition: A | Discharge status: Home / Self Care | Payer: Medicare Other | Source: Ambulatory Visit | Attending: Cardiology | Admitting: Cardiology

## 2017-02-27 DIAGNOSIS — Z79899 Other long term (current) drug therapy: Secondary | ICD-10-CM | POA: Diagnosis not present

## 2017-02-27 DIAGNOSIS — I252 Old myocardial infarction: Secondary | ICD-10-CM | POA: Diagnosis not present

## 2017-02-27 DIAGNOSIS — I2510 Atherosclerotic heart disease of native coronary artery without angina pectoris: Secondary | ICD-10-CM | POA: Diagnosis not present

## 2017-02-27 DIAGNOSIS — E669 Obesity, unspecified: Secondary | ICD-10-CM | POA: Diagnosis not present

## 2017-02-27 DIAGNOSIS — Z955 Presence of coronary angioplasty implant and graft: Secondary | ICD-10-CM

## 2017-02-27 DIAGNOSIS — I129 Hypertensive chronic kidney disease with stage 1 through stage 4 chronic kidney disease, or unspecified chronic kidney disease: Secondary | ICD-10-CM | POA: Diagnosis not present

## 2017-02-27 DIAGNOSIS — I214 Non-ST elevation (NSTEMI) myocardial infarction: Secondary | ICD-10-CM

## 2017-02-27 NOTE — Progress Notes (Signed)
Daily Session Note  Patient Details  Name: [name] MRN: [id number] Date of Birth: 1933-12-10 Referring Provider:     CARDIAC REHAB PHASE II ORIENTATION from 01/03/2017 in [place]  Referring Provider  Dr. Domenic Polite      Encounter Date: 02/27/2017  Check In:     Session Check In - 02/27/17 1545      Check-In   Location [facility]   Staff Present Diane Angelina Pih, MS, EP, [facility], Exercise Physiologist;Kadeen Sroka Wynetta Emery, RN, BSN   Supervising physician immediately available to respond to emergencies See telemetry face sheet for immediately available MD   Medication changes reported     No   Fall or balance concerns reported    No   Tobacco Cessation No Change   Warm-up and Cool-down Performed as group-led instruction   Resistance Training Performed Yes   VAD Patient? No     Pain Assessment   Currently in Pain? Yes   Pain Score 4    Pain Location Knee   Pain Orientation Right;Left   Pain Descriptors / Indicators Aching   Pain Type Chronic pain   Pain Radiating Towards No radiation   Pain Onset More than a month ago   Pain Frequency Constant   Aggravating Factors  Weigth bearing.    Pain Relieving Factors Uses bio freeze topical spray.   Effect of Pain on Daily Activities Limits ADL.s   Multiple Pain Sites No      Capillary Blood Glucose: No results found for this or any previous visit (from the past 24 hour(s)).    History  Smoking Status  . Never Smoker  Smokeless Tobacco  . Never Used    Goals Met:  Independence with exercise equipment Exercise tolerated well No report of cardiac concerns or symptoms Strength training completed today  Goals Unmet:  Not Applicable  Comments: Check out 1645.   Dr. Kate Sable is Medical Director for [facility] Cardiac and Pulmonary Rehab.

## 2017-02-27 NOTE — Progress Notes (Signed)
Cardiac Individual Treatment Plan  Patient Details  Name: [name] MRN: [id number] Date of Birth: 31-Jul-1934 Referring Provider:     CARDIAC REHAB PHASE II ORIENTATION from 01/03/2017 in [facility]  Referring Provider  Dr. Diona Browner      Initial Encounter Date:    CARDIAC REHAB PHASE II ORIENTATION from 01/03/2017 in [place] CARDIAC REHABILITATION  Date  01/03/17  Referring Provider  Dr. Diona Browner      Visit Diagnosis: NSTEMI (non-ST elevated myocardial infarction) [facility])  Status post coronary artery stent placement  Patient's Home Medications on Admission:  Current Outpatient Prescriptions:  .  acetaminophen (TYLENOL) 325 MG tablet, Take 325-650 mg by mouth every 6 (six) hours as needed (for headaches)., Disp: , Rfl:  .  amLODipine (NORVASC) 5 MG tablet, Take 1 tablet (5 mg total) by mouth daily., Disp: 90 tablet, Rfl: 0 .  aspirin 81 MG tablet, Take 81 mg by mouth daily., Disp: , Rfl:  .  fluticasone (FLONASE) 50 MCG/ACT nasal spray, Place 1 spray into both nostrils as needed for allergies or rhinitis. , Disp: , Rfl:  .  furosemide (LASIX) 40 MG tablet, TAKE 1 TABLET BY MOUTH DAILY AS NEEDED FOR EDEMA, Disp: 30 tablet, Rfl: 0 .  hydrALAZINE (APRESOLINE) 100 MG tablet, Take 0.5 tablets (50 mg total) by mouth 3 (three) times daily., Disp: 45 tablet, Rfl: 6 .  Iron Succinyl-Protein Complex 40 MG/15ML SOLN, Take 40 mg by mouth daily., Disp: , Rfl:  .  losartan (COZAAR) 50 MG tablet, Take 1 tablet (50 mg total) by mouth daily., Disp: 30 tablet, Rfl: 5 .  nitroGLYCERIN (NITROSTAT) 0.4 MG SL tablet, Place 1 tablet (0.4 mg total) under the tongue every 5 (five) minutes as needed., Disp: 25 tablet, Rfl: 3 .  pantoprazole (PROTONIX) 40 MG tablet, Take 1 tablet (40 mg total) by mouth daily., Disp: 30 tablet, Rfl: 6 .  rosuvastatin (CRESTOR) 40 MG tablet, Take 1 tablet (40 mg total) by mouth daily., Disp: 90 tablet, Rfl: 3 .  ticagrelor (BRILINTA) 90 MG TABS  tablet, Take 1 tablet (90 mg total) by mouth 2 (two) times daily., Disp: 180 tablet, Rfl: 3 .  Vitamin D, Ergocalciferol, (DRISDOL) 50000 units CAPS capsule, Take 50,000 Units by mouth every 30 (thirty) days., Disp: , Rfl: 0  Past Medical History: Past Medical History:  Diagnosis Date  . Arthritis   . CKD (chronic kidney disease), stage III   . Complete heart block (HCC) 11/16/2016   a. transient during NSTEMI, resolved with revascularization.  . Coronary artery disease 11/2009   a. with prior stent placement to the ramus intermedius and RCA. b. NSTEMI 11/2016 s/p DES to DES to distal Cx with moderate residual dz.  . High cholesterol   . Hypertension   . Non-ST elevation (NSTEMI) myocardial infarction (HCC) 11/16/2016  . Obesity   . Reflux esophagitis   . Sleep apnea 09/27/2010   Untreated, REM 64.7/hr AHI 18.5/hr RDI 19.2/hr. Patuent refused CPAP therapy    Tobacco Use: History  Smoking Status  . Never Smoker  Smokeless Tobacco  . Never Used    Labs: Recent Review Flowsheet Data    Labs for ITP Cardiac and Pulmonary Rehab Latest Ref Rng & Units 03/02/2014 11/16/2014 01/19/2015 10/20/2015 11/17/2016   Cholestrol 0 - 200 mg/dL 782 956(O) 130 - 865(H)   LDLCALC 0 - 99 mg/dL 98 846(N) 77 - 629(B)   HDL >40 mg/dL 56 44 48 - 28(U)   Trlycerides <150 mg/dL  87 168(H) 104 - 89   Hemoglobin A1c <5.7 % 6.0(H) - - - -   TCO2 0 - 100 mmol/L - - - 23 -      Capillary Blood Glucose: Lab Results  Component Value Date   GLUCAP 163 (H) 07/13/2014   GLUCAP 110 (H) 07/12/2014   GLUCAP 110 (H) 07/11/2014     Exercise Target Goals:    Exercise Program Goal: Individual exercise prescription set with THRR, safety & activity barriers. Participant demonstrates ability to understand and report RPE using BORG scale, to self-measure pulse accurately, and to acknowledge the importance of the exercise prescription.  Exercise Prescription Goal: Starting with aerobic activity 30 plus minutes a day, 3  days per week for initial exercise prescription. Provide home exercise prescription and guidelines that participant acknowledges understanding prior to discharge.  Activity Barriers & Risk Stratification:   6 Minute Walk:   Oxygen Initial Assessment:   Oxygen Re-Evaluation:   Oxygen Discharge (Final Oxygen Re-Evaluation):   Initial Exercise Prescription:     Initial Exercise Prescription - 01/03/17 1100      Date of Initial Exercise RX and Referring Provider   Date 01/03/17   Referring Provider Dr. Diona Browner     NuStep   Level 2   SPM 10   Minutes 15   METs 1.8     Arm Ergometer   Level 1.5   Watts 11   RPM 11   Minutes 20   METs 1.8     Prescription Details   Frequency (times per week) 3   Duration Progress to 30 minutes of continuous aerobic without signs/symptoms of physical distress     Intensity   THRR 40-80% of Max Heartrate 106-118-129   Ratings of Perceived Exertion 11-13   Perceived Dyspnea 0-4     Progression   Progression Continue progressive overload as per policy without signs/symptoms or physical distress.     Resistance Training   Training Prescription Yes   Weight 1   Reps 10-15      Perform Capillary Blood Glucose checks as needed.  Exercise Prescription Changes:      Exercise Prescription Changes    Row Name 01/28/17 1400 02/11/17 1400 02/21/17 1500         Response to Exercise   Blood Pressure (Admit) 130/70 140/62 120/60     Blood Pressure (Exercise) 144/66 140/60 142/60     Blood Pressure (Exit) 122/60 122/56 118/60     Heart Rate (Admit) 77 bpm 66 bpm 75 bpm     Heart Rate (Exercise) 103 bpm 101 bpm 93 bpm     Heart Rate (Exit) 79 bpm 87 bpm 84 bpm     Rating of Perceived Exertion (Exercise) 11 11 10      Duration Progress to 30 minutes of  aerobic without signs/symptoms of physical distress Progress to 30 minutes of  aerobic without signs/symptoms of physical distress Progress to 30 minutes of  aerobic without  signs/symptoms of physical distress     Intensity THRR unchanged THRR unchanged THRR unchanged       Progression   Progression Continue to progress workloads to maintain intensity without signs/symptoms of physical distress. Continue to progress workloads to maintain intensity without signs/symptoms of physical distress. Continue to progress workloads to maintain intensity without signs/symptoms of physical distress.       Resistance Training   Training Prescription Yes Yes Yes     Weight 1 1 1      Reps 10-15 10-15 10-15  NuStep   Level 2 2 2      SPM 33 26 26     Minutes 30 30 30      METs 3.58 2.1 2.1       Home Exercise Plan   Plans to continue exercise at Home (comment) Home (comment) Home (comment)     Frequency Add 2 additional days to program exercise sessions. Add 2 additional days to program exercise sessions. Add 2 additional days to program exercise sessions.        Exercise Comments:      Exercise Comments    Row Name 01/28/17 1442 02/11/17 1441 02/21/17 1525       Exercise Comments Patient was moved to only the nustep due to back and shoulder issues with the arm crank. Her level has remained the same but her watts have really increased.  Patient has not progressed due to health concerns such as joint pains.  Patient hasn ot increased in intensity due to higher BP.         Exercise Goals and Review:      Exercise Goals    Row Name 01/03/17 1224             Exercise Goals   Intervention Provide advice, education, support and counseling about physical activity/exercise needs.;Develop an individualized exercise prescription for aerobic and resistive training based on initial evaluation findings, risk stratification, comorbidities and participant's personal goals.       Expected Outcomes Achievement of increased cardiorespiratory fitness and enhanced flexibility, muscular endurance and strength shown through measurements of functional capacity and personal  statement of participant.       Increase Strength and Stamina Yes       Intervention Provide advice, education, support and counseling about physical activity/exercise needs.;Develop an individualized exercise prescription for aerobic and resistive training based on initial evaluation findings, risk stratification, comorbidities and participant's personal goals.       Expected Outcomes Achievement of increased cardiorespiratory fitness and enhanced flexibility, muscular endurance and strength shown through measurements of functional capacity and personal statement of participant.          Exercise Goals Re-Evaluation :     Exercise Goals Re-Evaluation    Row Name 01/31/17 0801 02/27/17 1439           Exercise Goal Re-Evaluation   Exercise Goals Review Increase Physical Activity;Increase Strenth and Stamina Increase Physical Activity;Increase Strenth and Stamina      Comments After 9 sessions, patient states she feels stronger and believes the program is benefiting her. Her chronic knee pain impedes her progress but she is doing well.  Patient is not able to progress d/t bilateral knee pain. She says she feels stronger and hopes to continue to gain strength.       Expected Outcomes Patient will complete the program with increased strength, stamina, and activity. Patient will be able to complete the program with increased strength, stamina, and activity.          Discharge Exercise Prescription (Final Exercise Prescription Changes):     Exercise Prescription Changes - 02/21/17 1500      Response to Exercise   Blood Pressure (Admit) 120/60   Blood Pressure (Exercise) 142/60   Blood Pressure (Exit) 118/60   Heart Rate (Admit) 75 bpm   Heart Rate (Exercise) 93 bpm   Heart Rate (Exit) 84 bpm   Rating of Perceived Exertion (Exercise) 10   Duration Progress to 30 minutes of  aerobic without signs/symptoms of physical distress  Intensity THRR unchanged     Progression   Progression  Continue to progress workloads to maintain intensity without signs/symptoms of physical distress.     Resistance Training   Training Prescription Yes   Weight 1   Reps 10-15     NuStep   Level 2   SPM 26   Minutes 30   METs 2.1     Home Exercise Plan   Plans to continue exercise at Home (comment)   Frequency Add 2 additional days to program exercise sessions.      Nutrition:  Target Goals: Understanding of nutrition guidelines, daily intake of sodium 1500mg , cholesterol 200mg , calories 30% from fat and 7% or less from saturated fats, daily to have 5 or more servings of fruits and vegetables.  Biometrics:     Pre Biometrics - 01/03/17 1157      Pre Biometrics   Height 5\' 2"  (1.575 m)   Waist Circumference 42 inches   Hip Circumference 42 inches   Waist to Hip Ratio 1 %   BMI (Calculated) 33.9   Triceps Skinfold 14 mm   % Body Fat 43.5 %   Grip Strength --  Machine Broken   Flexibility 0 in   Single Leg Stand 0 seconds       Nutrition Therapy Plan and Nutrition Goals:   Nutrition Discharge: Rate Your Plate Scores:   Nutrition Goals Re-Evaluation:   Nutrition Goals Discharge (Final Nutrition Goals Re-Evaluation):   Psychosocial: Target Goals: Acknowledge presence or absence of significant depression and/or stress, maximize coping skills, provide positive support system. Participant is able to verbalize types and ability to use techniques and skills needed for reducing stress and depression.  Initial Review & Psychosocial Screening:     Initial Psych Review & Screening - 01/03/17 1228      Initial Review   Current issues with None Identified     Family Dynamics   Good Support System? Yes     Barriers   Psychosocial barriers to participate in program There are no identifiable barriers or psychosocial needs.  QOL score 24.89     Screening Interventions   Interventions Encouraged to exercise      Quality of Life Scores:     Quality of Life -  01/03/17 1158      Quality of Life Scores   Health/Function Pre 21.93 %   Socioeconomic Pre 24.17 %   Psych/Spiritual Pre 30 %   Family Pre 27.5 %   GLOBAL Pre 24.89 %      PHQ-9: Recent Review Flowsheet Data    Dep[facility] 01/03/2017   Decreased Interest 0   Down, Depressed, Hopeless 0   PHQ - 2 Score 0   Altered sleeping 0   Tired, decreased energy 1   Change in appetite 1   Feeling bad or failure about yourself  0   Trouble concentrating 0   Moving slowly or fidgety/restless 0   Suicidal thoughts 0   PHQ-9 Score 2   Difficult doing work/chores Not difficult at all     Interpretation of Total Score  Total Score Depression Severity:  1-4 = Minimal depression, 5-9 = Mild depression, 10-14 = Moderate depression, 15-19 = Moderately severe depression, 20-27 = Severe depression   Psychosocial Evaluation and Intervention:     Psychosocial Evaluation - 01/03/17 1228      Psychosocial Evaluation & Interventions   Interventions Encouraged to exercise with the program and follow exercise prescription   Continue Psychosocial  Services  No Follow up required      Psychosocial Re-Evaluation:     Psychosocial Re-Evaluation    Row Name 01/31/17 0802 02/27/17 1440           Psychosocial Re-Evaluation   Current issues with None Identified None Identified      Expected Outcomes Patient will have no psychosocial barriers identified at discharge.  Patient will have no psychosocial issues idenified at discharge.       Interventions Encouraged to attend Cardiac Rehabilitation for the exercise Encouraged to attend Cardiac Rehabilitation for the exercise      Continue Psychosocial Services  No Follow up required No Follow up required         Psychosocial Discharge (Final Psychosocial Re-Evaluation):     Psychosocial Re-Evaluation - 02/27/17 1440      Psychosocial Re-Evaluation   Current issues with None Identified   Expected Outcomes Patient will have no  psychosocial issues idenified at discharge.    Interventions Encouraged to attend Cardiac Rehabilitation for the exercise   Continue Psychosocial Services  No Follow up required      Vocational Rehabilitation: Provide vocational rehab assistance to qualifying candidates.   Vocational Rehab Evaluation & Intervention:     Vocational Rehab - 01/03/17 1220      Initial Vocational Rehab Evaluation & Intervention   Assessment shows need for Vocational Rehabilitation No      Education: Education Goals: Education classes will be provided on a weekly basis, covering required topics. Participant will state understanding/return demonstration of topics presented.  Learning Barriers/Preferences:     Learning Barriers/Preferences - 01/03/17 1220      Learning Barriers/Preferences   Learning Barriers None   Learning Preferences Pictoral;Video      Education Topics: Hypertension, Hypertension Reduction -Define heart disease and high blood pressure. Discus how high blood pressure affects the body and ways to reduce high blood pressure.   CARDIAC REHAB PHASE II EXERCISE from 02/13/2017 in [place] CARDIAC REHABILITATION  Date  01/09/17  Educator  Hart Rochester  Instruction Review Code  2- meets goals/outcomes      Exercise and Your Heart -Discuss why it is important to exercise, the FITT principles of exercise, normal and abnormal responses to exercise, and how to exercise safely.   CARDIAC REHAB PHASE II EXERCISE from 02/13/2017 in [place] [facility]AC REH[facility]IT[facility]  Date  01/17/17  Educator  DC  Instruction Review Code  2- meets goals/outcomes      Angina -Discuss definition of angina, causes of angina, treatment of angina, and how to decrease risk of having angina.   Cardiac Medications -Review what the following cardiac medications are used for, how they affect the body, and side effects that may occur when taking the medications.  Medications include Aspirin, Beta blockers,  calcium channel blockers, ACE Inhibitors, angiotensin receptor blockers, diuretics, digoxin, and antihyperlipidemics.   CARDIAC REHAB PHASE II EXERCISE from 02/13/2017 in [place] [facility]IAC REH[facility]IT[facility]  Date  01/30/17  Educator  DC  Instruction Review Code  2- meets goals/outcomes      Congestive Heart Failure -Discuss the definition of CHF, how to live with CHF, the signs and symptoms of CHF, and how keep track of weight and sodium intake.   Heart Disease and Intimacy -Discus the effect sexual activity has on the heart, how changes occur during intimacy as we age, and safety during sexual activity.   CARDIAC REHAB PHASE II EXERCISE from 02/13/2017 in [place] [facility]IAC RE[facility]IT[facility]  Date  02/13/17  Educator  DJ  Instruction Review Code  2- meets goals/outcomes      Smoking Cessation / COPD -Discuss different methods to quit smoking, the health benefits of quitting smoking, and the definition of COPD.   Nutrition I: Fats -Discuss the types of cholesterol, what cholesterol does to the heart, and how cholesterol levels can be controlled.   Nutrition II: Labels -Discuss the different components of food labels and how to read food label   Heart Parts and Heart Disease -Discuss the anatomy of the heart, the pathway of blood circulation through the heart, and these are affected by heart disease.   Stress I: Signs and Symptoms -Discuss the causes of stress, how stress may lead to anxiety and depression, and ways to limit stress.   Stress II: Relaxation -Discuss different types of relaxation techniques to limit stress.   Warning Signs of Stroke / TIA -Discuss definition of a stroke, what the signs and symptoms are of a stroke, and how to identify when someone is having stroke.   Knowledge Questionnaire Score:     Knowledge Questionnaire Score - 01/03/17 1220      Knowledge Questionnaire Score   Pre Score 16/24      Core Components/Risk Factors/Patient Goals at  Admission:     Personal Goals and Risk Factors at Admission - 01/03/17 1225      Core Components/Risk Factors/Patient Goals on Admission    Weight Management Weight Maintenance   Lipids Yes   Intervention Provide education and support for participant on nutrition & aerobic/resistive exercise along with prescribed medications to achieve LDL 70mg , HDL >40mg .   Expected Outcomes Short Term: Participant states understanding of desired cholesterol values and is compliant with medications prescribed. Participant is following exercise prescription and nutrition guidelines.;Long Term: Cholesterol controlled with medications as prescribed, with individualized exercise RX and with personalized nutrition plan. Value goals: LDL < 70mg , HDL > 40 mg.   Personal Goal Other Yes   Personal Goal Become stronger, Do my activities and not give out of breath   Intervention Attend CR 3 x week and supplement exercise at home 2 x week.    Expected Outcomes Reach personal goals.       Core Components/Risk Factors/Patient Goals Review:      Goals and Risk Factor Review    Row Name 01/03/17 1227 01/31/17 0758 02/27/17 1436         Core Components/Risk Factors/Patient Goals Review   Personal Goals Review Weight Management/Obesity;Improve shortness of breath with ADL's Weight Management/Obesity;Improve shortness of breath with ADL's Weight Management/Obesity;Improve shortness of breath with ADL's  Become stronger; not get SOB     Review  - Patient has completed 9 sessions maintaining her weight. She has chronic OA in her knees with chronic constant pain. She says her legs already feel stronger with the 9 sessions. She continues to have SOB with exerction. Her gait is very unstable using a cane. She also has a walker she uses at home. Will continue to monitor for progress.  Patient has completed 15 sessions gaining 3 lbs. She continues to have bilateral knee pain secondary to OA. She has had injections of cortisone  since her last ITP with no change in her pain level. She is not able to progress and is only able to do the NuStep d/t pain and shoulder issues. She says her legs continue to feels stronger. She continues to ambulate using a cane. She says the program is helping her and she wants to continue.  Will continue to monitor.      Expected Outcomes  - Patient will continue to attend sessions meeting her personal goals.  Patient will be able to complete the program.        Core Components/Risk Factors/Patient Goals at Discharge (Final Review):      Goals and Risk Factor Review - 02/27/17 1436      Core Components/Risk Factors/Patient Goals Review   Personal Goals Review Weight Management/Obesity;Improve shortness of breath with ADL's  Become stronger; not get SOB   Review Patient has completed 15 sessions gaining 3 lbs. She continues to have bilateral knee pain secondary to OA. She has had injections of cortisone since her last ITP with no change in her pain level. She is not able to progress and is only able to do the NuStep d/t pain and shoulder issues. She says her legs continue to feels stronger. She continues to ambulate using a cane. She says the program is helping her and she wants to continue. Will continue to monitor.    Expected Outcomes Patient will be able to complete the program.      ITP Comments:     ITP Comments    Row Name 01/03/17 1622           ITP Comments Patient new to program. Plans to start Monday 01/07/17.          Comments: ITP 30 Day REVIEW Patient doing well in the program. Will continue to monitor for progress.

## 2017-02-28 DIAGNOSIS — Z79899 Other long term (current) drug therapy: Secondary | ICD-10-CM | POA: Diagnosis not present

## 2017-02-28 DIAGNOSIS — E782 Mixed hyperlipidemia: Secondary | ICD-10-CM | POA: Diagnosis not present

## 2017-02-28 LAB — HEPATIC FUNCTION PANEL
ALT: 10 U/L (ref 6–29)
AST: 17 U/L (ref 10–35)
Albumin: 3.5 g/dL — ABNORMAL LOW (ref 3.6–5.1)
Alkaline Phosphatase: 118 U/L (ref 33–130)
Bilirubin, Direct: 0.1 mg/dL (ref <=0.2)
Indirect Bilirubin: 0.3 mg/dL (ref 0.2–1.2)
Total Bilirubin: 0.4 mg/dL (ref 0.2–1.2)
Total Protein: 6.4 g/dL (ref 6.1–8.1)

## 2017-02-28 LAB — LIPID PANEL
Cholesterol: 116 mg/dL (ref <200)
HDL: 40 mg/dL — ABNORMAL LOW (ref >50)
LDL Cholesterol: 47 mg/dL (ref <100)
Total CHOL/HDL Ratio: 2.9 Ratio (ref <5.0)
Triglycerides: 143 mg/dL (ref <150)
VLDL: 29 mg/dL (ref <30)

## 2017-03-01 ENCOUNTER — Encounter (HOSPITAL_COMMUNITY)
Admission: RE | Admit: 2017-03-01 | Discharge: 2017-03-01 | Disposition: A | Discharge status: Home / Self Care | Payer: Medicare Other | Source: Ambulatory Visit | Attending: Cardiology | Admitting: Cardiology

## 2017-03-01 DIAGNOSIS — I252 Old myocardial infarction: Secondary | ICD-10-CM | POA: Diagnosis not present

## 2017-03-01 DIAGNOSIS — I129 Hypertensive chronic kidney disease with stage 1 through stage 4 chronic kidney disease, or unspecified chronic kidney disease: Secondary | ICD-10-CM | POA: Diagnosis not present

## 2017-03-01 DIAGNOSIS — I2510 Atherosclerotic heart disease of native coronary artery without angina pectoris: Secondary | ICD-10-CM | POA: Diagnosis not present

## 2017-03-01 DIAGNOSIS — Z79899 Other long term (current) drug therapy: Secondary | ICD-10-CM | POA: Diagnosis not present

## 2017-03-01 DIAGNOSIS — E669 Obesity, unspecified: Secondary | ICD-10-CM | POA: Diagnosis not present

## 2017-03-01 DIAGNOSIS — Z955 Presence of coronary angioplasty implant and graft: Secondary | ICD-10-CM | POA: Diagnosis not present

## 2017-03-01 NOTE — Progress Notes (Signed)
Daily Session Note  Patient Details  Name: KALAYA INFANTINO MRN: [id number] Date of Birth: Jan 05, 1934 Referring Provider:     CARDIAC REHAB PHASE II ORIENTATION from 01/03/2017 in [place]  Referring Provider  Dr. Domenic Polite      Encounter Date: 03/01/2017  Check In:     Session Check In - 03/01/17 1545      Check-In   Location [facility]   Staff Present Meloney Feld Angelina Pih, MS, EP, [facility], Exercise Physiologist;Debra Wynetta Emery, RN, BSN   Supervising physician immediately available to respond to emergencies See telemetry face sheet for immediately available MD   Medication changes reported     No   Fall or balance concerns reported    No   Tobacco Cessation No Change   Warm-up and Cool-down Performed as group-led instruction   Resistance Training Performed Yes   VAD Patient? No     Pain Assessment   Pain Score 5    Pain Location Knee   Pain Descriptors / Indicators Aching   Pain Type Chronic pain   Pain Radiating Towards No   Pain Onset More than a month ago   Pain Frequency Constant   Aggravating Factors  Weight bearing   Pain Relieving Factors Uses bio freeze topical spray   Multiple Pain Sites No      Capillary Blood Glucose: No results found for this or any previous visit (from the past 24 hour(s)).    History  Smoking Status  . Never Smoker  Smokeless Tobacco  . Never Used    Goals Met:  Independence with exercise equipment Exercise tolerated well No report of cardiac concerns or symptoms Strength training completed today  Goals Unmet:  Not Applicable  Comments: Check out: [place]   Dr. Kate Sable is Medical Director for [place] and Pulmonary Rehab.

## 2017-03-04 ENCOUNTER — Encounter (HOSPITAL_COMMUNITY)
Admission: RE | Admit: 2017-03-04 | Discharge: 2017-03-04 | Disposition: A | Discharge status: Home / Self Care | Payer: Medicare Other | Source: Ambulatory Visit | Attending: Cardiology | Admitting: Cardiology

## 2017-03-04 DIAGNOSIS — I252 Old myocardial infarction: Secondary | ICD-10-CM | POA: Diagnosis not present

## 2017-03-04 DIAGNOSIS — Z79899 Other long term (current) drug therapy: Secondary | ICD-10-CM | POA: Diagnosis not present

## 2017-03-04 DIAGNOSIS — I129 Hypertensive chronic kidney disease with stage 1 through stage 4 chronic kidney disease, or unspecified chronic kidney disease: Secondary | ICD-10-CM | POA: Diagnosis not present

## 2017-03-04 DIAGNOSIS — I2510 Atherosclerotic heart disease of native coronary artery without angina pectoris: Secondary | ICD-10-CM | POA: Diagnosis not present

## 2017-03-04 DIAGNOSIS — I214 Non-ST elevation (NSTEMI) myocardial infarction: Secondary | ICD-10-CM

## 2017-03-04 DIAGNOSIS — Z955 Presence of coronary angioplasty implant and graft: Secondary | ICD-10-CM | POA: Diagnosis not present

## 2017-03-04 DIAGNOSIS — E669 Obesity, unspecified: Secondary | ICD-10-CM | POA: Diagnosis not present

## 2017-03-04 NOTE — Progress Notes (Signed)
Daily Session Note  Patient Details  Name: BHAVYA ESCHETE MRN: [id number] Date of Birth: 1933-11-09 Referring Provider:     CARDIAC REHAB PHASE II ORIENTATION from 01/03/2017 in [place]  Referring Provider  Dr. Domenic Polite      Encounter Date: 03/04/2017  Check In:     Session Check In - 03/04/17 1549      Check-In   Location [facility]   Staff Present Aundra Dubin, RN, BSN;Kamarii Carton Luther Parody, BS, EP, Exercise Physiologist   Supervising physician immediately available to respond to emergencies See telemetry face sheet for immediately available MD   Medication changes reported     No   Fall or balance concerns reported    No   Warm-up and Cool-down Performed as group-led instruction   Resistance Training Performed Yes   VAD Patient? No     Pain Assessment   Currently in Pain? No/denies   Pain Score 0-No pain   Multiple Pain Sites No      Capillary Blood Glucose: No results found for this or any previous visit (from the past 24 hour(s)).    History  Smoking Status  . Never Smoker  Smokeless Tobacco  . Never Used    Goals Met:  Independence with exercise equipment Exercise tolerated well No report of cardiac concerns or symptoms Strength training completed today  Goals Unmet:  Not Applicable  Comments: Check out 445   Dr. Kate Sable is Medical Director for [place] and Pulmonary Rehab.

## 2017-03-05 ENCOUNTER — Telehealth: Payer: Self-pay | Admitting: Physician Assistant

## 2017-03-05 DIAGNOSIS — N183 Chronic kidney disease, stage 3 (moderate): Secondary | ICD-10-CM | POA: Diagnosis not present

## 2017-03-05 DIAGNOSIS — E559 Vitamin D deficiency, unspecified: Secondary | ICD-10-CM | POA: Diagnosis not present

## 2017-03-05 DIAGNOSIS — I129 Hypertensive chronic kidney disease with stage 1 through stage 4 chronic kidney disease, or unspecified chronic kidney disease: Secondary | ICD-10-CM | POA: Diagnosis not present

## 2017-03-05 DIAGNOSIS — N2581 Secondary hyperparathyroidism of renal origin: Secondary | ICD-10-CM | POA: Diagnosis not present

## 2017-03-05 NOTE — Telephone Encounter (Signed)
Follow Up: ° ° ° °Returning your call, concerning her lab results. °

## 2017-03-06 ENCOUNTER — Encounter (HOSPITAL_COMMUNITY)
Admission: RE | Admit: 2017-03-06 | Discharge: 2017-03-06 | Disposition: A | Discharge status: Home / Self Care | Payer: Medicare Other | Source: Ambulatory Visit | Attending: Cardiology | Admitting: Cardiology

## 2017-03-06 DIAGNOSIS — Z79899 Other long term (current) drug therapy: Secondary | ICD-10-CM | POA: Diagnosis not present

## 2017-03-06 DIAGNOSIS — I214 Non-ST elevation (NSTEMI) myocardial infarction: Secondary | ICD-10-CM

## 2017-03-06 DIAGNOSIS — I252 Old myocardial infarction: Secondary | ICD-10-CM | POA: Diagnosis not present

## 2017-03-06 DIAGNOSIS — Z955 Presence of coronary angioplasty implant and graft: Secondary | ICD-10-CM | POA: Diagnosis not present

## 2017-03-06 DIAGNOSIS — I129 Hypertensive chronic kidney disease with stage 1 through stage 4 chronic kidney disease, or unspecified chronic kidney disease: Secondary | ICD-10-CM | POA: Diagnosis not present

## 2017-03-06 DIAGNOSIS — E669 Obesity, unspecified: Secondary | ICD-10-CM | POA: Diagnosis not present

## 2017-03-06 DIAGNOSIS — I2510 Atherosclerotic heart disease of native coronary artery without angina pectoris: Secondary | ICD-10-CM | POA: Diagnosis not present

## 2017-03-06 NOTE — Progress Notes (Signed)
Daily Session Note  Patient Details  Name: [name] MRN: [id number] Date of Birth: 1934/02/19 Referring Provider:     CARDIAC REHAB PHASE II ORIENTATION from 01/03/2017 in [facility]  Referring Provider  Dr. Domenic Polite      Encounter Date: 03/06/2017  Check In:     Session Check In - 03/06/17 1539      Check-In   Location [facility]   Staff Present Aundra Dubin, RN, BSN   Supervising physician immediately available to respond to emergencies See telemetry face sheet for immediately available MD   Medication changes reported     No   Fall or balance concerns reported    No   Tobacco Cessation No Change   Warm-up and Cool-down Performed as group-led instruction   Resistance Training Performed Yes   VAD Patient? No     Pain Assessment   Currently in Pain? Yes   Pain Score 4    Pain Location Knee   Pain Orientation Right;Left   Pain Descriptors / Indicators Aching   Pain Radiating Towards No radiation   Pain Onset In the past 7 days   Pain Frequency Constant   Aggravating Factors  Weight bearing   Pain Relieving Factors Topical ointments/tylenol   Effect of Pain on Daily Activities Decreased mobility   Multiple Pain Sites No      Capillary Blood Glucose: No results found for this or any previous visit (from the past 24 hour(s)).    History  Smoking Status  . Never Smoker  Smokeless Tobacco  . Never Used    Goals Met:  Independence with exercise equipment Exercise tolerated well No report of cardiac concerns or symptoms Strength training completed today  Goals Unmet:  Not Applicable  Comments: Check out 1645.   Dr. Kate Sable is Medical Director for [facility].

## 2017-03-06 NOTE — Telephone Encounter (Signed)
Returned call to pt and she has been made aware of her lab results and she verbalized understanding.  °

## 2017-03-06 NOTE — Telephone Encounter (Signed)
-----   Message from Laurann Montanaayna N Dunn, [place] sent at 03/02/2017  7:32 AM EDT ----- Please let patient know cholesterol looks much better - LDL is significantly better from 163 down to 47. Liver function stable. HDL still a little low so continue good diet and exercise and f/u as planned. Dayna Dunn PA-C

## 2017-03-07 ENCOUNTER — Encounter: Payer: Self-pay | Admitting: Cardiology

## 2017-03-07 NOTE — Progress Notes (Signed)
Cardiology Office Note  Date: 03/08/2017   ID: Kelsay, Haggard [birth year], MRN [id number]  PCP: Assunta Found, MD  Primary Cardiologist: Nona Dell, MD   Chief Complaint  Patient presents with  . Coronary Artery Disease    History of Present Illness: [name] is an 83 y.o. female last seen by Ms. Dunn PA-C in March. She was previously followed by Dr. Tresa Endo, now establishing office follow-up with me. She has a history of NSTEMI in March of this year and underwent placement of DES to the distal circumflex at that time. She presents today with her son and daughter. She denies any exertional chest pain, has stable mild dyspnea on exertion. She does feel like her energy level has improved.   We did have a long discussion today regarding her participation in cardiac rehabilitation. She states that she is having a lot of significant arthritic pain in her knees and neck and has a hard time with rehabilitation activities, although it sounds like they have been modified somewhat for her. After speaking with her and her family in detail, decision was made to stop cardiac rehabilitation early.  We went over her cardiac medications which are outlined below. She reports compliance. No spontaneous bleeding problems.  I reviewed her recent follow-up lab work which shows much improved lipid control. She has been on Crestor.  Past Medical History:  Diagnosis Date  . Arthritis   . CKD (chronic kidney disease), stage III   . Complete heart block (HCC) 11/16/2016   a. transient during NSTEMI, resolved with revascularization.  . Coronary artery disease 11/2009   a. with prior stent placement to the ramus intermedius and RCA. b. NSTEMI 11/2016 s/p DES to DES to distal Cx with moderate residual dz.  . Essential hypertension   . Mixed hyperlipidemia   . Non-ST elevation (NSTEMI) myocardial infarction (HCC) 11/16/2016  . Obesity   . Reflux esophagitis   . Sleep apnea 09/27/2010   Untreated,  REM 64.7/hr AHI 18.5/hr RDI 19.2/hr. Patuent refused CPAP therapy    Past Surgical History:  Procedure Laterality Date  . CHOLECYSTECTOMY    . CORONARY ANGIOPLASTY WITH STENT PLACEMENT  2007   A 3.0x63mm CYPHER stent post dilated to 3.29 mm the 100% occlusion was reduced to 0%  . CORONARY STENT INTERVENTION N/A 11/16/2016   Procedure: Coronary Stent Intervention;  Surgeon: Tonny Bollman, MD;  Location: [facility] INVASIVE CV LAB;  Service: Cardiovascular;  Laterality: N/A;  . LEFT HEART CATH AND CORONARY ANGIOGRAPHY N/A 11/16/2016   Procedure: Left Heart Cath and Coronary Angiography;  Surgeon: Tonny Bollman, MD;  Location: [facility] INVASIVE CV LAB;  Service: Cardiovascular;  Laterality: N/A;  . LEFT HEART CATHETERIZATION WITH CORONARY ANGIOGRAM N/A 07/12/2014   Procedure: LEFT HEART CATHETERIZATION WITH CORONARY ANGIOGRAM;  Surgeon: Micheline Chapman, MD;  Location: [facility] CATH LAB;  Service: Cardiovascular;  Laterality: N/A;  . TEMPORARY PACEMAKER N/A 11/16/2016   Procedure: Temporary Pacemaker;  Surgeon: Tonny Bollman, MD;  Location: [facility] INVASIVE CV LAB;  Service: Cardiovascular;  Laterality: N/A;    Current Outpatient Prescriptions  Medication Sig Dispense Refill  . acetaminophen (TYLENOL) 325 MG tablet Take 325-650 mg by mouth every 6 (six) hours as needed (for headaches).    Marland Kitchen amLODipine (NORVASC) 5 MG tablet Take 1 tablet (5 mg total) by mouth daily. 90 tablet 0  . aspirin 81 MG tablet Take 81 mg by mouth daily.    . fluticasone (FLONASE) 50 MCG/ACT nasal spray Place 1 spray  into both nostrils as needed for allergies or rhinitis.     . furosemide (LASIX) 40 MG tablet TAKE 1 TABLET BY MOUTH DAILY AS NEEDED FOR EDEMA 30 tablet 0  . hydrALAZINE (APRESOLINE) 100 MG tablet Take 0.5 tablets (50 mg total) by mouth 3 (three) times daily. 45 tablet 6  . Iron Succinyl-Protein Complex 40 MG/15ML SOLN Take 40 mg by mouth daily.    Marland Kitchen losartan (COZAAR) 50 MG tablet Take 1 tablet (50 mg total) by mouth daily. 30 tablet 5   . nitroGLYCERIN (NITROSTAT) 0.4 MG SL tablet Place 1 tablet (0.4 mg total) under the tongue every 5 (five) minutes as needed. 25 tablet 3  . pantoprazole (PROTONIX) 40 MG tablet Take 1 tablet (40 mg total) by mouth daily. 30 tablet 6  . rosuvastatin (CRESTOR) 40 MG tablet Take 1 tablet (40 mg total) by mouth daily. 90 tablet 3  . ticagrelor (BRILINTA) 90 MG TABS tablet Take 1 tablet (90 mg total) by mouth 2 (two) times daily. 180 tablet 3  . Vitamin D, Ergocalciferol, (DRISDOL) 50000 units CAPS capsule Take 50,000 Units by mouth every 30 (thirty) days.  0   No current facility-administered medications for this visit.    Allergies:  Atorvastatin; Contrast media [iodinated diagnostic agents]; Darvon [propoxyphene]; and Codeine   Social History: The patient  reports that she has never smoked. She has never used smokeless tobacco. She reports that she does not drink alcohol or use drugs.   ROS:  Please see the history of present illness. Otherwise, complete review of systems is positive for bilateral knee pain and cervical pain.  All other systems are reviewed and negative.   Physical Exam: VS:  BP 122/60   Pulse 89   Ht 5\' 5"  (1.651 m)   Wt 185 lb (83.9 kg)   SpO2 99%   BMI 30.79 kg/m , BMI Body mass index is 30.79 kg/m.  Wt Readings from Last 3 Encounters:  03/08/17 185 lb (83.9 kg)  01/03/17 185 lb (83.9 kg)  11/26/16 179 lb (81.2 kg)    General: Overweight elderly woman, appears comfortable at rest. HEENT: Conjunctiva and lids normal, oropharynx clear. Neck: Supple, no elevated JVP or carotid bruits, no thyromegaly. Lungs: Clear to auscultation, nonlabored breathing at rest. Cardiac: Regular rate and rhythm, no S3, soft systolic murmur, no pericardial rub. Abdomen: Soft, nontender, bowel sounds present, no guarding or rebound. Extremities: No pitting edema, distal pulses 2+. Skin: Warm and dry. Musculoskeletal: No kyphosis. Neuropsychiatric: Alert and oriented x3, affect  grossly appropriate.  ECG: I personally reviewed the tracing from 11/26/2016 which showed sinus rhythm with incomplete right bundle branch block, left anterior fascicular block, likely inferolateral infarct pattern.  Recent Labwork: 11/19/2016: BUN 17; Creatinine, Ser 1.18; Hemoglobin 11.7; Platelets 182; Potassium 3.6; Sodium 139 02/28/2017: ALT 10; AST 17     Component Value Date/Time   CHOL 116 02/28/2017 0717   CHOL 146 01/19/2015 1048   TRIG 143 02/28/2017 0717   HDL 40 (L) 02/28/2017 0717   HDL 48 01/19/2015 1048   CHOLHDL 2.9 02/28/2017 0717   VLDL 29 02/28/2017 0717   LDLCALC 47 02/28/2017 0717   LDLCALC 77 01/19/2015 1048    Other Studies Reviewed Today:  Echocardiogram 11/18/2016: Study Conclusions  - Left ventricle: The cavity size was normal. Wall thickness was   increased in a pattern of severe LVH. Systolic function was   vigorous. The estimated ejection fraction was in the range of 65%   to 70%. Wall  motion was normal; there were no regional wall   motion abnormalities. Doppler parameters are consistent with   abnormal left ventricular relaxation (grade 1 diastolic   dysfunction). - Aortic valve: Mildly calcified annulus. Trileaflet; mildly   calcified leaflets. There was mild regurgitation. Mean gradient   (S): 8 mm Hg. Peak gradient (S): 17 mm Hg. VTI ratio of LVOT to   aortic valve: 0.71. - Mitral valve: Calcified annulus. There was mild regurgitation. - Right atrium: Central venous pressure (est): 3 mm Hg. - Atrial septum: No defect or patent foramen ovale was identified. - Tricuspid valve: There was trivial regurgitation. - Pulmonary arteries: PA peak pressure: 22 mm Hg (S). - Pericardium, extracardiac: A small pericardial effusion was   identified circumferential to the heart. Somewhat larger   collection anterior to the apical right ventricle. No obvious   hemodynamic compromise. With recent temporary transvenous   pacemaker in place, consider a  follow-up study to reassess.  Impressions:  - Severe LVH with LVEF 65-70% and grade 1 diastolic dysfunction.   Mild mitral regurgitation. Sclerotic aortic valve with mild   aortic regurgitation. Trivial tricuspid regurgitation with PASP   22 mmHg. A small pericardial effusion was identified   circumferential to the heart. Somewhat larger collection anterior   to the apical right ventricle. No obvious hemodynamic compromise.   With recent temporary transvenous pacemaker in place, consider a   follow-up study to reassess.  Cardiac catheterization and PCI 11/16/2016: 1. Critical stenosis of the distal circumflex treated successfully with PCI using a 2.5 mm drug-eluting stent 2. Moderate mid LAD stenosis, does not appear to be flow-obstructive 3. Moderate proximal RCA stenosis (nondominant vessel) 4. Patent stents in the RCA and ramus intermedius 5. Normal LVEDP-will assess LV function by echo because of chronic kidney disease 6. Successful insertion of a temporary transvenous pacing wire for treatment of complete heart block  Assessment and Plan:  1. Symptomatically stable CAD status post NSTEMI in March of this year with DES to the distal circumflex. As noted above, she will stop with cardiac rehabilitation at this point and continue medical therapy with plan for observation.  2. Hyperlipidemia, continues on Crestor with good lipid control at this point.  3. Essential hypertension, blood pressure is well controlled today.  4. CKD stage III, follows with nephrology.  Current medicines were reviewed with the patient today.  Disposition: Follow-up in 3 months.  Signed, Jonelle SidleSamuel G. Gilmore List, MD, [facility]landsFACC 03/08/2017 1:44 PM    [facility] [address], [place], [place] [address] Phone: [phone]; Fax: [phone]

## 2017-03-08 ENCOUNTER — Encounter: Payer: Self-pay | Admitting: Cardiology

## 2017-03-08 ENCOUNTER — Encounter (HOSPITAL_COMMUNITY): Admission: RE | Admit: 2017-03-08 | Payer: Medicare Other | Source: Ambulatory Visit

## 2017-03-08 ENCOUNTER — Ambulatory Visit (INDEPENDENT_AMBULATORY_CARE_PROVIDER_SITE_OTHER): Payer: Medicare Other | Admitting: Cardiology

## 2017-03-08 VITALS — BP 122/60 | HR 89 | Ht 65.0 in | Wt 185.0 lb

## 2017-03-08 DIAGNOSIS — N183 Chronic kidney disease, stage 3 unspecified: Secondary | ICD-10-CM

## 2017-03-08 DIAGNOSIS — I2510 Atherosclerotic heart disease of native coronary artery without angina pectoris: Secondary | ICD-10-CM

## 2017-03-08 DIAGNOSIS — E782 Mixed hyperlipidemia: Secondary | ICD-10-CM | POA: Diagnosis not present

## 2017-03-08 DIAGNOSIS — I10 Essential (primary) hypertension: Secondary | ICD-10-CM | POA: Diagnosis not present

## 2017-03-08 DIAGNOSIS — Z9861 Coronary angioplasty status: Secondary | ICD-10-CM

## 2017-03-08 NOTE — Patient Instructions (Signed)
Your physician recommends that you schedule a follow-up appointment in: 3 months Dr Diona BrownerMcDowell   Your physician recommends that you continue on your current medications as directed. Please refer to the Current Medication list given to you today.    If you need a refill on your cardiac medications before your next appointment, please call your pharmacy.     No testing or lab work ordered today.    You can stop cardiac rehab.     Thank you for choosing [facility] !

## 2017-03-11 ENCOUNTER — Encounter (HOSPITAL_COMMUNITY): Payer: Medicare Other

## 2017-03-13 ENCOUNTER — Encounter (HOSPITAL_COMMUNITY): Payer: Medicare Other

## 2017-03-15 ENCOUNTER — Encounter (HOSPITAL_COMMUNITY): Payer: Medicare Other

## 2017-03-18 ENCOUNTER — Encounter (HOSPITAL_COMMUNITY): Payer: Medicare Other

## 2017-03-20 ENCOUNTER — Encounter (HOSPITAL_COMMUNITY): Payer: Medicare Other

## 2017-03-20 NOTE — Progress Notes (Signed)
Discharge Summary  Patient Details  Name: [name] MRN: [id number] Date of Birth: [birth year] Referring Provider:     CARDIAC REHAB PHASE II ORIENTATION from 01/03/2017 in [facility] CARDIAC REHABILITATION  Referring Provider  Dr. Diona Browner       Number of Visits: 19  Reason for Discharge:  Early Exit:  Dr. Diona Browner took patient out of CR d/t her osteoarthritis in her knees. He felt like she had come to enough sessions to be benefitical and did not want to worsen her OA.  Smoking History:  History  Smoking Status  . Never Smoker  Smokeless Tobacco  . Never Used    Diagnosis:  NSTEMI (non-ST elevated myocardial infarction) (HCC)  Status post coronary artery stent placement  ADL UCSD:   Initial Exercise Prescription:     Initial Exercise Prescription - 01/03/17 1100      Date of Initial Exercise RX and Referring Provider   Date 01/03/17   Referring Provider Dr. Diona Browner     NuStep   Level 2   SPM 10   Minutes 15   METs 1.8     Arm Ergometer   Level 1.5   Watts 11   RPM 11   Minutes 20   METs 1.8     Prescription Details   Frequency (times per week) 3   Duration Progress to 30 minutes of continuous aerobic without signs/symptoms of physical distress     Intensity   THRR 40-80% of Max Heartrate 106-118-129   Ratings of Perceived Exertion 11-13   Perceived Dyspnea 0-4     Progression   Progression Continue progressive overload as per policy without signs/symptoms or physical distress.     Resistance Training   Training Prescription Yes   Weight 1   Reps 10-15      Discharge Exercise Prescription (Final Exercise Prescription Changes):     Exercise Prescription Changes - 02/21/17 1500      Response to Exercise   Blood Pressure (Admit) 120/60   Blood Pressure (Exercise) 142/60   Blood Pressure (Exit) 118/60   Heart Rate (Admit) 75 bpm   Heart Rate (Exercise) 93 bpm   Heart Rate (Exit) 84 bpm   Rating of Perceived Exertion (Exercise) 10    Duration Progress to 30 minutes of  aerobic without signs/symptoms of physical distress   Intensity THRR unchanged     Progression   Progression Continue to progress workloads to maintain intensity without signs/symptoms of physical distress.     Resistance Training   Training Prescription Yes   Weight 1   Reps 10-15     NuStep   Level 2   SPM 26   Minutes 30   METs 2.1     Home Exercise Plan   Plans to continue exercise at Home (comment)   Frequency Add 2 additional days to program exercise sessions.      Functional Capacity:   Psychological, QOL, Others - Outcomes: PHQ 2/9: Depression screen PHQ 2/9 01/03/2017  Decreased Interest 0  Down, Depressed, Hopeless 0  PHQ - 2 Score 0  Altered sleeping 0  Tired, decreased energy 1  Change in appetite 1  Feeling bad or failure about yourself  0  Trouble concentrating 0  Moving slowly or fidgety/restless 0  Suicidal thoughts 0  PHQ-9 Score 2  Difficult doing work/chores Not difficult at all    Quality of Life:     Quality of Life - 01/03/17 1158      Quality of  Life Scores   Health/Function Pre 21.93 %   Socioeconomic Pre 24.17 %   Psych/Spiritual Pre 30 %   Family Pre 27.5 %   GLOBAL Pre 24.89 %      Personal Goals: Goals established at orientation with interventions provided to work toward goal.     Personal Goals and Risk Factors at Admission - 01/03/17 1225      Core Components/Risk Factors/Patient Goals on Admission    Weight Management Weight Maintenance   Lipids Yes   Intervention Provide education and support for participant on nutrition & aerobic/resistive exercise along with prescribed medications to achieve LDL 70mg , HDL >40mg .   Expected Outcomes Short Term: Participant states understanding of desired cholesterol values and is compliant with medications prescribed. Participant is following exercise prescription and nutrition guidelines.;Long Term: Cholesterol controlled with medications as  prescribed, with individualized exercise RX and with personalized nutrition plan. Value goals: LDL < 70mg , HDL > 40 mg.   Personal Goal Other Yes   Personal Goal Become stronger, Do my activities and not give out of breath   Intervention Attend CR 3 x week and supplement exercise at home 2 x week.    Expected Outcomes Reach personal goals.        Personal Goals Discharge:     Goals and Risk Factor Review    Row Name 01/03/17 1227 01/31/17 0758 02/27/17 1436         Core Components/Risk Factors/Patient Goals Review   Personal Goals Review Weight Management/Obesity;Improve shortness of breath with ADL's Weight Management/Obesity;Improve shortness of breath with ADL's Weight Management/Obesity;Improve shortness of breath with ADL's  Become stronger; not get SOB     Review  - Patient has completed 9 sessions maintaining her weight. She has chronic OA in her knees with chronic constant pain. She says her legs already feel stronger with the 9 sessions. She continues to have SOB with exerction. Her gait is very unstable using a cane. She also has a walker she uses at home. Will continue to monitor for progress.  Patient has completed 15 sessions gaining 3 lbs. She continues to have bilateral knee pain secondary to OA. She has had injections of cortisone since her last ITP with no change in her pain level. She is not able to progress and is only able to do the NuStep d/t pain and shoulder issues. She says her legs continue to feels stronger. She continues to ambulate using a cane. She says the program is helping her and she wants to continue. Will continue to monitor.      Expected Outcomes  - Patient will continue to attend sessions meeting her personal goals.  Patient will be able to complete the program.        Nutrition & Weight - Outcomes:     Pre Biometrics - 01/03/17 1157      Pre Biometrics   Height 5\' 2"  (1.575 m)   Waist Circumference 42 inches   Hip Circumference 42 inches   Waist  to Hip Ratio 1 %   BMI (Calculated) 33.9   Triceps Skinfold 14 mm   % Body Fat 43.5 %   Grip Strength --  Machine Broken   Flexibility 0 in   Single Leg Stand 0 seconds       Nutrition:   Nutrition Discharge:   Education Questionnaire Score:     Knowledge Questionnaire Score - 01/03/17 1220      Knowledge Questionnaire Score   Pre Score 16/24

## 2017-03-20 NOTE — Progress Notes (Signed)
Cardiac Individual Treatment Plan  Patient Details  Name: [name] MRN: [id number] Date of Birth: April 09, 1934 Referring Provider:     CARDIAC REHAB PHASE II ORIENTATION from 01/03/2017 in [facility] CARDIAC REHABILITATION  Referring Provider  Dr. Diona Browner      Initial Encounter Date:    CARDIAC REHAB PHASE II ORIENTATION from 01/03/2017 in [place] CARDIAC REHABILITATION  Date  01/03/17  Referring Provider  Dr. Diona Browner      Visit Diagnosis: NSTEMI (non-ST elevated myocardial infarction) [facility])  Status post coronary artery stent placement  Patient's Home Medications on Admission:  Current Outpatient Prescriptions:  .  acetaminophen (TYLENOL) 325 MG tablet, Take 325-650 mg by mouth every 6 (six) hours as needed (for headaches)., Disp: , Rfl:  .  amLODipine (NORVASC) 5 MG tablet, Take 1 tablet (5 mg total) by mouth daily., Disp: 90 tablet, Rfl: 0 .  aspirin 81 MG tablet, Take 81 mg by mouth daily., Disp: , Rfl:  .  fluticasone (FLONASE) 50 MCG/ACT nasal spray, Place 1 spray into both nostrils as needed for allergies or rhinitis. , Disp: , Rfl:  .  furosemide (LASIX) 40 MG tablet, TAKE 1 TABLET BY MOUTH DAILY AS NEEDED FOR EDEMA, Disp: 30 tablet, Rfl: 0 .  hydrALAZINE (APRESOLINE) 100 MG tablet, Take 0.5 tablets (50 mg total) by mouth 3 (three) times daily., Disp: 45 tablet, Rfl: 6 .  Iron Succinyl-Protein Complex 40 MG/15ML SOLN, Take 40 mg by mouth daily., Disp: , Rfl:  .  losartan (COZAAR) 50 MG tablet, Take 1 tablet (50 mg total) by mouth daily., Disp: 30 tablet, Rfl: 5 .  nitroGLYCERIN (NITROSTAT) 0.4 MG SL tablet, Place 1 tablet (0.4 mg total) under the tongue every 5 (five) minutes as needed., Disp: 25 tablet, Rfl: 3 .  pantoprazole (PROTONIX) 40 MG tablet, Take 1 tablet (40 mg total) by mouth daily., Disp: 30 tablet, Rfl: 6 .  rosuvastatin (CRESTOR) 40 MG tablet, Take 1 tablet (40 mg total) by mouth daily., Disp: 90 tablet, Rfl: 3 .  ticagrelor (BRILINTA) 90 MG TABS  tablet, Take 1 tablet (90 mg total) by mouth 2 (two) times daily., Disp: 180 tablet, Rfl: 3 .  Vitamin D, Ergocalciferol, (DRISDOL) 50000 units CAPS capsule, Take 50,000 Units by mouth every 30 (thirty) days., Disp: , Rfl: 0  Past Medical History: Past Medical History:  Diagnosis Date  . Arthritis   . CKD (chronic kidney disease), stage III   . Complete heart block (HCC) 11/16/2016   a. transient during NSTEMI, resolved with revascularization.  . Coronary artery disease 11/2009   a. with prior stent placement to the ramus intermedius and RCA. b. NSTEMI 11/2016 s/p DES to DES to distal Cx with moderate residual dz.  . Essential hypertension   . Mixed hyperlipidemia   . Non-ST elevation (NSTEMI) myocardial infarction (HCC) 11/16/2016  . Obesity   . Reflux esophagitis   . Sleep apnea 09/27/2010   Untreated, REM 64.7/hr AHI 18.5/hr RDI 19.2/hr. Patuent refused CPAP therapy    Tobacco Use: History  Smoking Status  . Never Smoker  Smokeless Tobacco  . Never Used    Labs: Recent Review Flowsheet Data    Labs for ITP Cardiac and Pulmonary Rehab Latest Ref Rng & Units 11/16/2014 01/19/2015 10/20/2015 11/17/2016 02/28/2017   Cholestrol <200 mg/dL 782(N) 562 - 130(Q) 657   LDLCALC <100 mg/dL 846(N) 77 - 629(B) 47   HDL >50 mg/dL 44 48 - 28(U) 13(K)   Trlycerides <150 mg/dL 440(N) 027 -  89 143   Hemoglobin A1c <5.7 % - - - - -   TCO2 0 - 100 mmol/L - - 23 - -      Capillary Blood Glucose: Lab Results  Component Value Date   GLUCAP 163 (H) 07/13/2014   GLUCAP 110 (H) 07/12/2014   GLUCAP 110 (H) 07/11/2014     Exercise Target Goals:    Exercise Program Goal: Individual exercise prescription set with THRR, safety & activity barriers. Participant demonstrates ability to understand and report RPE using BORG scale, to self-measure pulse accurately, and to acknowledge the importance of the exercise prescription.  Exercise Prescription Goal: Starting with aerobic activity 30 plus minutes a  day, 3 days per week for initial exercise prescription. Provide home exercise prescription and guidelines that participant acknowledges understanding prior to discharge.  Activity Barriers & Risk Stratification:   6 Minute Walk:   Oxygen Initial Assessment:   Oxygen Re-Evaluation:   Oxygen Discharge (Final Oxygen Re-Evaluation):   Initial Exercise Prescription:     Initial Exercise Prescription - 01/03/17 1100      Date of Initial Exercise RX and Referring Provider   Date 01/03/17   Referring Provider Dr. Diona Browner     NuStep   Level 2   SPM 10   Minutes 15   METs 1.8     Arm Ergometer   Level 1.5   Watts 11   RPM 11   Minutes 20   METs 1.8     Prescription Details   Frequency (times per week) 3   Duration Progress to 30 minutes of continuous aerobic without signs/symptoms of physical distress     Intensity   THRR 40-80% of Max Heartrate 106-118-129   Ratings of Perceived Exertion 11-13   Perceived Dyspnea 0-4     Progression   Progression Continue progressive overload as per policy without signs/symptoms or physical distress.     Resistance Training   Training Prescription Yes   Weight 1   Reps 10-15      Perform Capillary Blood Glucose checks as needed.  Exercise Prescription Changes:      Exercise Prescription Changes    Row Name 01/28/17 1400 02/11/17 1400 02/21/17 1500         Response to Exercise   Blood Pressure (Admit) 130/70 140/62 120/60     Blood Pressure (Exercise) 144/66 140/60 142/60     Blood Pressure (Exit) 122/60 122/56 118/60     Heart Rate (Admit) 77 bpm 66 bpm 75 bpm     Heart Rate (Exercise) 103 bpm 101 bpm 93 bpm     Heart Rate (Exit) 79 bpm 87 bpm 84 bpm     Rating of Perceived Exertion (Exercise) 11 11 10      Duration Progress to 30 minutes of  aerobic without signs/symptoms of physical distress Progress to 30 minutes of  aerobic without signs/symptoms of physical distress Progress to 30 minutes of  aerobic without  signs/symptoms of physical distress     Intensity THRR unchanged THRR unchanged THRR unchanged       Progression   Progression Continue to progress workloads to maintain intensity without signs/symptoms of physical distress. Continue to progress workloads to maintain intensity without signs/symptoms of physical distress. Continue to progress workloads to maintain intensity without signs/symptoms of physical distress.       Resistance Training   Training Prescription Yes Yes Yes     Weight 1 1 1      Reps 10-15 10-15 10-15  NuStep   Level 2 2 2      SPM 33 26 26     Minutes 30 30 30      METs 3.58 2.1 2.1       Home Exercise Plan   Plans to continue exercise at Home (comment) Home (comment) Home (comment)     Frequency Add 2 additional days to program exercise sessions. Add 2 additional days to program exercise sessions. Add 2 additional days to program exercise sessions.        Exercise Comments:      Exercise Comments    Row Name 01/28/17 1442 02/11/17 1441 02/21/17 1525       Exercise Comments Patient was moved to only the nustep due to back and shoulder issues with the arm crank. Her level has remained the same but her watts have really increased.  Patient has not progressed due to health concerns such as joint pains.  Patient hasn ot increased in intensity due to higher BP.         Exercise Goals and Review:      Exercise Goals    Row Name 01/03/17 1224             Exercise Goals   Intervention Provide advice, education, support and counseling about physical activity/exercise needs.;Develop an individualized exercise prescription for aerobic and resistive training based on initial evaluation findings, risk stratification, comorbidities and participant's personal goals.       Expected Outcomes Achievement of increased cardiorespiratory fitness and enhanced flexibility, muscular endurance and strength shown through measurements of functional capacity and personal  statement of participant.       Increase Strength and Stamina Yes       Intervention Provide advice, education, support and counseling about physical activity/exercise needs.;Develop an individualized exercise prescription for aerobic and resistive training based on initial evaluation findings, risk stratification, comorbidities and participant's personal goals.       Expected Outcomes Achievement of increased cardiorespiratory fitness and enhanced flexibility, muscular endurance and strength shown through measurements of functional capacity and personal statement of participant.          Exercise Goals Re-Evaluation :     Exercise Goals Re-Evaluation    Row Name 01/31/17 0801 02/27/17 1439           Exercise Goal Re-Evaluation   Exercise Goals Review Increase Physical Activity;Increase Strenth and Stamina Increase Physical Activity;Increase Strenth and Stamina      Comments After 9 sessions, patient states she feels stronger and believes the program is benefiting her. Her chronic knee pain impedes her progress but she is doing well.  Patient is not able to progress d/t bilateral knee pain. She says she feels stronger and hopes to continue to gain strength.       Expected Outcomes Patient will complete the program with increased strength, stamina, and activity. Patient will be able to complete the program with increased strength, stamina, and activity.          Discharge Exercise Prescription (Final Exercise Prescription Changes):     Exercise Prescription Changes - 02/21/17 1500      Response to Exercise   Blood Pressure (Admit) 120/60   Blood Pressure (Exercise) 142/60   Blood Pressure (Exit) 118/60   Heart Rate (Admit) 75 bpm   Heart Rate (Exercise) 93 bpm   Heart Rate (Exit) 84 bpm   Rating of Perceived Exertion (Exercise) 10   Duration Progress to 30 minutes of  aerobic without signs/symptoms of physical distress  Intensity THRR unchanged     Progression   Progression  Continue to progress workloads to maintain intensity without signs/symptoms of physical distress.     Resistance Training   Training Prescription Yes   Weight 1   Reps 10-15     NuStep   Level 2   SPM 26   Minutes 30   METs 2.1     Home Exercise Plan   Plans to continue exercise at Home (comment)   Frequency Add 2 additional days to program exercise sessions.      Nutrition:  Target Goals: Understanding of nutrition guidelines, daily intake of sodium 1500mg , cholesterol 200mg , calories 30% from fat and 7% or less from saturated fats, daily to have 5 or more servings of fruits and vegetables.  Biometrics:     Pre Biometrics - 01/03/17 1157      Pre Biometrics   Height 5\' 2"  (1.575 m)   Waist Circumference 42 inches   Hip Circumference 42 inches   Waist to Hip Ratio 1 %   BMI (Calculated) 33.9   Triceps Skinfold 14 mm   % Body Fat 43.5 %   Grip Strength --  Machine Broken   Flexibility 0 in   Single Leg Stand 0 seconds       Nutrition Therapy Plan and Nutrition Goals:   Nutrition Discharge: Rate Your Plate Scores:   Nutrition Goals Re-Evaluation:   Nutrition Goals Discharge (Final Nutrition Goals Re-Evaluation):   Psychosocial: Target Goals: Acknowledge presence or absence of significant depression and/or stress, maximize coping skills, provide positive support system. Participant is able to verbalize types and ability to use techniques and skills needed for reducing stress and depression.  Initial Review & Psychosocial Screening:     Initial Psych Review & Screening - 01/03/17 1228      Initial Review   Current issues with None Identified     Family Dynamics   Good Support System? Yes     Barriers   Psychosocial barriers to participate in program There are no identifiable barriers or psychosocial needs.  QOL score 24.89     Screening Interventions   Interventions Encouraged to exercise      Quality of Life Scores:     Quality of Life -  01/03/17 1158      Quality of Life Scores   Health/Function Pre 21.93 %   Socioeconomic Pre 24.17 %   Psych/Spiritual Pre 30 %   Family Pre 27.5 %   GLOBAL Pre 24.89 %      PHQ-9: Recent Review Flowsheet Data    [facility]   Decreased Interest 0   Down, Depressed, Hopeless 0   PHQ - 2 Score 0   Altered sleeping 0   Tired, decreased energy 1   Change in appetite 1   Feeling bad or failure about yourself  0   Trouble concentrating 0   Moving slowly or fidgety/restless 0   Suicidal thoughts 0   PHQ-9 Score 2   Difficult doing work/chores Not difficult at all     Interpretation of Total Score  Total Score Depression Severity:  1-4 = Minimal depression, 5-9 = Mild depression, 10-14 = Moderate depression, 15-19 = Moderately severe depression, 20-27 = Severe depression   Psychosocial Evaluation and Intervention:     Psychosocial Evaluation - 01/03/17 1228      Psychosocial Evaluation & Interventions   Interventions Encouraged to exercise with the program and follow exercise prescription   Continue Psychosocial  Services  No Follow up required      Psychosocial Re-Evaluation:     Psychosocial Re-Evaluation    Row Name 01/31/17 0802 02/27/17 1440           Psychosocial Re-Evaluation   Current issues with None Identified None Identified      Expected Outcomes Patient will have no psychosocial barriers identified at discharge.  Patient will have no psychosocial issues idenified at discharge.       Interventions Encouraged to attend Cardiac Rehabilitation for the exercise Encouraged to attend Cardiac Rehabilitation for the exercise      Continue Psychosocial Services  No Follow up required No Follow up required         Psychosocial Discharge (Final Psychosocial Re-Evaluation):     Psychosocial Re-Evaluation - 02/27/17 1440      Psychosocial Re-Evaluation   Current issues with None Identified   Expected Outcomes Patient will have no  psychosocial issues idenified at discharge.    Interventions Encouraged to attend Cardiac Rehabilitation for the exercise   Continue Psychosocial Services  No Follow up required      Vocational Rehabilitation: Provide vocational rehab assistance to qualifying candidates.   Vocational Rehab Evaluation & Intervention:     Vocational Rehab - 01/03/17 1220      Initial Vocational Rehab Evaluation & Intervention   Assessment shows need for Vocational Rehabilitation No      Education: Education Goals: Education classes will be provided on a weekly basis, covering required topics. Participant will state understanding/return demonstration of topics presented.  Learning Barriers/Preferences:     Learning Barriers/Preferences - 01/03/17 1220      Learning Barriers/Preferences   Learning Barriers None   Learning Preferences Pictoral;Video      Education Topics: Hypertension, Hypertension Reduction -Define heart disease and high blood pressure. Discus how high blood pressure affects the body and ways to reduce high blood pressure.   CARDIAC REHAB PHASE II EXERCISE from 03/06/2017 in [place] CARDIAC REHABILITATION  Date  01/09/17  Educator  Hart [place]  Instruction Review Code  2- meets goals/outcomes      Exercise and Your Heart -Discuss why it is important to exercise, the FITT principles of exercise, normal and abnormal responses to exercise, and how to exercise safely.   CARDIAC REHAB PHASE II EXERCISE from 03/06/2017 in [place] [facility] CARDIAC REHABILITATION  Date  01/17/17  Educator  DC  Instruction Review Code  2- meets goals/outcomes      Angina -Discuss definition of angina, causes of angina, treatment of angina, and how to decrease risk of having angina.   Cardiac Medications -Review what the following cardiac medications are used for, how they affect the body, and side effects that may occur when taking the medications.  Medications include Aspirin, Beta blockers,  calcium channel blockers, ACE Inhibitors, angiotensin receptor blockers, diuretics, digoxin, and antihyperlipidemics.   CARDIAC REHAB PHASE II EXERCISE from 03/06/2017 in [place] [facility] CARDIAC REHABILITATION  Date  01/30/17  Educator  DC  Instruction Review Code  2- meets goals/outcomes      Congestive Heart Failure -Discuss the definition of CHF, how to live with CHF, the signs and symptoms of CHF, and how keep track of weight and sodium intake.   Heart Disease and Intimacy -Discus the effect sexual activity has on the heart, how changes occur during intimacy as we age, and safety during sexual activity.   CARDIAC REHAB PHASE II EXERCISE from 03/06/2017 in [facility] CARDIAC REHABILITATION  Date  02/13/17  Educator  DJ  Instruction Review Code  2- meets goals/outcomes      Smoking Cessation / COPD -Discuss different methods to quit smoking, the health benefits of quitting smoking, and the definition of COPD.   Nutrition I: Fats -Discuss the types of cholesterol, what cholesterol does to the heart, and how cholesterol levels can be controlled.   CARDIAC REHAB PHASE II EXERCISE from 03/06/2017 in [place] [facility] CARDIAC REHABILITATION  Date  02/27/17  Educator  DC  Instruction Review Code  2- meets goals/outcomes      Nutrition II: Labels -Discuss the different components of food labels and how to read food label   CARDIAC REHAB PHASE II EXERCISE from 03/06/2017 in [place] [facility] CARDIAC RE[facility]ATION  Date  03/06/17  Educator  DC  Instruction Review Code  2- meets goals/outcomes      Heart Parts and Heart Disease -Discuss the anatomy of the heart, the pathway of blood circulation through the heart, and these are affected by heart disease.   Stress I: Signs and Symptoms -Discuss the causes of stress, how stress may lead to anxiety and depression, and ways to limit stress.   Stress II: Relaxation -Discuss different types of relaxation techniques to limit stress.   Warning Signs  of Stroke / TIA -Discuss definition of a stroke, what the signs and symptoms are of a stroke, and how to identify when someone is having stroke.   Knowledge Questionnaire Score:     Knowledge Questionnaire Score - 01/03/17 1220      Knowledge Questionnaire Score   Pre Score 16/24      Core Components/Risk Factors/Patient Goals at Admission:     Personal Goals and Risk Factors at Admission - 01/03/17 1225      Core Components/Risk Factors/Patient Goals on Admission    Weight Management Weight Maintenance   Lipids Yes   Intervention Provide education and support for participant on nutrition & aerobic/resistive exercise along with prescribed medications to achieve LDL 70mg , HDL >40mg .   Expected Outcomes Short Term: Participant states understanding of desired cholesterol values and is compliant with medications prescribed. Participant is following exercise prescription and nutrition guidelines.;Long Term: Cholesterol controlled with medications as prescribed, with individualized exercise RX and with personalized nutrition plan. Value goals: LDL < 70mg , HDL > 40 mg.   Personal Goal Other Yes   Personal Goal Become stronger, Do my activities and not give out of breath   Intervention Attend CR 3 x week and supplement exercise at home 2 x week.    Expected Outcomes Reach personal goals.       Core Components/Risk Factors/Patient Goals Review:      Goals and Risk Factor Review    Row Name 01/03/17 1227 01/31/17 0758 02/27/17 1436         Core Components/Risk Factors/Patient Goals Review   Personal Goals Review Weight Management/Obesity;Improve shortness of breath with ADL's Weight Management/Obesity;Improve shortness of breath with ADL's Weight Management/Obesity;Improve shortness of breath with ADL's  Become stronger; not get SOB     Review  - Patient has completed 9 sessions maintaining her weight. She has chronic OA in her knees with chronic constant pain. She says her legs  already feel stronger with the 9 sessions. She continues to have SOB with exerction. Her gait is very unstable using a cane. She also has a walker she uses at home. Will continue to monitor for progress.  Patient has completed 15 sessions gaining 3 lbs. She continues to have bilateral knee pain  secondary to OA. She has had injections of cortisone since her last ITP with no change in her pain level. She is not able to progress and is only able to do the NuStep d/t pain and shoulder issues. She says her legs continue to feels stronger. She continues to ambulate using a cane. She says the program is helping her and she wants to continue. Will continue to monitor.      Expected Outcomes  - Patient will continue to attend sessions meeting her personal goals.  Patient will be able to complete the program.        Core Components/Risk Factors/Patient Goals at Discharge (Final Review):      Goals and Risk Factor Review - 02/27/17 1436      Core Components/Risk Factors/Patient Goals Review   Personal Goals Review Weight Management/Obesity;Improve shortness of breath with ADL's  Become stronger; not get SOB   Review Patient has completed 15 sessions gaining 3 lbs. She continues to have bilateral knee pain secondary to OA. She has had injections of cortisone since her last ITP with no change in her pain level. She is not able to progress and is only able to do the NuStep d/t pain and shoulder issues. She says her legs continue to feels stronger. She continues to ambulate using a cane. She says the program is helping her and she wants to continue. Will continue to monitor.    Expected Outcomes Patient will be able to complete the program.      ITP Comments:     ITP Comments    Row Name 01/03/17 1622 03/20/17 1500         ITP Comments Patient new to program. Plans to start Monday 01/07/17. Dr. Diona Browner took patient out of CR d/t her osteoarthritis in her knees. He felt like she had come to enough sessions  to be benefitical and did not want to worsen her OA.          Comments: Patient stopped coming to Cardiac Rehab on 03/06/17. Dr. Diona Browner took patient out of CR d/t her osteoarthritis in her knees. He felt like she had come to enough sessions to be benefitical and did not want to worsen her OA. Doctor will be informed.

## 2017-03-22 ENCOUNTER — Encounter (HOSPITAL_COMMUNITY): Payer: Medicare Other

## 2017-03-22 DIAGNOSIS — Z1389 Encounter for screening for other disorder: Secondary | ICD-10-CM | POA: Diagnosis not present

## 2017-03-22 DIAGNOSIS — E6609 Other obesity due to excess calories: Secondary | ICD-10-CM | POA: Diagnosis not present

## 2017-03-22 DIAGNOSIS — Z0000 Encounter for general adult medical examination without abnormal findings: Secondary | ICD-10-CM | POA: Diagnosis not present

## 2017-03-22 DIAGNOSIS — Z6831 Body mass index (BMI) 31.0-31.9, adult: Secondary | ICD-10-CM | POA: Diagnosis not present

## 2017-03-25 ENCOUNTER — Encounter (HOSPITAL_COMMUNITY): Payer: Medicare Other

## 2017-03-27 ENCOUNTER — Encounter (HOSPITAL_COMMUNITY): Payer: Medicare Other

## 2017-03-29 ENCOUNTER — Encounter (HOSPITAL_COMMUNITY): Payer: Medicare Other

## 2017-04-13 ENCOUNTER — Encounter (HOSPITAL_COMMUNITY): Payer: Self-pay

## 2017-04-13 ENCOUNTER — Emergency Department (HOSPITAL_COMMUNITY)
Admission: EM | Admit: 2017-04-13 | Discharge: 2017-04-13 | Disposition: A | Discharge status: Home / Self Care | Payer: Medicare Other | Attending: Emergency Medicine | Admitting: Emergency Medicine

## 2017-04-13 ENCOUNTER — Emergency Department (HOSPITAL_COMMUNITY): Payer: Medicare Other

## 2017-04-13 DIAGNOSIS — Z950 Presence of cardiac pacemaker: Secondary | ICD-10-CM | POA: Insufficient documentation

## 2017-04-13 DIAGNOSIS — R112 Nausea with vomiting, unspecified: Secondary | ICD-10-CM | POA: Diagnosis not present

## 2017-04-13 DIAGNOSIS — Z79899 Other long term (current) drug therapy: Secondary | ICD-10-CM | POA: Insufficient documentation

## 2017-04-13 DIAGNOSIS — K5730 Diverticulosis of large intestine without perforation or abscess without bleeding: Secondary | ICD-10-CM | POA: Diagnosis not present

## 2017-04-13 DIAGNOSIS — Z7982 Long term (current) use of aspirin: Secondary | ICD-10-CM | POA: Diagnosis not present

## 2017-04-13 DIAGNOSIS — R1013 Epigastric pain: Secondary | ICD-10-CM | POA: Diagnosis not present

## 2017-04-13 DIAGNOSIS — N189 Chronic kidney disease, unspecified: Secondary | ICD-10-CM | POA: Diagnosis not present

## 2017-04-13 DIAGNOSIS — R42 Dizziness and giddiness: Secondary | ICD-10-CM | POA: Diagnosis not present

## 2017-04-13 DIAGNOSIS — R110 Nausea: Secondary | ICD-10-CM

## 2017-04-13 DIAGNOSIS — I129 Hypertensive chronic kidney disease with stage 1 through stage 4 chronic kidney disease, or unspecified chronic kidney disease: Secondary | ICD-10-CM | POA: Diagnosis not present

## 2017-04-13 DIAGNOSIS — R1010 Upper abdominal pain, unspecified: Secondary | ICD-10-CM | POA: Diagnosis not present

## 2017-04-13 DIAGNOSIS — I2510 Atherosclerotic heart disease of native coronary artery without angina pectoris: Secondary | ICD-10-CM | POA: Diagnosis not present

## 2017-04-13 LAB — CBG MONITORING, ED: Glucose-Capillary: 110 mg/dL — ABNORMAL HIGH (ref 65–99)

## 2017-04-13 LAB — COMPREHENSIVE METABOLIC PANEL
ALT: 12 U/L — ABNORMAL LOW (ref 14–54)
AST: 19 U/L (ref 15–41)
Albumin: 3.3 g/dL — ABNORMAL LOW (ref 3.5–5.0)
Alkaline Phosphatase: 103 U/L (ref 38–126)
Anion gap: 5 (ref 5–15)
BUN: 14 mg/dL (ref 6–20)
CO2: 25 mmol/L (ref 22–32)
Calcium: 9.1 mg/dL (ref 8.9–10.3)
Chloride: 108 mmol/L (ref 101–111)
Creatinine, Ser: 1.27 mg/dL — ABNORMAL HIGH (ref 0.44–1.00)
GFR calc Af Amer: 45 mL/min — ABNORMAL LOW (ref >60)
GFR calc non Af Amer: 39 mL/min — ABNORMAL LOW (ref >60)
Glucose, Bld: 124 mg/dL — ABNORMAL HIGH (ref 65–99)
Potassium: 3.9 mmol/L (ref 3.5–5.1)
Sodium: 138 mmol/L (ref 135–145)
Total Bilirubin: 0.5 mg/dL (ref 0.3–1.2)
Total Protein: 6.6 g/dL (ref 6.5–8.1)

## 2017-04-13 LAB — CBC
HCT: 36.1 % (ref 36.0–46.0)
Hemoglobin: 11.7 g/dL — ABNORMAL LOW (ref 12.0–15.0)
MCH: 26.9 pg (ref 26.0–34.0)
MCHC: 32.4 g/dL (ref 30.0–36.0)
MCV: 83 fL (ref 78.0–100.0)
Platelets: 199 10*3/uL (ref 150–400)
RBC: 4.35 MIL/uL (ref 3.87–5.11)
RDW: 15.1 % (ref 11.5–15.5)
WBC: 5.9 10*3/uL (ref 4.0–10.5)

## 2017-04-13 LAB — LIPASE, BLOOD: Lipase: 23 U/L (ref 11–51)

## 2017-04-13 MED ORDER — FENTANYL CITRATE (PF) 100 MCG/2ML IJ SOLN
12.5000 ug | Freq: Once | INTRAMUSCULAR | Status: AC
  Administered 2017-04-13: 12.5 ug via INTRAVENOUS
  Filled 2017-04-13: qty 2

## 2017-04-13 MED ORDER — OMEPRAZOLE 20 MG PO CPDR: 20.0000 mg | DELAYED_RELEASE_CAPSULE | Freq: Every day | ORAL | 0 refills | Status: DC

## 2017-04-13 MED ORDER — SODIUM CHLORIDE 0.9 % IV SOLN
INTRAVENOUS | Status: DC
  Administered 2017-04-13: 13:00:00 via INTRAVENOUS

## 2017-04-13 MED ORDER — BARIUM SULFATE 2.1 % PO SUSP
ORAL | Status: AC
  Filled 2017-04-13: qty 2

## 2017-04-13 MED ORDER — ONDANSETRON HCL 4 MG/2ML IJ SOLN
4.0000 mg | Freq: Once | INTRAMUSCULAR | Status: AC
  Administered 2017-04-13: 4 mg via INTRAVENOUS
  Filled 2017-04-13: qty 2

## 2017-04-13 NOTE — Discharge Instructions (Signed)
As discussed, your evaluation today has been largely reassuring.  But, it is important that you monitor your condition carefully, and do not hesitate to return to the ED if you develop new, or concerning changes in your condition.  Otherwise, please follow-up with your physician and our gastroenterologist for appropriate ongoing care.  Be sure to discuss your history of hiatal hernia, as well as today's evaluation for abdominal pain.

## 2017-04-13 NOTE — ED Notes (Signed)
Pt verbalized understanding discharge instructions and denies any further needs or questions at this time. VS stable, ambulatory and steady gait.   

## 2017-04-13 NOTE — ED Triage Notes (Signed)
Patient reports that she developed positional dizziness 0800 with 1 episode of emesis and generalized abdominal pain. No neuro deficits. Equal grips, no drift, no facial drift. Speech clear. Reports ongoing nausea

## 2017-04-13 NOTE — ED Provider Notes (Addendum)
[facility]-EMERGENCY DEPT Provider Note   CSN: [id number] Arrival date & time: 04/13/17  1143     History   Chief Complaint Chief Complaint  Patient presents with  . Dizziness/abdominal pain    HPI [name] is a 83 y.o. female.  HPI  Patient presents with family members who provide history of present illness with the patient. Patient was in her usual state of health until awakening approximately 3 hours prior to my evaluation. She notes that on awakening she was nauseous, with dizziness, described as disequilibrium. She has had several episodes of vomiting. There is some abdominal pain in the upper mid abdomen, supraumbilical area, but no lateral abdominal pain, no lower abdominal pain. No change in bowel movements. Patient has a notable history of prior abdominal surgery including removal of cancerous tissue approximately 2 decades ago. Since onset today, no clear alleviating or exacerbating factors, and the patient notes that she is anorexic, not eating or drinking since onset.  Past Medical History:  Diagnosis Date  . Arthritis   . CKD (chronic kidney disease), stage III   . Complete heart block (HCC) 11/16/2016   a. transient during NSTEMI, resolved with revascularization.  . Coronary artery disease 11/2009   a. with prior stent placement to the ramus intermedius and RCA. b. NSTEMI 11/2016 s/p DES to DES to distal Cx with moderate residual dz.  . Essential hypertension   . Mixed hyperlipidemia   . Non-ST elevation (NSTEMI) myocardial infarction (HCC) 11/16/2016  . Obesity   . Reflux esophagitis   . Sleep apnea 09/27/2010   Untreated, REM 64.7/hr AHI 18.5/hr RDI 19.2/hr. Patuent refused CPAP therapy    Patient Active Problem List   Diagnosis Date Noted  . CKD (chronic kidney disease), stage III 11/23/2016  . Pericardial effusion 11/23/2016  . Complete heart block (HCC) 11/16/2016  . Non-ST elevation (NSTEMI) myocardial infarction (HCC) 11/16/2016  . Renal  insufficiency 11/03/2015  . 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 Uncontrolled hypertension 08/31/2015  . HTN (hypertension), malignant   . Symptomatic bradycardia   . Hyperlipidemia   . Epigastric fullness   . Near syncope 08/28/2015  . CAD S/P PCI   . Essential hypertension 03/05/2013  . Obesity (BMI 30-39.9) 03/05/2013  . Hyperlipidemia with target LDL less than 70 03/05/2013  . Obstructive sleep apnea 03/05/2013    Past Surgical History:  Procedure Laterality Date  . CHOLECYSTECTOMY    . CORONARY ANGIOPLASTY WITH STENT PLACEMENT  2007   A 3.0x23mm CYPHER stent post dilated to 3.29 mm the 100% occlusion was reduced to 0%  . CORONARY STENT INTERVENTION N/A 11/16/2016   Procedure: Coronary Stent Intervention;  Surgeon: Tonny Bollman, MD;  Location: [facility] INVASIVE CV LAB;  Service: Cardiovascular;  Laterality: N/A;  . LEFT HEART CATH AND CORONARY ANGIOGRAPHY N/A 11/16/2016   Procedure: Left Heart Cath and Coronary Angiography;  Surgeon: Tonny Bollman, MD;  Location: [facility] INVASIVE CV LAB;  Service: Cardiovascular;  Laterality: N/A;  . LEFT HEART CATHETERIZATION WITH CORONARY ANGIOGRAM N/A 07/12/2014   Procedure: LEFT HEART CATHETERIZATION WITH CORONARY ANGIOGRAM;  Surgeon: Micheline Chapman, MD;  Location: [facility] CATH LAB;  Service: Cardiovascular;  Laterality: N/A;  . TEMPORARY PACEMAKER N/A 11/16/2016   Procedure: Temporary Pacemaker;  Surgeon: Tonny Bollman, MD;  Location: [facility] INVASIVE CV LAB;  Service: Cardiovascular;  Laterality: N/A;    OB History    No data available       Home Medications  Prior to Admission medications   Medication Sig Start Date End Date Taking? Authorizing Provider  amLODipine (NORVASC) 5 MG tablet Take 1 tablet (5 mg total) by mouth daily. 02/15/17  Yes Jonelle SidleMcDowell, Samuel G, MD  aspirin 81 MG tablet Take 81 mg by mouth daily.   Yes [provider]  fluticasone (FLONASE) 50 MCG/ACT nasal spray Place 1 spray into  both nostrils as needed for allergies or rhinitis.  12/03/12  Yes [provider]  hydrALAZINE (APRESOLINE) 100 MG tablet Take 0.5 tablets (50 mg total) by mouth 3 (three) times daily. Patient taking differently: Take 50 mg by mouth 2 (two) times daily.  11/20/16  Yes Bhagat, Bhavinkumar, PA  losartan (COZAAR) 50 MG tablet Take 1 tablet (50 mg total) by mouth daily. 08/10/16  Yes Lennette BihariKelly, Thomas A, MD  pantoprazole (PROTONIX) 40 MG tablet Take 1 tablet (40 mg total) by mouth daily. 11/21/16  Yes Bhagat, Bhavinkumar, PA  rosuvastatin (CRESTOR) 40 MG tablet Take 1 tablet (40 mg total) by mouth daily. 11/20/16  Yes Bhagat, Bhavinkumar, PA  ticagrelor (BRILINTA) 90 MG TABS tablet Take 1 tablet (90 mg total) by mouth 2 (two) times daily. 11/20/16  Yes Bhagat, Bhavinkumar, PA  Vitamin D, Ergocalciferol, (DRISDOL) 50000 units CAPS capsule Take 50,000 Units by mouth every 30 (thirty) days. 11/07/16  Yes [provider]  acetaminophen (TYLENOL) 325 MG tablet Take 325-650 mg by mouth every 6 (six) hours as needed (for headaches).    [provider]  furosemide (LASIX) 40 MG tablet TAKE 1 TABLET BY MOUTH DAILY AS NEEDED FOR EDEMA Patient taking differently: TAKE 40mg  BY MOUTH DAILY AS NEEDED FOR EDEMA 10/10/15   Lennette BihariKelly, Thomas A, MD  nitroGLYCERIN (NITROSTAT) 0.4 MG SL tablet Place 1 tablet (0.4 mg total) under the tongue every 5 (five) minutes as needed. 11/26/16   Laurann Montanaunn, Dayna N, PA-C    Family History Family History  Problem Relation Age of Onset  . Hypertension Other     Social History Social History  Substance Use Topics  . Smoking status: Never Smoker  . Smokeless tobacco: Never Used  . Alcohol use No     Allergies   Atorvastatin; Contrast media [iodinated diagnostic agents]; Darvon [propoxyphene]; and Codeine   Review of Systems Review of Systems  Constitutional:       Per HPI, otherwise negative  HENT:       Per HPI, otherwise negative  Respiratory:       Per HPI,  otherwise negative  Cardiovascular:       Per HPI, otherwise negative  Gastrointestinal: Positive for abdominal pain, nausea and vomiting.  Endocrine:       Negative aside from HPI  Genitourinary:       Neg aside from HPI   Musculoskeletal:       Per HPI, otherwise negative  Skin: Negative.   Neurological: Negative for syncope.     Physical Exam Updated Vital Signs BP (!) 142/69   Pulse (!) 56   Temp 98 F (36.7 C)   Resp (!) 22   SpO2 100%   Physical Exam  Constitutional: She is oriented to person, place, and time. She appears well-developed and well-nourished. No distress.  HENT:  Head: Normocephalic and atraumatic.  Eyes: Conjunctivae and EOM are normal.  Cardiovascular: Normal rate and regular rhythm.   Pulmonary/Chest: Effort normal and breath sounds normal. No stridor. No respiratory distress.  Abdominal: She exhibits no distension.  Mild abdominal tenderness to palpation in the upper mid  abdomen, no guarding. Lower abdomen with surgical scar on the left lower quadrant  Musculoskeletal: She exhibits no edema.  Neurological: She is alert and oriented to person, place, and time. No cranial nerve deficit.  Skin: Skin is warm and dry.  Psychiatric: She has a normal mood and affect.  Nursing note and vitals reviewed.    ED Treatments / Results  Labs (all labs ordered are listed, but only abnormal results are displayed) Labs Reviewed  COMPREHENSIVE METABOLIC PANEL - Abnormal; Notable for the following:       Result Value   Glucose, Bld 124 (*)    Creatinine, Ser 1.27 (*)    Albumin 3.3 (*)    ALT 12 (*)    GFR calc non Af Amer 39 (*)    GFR calc Af Amer 45 (*)    All other components within normal limits  CBC - Abnormal; Notable for the following:    Hemoglobin 11.7 (*)    All other components within normal limits  CBG MONITORING, ED - Abnormal; Notable for the following:    Glucose-Capillary 110 (*)    All other components within normal limits  LIPASE,  BLOOD  URINALYSIS, ROUTINE W REFLEX MICROSCOPIC  CBG MONITORING, ED    EKG  EKG Interpretation  Date/Time:  Saturday April 13 2017 11:48:07 EDT Ventricular Rate:  69 PR Interval:  176 QRS Duration: 114 QT Interval:  480 QTC Calculation: 514 R Axis:   -81 Text Interpretation:  Normal sinus rhythm with sinus arrhythmia Pulmonary disease pattern Right bundle branch block Left anterior fascicular block No significant change since last tracing Abnormal ekg Confirmed by Gerhard Munch [phone]) on 04/13/2017 12:06:57 PM       Radiology Ct Abdomen Pelvis Wo Contrast  Result Date: 04/13/2017 CLINICAL DATA:  83 year old female with epigastric abdominal pain, dizziness, nausea and vomiting since 0800 hours. EXAM: CT ABDOMEN AND PELVIS WITHOUT CONTRAST TECHNIQUE: Multidetector CT imaging of the abdomen and pelvis was performed following the standard protocol without IV contrast. COMPARISON:  CT Abdomen and Pelvis 09/24/2006. FINDINGS: Lower chest: Mild cardiomegaly has not significantly changed since 2008. No pericardial effusion. Chronic gastric hiatal hernia has mildly increased and is now moderate. Negative lung bases. No pleural effusion. Hepatobiliary: Surgically absent gallbladder as in 2008. Negative noncontrast liver. Pancreas: Fatty atrophy of the pancreas with no mass or inflammation evident. Spleen: Negative. Adrenals/Urinary Tract: Normal adrenal glands. Normal noncontrast CT appearance of both kidneys. Negative course of both ureters. Small pelvic phleboliths. There is nondependent gas in the urinary bladder (series 3, image 74) which is mildly distended but otherwise unremarkable. No perivesical stranding. Stomach/Bowel: Oral contrast has reached the rectum and is mixed with a stool ball there. Severe diverticulosis throughout the sigmoid colon, but no active inflammation. Severe diverticulosis continues throughout the left colon to the splenic flexure, no active inflammation. Moderate to  severe diverticulosis continues throughout the transverse colon to the hepatic flexure. No active inflammation. Mild to moderate diverticulosis of the right colon, no active inflammation. Negative appendix. Diverticulosis also in the terminal ileum (series 3, images 45 through 51). No distal small bowel thickening. No dilated small bowel loops. There occasional other small bowel diverticula demonstrated (jejunum on image 46). Negative stomach aside from the moderate-sized hiatal hernia. Negative duodenum. No abdominal free fluid or free air. Vascular/Lymphatic: Vascular patency is not evaluated in the absence of IV contrast. Aortoiliac calcified atherosclerosis. No lymphadenopathy. Reproductive: Negative. Other: No pelvic free fluid. Chronic soft tissue scarring a at the left  lower quadrant ventral abdomen is unchanged. No abdominal hernia identified. Musculoskeletal: Chronic grade 1 lower lumbar spondylolisthesis with facet arthropathy. Mild to moderate progression since 2008. No acute osseous abnormality identified. IMPRESSION: 1. Gas within the urinary bladder, suspicious for urinary tract infection unless explained by recent catheterization. 2. Severe diverticulosis throughout the colon, and also small bowel diverticulosis in the terminal ileum and occasionally the jejunum. However, there is no active inflammation or bowel obstruction identified. 3. Chronic gastric hiatal hernia has progressed since 2008 and is now moderate. Electronically Signed   By: Odessa Fleming M.D.   On: 04/13/2017 16:47    Procedures Procedures (including critical care time)  Medications Ordered in ED Medications  0.9 %  sodium chloride infusion ( Intravenous New Bag/Given 04/13/17 1327)  Barium Sulfate 2.1 % SUSP (not administered)  fentaNYL (SUBLIMAZE) injection 12.5 mcg (12.5 mcg Intravenous Given 04/13/17 1326)  ondansetron (ZOFRAN) injection 4 mg (4 mg Intravenous Given 04/13/17 1326)     Initial Impression / Assessment and Plan /  ED Course  I have reviewed the triage vital signs and the nursing notes.  Pertinent labs & imaging results that were available during my care of the patient were reviewed by me and considered in my medical decision making (see chart for details).  Update: Patient improved, no ongoing dizziness, no nausea.   Update: Patient remains improved, now having a bowel movement. CT results reassuring. I discussed the findings with patient and her daughter, including evidence for hiatal hernia.  Family corroborates that the patient has early satiety. Patient has no urinary complaints. With no ongoing nausea, vomiting, there is some suspicion for either hiatal hernia or gastric etiology for her symptoms given the reassuring CT scan. With general improvement, no hemodynamic instability, and no evidence for peritonitis or other acute new pathology, the patient we discharged with close outpatient follow-up.  Final Clinical Impressions(s) / ED Diagnoses  Abdominal pain nausea and vomiting Dizziness   Gerhard Munch, MD 04/13/17 1653    Gerhard Munch, MD 04/13/17 1704

## 2017-05-09 NOTE — Progress Notes (Signed)
Cardiology Office Note  Date: 05/10/2017   ID: [name], [name] [birth year], MRN [id number]  PCP: Assunta Found, MD  Primary Cardiologist: Nona Dell, MD   Chief Complaint  Patient presents with  . Coronary Artery Disease    History of Present Illness: [name] is an 83 y.o. female last seen in the office back in June, a former patient of Dr. Tresa Endo. She presents today with her daughter for a routine follow-up visit. From a cardiac perspective, she reports no angina symptoms and has had stable dyspnea on exertion. She does ADLs around her home, otherwise no regular exercise plan..  Record review finds ER evaluation in early August with abdominal discomfort, nausea and emesis, also dizziness. No definite acute findings were noted. She did have CT imaging indicating a chronic enlarging hiatal hernia and also severe diverticulosis without active inflammation. She has not seen a gastroenterologist but is interested in seeking consultation through her PCP.  I reviewed her medications which are stable from a cardiac perspective and outlined below. She does not report any spontaneous bleeding problems. No use of nitroglycerin.  Past Medical History:  Diagnosis Date  . Arthritis   . CKD (chronic kidney disease), stage III   . Complete heart block (HCC) 11/16/2016   a. transient during NSTEMI, resolved with revascularization.  . Coronary artery disease 11/2009   a. with prior stent placement to the ramus intermedius and RCA. b. NSTEMI 11/2016 s/p DES to DES to distal Cx with moderate residual dz.  . Essential hypertension   . Mixed hyperlipidemia   . Non-ST elevation (NSTEMI) myocardial infarction (HCC) 11/16/2016  . Obesity   . Reflux esophagitis   . Sleep apnea 09/27/2010   Untreated, REM 64.7/hr AHI 18.5/hr RDI 19.2/hr. Patuent refused CPAP therapy    Past Surgical History:  Procedure Laterality Date  . CHOLECYSTECTOMY    . CORONARY ANGIOPLASTY WITH STENT PLACEMENT   2007   A 3.0x22mm CYPHER stent post dilated to 3.29 mm the 100% occlusion was reduced to 0%  . CORONARY STENT INTERVENTION N/A 11/16/2016   Procedure: Coronary Stent Intervention;  Surgeon: Tonny Bollman, MD;  Location: [facility] INVASIVE CV LAB;  Service: Cardiovascular;  Laterality: N/A;  . LEFT HEART CATH AND CORONARY ANGIOGRAPHY N/A 11/16/2016   Procedure: Left Heart Cath and Coronary Angiography;  Surgeon: Tonny Bollman, MD;  Location: [facility] INVASIVE CV LAB;  Service: Cardiovascular;  Laterality: N/A;  . LEFT HEART CATHETERIZATION WITH CORONARY ANGIOGRAM N/A 07/12/2014   Procedure: LEFT HEART CATHETERIZATION WITH CORONARY ANGIOGRAM;  Surgeon: Micheline Chapman, MD;  Location: [facility] CATH LAB;  Service: Cardiovascular;  Laterality: N/A;  . TEMPORARY PACEMAKER N/A 11/16/2016   Procedure: Temporary Pacemaker;  Surgeon: Tonny Bollman, MD;  Location: [facility] INVASIVE CV LAB;  Service: Cardiovascular;  Laterality: N/A;    Current Outpatient Prescriptions  Medication Sig Dispense Refill  . acetaminophen (TYLENOL) 325 MG tablet Take 325-650 mg by mouth every 6 (six) hours as needed (for headaches).    Marland Kitchen amLODipine (NORVASC) 5 MG tablet Take 1 tablet (5 mg total) by mouth daily. 90 tablet 0  . aspirin 81 MG tablet Take 81 mg by mouth daily.    . fluticasone (FLONASE) 50 MCG/ACT nasal spray Place 1 spray into both nostrils as needed for allergies or rhinitis.     . furosemide (LASIX) 40 MG tablet TAKE 1 TABLET BY MOUTH DAILY AS NEEDED FOR EDEMA (Patient taking differently: TAKE 40mg  BY MOUTH DAILY AS NEEDED FOR EDEMA) 30 tablet  0  . hydrALAZINE (APRESOLINE) 100 MG tablet Take 50 mg by mouth 2 (two) times daily.    Marland Kitchen. losartan (COZAAR) 50 MG tablet Take 1 tablet (50 mg total) by mouth daily. 30 tablet 5  . nitroGLYCERIN (NITROSTAT) 0.4 MG SL tablet Place 1 tablet (0.4 mg total) under the tongue every 5 (five) minutes as needed. 25 tablet 3  . omeprazole (PRILOSEC) 20 MG capsule Take 1 capsule (20 mg total) by mouth daily.  Take one tablet daily 21 capsule 0  . pantoprazole (PROTONIX) 40 MG tablet Take 1 tablet (40 mg total) by mouth daily. 30 tablet 6  . rosuvastatin (CRESTOR) 40 MG tablet Take 1 tablet (40 mg total) by mouth daily. 90 tablet 3  . ticagrelor (BRILINTA) 90 MG TABS tablet Take 1 tablet (90 mg total) by mouth 2 (two) times daily. 180 tablet 3  . Vitamin D, Ergocalciferol, (DRISDOL) 50000 units CAPS capsule Take 50,000 Units by mouth every 30 (thirty) days.  0   No current facility-administered medications for this visit.    Allergies:  Atorvastatin; Contrast media [iodinated diagnostic agents]; Darvon [propoxyphene]; and Codeine   Social History: The patient  reports that she has never smoked. She has never used smokeless tobacco. She reports that she does not drink alcohol or use drugs.   ROS:  Please see the history of present illness. Otherwise, complete review of systems is positive for chronic arthritic pains.  All other systems are reviewed and negative.   Physical Exam: VS:  BP 136/82   Pulse 82   Ht 5\' 5"  (1.651 m)   Wt 185 lb (83.9 kg)   SpO2 98%   BMI 30.79 kg/m , BMI Body mass index is 30.79 kg/m.  Wt Readings from Last 3 Encounters:  05/10/17 185 lb (83.9 kg)  03/08/17 185 lb (83.9 kg)  01/03/17 185 lb (83.9 kg)    General: Overweight elderly woman, appears comfortable at rest. HEENT: Conjunctiva and lids normal, oropharynx clear. Neck: Supple, no elevated JVP or carotid bruits, no thyromegaly. Lungs: Clear to auscultation, nonlabored breathing at rest. Cardiac: Regular rate and rhythm, no S3, soft systolic murmur, no pericardial rub. Abdomen: Soft, nontender, bowel sounds present, no guarding or rebound. Extremities: No pitting edema, distal pulses 2+. Skin: Warm and dry. Musculoskeletal: No kyphosis. Neuropsychiatric: Alert and oriented x3, affect grossly appropriate.  ECG: I personally reviewed the tracing from 04/13/2017 which showed sinus rhythm with sinus  arrhythmia, right bundle branch block, left anterior fascicular block, nonspecific ST changes.  Recent Labwork: 04/13/2017: ALT 12; AST 19; BUN 14; Creatinine, Ser 1.27; Hemoglobin 11.7; Platelets 199; Potassium 3.9; Sodium 138     Component Value Date/Time   CHOL 116 02/28/2017 0717   CHOL 146 01/19/2015 1048   TRIG 143 02/28/2017 0717   HDL 40 (L) 02/28/2017 0717   HDL 48 01/19/2015 1048   CHOLHDL 2.9 02/28/2017 0717   VLDL 29 02/28/2017 0717   LDLCALC 47 02/28/2017 0717   LDLCALC 77 01/19/2015 1048    Other Studies Reviewed Today:  Echocardiogram 11/18/2016: Study Conclusions  - Left ventricle: The cavity size was normal. Wall thickness was   increased in a pattern of severe LVH. Systolic function was   vigorous. The estimated ejection fraction was in the range of 65%   to 70%. Wall motion was normal; there were no regional wall   motion abnormalities. Doppler parameters are consistent with   abnormal left ventricular relaxation (grade 1 diastolic   dysfunction). - Aortic valve:  Mildly calcified annulus. Trileaflet; mildly   calcified leaflets. There was mild regurgitation. Mean gradient   (S): 8 mm Hg. Peak gradient (S): 17 mm Hg. VTI ratio of LVOT to   aortic valve: 0.71. - Mitral valve: Calcified annulus. There was mild regurgitation. - Right atrium: Central venous pressure (est): 3 mm Hg. - Atrial septum: No defect or patent foramen ovale was identified. - Tricuspid valve: There was trivial regurgitation. - Pulmonary arteries: PA peak pressure: 22 mm Hg (S). - Pericardium, extracardiac: A small pericardial effusion was   identified circumferential to the heart. Somewhat larger   collection anterior to the apical right ventricle. No obvious   hemodynamic compromise. With recent temporary transvenous   pacemaker in place, consider a follow-up study to reassess.  Impressions:  - Severe LVH with LVEF 65-70% and grade 1 diastolic dysfunction.   Mild mitral  regurgitation. Sclerotic aortic valve with mild   aortic regurgitation. Trivial tricuspid regurgitation with PASP   22 mmHg. A small pericardial effusion was identified   circumferential to the heart. Somewhat larger collection anterior   to the apical right ventricle. No obvious hemodynamic compromise.   With recent temporary transvenous pacemaker in place, consider a   follow-up study to reassess.  Assessment and Plan:  1. CAD status post DES to the distal circumflex in March of this year the setting of NSTEMI. She does not report any active angina at this time and we will continue with medical therapy. I encouraged walking for exercise as tolerated. She is limited by arthritic pain.  2. Hyperlipidemia, on Crestor. She follows with Dr. Phillips Odor. Last LDL was 47.  3. Essential hypertension, blood pressure is adequately controlled today. No changes were made in regimen.  4. CKD stage III, last creatinine 1.27.  5. Intermittent nausea and abdominal discomfort. She has a moderate-sized hiatal hernia and also diverticulosis. She is considering obtaining a referral from PCP to gastroenterology. Continues on Protonix.  Current medicines were reviewed with the patient today.  Disposition: Follow-up in 4 months.  Signed, Jonelle Sidle, MD, [facility] 05/10/2017 2:24 PM    [facility] at [facility] [address], [place], [place] [address] Phone: [phone]; Fax: [phone]

## 2017-05-10 ENCOUNTER — Encounter: Payer: Self-pay | Admitting: Cardiology

## 2017-05-10 ENCOUNTER — Ambulatory Visit (INDEPENDENT_AMBULATORY_CARE_PROVIDER_SITE_OTHER): Payer: Medicare Other | Admitting: Cardiology

## 2017-05-10 VITALS — BP 136/82 | HR 82 | Ht 65.0 in | Wt 185.0 lb

## 2017-05-10 DIAGNOSIS — N183 Chronic kidney disease, stage 3 unspecified: Secondary | ICD-10-CM

## 2017-05-10 DIAGNOSIS — E782 Mixed hyperlipidemia: Secondary | ICD-10-CM | POA: Diagnosis not present

## 2017-05-10 DIAGNOSIS — Z9861 Coronary angioplasty status: Secondary | ICD-10-CM | POA: Diagnosis not present

## 2017-05-10 DIAGNOSIS — I2510 Atherosclerotic heart disease of native coronary artery without angina pectoris: Secondary | ICD-10-CM

## 2017-05-10 DIAGNOSIS — I10 Essential (primary) hypertension: Secondary | ICD-10-CM

## 2017-05-10 NOTE — Patient Instructions (Signed)
Your physician recommends that you schedule a follow-up appointment in:  4 months with Dr McDowell    Your physician recommends that you continue on your current medications as directed. Please refer to the Current Medication list given to you today.    If you need a refill on your cardiac medications before your next appointment, please call your pharmacy.     No lab work or tests ordered today.     Thank you for choosing [facility] !         

## 2017-05-14 ENCOUNTER — Other Ambulatory Visit: Payer: Self-pay | Admitting: Cardiology

## 2017-05-15 DIAGNOSIS — E6609 Other obesity due to excess calories: Secondary | ICD-10-CM | POA: Diagnosis not present

## 2017-05-15 DIAGNOSIS — J069 Acute upper respiratory infection, unspecified: Secondary | ICD-10-CM | POA: Diagnosis not present

## 2017-05-15 DIAGNOSIS — J029 Acute pharyngitis, unspecified: Secondary | ICD-10-CM | POA: Diagnosis not present

## 2017-05-15 DIAGNOSIS — Z6830 Body mass index (BMI) 30.0-30.9, adult: Secondary | ICD-10-CM | POA: Diagnosis not present

## 2017-06-13 DIAGNOSIS — M170 Bilateral primary osteoarthritis of knee: Secondary | ICD-10-CM | POA: Diagnosis not present

## 2017-06-13 DIAGNOSIS — M25561 Pain in right knee: Secondary | ICD-10-CM | POA: Diagnosis not present

## 2017-06-13 DIAGNOSIS — M25562 Pain in left knee: Secondary | ICD-10-CM | POA: Diagnosis not present

## 2017-06-24 DIAGNOSIS — J019 Acute sinusitis, unspecified: Secondary | ICD-10-CM | POA: Diagnosis not present

## 2017-06-24 DIAGNOSIS — Z6829 Body mass index (BMI) 29.0-29.9, adult: Secondary | ICD-10-CM | POA: Diagnosis not present

## 2017-06-24 DIAGNOSIS — E663 Overweight: Secondary | ICD-10-CM | POA: Diagnosis not present

## 2017-06-24 DIAGNOSIS — R252 Cramp and spasm: Secondary | ICD-10-CM | POA: Diagnosis not present

## 2017-06-25 ENCOUNTER — Other Ambulatory Visit: Payer: Self-pay | Admitting: Physician Assistant

## 2017-06-27 ENCOUNTER — Other Ambulatory Visit: Payer: Self-pay | Admitting: Cardiology

## 2017-06-27 MED ORDER — PANTOPRAZOLE SODIUM 40 MG PO TBEC: 40.0000 mg | DELAYED_RELEASE_TABLET | Freq: Every day | ORAL | 6 refills | Status: DC

## 2017-06-27 NOTE — Telephone Encounter (Signed)
Needs Provstatin sent to pharmacy/tg

## 2017-06-27 NOTE — Telephone Encounter (Signed)
Pt wanted protonix , not pravastatin

## 2017-07-02 ENCOUNTER — Other Ambulatory Visit: Payer: Self-pay | Admitting: *Deleted

## 2017-07-02 MED ORDER — LOSARTAN POTASSIUM 50 MG PO TABS: 50.0000 mg | ORAL_TABLET | Freq: Every day | ORAL | 5 refills | Status: DC

## 2017-07-03 DIAGNOSIS — J209 Acute bronchitis, unspecified: Secondary | ICD-10-CM | POA: Diagnosis not present

## 2017-07-03 DIAGNOSIS — Z6830 Body mass index (BMI) 30.0-30.9, adult: Secondary | ICD-10-CM | POA: Diagnosis not present

## 2017-07-03 DIAGNOSIS — H40013 Open angle with borderline findings, low risk, bilateral: Secondary | ICD-10-CM | POA: Diagnosis not present

## 2017-07-03 DIAGNOSIS — J189 Pneumonia, unspecified organism: Secondary | ICD-10-CM | POA: Diagnosis not present

## 2017-07-03 DIAGNOSIS — E6609 Other obesity due to excess calories: Secondary | ICD-10-CM | POA: Diagnosis not present

## 2017-07-03 DIAGNOSIS — H02844 Edema of left upper eyelid: Secondary | ICD-10-CM | POA: Diagnosis not present

## 2017-08-21 DIAGNOSIS — N2581 Secondary hyperparathyroidism of renal origin: Secondary | ICD-10-CM | POA: Diagnosis not present

## 2017-08-21 DIAGNOSIS — R809 Proteinuria, unspecified: Secondary | ICD-10-CM | POA: Diagnosis not present

## 2017-08-21 DIAGNOSIS — I129 Hypertensive chronic kidney disease with stage 1 through stage 4 chronic kidney disease, or unspecified chronic kidney disease: Secondary | ICD-10-CM | POA: Diagnosis not present

## 2017-08-21 DIAGNOSIS — N183 Chronic kidney disease, stage 3 (moderate): Secondary | ICD-10-CM | POA: Diagnosis not present

## 2017-08-29 ENCOUNTER — Other Ambulatory Visit: Payer: Self-pay

## 2017-08-29 MED ORDER — ROSUVASTATIN CALCIUM 40 MG PO TABS: 40.0000 mg | ORAL_TABLET | Freq: Every day | ORAL | 3 refills | Status: DC

## 2017-09-18 DIAGNOSIS — M170 Bilateral primary osteoarthritis of knee: Secondary | ICD-10-CM | POA: Diagnosis not present

## 2017-09-18 DIAGNOSIS — M21161 Varus deformity, not elsewhere classified, right knee: Secondary | ICD-10-CM | POA: Diagnosis not present

## 2017-09-18 DIAGNOSIS — M25561 Pain in right knee: Secondary | ICD-10-CM | POA: Diagnosis not present

## 2017-09-18 DIAGNOSIS — M25562 Pain in left knee: Secondary | ICD-10-CM | POA: Diagnosis not present

## 2017-09-24 DIAGNOSIS — M25562 Pain in left knee: Secondary | ICD-10-CM | POA: Diagnosis not present

## 2017-09-24 DIAGNOSIS — M170 Bilateral primary osteoarthritis of knee: Secondary | ICD-10-CM | POA: Diagnosis not present

## 2017-09-24 DIAGNOSIS — M25561 Pain in right knee: Secondary | ICD-10-CM | POA: Diagnosis not present

## 2017-09-26 ENCOUNTER — Other Ambulatory Visit: Payer: Self-pay | Admitting: Physician Assistant

## 2017-09-26 NOTE — Progress Notes (Signed)
Cardiology Office Note  Date: 09/27/2017   ID: [name], DOB 1934-04-04, MRN [id number]  PCP: Assunta Found, MD  Primary Cardiologist: Nona Dell, MD   Chief Complaint  Patient presents with  . Coronary Artery Disease    History of Present Illness: [name] is an 84 y.o. female last seen in August 2018. She is here today with her daughter for a follow-up visit. From a cardiac perspective, she is not reporting any angina symptoms or increasing dyspnea on exertion. She is bothered by chronic arthritic knee pain, but has had some recent injections with better pain control. She states that she had to go to a function recently and was able to walk more freely and rested better that evening.  We went over her medications which are outlined below in stable from a cardiac perspective. She reports no bleeding problems on aspirin and Brilinta. We discussed continuing dual antiplatelet therapy longer term, going past her 1 year course from last DES which would be in March.  She continues to follow with [facility]. LDL control has been good on high-dose Crestor which she is tolerating.  Past Medical History:  Diagnosis Date  . Arthritis   . CKD (chronic kidney disease), stage III (HCC)   . Complete heart block (HCC) 11/16/2016   a. transient during NSTEMI, resolved with revascularization.  . Coronary artery disease 11/2009   a. with prior stent placement to the ramus intermedius and RCA. b. NSTEMI 11/2016 s/p DES to DES to distal Cx with moderate residual dz.  . Essential hypertension   . Mixed hyperlipidemia   . Non-ST elevation (NSTEMI) myocardial infarction (HCC) 11/16/2016  . Obesity   . Reflux esophagitis   . Sleep apnea 09/27/2010   Untreated, REM 64.7/hr AHI 18.5/hr RDI 19.2/hr. Patuent refused CPAP therapy    Past Surgical History:  Procedure Laterality Date  . CHOLECYSTECTOMY    . CORONARY ANGIOPLASTY WITH STENT PLACEMENT  2007   A 3.0x58mm CYPHER stent post  dilated to 3.29 mm the 100% occlusion was reduced to 0%  . CORONARY STENT INTERVENTION N/A 11/16/2016   Procedure: Coronary Stent Intervention;  Surgeon: Tonny Bollman, MD;  Location: [facility] INVASIVE CV LAB;  Service: Cardiovascular;  Laterality: N/A;  . LEFT HEART CATH AND CORONARY ANGIOGRAPHY N/A 11/16/2016   Procedure: Left Heart Cath and Coronary Angiography;  Surgeon: Tonny Bollman, MD;  Location: [facility] INVASIVE CV LAB;  Service: Cardiovascular;  Laterality: N/A;  . LEFT HEART CATHETERIZATION WITH CORONARY ANGIOGRAM N/A 07/12/2014   Procedure: LEFT HEART CATHETERIZATION WITH CORONARY ANGIOGRAM;  Surgeon: Micheline Chapman, MD;  Location: [facility] CATH LAB;  Service: Cardiovascular;  Laterality: N/A;  . TEMPORARY PACEMAKER N/A 11/16/2016   Procedure: Temporary Pacemaker;  Surgeon: Tonny Bollman, MD;  Location: [facility] INVASIVE CV LAB;  Service: Cardiovascular;  Laterality: N/A;    Current Outpatient Medications  Medication Sig Dispense Refill  . acetaminophen (TYLENOL) 325 MG tablet Take 325-650 mg by mouth every 6 (six) hours as needed (for headaches).    Marland Kitchen amLODipine (NORVASC) 5 MG tablet TAKE 1 TABLET BY MOUTH EVERY DAY 90 tablet 3  . aspirin 81 MG tablet Take 81 mg by mouth daily.    Marland Kitchen BRILINTA 90 MG TABS tablet TAKE 1 TABLET BY MOUTH TWICE A DAY 180 tablet 2  . fluticasone (FLONASE) 50 MCG/ACT nasal spray Place 1 spray into both nostrils as needed for allergies or rhinitis.     . furosemide (LASIX) 40 MG tablet TAKE 1 TABLET  BY MOUTH DAILY AS NEEDED FOR EDEMA (Patient taking differently: TAKE 40mg  BY MOUTH DAILY AS NEEDED FOR EDEMA) 30 tablet 0  . hydrALAZINE (APRESOLINE) 100 MG tablet Take 50 mg by mouth 2 (two) times daily.    Marland Kitchen losartan (COZAAR) 50 MG tablet Take 1 tablet (50 mg total) by mouth daily. 30 tablet 5  . nitroGLYCERIN (NITROSTAT) 0.4 MG SL tablet Place 1 tablet (0.4 mg total) under the tongue every 5 (five) minutes as needed. 25 tablet 3  . omeprazole (PRILOSEC) 20 MG capsule Take 1 capsule  (20 mg total) by mouth daily. Take one tablet daily 21 capsule 0  . pantoprazole (PROTONIX) 40 MG tablet Take 1 tablet (40 mg total) by mouth daily. 30 tablet 6  . rosuvastatin (CRESTOR) 40 MG tablet Take 1 tablet (40 mg total) by mouth daily. 90 tablet 3  . Vitamin D, Ergocalciferol, (DRISDOL) 50000 units CAPS capsule Take 50,000 Units by mouth every 30 (thirty) days.  0   No current facility-administered medications for this visit.    Allergies:  Atorvastatin; Contrast media [iodinated diagnostic agents]; Darvon [propoxyphene]; and Codeine   Social History: The patient  reports that  has never smoked. she has never used smokeless tobacco. She reports that she does not drink alcohol or use drugs.   ROS:  Please see the history of present illness. Otherwise, complete review of systems is positive for chronic arthritic pains.  All other systems are reviewed and negative.   Physical Exam: VS:  BP 128/74   Pulse 82   Ht 5\' 5"  (1.651 m)   Wt 186 lb 3.2 oz (84.5 kg)   SpO2 98%   BMI 30.99 kg/m , BMI Body mass index is 30.99 kg/m.  Wt Readings from Last 3 Encounters:  09/27/17 186 lb 3.2 oz (84.5 kg)  05/10/17 185 lb (83.9 kg)  03/08/17 185 lb (83.9 kg)    General: Elderly woman, appears comfortable at rest. HEENT: Conjunctiva and lids normal, oropharynx clear. Neck: Supple, no elevated JVP or carotid bruits, no thyromegaly. Lungs: Clear to auscultation, nonlabored breathing at rest. Cardiac: Regular rate and rhythm, no S3, soft systolic murmur. Abdomen: Soft, nontender, bowel sounds present. Extremities: No pitting edema, distal pulses 2+. Skin: Warm and dry. Musculoskeletal: No kyphosis. Neuropsychiatric: Alert and oriented x3, affect grossly appropriate.  ECG: I personally reviewed the tracing from 04/13/2017 which showed sinus arrhythmia with right bundle branch block and left anterior fascicular block.  Recent Labwork: 04/13/2017: ALT 12; AST 19; BUN 14; Creatinine, Ser 1.27;  Hemoglobin 11.7; Platelets 199; Potassium 3.9; Sodium 138     Component Value Date/Time   CHOL 116 02/28/2017 0717   CHOL 146 01/19/2015 1048   TRIG 143 02/28/2017 0717   HDL 40 (L) 02/28/2017 0717   HDL 48 01/19/2015 1048   CHOLHDL 2.9 02/28/2017 0717   VLDL 29 02/28/2017 0717   LDLCALC 47 02/28/2017 0717   LDLCALC 77 01/19/2015 1048    Other Studies Reviewed Today:  Echocardiogram 11/18/2016: Study Conclusions  - Left ventricle: The cavity size was normal. Wall thickness was   increased in a pattern of severe LVH. Systolic function was   vigorous. The estimated ejection fraction was in the range of 65%   to 70%. Wall motion was normal; there were no regional wall   motion abnormalities. Doppler parameters are consistent with   abnormal left ventricular relaxation (grade 1 diastolic   dysfunction). - Aortic valve: Mildly calcified annulus. Trileaflet; mildly   calcified leaflets. There  was mild regurgitation. Mean gradient   (S): 8 mm Hg. Peak gradient (S): 17 mm Hg. VTI ratio of LVOT to   aortic valve: 0.71. - Mitral valve: Calcified annulus. There was mild regurgitation. - Right atrium: Central venous pressure (est): 3 mm Hg. - Atrial septum: No defect or patent foramen ovale was identified. - Tricuspid valve: There was trivial regurgitation. - Pulmonary arteries: PA peak pressure: 22 mm Hg (S). - Pericardium, extracardiac: A small pericardial effusion was   identified circumferential to the heart. Somewhat larger   collection anterior to the apical right ventricle. No obvious   hemodynamic compromise. With recent temporary transvenous   pacemaker in place, consider a follow-up study to reassess.  Impressions:  - Severe LVH with LVEF 65-70% and grade 1 diastolic dysfunction.   Mild mitral regurgitation. Sclerotic aortic valve with mild   aortic regurgitation. Trivial tricuspid regurgitation with PASP   22 mmHg. A small pericardial effusion was identified    circumferential to the heart. Somewhat larger collection anterior   to the apical right ventricle. No obvious hemodynamic compromise.   With recent temporary transvenous pacemaker in place, consider a   follow-up study to reassess.  Cardiac catheterization and PCI 11/16/2016: 1. Critical stenosis of the distal circumflex treated successfully with PCI using a 2.5 mm drug-eluting stent 2. Moderate mid LAD stenosis, does not appear to be flow-obstructive 3. Moderate proximal RCA stenosis (nondominant vessel) 4. Patent stents in the RCA and ramus intermedius 5. Normal LVEDP-will assess LV function by echo because of chronic kidney disease 6. Successful insertion of a temporary transvenous pacing wire for treatment of complete heart block  Assessment and Plan:  1. Symptomatic stable CAD status post DES to the distal circumflex in March 2018. She is doing well on medical therapy and we will plan to continue long-term dual antiplatelet regimen. Encouraged regular walking for exercise as tolerated.  2. Hyperlipidemia on Crestor. LDL has been well controlled. [facility].  3. Essential hypertension, blood pressure is adequately controlled today.  4. CKD stage III, last creatinine 1.27.  Current medicines were reviewed with the patient today.  Disposition: Follow-up in 6 months.  Signed, Jonelle SidleSamuel G. Denelle Capurro, MD, [facility] 09/27/2017 1:45 PM    [facility] [address], [place], [place] [address] Phone: [phone]; Fax: [phone]

## 2017-09-27 ENCOUNTER — Encounter: Payer: Self-pay | Admitting: Cardiology

## 2017-09-27 ENCOUNTER — Ambulatory Visit (INDEPENDENT_AMBULATORY_CARE_PROVIDER_SITE_OTHER): Payer: Medicare Other | Admitting: Cardiology

## 2017-09-27 VITALS — BP 128/74 | HR 82 | Ht 65.0 in | Wt 186.2 lb

## 2017-09-27 DIAGNOSIS — E782 Mixed hyperlipidemia: Secondary | ICD-10-CM | POA: Diagnosis not present

## 2017-09-27 DIAGNOSIS — N183 Chronic kidney disease, stage 3 unspecified: Secondary | ICD-10-CM

## 2017-09-27 DIAGNOSIS — I10 Essential (primary) hypertension: Secondary | ICD-10-CM | POA: Diagnosis not present

## 2017-09-27 DIAGNOSIS — I25119 Atherosclerotic heart disease of native coronary artery with unspecified angina pectoris: Secondary | ICD-10-CM | POA: Diagnosis not present

## 2017-09-27 DIAGNOSIS — I209 Angina pectoris, unspecified: Secondary | ICD-10-CM

## 2017-09-27 NOTE — Patient Instructions (Signed)

## 2017-10-02 DIAGNOSIS — M25561 Pain in right knee: Secondary | ICD-10-CM | POA: Diagnosis not present

## 2017-10-02 DIAGNOSIS — M170 Bilateral primary osteoarthritis of knee: Secondary | ICD-10-CM | POA: Diagnosis not present

## 2017-10-02 DIAGNOSIS — M25562 Pain in left knee: Secondary | ICD-10-CM | POA: Diagnosis not present

## 2017-10-16 DIAGNOSIS — M25561 Pain in right knee: Secondary | ICD-10-CM | POA: Diagnosis not present

## 2017-10-16 DIAGNOSIS — M170 Bilateral primary osteoarthritis of knee: Secondary | ICD-10-CM | POA: Diagnosis not present

## 2017-10-16 DIAGNOSIS — M25562 Pain in left knee: Secondary | ICD-10-CM | POA: Diagnosis not present

## 2017-10-24 ENCOUNTER — Other Ambulatory Visit: Payer: Self-pay | Admitting: Cardiovascular Disease

## 2017-10-25 NOTE — Telephone Encounter (Signed)
Rx has been sent to the pharmacy electronically. ° °

## 2017-12-18 ENCOUNTER — Other Ambulatory Visit: Payer: Self-pay | Admitting: Cardiovascular Disease

## 2017-12-18 NOTE — Telephone Encounter (Signed)
REFILL 

## 2017-12-26 ENCOUNTER — Other Ambulatory Visit: Payer: Self-pay | Admitting: Cardiology

## 2018-01-20 ENCOUNTER — Other Ambulatory Visit: Payer: Self-pay | Admitting: Cardiology

## 2018-01-22 DIAGNOSIS — M170 Bilateral primary osteoarthritis of knee: Secondary | ICD-10-CM | POA: Diagnosis not present

## 2018-01-22 DIAGNOSIS — M25461 Effusion, right knee: Secondary | ICD-10-CM | POA: Diagnosis not present

## 2018-01-22 DIAGNOSIS — H401132 Primary open-angle glaucoma, bilateral, moderate stage: Secondary | ICD-10-CM | POA: Diagnosis not present

## 2018-01-22 DIAGNOSIS — M25561 Pain in right knee: Secondary | ICD-10-CM | POA: Diagnosis not present

## 2018-01-22 DIAGNOSIS — H2513 Age-related nuclear cataract, bilateral: Secondary | ICD-10-CM | POA: Diagnosis not present

## 2018-01-22 DIAGNOSIS — M25462 Effusion, left knee: Secondary | ICD-10-CM | POA: Diagnosis not present

## 2018-01-22 DIAGNOSIS — M25562 Pain in left knee: Secondary | ICD-10-CM | POA: Diagnosis not present

## 2018-01-23 DIAGNOSIS — J019 Acute sinusitis, unspecified: Secondary | ICD-10-CM | POA: Diagnosis not present

## 2018-01-23 DIAGNOSIS — Z1389 Encounter for screening for other disorder: Secondary | ICD-10-CM | POA: Diagnosis not present

## 2018-01-23 DIAGNOSIS — Z6830 Body mass index (BMI) 30.0-30.9, adult: Secondary | ICD-10-CM | POA: Diagnosis not present

## 2018-01-23 DIAGNOSIS — E6609 Other obesity due to excess calories: Secondary | ICD-10-CM | POA: Diagnosis not present

## 2018-01-23 DIAGNOSIS — R195 Other fecal abnormalities: Secondary | ICD-10-CM | POA: Diagnosis not present

## 2018-01-28 DIAGNOSIS — R350 Frequency of micturition: Secondary | ICD-10-CM | POA: Diagnosis not present

## 2018-01-28 DIAGNOSIS — Z6830 Body mass index (BMI) 30.0-30.9, adult: Secondary | ICD-10-CM | POA: Diagnosis not present

## 2018-01-28 DIAGNOSIS — N342 Other urethritis: Secondary | ICD-10-CM | POA: Diagnosis not present

## 2018-01-28 DIAGNOSIS — E6609 Other obesity due to excess calories: Secondary | ICD-10-CM | POA: Diagnosis not present

## 2018-02-19 DIAGNOSIS — N183 Chronic kidney disease, stage 3 (moderate): Secondary | ICD-10-CM | POA: Diagnosis not present

## 2018-02-19 DIAGNOSIS — R809 Proteinuria, unspecified: Secondary | ICD-10-CM | POA: Diagnosis not present

## 2018-02-19 DIAGNOSIS — N390 Urinary tract infection, site not specified: Secondary | ICD-10-CM | POA: Diagnosis not present

## 2018-02-19 DIAGNOSIS — I129 Hypertensive chronic kidney disease with stage 1 through stage 4 chronic kidney disease, or unspecified chronic kidney disease: Secondary | ICD-10-CM | POA: Diagnosis not present

## 2018-02-19 DIAGNOSIS — N2581 Secondary hyperparathyroidism of renal origin: Secondary | ICD-10-CM | POA: Diagnosis not present

## 2018-02-26 DIAGNOSIS — H401132 Primary open-angle glaucoma, bilateral, moderate stage: Secondary | ICD-10-CM | POA: Diagnosis not present

## 2018-02-26 DIAGNOSIS — H2513 Age-related nuclear cataract, bilateral: Secondary | ICD-10-CM | POA: Diagnosis not present

## 2018-03-15 ENCOUNTER — Other Ambulatory Visit: Payer: Self-pay | Admitting: Cardiology

## 2018-03-26 DIAGNOSIS — Z0001 Encounter for general adult medical examination with abnormal findings: Secondary | ICD-10-CM | POA: Diagnosis not present

## 2018-03-26 DIAGNOSIS — Z1389 Encounter for screening for other disorder: Secondary | ICD-10-CM | POA: Diagnosis not present

## 2018-03-26 DIAGNOSIS — E6609 Other obesity due to excess calories: Secondary | ICD-10-CM | POA: Diagnosis not present

## 2018-03-26 DIAGNOSIS — Z6831 Body mass index (BMI) 31.0-31.9, adult: Secondary | ICD-10-CM | POA: Diagnosis not present

## 2018-04-01 NOTE — Progress Notes (Signed)
Cardiology Office Note  Date: 04/02/2018   ID: [name], DOB [birth year], MRN [id number]  PCP: Assunta Found, MD  Primary Cardiologist: Nona Dell, MD   Chief Complaint  Patient presents with  . Coronary Artery Disease    History of Present Illness: [name] is an 84 y.o. female last seen in January.  She is here with her daughter for a routine follow-up visit.  She does not report any angina symptoms or nitroglycerin use since last encounter.  She does not exercise regularly from an aerobic perspective, limited by chronic arthritic knee pain.  She has been doing some stretching exercises at home.  Current cardiac regimen includes aspirin, Brilinta, Norvasc, Lasix, hydralazine, Cozaar, and Crestor.  He does not report any spontaneous bleeding problems.  We have discussed dual antiplatelet therapy, she has been most comfortable continuing.  I personally reviewed her ECG today which shows sinus rhythm with incomplete right bundle branch block and left anterior fascicular block.  She follows with nephrology in [place].  Also reports having an interval wellness exam with lab work per PCP.  Past Medical History:  Diagnosis Date  . Arthritis   . CKD (chronic kidney disease), stage III (HCC)   . Complete heart block (HCC) 11/16/2016   a. transient during NSTEMI, resolved with revascularization.  . Coronary artery disease 11/2009   a. with prior stent placement to the ramus intermedius and RCA. b. NSTEMI 11/2016 s/p DES to DES to distal Cx with moderate residual dz.  . Essential hypertension   . Mixed hyperlipidemia   . Non-ST elevation (NSTEMI) myocardial infarction (HCC) 11/16/2016  . Obesity   . Reflux esophagitis   . Sleep apnea 09/27/2010   Untreated, REM 64.7/hr AHI 18.5/hr RDI 19.2/hr. Patuent refused CPAP therapy    Past Surgical History:  Procedure Laterality Date  . CHOLECYSTECTOMY    . CORONARY ANGIOPLASTY WITH STENT PLACEMENT  2007   A 3.0x73mm  CYPHER stent post dilated to 3.29 mm the 100% occlusion was reduced to 0%  . CORONARY STENT INTERVENTION N/A 11/16/2016   Procedure: Coronary Stent Intervention;  Surgeon: Tonny Bollman, MD;  Location: [facility] INVASIVE CV LAB;  Service: Cardiovascular;  Laterality: N/A;  . LEFT HEART CATH AND CORONARY ANGIOGRAPHY N/A 11/16/2016   Procedure: Left Heart Cath and Coronary Angiography;  Surgeon: Tonny Bollman, MD;  Location: [facility] INVASIVE CV LAB;  Service: Cardiovascular;  Laterality: N/A;  . LEFT HEART CATHETERIZATION WITH CORONARY ANGIOGRAM N/A 07/12/2014   Procedure: LEFT HEART CATHETERIZATION WITH CORONARY ANGIOGRAM;  Surgeon: Micheline Chapman, MD;  Location: [facility] CATH LAB;  Service: Cardiovascular;  Laterality: N/A;  . TEMPORARY PACEMAKER N/A 11/16/2016   Procedure: Temporary Pacemaker;  Surgeon: Tonny Bollman, MD;  Location: [facility] INVASIVE CV LAB;  Service: Cardiovascular;  Laterality: N/A;    Current Outpatient Medications  Medication Sig Dispense Refill  . acetaminophen (TYLENOL) 325 MG tablet Take 325-650 mg by mouth every 6 (six) hours as needed (for headaches).    Marland Kitchen amLODipine (NORVASC) 5 MG tablet TAKE 1 TABLET BY MOUTH EVERY DAY 90 tablet 3  . aspirin 81 MG tablet Take 81 mg by mouth daily.    Marland Kitchen BRILINTA 90 MG TABS tablet TAKE 1 TABLET BY MOUTH TWICE A DAY 180 tablet 2  . fluticasone (FLONASE) 50 MCG/ACT nasal spray Place 1 spray into both nostrils as needed for allergies or rhinitis.     . furosemide (LASIX) 40 MG tablet TAKE 1 TABLET BY MOUTH DAILY AS NEEDED FOR  EDEMA (Patient taking differently: TAKE 40mg  BY MOUTH DAILY AS NEEDED FOR EDEMA) 30 tablet 0  . hydrALAZINE (APRESOLINE) 50 MG tablet Take 1 tablet (50 mg total) by mouth 2 (two) times daily. 180 tablet 3  . losartan (COZAAR) 50 MG tablet TAKE 1 TABLET BY MOUTH EVERY DAY 30 tablet 9  . nitroGLYCERIN (NITROSTAT) 0.4 MG SL tablet Place 1 tablet (0.4 mg total) under the tongue every 5 (five) minutes as needed. 25 tablet 3  . omeprazole  (PRILOSEC) 20 MG capsule Take 1 capsule (20 mg total) by mouth daily. Take one tablet daily 21 capsule 0  . pantoprazole (PROTONIX) 40 MG tablet TAKE 1 TABLET BY MOUTH EVERY DAY 30 tablet 5  . rosuvastatin (CRESTOR) 40 MG tablet Take 1 tablet (40 mg total) by mouth daily. 90 tablet 3  . Vitamin D, Ergocalciferol, (DRISDOL) 50000 units CAPS capsule Take 50,000 Units by mouth every 30 (thirty) days.  0   No current facility-administered medications for this visit.    Allergies:  Atorvastatin; Contrast media [iodinated diagnostic agents]; Darvon [propoxyphene]; and Codeine   Social History: The patient  reports that she has never smoked. She has never used smokeless tobacco. She reports that she does not drink alcohol or use drugs.   ROS:  Please see the history of present illness. Otherwise, complete review of systems is positive for chronic arthritic pain.  All other systems are reviewed and negative.   Physical Exam: VS:  BP 140/70   Pulse 76   Ht 5\' 5"  (1.651 m)   Wt 187 lb 3.2 oz (84.9 kg)   SpO2 98% Comment: on room air  BMI 31.15 kg/m , BMI Body mass index is 31.15 kg/m.  Wt Readings from Last 3 Encounters:  04/02/18 187 lb 3.2 oz (84.9 kg)  09/27/17 186 lb 3.2 oz (84.5 kg)  05/10/17 185 lb (83.9 kg)    General: Elderly woman, appears comfortable at rest. HEENT: Conjunctiva and lids normal, oropharynx clear. Neck: Supple, no elevated JVP or carotid bruits, no thyromegaly. Lungs: Clear to auscultation, nonlabored breathing at rest. Cardiac: Regular rate and rhythm, no S3, soft systolic murmur. Abdomen: Soft, nontender, bowel sounds present. Extremities: No pitting edema, distal pulses 2+. Skin: Warm and dry. Musculoskeletal: No kyphosis. Neuropsychiatric: Alert and oriented x3, affect grossly appropriate.  ECG: I personally reviewed the tracing from 04/13/2017 which showed sinus arrhythmia with right bundle branch block and left anterior fascicular block.  Recent  Labwork: 04/13/2017: ALT 12; AST 19; BUN 14; Creatinine, Ser 1.27; Hemoglobin 11.7; Platelets 199; Potassium 3.9; Sodium 138     Component Value Date/Time   CHOL 116 02/28/2017 0717   CHOL 146 01/19/2015 1048   TRIG 143 02/28/2017 0717   HDL 40 (L) 02/28/2017 0717   HDL 48 01/19/2015 1048   CHOLHDL 2.9 02/28/2017 0717   VLDL 29 02/28/2017 0717   LDLCALC 47 02/28/2017 0717   LDLCALC 77 01/19/2015 1048    Other Studies Reviewed Today:  Echocardiogram 11/18/2016: Study Conclusions  - Left ventricle: The cavity size was normal. Wall thickness was increased in a pattern of severe LVH. Systolic function was vigorous. The estimated ejection fraction was in the range of 65% to 70%. Wall motion was normal; there were no regional wall motion abnormalities. Doppler parameters are consistent with abnormal left ventricular relaxation (grade 1 diastolic dysfunction). - Aortic valve: Mildly calcified annulus. Trileaflet; mildly calcified leaflets. There was mild regurgitation. Mean gradient (S): 8 mm Hg. Peak gradient (S): 17 mm Hg.  VTI ratio of LVOT to aortic valve: 0.71. - Mitral valve: Calcified annulus. There was mild regurgitation. - Right atrium: Central venous pressure (est): 3 mm Hg. - Atrial septum: No defect or patent foramen ovale was identified. - Tricuspid valve: There was trivial regurgitation. - Pulmonary arteries: PA peak pressure: 22 mm Hg (S). - Pericardium, extracardiac: A small pericardial effusion was identified circumferential to the heart. Somewhat larger collection anterior to the apical right ventricle. No obvious hemodynamic compromise. With recent temporary transvenous pacemaker in place, consider a follow-up study to reassess.  Impressions:  - Severe LVH with LVEF 65-70% and grade 1 diastolic dysfunction. Mild mitral regurgitation. Sclerotic aortic valve with mild aortic regurgitation. Trivial tricuspid regurgitation with  PASP 22 mmHg. A small pericardial effusion was identified circumferential to the heart. Somewhat larger collection anterior to the apical right ventricle. No obvious hemodynamic compromise. With recent temporary transvenous pacemaker in place, consider a follow-up study to reassess.  Cardiac catheterization and PCI 11/16/2016: 1. Critical stenosis of the distal circumflex treated successfully with PCI using a 2.5 mm drug-eluting stent 2. Moderate mid LAD stenosis, does not appear to be flow-obstructive 3. Moderate proximal RCA stenosis (nondominant vessel) 4. Patent stents in the RCA and ramus intermedius 5. Normal LVEDP-will assess LV function by echo because of chronic kidney disease 6. Successful insertion of a temporary transvenous pacing wire for treatment of complete heart block  Assessment and Plan:  1.  CAD status post DES to the distal circumflex in March 2018.  Previous stent sites within the ramus and RCA were patent at that time.  She reports no angina symptoms on current medical therapy.  We have continued long-term dual antiplatelet therapy.  ECG reviewed and stable.  Continue observation for now.  2.  Mixed hyperlipidemia, continues on Crestor.  Requesting most recent lab work from [facility].  3.  CKD stage III, follows with nephrologist and [place].  4.  Essential hypertension, no changes made to present antihypertensive regimen.  Current medicines were reviewed with the patient today.   Orders Placed This Encounter  Procedures  . EKG 12-Lead    Disposition: Follow-up in 6 months.  Signed, Jonelle Sidle, MD, [facility] 04/02/2018 2:19 PM    [facility] at [facility] [address] [place], [place] [address] Phone: [phone]; Fax: [phone]

## 2018-04-02 ENCOUNTER — Encounter: Payer: Self-pay | Admitting: *Deleted

## 2018-04-02 ENCOUNTER — Ambulatory Visit (INDEPENDENT_AMBULATORY_CARE_PROVIDER_SITE_OTHER): Payer: Medicare Other | Admitting: Cardiology

## 2018-04-02 ENCOUNTER — Encounter: Payer: Self-pay | Admitting: Cardiology

## 2018-04-02 VITALS — BP 140/70 | HR 76 | Ht 65.0 in | Wt 187.2 lb

## 2018-04-02 DIAGNOSIS — N183 Chronic kidney disease, stage 3 unspecified: Secondary | ICD-10-CM

## 2018-04-02 DIAGNOSIS — I10 Essential (primary) hypertension: Secondary | ICD-10-CM | POA: Diagnosis not present

## 2018-04-02 DIAGNOSIS — I25119 Atherosclerotic heart disease of native coronary artery with unspecified angina pectoris: Secondary | ICD-10-CM

## 2018-04-02 DIAGNOSIS — E782 Mixed hyperlipidemia: Secondary | ICD-10-CM

## 2018-04-02 MED ORDER — HYDRALAZINE HCL 50 MG PO TABS: 50.0000 mg | ORAL_TABLET | Freq: Two times a day (BID) | ORAL | 3 refills | Status: DC

## 2018-04-02 NOTE — Patient Instructions (Signed)

## 2018-04-03 ENCOUNTER — Telehealth: Payer: Self-pay | Admitting: *Deleted

## 2018-04-03 DIAGNOSIS — Z79899 Other long term (current) drug therapy: Secondary | ICD-10-CM

## 2018-04-03 DIAGNOSIS — E782 Mixed hyperlipidemia: Secondary | ICD-10-CM

## 2018-04-03 NOTE — Telephone Encounter (Signed)
Patient informed and will come to the office to pick up lab orders prior to lab draw.

## 2018-04-09 ENCOUNTER — Other Ambulatory Visit (HOSPITAL_COMMUNITY)
Admission: RE | Admit: 2018-04-09 | Discharge: 2018-04-09 | Disposition: A | Discharge status: Home / Self Care | Payer: Medicare Other | Source: Ambulatory Visit | Attending: Cardiology | Admitting: Cardiology

## 2018-04-09 DIAGNOSIS — E782 Mixed hyperlipidemia: Secondary | ICD-10-CM | POA: Diagnosis not present

## 2018-04-09 DIAGNOSIS — Z79899 Other long term (current) drug therapy: Secondary | ICD-10-CM | POA: Diagnosis not present

## 2018-04-09 LAB — HEPATIC FUNCTION PANEL
ALT: 10 U/L (ref 0–44)
AST: 17 U/L (ref 15–41)
Albumin: 3.5 g/dL (ref 3.5–5.0)
Alkaline Phosphatase: 69 U/L (ref 38–126)
Bilirubin, Direct: <0.1 mg/dL (ref 0.0–0.2)
Total Bilirubin: 0.5 mg/dL (ref 0.3–1.2)
Total Protein: 7.2 g/dL (ref 6.5–8.1)

## 2018-04-09 LAB — LIPID PANEL
Cholesterol: 111 mg/dL (ref 0–200)
HDL: 45 mg/dL (ref >40)
LDL Cholesterol: 50 mg/dL (ref 0–99)
Total CHOL/HDL Ratio: 2.5 RATIO
Triglycerides: 81 mg/dL (ref <150)
VLDL: 16 mg/dL (ref 0–40)

## 2018-05-03 ENCOUNTER — Other Ambulatory Visit: Payer: Self-pay | Admitting: Cardiology

## 2018-05-07 DIAGNOSIS — M25561 Pain in right knee: Secondary | ICD-10-CM | POA: Diagnosis not present

## 2018-05-07 DIAGNOSIS — M170 Bilateral primary osteoarthritis of knee: Secondary | ICD-10-CM | POA: Diagnosis not present

## 2018-05-07 DIAGNOSIS — M25562 Pain in left knee: Secondary | ICD-10-CM | POA: Diagnosis not present

## 2018-05-07 DIAGNOSIS — M1711 Unilateral primary osteoarthritis, right knee: Secondary | ICD-10-CM | POA: Diagnosis not present

## 2018-05-07 DIAGNOSIS — M25461 Effusion, right knee: Secondary | ICD-10-CM | POA: Diagnosis not present

## 2018-05-10 ENCOUNTER — Other Ambulatory Visit: Payer: Self-pay | Admitting: Cardiology

## 2018-05-14 DIAGNOSIS — M1711 Unilateral primary osteoarthritis, right knee: Secondary | ICD-10-CM | POA: Diagnosis not present

## 2018-05-14 DIAGNOSIS — M25561 Pain in right knee: Secondary | ICD-10-CM | POA: Diagnosis not present

## 2018-05-28 DIAGNOSIS — M25561 Pain in right knee: Secondary | ICD-10-CM | POA: Diagnosis not present

## 2018-05-28 DIAGNOSIS — M1711 Unilateral primary osteoarthritis, right knee: Secondary | ICD-10-CM | POA: Diagnosis not present

## 2018-05-28 DIAGNOSIS — N342 Other urethritis: Secondary | ICD-10-CM | POA: Diagnosis not present

## 2018-05-28 DIAGNOSIS — E6609 Other obesity due to excess calories: Secondary | ICD-10-CM | POA: Diagnosis not present

## 2018-05-28 DIAGNOSIS — Z6831 Body mass index (BMI) 31.0-31.9, adult: Secondary | ICD-10-CM | POA: Diagnosis not present

## 2018-06-03 DIAGNOSIS — N342 Other urethritis: Secondary | ICD-10-CM | POA: Diagnosis not present

## 2018-06-04 DIAGNOSIS — M1711 Unilateral primary osteoarthritis, right knee: Secondary | ICD-10-CM | POA: Diagnosis not present

## 2018-06-04 DIAGNOSIS — M25561 Pain in right knee: Secondary | ICD-10-CM | POA: Diagnosis not present

## 2018-06-22 ENCOUNTER — Other Ambulatory Visit: Payer: Self-pay | Admitting: Cardiology

## 2018-07-02 DIAGNOSIS — M1712 Unilateral primary osteoarthritis, left knee: Secondary | ICD-10-CM | POA: Diagnosis not present

## 2018-07-02 DIAGNOSIS — M25562 Pain in left knee: Secondary | ICD-10-CM | POA: Diagnosis not present

## 2018-07-09 DIAGNOSIS — M25562 Pain in left knee: Secondary | ICD-10-CM | POA: Diagnosis not present

## 2018-07-09 DIAGNOSIS — M1712 Unilateral primary osteoarthritis, left knee: Secondary | ICD-10-CM | POA: Diagnosis not present

## 2018-07-16 DIAGNOSIS — M1712 Unilateral primary osteoarthritis, left knee: Secondary | ICD-10-CM | POA: Diagnosis not present

## 2018-07-16 DIAGNOSIS — M25562 Pain in left knee: Secondary | ICD-10-CM | POA: Diagnosis not present

## 2018-07-30 DIAGNOSIS — M25562 Pain in left knee: Secondary | ICD-10-CM | POA: Diagnosis not present

## 2018-07-30 DIAGNOSIS — M1712 Unilateral primary osteoarthritis, left knee: Secondary | ICD-10-CM | POA: Diagnosis not present

## 2018-08-13 DIAGNOSIS — R809 Proteinuria, unspecified: Secondary | ICD-10-CM | POA: Diagnosis not present

## 2018-08-13 DIAGNOSIS — N2581 Secondary hyperparathyroidism of renal origin: Secondary | ICD-10-CM | POA: Diagnosis not present

## 2018-08-13 DIAGNOSIS — N183 Chronic kidney disease, stage 3 (moderate): Secondary | ICD-10-CM | POA: Diagnosis not present

## 2018-08-13 DIAGNOSIS — I129 Hypertensive chronic kidney disease with stage 1 through stage 4 chronic kidney disease, or unspecified chronic kidney disease: Secondary | ICD-10-CM | POA: Diagnosis not present

## 2018-09-08 DIAGNOSIS — Z6830 Body mass index (BMI) 30.0-30.9, adult: Secondary | ICD-10-CM | POA: Diagnosis not present

## 2018-09-08 DIAGNOSIS — E6609 Other obesity due to excess calories: Secondary | ICD-10-CM | POA: Diagnosis not present

## 2018-09-08 DIAGNOSIS — J22 Unspecified acute lower respiratory infection: Secondary | ICD-10-CM | POA: Diagnosis not present

## 2018-09-08 DIAGNOSIS — J301 Allergic rhinitis due to pollen: Secondary | ICD-10-CM | POA: Diagnosis not present

## 2018-09-26 ENCOUNTER — Emergency Department (HOSPITAL_COMMUNITY)
Admission: EM | Admit: 2018-09-26 | Discharge: 2018-09-26 | Disposition: A | Discharge status: Home / Self Care | Payer: Medicare Other | Attending: Emergency Medicine | Admitting: Emergency Medicine

## 2018-09-26 ENCOUNTER — Emergency Department (HOSPITAL_COMMUNITY): Payer: Medicare Other

## 2018-09-26 DIAGNOSIS — K5732 Diverticulitis of large intestine without perforation or abscess without bleeding: Secondary | ICD-10-CM | POA: Insufficient documentation

## 2018-09-26 DIAGNOSIS — N183 Chronic kidney disease, stage 3 (moderate): Secondary | ICD-10-CM | POA: Diagnosis not present

## 2018-09-26 DIAGNOSIS — R195 Other fecal abnormalities: Secondary | ICD-10-CM

## 2018-09-26 DIAGNOSIS — I129 Hypertensive chronic kidney disease with stage 1 through stage 4 chronic kidney disease, or unspecified chronic kidney disease: Secondary | ICD-10-CM | POA: Diagnosis not present

## 2018-09-26 DIAGNOSIS — R1013 Epigastric pain: Secondary | ICD-10-CM | POA: Diagnosis present

## 2018-09-26 DIAGNOSIS — N281 Cyst of kidney, acquired: Secondary | ICD-10-CM

## 2018-09-26 DIAGNOSIS — Z7982 Long term (current) use of aspirin: Secondary | ICD-10-CM | POA: Insufficient documentation

## 2018-09-26 DIAGNOSIS — Z79899 Other long term (current) drug therapy: Secondary | ICD-10-CM | POA: Insufficient documentation

## 2018-09-26 DIAGNOSIS — R079 Chest pain, unspecified: Secondary | ICD-10-CM | POA: Diagnosis not present

## 2018-09-26 DIAGNOSIS — K5792 Diverticulitis of intestine, part unspecified, without perforation or abscess without bleeding: Secondary | ICD-10-CM | POA: Diagnosis not present

## 2018-09-26 LAB — INFLUENZA PANEL BY PCR (TYPE A & B)
Influenza A By PCR: NEGATIVE
Influenza B By PCR: NEGATIVE

## 2018-09-26 LAB — BASIC METABOLIC PANEL
Anion gap: 9 (ref 5–15)
BUN: 17 mg/dL (ref 8–23)
CO2: 22 mmol/L (ref 22–32)
Calcium: 9 mg/dL (ref 8.9–10.3)
Chloride: 108 mmol/L (ref 98–111)
Creatinine, Ser: 1.45 mg/dL — ABNORMAL HIGH (ref 0.44–1.00)
GFR calc Af Amer: 39 mL/min — ABNORMAL LOW (ref >60)
GFR calc non Af Amer: 33 mL/min — ABNORMAL LOW (ref >60)
Glucose, Bld: 112 mg/dL — ABNORMAL HIGH (ref 70–99)
Potassium: 3.9 mmol/L (ref 3.5–5.1)
Sodium: 139 mmol/L (ref 135–145)

## 2018-09-26 LAB — CBC
HCT: 32.2 % — ABNORMAL LOW (ref 36.0–46.0)
Hemoglobin: 9.8 g/dL — ABNORMAL LOW (ref 12.0–15.0)
MCH: 22 pg — ABNORMAL LOW (ref 26.0–34.0)
MCHC: 30.4 g/dL (ref 30.0–36.0)
MCV: 72.4 fL — ABNORMAL LOW (ref 80.0–100.0)
Platelets: 284 10*3/uL (ref 150–400)
RBC: 4.45 MIL/uL (ref 3.87–5.11)
RDW: 20.8 % — ABNORMAL HIGH (ref 11.5–15.5)
WBC: 10.4 10*3/uL (ref 4.0–10.5)
nRBC: 0 % (ref 0.0–0.2)

## 2018-09-26 LAB — HEPATIC FUNCTION PANEL
ALT: 11 U/L (ref 0–44)
AST: 18 U/L (ref 15–41)
Albumin: 3.4 g/dL — ABNORMAL LOW (ref 3.5–5.0)
Alkaline Phosphatase: 58 U/L (ref 38–126)
Bilirubin, Direct: <0.1 mg/dL (ref 0.0–0.2)
Total Bilirubin: 0.4 mg/dL (ref 0.3–1.2)
Total Protein: 7.2 g/dL (ref 6.5–8.1)

## 2018-09-26 LAB — I-STAT TROPONIN, ED
Troponin i, poc: 0.02 ng/mL (ref 0.00–0.08)
Troponin i, poc: 0.01 ng/mL (ref 0.00–0.08)

## 2018-09-26 LAB — URINALYSIS, ROUTINE W REFLEX MICROSCOPIC
Bilirubin Urine: NEGATIVE
Glucose, UA: NEGATIVE mg/dL
Hgb urine dipstick: NEGATIVE
Ketones, ur: 5 mg/dL — AB
Leukocytes, UA: NEGATIVE
Nitrite: NEGATIVE
Protein, ur: 30 mg/dL — AB
Specific Gravity, Urine: 1.02 (ref 1.005–1.030)
pH: 5 (ref 5.0–8.0)

## 2018-09-26 LAB — LIPASE, BLOOD: Lipase: 24 U/L (ref 11–51)

## 2018-09-26 LAB — POC OCCULT BLOOD, ED: Fecal Occult Bld: POSITIVE — AB

## 2018-09-26 MED ORDER — SODIUM CHLORIDE 0.9 % IV SOLN: Freq: Once | INTRAVENOUS | Status: DC

## 2018-09-26 MED ORDER — METRONIDAZOLE IN NACL 5-0.79 MG/ML-% IV SOLN
500.0000 mg | Freq: Once | INTRAVENOUS | Status: AC
  Administered 2018-09-26: 500 mg via INTRAVENOUS
  Filled 2018-09-26: qty 100

## 2018-09-26 MED ORDER — SODIUM CHLORIDE 0.9% FLUSH
3.0000 mL | Freq: Once | INTRAVENOUS | Status: AC
  Administered 2018-09-26: 3 mL via INTRAVENOUS

## 2018-09-26 MED ORDER — AMOXICILLIN-POT CLAVULANATE 875-125 MG PO TABS: 1.0000 | ORAL_TABLET | Freq: Two times a day (BID) | ORAL | 0 refills | Status: AC

## 2018-09-26 MED ORDER — MORPHINE SULFATE (PF) 2 MG/ML IV SOLN
2.0000 mg | Freq: Once | INTRAVENOUS | Status: AC
  Administered 2018-09-26: 2 mg via INTRAVENOUS
  Filled 2018-09-26: qty 1

## 2018-09-26 MED ORDER — HYDROCODONE-ACETAMINOPHEN 5-325 MG PO TABS
0.5000 | ORAL_TABLET | Freq: Once | ORAL | Status: AC
  Administered 2018-09-26: 0.5 via ORAL

## 2018-09-26 MED ORDER — HYDROCODONE-ACETAMINOPHEN 5-325 MG PO TABS
1.0000 | ORAL_TABLET | Freq: Once | ORAL | Status: DC
  Filled 2018-09-26: qty 1

## 2018-09-26 MED ORDER — HYDROCODONE-ACETAMINOPHEN 5-325 MG PO TABS: ORAL_TABLET | ORAL | 0 refills | Status: DC

## 2018-09-26 MED ORDER — SODIUM CHLORIDE 0.9 % IV BOLUS
1000.0000 mL | Freq: Once | INTRAVENOUS | Status: AC
  Administered 2018-09-26: 1000 mL via INTRAVENOUS

## 2018-09-26 MED ORDER — METOCLOPRAMIDE HCL 5 MG PO TABS: 5.0000 mg | ORAL_TABLET | Freq: Three times a day (TID) | ORAL | 0 refills | Status: DC | PRN

## 2018-09-26 MED ORDER — ACETAMINOPHEN 325 MG PO TABS
650.0000 mg | ORAL_TABLET | Freq: Once | ORAL | Status: AC
  Administered 2018-09-26: 650 mg via ORAL
  Filled 2018-09-26: qty 2

## 2018-09-26 MED ORDER — METOCLOPRAMIDE HCL 5 MG/ML IJ SOLN
5.0000 mg | Freq: Once | INTRAMUSCULAR | Status: AC
  Administered 2018-09-26: 5 mg via INTRAVENOUS
  Filled 2018-09-26: qty 2

## 2018-09-26 MED ORDER — SODIUM CHLORIDE 0.9 % IV SOLN
2.0000 g | Freq: Once | INTRAVENOUS | Status: AC
  Administered 2018-09-26: 2 g via INTRAVENOUS
  Filled 2018-09-26: qty 20

## 2018-09-26 NOTE — ED Provider Notes (Signed)
[facility] EMERGENCY DEPARTMENT Provider Note   CSN: [id number] Arrival date & time: 09/26/18  1532     History   Chief Complaint No chief complaint on file.   HPI [name] is a 85 y.o. female.  HPI  Patient is an 85 year old female with a history of CKD stage III, NSTEMI, CAD, hypertension, hyperlipidemia presenting for epigastric pain, nausea, vomiting, diarrhea, chest pain and persistent cough.  She presents with her family who assist in history taking.  According to family and patient, she has been feeling unwell for the past 4 days.  This is primarily been low appetite, epigastric pain, and a couple times of nausea and vomiting per day.  She reports that today she began having loose stools without melena or hematochezia.  She denies bilious or bloody vomiting.  Patient also reports that the epigastric pain seems to radiate up into her chest.  She reports that she seems to have a concomitant pain in the mid thoracic region.  Patient reports she continues to cough after 2 weeks of an upper respiratory infection for which she was treated by her primary care provider with 5 days of antibiotics.  She reports continued shortness of breath that is worsening over the past couple days.  She denies fevers or chills.  Abdominal surgical history significant for cholecystectomy and abdominal wall cyst removal.  No remedies prior to arrival for symptoms.  Past Medical History:  Diagnosis Date  . Arthritis   . CKD (chronic kidney disease), stage III (HCC)   . Complete heart block (HCC) 11/16/2016   a. transient during NSTEMI, resolved with revascularization.  . Coronary artery disease 11/2009   a. with prior stent placement to the ramus intermedius and RCA. b. NSTEMI 11/2016 s/p DES to DES to distal Cx with moderate residual dz.  . Essential hypertension   . Mixed hyperlipidemia   . Non-ST elevation (NSTEMI) myocardial infarction (HCC) 11/16/2016  . Obesity   . Reflux  esophagitis   . Sleep apnea 09/27/2010   Untreated, REM 64.7/hr AHI 18.5/hr RDI 19.2/hr. Patuent refused CPAP therapy    Patient Active Problem List   Diagnosis Date Noted  . CKD (chronic kidney disease), stage III (HCC) 11/23/2016  . Pericardial effusion 11/23/2016  . Complete heart block (HCC) 11/16/2016  . Non-ST elevation (NSTEMI) myocardial infarction (HCC) 11/16/2016  . Renal insufficiency 11/03/2015  . 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 Uncontrolled hypertension 08/31/2015  . HTN (hypertension), malignant   . Symptomatic bradycardia   . Hyperlipidemia   . Epigastric fullness   . Near syncope 08/28/2015  . CAD S/P PCI   . Essential hypertension 03/05/2013  . Obesity (BMI 30-39.9) 03/05/2013  . Hyperlipidemia with target LDL less than 70 03/05/2013  . Obstructive sleep apnea 03/05/2013    Past Surgical History:  Procedure Laterality Date  . CHOLECYSTECTOMY    . CORONARY ANGIOPLASTY WITH STENT PLACEMENT  2007   A 3.0x51mm CYPHER stent post dilated to 3.29 mm the 100% occlusion was reduced to 0%  . CORONARY STENT INTERVENTION N/A 11/16/2016   Procedure: Coronary Stent Intervention;  Surgeon: Tonny Bollman, MD;  Location: [facility] INVASIVE CV LAB;  Service: Cardiovascular;  Laterality: N/A;  . LEFT HEART CATH AND CORONARY ANGIOGRAPHY N/A 11/16/2016   Procedure: Left Heart Cath and Coronary Angiography;  Surgeon: Tonny Bollman, MD;  Location: [facility] INVASIVE CV LAB;  Service: Cardiovascular;  Laterality: N/A;  . LEFT HEART CATHETERIZATION WITH CORONARY  ANGIOGRAM N/A 07/12/2014   Procedure: LEFT HEART CATHETERIZATION WITH CORONARY ANGIOGRAM;  Surgeon: Micheline ChapmanMichael D Cooper, MD;  [facility];  Service: Cardiovascular;  Laterality: N/A;  . TEMPORARY PACEMAKER N/A 11/16/2016   Procedure: Temporary Pacemaker;  Surgeon: Tonny BollmanMichael Cooper, MD;  [facility];  Service: Cardiovascular;  Laterality: N/A;     OB History   No obstetric  history on file.      Home Medications    Prior to Admission medications   Medication Sig Start Date End Date Taking? Authorizing Provider  amLODipine (NORVASC) 5 MG tablet TAKE 1 TABLET BY MOUTH EVERY DAY Patient taking differently: Take 5 mg by mouth daily.  05/05/18  Yes Jonelle SidleMcDowell, Samuel G, MD  aspirin 81 MG tablet Take 81 mg by mouth daily.   Yes [provider]  BRILINTA 90 MG TABS tablet TAKE 1 TABLET BY MOUTH TWICE A DAY Patient taking differently: Take 90 mg by mouth 2 (two) times daily.  05/13/18  Yes Branch, Dorothe PeaJonathan F, MD  furosemide (LASIX) 40 MG tablet TAKE 1 TABLET BY MOUTH DAILY AS NEEDED FOR EDEMA Patient taking differently: Take 40 mg by mouth daily as needed for edema.  10/10/15  Yes Lennette BihariKelly, Thomas A, MD  hydrALAZINE (APRESOLINE) 50 MG tablet Take 1 tablet (50 mg total) by mouth 2 (two) times daily. Patient taking differently: Take 25 mg by mouth at bedtime.  04/02/18  Yes Jonelle SidleMcDowell, Samuel G, MD  losartan (COZAAR) 50 MG tablet TAKE 1 TABLET BY MOUTH EVERY DAY Patient taking differently: Take 50 mg by mouth daily.  12/18/17  Yes Lennette BihariKelly, Thomas A, MD  nitroGLYCERIN (NITROSTAT) 0.4 MG SL tablet Place 1 tablet (0.4 mg total) under the tongue every 5 (five) minutes as needed. 11/26/16  Yes Dunn, Dayna N, PA-C  pantoprazole (PROTONIX) 40 MG tablet TAKE 1 TABLET BY MOUTH EVERY DAY Patient taking differently: Take 40 mg by mouth daily.  06/23/18  Yes Jonelle SidleMcDowell, Samuel G, MD  rosuvastatin (CRESTOR) 40 MG tablet Take 1 tablet (40 mg total) by mouth daily. 08/29/17  Yes Jonelle SidleMcDowell, Samuel G, MD  Vitamin D, Ergocalciferol, (DRISDOL) 50000 units CAPS capsule Take 50,000 Units by mouth every 30 (thirty) days. 11/07/16  Yes [provider]  amoxicillin-clavulanate (AUGMENTIN) 875-125 MG tablet Take 1 tablet by mouth every 12 (twelve) hours for 10 days. 09/26/18 10/06/18  Aviva KluverMurray, Stevin Bielinski B, PA-C  HYDROcodone-acetaminophen (NORCO/VICODIN) 5-325 MG tablet Take 0.5-1 tablets every 6 hours  as needed for pain. 09/26/18   Aviva KluverMurray, Nyheem Binette B, PA-C  metoCLOPramide (REGLAN) 5 MG tablet Take 1 tablet (5 mg total) by mouth 3 (three) times daily as needed for nausea. 09/26/18   Aviva KluverMurray, Rainn Bullinger B, PA-C  omeprazole (PRILOSEC) 20 MG capsule Take 1 capsule (20 mg total) by mouth daily. Take one tablet daily Patient not taking: Reported on 09/26/2018 04/13/17   Gerhard MunchLockwood, Robert, MD    Family History Family History  Problem Relation Age of Onset  . Hypertension Other     Social History Social History   Tobacco Use  . Smoking status: Never Smoker  . Smokeless tobacco: Never Used  Substance Use Topics  . Alcohol use: No  . Drug use: No     Allergies   Atorvastatin; Contrast media [iodinated diagnostic agents]; Darvon [propoxyphene]; and Codeine   Review of Systems Review of Systems  Constitutional: Negative for chills and fever.  HENT: Negative for congestion, rhinorrhea, sinus pain and sore throat.   Eyes: Negative for visual disturbance.  Respiratory: Positive for cough and shortness of breath. Negative for chest tightness.   Cardiovascular: Positive for chest pain. Negative for palpitations and leg swelling.  Gastrointestinal: Positive for abdominal pain, diarrhea, nausea and vomiting. Negative for constipation.  Genitourinary: Negative for dysuria and flank pain.  Musculoskeletal: Negative for back pain and myalgias.  Skin: Negative for rash.  Neurological: Negative for dizziness, syncope, light-headedness and headaches.     Physical Exam Updated Vital Signs BP (!) 116/59   Pulse 65   Temp 100.3 F (37.9 C) (Rectal)   Resp 16   SpO2 99%   Physical Exam Vitals signs and nursing note reviewed.  Constitutional:      General: She is not in acute distress.    Appearance: She is well-developed.  HENT:     Head: Normocephalic and atraumatic.     Mouth/Throat:     Mouth: Mucous membranes are dry.  Eyes:     Conjunctiva/sclera: Conjunctivae normal.     Pupils: Pupils  are equal, round, and reactive to light.  Neck:     Musculoskeletal: Normal range of motion and neck supple.  Cardiovascular:     Rate and Rhythm: Normal rate and regular rhythm.     Heart sounds: S1 normal and S2 normal. No murmur.  Pulmonary:     Effort: Pulmonary effort is normal.     Breath sounds: Normal breath sounds. No wheezing or rales.  Abdominal:     General: Bowel sounds are normal. There is no distension.     Palpations: Abdomen is soft.     Tenderness: There is abdominal tenderness. There is no guarding.     Comments: Epigastric, left upper and right upper quadrant tenderness without guarding or rebound.  Patient also has some mild left lower quadrant tenderness without guarding or rebound.  Genitourinary:    Comments: Rectal examination performed with RN chaperone present.  Normal rectal tone.  Soft, brown stool present in rectal vault.  No melena or hematochezia. Musculoskeletal: Normal range of motion.        General: No deformity.  Lymphadenopathy:     Cervical: No cervical adenopathy.  Skin:    General: Skin is warm and dry.     Findings: No erythema or rash.  Neurological:     Mental Status: She is alert.     Comments: Cranial nerves grossly intact. Patient moves extremities symmetrically and with good coordination.  Psychiatric:        Behavior: Behavior normal.        Thought Content: Thought content normal.        Judgment: Judgment normal.      ED Treatments / Results  Labs (all labs ordered are listed, but only abnormal results are displayed) Labs Reviewed  BASIC METABOLIC PANEL - Abnormal; Notable for the following components:      Result Value   Glucose, Bld 112 (*)    Creatinine, Ser 1.45 (*)    GFR calc non Af Amer 33 (*)    GFR calc Af Amer 39 (*)    All other components within normal limits  CBC - Abnormal; Notable for the following components:   Hemoglobin 9.8 (*)    HCT 32.2 (*)    MCV 72.4 (*)    MCH 22.0 (*)    RDW 20.8 (*)    All  other components within normal limits  HEPATIC FUNCTION PANEL - Abnormal; Notable for the following components:   Albumin 3.4 (*)    All other components  within normal limits  URINALYSIS, ROUTINE W REFLEX MICROSCOPIC - Abnormal; Notable for the following components:   APPearance HAZY (*)    Ketones, ur 5 (*)    Protein, ur 30 (*)    Bacteria, UA RARE (*)    All other components within normal limits  POC OCCULT BLOOD, ED - Abnormal; Notable for the following components:   Fecal Occult Bld POSITIVE (*)    All other components within normal limits  URINE CULTURE  CULTURE, BLOOD (ROUTINE X 2)  CULTURE, BLOOD (ROUTINE X 2)  LIPASE, BLOOD  INFLUENZA PANEL BY PCR (TYPE A & B)  I-STAT TROPONIN, ED  I-STAT CG4 LACTIC ACID, ED  I-STAT CG4 LACTIC ACID, ED  I-STAT TROPONIN, ED    EKG EKG Interpretation  Date/Time:  Friday September 26 2018 16:27:25 EST Ventricular Rate:  74 PR Interval:  162 QRS Duration: 116 QT Interval:  450 QTC Calculation: 499 R Axis:   -82 Text Interpretation:  Normal sinus rhythm Right bundle branch block Left anterior fascicular block Bifascicular block  Abnormal ECG since last tracing no significant change Confirmed by Rolan Bucco [phone]) on 09/26/2018 5:31:36 PM   Radiology Ct Abdomen Pelvis Wo Contrast  Result Date: 09/26/2018 CLINICAL DATA:  85 year old female with abdominal pain. Concern for acute diverticulitis. EXAM: CT ABDOMEN AND PELVIS WITHOUT CONTRAST TECHNIQUE: Multidetector CT imaging of the abdomen and pelvis was performed following the standard protocol without IV contrast. COMPARISON:  Abdominal CT dated 04/13/2017 FINDINGS: Evaluation of this exam is limited in the absence of intravenous contrast. Lower chest: The visualized lung bases are clear. Top-normal cardiac size. Coronary vascular calcification. No intra-abdominal free air or free fluid. Hepatobiliary: The liver is unremarkable. No intrahepatic biliary ductal dilatation. Cholecystectomy.  Pancreas: Unremarkable. No pancreatic ductal dilatation or surrounding inflammatory changes. Spleen: Normal in size without focal abnormality. Adrenals/Urinary Tract: The adrenal glands are unremarkable. There is no hydronephrosis or nephrolithiasis on either side. There is a 2.5 cm low attenuating lesion in the upper pole of the right kidney which demonstrates fluid attenuation. This is not characterized on this unenhanced CT but appears similar to prior CT and most likely represents a cyst. This can be further evaluated with ultrasound. The visualized ureters and urinary bladder appear unremarkable. Stomach/Bowel: There is extensive sigmoid diverticulosis. There is focal area of inflammatory changes abutting a diverticula in the pelvis (series 3 image 67) consistent with acute diverticulitis. No diverticular perforation or abscess. Extensive diffuse colonic diverticulosis. There is a moderate size hiatal hernia. No bowel obstruction. The appendix is normal. Vascular/Lymphatic: Mild aortoiliac atherosclerotic disease. No portal venous gas. There is no adenopathy. Reproductive: The uterus and ovaries are grossly unremarkable. No pelvic mass. Other: None Musculoskeletal: Degenerative changes of the spine. Grade 1 L4-L5 anterolisthesis. Disc desiccation and vacuum phenomena at L5-S1. Multilevel facet arthropathy. No acute osseous pathology. IMPRESSION: 1. Sigmoid diverticulitis. No diverticular perforation or abscess. 2. No bowel obstruction. Normal appendix. 3. Moderate size hiatal hernia. 4. Probable right renal cyst. This can be further evaluated with ultrasound. Electronically Signed   By: Elgie Collard M.D.   On: 09/26/2018 19:23   Dg Chest 2 View  Result Date: 09/26/2018 CLINICAL DATA:  Chest pain. EXAM: CHEST - 2 VIEW COMPARISON:  November 16, 2016 FINDINGS: Stable cardiomegaly. Small hiatal hernia. The hila, mediastinum, lungs, and pleura are unremarkable. IMPRESSION: No active cardiopulmonary disease.  Electronically Signed   By: Gerome Sam III M.D   On: 09/26/2018 18:01    Procedures Procedures (including critical  care time)  Medications Ordered in ED Medications  cefTRIAXone (ROCEPHIN) 2 g in sodium chloride 0.9 % 100 mL IVPB (0 g Intravenous Stopped 09/26/18 2148)    And  metroNIDAZOLE (FLAGYL) IVPB 500 mg (500 mg Intravenous New Bag/Given 09/26/18 2156)  0.9 %  sodium chloride infusion (has no administration in time range)  sodium chloride flush (NS) 0.9 % injection 3 mL (3 mLs Intravenous Given 09/26/18 2054)  sodium chloride 0.9 % bolus 1,000 mL (0 mLs Intravenous Stopped 09/26/18 1855)  acetaminophen (TYLENOL) tablet 650 mg (650 mg Oral Given 09/26/18 1824)  morphine 2 MG/ML injection 2 mg (2 mg Intravenous Given 09/26/18 1841)  metoCLOPramide (REGLAN) injection 5 mg (5 mg Intravenous Given 09/26/18 1931)  HYDROcodone-acetaminophen (NORCO/VICODIN) 5-325 MG per tablet 0.5 tablet (0.5 tablets Oral Given 09/26/18 2102)     Initial Impression / Assessment and Plan / ED Course  I have reviewed the triage vital signs and the nursing notes.  Pertinent labs & imaging results that were available during my care of the patient were reviewed by me and considered in my medical decision making (see chart for details).  Clinical Course as of Sep 26 2246  Fri Sep 26, 2018  1734 2 point drop in one year.   Hemoglobin(!): 9.8 [AM]  2017 Demonstrating diverticulitis.   CT Abdomen Pelvis Wo Contrast [AM]  2234 Reassessed. Tolerating PO and wants to go home.    [AM]    Clinical Course User Index [AM] Elisha Ponder, PA-C    Patient nontoxic-appearing, febrile to 100.4, no acute distress.  No tachycardia, tachypnea nor hypertension.  Patient not meeting SIRS criteria or septic at this time.  Differential diagnosis includes pneumonia, ACS, urinary tract infection, appendicitis, diverticulitis, small bowel obstruction.  Doubt pulmonary embolism, as patient is not short of breath or  tachycardic, patient symptoms appear predominantly intra-abdominal.  Laboratory analysis demonstrating two-point hemoglobin drop into 1 year.  Rectal exam reveals no melena or hematochezia. Hemoglobin  Date Value Ref Range Status  09/26/2018 9.8 (L) 12.0 - 15.0 g/dL Final  16/06/9603 54.0 (L) 12.0 - 15.0 g/dL Final  98/07/9146 82.9 (L) 12.0 - 15.0 g/dL Final  56/21/3086 57.8 12.0 - 15.0 g/dL Final   Patient's renal function is at baseline with creatinine clearance of 39.  Lipase is normal.  Urinalysis not concerning for infection.  Fecal occult blood is positive.  Chest x-ray without acute cardiopulmonary abnormality.  Patient has sigmoid diverticulitis without abscess.  Patient also has possible right renal cyst that will require ultrasound follow-up.  Patient notified of this result.  Patient received initial first dose of antibiotics.  She is feeling much better with resolution in her pain, tolerance of p.o., and desiring to go home.  Feel this is reasonable based on patient's clinical status.  Patient dependently evaluated by Dr. Rolan Bucco who agrees.  Patient was given extensive return precautions for any changes in clinical status with fevers, pain, intractable nausea or vomiting.  Home regimen of antiemetics and antibiotics was discussed with Fleet Contras, pharmacist in the ED.  Discussed that patient does not need renal dosing of her Augmentin.  Patient did have slightly prolonged QT on EKG, however Reglan is least QT prolonging, and recommended least amount and lowest dose.  This is a shared visit with Dr. Rolan Bucco. Patient was independently evaluated by this attending physician. Attending physician consulted in evaluation and discharge management.  Final Clinical Impressions(s) / ED Diagnoses   Final diagnoses:  Sigmoid diverticulitis  Renal cyst  Fecal occult blood test positive    ED Discharge Orders         Ordered    amoxicillin-clavulanate (AUGMENTIN) 875-125 MG tablet   Every 12 hours     09/26/18 2245    metoCLOPramide (REGLAN) 5 MG tablet  3 times daily PRN     09/26/18 2245    HYDROcodone-acetaminophen (NORCO/VICODIN) 5-325 MG tablet     09/26/18 2245           Delia Chimes 09/26/18 2258    Rolan Bucco, MD 09/26/18 2330

## 2018-09-26 NOTE — Discharge Instructions (Signed)
Please see the information and instructions below regarding your visit.  Your diagnoses today include:  1. Sigmoid diverticulitis   2. Renal cyst   3. Fecal occult blood test positive     Tests performed today include: See side panel of your discharge paperwork for testing performed today. Vital signs are listed at the bottom of these instructions.   Your scan showed diverticulitis.  You will need the additional evaluated by your primary care provider:   -Follow up on your drop in hemoglobin. You had a small amount of blood in your urine today.  -Ultrasound of your kidneys. They appear to have a small cyst.   Medications prescribed:    Take any prescribed medications only as prescribed, and any over the counter medications only as directed on the packaging.  Please take all of your antibiotics until finished.   You may develop abdominal discomfort or nausea from the antibiotic. If this occurs, you may take it with food. Some patients also get diarrhea with antibiotics. You may help offset this with probiotics which you can buy or get in yogurt. Do not eat or take the probiotics until 2 hours after your antibiotic. Some women develop vaginal yeast infections after antibiotics. If you develop unusual vaginal discharge after being on this medication, please see your primary care provider.   Some people develop allergies to antibiotics. Symptoms of antibiotic allergy can be mild and include a flat rash and itching. They can also be more serious and include:  ?Hives - Hives are raised, red patches of skin that are usually very itchy.  ?Lip or tongue swelling  ?Trouble swallowing or breathing  ?Blistering of the skin or mouth.  If you have any of these serious symptoms, please seek emergency medical care immediately.  You have been prescribed Norco for pain. This is an opioid pain medication. You may take one half a pill of this medication every 4-6 hours as needed for pain. Only take  this medication if you need it for breakthrough pain.   Do not combine this medication with Tylenol, as it may increase the risk of liver problems.  Do not combine this medication with alcohol.  Please be advised to avoid driving or operating heavy machinery while taking this medication, as it may make you drowsy or impair judgment.   Reglan.  This is a medicine for nausea.  Please only take 5 mg every 8 hours as needed for nausea and vomiting.  Home care instructions:  Please follow any educational materials contained in this packet.   Follow-up instructions: Please follow-up with your primary care provider in 5 days for further evaluation of your symptoms if they are not completely improved.   Return instructions:  Please return to the Emergency Department if you experience worsening symptoms.  Please return the emergency department immediately if you develop any worsening pain, fevers, not tolerating things by mouth, bright red blood in your stool. Please return if you have any other emergent concerns.  Additional Information:   Your vital signs today were: BP (!) 116/59    Pulse 65    Temp 100.3 F (37.9 C) (Rectal)    Resp 16    SpO2 99%  If your blood pressure (BP) was elevated on multiple readings during this visit above 130 for the top number or above 80 for the bottom number, please have this repeated by your primary care provider within one month. --------------  Thank you for allowing [place] to participate in your care  today.

## 2018-09-26 NOTE — ED Notes (Signed)
Patient verbalizes understanding of discharge instructions. Opportunity for questioning and answers were provided. Armband removed by staff, pt discharged from ED.  

## 2018-09-26 NOTE — ED Triage Notes (Signed)
Pt here from home with c/o n.v times 1 week , also some chest pain and  back pain , history of Nstemi

## 2018-09-27 LAB — URINE CULTURE: Culture: NO GROWTH

## 2018-09-27 NOTE — Care Management (Addendum)
09/27/2018 12:43  Spoke with patient's daughter over the phone.  She is not able to fill patient's prescriptions because they were not received by [facility] last night.  Pharmacist states perhaps because pharmacy was closed and system was updating.    Daughter and patient are in [place] and went to [facility] ED to try to obtain prescriptions.  RN called in Reglan and Augmentin and scripts were obtained by pt's daughter.  Pharmacy needs e-script or hard copy of prescription to fill narcotic.    Spoke with Dr. Judd Lien in [facility], but he did not have access to write a new prescription, print, or e-scribe due to patient being discharged greater than 2 hours ago.    Spoke with [facility] ED charge RN.  Dr. Hyacinth Meeker, ED MD, agreed to hand-write a prescription for Vicodin.  Advised daughter she may come to the ED to pick up script and take to pharmacy.  Apologies offered to daughter.

## 2018-09-27 NOTE — ED Notes (Signed)
Spoke with [facility] regarding why the pt's prescriptions were not there this morning as her daughter presented to the ED asking for help with this.  [facility] states their system was doing an update last night and the prescriptions were rejected.  Because the patient had been discharged >2 hours, the prescriptions were unable to be resent or printed.  Dr. Hyacinth Meeker was made aware and permission given to call rx to the pharmacy.  Because one was hydrocodone, they would not accept this as a call in.  Received call from case management at [facility] asking for my assistance with this.  Hard copy of prescription being obtained from Dr. Hyacinth Meeker to give to pt's daughter.

## 2018-09-28 ENCOUNTER — Telehealth: Payer: Self-pay | Admitting: Surgery

## 2018-09-28 NOTE — Telephone Encounter (Signed)
ED CM contacted patient to follow up on if the patient was able to obtain and fill  Prescription, [name] confirmed prescriptions were filled, No further ED CM needs identified.

## 2018-09-29 DIAGNOSIS — E6609 Other obesity due to excess calories: Secondary | ICD-10-CM | POA: Diagnosis not present

## 2018-09-29 DIAGNOSIS — Z6830 Body mass index (BMI) 30.0-30.9, adult: Secondary | ICD-10-CM | POA: Diagnosis not present

## 2018-09-29 DIAGNOSIS — R197 Diarrhea, unspecified: Secondary | ICD-10-CM | POA: Diagnosis not present

## 2018-09-29 DIAGNOSIS — Z1389 Encounter for screening for other disorder: Secondary | ICD-10-CM | POA: Diagnosis not present

## 2018-09-29 DIAGNOSIS — K5792 Diverticulitis of intestine, part unspecified, without perforation or abscess without bleeding: Secondary | ICD-10-CM | POA: Diagnosis not present

## 2018-09-30 ENCOUNTER — Encounter: Payer: Self-pay | Admitting: Cardiology

## 2018-09-30 ENCOUNTER — Ambulatory Visit (INDEPENDENT_AMBULATORY_CARE_PROVIDER_SITE_OTHER): Payer: Medicare Other | Admitting: Cardiology

## 2018-09-30 VITALS — BP 124/70 | HR 83 | Ht 65.0 in | Wt 183.0 lb

## 2018-09-30 DIAGNOSIS — I25119 Atherosclerotic heart disease of native coronary artery with unspecified angina pectoris: Secondary | ICD-10-CM

## 2018-09-30 DIAGNOSIS — R0602 Shortness of breath: Secondary | ICD-10-CM | POA: Diagnosis not present

## 2018-09-30 DIAGNOSIS — I10 Essential (primary) hypertension: Secondary | ICD-10-CM | POA: Diagnosis not present

## 2018-09-30 DIAGNOSIS — E782 Mixed hyperlipidemia: Secondary | ICD-10-CM | POA: Diagnosis not present

## 2018-09-30 DIAGNOSIS — N183 Chronic kidney disease, stage 3 unspecified: Secondary | ICD-10-CM

## 2018-09-30 NOTE — Progress Notes (Signed)
Cardiology Office Note  Date: 09/30/2018   ID: [name], DOB 11/26/1933, MRN [id number]  PCP: Assunta FoundGolding, John, MD  Primary Cardiologist: Nona DellSamuel Lukas Pelcher, MD   Chief Complaint  Patient presents with  . Coronary Artery Disease    History of Present Illness: [name] is an 85 y.o. female last seen in July 2019.  She is here today with her daughter for a follow-up visit.  She does not report any worsening angina symptoms, has chronic dyspnea on exertion and fatigue.  Also chronic pain due to arthritis.  Records indicate recent diagnosis of diverticulitis.  She is currently on antibiotics at this time.  No reported fevers or chills.  We went over her medications today.  She is concerned about possibly having side effects related to Brilinta.  She is now nearly 2 years out from previous cardiac event and we discussed simplifying to an aspirin daily for now.  Otherwise her regimen looks good in terms of blood pressure control.  Past Medical History:  Diagnosis Date  . Arthritis   . CKD (chronic kidney disease), stage III (HCC)   . Complete heart block (HCC) 11/16/2016   a. transient during NSTEMI, resolved with revascularization.  . Coronary artery disease 11/2009   a. with prior stent placement to the ramus intermedius and RCA. b. NSTEMI 11/2016 s/p DES to DES to distal Cx with moderate residual dz.  . Essential hypertension   . Mixed hyperlipidemia   . Non-ST elevation (NSTEMI) myocardial infarction (HCC) 11/16/2016  . Obesity   . Reflux esophagitis   . Sleep apnea 09/27/2010   Untreated, REM 64.7/hr AHI 18.5/hr RDI 19.2/hr. Patuent refused CPAP therapy    Past Surgical History:  Procedure Laterality Date  . CHOLECYSTECTOMY    . CORONARY ANGIOPLASTY WITH STENT PLACEMENT  2007   A [id number] CYPHER stent post dilated to 3.29 mm the 100% occlusion was reduced to 0%  . CORONARY STENT INTERVENTION N/A 11/16/2016   Procedure: Coronary Stent Intervention;  Surgeon: Tonny BollmanMichael  Cooper, MD;  [facility];  Service: Cardiovascular;  Laterality: N/A;  . LEFT HEART CATH AND CORONARY ANGIOGRAPHY N/A 11/16/2016   Procedure: Left Heart Cath and Coronary Angiography;  Surgeon: Tonny BollmanMichael Cooper, MD;  [facility];  Service: Cardiovascular;  Laterality: N/A;  . LEFT HEART CATHETERIZATION WITH CORONARY ANGIOGRAM N/A 07/12/2014   Procedure: LEFT HEART CATHETERIZATION WITH CORONARY ANGIOGRAM;  Surgeon: Micheline ChapmanMichael D Cooper, MD;  [facility];  Service: Cardiovascular;  Laterality: N/A;  . TEMPORARY PACEMAKER N/A 11/16/2016   Procedure: Temporary Pacemaker;  Surgeon: Tonny BollmanMichael Cooper, MD;  [facility];  Service: Cardiovascular;  Laterality: N/A;    Current Outpatient Medications  Medication Sig Dispense Refill  . amLODipine (NORVASC) 5 MG tablet TAKE 1 TABLET BY MOUTH EVERY DAY (Patient taking differently: Take 5 mg by mouth daily. ) 90 tablet 3  . amoxicillin-clavulanate (AUGMENTIN) 875-125 MG tablet Take 1 tablet by mouth every 12 (twelve) hours for 10 days. 19 tablet 0  . aspirin 81 MG tablet Take 81 mg by mouth daily.    . furosemide (LASIX) 40 MG tablet TAKE 1 TABLET BY MOUTH DAILY AS NEEDED FOR EDEMA (Patient taking differently: Take 40 mg by mouth daily as needed for edema. ) 30 tablet 0  . hydrALAZINE (APRESOLINE) 50 MG tablet Take 1 tablet (50 mg total) by mouth 2 (two) times daily. (Patient taking differently: Take 25 mg by mouth at bedtime. ) 180  tablet 3  . HYDROcodone-acetaminophen (NORCO/VICODIN) 5-325 MG tablet Take 0.5-1 tablets every 6 hours as needed for pain. 8 tablet 0  . losartan (COZAAR) 50 MG tablet TAKE 1 TABLET BY MOUTH EVERY DAY (Patient taking differently: Take 50 mg by mouth daily. ) 30 tablet 9  . metroNIDAZOLE (FLAGYL) 250 MG tablet Take 250 mg by mouth 3 (three) times daily.    . nitroGLYCERIN (NITROSTAT) 0.4 MG SL tablet Place 1 tablet (0.4 mg total) under the tongue every 5 (five) minutes as needed. 25  tablet 3  . omeprazole (PRILOSEC) 20 MG capsule Take 1 capsule (20 mg total) by mouth daily. Take one tablet daily 21 capsule 0  . pantoprazole (PROTONIX) 40 MG tablet TAKE 1 TABLET BY MOUTH EVERY DAY (Patient taking differently: Take 40 mg by mouth daily. ) 90 tablet 1  . rosuvastatin (CRESTOR) 40 MG tablet Take 1 tablet (40 mg total) by mouth daily. 90 tablet 3  . Vitamin D, Ergocalciferol, (DRISDOL) 50000 units CAPS capsule Take 50,000 Units by mouth every 30 (thirty) days.  0   No current facility-administered medications for this visit.    Allergies:  Atorvastatin; Contrast media [iodinated diagnostic agents]; Darvon [propoxyphene]; and Codeine   Social History: The patient  reports that she has never smoked. She has never used smokeless tobacco. She reports that she does not drink alcohol or use drugs.   ROS:  Please see the history of present illness. Otherwise, complete review of systems is positive for chronic arthritic pain.  All other systems are reviewed and negative.   Physical Exam: VS:  BP 124/70 (BP Location: Right Arm)   Pulse 83   Ht  (1.651 m)   Wt 183 lb (83 kg)   SpO2 98%   BMI 30.45 kg/m , BMI Body mass index is 30.45 kg/m.  Wt Readings from Last 3 Encounters:  09/30/18 183 lb (83 kg)  04/02/18 187 lb 3.2 oz (84.9 kg)  09/27/17 186 lb 3.2 oz (84.5 kg)    General: Elderly woman, appears comfortable at rest. HEENT: Conjunctiva and lids normal, oropharynx clear. Neck: Supple, no elevated JVP or carotid bruits, no thyromegaly. Lungs: Clear to auscultation, nonlabored breathing at rest. Cardiac: Regular rate and rhythm, no S3 or significant systolic murmur. Abdomen: Soft, nontender, bowel sounds present. Extremities: No pitting edema, distal pulses 2+. Skin: Warm and dry. Musculoskeletal: No kyphosis. Neuropsychiatric: Alert and oriented x3, affect grossly appropriate.  ECG: I personally reviewed the tracing from 09/26/2018 which showed sinus rhythm with  right bundle branch block and left anterior fascicular block.  Recent Labwork: 09/26/2018: ALT 11; AST 18; BUN 17; Creatinine, Ser 1.45; Hemoglobin 9.8; Platelets 284; Potassium 3.9; Sodium 139     Component Value Date/Time   CHOL 111 04/09/2018 0821   CHOL 146 01/19/2015 1048   TRIG 81 04/09/2018 0821   HDL 45 04/09/2018 0821   HDL 48 01/19/2015 1048   CHOLHDL 2.5 04/09/2018 0821   VLDL 16 04/09/2018 0821   LDLCALC 50 04/09/2018 0821   LDLCALC 77 01/19/2015 1048    Other Studies Reviewed Today:  Echocardiogram3/07/2017: Study Conclusions  - Left ventricle: The cavity size was normal. Wall thickness was increased in a pattern of severe LVH. Systolic function was vigorous. The estimated ejection fraction was in the range of 65% to 70%. Wall motion was normal; there were no regional wall motion abnormalities. Doppler parameters are consistent with abnormal left ventricular relaxation (grade 1 diastolic dysfunction). - Aortic valve: Mildly calcified annulus.  Trileaflet; mildly calcified leaflets. There was mild regurgitation. Mean gradient (S): 8 mm Hg. Peak gradient (S): 17 mm Hg. VTI ratio of LVOT to aortic valve: 0.71. - Mitral valve: Calcified annulus. There was mild regurgitation. - Right atrium: Central venous pressure (est): 3 mm Hg. - Atrial septum: No defect or patent foramen ovale was identified. - Tricuspid valve: There was trivial regurgitation. - Pulmonary arteries: PA peak pressure: 22 mm Hg (S). - Pericardium, extracardiac: A small pericardial effusion was identified circumferential to the heart. Somewhat larger collection anterior to the apical right ventricle. No obvious hemodynamic compromise. With recent temporary transvenous pacemaker in place, consider a follow-up study to reassess.  Impressions:  - Severe LVH with LVEF 65-70% and grade 1 diastolic dysfunction. Mild mitral regurgitation. Sclerotic aortic valve with  mild aortic regurgitation. Trivial tricuspid regurgitation with PASP 22 mmHg. A small pericardial effusion was identified circumferential to the heart. Somewhat larger collection anterior to the apical right ventricle. No obvious hemodynamic compromise. With recent temporary transvenous pacemaker in place, consider a follow-up study to reassess.  Cardiac catheterization and PCI 11/16/2016: 1. Critical stenosis of the distal circumflex treated successfully with PCI using a 2.5 mm drug-eluting stent 2. Moderate mid LAD stenosis, does not appear to be flow-obstructive 3. Moderate proximal RCA stenosis (nondominant vessel) 4. Patent stents in the RCA and ramus intermedius 5. Normal LVEDP-will assess LV function by echo because of chronic kidney disease 6. Successful insertion of a temporary transvenous pacing wire for treatment of complete heart block  Assessment and Plan:  1.  CAD status post DES to the distal circumflex in March 2018.  Stent sites within the ramus intermedius and RCA were patent at that time.  She reports chronic shortness of breath but no angina symptoms.  Plan to stop Brilinta and continue aspirin in addition to remaining cardiac regimen.  Also follow-up echocardiogram.  Reviewed her recent ECG.  Troponin I level negative with recent ER visit.  2.  Mixed hyperlipidemia, continues on Crestor with follow-up per PCP.  Last LDL was 50.  3.  CKD stage III, recent creatinine 1.45.  4.  Hypertension, blood pressure is well controlled today.  Current medicines were reviewed with the patient today.   Orders Placed This Encounter  Procedures  . ECHOCARDIOGRAM COMPLETE    Disposition: Follow-up in 6 months.  Signed, Jonelle Sidle, MD, [facility] 09/30/2018 10:11 AM    [facility] [address] [place], [place] [address] Phone: [phone]; Fax: [phone]

## 2018-09-30 NOTE — Patient Instructions (Signed)
Medication Instructions:  STOP Brilinta If you need a refill on your cardiac medications before your next appointment, please call your pharmacy.   Lab work: None today If you have labs (blood work) drawn today and your tests are completely normal, you will receive your results only by: Marland Kitchen MyChart Message (if you have MyChart) OR . A paper copy in the mail If you have any lab test that is abnormal or we need to change your treatment, we will call you to review the results.  Testing/Procedures: Your physician has requested that you have an echocardiogram. Echocardiography is a painless test that uses sound waves to create images of your heart. It provides your doctor with information about the size and shape of your heart and how well your heart's chambers and valves are working. This procedure takes approximately one hour. There are no restrictions for this procedure.    Follow-Up: At [facility], you and your health needs are our priority.  As part of our continuing mission to provide you with exceptional heart care, we have created designated Provider Care Teams.  These Care Teams include your primary Cardiologist (physician) and Advanced Practice Providers (APPs -  Physician Assistants and Nurse Practitioners) who all work together to provide you with the care you need, when you need it. You will need a follow up appointment in 6 months.  Please call our office 2 months in advance to schedule this appointment.  You may see Nona Dell, MD or one of the following Advanced Practice Providers on your designated Care Team:   Randall An, PA-C [facility]) . Jacolyn Reedy, PA-C [facility] Office)  Any Other Special Instructions Will Be Listed Below (If Applicable). None

## 2018-10-01 LAB — BLOOD CULTURE ID PANEL (REFLEXED)

## 2018-10-01 LAB — CULTURE, BLOOD (ROUTINE X 2)
Culture: NO GROWTH
Special Requests: ADEQUATE

## 2018-10-01 NOTE — ED Notes (Signed)
10/01/2018    1500  Dr little notified of 1 of 4 blood culture bottles pos for gram pos cocci  Other 3 bottles no groth,  Treatment in ed was all pt needed per md

## 2018-10-02 LAB — CULTURE, BLOOD (ROUTINE X 2): Special Requests: ADEQUATE

## 2018-10-03 ENCOUNTER — Telehealth: Payer: Self-pay | Admitting: Emergency Medicine

## 2018-10-03 NOTE — Telephone Encounter (Signed)
Post ED Visit - Positive Culture Follow-up  Culture report reviewed by antimicrobial stewardship pharmacist:  []  Enzo Bi, Pharm.D. []  Celedonio Miyamoto, Pharm.D., BCPS AQ-ID []  Garvin Fila, Pharm.D., BCPS []  Georgina Pillion, Pharm.D., BCPS []  [place], [address].D., BCPS, AAHIVP []  Estella Husk, Pharm.D., BCPS, AAHIVP [x]  Lysle Pearl, PharmD, BCPS []  Phillips Climes, PharmD, BCPS []  Agapito Games, PharmD, BCPS []  Verlan Friends, PharmD  Positive blood culture Contaminant, no further patient follow-up is required at this time.  Carollee Herter Quay Simkin 10/03/2018, 9:39 AM

## 2018-10-08 ENCOUNTER — Ambulatory Visit (HOSPITAL_COMMUNITY)
Admission: RE | Admit: 2018-10-08 | Discharge: 2018-10-08 | Disposition: A | Discharge status: Home / Self Care | Payer: Medicare Other | Source: Ambulatory Visit | Attending: Cardiology | Admitting: Cardiology

## 2018-10-08 DIAGNOSIS — R0602 Shortness of breath: Secondary | ICD-10-CM | POA: Diagnosis not present

## 2018-10-08 NOTE — Progress Notes (Signed)
*  PRELIMINARY RESULTS* Echocardiogram 2D Echocardiogram has been performed.  Stacey DrainWhite, Noah Lembke J 10/08/2018, 3:47 PM

## 2018-11-18 ENCOUNTER — Other Ambulatory Visit: Payer: Self-pay | Admitting: Cardiology

## 2018-12-18 ENCOUNTER — Other Ambulatory Visit: Payer: Self-pay | Admitting: Cardiology

## 2019-01-07 DIAGNOSIS — M25562 Pain in left knee: Secondary | ICD-10-CM | POA: Diagnosis not present

## 2019-01-07 DIAGNOSIS — M25561 Pain in right knee: Secondary | ICD-10-CM | POA: Diagnosis not present

## 2019-01-07 DIAGNOSIS — M170 Bilateral primary osteoarthritis of knee: Secondary | ICD-10-CM | POA: Diagnosis not present

## 2019-01-15 DIAGNOSIS — M25562 Pain in left knee: Secondary | ICD-10-CM | POA: Diagnosis not present

## 2019-01-15 DIAGNOSIS — M25561 Pain in right knee: Secondary | ICD-10-CM | POA: Diagnosis not present

## 2019-01-15 DIAGNOSIS — M170 Bilateral primary osteoarthritis of knee: Secondary | ICD-10-CM | POA: Diagnosis not present

## 2019-01-21 DIAGNOSIS — M25562 Pain in left knee: Secondary | ICD-10-CM | POA: Diagnosis not present

## 2019-01-21 DIAGNOSIS — M170 Bilateral primary osteoarthritis of knee: Secondary | ICD-10-CM | POA: Diagnosis not present

## 2019-01-21 DIAGNOSIS — M25561 Pain in right knee: Secondary | ICD-10-CM | POA: Diagnosis not present

## 2019-01-29 DIAGNOSIS — M25562 Pain in left knee: Secondary | ICD-10-CM | POA: Diagnosis not present

## 2019-01-29 DIAGNOSIS — M170 Bilateral primary osteoarthritis of knee: Secondary | ICD-10-CM | POA: Diagnosis not present

## 2019-01-29 DIAGNOSIS — M25561 Pain in right knee: Secondary | ICD-10-CM | POA: Diagnosis not present

## 2019-02-05 DIAGNOSIS — M25562 Pain in left knee: Secondary | ICD-10-CM | POA: Diagnosis not present

## 2019-02-05 DIAGNOSIS — M25561 Pain in right knee: Secondary | ICD-10-CM | POA: Diagnosis not present

## 2019-02-05 DIAGNOSIS — M25511 Pain in right shoulder: Secondary | ICD-10-CM | POA: Diagnosis not present

## 2019-02-05 DIAGNOSIS — M170 Bilateral primary osteoarthritis of knee: Secondary | ICD-10-CM | POA: Diagnosis not present

## 2019-02-12 DIAGNOSIS — R809 Proteinuria, unspecified: Secondary | ICD-10-CM | POA: Diagnosis not present

## 2019-02-12 DIAGNOSIS — I129 Hypertensive chronic kidney disease with stage 1 through stage 4 chronic kidney disease, or unspecified chronic kidney disease: Secondary | ICD-10-CM | POA: Diagnosis not present

## 2019-02-12 DIAGNOSIS — N2581 Secondary hyperparathyroidism of renal origin: Secondary | ICD-10-CM | POA: Diagnosis not present

## 2019-02-12 DIAGNOSIS — N183 Chronic kidney disease, stage 3 (moderate): Secondary | ICD-10-CM | POA: Diagnosis not present

## 2019-04-06 ENCOUNTER — Encounter: Payer: Self-pay | Admitting: *Deleted

## 2019-04-06 ENCOUNTER — Other Ambulatory Visit: Payer: Self-pay

## 2019-04-06 MED ORDER — HYDRALAZINE HCL 50 MG PO TABS: 50.0000 mg | ORAL_TABLET | Freq: Two times a day (BID) | ORAL | 3 refills | Status: DC

## 2019-04-06 NOTE — Telephone Encounter (Signed)
Refilled hydralazine 50 mg BIB    Pt 's daughter given free scale and BP monitor

## 2019-04-06 NOTE — Progress Notes (Signed)
Virtual Visit via Telephone Note   This visit type was conducted due to national recommendations for restrictions regarding the COVID-19 Pandemic (e.g. social distancing) in an effort to limit this patient's exposure and mitigate transmission in our community.  Due to her co-morbid illnesses, this patient is at least at moderate risk for complications without adequate follow up.  This format is felt to be most appropriate for this patient at this time.  The patient did not have access to video technology/had technical difficulties with video requiring transitioning to audio format only (telephone).  All issues noted in this document were discussed and addressed.  No physical exam could be performed with this format.  Please refer to the patient's chart for her  consent to telehealth for [facility].   Date:  04/07/2019   ID:  [name] Rinneearly M Johnson, DOB 12/23/1933, MRN [id number]  Patient Location: Home Provider Location: Office  PCP:  Assunta FoundGolding, John, MD  Cardiologist:  Nona DellSamuel Lanique Gonzalo, MD Electrophysiologist:  None   Evaluation Performed:  Follow-Up Visit  Chief Complaint:   Cardiac follow-up  History of Present Illness:    [name] is an 85 y.o. female last seen in January.  She did not have video access and we spoke by phone today.  I also spoke with her daughter.  Overall from a cardiac perspective she has been doing well.  No active angina symptoms and stable NYHA class II dyspnea.  She has been avoiding the high heat and humidity.  When she does go out of the house she wears a mask.  She was taken off Brilinta at the last visit.  I reviewed her remaining cardiac regimen medications which are stable and outlined below.  Follow-up echocardiogram in January revealed LVEF greater than 65% with mild diastolic dysfunction, mild aortic regurgitation with mild sclerosis but no stenosis.  I asked her about follow-up lipid panel in comparison to last July.  She has not had any interval lab  work and we will get this arranged.  The patient does not have symptoms concerning for COVID-19 infection (fever, chills, cough, or new shortness of breath).    Past Medical History:  Diagnosis Date  . Arthritis   . CKD (chronic kidney disease), stage III (HCC)   . Complete heart block (HCC) 11/16/2016   a. transient during NSTEMI, resolved with revascularization.  . Coronary artery disease 11/2009   a. with prior stent placement to the ramus intermedius and RCA. b. NSTEMI 11/2016 s/p DES to DES to distal Cx with moderate residual dz.  . Essential hypertension   . Mixed hyperlipidemia   . Non-ST elevation (NSTEMI) myocardial infarction (HCC) 11/16/2016  . Obesity   . Reflux esophagitis   . Sleep apnea 09/27/2010   Untreated, REM 64.7/hr AHI 18.5/hr RDI 19.2/hr. Patuent refused CPAP therapy   Past Surgical History:  Procedure Laterality Date  . CHOLECYSTECTOMY    . CORONARY ANGIOPLASTY WITH STENT PLACEMENT  2007   A [id number] CYPHER stent post dilated to 3.29 mm the 100% occlusion was reduced to 0%  . CORONARY STENT INTERVENTION N/A 11/16/2016   Procedure: Coronary Stent Intervention;  Surgeon: Tonny BollmanMichael Cooper, MD;  [facility];  Service: Cardiovascular;  Laterality: N/A;  . LEFT HEART CATH AND CORONARY ANGIOGRAPHY N/A 11/16/2016   Procedure: Left Heart Cath and Coronary Angiography;  Surgeon: Tonny BollmanMichael Cooper, MD;  [facility];  Service: Cardiovascular;  Laterality: N/A;  . LEFT HEART CATHETERIZATION WITH CORONARY ANGIOGRAM N/A 07/12/2014  Procedure: LEFT HEART CATHETERIZATION WITH CORONARY ANGIOGRAM;  Surgeon: Micheline Chapman, MD;  Location: [facility] CATH LAB;  Service: Cardiovascular;  Laterality: N/A;  . TEMPORARY PACEMAKER N/A 11/16/2016   Procedure: Temporary Pacemaker;  Surgeon: Tonny Bollman, MD;  Location: [facility] INVAS[facility] CV LAB;  Service: Cardiovascular;  Laterality: N/A;     Current Meds  Medication Sig  . amLODipine (NORVASC) 5 MG tablet TAKE 1 TABLET BY  MOUTH EVERY DAY (Patient taking differently: Take 5 mg by mouth daily. )  . aspirin 81 MG tablet Take 81 mg by mouth daily.  . furosemide (LASIX) 40 MG tablet TAKE 1 TABLET BY MOUTH DAILY AS NEEDED FOR EDEMA (Patient taking differently: Take 40 mg by mouth daily as needed for edema. )  . hydrALAZINE (APRESOLINE) 50 MG tablet Take 1 tablet (50 mg total) by mouth 2 (two) times daily.  Marland Kitchen HYDROcodone-acetaminophen (NORCO/VICODIN) 5-325 MG tablet Take 0.5-1 tablets every 6 hours as needed for pain.  Marland Kitchen losartan (COZAAR) 50 MG tablet TAKE 1 TABLET BY MOUTH EVERY DAY (Patient taking differently: Take 50 mg by mouth daily. )  . nitroGLYCERIN (NITROSTAT) 0.4 MG SL tablet Place 1 tablet (0.4 mg total) under the tongue every 5 (five) minutes as needed.  . pantoprazole (PROTONIX) 40 MG tablet TAKE 1 TABLET BY MOUTH EVERY DAY  . rosuvastatin (CRESTOR) 40 MG tablet TAKE 1 TABLET BY MOUTH EVERY DAY  . Vitamin D, Ergocalciferol, (DRISDOL) 50000 units CAPS capsule Take 50,000 Units by mouth every 30 (thirty) days.     Allergies:   Atorvastatin, Contrast media [iodinated diagnostic agents], Darvon [propoxyphene], and Codeine   Social History   Tobacco Use  . Smoking status: Never Smoker  . Smokeless tobacco: Never Used  Substance Use Topics  . Alcohol use: No  . Drug use: No     Family Hx: The patient's family history includes Hypertension in an other family member.  ROS:   Please see the history of present illness.    Chronic knee and cervical pain. All other systems reviewed and are negative.   Prior CV studies:   The following studies were reviewed today:  Cardiac catheterization and PCI 11/16/2016: 1. Critical stenosis of the distal circumflex treated successfully with PCI using a 2.5 mm drug-eluting stent 2. Moderate mid LAD stenosis, does not appear to be flow-obstructive 3. Moderate proximal RCA stenosis (nondominant vessel) 4. Patent stents in the RCA and ramus intermedius 5. Normal  LVEDP-will assess LV function by echo because of chronic kidney disease 6. Successful insertion of a temporary transvenous pacing wire for treatment of complete heart block  Echocardiogram 10/08/2018:  1. The left ventricle appears to be normal in size, has severe wall thickness <65% ejection fraction Spectral Doppler shows impaired relaxation pattern of diastolic filling.  2. Elevated left ventricular end-diastolic pressure.  3. Mitral valve regurgitation is trivial by color flow Doppler.  4. Moderate mitral annular calcification.  5. Aortic valve regurgitation is mild by color flow Doppler.  6. There is mild thickening of the aortic valve.  7. There is sclerosis without any evidence of stenosis of the aortic valve.  8. Moderate annular calcification.  Labs/Other Tests and Data Reviewed:    EKG:  An ECG dated 09/26/2018 was personally reviewed today and demonstrated:  Sinus rhythm with right bundle branch block and left anterior fascicular block.  Recent Labs: 09/26/2018: ALT 11; BUN 17; Creatinine, Ser 1.45; Hemoglobin 9.8; Platelets 284; Potassium 3.9; Sodium 139   Recent Lipid Panel Lab Results  Component Value Date/Time   CHOL 111 04/09/2018 08:21 AM   CHOL 146 01/19/2015 10:48 AM   TRIG 81 04/09/2018 08:21 AM   HDL 45 04/09/2018 08:21 AM   HDL 48 01/19/2015 10:48 AM   CHOLHDL 2.5 04/09/2018 08:21 AM   LDLCALC 50 04/09/2018 08:21 AM   LDLCALC 77 01/19/2015 10:48 AM    Wt Readings from Last 3 Encounters:  04/07/19 189 lb (85.7 kg)  09/30/18 183 lb (83 kg)  04/02/18 187 lb 3.2 oz (84.9 kg)     Objective:    Vital Signs:  BP (!) 154/74   Pulse 69   Wt 189 lb (85.7 kg)   BMI 31.45 kg/m    Patient spoke in full sentences, not short of breath. No audible wheezing or coughing. Speech pattern normal.  ASSESSMENT & PLAN:    1.  CAD status post DES to the distal circumflex in March 2018 with patent stent sites in the ramus intermedius and RCA.  She does not report any  active angina on medical therapy and we will continue with observation.  No changes made to current regimen.  Refill provided for as needed nitroglycerin.  2.  Mixed hyperlipidemia on Crestor.  Due for follow-up FLP and LFTs which will be arranged.  3.  CKD stage III, last creatinine 1.45.  4.  Essential hypertension, blood pressure mildly elevated today.  Continue with current medications and keep follow-up with PCP.  COVID-19 Education: The signs and symptoms of COVID-19 were discussed with the patient and how to seek care for testing (follow up with PCP or arrange E-visit).  The importance of social distancing was discussed today.  Time:   Today, I have spent 9 minutes with the patient with telehealth technology discussing the above problems.     Medication Adjustments/Labs and Tests Ordered: Current medicines are reviewed at length with the patient today.  Concerns regarding medicines are outlined above.   Tests Ordered: Orders Placed This Encounter  Procedures  . Lipid Profile  . Hepatic function panel    Medication Changes: No orders of the defined types were placed in this encounter.   Follow Up:  In Person 6 months in the [place] office.  Signed, Rozann Lesches, MD  04/07/2019 10:50 AM    [place]

## 2019-04-07 ENCOUNTER — Encounter: Payer: Self-pay | Admitting: Cardiology

## 2019-04-07 ENCOUNTER — Telehealth (INDEPENDENT_AMBULATORY_CARE_PROVIDER_SITE_OTHER): Payer: Medicare Other | Admitting: Cardiology

## 2019-04-07 VITALS — BP 154/74 | HR 69 | Wt 189.0 lb

## 2019-04-07 DIAGNOSIS — N183 Chronic kidney disease, stage 3 unspecified: Secondary | ICD-10-CM

## 2019-04-07 DIAGNOSIS — I10 Essential (primary) hypertension: Secondary | ICD-10-CM | POA: Diagnosis not present

## 2019-04-07 DIAGNOSIS — E782 Mixed hyperlipidemia: Secondary | ICD-10-CM

## 2019-04-07 DIAGNOSIS — I25119 Atherosclerotic heart disease of native coronary artery with unspecified angina pectoris: Secondary | ICD-10-CM | POA: Diagnosis not present

## 2019-04-07 NOTE — Patient Instructions (Signed)
Medication Instructions: Your physician recommends that you continue on your current medications as directed. Please refer to the Current Medication list given to you today.   Labwork: Fasting Lipids, and LFT's (liver function)  Procedures/Testing: None today  Follow-Up: 6 months with Dr.McDowell  Any Additional Special Instructions Will Be Listed Below (If Applicable).     If you need a refill on your cardiac medications before your next appointment, please call your pharmacy.      Thank you for choosing [facility] !

## 2019-04-09 DIAGNOSIS — M19011 Primary osteoarthritis, right shoulder: Secondary | ICD-10-CM | POA: Diagnosis not present

## 2019-04-09 DIAGNOSIS — M25511 Pain in right shoulder: Secondary | ICD-10-CM | POA: Diagnosis not present

## 2019-04-15 ENCOUNTER — Telehealth: Payer: Self-pay

## 2019-04-15 MED ORDER — NITROGLYCERIN 0.4 MG SL SUBL: 0.4000 mg | SUBLINGUAL_TABLET | SUBLINGUAL | 3 refills | Status: DC | PRN

## 2019-04-15 NOTE — Telephone Encounter (Signed)
Sent nitro refill to [facility].

## 2019-04-16 ENCOUNTER — Telehealth: Payer: Self-pay | Admitting: Cardiology

## 2019-04-16 NOTE — Telephone Encounter (Signed)
I am not exactly sure I understand the need for such a letter.  [name] is by definition a higher risk patient because of her age and cardiovascular disease, whether or not she actually resides with her daughter.

## 2019-04-16 NOTE — Telephone Encounter (Signed)
Pt will pick up letter

## 2019-04-16 NOTE — Telephone Encounter (Signed)
Forward to Dr. McDowell 

## 2019-04-16 NOTE — Telephone Encounter (Signed)
I called pt to gain more clarity of the need for a letter. 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 One of which is if she resides in a household with someone that is a high risk, which her mother is. The patients daughter fears that she will bring the virus home from work and infect her mother. She asks that you provide a note stating only that her mother is indeed high risk according to the CDC guidelines, so that she may give it to her employer. Please advise.

## 2019-04-16 NOTE — Telephone Encounter (Signed)
Okay.  If you could please help me with a form letter through [employer] that states the following:  Ms. Divine Imber. Karapetyan (1934-02-14) is a patient of mine followed through [employer].  Based on current CDC definitions, she is at increased risk of severe illness from COVID-19 based on the fact that she is 85 years old and has a history of cardiovascular disease and also chronic kidney disease stage III.

## 2019-04-16 NOTE — Telephone Encounter (Signed)
Pt's daughter is needing a letter to give to her employer stating that her mother is at a high risk for COVID  according to the CDC guidelines because the reside in the same household.

## 2019-05-14 ENCOUNTER — Other Ambulatory Visit (HOSPITAL_COMMUNITY)
Admission: RE | Admit: 2019-05-14 | Discharge: 2019-05-14 | Disposition: A | Discharge status: Home / Self Care | Payer: Medicare Other | Source: Ambulatory Visit | Attending: Cardiology | Admitting: Cardiology

## 2019-05-14 DIAGNOSIS — I25119 Atherosclerotic heart disease of native coronary artery with unspecified angina pectoris: Secondary | ICD-10-CM | POA: Diagnosis not present

## 2019-05-14 DIAGNOSIS — E782 Mixed hyperlipidemia: Secondary | ICD-10-CM | POA: Diagnosis not present

## 2019-05-14 LAB — HEPATIC FUNCTION PANEL
ALT: 11 U/L (ref 0–44)
AST: 18 U/L (ref 15–41)
Albumin: 3.5 g/dL (ref 3.5–5.0)
Alkaline Phosphatase: 66 U/L (ref 38–126)
Bilirubin, Direct: <0.1 mg/dL (ref 0.0–0.2)
Total Bilirubin: 0.3 mg/dL (ref 0.3–1.2)
Total Protein: 7.3 g/dL (ref 6.5–8.1)

## 2019-05-14 LAB — LIPID PANEL
Cholesterol: 120 mg/dL (ref 0–200)
HDL: 37 mg/dL — ABNORMAL LOW (ref >40)
LDL Cholesterol: 55 mg/dL (ref 0–99)
Total CHOL/HDL Ratio: 3.2 RATIO
Triglycerides: 140 mg/dL (ref <150)
VLDL: 28 mg/dL (ref 0–40)

## 2019-05-28 DIAGNOSIS — M25461 Effusion, right knee: Secondary | ICD-10-CM | POA: Diagnosis not present

## 2019-05-28 DIAGNOSIS — M25561 Pain in right knee: Secondary | ICD-10-CM | POA: Diagnosis not present

## 2019-05-28 DIAGNOSIS — M1711 Unilateral primary osteoarthritis, right knee: Secondary | ICD-10-CM | POA: Diagnosis not present

## 2019-05-28 DIAGNOSIS — M170 Bilateral primary osteoarthritis of knee: Secondary | ICD-10-CM | POA: Diagnosis not present

## 2019-05-28 DIAGNOSIS — M25562 Pain in left knee: Secondary | ICD-10-CM | POA: Diagnosis not present

## 2019-06-02 DIAGNOSIS — F411 Generalized anxiety disorder: Secondary | ICD-10-CM | POA: Diagnosis not present

## 2019-06-02 DIAGNOSIS — J019 Acute sinusitis, unspecified: Secondary | ICD-10-CM | POA: Diagnosis not present

## 2019-06-02 DIAGNOSIS — Z6832 Body mass index (BMI) 32.0-32.9, adult: Secondary | ICD-10-CM | POA: Diagnosis not present

## 2019-06-02 DIAGNOSIS — K5792 Diverticulitis of intestine, part unspecified, without perforation or abscess without bleeding: Secondary | ICD-10-CM | POA: Diagnosis not present

## 2019-06-04 DIAGNOSIS — M1712 Unilateral primary osteoarthritis, left knee: Secondary | ICD-10-CM | POA: Diagnosis not present

## 2019-06-04 DIAGNOSIS — M25562 Pain in left knee: Secondary | ICD-10-CM | POA: Diagnosis not present

## 2019-06-04 DIAGNOSIS — M25462 Effusion, left knee: Secondary | ICD-10-CM | POA: Diagnosis not present

## 2019-06-11 DIAGNOSIS — M25561 Pain in right knee: Secondary | ICD-10-CM | POA: Diagnosis not present

## 2019-06-11 DIAGNOSIS — M170 Bilateral primary osteoarthritis of knee: Secondary | ICD-10-CM | POA: Diagnosis not present

## 2019-06-11 DIAGNOSIS — M25562 Pain in left knee: Secondary | ICD-10-CM | POA: Diagnosis not present

## 2019-06-15 ENCOUNTER — Other Ambulatory Visit: Payer: Self-pay | Admitting: Cardiology

## 2019-06-16 DIAGNOSIS — Z0001 Encounter for general adult medical examination with abnormal findings: Secondary | ICD-10-CM | POA: Diagnosis not present

## 2019-06-16 DIAGNOSIS — E782 Mixed hyperlipidemia: Secondary | ICD-10-CM | POA: Diagnosis not present

## 2019-06-16 DIAGNOSIS — Z0000 Encounter for general adult medical examination without abnormal findings: Secondary | ICD-10-CM | POA: Diagnosis not present

## 2019-06-16 DIAGNOSIS — Z1389 Encounter for screening for other disorder: Secondary | ICD-10-CM | POA: Diagnosis not present

## 2019-06-16 DIAGNOSIS — N183 Chronic kidney disease, stage 3 unspecified: Secondary | ICD-10-CM | POA: Diagnosis not present

## 2019-06-16 DIAGNOSIS — Z6831 Body mass index (BMI) 31.0-31.9, adult: Secondary | ICD-10-CM | POA: Diagnosis not present

## 2019-06-16 DIAGNOSIS — E6609 Other obesity due to excess calories: Secondary | ICD-10-CM | POA: Diagnosis not present

## 2019-07-05 ENCOUNTER — Other Ambulatory Visit: Payer: Self-pay | Admitting: Cardiovascular Disease

## 2019-07-31 DIAGNOSIS — R809 Proteinuria, unspecified: Secondary | ICD-10-CM | POA: Insufficient documentation

## 2019-07-31 DIAGNOSIS — N2581 Secondary hyperparathyroidism of renal origin: Secondary | ICD-10-CM | POA: Diagnosis not present

## 2019-07-31 DIAGNOSIS — N1832 Chronic kidney disease, stage 3b: Secondary | ICD-10-CM | POA: Diagnosis present

## 2019-07-31 DIAGNOSIS — I129 Hypertensive chronic kidney disease with stage 1 through stage 4 chronic kidney disease, or unspecified chronic kidney disease: Secondary | ICD-10-CM | POA: Diagnosis not present

## 2019-08-04 ENCOUNTER — Other Ambulatory Visit: Payer: Self-pay

## 2019-08-04 DIAGNOSIS — Z20828 Contact with and (suspected) exposure to other viral communicable diseases: Secondary | ICD-10-CM | POA: Diagnosis not present

## 2019-08-04 DIAGNOSIS — Z20822 Contact with and (suspected) exposure to covid-19: Secondary | ICD-10-CM

## 2019-08-05 LAB — NOVEL CORONAVIRUS, NAA: SARS-CoV-2, NAA: NOT DETECTED

## 2019-08-26 DIAGNOSIS — E6609 Other obesity due to excess calories: Secondary | ICD-10-CM | POA: Diagnosis not present

## 2019-08-26 DIAGNOSIS — Z6831 Body mass index (BMI) 31.0-31.9, adult: Secondary | ICD-10-CM | POA: Diagnosis not present

## 2019-08-26 DIAGNOSIS — J019 Acute sinusitis, unspecified: Secondary | ICD-10-CM | POA: Diagnosis not present

## 2019-09-09 ENCOUNTER — Other Ambulatory Visit: Payer: Self-pay

## 2019-09-09 ENCOUNTER — Ambulatory Visit: Payer: Medicare Other | Attending: Internal Medicine

## 2019-09-09 DIAGNOSIS — Z20822 Contact with and (suspected) exposure to covid-19: Secondary | ICD-10-CM

## 2019-09-10 LAB — NOVEL CORONAVIRUS, NAA: SARS-CoV-2, NAA: NOT DETECTED

## 2019-09-17 DIAGNOSIS — M25561 Pain in right knee: Secondary | ICD-10-CM | POA: Diagnosis not present

## 2019-09-17 DIAGNOSIS — M25562 Pain in left knee: Secondary | ICD-10-CM | POA: Diagnosis not present

## 2019-09-17 DIAGNOSIS — M170 Bilateral primary osteoarthritis of knee: Secondary | ICD-10-CM | POA: Diagnosis not present

## 2019-09-23 DIAGNOSIS — M25511 Pain in right shoulder: Secondary | ICD-10-CM | POA: Diagnosis not present

## 2019-09-30 DIAGNOSIS — M170 Bilateral primary osteoarthritis of knee: Secondary | ICD-10-CM | POA: Diagnosis not present

## 2019-09-30 DIAGNOSIS — M25561 Pain in right knee: Secondary | ICD-10-CM | POA: Diagnosis not present

## 2019-09-30 DIAGNOSIS — M25562 Pain in left knee: Secondary | ICD-10-CM | POA: Diagnosis not present

## 2019-10-07 ENCOUNTER — Encounter: Payer: Self-pay | Admitting: Cardiology

## 2019-10-07 ENCOUNTER — Other Ambulatory Visit: Payer: Self-pay

## 2019-10-07 ENCOUNTER — Ambulatory Visit (INDEPENDENT_AMBULATORY_CARE_PROVIDER_SITE_OTHER): Payer: Medicare Other | Admitting: Cardiology

## 2019-10-07 VITALS — BP 134/74 | HR 88 | Ht 65.0 in | Wt 189.0 lb

## 2019-10-07 DIAGNOSIS — I25119 Atherosclerotic heart disease of native coronary artery with unspecified angina pectoris: Secondary | ICD-10-CM | POA: Diagnosis not present

## 2019-10-07 DIAGNOSIS — N1832 Chronic kidney disease, stage 3b: Secondary | ICD-10-CM | POA: Diagnosis not present

## 2019-10-07 DIAGNOSIS — E782 Mixed hyperlipidemia: Secondary | ICD-10-CM

## 2019-10-07 DIAGNOSIS — I10 Essential (primary) hypertension: Secondary | ICD-10-CM | POA: Diagnosis not present

## 2019-10-07 NOTE — Patient Instructions (Signed)

## 2019-10-07 NOTE — Progress Notes (Signed)
Cardiology Office Note  Date: 10/07/2019   ID: [name] Dec 23, 1933, MRN [id number]  PCP:  Cory Munch, PA-C  Cardiologist:  Rozann Lesches, MD Electrophysiologist:  None   Chief Complaint  Patient presents with  . Cardiac follow-up    History of Present Illness: [name] is an 86 y.o. female last assessed via telehealth encounter in July 2020.  She is here today with her daughter for a follow-up visit.  She tells me that she has been very careful during the pandemic, stays at home the majority of the time and wears a mask when she goes out.  At this point she does not plan to get the vaccine, we did discuss this some today.  She does not describe any typical angina symptoms.  She has intermittent back and shoulder pain that sounds more musculoskeletal in description.  No progressive dyspnea on exertion, no palpitations or syncope.  She is following with Nephrology, I reviewed the note from November 2020.  I went over her most recent lab work, lipids were well controlled as of September of last year with LDL 55.  Cardiac regimen is stable as outlined below.  She has not used any nitroglycerin recently.  No obvious intolerances.  I personally reviewed her ECG today which shows sinus arrhythmia with incomplete right bundle branch block and left anterior fascicular block (old), old lateral infarct pattern.  Past Medical History:  Diagnosis Date  . Arthritis   . CKD (chronic kidney disease), stage III   . Complete heart block ([place]) 11/16/2016   a. transient during NSTEMI, resolved with revascularization.  . Coronary artery disease 11/2009   a. with prior stent placement to the ramus intermedius and RCA. b. NSTEMI 11/2016 s/p DES to DES to distal Cx with moderate residual dz.  . Essential hypertension   . Mixed hyperlipidemia   . Non-ST elevation (NSTEMI) myocardial infarction ([place]) 11/16/2016  . Obesity   . Reflux esophagitis   . Sleep apnea 09/27/2010    Untreated, REM 64.7/hr AHI 18.5/hr RDI 19.2/hr. Patuent refused CPAP therapy    Past Surgical History:  Procedure Laterality Date  . CHOLECYSTECTOMY    . CORONARY ANGIOPLASTY WITH STENT PLACEMENT  2007   A 3.0x83mm CYPHER stent post dilated to 3.29 mm the 100% occlusion was reduced to 0%  . CORONARY STENT INTERVENTION N/A 11/16/2016   Procedure: Coronary Stent Intervention;  Surgeon: Sherren Mocha, MD;  Location: [facility];  Service: Cardiovascular;  Laterality: N/A;  . LEFT HEART CATH AND CORONARY ANGIOGRAPHY N/A 11/16/2016   Procedure: Left Heart Cath and Coronary Angiography;  Surgeon: Sherren Mocha, MD;  Location: [facility] [facility];  Service: Cardiovascular;  Laterality: N/A;  . LEFT HEART CATHETERIZATION WITH CORONARY ANGIOGRAM N/A 07/12/2014   Procedure: LEFT HEART CATHETERIZATION WITH CORONARY ANGIOGRAM;  Surgeon: Blane Ohara, MD;  Location: [facility];  Service: Cardiovascular;  Laterality: N/A;  . TEMPORARY PACEMAKER N/A 11/16/2016   Procedure: Temporary Pacemaker;  Surgeon: Sherren Mocha, MD;  Location: [place];  Service: Cardiovascular;  Laterality: N/A;    Current Outpatient Medications  Medication Sig Dispense Refill  . amLODipine (NORVASC) 5 MG tablet Take 1 tablet (5 mg total) by mouth daily. 90 tablet 3  . aspirin 81 MG tablet Take 81 mg by mouth daily.    . furosemide (LASIX) 40 MG tablet TAKE 1 TABLET BY MOUTH DAILY AS NEEDED FOR EDEMA (Patient taking differently: Take 40 mg by mouth  daily as needed for edema. ) 30 tablet 0  . hydrALAZINE (APRESOLINE) 50 MG tablet Take 1 tablet (50 mg total) by mouth 2 (two) times daily. 180 tablet 3  . losartan (COZAAR) 50 MG tablet TAKE 1 TABLET BY MOUTH EVERY DAY 90 tablet 3  . nitroGLYCERIN (NITROSTAT) 0.4 MG SL tablet Place 1 tablet (0.4 mg total) under the tongue every 5 (five) minutes as needed. 25 tablet 3  . pantoprazole (PROTONIX) 40 MG tablet TAKE 1 TABLET BY MOUTH EVERY DAY 90 tablet 1  . rosuvastatin  (CRESTOR) 40 MG tablet TAKE 1 TABLET BY MOUTH EVERY DAY 90 tablet 3  . Vitamin D, Ergocalciferol, (DRISDOL) 50000 units CAPS capsule Take 50,000 Units by mouth every 30 (thirty) days.  0   No current facility-administered medications for this visit.   Allergies:  Atorvastatin, Contrast media [iodinated diagnostic agents], Darvon [propoxyphene], and Codeine   Social History: The patient  reports that she has never smoked. She has never used smokeless tobacco. She reports that she does not drink alcohol or use drugs.   ROS:  Please see the history of present illness. Otherwise, complete review of systems is positive for chronic arthritic pains.  All other systems are reviewed and negative.   Physical Exam: VS:  BP 134/74   Pulse 88   Ht 5\' 5"  (1.651 m)   Wt 189 lb (85.7 kg)   SpO2 98%   BMI 31.45 kg/m , BMI Body mass index is 31.45 kg/m.  Wt Readings from Last 3 Encounters:  10/07/19 189 lb (85.7 kg)  04/07/19 189 lb (85.7 kg)  09/30/18 183 lb (83 kg)    General: Patient appears comfortable at rest. HEENT: Conjunctiva and lids normal, wearing a mask. Neck: Supple, no elevated JVP or carotid bruits, no thyromegaly. Lungs: Clear to auscultation, nonlabored breathing at rest. Cardiac: Regular rate and rhythm, no S3 or significant systolic murmur. Extremities: No pitting edema, distal pulses 2+. Skin: Warm and dry. Musculoskeletal: No kyphosis. Neuropsychiatric: Alert and oriented x3, affect grossly appropriate.  ECG:  An ECG dated 09/26/2018 was personally reviewed today and demonstrated:  Sinus rhythm with right bundle branch block and left anterior fascicular block.  Recent Labwork: 05/14/2019: ALT 11; AST 18     Component Value Date/Time   CHOL 120 05/14/2019 0845   CHOL 146 01/19/2015 1048   TRIG 140 05/14/2019 0845   HDL 37 (L) 05/14/2019 0845   HDL 48 01/19/2015 1048   CHOLHDL 3.2 05/14/2019 0845   VLDL 28 05/14/2019 0845   LDLCALC 55 05/14/2019 0845   LDLCALC 77  01/19/2015 1048    Other Studies Reviewed Today:  Cardiac catheterization and PCI 11/16/2016: 1. Critical stenosis of the distal circumflex treated successfully with PCI using a 2.5 mm drug-eluting stent 2. Moderate mid LAD stenosis, does not appear to be flow-obstructive 3. Moderate proximal RCA stenosis (nondominant vessel) 4. Patent stents in the RCA and ramus intermedius 5. Normal LVEDP-will assess LV function by echo because of chronic kidney disease 6. Successful insertion of a temporary transvenous pacing wire for treatment of complete heart block  Echocardiogram 10/08/2018: 1. The left ventricle appears to be normal in size, has severe wall thickness <65% ejection fraction Spectral Doppler shows impaired relaxation pattern of diastolic filling. 2. Elevated left ventricular end-diastolic pressure. 3. Mitral valve regurgitation is trivial by color flow Doppler. 4. Moderate mitral annular calcification. 5. Aortic valve regurgitation is mild by color flow Doppler. 6. There is mild thickening of the aortic valve.  7. There is sclerosis without any evidence of stenosis of the aortic valve. 8. Moderate annular calcification.  Assessment and Plan:  1.  CAD status post DES to the distal circumflex in March 2018 with patent stent sites in the ramus intermedius and RCA.  She remains clinically stable on medical therapy including aspirin, Norvasc, Cozaar, and Crestor.  ECG reviewed and stable.  Continue with observation.  2.  Mixed hyperlipidemia on Crestor.  Last LDL 55 and LFTs normal.  3.  Essential hypertension, systolic is in the 130s today.  No changes made to current regimen.  4.  CKD stage IIIb, following with nephrology.  Medication Adjustments/Labs and Tests Ordered: Current medicines are reviewed at length with the patient today.  Concerns regarding medicines are outlined above.   Tests Ordered: Orders Placed This Encounter  Procedures  . EKG 12-Lead     Medication Changes: No orders of the defined types were placed in this encounter.   Disposition:  Follow up 6 months in the [place] office.  Signed, Jonelle Sidle, MD, [facility] 10/07/2019 3:10 PM    [facility] at [facility] [address] [place], [place] [address] Phone: [phone]; Fax: [phone]

## 2019-10-11 DIAGNOSIS — N1831 Chronic kidney disease, stage 3a: Secondary | ICD-10-CM | POA: Diagnosis not present

## 2019-10-11 DIAGNOSIS — E7849 Other hyperlipidemia: Secondary | ICD-10-CM | POA: Diagnosis not present

## 2019-10-11 DIAGNOSIS — I129 Hypertensive chronic kidney disease with stage 1 through stage 4 chronic kidney disease, or unspecified chronic kidney disease: Secondary | ICD-10-CM | POA: Diagnosis not present

## 2019-10-15 DIAGNOSIS — Z0189 Encounter for other specified special examinations: Secondary | ICD-10-CM | POA: Diagnosis not present

## 2019-10-15 DIAGNOSIS — M19011 Primary osteoarthritis, right shoulder: Secondary | ICD-10-CM | POA: Diagnosis not present

## 2019-10-15 DIAGNOSIS — Z7689 Persons encountering health services in other specified circumstances: Secondary | ICD-10-CM | POA: Diagnosis not present

## 2019-10-15 DIAGNOSIS — Z789 Other specified health status: Secondary | ICD-10-CM | POA: Diagnosis not present

## 2019-10-15 DIAGNOSIS — M25562 Pain in left knee: Secondary | ICD-10-CM | POA: Diagnosis not present

## 2019-10-15 DIAGNOSIS — M25511 Pain in right shoulder: Secondary | ICD-10-CM | POA: Diagnosis not present

## 2019-10-15 DIAGNOSIS — M170 Bilateral primary osteoarthritis of knee: Secondary | ICD-10-CM | POA: Diagnosis not present

## 2019-10-15 DIAGNOSIS — M25561 Pain in right knee: Secondary | ICD-10-CM | POA: Diagnosis not present

## 2019-10-15 DIAGNOSIS — Z79899 Other long term (current) drug therapy: Secondary | ICD-10-CM | POA: Diagnosis not present

## 2019-10-22 DIAGNOSIS — M170 Bilateral primary osteoarthritis of knee: Secondary | ICD-10-CM | POA: Diagnosis not present

## 2019-10-22 DIAGNOSIS — M25562 Pain in left knee: Secondary | ICD-10-CM | POA: Diagnosis not present

## 2019-10-22 DIAGNOSIS — M25561 Pain in right knee: Secondary | ICD-10-CM | POA: Diagnosis not present

## 2019-10-26 ENCOUNTER — Other Ambulatory Visit: Payer: Self-pay

## 2019-10-26 ENCOUNTER — Other Ambulatory Visit: Payer: Self-pay | Admitting: Cardiology

## 2019-10-26 NOTE — Telephone Encounter (Signed)
10-26-19/Daughter states Nitro need prescription updated with Pharmacy.  Thanks renee

## 2019-10-27 MED ORDER — NITROGLYCERIN 0.4 MG SL SUBL: 0.4000 mg | SUBLINGUAL_TABLET | SUBLINGUAL | 3 refills | Status: DC | PRN

## 2019-10-27 NOTE — Telephone Encounter (Signed)
Refill for nitro sent to pharmacy.  

## 2019-11-06 DIAGNOSIS — Z23 Encounter for immunization: Secondary | ICD-10-CM | POA: Diagnosis not present

## 2019-11-09 DIAGNOSIS — H401131 Primary open-angle glaucoma, bilateral, mild stage: Secondary | ICD-10-CM | POA: Diagnosis not present

## 2019-12-04 DIAGNOSIS — Z23 Encounter for immunization: Secondary | ICD-10-CM | POA: Diagnosis not present

## 2019-12-09 DIAGNOSIS — E7849 Other hyperlipidemia: Secondary | ICD-10-CM | POA: Diagnosis not present

## 2019-12-09 DIAGNOSIS — N1831 Chronic kidney disease, stage 3a: Secondary | ICD-10-CM | POA: Diagnosis not present

## 2019-12-09 DIAGNOSIS — I129 Hypertensive chronic kidney disease with stage 1 through stage 4 chronic kidney disease, or unspecified chronic kidney disease: Secondary | ICD-10-CM | POA: Diagnosis not present

## 2019-12-17 DIAGNOSIS — M25511 Pain in right shoulder: Secondary | ICD-10-CM | POA: Diagnosis not present

## 2019-12-17 DIAGNOSIS — M19011 Primary osteoarthritis, right shoulder: Secondary | ICD-10-CM | POA: Diagnosis not present

## 2019-12-23 DIAGNOSIS — M25461 Effusion, right knee: Secondary | ICD-10-CM | POA: Diagnosis not present

## 2019-12-23 DIAGNOSIS — M25462 Effusion, left knee: Secondary | ICD-10-CM | POA: Diagnosis not present

## 2020-01-21 DIAGNOSIS — M25562 Pain in left knee: Secondary | ICD-10-CM | POA: Diagnosis not present

## 2020-01-21 DIAGNOSIS — M170 Bilateral primary osteoarthritis of knee: Secondary | ICD-10-CM | POA: Diagnosis not present

## 2020-01-21 DIAGNOSIS — M25561 Pain in right knee: Secondary | ICD-10-CM | POA: Diagnosis not present

## 2020-01-21 DIAGNOSIS — M19011 Primary osteoarthritis, right shoulder: Secondary | ICD-10-CM | POA: Diagnosis not present

## 2020-01-21 DIAGNOSIS — M25511 Pain in right shoulder: Secondary | ICD-10-CM | POA: Diagnosis not present

## 2020-01-26 DIAGNOSIS — E6609 Other obesity due to excess calories: Secondary | ICD-10-CM | POA: Diagnosis not present

## 2020-01-26 DIAGNOSIS — Z6831 Body mass index (BMI) 31.0-31.9, adult: Secondary | ICD-10-CM | POA: Diagnosis not present

## 2020-01-26 DIAGNOSIS — N1831 Chronic kidney disease, stage 3a: Secondary | ICD-10-CM | POA: Diagnosis not present

## 2020-01-26 DIAGNOSIS — J019 Acute sinusitis, unspecified: Secondary | ICD-10-CM | POA: Diagnosis not present

## 2020-01-27 DIAGNOSIS — M25561 Pain in right knee: Secondary | ICD-10-CM | POA: Diagnosis not present

## 2020-01-27 DIAGNOSIS — M25562 Pain in left knee: Secondary | ICD-10-CM | POA: Diagnosis not present

## 2020-01-27 DIAGNOSIS — M170 Bilateral primary osteoarthritis of knee: Secondary | ICD-10-CM | POA: Diagnosis not present

## 2020-02-01 DIAGNOSIS — R809 Proteinuria, unspecified: Secondary | ICD-10-CM | POA: Diagnosis not present

## 2020-02-01 DIAGNOSIS — N2581 Secondary hyperparathyroidism of renal origin: Secondary | ICD-10-CM | POA: Diagnosis not present

## 2020-02-01 DIAGNOSIS — N1832 Chronic kidney disease, stage 3b: Secondary | ICD-10-CM | POA: Diagnosis not present

## 2020-02-01 DIAGNOSIS — I129 Hypertensive chronic kidney disease with stage 1 through stage 4 chronic kidney disease, or unspecified chronic kidney disease: Secondary | ICD-10-CM | POA: Diagnosis not present

## 2020-03-04 ENCOUNTER — Other Ambulatory Visit: Payer: Self-pay | Admitting: Cardiology

## 2020-03-11 ENCOUNTER — Ambulatory Visit (INDEPENDENT_AMBULATORY_CARE_PROVIDER_SITE_OTHER): Payer: Medicare Other | Admitting: Cardiology

## 2020-03-11 ENCOUNTER — Encounter: Payer: Self-pay | Admitting: Cardiology

## 2020-03-11 VITALS — BP 152/70 | HR 84 | Ht 65.0 in | Wt 190.6 lb

## 2020-03-11 DIAGNOSIS — I25119 Atherosclerotic heart disease of native coronary artery with unspecified angina pectoris: Secondary | ICD-10-CM | POA: Diagnosis not present

## 2020-03-11 DIAGNOSIS — E782 Mixed hyperlipidemia: Secondary | ICD-10-CM

## 2020-03-11 NOTE — Patient Instructions (Signed)
Medication Instructions:  °Your physician recommends that you continue on your current medications as directed. Please refer to the Current Medication list given to you today. ° °*If you need a refill on your cardiac medications before your next appointment, please call your pharmacy* ° ° °Lab Work: °None today °If you have labs (blood work) drawn today and your tests are completely normal, you will receive your results only by: °• MyChart Message (if you have MyChart) OR °• A paper copy in the mail °If you have any lab test that is abnormal or we need to change your treatment, we will call you to review the results. ° ° °Testing/Procedures: °None today ° ° °Follow-Up: °At [facility], you and your health needs are our priority.  As part of our continuing mission to provide you with exceptional heart care, we have created designated Provider Care Teams.  These Care Teams include your primary Cardiologist (physician) and Advanced Practice Providers (APPs -  Physician Assistants and Nurse Practitioners) who all work together to provide you with the care you need, when you need it. ° °We recommend signing up for the patient portal called "MyChart".  Sign up information is provided on this After Visit Summary.  MyChart is used to connect with patients for Virtual Visits (Telemedicine).  Patients are able to view lab/test results, encounter notes, upcoming appointments, etc.  Non-urgent messages can be sent to your provider as well.   °To learn more about what you can do with MyChart, go to [email or web address].   ° °Your next appointment:   °6 month(s) ° °The format for your next appointment:   °In Person ° °Provider:   °Samuel McDowell, MD ° ° °Other Instructions °None ° ° ° ° °Thank you for choosing [facility] ! ° ° ° ° ° ° ° ° °

## 2020-03-11 NOTE — Progress Notes (Signed)
Cardiology Office Note  Date: 03/11/2020   ID: JASMA SEEVERS, DOB Apr 10, 1934, MRN [id number]  PCP:  Shawnie Dapper, PA-C  Cardiologist:  Nona Dell, MD Electrophysiologist:  None   Chief Complaint  Patient presents with  . Cardiac follow-up    History of Present Illness: [name] is an 86 y.o. female last seen in January.  She is here today with her daughter for a follow-up visit.  She does not describe any progressive angina symptoms or nitroglycerin use.  Mainly limited by orthopedic symptoms, neck and right shoulder pain, also bilateral knee pain.  She follows with a specialist and does have intermittent injections for pain control.  I reviewed her cardiac regimen which is stable and outlined below.  We are requesting her interval lab work from PCP.  She continues to follow with nephrology, I reviewed the note from May.  She has CKD stage IIIb.  I reviewed her ECG from January.  She did ultimately get the coronavirus vaccine and tolerated it well.  Past Medical History:  Diagnosis Date  . Arthritis   . CKD (chronic kidney disease), stage III   . Complete heart block ([facility]) 11/16/2016   a. transient during NSTEMI, resolved with revascularization.  . Coronary artery disease 11/2009   a. with prior stent placement to the ramus intermedius and RCA. b. NSTEMI 11/2016 s/p DES to DES to distal Cx with moderate residual dz.  . Essential hypertension   . Mixed hyperlipidemia   . Non-ST elevation (NSTEMI) myocardial infarction ([facility]) 11/16/2016  . Obesity   . Reflux esophagitis   . Sleep apnea 09/27/2010   Untreated, REM 64.7/hr AHI 18.5/hr RDI 19.2/hr. Patuent refused CPAP therapy    Past Surgical History:  Procedure Laterality Date  . CHOLECYSTECTOMY    . CORONARY ANGIOPLASTY WITH STENT PLACEMENT  2007   A 3.0x66mm CYPHER stent post dilated to 3.29 mm the 100% occlusion was reduced to 0%  . CORONARY STENT INTERVENTION N/A 11/16/2016   Procedure: Coronary Stent  Intervention;  Surgeon: Tonny Bollman, MD;  Location: [facility] INVASIVE [facility] LAB;  Service: Cardiovascular;  Laterality: N/A;  . LEFT HEART CATH AND CORONARY ANGIOGRAPHY N/A 11/16/2016   Procedure: Left Heart Cath and Coronary Angiography;  Surgeon: Tonny Bollman, MD;  Location: [facility] INVASIVE CV LAB;  Service: Cardiovascular;  Laterality: N/A;  . LEFT HEART CATHETERIZATION WITH CORONARY ANGIOGRAM N/A 07/12/2014   Procedure: LEFT HEART CATHETERIZATION WITH CORONARY ANGIOGRAM;  Surgeon: Micheline Chapman, MD;  Location: [facility] CATH LAB;  Service: Cardiovascular;  Laterality: N/A;  . TEMPORARY PACEMAKER N/A 11/16/2016   Procedure: Temporary Pacemaker;  Surgeon: Tonny Bollman, MD;  Location: [facility] INVASIVE CV LAB;  Service: Cardiovascular;  Laterality: N/A;    Current Outpatient Medications  Medication Sig Dispense Refill  . amLODipine (NORVASC) 5 MG tablet Take 1 tablet (5 mg total) by mouth daily. 90 tablet 3  . aspirin 81 MG tablet Take 81 mg by mouth daily.    . furosemide (LASIX) 40 MG tablet TAKE 1 TABLET BY MOUTH DAILY AS NEEDED FOR EDEMA (Patient taking differently: Take 40 mg by mouth daily as needed for edema. ) 30 tablet 0  . hydrALAZINE (APRESOLINE) 50 MG tablet Take 1 tablet (50 mg total) by mouth 2 (two) times daily. 180 tablet 3  . losartan (COZAAR) 50 MG tablet TAKE 1 TABLET BY MOUTH EVERY DAY 90 tablet 3  . nitroGLYCERIN (NITROSTAT) 0.4 MG SL tablet PLACE 1 TABLET (0.4 MG TOTAL) UNDER THE TONGUE  EVERY 5 (FIVE) MINUTES AS NEEDED. 75 tablet 1  . pantoprazole (PROTONIX) 40 MG tablet TAKE 1 TABLET BY MOUTH EVERY DAY 90 tablet 1  . rosuvastatin (CRESTOR) 40 MG tablet TAKE 1 TABLET BY MOUTH EVERY DAY 90 tablet 3  . Vitamin D, Ergocalciferol, (DRISDOL) 50000 units CAPS capsule Take 50,000 Units by mouth every 30 (thirty) days.  0   No current facility-administered medications for this visit.   Allergies:  Atorvastatin, Contrast media [iodinated diagnostic agents], Darvon [propoxyphene], and Codeine   ROS:    No palpitations or syncope.  Physical Exam: VS:  BP (!) 152/70   Pulse 84   Ht 5\' 5"  (1.651 m)   Wt 190 lb 9.6 oz (86.5 kg)   SpO2 98%   BMI 31.72 kg/m , BMI Body mass index is 31.72 kg/m.  Wt Readings from Last 3 Encounters:  03/11/20 190 lb 9.6 oz (86.5 kg)  10/07/19 189 lb (85.7 kg)  04/07/19 189 lb (85.7 kg)    General: Patient appears comfortable at rest. HEENT: Conjunctiva and lids normal, wearing a mask. Neck: Supple, no elevated JVP or carotid bruits, no thyromegaly. Lungs: Clear to auscultation, nonlabored breathing at rest. Cardiac: Regular rate and rhythm, no S3 or significant systolic murmur. Extremities: No pitting edema, distal pulses 2+.  ECG:  An ECG dated 10/07/2019 was personally reviewed today and demonstrated:  Sinus arrhythmia with incomplete right bundle branch block, left anterior fascicular block, old lateral infarct pattern.  Recent Labwork: 05/14/2019: ALT 11; AST 18     Component Value Date/Time   CHOL 120 05/14/2019 0845   CHOL 146 01/19/2015 1048   TRIG 140 05/14/2019 0845   HDL 37 (L) 05/14/2019 0845   HDL 48 01/19/2015 1048   CHOLHDL 3.2 05/14/2019 0845   VLDL 28 05/14/2019 0845   LDLCALC 55 05/14/2019 0845   LDLCALC 77 01/19/2015 1048    Other Studies Reviewed Today:  Cardiac catheterization and PCI 11/16/2016: 1. Critical stenosis of the distal circumflex treated successfully with PCI using a 2.5 mm drug-eluting stent 2. Moderate mid LAD stenosis, does not appear to be flow-obstructive 3. Moderate proximal RCA stenosis (nondominant vessel) 4. Patent stents in the RCA and ramus intermedius 5. Normal LVEDP-will assess LV function by echo because of chronic kidney disease 6. Successful insertion of a temporary transvenous pacing wire for treatment of complete heart block  Echocardiogram 10/08/2018: 1. The left ventricle appears to be normal in size, has severe wall thickness <65% ejection fraction Spectral Doppler shows impaired  relaxation pattern of diastolic filling. 2. Elevated left ventricular end-diastolic pressure. 3. Mitral valve regurgitation is trivial by color flow Doppler. 4. Moderate mitral annular calcification. 5. Aortic valve regurgitation is mild by color flow Doppler. 6. There is mild thickening of the aortic valve. 7. There is sclerosis without any evidence of stenosis of the aortic valve. 8. Moderate annular calcification.  Assessment and Plan:  1.  CAD status post DES to the distal circumflex in March 2018, patent stent sites in the ramus intermedius and RCA documented.  She has done well on medical therapy, no increasing angina symptoms.  Continue aspirin, Norvasc, Cozaar, hydralazine, and Crestor.  Requesting interval lab work from PCP.  2.  Mixed hyperlipidemia, she has done well on Crestor.  3.  CKD stage IIIb, continue to follow-up with nephrology.  Medication Adjustments/Labs and Tests Ordered: Current medicines are reviewed at length with the patient today.  Concerns regarding medicines are outlined above.   Tests Ordered: No orders  of the defined types were placed in this encounter.   Medication Changes: No orders of the defined types were placed in this encounter.   Disposition:  Follow up 6 months in the [place] office.  Signed, Jonelle Sidle, MD, [facility] 03/11/2020 9:42 AM    [facility] at [facility] [address] [place], [place] [address] Phone: [phone]; Fax: [phone]

## 2020-03-16 DIAGNOSIS — H4020X2 Unspecified primary angle-closure glaucoma, moderate stage: Secondary | ICD-10-CM | POA: Diagnosis not present

## 2020-03-21 ENCOUNTER — Other Ambulatory Visit: Payer: Self-pay | Admitting: Cardiology

## 2020-04-13 ENCOUNTER — Other Ambulatory Visit: Payer: Self-pay | Admitting: Cardiology

## 2020-04-13 DIAGNOSIS — Z681 Body mass index (BMI) 19 or less, adult: Secondary | ICD-10-CM | POA: Diagnosis not present

## 2020-04-13 DIAGNOSIS — J0100 Acute maxillary sinusitis, unspecified: Secondary | ICD-10-CM | POA: Diagnosis not present

## 2020-04-28 DIAGNOSIS — M170 Bilateral primary osteoarthritis of knee: Secondary | ICD-10-CM | POA: Diagnosis not present

## 2020-04-28 DIAGNOSIS — M25561 Pain in right knee: Secondary | ICD-10-CM | POA: Diagnosis not present

## 2020-04-28 DIAGNOSIS — M25461 Effusion, right knee: Secondary | ICD-10-CM | POA: Diagnosis not present

## 2020-04-28 DIAGNOSIS — M25562 Pain in left knee: Secondary | ICD-10-CM | POA: Diagnosis not present

## 2020-05-04 DIAGNOSIS — M25511 Pain in right shoulder: Secondary | ICD-10-CM | POA: Diagnosis not present

## 2020-05-04 DIAGNOSIS — M170 Bilateral primary osteoarthritis of knee: Secondary | ICD-10-CM | POA: Diagnosis not present

## 2020-05-04 DIAGNOSIS — M25561 Pain in right knee: Secondary | ICD-10-CM | POA: Diagnosis not present

## 2020-05-04 DIAGNOSIS — M25562 Pain in left knee: Secondary | ICD-10-CM | POA: Diagnosis not present

## 2020-05-04 DIAGNOSIS — M19011 Primary osteoarthritis, right shoulder: Secondary | ICD-10-CM | POA: Diagnosis not present

## 2020-05-10 ENCOUNTER — Ambulatory Visit: Payer: Medicare Other | Admitting: Cardiology

## 2020-05-12 DIAGNOSIS — M25561 Pain in right knee: Secondary | ICD-10-CM | POA: Diagnosis not present

## 2020-05-12 DIAGNOSIS — M25562 Pain in left knee: Secondary | ICD-10-CM | POA: Diagnosis not present

## 2020-05-12 DIAGNOSIS — M170 Bilateral primary osteoarthritis of knee: Secondary | ICD-10-CM | POA: Diagnosis not present

## 2020-05-25 DIAGNOSIS — M25562 Pain in left knee: Secondary | ICD-10-CM | POA: Diagnosis not present

## 2020-05-25 DIAGNOSIS — E6609 Other obesity due to excess calories: Secondary | ICD-10-CM | POA: Diagnosis not present

## 2020-05-25 DIAGNOSIS — M25561 Pain in right knee: Secondary | ICD-10-CM | POA: Diagnosis not present

## 2020-05-25 DIAGNOSIS — M170 Bilateral primary osteoarthritis of knee: Secondary | ICD-10-CM | POA: Diagnosis not present

## 2020-05-25 DIAGNOSIS — N342 Other urethritis: Secondary | ICD-10-CM | POA: Diagnosis not present

## 2020-05-25 DIAGNOSIS — Z6830 Body mass index (BMI) 30.0-30.9, adult: Secondary | ICD-10-CM | POA: Diagnosis not present

## 2020-06-01 DIAGNOSIS — M170 Bilateral primary osteoarthritis of knee: Secondary | ICD-10-CM | POA: Diagnosis not present

## 2020-06-01 DIAGNOSIS — M25562 Pain in left knee: Secondary | ICD-10-CM | POA: Diagnosis not present

## 2020-06-01 DIAGNOSIS — M25561 Pain in right knee: Secondary | ICD-10-CM | POA: Diagnosis not present

## 2020-06-05 ENCOUNTER — Emergency Department (HOSPITAL_COMMUNITY)
Admission: EM | Admit: 2020-06-05 | Discharge: 2020-06-06 | Disposition: A | Discharge status: Home / Self Care | Payer: Medicare Other | Attending: Emergency Medicine | Admitting: Emergency Medicine

## 2020-06-05 ENCOUNTER — Emergency Department (HOSPITAL_COMMUNITY): Payer: Medicare Other

## 2020-06-05 ENCOUNTER — Encounter (HOSPITAL_COMMUNITY): Payer: Self-pay

## 2020-06-05 DIAGNOSIS — Z5321 Procedure and treatment not carried out due to patient leaving prior to being seen by health care provider: Secondary | ICD-10-CM | POA: Diagnosis not present

## 2020-06-05 DIAGNOSIS — R109 Unspecified abdominal pain: Secondary | ICD-10-CM | POA: Insufficient documentation

## 2020-06-05 DIAGNOSIS — R1110 Vomiting, unspecified: Secondary | ICD-10-CM | POA: Diagnosis not present

## 2020-06-05 DIAGNOSIS — K449 Diaphragmatic hernia without obstruction or gangrene: Secondary | ICD-10-CM | POA: Diagnosis not present

## 2020-06-05 DIAGNOSIS — R079 Chest pain, unspecified: Secondary | ICD-10-CM | POA: Insufficient documentation

## 2020-06-05 DIAGNOSIS — R197 Diarrhea, unspecified: Secondary | ICD-10-CM | POA: Insufficient documentation

## 2020-06-05 DIAGNOSIS — J90 Pleural effusion, not elsewhere classified: Secondary | ICD-10-CM | POA: Diagnosis not present

## 2020-06-05 LAB — CBC
HCT: 32.8 % — ABNORMAL LOW (ref 36.0–46.0)
Hemoglobin: 9.9 g/dL — ABNORMAL LOW (ref 12.0–15.0)
MCH: 25 pg — ABNORMAL LOW (ref 26.0–34.0)
MCHC: 30.2 g/dL (ref 30.0–36.0)
MCV: 82.8 fL (ref 80.0–100.0)
Platelets: 316 10*3/uL (ref 150–400)
RBC: 3.96 MIL/uL (ref 3.87–5.11)
RDW: 17.5 % — ABNORMAL HIGH (ref 11.5–15.5)
WBC: 7.1 10*3/uL (ref 4.0–10.5)
nRBC: 0 % (ref 0.0–0.2)

## 2020-06-05 LAB — BASIC METABOLIC PANEL
Anion gap: 8 (ref 5–15)
BUN: 22 mg/dL (ref 8–23)
CO2: 25 mmol/L (ref 22–32)
Calcium: 9.1 mg/dL (ref 8.9–10.3)
Chloride: 107 mmol/L (ref 98–111)
Creatinine, Ser: 1.51 mg/dL — ABNORMAL HIGH (ref 0.44–1.00)
GFR calc Af Amer: 37 mL/min — ABNORMAL LOW (ref >60)
GFR calc non Af Amer: 32 mL/min — ABNORMAL LOW (ref >60)
Glucose, Bld: 120 mg/dL — ABNORMAL HIGH (ref 70–99)
Potassium: 4.8 mmol/L (ref 3.5–5.1)
Sodium: 140 mmol/L (ref 135–145)

## 2020-06-05 LAB — TROPONIN I (HIGH SENSITIVITY): Troponin I (High Sensitivity): 10 ng/L (ref <18)

## 2020-06-05 NOTE — ED Triage Notes (Signed)
Pt reports that she has had abd pain for the past few days, today she has vomiting and diarrhea at the same time and pain radiated to chest area.

## 2020-06-06 DIAGNOSIS — R109 Unspecified abdominal pain: Secondary | ICD-10-CM | POA: Diagnosis not present

## 2020-06-06 DIAGNOSIS — R1110 Vomiting, unspecified: Secondary | ICD-10-CM | POA: Diagnosis not present

## 2020-06-06 DIAGNOSIS — Z6830 Body mass index (BMI) 30.0-30.9, adult: Secondary | ICD-10-CM | POA: Diagnosis not present

## 2020-06-06 DIAGNOSIS — R110 Nausea: Secondary | ICD-10-CM | POA: Diagnosis not present

## 2020-06-06 NOTE — ED Notes (Signed)
Lwbs. 

## 2020-06-07 ENCOUNTER — Encounter: Payer: Self-pay | Admitting: Internal Medicine

## 2020-06-15 DIAGNOSIS — Z6830 Body mass index (BMI) 30.0-30.9, adult: Secondary | ICD-10-CM | POA: Diagnosis not present

## 2020-06-15 DIAGNOSIS — K1370 Unspecified lesions of oral mucosa: Secondary | ICD-10-CM | POA: Diagnosis not present

## 2020-06-15 DIAGNOSIS — E6609 Other obesity due to excess calories: Secondary | ICD-10-CM | POA: Diagnosis not present

## 2020-06-23 DIAGNOSIS — E6609 Other obesity due to excess calories: Secondary | ICD-10-CM | POA: Diagnosis not present

## 2020-06-23 DIAGNOSIS — Z1331 Encounter for screening for depression: Secondary | ICD-10-CM | POA: Diagnosis not present

## 2020-06-23 DIAGNOSIS — Z0001 Encounter for general adult medical examination with abnormal findings: Secondary | ICD-10-CM | POA: Diagnosis not present

## 2020-06-23 DIAGNOSIS — Z6830 Body mass index (BMI) 30.0-30.9, adult: Secondary | ICD-10-CM | POA: Diagnosis not present

## 2020-06-23 DIAGNOSIS — Z1389 Encounter for screening for other disorder: Secondary | ICD-10-CM | POA: Diagnosis not present

## 2020-06-23 DIAGNOSIS — E7849 Other hyperlipidemia: Secondary | ICD-10-CM | POA: Diagnosis not present

## 2020-06-23 DIAGNOSIS — Z0000 Encounter for general adult medical examination without abnormal findings: Secondary | ICD-10-CM | POA: Diagnosis not present

## 2020-06-30 ENCOUNTER — Encounter: Payer: Self-pay | Admitting: Internal Medicine

## 2020-06-30 ENCOUNTER — Ambulatory Visit (INDEPENDENT_AMBULATORY_CARE_PROVIDER_SITE_OTHER): Payer: Medicare Other | Admitting: Internal Medicine

## 2020-06-30 ENCOUNTER — Other Ambulatory Visit: Payer: Self-pay

## 2020-06-30 VITALS — BP 154/91 | HR 81 | Temp 97.7°F | Ht 65.0 in | Wt 184.6 lb

## 2020-06-30 DIAGNOSIS — A048 Other specified bacterial intestinal infections: Secondary | ICD-10-CM | POA: Diagnosis not present

## 2020-06-30 DIAGNOSIS — R1084 Generalized abdominal pain: Secondary | ICD-10-CM | POA: Diagnosis not present

## 2020-06-30 NOTE — Patient Instructions (Addendum)
Stop Protonix starting today.  After 2 weeks of being completely off PPI therapy, drop off stool sample at lab to check for H. pylori eradication.  I will contact you with these results.  Follow-up with GI as needed or if symptoms return.  At [facility] we value your feedback. You may receive a survey about your visit today. Please share your experience as we strive to create trusting relationships with our patients to provide genuine, compassionate, quality care.  We appreciate your understanding and patience as we review any laboratory studies, imaging, and other diagnostic tests that are ordered as we care for you. Our office policy is 5 business days for review of these results, and any emergent or urgent results are addressed in a timely manner for your best interest. If you do not hear from our office in 1 week, please contact us.   We also encourage the use of MyChart, which contains your medical information for your review as well. If you are not enrolled in this feature, an access code is on this after visit summary for your convenience. Thank you for allowing [place] to be involved in your care.  It was great to see you today!  I hope you have a great rest of your fall!!    Kristianne Albin K. Marletta Lor, D.O. Gastroenterology and Hepatology [facility]

## 2020-06-30 NOTE — Progress Notes (Signed)
Primary Care Physician:  Samuella Bruin Primary Gastroenterologist:  Dr. Marletta Lor  Chief Complaint  Patient presents with  . Abdominal Pain    none recently  . h pylori    was treated with antibiotics 3 weeks ago    HPI:   [name] is a 86 y.o. female who presents to the clinic today for evaluation by referral from her PCP Lenise Herald.  Patient states that she had acute onset of epigastric pain approximately 1 to 2 months ago.  States the pain was 9 out of 10, nonradiating.  She presented to the ER at [facility] but left after waiting in the waiting room for nearly 7 hours.  Further work-up by her PCP revealed what sounds like a H. pylori positive IgG.  She reportedly completed a 10-day course of antibiotics and her symptoms have completely resolved.  She is eating well without any issues.  No dysphagia odynophagia does have some chronic reflux for which she takes pantoprazole 40 mg daily.  No history of PUD that she knows of.  Past Medical History:  Diagnosis Date  . Arthritis   . CKD (chronic kidney disease), stage III (HCC)   . Complete heart block (HCC) 11/16/2016   a. transient during NSTEMI, resolved with revascularization.  . Coronary artery disease 11/2009   a. with prior stent placement to the ramus intermedius and RCA. b. NSTEMI 11/2016 s/p DES to DES to distal Cx with moderate residual dz.  . Essential hypertension   . Mixed hyperlipidemia   . Non-ST elevation (NSTEMI) myocardial infarction (HCC) 11/16/2016  . Obesity   . Reflux esophagitis   . Sleep apnea 09/27/2010   Untreated, REM 64.7/hr AHI 18.5/hr RDI 19.2/hr. Patuent refused CPAP therapy    Past Surgical History:  Procedure Laterality Date  . CHOLECYSTECTOMY    . CORONARY ANGIOPLASTY WITH STENT PLACEMENT  2007   A 3.0x68mm CYPHER stent post dilated to 3.29 mm the 100% occlusion was reduced to 0%  . CORONARY STENT INTERVENTION N/A 11/16/2016   Procedure: Coronary Stent Intervention;  Surgeon:  Tonny Bollman, MD;  Location: [facility] INVASIVE CV LAB;  Service: Cardiovascular;  Laterality: N/A;  . LEFT HEART CATH AND CORONARY ANGIOGRAPHY N/A 11/16/2016   Procedure: Left Heart Cath and Coronary Angiography;  Surgeon: Tonny Bollman, MD;  Location: [facility] INVASIVE CV LAB;  Service: Cardiovascular;  Laterality: N/A;  . LEFT HEART CATHETERIZATION WITH CORONARY ANGIOGRAM N/A 07/12/2014   Procedure: LEFT HEART CATHETERIZATION WITH CORONARY ANGIOGRAM;  Surgeon: Micheline Chapman, MD;  Location: [facility] CATH LAB;  Service: Cardiovascular;  Laterality: N/A;  . TEMPORARY PACEMAKER N/A 11/16/2016   Procedure: Temporary Pacemaker;  Surgeon: Tonny Bollman, MD;  Location: [facility] INVASIVE CV LAB;  Service: Cardiovascular;  Laterality: N/A;    Current Outpatient Medications  Medication Sig Dispense Refill  . amLODipine (NORVASC) 5 MG tablet TAKE 1 TABLET BY MOUTH EVERY DAY 90 tablet 3  . aspirin 81 MG tablet Take 81 mg by mouth daily.    . furosemide (LASIX) 40 MG tablet TAKE 1 TABLET BY MOUTH DAILY AS NEEDED FOR EDEMA (Patient taking differently: Take 40 mg by mouth daily as needed for edema. ) 30 tablet 0  . hydrALAZINE (APRESOLINE) 50 MG tablet TAKE 1 TABLET BY MOUTH TWICE A DAY 180 tablet 3  . losartan (COZAAR) 50 MG tablet TAKE 1 TABLET BY MOUTH EVERY DAY 90 tablet 3  . nitroGLYCERIN (NITROSTAT) 0.4 MG SL tablet PLACE 1 TABLET (0.4 MG TOTAL)  UNDER THE TONGUE EVERY 5 (FIVE) MINUTES AS NEEDED. 75 tablet 1  . pantoprazole (PROTONIX) 40 MG tablet TAKE 1 TABLET BY MOUTH EVERY DAY 90 tablet 1  . rosuvastatin (CRESTOR) 40 MG tablet TAKE 1 TABLET BY MOUTH EVERY DAY 90 tablet 3  . Vitamin D, Ergocalciferol, (DRISDOL) 50000 units CAPS capsule Take 50,000 Units by mouth every 30 (thirty) days.  0   No current facility-administered medications for this visit.    Allergies as of 06/30/2020 - Review Complete 06/30/2020  Allergen Reaction Noted  . Atorvastatin Nausea And Vomiting 11/16/2016  . Contrast media [iodinated diagnostic  agents] Nausea And Vomiting 12/01/2011  . Darvon [propoxyphene] Nausea And Vomiting 11/16/2016  . Codeine Nausea And Vomiting and Anxiety 12/01/2011    Family History  Problem Relation Age of Onset  . Hypertension Other     Social History   Socioeconomic History  . Marital status: Single    Spouse name: Not on file  . Number of children: Not on file  . Years of education: Not on file  . Highest education level: Not on file  Occupational History  . Not on file  Tobacco Use  . Smoking status: Never Smoker  . Smokeless tobacco: Never Used  Vaping Use  . Vaping Use: Never used  Substance and Sexual Activity  . Alcohol use: No  . Drug use: No  . Sexual activity: Never  Other Topics Concern  . Not on file  Social History Narrative  . Not on file   Social Determinants of Health   Financial Resource Strain:   . Difficulty of Paying Living Expenses: Not on file  Food Insecurity:   . Worried About Programme researcher, broadcasting/film/video in the Last Year: Not on file  . Ran Out of Food in the Last Year: Not on file  Transportation Needs:   . Lack of Transportation (Medical): Not on file  . Lack of Transportation (Non-Medical): Not on file  Physical Activity:   . Days of Exercise per Week: Not on file  . Minutes of Exercise per Session: Not on file  Stress:   . Feeling of Stress : Not on file  Social Connections:   . Frequency of Communication with Friends and Family: Not on file  . Frequency of Social Gatherings with Friends and Family: Not on file  . Attends Religious Services: Not on file  . Active Member of Clubs or Organizations: Not on file  . Attends Banker Meetings: Not on file  . Marital Status: Not on file  Intimate Partner Violence:   . Fear of Current or Ex-Partner: Not on file  . Emotionally Abused: Not on file  . Physically Abused: Not on file  . Sexually Abused: Not on file    Subjective: Review of Systems  Constitutional: Negative for chills and fever.    HENT: Negative for congestion and hearing loss.   Eyes: Negative for blurred vision and double vision.  Respiratory: Negative for cough and shortness of breath.   Cardiovascular: Negative for chest pain and palpitations.  Gastrointestinal: Negative for abdominal pain, blood in stool, constipation, diarrhea, heartburn, melena and vomiting.  Genitourinary: Negative for dysuria and urgency.  Musculoskeletal: Negative for joint pain and myalgias.  Skin: Negative for itching and rash.  Neurological: Negative for dizziness and headaches.  Psychiatric/Behavioral: Negative for depression. The patient is not nervous/anxious.        Objective: BP (!) 154/91   Pulse 81   Temp 97.7 F (36.5 C)  Ht 5\' 5"  (1.651 m)   Wt 184 lb 9.6 oz (83.7 kg)   BMI 30.72 kg/m  Physical Exam Constitutional:      Appearance: Normal appearance. She is obese.  HENT:     Head: Normocephalic and atraumatic.  Eyes:     Extraocular Movements: Extraocular movements intact.     Conjunctiva/sclera: Conjunctivae normal.  Cardiovascular:     Rate and Rhythm: Normal rate and regular rhythm.  Pulmonary:     Effort: Pulmonary effort is normal.     Breath sounds: Normal breath sounds.  Abdominal:     General: Bowel sounds are normal.     Palpations: Abdomen is soft.  Musculoskeletal:        General: No swelling. Normal range of motion.     Cervical back: Normal range of motion and neck supple.  Skin:    General: Skin is warm and dry.     Coloration: Skin is not jaundiced.  Neurological:     General: No focal deficit present.     Mental Status: She is alert and oriented to person, place, and time.  Psychiatric:        Mood and Affect: Mood normal.        Behavior: Behavior normal.      Assessment: *H. pylori gastritis *Abdominal pain due to above  Plan: Patient appears vastly improved after completing her course of antibiotics for H. pylori gastritis.  I have recommended she stop her PPI completely  for the next 14 days.  We will check a stool antigen to ensure eradication.  I will call patient with these results.  Patient to follow-up with GI as needed or symptoms return.  Thank you for the kind referral.  06/30/2020 4:24 PM   Disclaimer: This note was dictated with voice recognition software. Similar sounding words can inadvertently be transcribed and may not be corrected upon review.

## 2020-07-04 DIAGNOSIS — Z23 Encounter for immunization: Secondary | ICD-10-CM | POA: Diagnosis not present

## 2020-07-12 ENCOUNTER — Telehealth: Payer: Self-pay | Admitting: Internal Medicine

## 2020-07-12 NOTE — Telephone Encounter (Signed)
Tried to return pts call number left for me to use was temporarily disconnected. Will try home number listed for the pt.

## 2020-07-12 NOTE — Telephone Encounter (Signed)
Phoned pts home number LM on VM to return call to our office.

## 2020-07-12 NOTE — Telephone Encounter (Signed)
[phone]  Please call patient, daughter said that she was supposed to stop protonix for 2 weeks prior to having a test done and she can not stop them.  Is there something else she can take for reflux that will not interfere with the test she was supposed to have

## 2020-07-13 ENCOUNTER — Telehealth: Payer: Self-pay | Admitting: Internal Medicine

## 2020-07-13 NOTE — Telephone Encounter (Signed)
Pt returning call. [phone]

## 2020-07-15 NOTE — Telephone Encounter (Signed)
SPOKE WITH PT ADVISED THAT DR. CARVER HAS NOT GOTTEN WITH ME YET ON WHAT HE RECOMMENDS.

## 2020-07-19 ENCOUNTER — Other Ambulatory Visit: Payer: Self-pay

## 2020-07-19 ENCOUNTER — Telehealth: Payer: Self-pay

## 2020-07-19 DIAGNOSIS — R1906 Epigastric swelling, mass or lump: Secondary | ICD-10-CM

## 2020-07-19 DIAGNOSIS — K219 Gastro-esophageal reflux disease without esophagitis: Secondary | ICD-10-CM

## 2020-07-19 MED ORDER — FAMOTIDINE 20 MG PO TABS: 20.0000 mg | ORAL_TABLET | Freq: Two times a day (BID) | ORAL | 0 refills | Status: DC

## 2020-07-19 NOTE — Telephone Encounter (Signed)
Patient needs to hold her Protonix for 2 weeks or else the stool test will not be accurate.  Please send in famotidine 20 mg twice daily for 1 month to her pharmacy that she can take in the meantime.  This is critical to ensure that she has eradicated the bacteria and needs to be done.  She can resume her Protonix daily after stool sample is collected. thank you

## 2020-07-19 NOTE — Telephone Encounter (Signed)
Note [phone]  Please call patient, daughter said that she was supposed to stop protonix for 2 weeks prior to having a test done and she can not stop them.  Is there something else she can take for reflux that will not interfere with the test she was supposed to have

## 2020-07-19 NOTE — Telephone Encounter (Signed)
Called and spoke to the pt and advised how important it is to not take Protonix for 2 weeks because test results will not be accurate. Will call in Rx for the pt to [facility]

## 2020-07-19 NOTE — Telephone Encounter (Signed)
Routed pts request once again to Dr. Marletta Lor to respond to

## 2020-07-22 ENCOUNTER — Other Ambulatory Visit: Payer: Self-pay | Admitting: Cardiovascular Disease

## 2020-08-10 DIAGNOSIS — N1832 Chronic kidney disease, stage 3b: Secondary | ICD-10-CM | POA: Diagnosis not present

## 2020-08-10 DIAGNOSIS — I129 Hypertensive chronic kidney disease with stage 1 through stage 4 chronic kidney disease, or unspecified chronic kidney disease: Secondary | ICD-10-CM | POA: Diagnosis not present

## 2020-08-10 DIAGNOSIS — N2581 Secondary hyperparathyroidism of renal origin: Secondary | ICD-10-CM | POA: Diagnosis not present

## 2020-08-10 DIAGNOSIS — R809 Proteinuria, unspecified: Secondary | ICD-10-CM | POA: Diagnosis not present

## 2020-08-15 ENCOUNTER — Other Ambulatory Visit: Payer: Self-pay | Admitting: Internal Medicine

## 2020-08-15 DIAGNOSIS — K219 Gastro-esophageal reflux disease without esophagitis: Secondary | ICD-10-CM

## 2020-08-15 DIAGNOSIS — R1906 Epigastric swelling, mass or lump: Secondary | ICD-10-CM

## 2020-08-17 NOTE — Telephone Encounter (Signed)
Please ask if patient is still needing this Rx. Looks like it was a temporary Rx while she was supposed to hold Protonix and complete H. Pylori stool test.

## 2020-08-18 NOTE — Telephone Encounter (Signed)
Noted  

## 2020-08-18 NOTE — Telephone Encounter (Signed)
Phoned and spoke to the pt she states she doesn't need a refill and she is still holding off on her Protonix to complete her H.Pylori stool test.

## 2020-08-27 ENCOUNTER — Other Ambulatory Visit: Payer: Self-pay | Admitting: Cardiology

## 2020-09-12 ENCOUNTER — Other Ambulatory Visit: Payer: Self-pay | Admitting: Cardiology

## 2020-09-12 ENCOUNTER — Other Ambulatory Visit: Payer: Self-pay | Admitting: Internal Medicine

## 2020-09-12 DIAGNOSIS — K219 Gastro-esophageal reflux disease without esophagitis: Secondary | ICD-10-CM

## 2020-09-12 DIAGNOSIS — A048 Other specified bacterial intestinal infections: Secondary | ICD-10-CM | POA: Diagnosis not present

## 2020-09-12 DIAGNOSIS — R1906 Epigastric swelling, mass or lump: Secondary | ICD-10-CM

## 2020-09-14 ENCOUNTER — Other Ambulatory Visit: Payer: Self-pay | Admitting: Cardiology

## 2020-09-14 LAB — H. PYLORI ANTIGEN, STOOL: H pylori Ag, Stl: NEGATIVE

## 2020-09-14 NOTE — Telephone Encounter (Signed)
Please verify whether or not patient needs refill of Pepcid.  I suspect she has likely resumed Protonix and stopped Pepcid at this point as she was only holding Protonix in order to complete H. pylori testing.

## 2020-09-15 NOTE — Telephone Encounter (Signed)
Phoned and spoke with the pt and was advised that she didn't need to have the refill on Pepcid. Stated to me her H. Pylori was negative. (pt's words)

## 2020-10-13 DIAGNOSIS — H401132 Primary open-angle glaucoma, bilateral, moderate stage: Secondary | ICD-10-CM | POA: Diagnosis not present

## 2020-10-21 ENCOUNTER — Ambulatory Visit (INDEPENDENT_AMBULATORY_CARE_PROVIDER_SITE_OTHER): Payer: Medicare Other | Admitting: Cardiology

## 2020-10-21 ENCOUNTER — Encounter: Payer: Self-pay | Admitting: Cardiology

## 2020-10-21 VITALS — BP 124/72 | HR 90 | Ht 65.0 in | Wt 181.0 lb

## 2020-10-21 DIAGNOSIS — N1832 Chronic kidney disease, stage 3b: Secondary | ICD-10-CM

## 2020-10-21 DIAGNOSIS — I25119 Atherosclerotic heart disease of native coronary artery with unspecified angina pectoris: Secondary | ICD-10-CM

## 2020-10-21 NOTE — Progress Notes (Signed)
Cardiology Office Note  Date: 10/21/2020   ID: Somer, Trotter Jan 05, 1934, MRN [id number]  PCP:  Shawnie Dapper, PA-C  Cardiologist:  Nona Dell, MD Electrophysiologist:  None   Chief Complaint  Patient presents with  . Cardiac follow-up    History of Present Illness: [name] is an 87 y.o. female last seen in July 2021.  She is here today with her daughter for a follow-up visit.  From a cardiac perspective, she remains stable without active angina or nitroglycerin use.  She has still been very careful during the pandemic, has had her vaccines, wears a mask consistently, tries not to get out in crowds.  She continues to follow with nephrology, last seen in December 2021.  She has CKD stage IIIb, last creatinine 1.36.  This represented an improvement.  I reviewed her medications which are stable from a cardiac perspective and outlined below.  Past Medical History:  Diagnosis Date  . Arthritis   . CKD (chronic kidney disease), stage III (HCC)   . Complete heart block (HCC) 11/16/2016   a. transient during NSTEMI, resolved with revascularization.  . Coronary artery disease 11/2009   a. with prior stent placement to the ramus intermedius and RCA. b. NSTEMI 11/2016 s/p DES to DES to distal Cx with moderate residual dz.  . Essential hypertension   . Mixed hyperlipidemia   . Non-ST elevation (NSTEMI) myocardial infarction (HCC) 11/16/2016  . Obesity   . Reflux esophagitis   . Sleep apnea 09/27/2010   Untreated, REM 64.7/hr AHI 18.5/hr RDI 19.2/hr. Patuent refused CPAP therapy    Past Surgical History:  Procedure Laterality Date  . CHOLECYSTECTOMY    . CORONARY ANGIOPLASTY WITH STENT PLACEMENT  2007   A 3.0x61mm CYPHER stent post dilated to 3.29 mm the 100% occlusion was reduced to 0%  . CORONARY STENT INTERVENTION N/A 11/16/2016   Procedure: Coronary Stent Intervention;  Surgeon: Tonny Bollman, MD;  Location: [facility] INVASIVE CV LAB;  Service: Cardiovascular;   Laterality: N/A;  . LEFT HEART CATH AND CORONARY ANGIOGRAPHY N/A 11/16/2016   Procedure: Left Heart Cath and Coronary Angiography;  Surgeon: Tonny Bollman, MD;  Location: [facility] INVASIVE CV LAB;  Service: Cardiovascular;  Laterality: N/A;  . LEFT HEART CATHETERIZATION WITH CORONARY ANGIOGRAM N/A 07/12/2014   Procedure: LEFT HEART CATHETERIZATION WITH CORONARY ANGIOGRAM;  Surgeon: Micheline Chapman, MD;  Location: [facility] CATH LAB;  Service: Cardiovascular;  Laterality: N/A;  . TEMPORARY PACEMAKER N/A 11/16/2016   Procedure: Temporary Pacemaker;  Surgeon: Tonny Bollman, MD;  Location: [facility] INVASIVE CV LAB;  Service: Cardiovascular;  Laterality: N/A;    Current Outpatient Medications  Medication Sig Dispense Refill  . amLODipine (NORVASC) 5 MG tablet TAKE 1 TABLET BY MOUTH EVERY DAY 90 tablet 3  . aspirin 81 MG tablet Take 81 mg by mouth daily.    . furosemide (LASIX) 40 MG tablet TAKE 1 TABLET BY MOUTH DAILY AS NEEDED FOR EDEMA (Patient taking differently: Take 40 mg by mouth daily as needed for edema.) 30 tablet 0  . hydrALAZINE (APRESOLINE) 50 MG tablet TAKE 1 TABLET BY MOUTH TWICE A DAY 180 tablet 3  . losartan (COZAAR) 50 MG tablet TAKE 1 TABLET BY MOUTH EVERY DAY 90 tablet 1  . nitroGLYCERIN (NITROSTAT) 0.4 MG SL tablet PLACE 1 TABLET UNDER THE TONGUE EVERY 5 MINUTES AS NEEDED. 30 tablet 2  . pantoprazole (PROTONIX) 40 MG tablet TAKE 1 TABLET BY MOUTH EVERY DAY 90 tablet 1  . rosuvastatin (CRESTOR)  40 MG tablet TAKE 1 TABLET BY MOUTH EVERY DAY 90 tablet 3  . Vitamin D, Ergocalciferol, (DRISDOL) 50000 units CAPS capsule Take 50,000 Units by mouth every 30 (thirty) days.  0   No current facility-administered medications for this visit.   Allergies:  Atorvastatin, Contrast media [iodinated diagnostic agents], Darvon [propoxyphene], and Codeine   ROS: No palpitations or syncope.  Physical Exam: VS:  BP 124/72   Pulse 90   Ht 5\' 5"  (1.651 m)   Wt 181 lb (82.1 kg)   SpO2 98%   BMI 30.12 kg/m , BMI  Body mass index is 30.12 kg/m.  Wt Readings from Last 3 Encounters:  10/21/20 181 lb (82.1 kg)  06/30/20 184 lb 9.6 oz (83.7 kg)  06/05/20 180 lb (81.6 kg)    General: Patient appears comfortable at rest. HEENT: Conjunctiva and lids normal, wearing a mask. Neck: Supple, no elevated JVP or carotid bruits, no thyromegaly. Lungs: Clear to auscultation, nonlabored breathing at rest. Cardiac: Regular rate and rhythm, no S3 or significant systolic murmur, no pericardial rub. Extremities: No pitting edema.  ECG:  An ECG dated 06/05/2020 was personally reviewed today and demonstrated:  Sinus arrhythmia with fusion beat, right bundle branch block, left anterior fascicular block.  Recent Labwork: 06/05/2020: BUN 22; Creatinine, Ser 1.51; Hemoglobin 9.9; Platelets 316; Potassium 4.8; Sodium 140     Component Value Date/Time   CHOL 120 05/14/2019 0845   CHOL 146 01/19/2015 1048   TRIG 140 05/14/2019 0845   HDL 37 (L) 05/14/2019 0845   HDL 48 01/19/2015 1048   CHOLHDL 3.2 05/14/2019 0845   VLDL 28 05/14/2019 0845   LDLCALC 55 05/14/2019 0845   LDLCALC 77 01/19/2015 1048    Other Studies Reviewed Today:  Echocardiogram 10/08/2018: 1. The left ventricle appears to be normal in size, has severe wall  thickness <65% ejection fraction Spectral Doppler shows impaired  relaxation pattern of diastolic filling.  2. Elevated left ventricular end-diastolic pressure.  3. Mitral valve regurgitation is trivial by color flow Doppler.  4. Moderate mitral annular calcification.  5. Aortic valve regurgitation is mild by color flow Doppler.  6. There is mild thickening of the aortic valve.  7. There is sclerosis without any evidence of stenosis of the aortic  valve.  8. Moderate annular calcification.   Assessment and Plan:  1.  CAD status post DES to distal circumflex in March 2018, patent stent sites in the ramus intermedius and RCA documented at that time.  She continues to do well without  active angina on medical therapy.  Aspirin, Norvasc, losartan, hydralazine, and Crestor.  2.  CKD stage IIIb, she continues to follow with nephrology with last creatinine down to 1.36.  Medication Adjustments/Labs and Tests Ordered: Current medicines are reviewed at length with the patient today.  Concerns regarding medicines are outlined above.   Tests Ordered: No orders of the defined types were placed in this encounter.   Medication Changes: No orders of the defined types were placed in this encounter.   Disposition:  Follow up 6 months in the [place] office.  Signed, Garrison, MD, [facility] 10/21/2020 3:40 PM    [facility] [address] [place], [place], [place] Phone: [phone]; Fax: [phone]

## 2020-10-21 NOTE — Patient Instructions (Signed)

## 2020-11-16 DIAGNOSIS — M25561 Pain in right knee: Secondary | ICD-10-CM | POA: Diagnosis not present

## 2020-11-16 DIAGNOSIS — M25461 Effusion, right knee: Secondary | ICD-10-CM | POA: Diagnosis not present

## 2020-11-16 DIAGNOSIS — M25462 Effusion, left knee: Secondary | ICD-10-CM | POA: Diagnosis not present

## 2020-11-16 DIAGNOSIS — M25562 Pain in left knee: Secondary | ICD-10-CM | POA: Diagnosis not present

## 2020-11-16 DIAGNOSIS — M170 Bilateral primary osteoarthritis of knee: Secondary | ICD-10-CM | POA: Diagnosis not present

## 2020-11-23 DIAGNOSIS — M170 Bilateral primary osteoarthritis of knee: Secondary | ICD-10-CM | POA: Diagnosis not present

## 2020-11-23 DIAGNOSIS — M25562 Pain in left knee: Secondary | ICD-10-CM | POA: Diagnosis not present

## 2020-11-23 DIAGNOSIS — M25561 Pain in right knee: Secondary | ICD-10-CM | POA: Diagnosis not present

## 2020-12-14 ENCOUNTER — Other Ambulatory Visit: Payer: Self-pay | Admitting: Cardiology

## 2021-01-24 ENCOUNTER — Other Ambulatory Visit: Payer: Self-pay | Admitting: Cardiovascular Disease

## 2021-01-28 ENCOUNTER — Encounter: Payer: Self-pay | Admitting: Emergency Medicine

## 2021-01-28 ENCOUNTER — Ambulatory Visit
Admission: EM | Admit: 2021-01-28 | Discharge: 2021-01-28 | Disposition: A | Discharge status: Home / Self Care | Payer: Medicare Other | Attending: Emergency Medicine | Admitting: Emergency Medicine

## 2021-01-28 ENCOUNTER — Other Ambulatory Visit: Payer: Self-pay

## 2021-01-28 DIAGNOSIS — J069 Acute upper respiratory infection, unspecified: Secondary | ICD-10-CM | POA: Diagnosis not present

## 2021-01-28 DIAGNOSIS — R059 Cough, unspecified: Secondary | ICD-10-CM

## 2021-01-28 DIAGNOSIS — R0981 Nasal congestion: Secondary | ICD-10-CM | POA: Diagnosis not present

## 2021-01-28 MED ORDER — FLUTICASONE PROPIONATE 50 MCG/ACT NA SUSP: 2.0000 | Freq: Every day | NASAL | 0 refills | Status: DC

## 2021-01-28 MED ORDER — CETIRIZINE HCL 5 MG PO TABS: 5.0000 mg | ORAL_TABLET | Freq: Every day | ORAL | 0 refills | Status: DC

## 2021-01-28 MED ORDER — BENZONATATE 100 MG PO CAPS: 100.0000 mg | ORAL_CAPSULE | Freq: Three times a day (TID) | ORAL | 0 refills | Status: DC

## 2021-01-28 NOTE — Discharge Instructions (Signed)
COVID testing ordered.  It will take between 5-7 days for test results.  Someone will contact you regarding abnormal results.    In the meantime: You should remain isolated in your home for 10 days from symptom onset AND greater than 72 hours after symptoms resolution (absence of fever without the use of fever-reducing medication and improvement in respiratory symptoms), whichever is longer Get plenty of rest and push fluids Tessalon Perles prescribed for cough Zyrtec for nasal congestion, runny nose, and/or sore throat Flonase for nasal congestion and runny nose Use medications daily for symptom relief Use OTC medications like ibuprofen or tylenol as needed fever or pain Call or go to the ED if you have any new or worsening symptoms such as fever, worsening cough, shortness of breath, chest tightness, chest pain, turning blue, changes in mental status, etc..Marland Kitchen

## 2021-01-28 NOTE — ED Triage Notes (Signed)
Sinus drainage/allergies since Thursday.  Cough with yellow mucous.  No Fevers.  Has taken allegra.

## 2021-01-28 NOTE — ED Provider Notes (Signed)
[facility] CARE CENTER   [id number] 01/28/21 Arrival Time: 0801   CC: COVID symptoms  SUBJECTIVE: History from: patient.  [name] is a 87 y.o. female who presents with sinus drainage, congestion, and cough x 2 days.  Daughter with similar symptoms.  Has tried allegra without relief.  Symptoms are made worse at night.  Reports previous symptoms in the past.   Denies fever, SOB, wheezing, chest pain, nausea, changes in bowel or bladder habits.   ROS: As per HPI.  All other pertinent ROS negative.     Past Medical History:  Diagnosis Date  . Arthritis   . CKD (chronic kidney disease), stage III (HCC)   . Complete heart block (HCC) 11/16/2016   a. transient during NSTEMI, resolved with revascularization.  . Coronary artery disease 11/2009   a. with prior stent placement to the ramus intermedius and RCA. b. NSTEMI 11/2016 s/p DES to DES to distal Cx with moderate residual dz.  . Essential hypertension   . Mixed hyperlipidemia   . Non-ST elevation (NSTEMI) myocardial infarction (HCC) 11/16/2016  . Obesity   . Reflux esophagitis   . Sleep apnea 09/27/2010   Untreated, REM 64.7/hr AHI 18.5/hr RDI 19.2/hr. Patuent refused CPAP therapy   Past Surgical History:  Procedure Laterality Date  . CHOLECYSTECTOMY    . CORONARY ANGIOPLASTY WITH STENT PLACEMENT  2007   A 3.0x80mm CYPHER stent post dilated to 3.29 mm the 100% occlusion was reduced to 0%  . CORONARY STENT INTERVENTION N/A 11/16/2016   Procedure: Coronary Stent Intervention;  Surgeon: Tonny Bollman, MD;  Location: [facility] INVASIVE CV LAB;  Service: Cardiovascular;  Laterality: N/A;  . LEFT HEART CATH AND CORONARY ANGIOGRAPHY N/A 11/16/2016   Procedure: Left Heart Cath and Coronary Angiography;  Surgeon: Tonny Bollman, MD;  Location: [facility] INVASIVE CV LAB;  Service: Cardiovascular;  Laterality: N/A;  . LEFT HEART CATHETERIZATION WITH CORONARY ANGIOGRAM N/A 07/12/2014   Procedure: LEFT HEART CATHETERIZATION WITH CORONARY ANGIOGRAM;   Surgeon: Micheline Chapman, MD;  Location: [facility] CATH LAB;  Service: Cardiovascular;  Laterality: N/A;  . TEMPORARY PACEMAKER N/A 11/16/2016   Procedure: Temporary Pacemaker;  Surgeon: Tonny Bollman, MD;  Location: [facility] INVASIVE CV LAB;  Service: Cardiovascular;  Laterality: N/A;   Allergies  Allergen Reactions  . Atorvastatin Nausea And Vomiting  . Contrast Media [Iodinated Diagnostic Agents] Nausea And Vomiting  . Darvon [Propoxyphene] Nausea And Vomiting  . Codeine Nausea And Vomiting and Anxiety   No current facility-administered medications on file prior to encounter.   Current Outpatient Medications on File Prior to Encounter  Medication Sig Dispense Refill  . amLODipine (NORVASC) 5 MG tablet TAKE 1 TABLET BY MOUTH EVERY DAY 90 tablet 3  . aspirin 81 MG tablet Take 81 mg by mouth daily.    . furosemide (LASIX) 40 MG tablet TAKE 1 TABLET BY MOUTH DAILY AS NEEDED FOR EDEMA (Patient taking differently: Take 40 mg by mouth daily as needed for edema.) 30 tablet 0  . hydrALAZINE (APRESOLINE) 50 MG tablet TAKE 1 TABLET BY MOUTH TWICE A DAY 180 tablet 3  . losartan (COZAAR) 50 MG tablet TAKE 1 TABLET BY MOUTH EVERY DAY 90 tablet 3  . nitroGLYCERIN (NITROSTAT) 0.4 MG SL tablet PLACE 1 TABLET UNDER THE TONGUE EVERY 5 MINUTES AS NEEDED. 30 tablet 2  . pantoprazole (PROTONIX) 40 MG tablet TAKE 1 TABLET BY MOUTH EVERY DAY 90 tablet 1  . rosuvastatin (CRESTOR) 40 MG tablet TAKE 1 TABLET BY MOUTH EVERY DAY 90 tablet  3  . Vitamin D, Ergocalciferol, (DRISDOL) 50000 units CAPS capsule Take 50,000 Units by mouth every 30 (thirty) days.  0   Social History   Socioeconomic History  . Marital status: Single    Spouse name: Not on file  . Number of children: Not on file  . Years of education: Not on file  . Highest education level: Not on file  Occupational History  . Not on file  Tobacco Use  . Smoking status: Never Smoker  . Smokeless tobacco: Never Used  Vaping Use  . Vaping Use: Never used   Substance and Sexual Activity  . Alcohol use: No  . Drug use: No  . Sexual activity: Never  Other Topics Concern  . Not on file  Social History Narrative  . Not on file   Social Determinants of Health   Financial Resource Strain: Not on file  Food Insecurity: Not on file  Transportation Needs: Not on file  Physical Activity: Not on file  Stress: Not on file  Social Connections: Not on file  Intimate Partner Violence: Not on file   Family History  Problem Relation Age of Onset  . Hypertension Other     OBJECTIVE:  Vitals:   01/28/21 0814 01/28/21 0815  BP: (!) 169/93   Pulse: 75   Resp: 16   Temp: 98.6 F (37 C)   TempSrc: Oral   SpO2: 95%   Weight:  181 lb (82.1 kg)    General appearance: alert; mildly fatigued appearing, nontoxic; speaking in full sentences and tolerating own secretions HEENT: NCAT; Ears: EACs clear, TMs Ilsa gray; Eyes: PERRL.  EOM grossly intact. Nose: nares patent without rhinorrhea, turbinates with swelling and erythema, Throat: oropharynx clear, tonsils non erythematous or enlarged, uvula midline  Neck: supple without LAD Lungs: unlabored respirations, symmetrical air entry; cough: absent; no respiratory distress; CTAB Heart: regular rate and rhythm.  Skin: warm and dry Psychological: alert and cooperative; normal mood and affect   ASSESSMENT & PLAN:  1. Sinus congestion   2. Cough   3. Viral URI with cough     Meds ordered this encounter  Medications  . cetirizine (ZYRTEC) 5 MG tablet    Sig: Take 1 tablet (5 mg total) by mouth daily.    Dispense:  20 tablet    Refill:  0    Order Specific Question:   Supervising Provider    Answer:   Eustace Moore [[id number]]  . fluticasone (FLONASE) 50 MCG/ACT nasal spray    Sig: Place 2 sprays into both nostrils daily.    Dispense:  16 g    Refill:  0    Order Specific Question:   Supervising Provider    Answer:   Eustace Moore [[id number]]  . benzonatate (TESSALON) 100 MG capsule     Sig: Take 1 capsule (100 mg total) by mouth every 8 (eight) hours.    Dispense:  21 capsule    Refill:  0    Order Specific Question:   Supervising Provider    Answer:   Eustace Moore [[id number]]   COVID testing ordered.  It will take between 5-7 days for test results.  Someone will contact you regarding abnormal results.    In the meantime: You should remain isolated in your home for 10 days from symptom onset AND greater than 72 hours after symptoms resolution (absence of fever without the use of fever-reducing medication and improvement in respiratory symptoms), whichever is longer Get plenty of rest  and push fluids Tessalon Perles prescribed for cough Zyrtec for nasal congestion, runny nose, and/or sore throat Flonase for nasal congestion and runny nose Use medications daily for symptom relief Use OTC medications like ibuprofen or tylenol as needed fever or pain Call or go to the ED if you have any new or worsening symptoms such as fever, worsening cough, shortness of breath, chest tightness, chest pain, turning blue, changes in mental status, etc...   Reviewed expectations re: course of current medical issues. Questions answered. Outlined signs and symptoms indicating need for more acute intervention. Patient verbalized understanding. After Visit Summary given.         Rennis Harding, PA-C 01/28/21 [phone]

## 2021-01-29 LAB — COVID-19, FLU A+B NAA
Influenza A, NAA: NOT DETECTED
Influenza B, NAA: NOT DETECTED
SARS-CoV-2, NAA: NOT DETECTED

## 2021-03-01 DIAGNOSIS — M25562 Pain in left knee: Secondary | ICD-10-CM | POA: Diagnosis not present

## 2021-03-01 DIAGNOSIS — M25561 Pain in right knee: Secondary | ICD-10-CM | POA: Diagnosis not present

## 2021-03-01 DIAGNOSIS — M170 Bilateral primary osteoarthritis of knee: Secondary | ICD-10-CM | POA: Diagnosis not present

## 2021-03-08 DIAGNOSIS — M25561 Pain in right knee: Secondary | ICD-10-CM | POA: Diagnosis not present

## 2021-03-08 DIAGNOSIS — M170 Bilateral primary osteoarthritis of knee: Secondary | ICD-10-CM | POA: Diagnosis not present

## 2021-03-08 DIAGNOSIS — M25562 Pain in left knee: Secondary | ICD-10-CM | POA: Diagnosis not present

## 2021-03-16 DIAGNOSIS — M25562 Pain in left knee: Secondary | ICD-10-CM | POA: Diagnosis not present

## 2021-03-16 DIAGNOSIS — M170 Bilateral primary osteoarthritis of knee: Secondary | ICD-10-CM | POA: Diagnosis not present

## 2021-03-16 DIAGNOSIS — M25561 Pain in right knee: Secondary | ICD-10-CM | POA: Diagnosis not present

## 2021-04-05 DIAGNOSIS — M19011 Primary osteoarthritis, right shoulder: Secondary | ICD-10-CM | POA: Diagnosis not present

## 2021-04-05 DIAGNOSIS — M25511 Pain in right shoulder: Secondary | ICD-10-CM | POA: Diagnosis not present

## 2021-04-13 DIAGNOSIS — M19011 Primary osteoarthritis, right shoulder: Secondary | ICD-10-CM | POA: Diagnosis not present

## 2021-04-13 DIAGNOSIS — M5412 Radiculopathy, cervical region: Secondary | ICD-10-CM | POA: Diagnosis not present

## 2021-04-13 DIAGNOSIS — Z1331 Encounter for screening for depression: Secondary | ICD-10-CM | POA: Diagnosis not present

## 2021-04-13 DIAGNOSIS — H401130 Primary open-angle glaucoma, bilateral, stage unspecified: Secondary | ICD-10-CM | POA: Diagnosis not present

## 2021-04-13 DIAGNOSIS — H401131 Primary open-angle glaucoma, bilateral, mild stage: Secondary | ICD-10-CM | POA: Diagnosis not present

## 2021-04-13 DIAGNOSIS — E6609 Other obesity due to excess calories: Secondary | ICD-10-CM | POA: Diagnosis not present

## 2021-04-13 DIAGNOSIS — Z6830 Body mass index (BMI) 30.0-30.9, adult: Secondary | ICD-10-CM | POA: Diagnosis not present

## 2021-04-28 ENCOUNTER — Encounter: Payer: Self-pay | Admitting: Cardiology

## 2021-04-28 ENCOUNTER — Other Ambulatory Visit: Payer: Self-pay

## 2021-04-28 ENCOUNTER — Ambulatory Visit (INDEPENDENT_AMBULATORY_CARE_PROVIDER_SITE_OTHER): Payer: Medicare Other | Admitting: Cardiology

## 2021-04-28 VITALS — BP 148/78 | HR 90 | Ht 65.0 in | Wt 179.0 lb

## 2021-04-28 DIAGNOSIS — I25119 Atherosclerotic heart disease of native coronary artery with unspecified angina pectoris: Secondary | ICD-10-CM

## 2021-04-28 DIAGNOSIS — I10 Essential (primary) hypertension: Secondary | ICD-10-CM | POA: Diagnosis not present

## 2021-04-28 DIAGNOSIS — N1832 Chronic kidney disease, stage 3b: Secondary | ICD-10-CM | POA: Diagnosis not present

## 2021-04-28 NOTE — Progress Notes (Signed)
Cardiology Office Note  Date: 04/28/2021   ID: [name], DOB 01/27/1934, MRN [id number]  PCP:  Shawnie Dapper, PA-C  Cardiologist:  Nona Dell, MD Electrophysiologist:  None   Chief Complaint  Patient presents with   Cardiac follow-up    History of Present Illness: [name] is an 87 y.o. female last seen in February.  She is here with her daughter for a follow-up visit.  Reports no active angina at this time or nitroglycerin use.  She is limited by arthritic pain and stiffness mainly involving her knees and shoulders.  She just recently had a steroid injection in her right shoulder by PCP.  I reviewed her cardiac regimen which is stable and outlined below.  We have continued observation without follow-up ischemic testing in the absence of any active angina, last PCI was in 2018.  She continues to follow with [facility] for routine lab work.  Echocardiogram in 2020 revealed LVEF 65%.  Past Medical History:  Diagnosis Date   Arthritis    CKD (chronic kidney disease), stage III (HCC)    Complete heart block (HCC) 11/16/2016   a. transient during NSTEMI, resolved with revascularization.   Coronary artery disease 11/2009   a. with prior stent placement to the ramus intermedius and RCA. b. NSTEMI 11/2016 s/p DES to DES to distal Cx with moderate residual dz.   Essential hypertension    Mixed hyperlipidemia    Non-ST elevation (NSTEMI) myocardial infarction (HCC) 11/16/2016   Obesity    Reflux esophagitis    Sleep apnea 09/27/2010   Untreated, REM 64.7/hr AHI 18.5/hr RDI 19.2/hr. Patuent refused CPAP therapy    Past Surgical History:  Procedure Laterality Date   CHOLECYSTECTOMY     CORONARY ANGIOPLASTY WITH STENT PLACEMENT  2007   A 3.0x33mm CYPHER stent post dilated to 3.29 mm the 100% occlusion was reduced to 0%   CORONARY STENT INTERVENTION N/A 11/16/2016   Procedure: Coronary Stent Intervention;  Surgeon: Tonny Bollman, MD;  Location: [facility] INVASIVE CV LAB;   Service: Cardiovascular;  Laterality: N/A;   LEFT HEART CATH AND CORONARY ANGIOGRAPHY N/A 11/16/2016   Procedure: Left Heart Cath and Coronary Angiography;  Surgeon: Tonny Bollman, MD;  Location: [facility] INVASIVE CV LAB;  Service: Cardiovascular;  Laterality: N/A;   LEFT HEART CATHETERIZATION WITH CORONARY ANGIOGRAM N/A 07/12/2014   Procedure: LEFT HEART CATHETERIZATION WITH CORONARY ANGIOGRAM;  Surgeon: Micheline Chapman, MD;  Location: [facility] CATH LAB;  Service: Cardiovascular;  Laterality: N/A;   TEMPORARY PACEMAKER N/A 11/16/2016   Procedure: Temporary Pacemaker;  Surgeon: Tonny Bollman, MD;  Location: [facility] INVASIVE CV LAB;  Service: Cardiovascular;  Laterality: N/A;    Current Outpatient Medications  Medication Sig Dispense Refill   amLODipine (NORVASC) 5 MG tablet TAKE 1 TABLET BY MOUTH EVERY DAY 90 tablet 3   aspirin 81 MG tablet Take 81 mg by mouth daily.     benzonatate (TESSALON) 100 MG capsule Take 1 capsule (100 mg total) by mouth every 8 (eight) hours. 21 capsule 0   cetirizine (ZYRTEC) 5 MG tablet Take 1 tablet (5 mg total) by mouth daily. 20 tablet 0   furosemide (LASIX) 40 MG tablet TAKE 1 TABLET BY MOUTH DAILY AS NEEDED FOR EDEMA (Patient taking differently: Take 40 mg by mouth daily as needed for edema.) 30 tablet 0   hydrALAZINE (APRESOLINE) 50 MG tablet TAKE 1 TABLET BY MOUTH TWICE A DAY 180 tablet 3   losartan (COZAAR) 50 MG tablet TAKE 1 TABLET  BY MOUTH EVERY DAY 90 tablet 3   nitroGLYCERIN (NITROSTAT) 0.4 MG SL tablet PLACE 1 TABLET UNDER THE TONGUE EVERY 5 MINUTES AS NEEDED. 30 tablet 2   pantoprazole (PROTONIX) 40 MG tablet TAKE 1 TABLET BY MOUTH EVERY DAY 90 tablet 1   rosuvastatin (CRESTOR) 40 MG tablet TAKE 1 TABLET BY MOUTH EVERY DAY 90 tablet 3   fluticasone (FLONASE) 50 MCG/ACT nasal spray Place 2 sprays into both nostrils daily. (Patient not taking: Reported on 04/28/2021) 16 g 0   Vitamin D, Ergocalciferol, (DRISDOL) 50000 units CAPS capsule Take 50,000 Units by mouth every 30  (thirty) days. (Patient not taking: Reported on 04/28/2021)  0   No current facility-administered medications for this visit.   Allergies:  Atorvastatin, Contrast media [iodinated diagnostic agents], Darvon [propoxyphene], and Codeine   ROS: No orthopnea or PND.  Physical Exam: VS:  BP (!) 148/78   Pulse 90   Ht 5\' 5"  (1.651 m)   Wt 179 lb (81.2 kg)   SpO2 99%   BMI 29.79 kg/m , BMI Body mass index is 29.79 kg/m.  Wt Readings from Last 3 Encounters:  04/28/21 179 lb (81.2 kg)  01/28/21 181 lb (82.1 kg)  10/21/20 181 lb (82.1 kg)    General: Patient appears comfortable at rest. HEENT: Conjunctiva and lids normal, wearing a mask. Neck: Supple, no elevated JVP or carotid bruits, no thyromegaly. Lungs: Clear to auscultation, nonlabored breathing at rest. Cardiac: Regular rate and rhythm, no S3 or significant systolic murmur.  ECG:  An ECG dated 06/05/2020 was personally reviewed today and demonstrated:  Sinus rhythm with PAC, right bundle branch block and left anterior fascicular block.  Recent Labwork: 06/05/2020: BUN 22; Creatinine, Ser 1.51; Hemoglobin 9.9; Platelets 316; Potassium 4.8; Sodium 140     Component Value Date/Time   CHOL 120 05/14/2019 0845   CHOL 146 01/19/2015 1048   TRIG 140 05/14/2019 0845   HDL 37 (L) 05/14/2019 0845   HDL 48 01/19/2015 1048   CHOLHDL 3.2 05/14/2019 0845   VLDL 28 05/14/2019 0845   LDLCALC 55 05/14/2019 0845   LDLCALC 77 01/19/2015 1048  December 2021: BUN 13, creatinine 1.22, potassium 4.6  Other Studies Reviewed Today:  Echocardiogram 10/08/2018:  1. The left ventricle appears to be normal in size, has severe wall  thickness <65% ejection fraction Spectral Doppler shows impaired  relaxation pattern of diastolic filling.   2. Elevated left ventricular end-diastolic pressure.   3. Mitral valve regurgitation is trivial by color flow Doppler.   4. Moderate mitral annular calcification.   5. Aortic valve regurgitation is mild by color  flow Doppler.   6. There is mild thickening of the aortic valve.   7. There is sclerosis without any evidence of stenosis of the aortic  valve.   8. Moderate annular calcification.   Assessment and Plan:  1.  CAD status post DES to the distal circumflex in March 2018 with patent stent sites in the ramus intermedius and RCA noted at that point.  We are following her expectantly on medical therapy in the absence of angina symptoms.  No recent nitroglycerin use.  Continue aspirin, Norvasc, Cozaar, hydralazine, and Crestor.  2.  Essential hypertension, on multimodal therapy, no changes made to present regimen.  3.  CKD stage IIIb, last creatinine 1.22.  Continues to follow with nephrology.  Medication Adjustments/Labs and Tests Ordered: Current medicines are reviewed at length with the patient today.  Concerns regarding medicines are outlined above.   Tests  Ordered: No orders of the defined types were placed in this encounter.   Medication Changes: No orders of the defined types were placed in this encounter.   Disposition:  Follow up  6 months.  Signed, Jonelle Sidle, MD, [facility] 04/28/2021 2:27 PM    [facility] at [facility] [address] [place], [place] [address] Phone: [phone]; Fax: [phone]

## 2021-04-28 NOTE — Patient Instructions (Addendum)

## 2021-05-27 ENCOUNTER — Other Ambulatory Visit: Payer: Self-pay | Admitting: Cardiology

## 2021-06-13 ENCOUNTER — Other Ambulatory Visit: Payer: Self-pay | Admitting: Cardiology

## 2021-06-14 DIAGNOSIS — Z79899 Other long term (current) drug therapy: Secondary | ICD-10-CM | POA: Diagnosis not present

## 2021-06-14 DIAGNOSIS — D519 Vitamin B12 deficiency anemia, unspecified: Secondary | ICD-10-CM | POA: Diagnosis not present

## 2021-06-14 DIAGNOSIS — R809 Proteinuria, unspecified: Secondary | ICD-10-CM | POA: Diagnosis not present

## 2021-06-14 DIAGNOSIS — N2581 Secondary hyperparathyroidism of renal origin: Secondary | ICD-10-CM | POA: Diagnosis not present

## 2021-06-14 DIAGNOSIS — N1832 Chronic kidney disease, stage 3b: Secondary | ICD-10-CM | POA: Diagnosis not present

## 2021-06-14 DIAGNOSIS — I129 Hypertensive chronic kidney disease with stage 1 through stage 4 chronic kidney disease, or unspecified chronic kidney disease: Secondary | ICD-10-CM | POA: Diagnosis not present

## 2021-06-21 DIAGNOSIS — Z6831 Body mass index (BMI) 31.0-31.9, adult: Secondary | ICD-10-CM | POA: Diagnosis not present

## 2021-06-21 DIAGNOSIS — E6609 Other obesity due to excess calories: Secondary | ICD-10-CM | POA: Diagnosis not present

## 2021-06-21 DIAGNOSIS — K529 Noninfective gastroenteritis and colitis, unspecified: Secondary | ICD-10-CM | POA: Diagnosis not present

## 2021-06-23 DIAGNOSIS — R809 Proteinuria, unspecified: Secondary | ICD-10-CM | POA: Diagnosis not present

## 2021-06-23 DIAGNOSIS — N2581 Secondary hyperparathyroidism of renal origin: Secondary | ICD-10-CM | POA: Diagnosis not present

## 2021-06-23 DIAGNOSIS — D638 Anemia in other chronic diseases classified elsewhere: Secondary | ICD-10-CM | POA: Diagnosis not present

## 2021-06-23 DIAGNOSIS — R93421 Abnormal radiologic findings on diagnostic imaging of right kidney: Secondary | ICD-10-CM | POA: Diagnosis not present

## 2021-06-23 DIAGNOSIS — I5032 Chronic diastolic (congestive) heart failure: Secondary | ICD-10-CM | POA: Diagnosis not present

## 2021-06-23 DIAGNOSIS — D508 Other iron deficiency anemias: Secondary | ICD-10-CM | POA: Diagnosis not present

## 2021-06-23 DIAGNOSIS — N1831 Chronic kidney disease, stage 3a: Secondary | ICD-10-CM | POA: Diagnosis not present

## 2021-06-26 ENCOUNTER — Encounter: Payer: Self-pay | Admitting: Internal Medicine

## 2021-07-04 ENCOUNTER — Other Ambulatory Visit: Payer: Self-pay

## 2021-07-04 ENCOUNTER — Encounter (HOSPITAL_COMMUNITY)
Admission: RE | Admit: 2021-07-04 | Discharge: 2021-07-04 | Disposition: A | Discharge status: Home / Self Care | Payer: Medicare Other | Source: Ambulatory Visit | Attending: Nephrology | Admitting: Nephrology

## 2021-07-04 DIAGNOSIS — D509 Iron deficiency anemia, unspecified: Secondary | ICD-10-CM | POA: Insufficient documentation

## 2021-07-04 MED ORDER — SODIUM CHLORIDE 0.9 % IV SOLN
510.0000 mg | Freq: Once | INTRAVENOUS | Status: AC
  Administered 2021-07-04: 510 mg via INTRAVENOUS
  Filled 2021-07-04: qty 17

## 2021-07-04 MED ORDER — SODIUM CHLORIDE 0.9 % IV SOLN: INTRAVENOUS | Status: DC

## 2021-07-12 ENCOUNTER — Encounter (HOSPITAL_COMMUNITY)
Admission: RE | Admit: 2021-07-12 | Discharge: 2021-07-12 | Disposition: A | Discharge status: Home / Self Care | Payer: Medicare Other | Source: Ambulatory Visit | Attending: Nephrology | Admitting: Nephrology

## 2021-07-12 ENCOUNTER — Encounter (HOSPITAL_COMMUNITY): Payer: Self-pay

## 2021-07-12 ENCOUNTER — Other Ambulatory Visit (HOSPITAL_COMMUNITY): Payer: Self-pay | Admitting: Nephrology

## 2021-07-12 ENCOUNTER — Other Ambulatory Visit: Payer: Self-pay | Admitting: Nephrology

## 2021-07-12 DIAGNOSIS — D508 Other iron deficiency anemias: Secondary | ICD-10-CM

## 2021-07-12 DIAGNOSIS — D509 Iron deficiency anemia, unspecified: Secondary | ICD-10-CM | POA: Diagnosis not present

## 2021-07-12 DIAGNOSIS — R809 Proteinuria, unspecified: Secondary | ICD-10-CM

## 2021-07-12 DIAGNOSIS — N1831 Chronic kidney disease, stage 3a: Secondary | ICD-10-CM

## 2021-07-12 IMAGING — DX DG CHEST 2V
2 series · 2 of 2 positions shown · non-contrast
Comparison: 09/26/2018

CLINICAL DATA: Chest pain.  Vomiting.

EXAM:
CHEST - 2 VIEW

[chest pa]
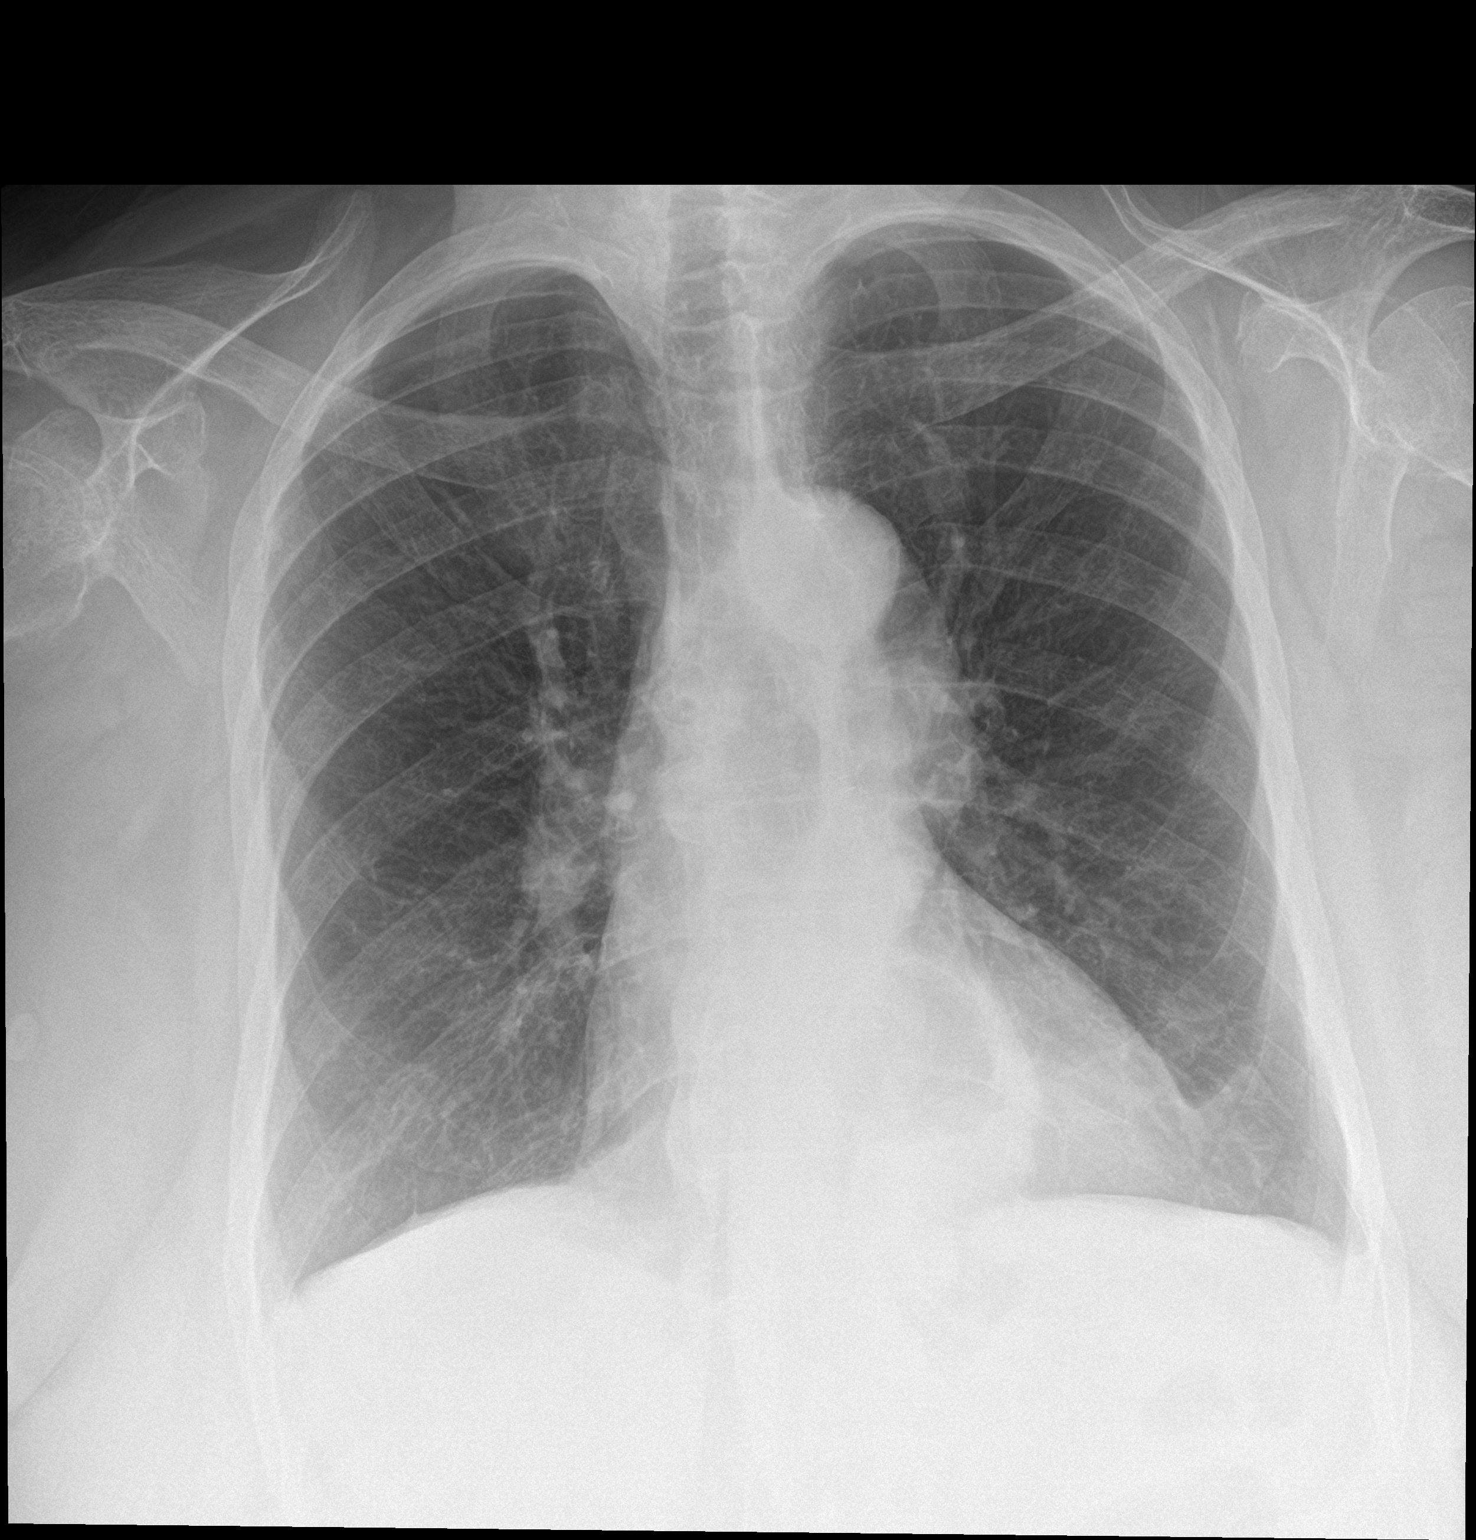

[chest lat]
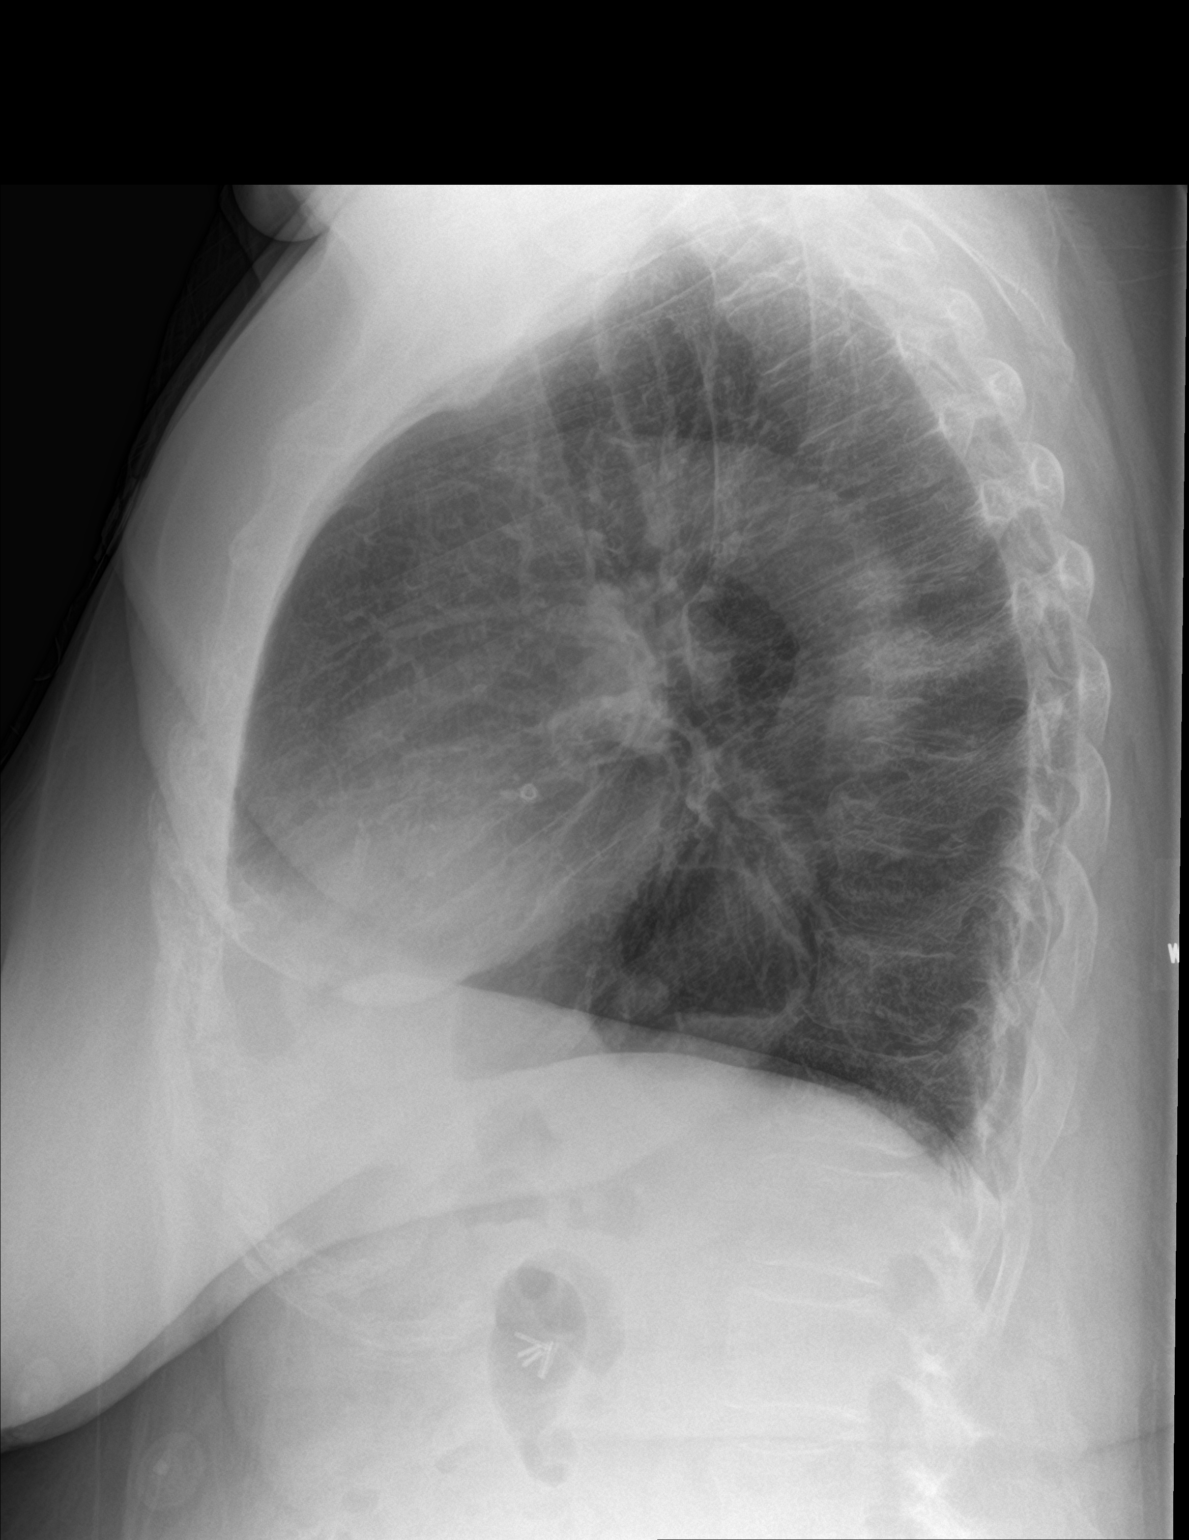

[2 of 2 positions shown; findings below may reference images not displayed]

FINDINGS: Upper normal heart size. Mild aortic tortuosity. Retrocardiac hiatal
hernia. Pulmonary vasculature is normal. No consolidation, pleural
effusion, or pneumothorax. No acute osseous abnormalities are seen.
There is degenerative change in the thoracic spine.
IMPRESSION: No acute chest findings.

Hiatal hernia.

## 2021-07-12 MED ORDER — SODIUM CHLORIDE 0.9 % IV SOLN
510.0000 mg | Freq: Once | INTRAVENOUS | Status: AC
  Administered 2021-07-12: 510 mg via INTRAVENOUS
  Filled 2021-07-12 (×2): qty 17

## 2021-07-12 MED ORDER — SODIUM CHLORIDE 0.9 % IV SOLN: INTRAVENOUS | Status: DC

## 2021-07-12 MED ORDER — SODIUM CHLORIDE 0.9 % IV SOLN
510.0000 mg | Freq: Once | INTRAVENOUS | Status: DC
  Filled 2021-07-12: qty 17

## 2021-07-12 MED ORDER — SODIUM CHLORIDE 0.9 % IV SOLN: Freq: Once | INTRAVENOUS | Status: DC

## 2021-07-13 NOTE — Progress Notes (Signed)
1109-pt was a no show for 1100 appt. I canceled her appt. She arrived at 1111, so I rescheduled her. When I attempted to scan her fluids and feraheme, they would not scan. Reprinted labels and arm band for canceled appt and pt and saline scanned, but the feraheme would not scan. Printed new label for feraheme from canceled appt and it continued to not scan. Message read "No order for this account."

## 2021-07-19 ENCOUNTER — Encounter: Payer: Self-pay | Admitting: Internal Medicine

## 2021-07-23 DIAGNOSIS — Z23 Encounter for immunization: Secondary | ICD-10-CM | POA: Diagnosis not present

## 2021-07-24 ENCOUNTER — Ambulatory Visit (HOSPITAL_COMMUNITY)
Admission: RE | Admit: 2021-07-24 | Discharge: 2021-07-24 | Disposition: A | Discharge status: Home / Self Care | Payer: Medicare Other | Source: Ambulatory Visit | Attending: Nephrology | Admitting: Nephrology

## 2021-07-24 ENCOUNTER — Other Ambulatory Visit: Payer: Self-pay

## 2021-07-24 DIAGNOSIS — N1831 Chronic kidney disease, stage 3a: Secondary | ICD-10-CM | POA: Diagnosis not present

## 2021-07-24 DIAGNOSIS — D508 Other iron deficiency anemias: Secondary | ICD-10-CM | POA: Diagnosis not present

## 2021-07-24 DIAGNOSIS — R809 Proteinuria, unspecified: Secondary | ICD-10-CM

## 2021-07-24 DIAGNOSIS — N189 Chronic kidney disease, unspecified: Secondary | ICD-10-CM | POA: Diagnosis not present

## 2021-08-16 ENCOUNTER — Ambulatory Visit: Payer: Medicare Other | Admitting: Internal Medicine

## 2021-08-31 ENCOUNTER — Encounter: Payer: Self-pay | Admitting: Internal Medicine

## 2021-08-31 ENCOUNTER — Ambulatory Visit: Payer: Medicare Other | Admitting: Internal Medicine

## 2021-10-03 ENCOUNTER — Other Ambulatory Visit: Payer: Self-pay | Admitting: Cardiology

## 2021-10-03 DIAGNOSIS — Z6830 Body mass index (BMI) 30.0-30.9, adult: Secondary | ICD-10-CM | POA: Diagnosis not present

## 2021-10-03 DIAGNOSIS — M25511 Pain in right shoulder: Secondary | ICD-10-CM | POA: Diagnosis not present

## 2021-10-03 DIAGNOSIS — M5030 Other cervical disc degeneration, unspecified cervical region: Secondary | ICD-10-CM | POA: Diagnosis not present

## 2021-10-03 DIAGNOSIS — M19011 Primary osteoarthritis, right shoulder: Secondary | ICD-10-CM | POA: Diagnosis not present

## 2021-10-03 DIAGNOSIS — E669 Obesity, unspecified: Secondary | ICD-10-CM | POA: Diagnosis not present

## 2021-10-18 ENCOUNTER — Other Ambulatory Visit: Payer: Self-pay

## 2021-10-18 ENCOUNTER — Encounter: Payer: Self-pay | Admitting: Cardiology

## 2021-10-18 ENCOUNTER — Ambulatory Visit (INDEPENDENT_AMBULATORY_CARE_PROVIDER_SITE_OTHER): Payer: Medicare Other | Admitting: Cardiology

## 2021-10-18 VITALS — BP 142/82 | HR 87 | Ht 65.0 in | Wt 185.2 lb

## 2021-10-18 DIAGNOSIS — I10 Essential (primary) hypertension: Secondary | ICD-10-CM

## 2021-10-18 DIAGNOSIS — N1831 Chronic kidney disease, stage 3a: Secondary | ICD-10-CM | POA: Diagnosis not present

## 2021-10-18 DIAGNOSIS — I25119 Atherosclerotic heart disease of native coronary artery with unspecified angina pectoris: Secondary | ICD-10-CM | POA: Diagnosis not present

## 2021-10-18 DIAGNOSIS — H401131 Primary open-angle glaucoma, bilateral, mild stage: Secondary | ICD-10-CM | POA: Diagnosis not present

## 2021-10-18 NOTE — Progress Notes (Signed)
Cardiology Office Note  Date: 10/18/2021   ID: [name], [name] 1933-12-27, MRN [id number]  PCP:  Cory Munch, PA-C  Cardiologist:  Rozann Lesches, MD Electrophysiologist:  None   Chief Complaint  Patient presents with   Cardiac follow-up    History of Present Illness: [name] is an 88 y.o. female last seen in August 2022.  She is here for a follow-up visit with her daughter.  Reports no major change in cardiac status, no angina symptoms or nitroglycerin use.  She reports compliance with her medications.  She had follow-up with Dr. Theador Hawthorne in October 2022, I reviewed the note.  Most recently CKD stage IIIa, creatinine 1.2.  She tells me that she plans to reestablish follow-up with her prior nephrologist.  Blood pressure initially elevated when she came in today, I rechecked it at 142/82.  She states that her systolics are consistently under 150 when she checks them blood pressure at home.  Past Medical History:  Diagnosis Date   Arthritis    CKD (chronic kidney disease), stage III ([place])    Complete heart block ([place]) 11/16/2016   a. transient during NSTEMI, resolved with revascularization.   Coronary artery disease 11/2009   a. with prior stent placement to the ramus intermedius and RCA. b. NSTEMI 11/2016 s/p DES to DES to distal Cx with moderate residual dz.   Essential hypertension    Mixed hyperlipidemia    Non-ST elevation (NSTEMI) myocardial infarction ([place]) 11/16/2016   Obesity    Reflux esophagitis    Sleep apnea 09/27/2010   Untreated, REM 64.7/hr AHI 18.5/hr RDI 19.2/hr. Patuent refused CPAP therapy    Past Surgical History:  Procedure Laterality Date   CHOLECYSTECTOMY     CORONARY ANGIOPLASTY WITH STENT PLACEMENT  2007   A 3.0x9mm CYPHER stent post dilated to 3.29 mm the 100% occlusion was reduced to 0%   CORONARY STENT INTERVENTION N/A 11/16/2016   Procedure: Coronary Stent Intervention;  Surgeon: Sherren Mocha, MD;  Location: [facility] CV LAB;   Service: Cardiovascular;  Laterality: N/A;   LEFT HEART CATH AND CORONARY ANGIOGRAPHY N/A 11/16/2016   Procedure: Left Heart Cath and Coronary Angiography;  Surgeon: Sherren Mocha, MD;  Location: [place] CV LAB;  Service: Cardiovascular;  Laterality: N/A;   LEFT HEART CATHETERIZATION WITH CORONARY ANGIOGRAM N/A 07/12/2014   Procedure: LEFT HEART CATHETERIZATION WITH CORONARY ANGIOGRAM;  Surgeon: Blane Ohara, MD;  Location: [facility] CATH LAB;  Service: Cardiovascular;  Laterality: N/A;   TEMPORARY PACEMAKER N/A 11/16/2016   Procedure: Temporary Pacemaker;  Surgeon: Sherren Mocha, MD;  Location: [place] CV LAB;  Service: Cardiovascular;  Laterality: N/A;    Current Outpatient Medications  Medication Sig Dispense Refill   amLODipine (NORVASC) 5 MG tablet TAKE 1 TABLET BY MOUTH EVERY DAY 90 tablet 3   aspirin 81 MG tablet Take 81 mg by mouth daily.     fluticasone (FLONASE) 50 MCG/ACT nasal spray Place 2 sprays into both nostrils daily. 16 g 0   furosemide (LASIX) 40 MG tablet TAKE 1 TABLET BY MOUTH DAILY AS NEEDED FOR EDEMA (Patient taking differently: Take 40 mg by mouth daily as needed for edema.) 30 tablet 0   hydrALAZINE (APRESOLINE) 50 MG tablet TAKE 1 TABLET BY MOUTH TWICE A DAY 180 tablet 3   losartan (COZAAR) 50 MG tablet TAKE 1 TABLET BY MOUTH EVERY DAY 90 tablet 3   nitroGLYCERIN (NITROSTAT) 0.4 MG SL tablet PLACE 1 TABLET UNDER THE TONGUE EVERY 5  MINUTES AS NEEDED. 30 tablet 2   pantoprazole (PROTONIX) 40 MG tablet TAKE 1 TABLET BY MOUTH EVERY DAY 90 tablet 1   rosuvastatin (CRESTOR) 40 MG tablet TAKE 1 TABLET BY MOUTH EVERY DAY 90 tablet 3   benzonatate (TESSALON) 100 MG capsule Take 1 capsule (100 mg total) by mouth every 8 (eight) hours. (Patient not taking: Reported on 10/18/2021) 21 capsule 0   cetirizine (ZYRTEC) 5 MG tablet Take 1 tablet (5 mg total) by mouth daily. (Patient not taking: Reported on 10/18/2021) 20 tablet 0   Vitamin D, Ergocalciferol, (DRISDOL) 50000 units CAPS  capsule Take 50,000 Units by mouth every 30 (thirty) days. (Patient not taking: Reported on 04/28/2021)  0   No current facility-administered medications for this visit.   Allergies:  Atorvastatin, Contrast media [iodinated contrast media], Darvon [propoxyphene], and Codeine   ROS: No palpitations or syncope.  Physical Exam: VS:  BP (!) 142/82    Pulse 87    Ht 5\' 5"  (1.651 m)    Wt 185 lb 3.2 oz (84 kg)    SpO2 99%    BMI 30.82 kg/m , BMI Body mass index is 30.82 kg/m.  Wt Readings from Last 3 Encounters:  10/18/21 185 lb 3.2 oz (84 kg)  07/12/21 179 lb 0.2 oz (81.2 kg)  04/28/21 179 lb (81.2 kg)    General: Patient appears comfortable at rest. HEENT: Conjunctiva and lids normal, wearing a mask. Neck: Supple, no elevated JVP or carotid bruits, no thyromegaly. Lungs: Clear to auscultation, nonlabored breathing at rest. Cardiac: Regular rate and rhythm, no S3 or significant systolic murmur. Extremities: No pitting edema.  ECG:  An ECG dated 06/05/2020 was personally reviewed today and demonstrated:  Sinus rhythm with right bundle branch block and left anterior fascicular block.  Recent Labwork:    Component Value Date/Time   CHOL 120 05/14/2019 0845   CHOL 146 01/19/2015 1048   TRIG 140 05/14/2019 0845   HDL 37 (L) 05/14/2019 0845   HDL 48 01/19/2015 1048   CHOLHDL 3.2 05/14/2019 0845   VLDL 28 05/14/2019 0845   LDLCALC 55 05/14/2019 0845   LDLCALC 77 01/19/2015 1048  May 2021: BUN 12, creatinine 1.45, potassium 4.5, AST 20, ALT 11  Other Studies Reviewed Today:  Echocardiogram 10/08/2018:  1. The left ventricle appears to be normal in size, has severe wall  thickness <65% ejection fraction Spectral Doppler shows impaired  relaxation pattern of diastolic filling.   2. Elevated left ventricular end-diastolic pressure.   3. Mitral valve regurgitation is trivial by color flow Doppler.   4. Moderate mitral annular calcification.   5. Aortic valve regurgitation is mild by  color flow Doppler.   6. There is mild thickening of the aortic valve.   7. There is sclerosis without any evidence of stenosis of the aortic  valve.   8. Moderate annular calcification.   Assessment and Plan:  1.  CAD status post DES to the distal circumflex in March 2018 with patent stent sites in the ramus intermedius and RCA documented at that time.  She does not report any angina symptoms nitroglycerin use and we are following her conservatively on medical therapy at this time.  Continue aspirin, Cozaar, Norvasc, Crestor, and as needed nitroglycerin.  2.  Essential hypertension, continue current regimen including Norvasc, Cozaar, and hydralazine.  3.  CKD stage IIIa, last creatinine 1.2.  Medication Adjustments/Labs and Tests Ordered: Current medicines are reviewed at length with the patient today.  Concerns regarding medicines  are outlined above.   Tests Ordered: No orders of the defined types were placed in this encounter.   Medication Changes: No orders of the defined types were placed in this encounter.   Disposition:  Follow up  6 months.  Signed, Satira Sark, MD, [facility] 10/18/2021 10:08 AM    [place] [facility] [place] [address] [place], [place] [address] Phone: [phone]; Fax: [phone]

## 2021-10-18 NOTE — Patient Instructions (Signed)
Medication Instructions:  Your physician recommends that you continue on your current medications as directed. Please refer to the Current Medication list given to you today.   Labwork: None today  Testing/Procedures: None today  Follow-Up: 6 months  Any Other Special Instructions Will Be Listed Below (If Applicable).  If you need a refill on your cardiac medications before your next appointment, please call your pharmacy.  

## 2021-10-24 DIAGNOSIS — N1832 Chronic kidney disease, stage 3b: Secondary | ICD-10-CM | POA: Diagnosis not present

## 2021-10-24 DIAGNOSIS — R809 Proteinuria, unspecified: Secondary | ICD-10-CM | POA: Diagnosis not present

## 2021-10-24 DIAGNOSIS — D539 Nutritional anemia, unspecified: Secondary | ICD-10-CM | POA: Diagnosis not present

## 2021-10-24 DIAGNOSIS — I129 Hypertensive chronic kidney disease with stage 1 through stage 4 chronic kidney disease, or unspecified chronic kidney disease: Secondary | ICD-10-CM | POA: Diagnosis not present

## 2021-10-24 DIAGNOSIS — N2581 Secondary hyperparathyroidism of renal origin: Secondary | ICD-10-CM | POA: Diagnosis not present

## 2021-10-26 ENCOUNTER — Other Ambulatory Visit: Payer: Self-pay | Admitting: Cardiology

## 2021-10-26 ENCOUNTER — Telehealth: Payer: Self-pay | Admitting: Cardiology

## 2021-10-26 ENCOUNTER — Other Ambulatory Visit: Payer: Self-pay

## 2021-10-26 ENCOUNTER — Emergency Department (HOSPITAL_COMMUNITY)
Admission: EM | Admit: 2021-10-26 | Discharge: 2021-10-26 | Disposition: A | Discharge status: Home / Self Care | Payer: Medicare Other | Attending: Emergency Medicine | Admitting: Emergency Medicine

## 2021-10-26 ENCOUNTER — Encounter (HOSPITAL_COMMUNITY): Payer: Self-pay

## 2021-10-26 DIAGNOSIS — G8929 Other chronic pain: Secondary | ICD-10-CM | POA: Insufficient documentation

## 2021-10-26 DIAGNOSIS — M25562 Pain in left knee: Secondary | ICD-10-CM | POA: Insufficient documentation

## 2021-10-26 DIAGNOSIS — M25561 Pain in right knee: Secondary | ICD-10-CM | POA: Diagnosis not present

## 2021-10-26 DIAGNOSIS — I10 Essential (primary) hypertension: Secondary | ICD-10-CM | POA: Diagnosis not present

## 2021-10-26 DIAGNOSIS — M25511 Pain in right shoulder: Secondary | ICD-10-CM | POA: Insufficient documentation

## 2021-10-26 DIAGNOSIS — M542 Cervicalgia: Secondary | ICD-10-CM | POA: Diagnosis not present

## 2021-10-26 DIAGNOSIS — Z7951 Long term (current) use of inhaled steroids: Secondary | ICD-10-CM | POA: Insufficient documentation

## 2021-10-26 MED ORDER — LOSARTAN POTASSIUM 25 MG PO TABS
50.0000 mg | ORAL_TABLET | Freq: Once | ORAL | Status: AC
  Administered 2021-10-26: 50 mg via ORAL
  Filled 2021-10-26: qty 2

## 2021-10-26 MED ORDER — HYDROCODONE-ACETAMINOPHEN 5-325 MG PO TABS: 1.0000 | ORAL_TABLET | Freq: Four times a day (QID) | ORAL | 0 refills | Status: DC | PRN

## 2021-10-26 MED ORDER — PANTOPRAZOLE SODIUM 40 MG PO TBEC
40.0000 mg | DELAYED_RELEASE_TABLET | Freq: Once | ORAL | Status: AC
  Administered 2021-10-26: 40 mg via ORAL
  Filled 2021-10-26: qty 1

## 2021-10-26 MED ORDER — HYDROCODONE-ACETAMINOPHEN 5-325 MG PO TABS
1.0000 | ORAL_TABLET | Freq: Once | ORAL | Status: AC
  Administered 2021-10-26: 1 via ORAL
  Filled 2021-10-26: qty 1

## 2021-10-26 MED ORDER — HYDRALAZINE HCL 25 MG PO TABS
50.0000 mg | ORAL_TABLET | Freq: Once | ORAL | Status: AC
  Administered 2021-10-26: 50 mg via ORAL
  Filled 2021-10-26: qty 2

## 2021-10-26 MED ORDER — AMLODIPINE BESYLATE 5 MG PO TABS
5.0000 mg | ORAL_TABLET | Freq: Once | ORAL | Status: AC
  Administered 2021-10-26: 5 mg via ORAL
  Filled 2021-10-26: qty 1

## 2021-10-26 MED ORDER — NITROGLYCERIN 0.4 MG SL SUBL: SUBLINGUAL_TABLET | SUBLINGUAL | 3 refills | Status: DC

## 2021-10-26 NOTE — Discharge Instructions (Addendum)
Be sure to take your blood pressure medicine as prescribed every day. Check your blood pressure at the same time each day after sitting for 5 minutes.  Record those numbers and be sure to give it to your kidney specialist

## 2021-10-26 NOTE — ED Triage Notes (Signed)
Pt to ED from home c/o hypertension for 2 weeks with hx of same. Unclear if she's been taking her home meds as prescribed.

## 2021-10-26 NOTE — Telephone Encounter (Signed)
Pt c/o medication issue:  1. Name of Medication: nitroGLYCERIN (NITROSTAT) 0.4 MG SL tablet  2. How are you currently taking this medication (dosage and times per day)? PLACE 1 TABLET UNDER THE TONGUE EVERY 5 MINUTES AS NEEDED.  3. Are you having a reaction (difficulty breathing--STAT)? No   4. What is your medication issue? Has old prescription. Needs a new prescription sent to  [id number] - [place], [place] - [address] AT [facility]

## 2021-10-26 NOTE — Telephone Encounter (Signed)
Filled as requested

## 2021-10-26 NOTE — ED Provider Notes (Signed)
[facility] EMERGENCY DEPARTMENT Provider Note   CSN: [id number] Arrival date & time: 10/26/21  0436     History  Chief Complaint  Patient presents with   Hypertension    [name] is a 88 y.o. female.  The history is provided by the patient and a relative.  Hypertension This is a chronic problem. The current episode started more than 2 days ago. The problem occurs daily. The problem has been gradually worsening. Associated symptoms include headaches. Pertinent negatives include no chest pain. Nothing aggravates the symptoms. Nothing relieves the symptoms.  Patient presents for elevated blood pressure.  She reports over the past several weeks she has noted that her blood pressure is continuing to elevate.  She has been seen by her cardiologist and nephrologist and they have also noted elevated blood pressure.  Patient admits that she does not always take her medicines as prescribed. She reports that she does have chronic pain at times in her right shoulder, neck and both knees which also worsens her symptoms.  She was recently seen by her nephrologist, since that time she has been checking her blood pressure up to 9 times a day   This morning she woke up I decided to check her blood pressure and it was systolic over 200.  She has not taken her morning meds yet.  She woke up her daughter and decided to come to the ER.  She reports mild headache that is improving.  No chest pain reported, no focal weakness report Home Medications Prior to Admission medications   Medication Sig Start Date End Date Taking? Authorizing Provider  amLODipine (NORVASC) 5 MG tablet TAKE 1 TABLET BY MOUTH EVERY DAY 06/13/21   Jonelle Sidle, MD  aspirin 81 MG tablet Take 81 mg by mouth daily.    [provider]  benzonatate (TESSALON) 100 MG capsule Take 1 capsule (100 mg total) by mouth every 8 (eight) hours. Patient not taking: Reported on 10/18/2021 01/28/21   Wurst, [place], PA-C  cetirizine  (ZYRTEC) 5 MG tablet Take 1 tablet (5 mg total) by mouth daily. Patient not taking: Reported on 10/18/2021 01/28/21   Wurst, [place], PA-C  fluticasone [facility]) 50 MCG/ACT nasal spray Place 2 sprays into both nostrils daily. 01/28/21   Wurst, [place], PA-C  furosemide (LASIX) 40 MG tablet TAKE 1 TABLET BY MOUTH DAILY AS NEEDED FOR EDEMA Patient taking differently: Take 40 mg by mouth daily as needed for edema. 10/10/15   Lennette Bihari, MD  hydrALAZINE (APRESOLINE) 50 MG tablet TAKE 1 TABLET BY MOUTH TWICE A DAY 04/13/20   Jonelle Sidle, MD  losartan (COZAAR) 50 MG tablet TAKE 1 TABLET BY MOUTH EVERY DAY 01/24/21   Jonelle Sidle, MD  nitroGLYCERIN (NITROSTAT) 0.4 MG SL tablet PLACE 1 TABLET UNDER THE TONGUE EVERY 5 MINUTES AS NEEDED. 09/14/20   Jonelle Sidle, MD  pantoprazole (PROTONIX) 40 MG tablet TAKE 1 TABLET BY MOUTH EVERY DAY 05/29/21   Jonelle Sidle, MD  rosuvastatin (CRESTOR) 40 MG tablet TAKE 1 TABLET BY MOUTH EVERY DAY 10/03/21   Jonelle Sidle, MD  Vitamin D, Ergocalciferol, (DRISDOL) 50000 units CAPS capsule Take 50,000 Units by mouth every 30 (thirty) days. Patient not taking: Reported on 04/28/2021 11/07/16   [provider]      Allergies    Atorvastatin, Contrast media [iodinated contrast media], Darvon [propoxyphene], and Codeine    Review of Systems   Review of Systems  Constitutional:  Negative for  fever.  Eyes:  Positive for redness.  Cardiovascular:  Negative for chest pain.  Musculoskeletal:  Positive for arthralgias and neck pain.       Chronic neck, shoulder and bilateral knee pain  Neurological:  Positive for headaches. Negative for weakness.   Physical Exam Updated Vital Signs BP (!) 212/102    Pulse 74    Temp 98.4 F (36.9 C)    Resp 18    Ht 1.651 m (5\' 5" )    Wt 82.1 kg    SpO2 97%    BMI 30.12 kg/m  Physical Exam CONSTITUTIONAL: Elderly, anxious HEAD: Normocephalic/atraumatic EYES: EOMI/PERRL, no nystagmus, no ptosis, mild  conjunctival erythema, no corneal haziness ENMT: Mucous membranes moist NECK: supple no meningeal signs CV: S1/S2 noted, no murmurs/rubs/gallops noted LUNGS: Lungs are clear to auscultation bilaterally, no apparent distress ABDOMEN: soft, nontender, no rebound or guarding GU:no cva tenderness NEURO:Awake/alert, face symmetric, no arm or leg drift is noted Cranial nerves 3/4/5/6/03/18/09/11/12 tested and intact Sensation to light touch intact in all extremities EXTREMITIES: pulses normal, full ROM, tenderness noted to right shoulder, no deformities but she has limitation in lifting up her right arm due to pain SKIN: warm, color normal PSYCH: Anxious and intermittently tearful  ED Results / Procedures / Treatments   Labs (all labs ordered are listed, but only abnormal results are displayed) Labs Reviewed - No data to display  EKG EKG Interpretation  Date/Time:  Thursday October 26 2021 04:50:35 EST Ventricular Rate:  72 PR Interval:  185 QRS Duration: 125 QT Interval:  444 QTC Calculation: 486 R Axis:   -65 Text Interpretation: Sinus arrhythmia RBBB and LAFB No significant change since last tracing Confirmed by [id number] (Zadie Rhine) on 10/26/2021 5:58:54 AM  Radiology No results found.  Procedures Procedures    Medications Ordered in ED Medications  HYDROcodone-acetaminophen (NORCO/VICODIN) 5-325 MG per tablet 1 tablet (1 tablet Oral Given 10/26/21 0608)  amLODipine (NORVASC) tablet 5 mg (5 mg Oral Given 10/26/21 10/28/21)  hydrALAZINE (APRESOLINE) tablet 50 mg (50 mg Oral Given 10/26/21 0609)  losartan (COZAAR) tablet 50 mg (50 mg Oral Given 10/26/21 0608)  pantoprazole (PROTONIX) EC tablet 40 mg (40 mg Oral Given 10/26/21 10/28/21)    ED Course/ Medical Decision Making/ A&P Clinical Course as of 10/26/21 0713  Thu Oct 26, 2021  0712 Patient appears improved. No chest pain is reported.  She is still having pain in her neck and shoulder which is chronic. Blood pressure measured  at 7 AM was error as she was moving her arm.  Repeat pressure was improved around 212/84. [DW]  [phone] Plan for discharge home.  She request pain medications for her arthralgias.  This will also likely help her blood pressure.  She will take her pressure as instructed and check it 2-3 times a day.  She will report back to her cardiologist in about a week with her BP findings if they continue be elevated. [DW]    Clinical Course User Index [DW] 2297, MD                           Medical Decision Making Amount and/or Complexity of Data Reviewed ECG/medicine tests: ordered.  Risk Prescription drug management.   This patient presents to the ED for concern of hypertension, this involves an extensive number of treatment options, and is a complaint that carries with it a high risk of complications and morbidity.  The differential diagnosis includes uncontrolled  hypertension, nonadherence to medications, hypertensive emergency, CVA, acute MI  Comorbidities that complicate the patient evaluation: Patients presentation is complicated by their history of hypertension and chronic kidney disease  Social Determinants of Health: Patients  intermittent adherence to medication regimen   increases the complexity of managing their presentation  Additional history obtained: Additional history obtained from  daughter Records reviewed Care Everywhere/External Records Recent cardiology notes and nephrology notes reviewed.  Patient had labs performed on February 14 that I personally reviewed   Cardiac Monitoring: The patient was maintained on a cardiac monitor.  I personally viewed and interpreted the cardiac monitor which showed an underlying rhythm of: Sinus rhythm  Medicines ordered and prescription drug management: I ordered medication including Vicodin for arthritis Losartan/amlodipine/hydralazine for high blood pressure Reevaluation of the patient after these medicines showed that the  patient    improved  Test Considered: Considered labs, patient just had this done as an outpatient and they were reviewed  Reevaluation: After the interventions noted above, I reevaluated the patient and found that they have :improved  Complexity of problems addressed: Patients presentation is most consistent with  acute complicated illness/injury requiring diagnostic workup  Disposition: After consideration of the diagnostic results and the patients response to treatment,  I feel that the patent would benefit from discharge   .   7:14 AM Patient presents for essentially asymptomatic hypertension. No signs of hypertensive emergency She is awake alert, no signs of stroke.  No chest pains reported.  Her mental status is at baseline. BP is likely elevated due to nonadherence as well as chronic arthritis pain Discussed need to take medicines daily and to record her findings and can report back to cardiology        Final Clinical Impression(s) / ED Diagnoses Final diagnoses:  Primary hypertension  Acute pain of right shoulder    Rx / DC Orders ED Discharge Orders          Ordered    HYDROcodone-acetaminophen (NORCO/VICODIN) 5-325 MG tablet  Every 6 hours PRN        10/26/21 6761              Zadie Rhine, MD 10/26/21 0715

## 2021-10-28 ENCOUNTER — Emergency Department (HOSPITAL_COMMUNITY): Payer: Medicare Other

## 2021-10-28 ENCOUNTER — Other Ambulatory Visit: Payer: Self-pay

## 2021-10-28 ENCOUNTER — Emergency Department (HOSPITAL_COMMUNITY)
Admission: EM | Admit: 2021-10-28 | Discharge: 2021-10-28 | Disposition: A | Discharge status: Home / Self Care | Payer: Medicare Other | Attending: Emergency Medicine | Admitting: Emergency Medicine

## 2021-10-28 ENCOUNTER — Encounter (HOSPITAL_COMMUNITY): Payer: Self-pay | Admitting: *Deleted

## 2021-10-28 DIAGNOSIS — Z7982 Long term (current) use of aspirin: Secondary | ICD-10-CM | POA: Insufficient documentation

## 2021-10-28 DIAGNOSIS — Z79899 Other long term (current) drug therapy: Secondary | ICD-10-CM | POA: Insufficient documentation

## 2021-10-28 DIAGNOSIS — I10 Essential (primary) hypertension: Secondary | ICD-10-CM | POA: Insufficient documentation

## 2021-10-28 DIAGNOSIS — M2578 Osteophyte, vertebrae: Secondary | ICD-10-CM | POA: Diagnosis not present

## 2021-10-28 DIAGNOSIS — M25511 Pain in right shoulder: Secondary | ICD-10-CM | POA: Diagnosis not present

## 2021-10-28 DIAGNOSIS — M542 Cervicalgia: Secondary | ICD-10-CM | POA: Insufficient documentation

## 2021-10-28 MED ORDER — OXYCODONE-ACETAMINOPHEN 5-325 MG PO TABS: 1.0000 | ORAL_TABLET | Freq: Three times a day (TID) | ORAL | 0 refills | Status: AC | PRN

## 2021-10-28 MED ORDER — OXYCODONE-ACETAMINOPHEN 5-325 MG PO TABS
1.0000 | ORAL_TABLET | Freq: Once | ORAL | Status: AC
  Administered 2021-10-28: 1 via ORAL
  Filled 2021-10-28: qty 1

## 2021-10-28 MED ORDER — CYCLOBENZAPRINE HCL 5 MG PO TABS: 10.0000 mg | ORAL_TABLET | Freq: Two times a day (BID) | ORAL | 0 refills | Status: AC | PRN

## 2021-10-28 MED ORDER — PREDNISONE 10 MG PO TABS: 20.0000 mg | ORAL_TABLET | Freq: Every day | ORAL | 0 refills | Status: AC

## 2021-10-28 NOTE — ED Provider Notes (Signed)
[facility] EMERGENCY DEPARTMENT Provider Note   CSN: [id number] Arrival date & time: 10/28/21  1119     History  Chief Complaint  Patient presents with   Hypertension    [name] is a 88 y.o. female.  HPI  Patient with medical history including hypertension, arthritis presents with complaints of continuing pain in her neck as well as her right shoulder.  Patient states that she was seen a few days ago and was prescribed pain medications but unfortunately pharmacy did not have this and she has been without her medication.  She states that the pain in her neck as well as her right shoulder has been going on for years, states she was diagnosed with also with arthritis, she denies any recent trauma to the area, pain is worsened in her neck with movements as well as her right shoulder she denies any paresthesias or weakness moving down her arms.  Patient she was taking medication not much relief.  She has no other complaints.  Reviewed patient's chart was seen 2 days ago for same presentation, she initially had high blood pressure as well as pain with her neck and shoulder, she was restarted on her blood pressure medications, was discharged with Vicodin.  Patient daughter was at bedside she endorses that patient has been having this chronic pain for years, she has been followed by neurosurgery as well as orthopedic surgery but unfortunate to her comorbidities she she is not a candidate for surgery at this time.   Home Medications Prior to Admission medications   Medication Sig Start Date End Date Taking? Authorizing Provider  cyclobenzaprine (FLEXERIL) 5 MG tablet Take 2 tablets (10 mg total) by mouth 2 (two) times daily as needed for up to 10 days for muscle spasms. 10/28/21 11/07/21 Yes Carroll Sage, PA-C  oxyCODONE-acetaminophen (PERCOCET/ROXICET) 5-325 MG tablet Take 1 tablet by mouth every 8 (eight) hours as needed for up to 3 days for severe pain. 10/28/21 10/31/21 Yes  Carroll Sage, PA-C  predniSONE (DELTASONE) 10 MG tablet Take 2 tablets (20 mg total) by mouth daily for 5 days. 10/28/21 11/02/21 Yes Carroll Sage, PA-C  amLODipine (NORVASC) 5 MG tablet TAKE 1 TABLET BY MOUTH EVERY DAY 06/13/21   Jonelle Sidle, MD  aspirin 81 MG tablet Take 81 mg by mouth daily.    [provider]  fluticasone (FLONASE) 50 MCG/ACT nasal spray Place 2 sprays into both nostrils daily. 01/28/21   Wurst, [place], PA-C  furosemide (LASIX) 40 MG tablet TAKE 1 TABLET BY MOUTH DAILY AS NEEDED FOR EDEMA Patient taking differently: Take 40 mg by mouth daily as needed for edema. 10/10/15   Lennette Bihari, MD  hydrALAZINE (APRESOLINE) 50 MG tablet TAKE 1 TABLET BY MOUTH TWICE A DAY 04/13/20   Jonelle Sidle, MD  HYDROcodone-acetaminophen (NORCO/VICODIN) 5-325 MG tablet Take 1 tablet by mouth every 6 (six) hours as needed. 10/26/21   Zadie Rhine, MD  losartan (COZAAR) 50 MG tablet TAKE 1 TABLET BY MOUTH EVERY DAY 01/24/21   Jonelle Sidle, MD  nitroGLYCERIN (NITROSTAT) 0.4 MG SL tablet PLACE 1 TABLET UNDER THE TONGUE EVERY 5 MINUTES AS NEEDED. 10/26/21   Jonelle Sidle, MD  pantoprazole (PROTONIX) 40 MG tablet TAKE 1 TABLET BY MOUTH EVERY DAY 10/26/21   Jonelle Sidle, MD  rosuvastatin (CRESTOR) 40 MG tablet TAKE 1 TABLET BY MOUTH EVERY DAY 10/03/21   Jonelle Sidle, MD  Vitamin D, Ergocalciferol, (DRISDOL) 50000 units CAPS  capsule Take 50,000 Units by mouth every 30 (thirty) days. Patient not taking: Reported on 04/28/2021 11/07/16   [provider]      Allergies    Atorvastatin, Contrast media [iodinated contrast media], Darvon [propoxyphene], and Codeine    Review of Systems   Review of Systems  Constitutional:  Negative for chills and fever.  Respiratory:  Negative for shortness of breath.   Cardiovascular:  Negative for chest pain.  Gastrointestinal:  Negative for abdominal pain.  Musculoskeletal:  Positive for neck pain.        Right shoulder pain  Neurological:  Negative for headaches.   Physical Exam Updated Vital Signs BP (!) 152/68    Pulse 70    Temp 98 F (36.7 C) (Oral)    Resp 15    SpO2 99%  Physical Exam Vitals and nursing note reviewed.  Constitutional:      General: She is not in acute distress.    Appearance: She is not ill-appearing.  HENT:     Head: Normocephalic and atraumatic.     Nose: No congestion.  Eyes:     Conjunctiva/sclera: Conjunctivae normal.  Cardiovascular:     Rate and Rhythm: Normal rate and regular rhythm.  Pulmonary:     Effort: Pulmonary effort is normal.  Musculoskeletal:     Comments: Spine was palpated nontender to palpation no step-off deformities noted, patient had noted tenderness within her right deltoid as well as her right shoulder, pain was focalized reproducible, she still has full range of motion her fingers wrist elbow bilaterally.  She had slightly limited range of motion in her right shoulder in comparison to her left.  Neurovascularly fully intact in the upper extremities bilaterally  Skin:    General: Skin is warm and dry.  Neurological:     Mental Status: She is alert.  Psychiatric:        Mood and Affect: Mood normal.    ED Results / Procedures / Treatments   Labs (all labs ordered are listed, but only abnormal results are displayed) Labs Reviewed - No data to display  EKG None  Radiology DG Shoulder Right  Result Date: 10/28/2021 CLINICAL DATA:  Right shoulder pain EXAM: RIGHT SHOULDER - 2+ VIEW COMPARISON:  None. FINDINGS: No fracture or dislocation is seen. Severe degenerative changes are noted in the right shoulder. There is marked joint space narrowing. Bony spurs are noted. Undulations are seen in the articular surface of head of the right humerus which may be related to degenerative arthritis or residual change from previous injury. IMPRESSION: No fracture or dislocation is seen in the right shoulder. Severe degenerative changes are noted.  Electronically Signed   By: Ernie Avena M.D.   On: 10/28/2021 13:25   CT Cervical Spine Wo Contrast  Result Date: 10/28/2021 CLINICAL DATA:  Right-sided neck and shoulder pain, chronic degenerative changes reported by prior x-ray, remote history of fall 30 years ago EXAM: CT CERVICAL SPINE WITHOUT CONTRAST TECHNIQUE: Multidetector CT imaging of the cervical spine was performed without intravenous contrast. Multiplanar CT image reconstructions were also generated. RADIATION DOSE REDUCTION: This exam was performed according to the departmental dose-optimization program which includes automated exposure control, adjustment of the mA and/or kV according to patient size and/or use of iterative reconstruction technique. COMPARISON:  None. FINDINGS: Alignment: Degenerative straightening of the normal cervical lordosis. Skull base and vertebrae: No acute fracture. 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 No primary bone lesion or focal pathologic process. Soft  tissues and spinal canal: No prevertebral fluid or swelling. No visible canal hematoma. Disc levels: Moderate to severe disc space height loss and osteophytosis of C3 through C7. Upper chest: Negative. Other: None. IMPRESSION: 1. No fracture or static subluxation of the cervical spine. 2. Moderate to severe disc space height loss and osteophytosis of C3 through C7. Cervical disc and neural foraminal pathology may be further evaluated by MRI if indicated by neurologically localizing signs and symptoms. Electronically Signed   By: Jearld Lesch M.D.   On: 10/28/2021 13:12    Procedures Procedures    Medications Ordered in ED Medications  oxyCODONE-acetaminophen (PERCOCET/ROXICET) 5-325 MG per tablet 1 tablet (1 tablet Oral Given 10/28/21 1253)    ED Course/ Medical Decision Making/ A&P                           Medical Decision Making Amount and/or Complexity of Data Reviewed Radiology: ordered.  Risk Prescription  drug management.   This patient presents to the ED for concern of pain shoulder and neck, this involves an extensive number of treatment options, and is a complaint that carries with it a high risk of complications and morbidity.  The differential diagnosis includes fractures, dislocation, dissection    Additional history obtained:  Additional history obtained from electronic medical record, daughter was at bedside External records from outside source obtained and reviewed including please see HPI   Co morbidities that complicate the patient evaluation  Arthritis,  Social Determinants of Health:  Age    Lab Tests:  I Ordered, and personally interpreted labs.  The pertinent results include: N/A   Imaging Studies ordered:  I ordered imaging studies including x-ray of right shoulder as well as cervical spine I independently visualized and interpreted imaging which showed both which were negative for acute findings. I agree with the radiologist interpretation    Rule out I have low suspicion for dissection or aneurysm within the vascular of the neck as presentation is atypical of etiology, pain is focalized, reproducible, worsened with movements, imaging is consistent with arthritis at within the neck and shoulder consistent with her pain.  I have low suspicion for fracture dislocation as imaging is negative for these findings.  Low suspicion for spinal cord abnormality as she has no paresthesias or weakness in the upper extremities.    Dispostion and problem list  After consideration of the diagnostic results and the patients response to treatment, I feel that the patent would benefit from discharge.  Neck and shoulder pain-likely acute on chronic pain, will start her on steroids, muscle relaxers, short course of narcotics follow-up with PCP as needed.  Given strict return precautions.            Final Clinical Impression(s) / ED Diagnoses Final diagnoses:   Neck pain  Acute pain of right shoulder    Rx / DC Orders ED Discharge Orders          Ordered    oxyCODONE-acetaminophen (PERCOCET/ROXICET) 5-325 MG tablet  Every 8 hours PRN        10/28/21 1352    cyclobenzaprine (FLEXERIL) 5 MG tablet  2 times daily PRN        10/28/21 1352    predniSONE (DELTASONE) 10 MG tablet  Daily        10/28/21 1352              Carroll Sage, PA-C 10/28/21 1353    Tanda Rockers  A, DO 10/28/21 1614

## 2021-10-28 NOTE — ED Notes (Signed)
Patient seen on Tuesday for neck pain and given prescription for pain medication (Vicodin 5/325mg )  but was unable to get it filled at pharmacy due to it being on back order. Per family was told to come back to ED for reassessment to get new prescription. Per patient blood pressure increasing due to pain and inability to sleep because of pain.  Denies any neurological deficits. Per family, medication helped greatly when administered her in ED before discharge on Tuesday.

## 2021-10-28 NOTE — Discharge Instructions (Signed)
You have been seen here for neck and shoulder pain. I recommend taking over-the-counter pain medications like ibuprofen and/or Tylenol every 6 as needed.  Please follow dosage and on the back of bottle.  I also recommend applying heat to the area and stretching out the muscles as this will help decrease stiffness and pain.  I have given you information on exercises please follow.  I have given you a short course of narcotics please take as prescribed.  This medication can make you drowsy do not consume alcohol or operate heavy machinery when taking this medication.  This medication is Tylenol in it do not take Tylenol and take this medication.  I have also given you a prescription for a muscle relaxer this can make you drowsy do not consume alcohol or operate heavy machinery when taking this medication.   Please follow-up with PCP as needed.  Come back to the emergency department if you develop chest pain, shortness of breath, severe abdominal pain, uncontrolled nausea, vomiting, diarrhea.

## 2021-10-28 NOTE — ED Triage Notes (Signed)
Pt not able to get her pain medication filled at her pharmacy, pt seen two days ago.  Pt c/o pain.  189/112 at home

## 2021-11-05 ENCOUNTER — Other Ambulatory Visit: Payer: Self-pay

## 2021-11-05 ENCOUNTER — Encounter (HOSPITAL_COMMUNITY): Payer: Self-pay | Admitting: *Deleted

## 2021-11-05 ENCOUNTER — Ambulatory Visit (HOSPITAL_COMMUNITY)
Admission: EM | Admit: 2021-11-05 | Discharge: 2021-11-05 | Disposition: A | Discharge status: Other Acute Inpatient Hospital | Payer: Medicare Other

## 2021-11-05 ENCOUNTER — Emergency Department (HOSPITAL_COMMUNITY): Payer: Medicare Other

## 2021-11-05 ENCOUNTER — Encounter (HOSPITAL_COMMUNITY): Payer: Self-pay | Admitting: Emergency Medicine

## 2021-11-05 ENCOUNTER — Inpatient Hospital Stay (HOSPITAL_COMMUNITY)
Admission: EM | Admit: 2021-11-05 | Discharge: 2021-11-07 | DRG: 065 | Disposition: A | Discharge status: Home Health | Payer: Medicare Other | Attending: Internal Medicine | Admitting: Internal Medicine

## 2021-11-05 DIAGNOSIS — Z8249 Family history of ischemic heart disease and other diseases of the circulatory system: Secondary | ICD-10-CM

## 2021-11-05 DIAGNOSIS — I6389 Other cerebral infarction: Secondary | ICD-10-CM | POA: Diagnosis not present

## 2021-11-05 DIAGNOSIS — Z6831 Body mass index (BMI) 31.0-31.9, adult: Secondary | ICD-10-CM

## 2021-11-05 DIAGNOSIS — I252 Old myocardial infarction: Secondary | ICD-10-CM | POA: Diagnosis not present

## 2021-11-05 DIAGNOSIS — E785 Hyperlipidemia, unspecified: Secondary | ICD-10-CM | POA: Diagnosis not present

## 2021-11-05 DIAGNOSIS — I6329 Cerebral infarction due to unspecified occlusion or stenosis of other precerebral arteries: Principal | ICD-10-CM | POA: Diagnosis present

## 2021-11-05 DIAGNOSIS — R200 Anesthesia of skin: Secondary | ICD-10-CM | POA: Diagnosis not present

## 2021-11-05 DIAGNOSIS — Z66 Do not resuscitate: Secondary | ICD-10-CM | POA: Diagnosis present

## 2021-11-05 DIAGNOSIS — I10 Essential (primary) hypertension: Secondary | ICD-10-CM | POA: Diagnosis present

## 2021-11-05 DIAGNOSIS — G8929 Other chronic pain: Secondary | ICD-10-CM | POA: Diagnosis present

## 2021-11-05 DIAGNOSIS — Z888 Allergy status to other drugs, medicaments and biological substances status: Secondary | ICD-10-CM

## 2021-11-05 DIAGNOSIS — I2510 Atherosclerotic heart disease of native coronary artery without angina pectoris: Secondary | ICD-10-CM | POA: Diagnosis not present

## 2021-11-05 DIAGNOSIS — K219 Gastro-esophageal reflux disease without esophagitis: Secondary | ICD-10-CM

## 2021-11-05 DIAGNOSIS — I452 Bifascicular block: Secondary | ICD-10-CM | POA: Diagnosis present

## 2021-11-05 DIAGNOSIS — Z9049 Acquired absence of other specified parts of digestive tract: Secondary | ICD-10-CM

## 2021-11-05 DIAGNOSIS — Z79899 Other long term (current) drug therapy: Secondary | ICD-10-CM

## 2021-11-05 DIAGNOSIS — N1832 Chronic kidney disease, stage 3b: Secondary | ICD-10-CM | POA: Diagnosis present

## 2021-11-05 DIAGNOSIS — I739 Peripheral vascular disease, unspecified: Secondary | ICD-10-CM | POA: Diagnosis not present

## 2021-11-05 DIAGNOSIS — R29703 NIHSS score 3: Secondary | ICD-10-CM | POA: Diagnosis not present

## 2021-11-05 DIAGNOSIS — R29700 NIHSS score 0: Secondary | ICD-10-CM | POA: Diagnosis present

## 2021-11-05 DIAGNOSIS — M1990 Unspecified osteoarthritis, unspecified site: Secondary | ICD-10-CM | POA: Diagnosis present

## 2021-11-05 DIAGNOSIS — I639 Cerebral infarction, unspecified: Secondary | ICD-10-CM | POA: Diagnosis not present

## 2021-11-05 DIAGNOSIS — I129 Hypertensive chronic kidney disease with stage 1 through stage 4 chronic kidney disease, or unspecified chronic kidney disease: Secondary | ICD-10-CM | POA: Diagnosis present

## 2021-11-05 DIAGNOSIS — E782 Mixed hyperlipidemia: Secondary | ICD-10-CM | POA: Diagnosis present

## 2021-11-05 DIAGNOSIS — R29706 NIHSS score 6: Secondary | ICD-10-CM | POA: Diagnosis not present

## 2021-11-05 DIAGNOSIS — Z9861 Coronary angioplasty status: Secondary | ICD-10-CM

## 2021-11-05 DIAGNOSIS — Z7982 Long term (current) use of aspirin: Secondary | ICD-10-CM

## 2021-11-05 DIAGNOSIS — Z885 Allergy status to narcotic agent status: Secondary | ICD-10-CM

## 2021-11-05 DIAGNOSIS — R29818 Other symptoms and signs involving the nervous system: Secondary | ICD-10-CM | POA: Diagnosis not present

## 2021-11-05 DIAGNOSIS — R202 Paresthesia of skin: Secondary | ICD-10-CM | POA: Diagnosis not present

## 2021-11-05 DIAGNOSIS — Z20822 Contact with and (suspected) exposure to covid-19: Secondary | ICD-10-CM | POA: Diagnosis present

## 2021-11-05 DIAGNOSIS — G4733 Obstructive sleep apnea (adult) (pediatric): Secondary | ICD-10-CM | POA: Diagnosis present

## 2021-11-05 DIAGNOSIS — M25511 Pain in right shoulder: Secondary | ICD-10-CM | POA: Diagnosis present

## 2021-11-05 DIAGNOSIS — Z955 Presence of coronary angioplasty implant and graft: Secondary | ICD-10-CM

## 2021-11-05 DIAGNOSIS — E669 Obesity, unspecified: Secondary | ICD-10-CM | POA: Diagnosis present

## 2021-11-05 DIAGNOSIS — G319 Degenerative disease of nervous system, unspecified: Secondary | ICD-10-CM | POA: Diagnosis not present

## 2021-11-05 DIAGNOSIS — G8191 Hemiplegia, unspecified affecting right dominant side: Secondary | ICD-10-CM | POA: Diagnosis present

## 2021-11-05 DIAGNOSIS — Z91041 Radiographic dye allergy status: Secondary | ICD-10-CM

## 2021-11-05 LAB — DIFFERENTIAL
Abs Immature Granulocytes: 0.02 10*3/uL (ref 0.00–0.07)
Basophils Absolute: 0.1 10*3/uL (ref 0.0–0.1)
Basophils Relative: 1 %
Eosinophils Absolute: 0.1 10*3/uL (ref 0.0–0.5)
Eosinophils Relative: 2 %
Immature Granulocytes: 0 %
Lymphocytes Relative: 28 %
Lymphs Abs: 1.8 10*3/uL (ref 0.7–4.0)
Monocytes Absolute: 0.6 10*3/uL (ref 0.1–1.0)
Monocytes Relative: 10 %
Neutro Abs: 3.9 10*3/uL (ref 1.7–7.7)
Neutrophils Relative %: 59 %

## 2021-11-05 LAB — COMPREHENSIVE METABOLIC PANEL
ALT: 24 U/L (ref 0–44)
AST: 31 U/L (ref 15–41)
Albumin: 3.1 g/dL — ABNORMAL LOW (ref 3.5–5.0)
Alkaline Phosphatase: 86 U/L (ref 38–126)
Anion gap: 7 (ref 5–15)
BUN: 14 mg/dL (ref 8–23)
CO2: 25 mmol/L (ref 22–32)
Calcium: 9.2 mg/dL (ref 8.9–10.3)
Chloride: 106 mmol/L (ref 98–111)
Creatinine, Ser: 1.27 mg/dL — ABNORMAL HIGH (ref 0.44–1.00)
GFR, Estimated: 41 mL/min — ABNORMAL LOW (ref >60)
Glucose, Bld: 120 mg/dL — ABNORMAL HIGH (ref 70–99)
Potassium: 4.1 mmol/L (ref 3.5–5.1)
Sodium: 138 mmol/L (ref 135–145)
Total Bilirubin: 0.6 mg/dL (ref 0.3–1.2)
Total Protein: 6.5 g/dL (ref 6.5–8.1)

## 2021-11-05 LAB — I-STAT CHEM 8, ED
BUN: 15 mg/dL (ref 8–23)
Calcium, Ion: 1.15 mmol/L (ref 1.15–1.40)
Chloride: 106 mmol/L (ref 98–111)
Creatinine, Ser: 1.2 mg/dL — ABNORMAL HIGH (ref 0.44–1.00)
Glucose, Bld: 115 mg/dL — ABNORMAL HIGH (ref 70–99)
HCT: 40 % (ref 36.0–46.0)
Hemoglobin: 13.6 g/dL (ref 12.0–15.0)
Potassium: 4.1 mmol/L (ref 3.5–5.1)
Sodium: 141 mmol/L (ref 135–145)
TCO2: 23 mmol/L (ref 22–32)

## 2021-11-05 LAB — CBC
HCT: 40 % (ref 36.0–46.0)
Hemoglobin: 13.2 g/dL (ref 12.0–15.0)
MCH: 29.5 pg (ref 26.0–34.0)
MCHC: 33 g/dL (ref 30.0–36.0)
MCV: 89.3 fL (ref 80.0–100.0)
Platelets: 179 10*3/uL (ref 150–400)
RBC: 4.48 MIL/uL (ref 3.87–5.11)
RDW: 15.3 % (ref 11.5–15.5)
WBC: 6.5 10*3/uL (ref 4.0–10.5)
nRBC: 0 % (ref 0.0–0.2)

## 2021-11-05 LAB — APTT: aPTT: 27 seconds (ref 24–36)

## 2021-11-05 LAB — PROTIME-INR
INR: 1 (ref 0.8–1.2)
Prothrombin Time: 13.2 seconds (ref 11.4–15.2)

## 2021-11-05 MED ORDER — LORAZEPAM 2 MG/ML IJ SOLN
1.0000 mg | Freq: Once | INTRAMUSCULAR | Status: AC
  Administered 2021-11-05: 1 mg via INTRAVENOUS
  Filled 2021-11-05: qty 1

## 2021-11-05 MED ORDER — SODIUM CHLORIDE 0.9% FLUSH
3.0000 mL | Freq: Once | INTRAVENOUS | Status: AC
  Administered 2021-11-05: 3 mL via INTRAVENOUS

## 2021-11-05 NOTE — ED Provider Notes (Signed)
I was called into triage to evaluate patient.  She reports 17-hour history of right-sided numbness including her upper and lower extremity.  Reports that she has had difficulty walking and feeling as though she is going to fall but denies any dizziness or lightheadedness.  She denies any significant pain.  Does have a history of degenerative disc disease but states that this is a new symptom that has only recently developed.  Neurological exam was normal in triage but discussed that given new onset neurological findings that she would likely need imaging and we do not have available in urgent care.  Daughter is with her and agrees to take her to [facility] emergency room.  Vitals were stable at time of discharge.   Jeani Hawking, PA-C 11/05/21 1719

## 2021-11-05 NOTE — ED Triage Notes (Signed)
Pt reports waking up after 12AM with numbness in leg.Pt ambulatory in triage with out help. Pt also has numbness Rt arm.

## 2021-11-05 NOTE — ED Provider Notes (Signed)
[place] EMERGENCY DEPARTMENT Provider Note   CSN: [id number] Arrival date & time: 11/05/21  1727     History  Chief Complaint  Patient presents with   Numbness    [name] is a 88 y.o. female.  She has a history of hypertension cardiac disease and arthritis.  She said she fell asleep in her recliner last evening watching TV.  About midnight she woke up and thought her right leg was numb but was able to go off to bed.  When she woke up around 6 AM she noticed she had some numbness in her right face right arm right leg.  Symptoms to be somewhat improved from 6 AM although not completely resolved.  She is never had this before.  No double vision blurry vision weakness or unsteady gait.  She does have some chronic neck and right shoulder issues which cause her pain.  Her blood pressure is also been elevated recently.  The history is provided by the patient and a relative.  Cerebrovascular Accident This is a new problem. The current episode started 6 to 12 hours ago. The problem occurs constantly. The problem has been gradually improving. Pertinent negatives include no chest pain, no abdominal pain, no headaches and no shortness of breath. Nothing aggravates the symptoms. Nothing relieves the symptoms. She has tried nothing for the symptoms. The treatment provided mild relief.      Home Medications Prior to Admission medications   Medication Sig Start Date End Date Taking? Authorizing Provider  amLODipine (NORVASC) 5 MG tablet TAKE 1 TABLET BY MOUTH EVERY DAY 06/13/21   Satira Sark, MD  aspirin 81 MG tablet Take 81 mg by mouth daily.    [provider]  cyclobenzaprine (FLEXERIL) 5 MG tablet Take 2 tablets (10 mg total) by mouth 2 (two) times daily as needed for up to 10 days for muscle spasms. 10/28/21 11/07/21  Marcello Fennel, PA-C  fluticasone (FLONASE) 50 MCG/ACT nasal spray Place 2 sprays into both nostrils daily. 01/28/21   Wurst, [place],  PA-C  furosemide (LASIX) 40 MG tablet TAKE 1 TABLET BY MOUTH DAILY AS NEEDED FOR EDEMA Patient taking differently: Take 40 mg by mouth daily as needed for edema. 10/10/15   Troy Sine, MD  hydrALAZINE (APRESOLINE) 50 MG tablet TAKE 1 TABLET BY MOUTH TWICE A DAY 04/13/20   Satira Sark, MD  HYDROcodone-acetaminophen (NORCO/VICODIN) 5-325 MG tablet Take 1 tablet by mouth every 6 (six) hours as needed. 10/26/21   Ripley Fraise, MD  losartan (COZAAR) 50 MG tablet TAKE 1 TABLET BY MOUTH EVERY DAY 01/24/21   Satira Sark, MD  nitroGLYCERIN (NITROSTAT) 0.4 MG SL tablet PLACE 1 TABLET UNDER THE TONGUE EVERY 5 MINUTES AS NEEDED. 10/26/21   Satira Sark, MD  pantoprazole (PROTONIX) 40 MG tablet TAKE 1 TABLET BY MOUTH EVERY DAY 10/26/21   Satira Sark, MD  rosuvastatin (CRESTOR) 40 MG tablet TAKE 1 TABLET BY MOUTH EVERY DAY 10/03/21   Satira Sark, MD  Vitamin D, Ergocalciferol, (DRISDOL) 50000 units CAPS capsule Take 50,000 Units by mouth every 30 (thirty) days. Patient not taking: Reported on 04/28/2021 11/07/16   [provider]      Allergies    Atorvastatin, Contrast media [iodinated contrast media], Darvon [propoxyphene], and Codeine    Review of Systems   Review of Systems  Constitutional:  Negative for fever.  Eyes:  Negative for visual disturbance.  Respiratory:  Negative for shortness of  breath.   Cardiovascular:  Negative for chest pain.  Gastrointestinal:  Negative for abdominal pain.  Musculoskeletal:  Positive for neck pain.  Neurological:  Positive for numbness. Negative for speech difficulty, weakness and headaches.   Physical Exam Updated Vital Signs BP (!) 174/81 (BP Location: Left Arm)    Pulse 84    Temp 98.8 F (37.1 C) (Oral)    Resp 16    SpO2 96%  Physical Exam Vitals and nursing note reviewed.  Constitutional:      General: She is not in acute distress.    Appearance: Normal appearance. She is well-developed.  HENT:     Head:  Normocephalic and atraumatic.  Eyes:     Conjunctiva/sclera: Conjunctivae normal.  Cardiovascular:     Rate and Rhythm: Normal rate and regular rhythm.     Heart sounds: No murmur heard. Pulmonary:     Effort: Pulmonary effort is normal. No respiratory distress.     Breath sounds: Normal breath sounds.  Abdominal:     Palpations: Abdomen is soft.     Tenderness: There is no abdominal tenderness. There is no guarding or rebound.  Musculoskeletal:        General: Tenderness present. No swelling.     Cervical back: Neck supple.     Comments: Right shoulder tenderness  Skin:    General: Skin is warm and dry.     Capillary Refill: Capillary refill takes less than 2 seconds.  Neurological:     Mental Status: She is alert.     Cranial Nerves: No cranial nerve deficit.     Sensory: Sensory deficit present.     Motor: No weakness.     Comments: She has subjective decrease sensation in her right arm and right leg.  Strength appears preserved although right arm is difficult to tell because of her prior shoulder issues.  Normal facial sensation and no facial droop or dysarthria.  Psychiatric:        Mood and Affect: Mood normal.    ED Results / Procedures / Treatments   Labs (all labs ordered are listed, but only abnormal results are displayed) Labs Reviewed  COMPREHENSIVE METABOLIC PANEL - Abnormal; Notable for the following components:      Result Value   Glucose, Bld 120 (*)    Creatinine, Ser 1.27 (*)    Albumin 3.1 (*)    GFR, Estimated 41 (*)    All other components within normal limits  CREATININE, SERUM - Abnormal; Notable for the following components:   Creatinine, Ser 1.23 (*)    GFR, Estimated 43 (*)    All other components within normal limits  I-STAT CHEM 8, ED - Abnormal; Notable for the following components:   Creatinine, Ser 1.20 (*)    Glucose, Bld 115 (*)    All other components within normal limits  RESP PANEL BY RT-PCR (FLU A&B, COVID) [id number]  PROTIME-INR   APTT  CBC  DIFFERENTIAL  HEMOGLOBIN A1C  LIPID PANEL  CBC  CBG MONITORING, ED    EKG None  Radiology CT HEAD WO CONTRAST  Result Date: 11/05/2021 CLINICAL DATA:  Neuro deficit, acute, stroke suspected. Right side numbness EXAM: CT HEAD WITHOUT CONTRAST TECHNIQUE: Contiguous axial images were obtained from the base of the skull through the vertex without intravenous contrast. RADIATION DOSE REDUCTION: This exam was performed according to the departmental dose-optimization program which includes automated exposure control, adjustment of the mA and/or kV according to patient size and/or use of iterative reconstruction  technique. COMPARISON:  None. FINDINGS: Brain: There is atrophy and chronic small vessel disease changes. No acute intracranial abnormality. Specifically, no hemorrhage, hydrocephalus, mass lesion, acute infarction, or significant intracranial injury. Vascular: No hyperdense vessel or unexpected calcification. Skull: No acute calvarial abnormality. Sinuses/Orbits: No acute findings Other: None IMPRESSION: Atrophy, chronic microvascular disease. No acute intracranial abnormality. Electronically Signed   By: Rolm Baptise M.D.   On: 11/05/2021 18:50   MR ANGIO HEAD WO CONTRAST  Result Date: 11/06/2021 CLINICAL DATA:  Stroke follow-up EXAM: MRA HEAD WITHOUT CONTRAST TECHNIQUE: Angiographic images of the Circle of Willis were acquired using MRA technique without intravenous contrast. COMPARISON:  Brain MRI from earlier today FINDINGS: Anterior circulation: Motion degraded. Atheromatous type irregularity of the carotid siphons without suspected flow limiting stenosis. Motion artifact especially affects the MCA bifurcation and proximal A2 segments, levels not assessed. No gross obstructive stenosis or aneurysm. Posterior circulation: Vertebral, basilar, and posteroinferior cerebral arteries are patent and smoothly contoured except for the basilar is distorted by artifact. Smoothly contoured  and diffusely patent posterior cerebral arteries. There is likely some atherosclerosis of medium vessels, accentuated by motion. IMPRESSION: No emergent finding when allowing for moderate artifact. No proximal flow limiting stenosis not explained by motion artifact. Electronically Signed   By: Jorje Guild M.D.   On: 11/06/2021 06:07   MR BRAIN WO CONTRAST  Result Date: 11/06/2021 CLINICAL DATA:  Neuro deficit, acute, stroke suspected r sided numbness/tingling. EXAM: MRI HEAD WITHOUT CONTRAST TECHNIQUE: Multiplanar, multiecho pulse sequences of the brain and surrounding structures were obtained without intravenous contrast. COMPARISON:  None. FINDINGS: Brain: Small acute infarct of the dorsal left pons. No acute or chronic hemorrhage. There is multifocal hyperintense T2-weighted signal within the white matter. Generalized volume loss without a clear lobar predilection. The midline structures are normal. Vascular: Major flow voids are preserved. Skull and upper cervical spine: Normal calvarium and skull base. Visualized upper cervical spine and soft tissues are normal. Sinuses/Orbits:No paranasal sinus fluid levels or advanced mucosal thickening. No mastoid or middle ear effusion. Normal orbits. IMPRESSION: 1. Small acute infarct of the dorsal left pons. No hemorrhage or mass effect. 2. Findings of chronic small vessel ischemic disease and generalized volume loss. Electronically Signed   By: Ulyses Jarred M.D.   On: 11/06/2021 00:56   VAS US CAROTID (at [facility] and [facility] only)  Result Date: 11/06/2021 Carotid Arterial Duplex Study Patient Name:  Ellysia ARNESIA EYESTONE  Date of Exam:   11/06/2021 Medical Rec #: [id number]       Accession #:    [id number] Date of Birth: 1934/07/02       Patient Gender: F Patient Age:   8 years Exam Location:  [facility] Procedure:      VAS US CAROTID Referring Phys: Eugenie Norrie --------------------------------------------------------------------------------  Indications:       CVA.  Risk Factors:      Hypertension, hyperlipidemia, coronary artery disease. Comparison Study:  no prior Performing Technologist: Archie Patten RVS  Examination Guidelines: A complete evaluation includes B-mode imaging, spectral Doppler, color Doppler, and power Doppler as needed of all accessible portions of each vessel. Bilateral testing is considered an integral part of a complete examination. Limited examinations for reoccurring indications may be performed as noted.  Right Carotid Findings: +----------+--------+--------+--------+------------------+--------+             PSV cm/s EDV cm/s Stenosis Plaque Description Comments  +----------+--------+--------+--------+------------------+--------+  CCA Prox   76       7  heterogenous                 +----------+--------+--------+--------+------------------+--------+  CCA Distal 46       7                 heterogenous                 +----------+--------+--------+--------+------------------+--------+  ICA Prox   70       21       1-39%    heterogenous                 +----------+--------+--------+--------+------------------+--------+  ICA Distal 86       20                                             +----------+--------+--------+--------+------------------+--------+  ECA        88                                                      +----------+--------+--------+--------+------------------+--------+ +----------+--------+-------+--------+-------------------+             PSV cm/s EDV cms Describe Arm Pressure (mmHG)  +----------+--------+-------+--------+-------------------+  Subclavian 67                                             +----------+--------+-------+--------+-------------------+ +---------+--------+--+--------+--+---------+  Vertebral PSV cm/s 59 EDV cm/s 11 Antegrade  +---------+--------+--+--------+--+---------+  Left Carotid Findings: +----------+--------+--------+--------+------------------+--------+             PSV cm/s EDV  cm/s Stenosis Plaque Description Comments  +----------+--------+--------+--------+------------------+--------+  CCA Prox   78       15                heterogenous                 +----------+--------+--------+--------+------------------+--------+  CCA Distal 54       9                 heterogenous                 +----------+--------+--------+--------+------------------+--------+  ICA Prox   58       17       1-39%    heterogenous                 +----------+--------+--------+--------+------------------+--------+  ICA Distal 59       16                                             +----------+--------+--------+--------+------------------+--------+  ECA        78                                                      +----------+--------+--------+--------+------------------+--------+ +----------+--------+--------+--------+-------------------+  PSV cm/s EDV cm/s Describe Arm Pressure (mmHG)  +----------+--------+--------+--------+-------------------+  Subclavian 122                                             +----------+--------+--------+--------+-------------------+ +---------+--------+--+--------+--+---------+  Vertebral PSV cm/s 40 EDV cm/s 11 Antegrade  +---------+--------+--+--------+--+---------+   Summary: Right Carotid: Velocities in the right ICA are consistent with a 1-39% stenosis. Left Carotid: Velocities in the left ICA are consistent with a 1-39% stenosis. Vertebrals: Bilateral vertebral arteries demonstrate antegrade flow. *See table(s) above for measurements and observations.     Preliminary     Procedures Procedures    Medications Ordered in ED Medications   stroke: mapping our early stages of recovery book (has no administration in time range)  0.9 %  sodium chloride infusion ( Intravenous New Bag/Given 11/06/21 0323)  acetaminophen (TYLENOL) tablet 650 mg (has no administration in time range)    Or  acetaminophen (TYLENOL) 160 MG/5ML solution 650 mg (has no administration in time  range)    Or  acetaminophen (TYLENOL) suppository 650 mg (has no administration in time range)  senna-docusate (Senokot-S) tablet 1 tablet (has no administration in time range)  enoxaparin (LOVENOX) injection 40 mg (has no administration in time range)  magnesium hydroxide (MILK OF MAGNESIA) suspension 30 mL (has no administration in time range)  traZODone (DESYREL) tablet 25 mg (has no administration in time range)  amLODipine (NORVASC) tablet 5 mg (has no administration in time range)  hydrALAZINE (APRESOLINE) tablet 50 mg (has no administration in time range)  losartan (COZAAR) tablet 50 mg (has no administration in time range)  nitroGLYCERIN (NITROSTAT) SL tablet 0.4 mg (has no administration in time range)  rosuvastatin (CRESTOR) tablet 40 mg (has no administration in time range)  pantoprazole (PROTONIX) EC tablet 40 mg (has no administration in time range)  cyclobenzaprine (FLEXERIL) tablet 10 mg (has no administration in time range)  fluticasone (FLONASE) 50 MCG/ACT nasal spray 2 spray (has no administration in time range)  aspirin EC tablet 81 mg (has no administration in time range)  clopidogrel (PLAVIX) tablet 75 mg (has no administration in time range)  sodium chloride flush (NS) 0.9 % injection 3 mL (3 mLs Intravenous Given 11/05/21 2336)  LORazepam (ATIVAN) injection 1 mg (1 mg Intravenous Given 11/05/21 2331)  LORazepam (ATIVAN) injection 1 mg (1 mg Intravenous Given 11/06/21 0437)    ED Course/ Medical Decision Making/ A&P Clinical Course as of 11/06/21 [id number]  [facility] Nov 05, 2021  1954 Reviewed case with Dr. Rory Percy neurology.  He is recommending an MRI.  If MRI positive will need admission.  MRI negative can follow-up outpatient. [MB]  Mon Nov 06, 2021  0955 EKG not crossing into epic -normal sinus rhythm right bundle branch block no acute changes from prior EKG 4 days prior. [MB]    Clinical Course User Index [MB] Hayden Rasmussen, MD                           Medical  Decision Making Amount and/or Complexity of Data Reviewed Labs: ordered. Radiology: ordered.  Risk Prescription drug management. Decision regarding hospitalization.  This patient complains of right-sided numbness; this involves an extensive number of treatment Options and is a complaint that carries with it a high risk of complications and morbidity. The differential includes stroke, bleed, metabolic derangement, hypoglycemia, peripheral  nerve  I ordered, reviewed and interpreted labs, which included CBC with normal white count normal hemoglobin, chemistries fairly normal other than mildly elevated glucose and creatinine, COVID and flu negative I ordered medication IV Ativan for MRI and reviewed PMP when indicated. I ordered imaging studies which included CT head and I independently    visualized and interpreted imaging which showed no acute findings.  MRI pending at time of signout. Additional history obtained from patient's family members Previous records obtained and reviewed in epic, recently in the ED for neck and shoulder pain I consulted neurology Dr. Rory Percy and discussed lab and imaging findings and discussed disposition.  Cardiac monitoring reviewed, normal sinus rhythm Social determinants considered, no significant barriers Critical Interventions: None  After the interventions stated above, I reevaluated the patient and found patient still to have subjective decrease sensation on right side Admission and further testing considered, patient is pending MRI brain.  If MRI positive reconnect with neurology for recommendations.  If MRI negative can follow-up outpatient with neurology.          Final Clinical Impression(s) / ED Diagnoses Final diagnoses:  Cerebrovascular accident (CVA), unspecified mechanism ([place])    Rx / [place] Orders ED Discharge Orders     None         Hayden Rasmussen, MD 11/06/21 [phone]

## 2021-11-05 NOTE — ED Triage Notes (Signed)
Pt reports R sided numbness (arm and leg).  States she went to sleep in chair watching gospel around 8:30-9:30pm last night.  Woke up with numbness around midnight.   R arm drift but pt reports R shoulder/neck pain with previous weakness.  States the numbness is new.  VAN negative.

## 2021-11-06 ENCOUNTER — Inpatient Hospital Stay (HOSPITAL_COMMUNITY): Payer: Medicare Other

## 2021-11-06 DIAGNOSIS — I129 Hypertensive chronic kidney disease with stage 1 through stage 4 chronic kidney disease, or unspecified chronic kidney disease: Secondary | ICD-10-CM | POA: Diagnosis present

## 2021-11-06 DIAGNOSIS — I639 Cerebral infarction, unspecified: Secondary | ICD-10-CM

## 2021-11-06 DIAGNOSIS — Z66 Do not resuscitate: Secondary | ICD-10-CM | POA: Diagnosis present

## 2021-11-06 DIAGNOSIS — M1990 Unspecified osteoarthritis, unspecified site: Secondary | ICD-10-CM | POA: Diagnosis present

## 2021-11-06 DIAGNOSIS — E669 Obesity, unspecified: Secondary | ICD-10-CM | POA: Diagnosis present

## 2021-11-06 DIAGNOSIS — N1832 Chronic kidney disease, stage 3b: Secondary | ICD-10-CM | POA: Diagnosis present

## 2021-11-06 DIAGNOSIS — E785 Hyperlipidemia, unspecified: Secondary | ICD-10-CM | POA: Diagnosis not present

## 2021-11-06 DIAGNOSIS — M25511 Pain in right shoulder: Secondary | ICD-10-CM | POA: Diagnosis present

## 2021-11-06 DIAGNOSIS — K219 Gastro-esophageal reflux disease without esophagitis: Secondary | ICD-10-CM | POA: Diagnosis present

## 2021-11-06 DIAGNOSIS — R29703 NIHSS score 3: Secondary | ICD-10-CM | POA: Diagnosis not present

## 2021-11-06 DIAGNOSIS — R202 Paresthesia of skin: Secondary | ICD-10-CM | POA: Diagnosis not present

## 2021-11-06 DIAGNOSIS — I6389 Other cerebral infarction: Secondary | ICD-10-CM

## 2021-11-06 DIAGNOSIS — I252 Old myocardial infarction: Secondary | ICD-10-CM | POA: Diagnosis not present

## 2021-11-06 DIAGNOSIS — I2510 Atherosclerotic heart disease of native coronary artery without angina pectoris: Secondary | ICD-10-CM

## 2021-11-06 DIAGNOSIS — R200 Anesthesia of skin: Secondary | ICD-10-CM | POA: Diagnosis present

## 2021-11-06 DIAGNOSIS — E782 Mixed hyperlipidemia: Secondary | ICD-10-CM | POA: Diagnosis present

## 2021-11-06 DIAGNOSIS — G8191 Hemiplegia, unspecified affecting right dominant side: Secondary | ICD-10-CM | POA: Diagnosis present

## 2021-11-06 DIAGNOSIS — G8929 Other chronic pain: Secondary | ICD-10-CM | POA: Diagnosis present

## 2021-11-06 DIAGNOSIS — Z9861 Coronary angioplasty status: Secondary | ICD-10-CM

## 2021-11-06 DIAGNOSIS — Z955 Presence of coronary angioplasty implant and graft: Secondary | ICD-10-CM | POA: Diagnosis not present

## 2021-11-06 DIAGNOSIS — I10 Essential (primary) hypertension: Secondary | ICD-10-CM

## 2021-11-06 DIAGNOSIS — Z9049 Acquired absence of other specified parts of digestive tract: Secondary | ICD-10-CM | POA: Diagnosis not present

## 2021-11-06 DIAGNOSIS — R29700 NIHSS score 0: Secondary | ICD-10-CM | POA: Diagnosis present

## 2021-11-06 DIAGNOSIS — R29706 NIHSS score 6: Secondary | ICD-10-CM | POA: Diagnosis not present

## 2021-11-06 DIAGNOSIS — I6329 Cerebral infarction due to unspecified occlusion or stenosis of other precerebral arteries: Secondary | ICD-10-CM | POA: Diagnosis present

## 2021-11-06 DIAGNOSIS — G4733 Obstructive sleep apnea (adult) (pediatric): Secondary | ICD-10-CM | POA: Diagnosis present

## 2021-11-06 DIAGNOSIS — I452 Bifascicular block: Secondary | ICD-10-CM | POA: Diagnosis present

## 2021-11-06 DIAGNOSIS — Z8249 Family history of ischemic heart disease and other diseases of the circulatory system: Secondary | ICD-10-CM | POA: Diagnosis not present

## 2021-11-06 DIAGNOSIS — Z6831 Body mass index (BMI) 31.0-31.9, adult: Secondary | ICD-10-CM | POA: Diagnosis not present

## 2021-11-06 DIAGNOSIS — Z20822 Contact with and (suspected) exposure to covid-19: Secondary | ICD-10-CM | POA: Diagnosis present

## 2021-11-06 LAB — LIPID PANEL
Cholesterol: 132 mg/dL (ref 0–200)
HDL: 50 mg/dL (ref >40)
LDL Cholesterol: 67 mg/dL (ref 0–99)
Total CHOL/HDL Ratio: 2.6 RATIO
Triglycerides: 77 mg/dL (ref <150)
VLDL: 15 mg/dL (ref 0–40)

## 2021-11-06 LAB — CBC
HCT: 37.5 % (ref 36.0–46.0)
Hemoglobin: 12.4 g/dL (ref 12.0–15.0)
MCH: 29.6 pg (ref 26.0–34.0)
MCHC: 33.1 g/dL (ref 30.0–36.0)
MCV: 89.5 fL (ref 80.0–100.0)
Platelets: 155 10*3/uL (ref 150–400)
RBC: 4.19 MIL/uL (ref 3.87–5.11)
RDW: 15.4 % (ref 11.5–15.5)
WBC: 4.8 10*3/uL (ref 4.0–10.5)
nRBC: 0 % (ref 0.0–0.2)

## 2021-11-06 LAB — HEMOGLOBIN A1C
Hgb A1c MFr Bld: 5.4 % (ref 4.8–5.6)
Mean Plasma Glucose: 108.28 mg/dL

## 2021-11-06 LAB — CREATININE, SERUM
Creatinine, Ser: 1.23 mg/dL — ABNORMAL HIGH (ref 0.44–1.00)
GFR, Estimated: 43 mL/min — ABNORMAL LOW (ref >60)

## 2021-11-06 LAB — ECHOCARDIOGRAM COMPLETE BUBBLE STUDY
AV Vena cont: 0.2 cm
Area-P 1/2: 4.21 cm2
Calc EF: 74.7 %
P 1/2 time: 404 msec
S' Lateral: 2.3 cm
Single Plane A2C EF: 70.1 %
Single Plane A4C EF: 79.1 %

## 2021-11-06 LAB — RESP PANEL BY RT-PCR (FLU A&B, COVID) ARPGX2
Influenza A by PCR: NEGATIVE
Influenza B by PCR: NEGATIVE
SARS Coronavirus 2 by RT PCR: NEGATIVE

## 2021-11-06 MED ORDER — PANTOPRAZOLE SODIUM 40 MG PO TBEC
40.0000 mg | DELAYED_RELEASE_TABLET | Freq: Every day | ORAL | Status: DC
  Administered 2021-11-06 – 2021-11-07 (×2): 40 mg via ORAL
  Filled 2021-11-06 (×2): qty 1

## 2021-11-06 MED ORDER — STROKE: EARLY STAGES OF RECOVERY BOOK
Freq: Once | Status: AC
  Filled 2021-11-06: qty 1

## 2021-11-06 MED ORDER — NITROGLYCERIN 0.4 MG SL SUBL: 0.4000 mg | SUBLINGUAL_TABLET | SUBLINGUAL | Status: DC | PRN

## 2021-11-06 MED ORDER — SENNOSIDES-DOCUSATE SODIUM 8.6-50 MG PO TABS: 1.0000 | ORAL_TABLET | Freq: Every evening | ORAL | Status: DC | PRN

## 2021-11-06 MED ORDER — FLUTICASONE PROPIONATE 50 MCG/ACT NA SUSP
2.0000 | Freq: Every day | NASAL | Status: DC
  Filled 2021-11-06: qty 16

## 2021-11-06 MED ORDER — AMLODIPINE BESYLATE 5 MG PO TABS
5.0000 mg | ORAL_TABLET | Freq: Every day | ORAL | Status: DC
  Administered 2021-11-06 – 2021-11-07 (×2): 5 mg via ORAL
  Filled 2021-11-06 (×2): qty 1

## 2021-11-06 MED ORDER — ACETAMINOPHEN 325 MG PO TABS: 650.0000 mg | ORAL_TABLET | ORAL | Status: DC | PRN

## 2021-11-06 MED ORDER — ACETAMINOPHEN 160 MG/5ML PO SOLN: 650.0000 mg | ORAL | Status: DC | PRN

## 2021-11-06 MED ORDER — ENOXAPARIN SODIUM 40 MG/0.4ML IJ SOSY
40.0000 mg | PREFILLED_SYRINGE | INTRAMUSCULAR | Status: DC
  Administered 2021-11-06 – 2021-11-07 (×2): 40 mg via SUBCUTANEOUS
  Filled 2021-11-06 (×2): qty 0.4

## 2021-11-06 MED ORDER — LORAZEPAM 2 MG/ML IJ SOLN: 0.5000 mg | Freq: Once | INTRAMUSCULAR | Status: DC

## 2021-11-06 MED ORDER — TRAZODONE HCL 50 MG PO TABS
25.0000 mg | ORAL_TABLET | Freq: Every evening | ORAL | Status: DC | PRN
Start: 2021-11-06 — End: 2021-11-07

## 2021-11-06 MED ORDER — SODIUM CHLORIDE 0.9 % IV SOLN: INTRAVENOUS | Status: DC

## 2021-11-06 MED ORDER — MAGNESIUM HYDROXIDE 400 MG/5ML PO SUSP
30.0000 mL | Freq: Every day | ORAL | Status: DC | PRN
  Filled 2021-11-06: qty 30

## 2021-11-06 MED ORDER — CLOPIDOGREL BISULFATE 75 MG PO TABS
75.0000 mg | ORAL_TABLET | Freq: Every day | ORAL | Status: DC
Start: 2021-11-06 — End: 2021-11-07
  Administered 2021-11-06 – 2021-11-07 (×2): 75 mg via ORAL
  Filled 2021-11-06 (×2): qty 1

## 2021-11-06 MED ORDER — ACETAMINOPHEN 650 MG RE SUPP: 650.0000 mg | RECTAL | Status: DC | PRN

## 2021-11-06 MED ORDER — LOSARTAN POTASSIUM 50 MG PO TABS
50.0000 mg | ORAL_TABLET | Freq: Every day | ORAL | Status: DC
  Administered 2021-11-06 – 2021-11-07 (×2): 50 mg via ORAL
  Filled 2021-11-06 (×2): qty 1

## 2021-11-06 MED ORDER — HYDRALAZINE HCL 50 MG PO TABS
50.0000 mg | ORAL_TABLET | Freq: Two times a day (BID) | ORAL | Status: DC
  Administered 2021-11-06 – 2021-11-07 (×3): 50 mg via ORAL
  Filled 2021-11-06 (×3): qty 1

## 2021-11-06 MED ORDER — ROSUVASTATIN CALCIUM 20 MG PO TABS
40.0000 mg | ORAL_TABLET | Freq: Every day | ORAL | Status: DC
  Administered 2021-11-06 – 2021-11-07 (×2): 40 mg via ORAL
  Filled 2021-11-06 (×2): qty 2

## 2021-11-06 MED ORDER — FUROSEMIDE 20 MG PO TABS: 40.0000 mg | ORAL_TABLET | Freq: Every day | ORAL | Status: DC | PRN

## 2021-11-06 MED ORDER — ASPIRIN EC 81 MG PO TBEC
81.0000 mg | DELAYED_RELEASE_TABLET | Freq: Every day | ORAL | Status: DC
  Administered 2021-11-06 – 2021-11-07 (×2): 81 mg via ORAL
  Filled 2021-11-06 (×2): qty 1

## 2021-11-06 MED ORDER — LORAZEPAM 2 MG/ML IJ SOLN
1.0000 mg | Freq: Once | INTRAMUSCULAR | Status: AC
  Administered 2021-11-06: 1 mg via INTRAVENOUS
  Filled 2021-11-06: qty 1

## 2021-11-06 MED ORDER — CYCLOBENZAPRINE HCL 10 MG PO TABS: 10.0000 mg | ORAL_TABLET | Freq: Two times a day (BID) | ORAL | Status: DC | PRN

## 2021-11-06 NOTE — ED Notes (Signed)
Pt asleep due to medications,unable to complete accurate NIH

## 2021-11-06 NOTE — ED Notes (Signed)
To vasc lab

## 2021-11-06 NOTE — ED Notes (Signed)
Spoke with the nurse for the floor and they are ready for the patient

## 2021-11-06 NOTE — ED Notes (Signed)
Patient transported to MRI 

## 2021-11-06 NOTE — H&P (Signed)
[place]   PATIENT NAME: [name]    MR#:  [id number]  DATE OF BIRTH:  30-May-1934  DATE OF ADMISSION:  11/05/2021  PRIMARY CARE PHYSICIAN: Cory Munch, PA-C   Patient is coming from: Home  REQUESTING/REFERRING PHYSICIAN: [place], April, MD  CHIEF COMPLAINT:   Chief Complaint  Patient presents with   Numbness    HISTORY OF PRESENT ILLNESS:  [name] is a 88 y.o. African-American female with medical history significant for coronary artery disease status post PCI and stent, hypertension, dyslipidemia, GERD, OSA, CKD stage IIIb and osteoarthritis, who presented to the emergency room with acute onset of right foot numbness on her bed around midnight but was able to get off of her bed.  She went to sleep at 10 PM.  When she woke up at 6 AM she felt numbness in her right face as well as right arm, right leg and foot.  Her symptoms have been improving some since 6 AM but not resolved.  She denies any muscle weakness, dysarthria or dysphagia.  No tinnitus or vertigo.  She denied any ataxia and no diplopia.  No urinary or stool incontinence.  No chest pain or palpitations.  She has chronic neck and right shoulder pain.  No fever or chills.  She was seen in the ER for neck and shoulder pain on Saturday 2/18 and placed on steroids and muscle relaxants.  She was also seen on 2/16 for elevated BP that was managed.  ED Course: When she came to the ER, blood pressure was 216/89 with heart rate of 57 with otherwise normal vital signs.  BP later on was 141/69.  Labs revealed a creatinine of 1.2 with otherwise unremarkable CMP.  CBC was within normal. EKG as reviewed by me : Showed normal sinus rhythm with rate of 80 with right bundle branch block and left intrafascicular block (bifascicular block). Imaging: Noncontrast CT scan showed no acute intracranial normalities.  It showed atrophy and chronic microvascular disease.  Brain MRI without contrast revealed small acute infarction of  the dorsal left pons with no hemorrhage or mass effect.  It showed findings of chronic small vessel ischemic disease and generalized volume loss.  The patient was given 1 mg of IV Ativan.  She will be admitted to a medical telemetry bed for further evaluation and management.   PAST MEDICAL HISTORY:   Past Medical History:  Diagnosis Date   Arthritis    CKD (chronic kidney disease), stage III ([place])    Complete heart block ([place]) 11/16/2016   a. transient during NSTEMI, resolved with revascularization.   Coronary artery disease 11/2009   a. with prior stent placement to the ramus intermedius and RCA. b. NSTEMI 11/2016 s/p DES to DES to distal Cx with moderate residual dz.   Essential hypertension    Mixed hyperlipidemia    Non-ST elevation (NSTEMI) myocardial infarction ([place]) 11/16/2016   Obesity    Reflux esophagitis    Sleep apnea 09/27/2010   Untreated, REM 64.7/hr AHI 18.5/hr RDI 19.2/hr. Patuent refused CPAP therapy  -Stage IIIb chronic kidney disease.  PAST SURGICAL HISTORY:   Past Surgical History:  Procedure Laterality Date   CHOLECYSTECTOMY     CORONARY ANGIOPLASTY WITH STENT PLACEMENT  2007   A 3.0x109mm CYPHER stent post dilated to 3.29 mm the 100% occlusion was reduced to 0%   CORONARY STENT INTERVENTION N/A 11/16/2016   Procedure: Coronary Stent Intervention;  Surgeon: Sherren Mocha, MD;  Location: [facility]  INVASIVE CV LAB;  Service: Cardiovascular;  Laterality: N/A;   LEFT HEART CATH AND CORONARY ANGIOGRAPHY N/A 11/16/2016   Procedure: Left Heart Cath and Coronary Angiography;  Surgeon: Sherren Mocha, MD;  Location: [facility] CV LAB;  Service: Cardiovascular;  Laterality: N/A;   LEFT HEART CATHETERIZATION WITH CORONARY ANGIOGRAM N/A 07/12/2014   Procedure: LEFT HEART CATHETERIZATION WITH CORONARY ANGIOGRAM;  Surgeon: Blane Ohara, MD;  Location: [facility] CATH LAB;  Service: Cardiovascular;  Laterality: N/A;   TEMPORARY PACEMAKER N/A 11/16/2016   Procedure: Temporary Pacemaker;   Surgeon: Sherren Mocha, MD;  Location: [place] CV LAB;  Service: Cardiovascular;  Laterality: N/A;    SOCIAL HISTORY:   Social History   Tobacco Use   Smoking status: Never   Smokeless tobacco: Never  Substance Use Topics   Alcohol use: No    FAMILY HISTORY:   Family History  Problem Relation Age of Onset   Hypertension Other     DRUG ALLERGIES:   Allergies  Allergen Reactions   Atorvastatin Nausea And Vomiting   Contrast Media [Iodinated Contrast Media] Nausea And Vomiting   Darvon [Propoxyphene] Nausea And Vomiting   Codeine Nausea And Vomiting and Anxiety    REVIEW OF SYSTEMS:   ROS As per history of present illness. All pertinent systems were reviewed above. Constitutional, HEENT, cardiovascular, respiratory, GI, GU, musculoskeletal, neuro, psychiatric, endocrine, integumentary and hematologic systems were reviewed and are otherwise negative/unremarkable except for positive findings mentioned above in the HPI.   MEDICATIONS AT HOME:   Prior to Admission medications   Medication Sig Start Date End Date Taking? Authorizing Provider  amLODipine (NORVASC) 5 MG tablet TAKE 1 TABLET BY MOUTH EVERY DAY 06/13/21   Satira Sark, MD  aspirin 81 MG tablet Take 81 mg by mouth daily.    [provider]  cyclobenzaprine (FLEXERIL) 5 MG tablet Take 2 tablets (10 mg total) by mouth 2 (two) times daily as needed for up to 10 days for muscle spasms. 10/28/21 11/07/21  Marcello Fennel, PA-C  fluticasone (FLONASE) 50 MCG/ACT nasal spray Place 2 sprays into both nostrils daily. 01/28/21   Wurst, [place], PA-C  furosemide (LASIX) 40 MG tablet TAKE 1 TABLET BY MOUTH DAILY AS NEEDED FOR EDEMA Patient taking differently: Take 40 mg by mouth daily as needed for edema. 10/10/15   Troy Sine, MD  hydrALAZINE (APRESOLINE) 50 MG tablet TAKE 1 TABLET BY MOUTH TWICE A DAY 04/13/20   Satira Sark, MD  HYDROcodone-acetaminophen (NORCO/VICODIN) 5-325 MG tablet Take 1  tablet by mouth every 6 (six) hours as needed. 10/26/21   Ripley Fraise, MD  losartan (COZAAR) 50 MG tablet TAKE 1 TABLET BY MOUTH EVERY DAY 01/24/21   Satira Sark, MD  nitroGLYCERIN (NITROSTAT) 0.4 MG SL tablet PLACE 1 TABLET UNDER THE TONGUE EVERY 5 MINUTES AS NEEDED. 10/26/21   Satira Sark, MD  pantoprazole (PROTONIX) 40 MG tablet TAKE 1 TABLET BY MOUTH EVERY DAY 10/26/21   Satira Sark, MD  rosuvastatin (CRESTOR) 40 MG tablet TAKE 1 TABLET BY MOUTH EVERY DAY 10/03/21   Satira Sark, MD  Vitamin D, Ergocalciferol, (DRISDOL) 50000 units CAPS capsule Take 50,000 Units by mouth every 30 (thirty) days. Patient not taking: Reported on 04/28/2021 11/07/16   [provider]      VITAL SIGNS:  Blood pressure (!) 168/75, pulse 62, temperature 98.8 F (37.1 C), temperature source Oral, resp. rate 19, SpO2 97 %.  PHYSICAL EXAMINATION:  Physical Exam  GENERAL:  88 y.o.-year-old African-American female patient lying in the bed with no acute distress.  She was fairly somnolent but arousable, alert and oriented x3 and cooperative.Marland Kitchen EYES: Pupils equal, round, reactive to light and accommodation. No scleral icterus. Extraocular muscles intact.  HEENT: Head atraumatic, normocephalic. Oropharynx and nasopharynx clear.  NECK:  Supple, no jugular venous distention. No thyroid enlargement, no tenderness.  LUNGS: Normal breath sounds bilaterally, no wheezing, rales,rhonchi or crepitation. No use of accessory muscles of respiration.  CARDIOVASCULAR: Regular rate and rhythm, S1, S2 normal. No murmurs, rubs, or gallops.  ABDOMEN: Soft, nondistended, nontender. Bowel sounds present. No organomegaly or mass.  EXTREMITIES: No pedal edema, cyanosis, or clubbing.  NEUROLOGIC: Cranial nerves II through XII are intact. Muscle strength 5/5 in all extremities.  She had mildly diminished sensation to light touch in right upper extremity with intact sensations to light touch throughout.  Gait  not checked.  PSYCHIATRIC: The patient is alert and oriented x 3.  Normal affect and good eye contact. SKIN: No obvious rash, lesion, or ulcer.   LABORATORY PANEL:   CBC Recent Labs  Lab 11/05/21 1742 11/05/21 1748  WBC 6.5  --   HGB 13.2 13.6  HCT 40.0 40.0  PLT 179  --    ------------------------------------------------------------------------------------------------------------------  Chemistries  Recent Labs  Lab 11/05/21 1742 11/05/21 1748  NA 138 141  K 4.1 4.1  CL 106 106  CO2 25  --   GLUCOSE 120* 115*  BUN 14 15  CREATININE 1.27* 1.20*  CALCIUM 9.2  --   AST 31  --   ALT 24  --   ALKPHOS 86  --   BILITOT 0.6  --    ------------------------------------------------------------------------------------------------------------------  Cardiac Enzymes No results for input(s): TROPONINI in the last 168 hours. ------------------------------------------------------------------------------------------------------------------  RADIOLOGY:  CT HEAD WO CONTRAST  Result Date: 11/05/2021 CLINICAL DATA:  Neuro deficit, acute, stroke suspected. Right side numbness EXAM: CT HEAD WITHOUT CONTRAST TECHNIQUE: Contiguous axial images were obtained from the base of the skull through the vertex without intravenous contrast. RADIATION DOSE REDUCTION: This exam was performed according to the departmental dose-optimization program which includes automated exposure control, adjustment of the mA and/or kV according to patient size and/or use of iterative reconstruction technique. COMPARISON:  None. FINDINGS: Brain: There is atrophy and chronic small vessel disease changes. No acute intracranial abnormality. Specifically, no hemorrhage, hydrocephalus, mass lesion, acute infarction, or significant intracranial injury. Vascular: No hyperdense vessel or unexpected calcification. Skull: No acute calvarial abnormality. Sinuses/Orbits: No acute findings Other: None IMPRESSION: Atrophy, chronic  microvascular disease. No acute intracranial abnormality. Electronically Signed   By: Rolm Baptise M.D.   On: 11/05/2021 18:50   MR BRAIN WO CONTRAST  Result Date: 11/06/2021 CLINICAL DATA:  Neuro deficit, acute, stroke suspected r sided numbness/tingling. EXAM: MRI HEAD WITHOUT CONTRAST TECHNIQUE: Multiplanar, multiecho pulse sequences of the brain and surrounding structures were obtained without intravenous contrast. COMPARISON:  None. FINDINGS: Brain: Small acute infarct of the dorsal left pons. No acute or chronic hemorrhage. There is multifocal hyperintense T2-weighted signal within the white matter. Generalized volume loss without a clear lobar predilection. The midline structures are normal. Vascular: Major flow voids are preserved. Skull and upper cervical spine: Normal calvarium and skull base. Visualized upper cervical spine and soft tissues are normal. Sinuses/Orbits:No paranasal sinus fluid levels or advanced mucosal thickening. No mastoid or middle ear effusion. Normal orbits. IMPRESSION: 1. Small acute infarct of the dorsal left pons. No hemorrhage or mass effect. 2. Findings of chronic  small vessel ischemic disease and generalized volume loss. Electronically Signed   By: Ulyses Jarred M.D.   On: 11/06/2021 00:56      IMPRESSION AND PLAN:  Assessment and Plan: * Acute CVA (cerebrovascular accident) ([place])- (present on admission) -This involves infarction of the left dorsal pons with subsequent right-sided numbness. - The patient will be admitted to a medical telemetry bed. - We will follow neurochecks every 4 hours for 24 hours. - We will continue the patient on aspirin and Plavix. - We will obtain a brain MRI with a of the head and bilateral carotid Doppler. - We will obtain 2D echo with bubble study. - Neurology consult was obtained by Dr. Rory Percy and recommendations are appreciated.  Essential hypertension- (present on admission) - We will continue antihypertensives with permissive  parameters.  Dyslipidemia - We will continue statin therapy.  GERD without esophagitis - We will continue PPI therapy.  CAD S/P PCI - We will continue aspirin, Cozaar and statin therapy as well as as needed sublingual nitroglycerin..   DVT prophylaxis: Lovenox. Advanced Care Planning:  Code Status: She wants to be DNR/DNI Family Communication:  The plan of care was discussed in details with the patient (and family). I answered all questions. The patient agreed to proceed with the above mentioned plan. Further management will depend upon hospital course. Disposition Plan: Back to previous home environment Consults called: Neurology.  All the records are reviewed and case discussed with ED provider.  Status is: Inpatient  At the time of the admission, it appears that the appropriate admission status for this patient is inpatient.  This is judged to be reasonable and necessary in order to provide the required intensity of service to ensure the patient's safety given the presenting symptoms, physical exam findings and initial radiographic and laboratory data in the context of comorbid conditions.  The patient requires inpatient status due to high intensity of service, high risk of further deterioration and high frequency of surveillance required.  I certify that at the time of admission, it is my clinical judgment that the patient will require inpatient hospital care extending more than 2 midnights.                            Dispo: The patient is from: Home              Anticipated d/c is to: Home              Patient currently is not medically stable to d/c.              Difficult to place patient: No  Christel Mormon M.D on 11/06/2021 at 3:39 AM  [facility]   From 7 PM-7 AM, contact night-coverage [email or web address]  CC: Primary care physician; Cory Munch, PA-C

## 2021-11-06 NOTE — Assessment & Plan Note (Signed)
-   We will continue PPI therapy 

## 2021-11-06 NOTE — Progress Notes (Signed)
Carotid duplex has been completed.   Preliminary results in CV Proc.   [name] 11/06/2021 9:27 AM

## 2021-11-06 NOTE — Assessment & Plan Note (Signed)
-   We will continue antihypertensives with permissive parameters. 

## 2021-11-06 NOTE — ED Notes (Signed)
Admitting notified that the pt passed her swallow screen

## 2021-11-06 NOTE — ED Notes (Signed)
Pt on personal phone with clear speech

## 2021-11-06 NOTE — Consult Note (Addendum)
Neurology Consultation  Reason for Consult: Stroke on MRI Referring Physician: Dr. Nicholes Stairs, EDP  CC: Right-sided numbness and weakness  History is obtained from: Chart, patient's daughter at bedside  HPI: [name] is a 88 y.o. female past medical history of CKD 3, coronary artery disease, hypertension, hyperlipidemia, obesity, sleep apnea not on CPAP, presented to the emergency room for evaluation of right-sided numbness and weakness. The patient was extremely sleepy as she had not slept all day and was not very cooperative with the history.  The daughter provided history and was the primary source of history obtained. Daughter reports she has been having trouble with his blood pressures for many days and trouble with the right shoulder 4 weeks for which she has been evaluated in the past. The reason for coming in today was last night around 10:30 PM she started noticing some numbness in her right arm as well as right leg.  She has some numbness in her arm secondary to the shoulder pain but this was different.  Her symptoms did not improve making her go to the urgent care and from there to the emergency room.  MRI of the brain was done which showed a pontine infarct.    LKW: 10:30 PM on 11/04/2021 tpa given?: no, outside the window Premorbid modified Rankin scale (mRS): 3  ROS: Full ROS was performed and is negative except as noted in the HPI.   Past Medical History:  Diagnosis Date   Arthritis    CKD (chronic kidney disease), stage III ([place])    Complete heart block ([place]) 11/16/2016   a. transient during NSTEMI, resolved with revascularization.   Coronary artery disease 11/2009   a. with prior stent placement to the ramus intermedius and RCA. b. NSTEMI 11/2016 s/p DES to DES to distal Cx with moderate residual dz.   Essential hypertension    Mixed hyperlipidemia    Non-ST elevation (NSTEMI) myocardial infarction ([place]) 11/16/2016   Obesity    Reflux esophagitis    Sleep apnea  09/27/2010   Untreated, REM 64.7/hr AHI 18.5/hr RDI 19.2/hr. Patuent refused CPAP therapy   Family History  Problem Relation Age of Onset   Hypertension Other     Social History:   reports that she has never smoked. She has never used smokeless tobacco. She reports that she does not drink alcohol and does not use drugs.  Medications  Current Facility-Administered Medications:     stroke: mapping our early stages of recovery book, , Does not apply, Once, Mansy, Jan A, MD   0.9 %  sodium chloride infusion, , Intravenous, Continuous, Mansy, Jan A, MD   acetaminophen (TYLENOL) tablet 650 mg, 650 mg, Oral, Q4H PRN **OR** acetaminophen (TYLENOL) 160 MG/5ML solution 650 mg, 650 mg, Per Tube, Q4H PRN **OR** acetaminophen (TYLENOL) suppository 650 mg, 650 mg, Rectal, Q4H PRN, Mansy, Jan A, MD   amLODipine (NORVASC) tablet 5 mg, 5 mg, Oral, Daily, Mansy, Jan A, MD   aspirin EC tablet 81 mg, 81 mg, Oral, Daily, Mansy, Jan A, MD   clopidogrel (PLAVIX) tablet 75 mg, 75 mg, Oral, Daily, Mansy, Jan A, MD   cyclobenzaprine (FLEXERIL) tablet 10 mg, 10 mg, Oral, BID PRN, Mansy, Jan A, MD   enoxaparin (LOVENOX) injection 40 mg, 40 mg, Subcutaneous, Q24H, Mansy, Jan A, MD   fluticasone (FLONASE) 50 MCG/ACT nasal spray 2 spray, 2 spray, Each Nare, Daily, Mansy, Jan A, MD   hydrALAZINE (APRESOLINE) tablet 50 mg, 50 mg, Oral, BID, Mansy, Jan A,  MD   losartan (COZAAR) tablet 50 mg, 50 mg, Oral, Daily, Mansy, Jan A, MD   magnesium hydroxide (MILK OF MAGNESIA) suspension 30 mL, 30 mL, Oral, Daily PRN, Mansy, Jan A, MD   nitroGLYCERIN (NITROSTAT) SL tablet 0.4 mg, 0.4 mg, Sublingual, Q5 min PRN, Mansy, Jan A, MD   pantoprazole (PROTONIX) EC tablet 40 mg, 40 mg, Oral, Daily, Mansy, Jan A, MD   rosuvastatin (CRESTOR) tablet 40 mg, 40 mg, Oral, Daily, Mansy, Jan A, MD   senna-docusate (Senokot-S) tablet 1 tablet, 1 tablet, Oral, QHS PRN, Mansy, Jan A, MD   traZODone (DESYREL) tablet 25 mg, 25 mg, Oral, QHS PRN,  Mansy, Jan A, MD  Current Outpatient Medications:    amLODipine (NORVASC) 5 MG tablet, TAKE 1 TABLET BY MOUTH EVERY DAY, Disp: 90 tablet, Rfl: 3   aspirin 81 MG tablet, Take 81 mg by mouth daily., Disp: , Rfl:    cyclobenzaprine (FLEXERIL) 5 MG tablet, Take 2 tablets (10 mg total) by mouth 2 (two) times daily as needed for up to 10 days for muscle spasms., Disp: 20 tablet, Rfl: 0   fluticasone (FLONASE) 50 MCG/ACT nasal spray, Place 2 sprays into both nostrils daily., Disp: 16 g, Rfl: 0   furosemide (LASIX) 40 MG tablet, TAKE 1 TABLET BY MOUTH DAILY AS NEEDED FOR EDEMA (Patient taking differently: Take 40 mg by mouth daily as needed for edema.), Disp: 30 tablet, Rfl: 0   hydrALAZINE (APRESOLINE) 50 MG tablet, TAKE 1 TABLET BY MOUTH TWICE A DAY, Disp: 180 tablet, Rfl: 3   HYDROcodone-acetaminophen (NORCO/VICODIN) 5-325 MG tablet, Take 1 tablet by mouth every 6 (six) hours as needed., Disp: 5 tablet, Rfl: 0   losartan (COZAAR) 50 MG tablet, TAKE 1 TABLET BY MOUTH EVERY DAY, Disp: 90 tablet, Rfl: 3   nitroGLYCERIN (NITROSTAT) 0.4 MG SL tablet, PLACE 1 TABLET UNDER THE TONGUE EVERY 5 MINUTES AS NEEDED., Disp: 25 tablet, Rfl: 3   pantoprazole (PROTONIX) 40 MG tablet, TAKE 1 TABLET BY MOUTH EVERY DAY, Disp: 90 tablet, Rfl: 1   rosuvastatin (CRESTOR) 40 MG tablet, TAKE 1 TABLET BY MOUTH EVERY DAY, Disp: 90 tablet, Rfl: 3   Vitamin D, Ergocalciferol, (DRISDOL) 50000 units CAPS capsule, Take 50,000 Units by mouth every 30 (thirty) days. (Patient not taking: Reported on 04/28/2021), Disp: , Rfl: 0   Exam: Current vital signs: BP (!) 141/69 (BP Location: Left Arm)    Pulse 75    Temp 98.8 F (37.1 C) (Oral)    Resp 14    SpO2 99%  Vital signs in last 24 hours: Temp:  [98.4 F (36.9 C)-98.8 F (37.1 C)] 98.8 F (37.1 C) (02/26 1730) Pulse Rate:  [67-84] 75 (02/27 0044) Resp:  [12-27] 14 (02/27 0044) BP: (115-174)/(65-85) 141/69 (02/27 0044) SpO2:  [96 %-100 %] 99 % (02/27 0044) General: Awake alert  in no distress HEENT: Normocephalic/atraumatic Lungs: Clear Cardiovascular: Regular rate rhythm Neurological exam-not a great exam due to patient being in deep sleep and not very cooperative with exam. She was seen around 1:45 AM, and deep sleep, opens eyes to voice but keeps falling asleep during the interview. Follows commands Speech is mildly dysarthric Cranial nerves: Pupils equal round react light, extraocular movements intact, visual fields appear full, face grossly appears symmetric but she is not very cooperative with the exam. Motor examination with right upper and lower extremity drift but no drift in the left upper and lower extremity. Tender and decreased ROM at the right shoulder. Sensation: Somewhat  diminished on the right in comparison to the left Coordination difficult to assess NIH stroke scale 1a Level of Conscious.: 0 1b LOC Questions: 1 1c LOC Commands: 0 2 Best Gaze: 0 3 Visual: 0 4 Facial Palsy: 0 5a Motor Arm - left: 0 5b Motor Arm - Right: 2 6a Motor Leg - Left: 0 6b Motor Leg - Right: 1 7 Limb Ataxia: 0 8 Sensory: 1 9 Best Language: 0 10 Dysarthria: 1 11 Extinct. and Inatten.: 0 TOTAL: 5  Labs I have reviewed labs in epic and the results pertinent to this consultation are:  CBC    Component Value Date/Time   WBC 6.5 11/05/2021 1742   RBC 4.48 11/05/2021 1742   HGB 13.6 11/05/2021 1748   HCT 40.0 11/05/2021 1748   PLT 179 11/05/2021 1742   MCV 89.3 11/05/2021 1742   MCH 29.5 11/05/2021 1742   MCHC 33.0 11/05/2021 1742   RDW 15.3 11/05/2021 1742   RDW 18.0 (H) 11/16/2014 0838   LYMPHSABS 1.8 11/05/2021 1742   LYMPHSABS 1.8 11/16/2014 0838   MONOABS 0.6 11/05/2021 1742   EOSABS 0.1 11/05/2021 1742   EOSABS 0.1 11/16/2014 0838   BASOSABS 0.1 11/05/2021 1742   BASOSABS 0.0 11/16/2014 0838    CMP     Component Value Date/Time   NA 141 11/05/2021 1748   NA 139 01/19/2015 1048   K 4.1 11/05/2021 1748   CL 106 11/05/2021 1748   CO2 25  11/05/2021 1742   GLUCOSE 115 (H) 11/05/2021 1748   BUN 15 11/05/2021 1748   BUN 14 01/19/2015 1048   CREATININE 1.20 (H) 11/05/2021 1748   CREATININE 0.96 03/02/2014 1057   CALCIUM 9.2 11/05/2021 1742   PROT 6.5 11/05/2021 1742   PROT 6.7 01/19/2015 1048   ALBUMIN 3.1 (L) 11/05/2021 1742   ALBUMIN 3.8 01/19/2015 1048   AST 31 11/05/2021 1742   ALT 24 11/05/2021 1742   ALKPHOS 86 11/05/2021 1742   BILITOT 0.6 11/05/2021 1742   BILITOT 0.3 01/19/2015 1048   GFRNONAA 41 (L) 11/05/2021 1742   GFRAA 37 (L) 06/05/2020 2049   Imaging I have reviewed the images obtained:  CT-head-no acute changes  MRI examination of the brain-small acute infarct in the dorsal left pons, no hemorrhage or mass effect.  Chronic small vessel disease and generalized volume loss   Assessment:  88 year old woman with above past medical history presents for about 1 day history of right sided tingling and numbness-exam somewhat difficult at the time of my examination due to her being very sleepy at this hour of the day, does have some right hemiparesis with limited range of motion of the right shoulder but has both motor and sensory deficits on the right side compared to the left. MRI of the brain with the dorsal pontine stroke-likely small vessel etiology According to the family, has had trouble in maintaining blood pressures in the normal range over the past few weeks to months. Question some underlying cognitive slowing as well.  Impression: Pontine stroke  Recommendations Admit to hospitalist Frequent neurochecks Telemetry Aspirin 81 and Plavix 75 High intensity statin Family is concerned about residual function-for head and neck vessel imaging, get MRA head without contrast and carotid Dopplers. 2D echo A1c Lipid panel PT OT Speech therapy Blood pressure goal: For now allow permissive hypertension and treat only if systolic blood pressures greater than 220 on a as needed basis.  Blood pressure  can start to be normalized in the next 1 to 2  days. Stroke team will follow   -- Amie Portland, MD Neurologist [facility] Pager: [phone]

## 2021-11-06 NOTE — Assessment & Plan Note (Signed)
-  This involves infarction of the left dorsal pons with subsequent right-sided numbness. - The patient will be admitted to a medical telemetry bed. - We will follow neurochecks every 4 hours for 24 hours. - We will continue the patient on aspirin and Plavix. - We will obtain a brain MRI with a of the head and bilateral carotid Doppler. - We will obtain 2D echo with bubble study. - Neurology consult was obtained by Dr. Wilford Corner and recommendations are appreciated.

## 2021-11-06 NOTE — ED Notes (Signed)
Breakfast order placed ?

## 2021-11-06 NOTE — Assessment & Plan Note (Signed)
-   We will continue aspirin, Cozaar and statin therapy as well as as needed sublingual nitroglycerin.[name]

## 2021-11-06 NOTE — Assessment & Plan Note (Signed)
-   We will continue statin therapy. 

## 2021-11-06 NOTE — Progress Notes (Addendum)
STROKE TEAM PROGRESS NOTE   INTERVAL HISTORY Patient is seen in her room with her daughter at the bedside.  Yesterday, she experienced right sided tingling and numbness with right arm weakness.  MRI brian reveals dorsal pontine stroke.  Vitals:   11/06/21 0300 11/06/21 0315 11/06/21 0615 11/06/21 1007  BP: (!) 154/96 (!) 168/75 133/84 (!) 165/82  Pulse: 71 62 72 70  Resp: 18 19 (!) 23 (!) 22  Temp:      TempSrc:      SpO2: 100% 97% 96% 98%   CBC:  Recent Labs  Lab 11/05/21 1742 11/05/21 1748 11/06/21 0359  WBC 6.5  --  4.8  NEUTROABS 3.9  --   --   HGB 13.2 13.6 12.4  HCT 40.0 40.0 37.5  MCV 89.3  --  89.5  PLT 179  --  155   Basic Metabolic Panel:  Recent Labs  Lab 11/05/21 1742 11/05/21 1748 11/06/21 0359  NA 138 141  --   K 4.1 4.1  --   CL 106 106  --   CO2 25  --   --   GLUCOSE 120* 115*  --   BUN 14 15  --   CREATININE 1.27* 1.20* 1.23*  CALCIUM 9.2  --   --    Lipid Panel:  Recent Labs  Lab 11/06/21 0400  CHOL 132  TRIG 77  HDL 50  CHOLHDL 2.6  VLDL 15  LDLCALC 67   HgbA1c:  Recent Labs  Lab 11/06/21 0359  HGBA1C 5.4   Urine Drug Screen: No results for input(s): LABOPIA, COCAINSCRNUR, LABBENZ, AMPHETMU, THCU, LABBARB in the last 168 hours.  Alcohol Level No results for input(s): ETH in the last 168 hours.  IMAGING past 24 hours CT HEAD WO CONTRAST  Result Date: 11/05/2021 CLINICAL DATA:  Neuro deficit, acute, stroke suspected. Right side numbness EXAM: CT HEAD WITHOUT CONTRAST TECHNIQUE: Contiguous axial images were obtained from the base of the skull through the vertex without intravenous contrast. RADIATION DOSE REDUCTION: This exam was performed according to the departmental dose-optimization program which includes automated exposure control, adjustment of the mA and/or kV according to patient size and/or use of iterative reconstruction technique. COMPARISON:  None. FINDINGS: Brain: There is atrophy and chronic small vessel disease changes.  No acute intracranial abnormality. Specifically, no hemorrhage, hydrocephalus, mass lesion, acute infarction, or significant intracranial injury. Vascular: No hyperdense vessel or unexpected calcification. Skull: No acute calvarial abnormality. Sinuses/Orbits: No acute findings Other: None IMPRESSION: Atrophy, chronic microvascular disease. No acute intracranial abnormality. Electronically Signed   By: Charlett Nose M.D.   On: 11/05/2021 18:50   MR ANGIO HEAD WO CONTRAST  Result Date: 11/06/2021 CLINICAL DATA:  Stroke follow-up EXAM: MRA HEAD WITHOUT CONTRAST TECHNIQUE: Angiographic images of the Circle of Willis were acquired using MRA technique without intravenous contrast. COMPARISON:  Brain MRI from earlier today FINDINGS: Anterior circulation: Motion degraded. Atheromatous type irregularity of the carotid siphons without suspected flow limiting stenosis. Motion artifact especially affects the MCA bifurcation and proximal A2 segments, levels not assessed. No gross obstructive stenosis or aneurysm. Posterior circulation: Vertebral, basilar, and posteroinferior cerebral arteries are patent and smoothly contoured except for the basilar is distorted by artifact. Smoothly contoured and diffusely patent posterior cerebral arteries. There is likely some atherosclerosis of medium vessels, accentuated by motion. IMPRESSION: No emergent finding when allowing for moderate artifact. No proximal flow limiting stenosis not explained by motion artifact. Electronically Signed   By: Audry Riles.D.  On: 11/06/2021 06:07   MR BRAIN WO CONTRAST  Result Date: 11/06/2021 CLINICAL DATA:  Neuro deficit, acute, stroke suspected r sided numbness/tingling. EXAM: MRI HEAD WITHOUT CONTRAST TECHNIQUE: Multiplanar, multiecho pulse sequences of the brain and surrounding structures were obtained without intravenous contrast. COMPARISON:  None. FINDINGS: Brain: Small acute infarct of the dorsal left pons. No acute or chronic  hemorrhage. There is multifocal hyperintense T2-weighted signal within the white matter. Generalized volume loss without a clear lobar predilection. The midline structures are normal. Vascular: Major flow voids are preserved. Skull and upper cervical spine: Normal calvarium and skull base. Visualized upper cervical spine and soft tissues are normal. Sinuses/Orbits:No paranasal sinus fluid levels or advanced mucosal thickening. No mastoid or middle ear effusion. Normal orbits. IMPRESSION: 1. Small acute infarct of the dorsal left pons. No hemorrhage or mass effect. 2. Findings of chronic small vessel ischemic disease and generalized volume loss. Electronically Signed   By: Ulyses Jarred M.D.   On: 11/06/2021 00:56   ECHOCARDIOGRAM COMPLETE BUBBLE STUDY  Result Date: 11/06/2021    ECHOCARDIOGRAM REPORT   Patient Name:   [name] Date of Exam: 11/06/2021 Medical Rec #:  [id number]      Height:       65.0 in Accession #:    [id number]     Weight:       181.0 lb Date of Birth:  [birth year]      BSA:          1.896 m Patient Age:    88 years       BP:           173/90 mmHg Patient Gender: F              HR:           88 bpm. Exam Location:  Inpatient Procedure: 2D Echo, Color Doppler, Cardiac Doppler, Saline Contrast Bubble Study            and Strain Analysis Indications:    Stroke (cerebrum) ([place]) [id number]  History:        Patient has prior history of Echocardiogram examinations, most                 recent 10/08/2018. CAD and Previous Myocardial Infarction; Risk                 Factors:Sleep Apnea, Dyslipidemia and Hypertension.  Sonographer:    Bernadene Person RDCS Referring Phys: [id number] JAN A Lynn  1. Left ventricular ejection fraction, by estimation, is 65 to 70%. The left ventricle has normal function. The left ventricle has no regional wall motion abnormalities. There is moderate concentric left ventricular hypertrophy. Left ventricular diastolic parameters are consistent with Grade I diastolic  dysfunction (impaired relaxation).  2. Right ventricular systolic function is normal. The right ventricular size is normal. There is normal pulmonary artery systolic pressure.  3. The mitral valve is normal in structure. Trivial mitral valve regurgitation.  4. The aortic valve is tricuspid. There is mild calcification of the aortic valve. There is mild thickening of the aortic valve. Aortic valve regurgitation is mild. Aortic valve sclerosis/calcification is present, without any evidence of aortic stenosis.  5. The inferior vena cava is normal in size with greater than 50% respiratory variability, suggesting right atrial pressure of 3 mmHg.  6. Agitated saline contrast bubble study was negative, with no evidence of any interatrial shunt. Comparison(s): Compared to prior TTE in 2020, there is no significant change. Conclusion(s)/Recommendation(s):  No intracardiac source of embolism detected on this transthoracic study. Consider a transesophageal echocardiogram to exclude cardiac source of embolism if clinically indicated. FINDINGS  Left Ventricle: Left ventricular ejection fraction, by estimation, is 65 to 70%. The left ventricle has normal function. The left ventricle has no regional wall motion abnormalities. The left ventricular internal cavity size was normal in size. There is  moderate concentric left ventricular hypertrophy. Left ventricular diastolic parameters are consistent with Grade I diastolic dysfunction (impaired relaxation). Right Ventricle: The right ventricular size is normal. No increase in right ventricular wall thickness. Right ventricular systolic function is normal. There is normal pulmonary artery systolic pressure. The tricuspid regurgitant velocity is 1.99 m/s, and  with an assumed right atrial pressure of 3 mmHg, the estimated right ventricular systolic pressure is 123456 mmHg. Left Atrium: Left atrial size was normal in size. Right Atrium: Right atrial size was normal in size. Pericardium:  There is no evidence of pericardial effusion. Mitral Valve: The mitral valve is normal in structure. There is mild thickening of the mitral valve leaflet(s). There is mild calcification of the mitral valve leaflet(s). Mild mitral annular calcification. Trivial mitral valve regurgitation. Tricuspid Valve: The tricuspid valve is normal in structure. Tricuspid valve regurgitation is trivial. Aortic Valve: The aortic valve is tricuspid. There is mild calcification of the aortic valve. There is mild thickening of the aortic valve. Aortic valve regurgitation is mild. Aortic regurgitation PHT measures 404 msec. Aortic valve sclerosis/calcification is present, without any evidence of aortic stenosis. Pulmonic Valve: The pulmonic valve was normal in structure. Pulmonic valve regurgitation is trivial. Aorta: The aortic root is normal in size and structure. Venous: The inferior vena cava is normal in size with greater than 50% respiratory variability, suggesting right atrial pressure of 3 mmHg. IAS/Shunts: The atrial septum is grossly normal. Agitated saline contrast was given intravenously to evaluate for intracardiac shunting. Agitated saline contrast bubble study was negative, with no evidence of any interatrial shunt.  LEFT VENTRICLE PLAX 2D LVIDd:         3.60 cm     Diastology LVIDs:         2.30 cm     LV e' medial:    5.45 cm/s LV PW:         1.40 cm     LV E/e' medial:  17.4 LV IVS:        1.20 cm     LV e' lateral:   6.29 cm/s LVOT diam:     2.00 cm     LV E/e' lateral: 15.1 LV SV:         85 LV SV Index:   45          2D Longitudinal Strain LVOT Area:     3.14 cm    2D Strain GLS Avg:     -24.5 %  LV Volumes (MOD) LV vol d, MOD A2C: 53.6 ml LV vol d, MOD A4C: 70.7 ml LV vol s, MOD A2C: 16.0 ml LV vol s, MOD A4C: 14.8 ml LV SV MOD A2C:     37.6 ml LV SV MOD A4C:     70.7 ml LV SV MOD BP:      46.2 ml RIGHT VENTRICLE RV S prime:     14.50 cm/s TAPSE (M-mode): 2.0 cm LEFT ATRIUM             Index        RIGHT ATRIUM  Index LA diam:        3.70 cm 1.95 cm/m   RA Area:     14.60 cm LA Vol (A2C):   41.8 ml 22.05 ml/m  RA Volume:   34.30 ml  18.09 ml/m LA Vol (A4C):   52.8 ml 27.85 ml/m LA Biplane Vol: 48.4 ml 25.53 ml/m  AORTIC VALVE LVOT Vmax:         135.67 cm/s LVOT Vmean:        92.667 cm/s LVOT VTI:          0.269 m AI PHT:            404 msec AR Vena Contracta: 0.20 cm  AORTA Ao Root diam: 3.10 cm Ao Asc diam:  3.60 cm MITRAL VALVE                TRICUSPID VALVE MV Area (PHT): 4.21 cm     TR Peak grad:   15.8 mmHg MV Decel Time: 180 msec     TR Vmax:        199.00 cm/s MV E velocity: 95.10 cm/s MV A velocity: 148.00 cm/s  SHUNTS MV E/A ratio:  0.64         Systemic VTI:  0.27 m                             Systemic Diam: 2.00 cm Gwyndolyn Kaufman MD Electronically signed by Gwyndolyn Kaufman MD Signature Date/Time: 11/06/2021/12:25:48 PM    Final    VAS US CAROTID (at [facility] and [facility] only)  Result Date: 11/06/2021 Carotid Arterial Duplex Study Patient Name:  [name]  Date of Exam:   11/06/2021 Medical Rec #: [id number]       Accession #:    [id number] Date of Birth: [birth year]       Patient Gender: F Patient Age:   88 years Exam Location:  [facility] Procedure:      VAS US CAROTID Referring Phys: Eugenie Norrie --------------------------------------------------------------------------------  Indications:       CVA. Risk Factors:      Hypertension, hyperlipidemia, coronary artery disease. Comparison Study:  no prior Performing Technologist: Archie Patten RVS  Examination Guidelines: A complete evaluation includes B-mode imaging, spectral Doppler, color Doppler, and power Doppler as needed of all accessible portions of each vessel. Bilateral testing is considered an integral part of a complete examination. Limited examinations for reoccurring indications may be performed as noted.  Right Carotid Findings: +----------+--------+--------+--------+------------------+--------+             PSV cm/s EDV  cm/s Stenosis Plaque Description Comments  +----------+--------+--------+--------+------------------+--------+  CCA Prox   76       7                 heterogenous                 +----------+--------+--------+--------+------------------+--------+  CCA Distal 46       7                 heterogenous                 +----------+--------+--------+--------+------------------+--------+  ICA Prox   70       21       1-39%    heterogenous                 +----------+--------+--------+--------+------------------+--------+  ICA Distal 86  20                                             +----------+--------+--------+--------+------------------+--------+  ECA        88                                                      +----------+--------+--------+--------+------------------+--------+ +----------+--------+-------+--------+-------------------+             PSV cm/s EDV cms Describe Arm Pressure (mmHG)  +----------+--------+-------+--------+-------------------+  Subclavian 67                                             +----------+--------+-------+--------+-------------------+ +---------+--------+--+--------+--+---------+  Vertebral PSV cm/s 59 EDV cm/s 11 Antegrade  +---------+--------+--+--------+--+---------+  Left Carotid Findings: +----------+--------+--------+--------+------------------+--------+             PSV cm/s EDV cm/s Stenosis Plaque Description Comments  +----------+--------+--------+--------+------------------+--------+  CCA Prox   78       15                heterogenous                 +----------+--------+--------+--------+------------------+--------+  CCA Distal 54       9                 heterogenous                 +----------+--------+--------+--------+------------------+--------+  ICA Prox   58       17       1-39%    heterogenous                 +----------+--------+--------+--------+------------------+--------+  ICA Distal 59       16                                              +----------+--------+--------+--------+------------------+--------+  ECA        78                                                      +----------+--------+--------+--------+------------------+--------+ +----------+--------+--------+--------+-------------------+             PSV cm/s EDV cm/s Describe Arm Pressure (mmHG)  +----------+--------+--------+--------+-------------------+  Subclavian 122                                             +----------+--------+--------+--------+-------------------+ +---------+--------+--+--------+--+---------+  Vertebral PSV cm/s 40 EDV cm/s 11 Antegrade  +---------+--------+--+--------+--+---------+   Summary: Right Carotid: Velocities in the right ICA are consistent with a 1-39% stenosis. Left Carotid: Velocities in the left ICA are consistent with a 1-39% stenosis. Vertebrals: Bilateral vertebral arteries demonstrate antegrade flow. *See table(s) above for measurements and observations.  Preliminary     PHYSICAL EXAM  General - Well nourished, well developed, in no apparent distress  Mental Status -  Level of arousal and orientation to time, place, and person were intact.  No aphasia.   Cranial Nerves II - XII - II - Visual field intact OU. No field cut. III, IV, VI - Extraocular movements intact        V - Facial sensation intact bilaterally VII - Facial movement intact bilaterally VIII - Hearing & vestibular intact bilaterally X - Palate elevates symmetrically XI - Chin turning & shoulder shrug intact bilaterally XII - Tongue protrusion intact   Motor Strength - The patients strength was normal on left side. Drift in right arm/leg. She had pain in her right shoulder/neck and right knee limited full exam.   Reflexes - The patients reflexes were symmetrical in all extremities and he had no pathological reflexes   Sensory - Light touch, were assessed and were symmetrical   Coordination - The patient had normal movements in the hands and feet with no  ataxia or dysmetria.  Tremor was absent   Gait and Station - deferred.    ASSESSMENT/PLAN [name] is a 88 y.o. female with history of arthritis, CKD3, CAD s/ps stenting, HTN, HLD, NSTEMI, obesity and OSA presenting with right sided tingling and numbness with right arm weakness.  MRI brian reveals dorsal pontine stroke.  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 Small vessel disease. Atrophy.  MRI  small acute infarct of dorsal left pons, chronic small vessel ischemic disease MRA  no emergent findings, motion degraded Carotid Doppler  1-39% stenosis in left and right ICA 2D Echo EF [id number], grade 1 diastolic dysfunction, no interatrial shunt LDL 67 HgbA1c 5.4 VTE prophylaxis - lovenox    Diet   Diet Heart Room service appropriate? Yes; Fluid consistency: Thin   aspirin 81 mg daily prior to admission, now on aspirin 81 mg daily and clopidogrel 75 mg daily.  Therapy recommendations:  pending Disposition:  pending  Hypertension Home meds:  amlodipine 5 mg daily, losartan 50 mg daily, hydralazine 50 mg daily Stable Permissive hypertension (OK if < 220/120) but gradually normalize in 5-7 days Long-term BP goal normotensive  Hyperlipidemia Home meds:  rosuvastatin 40 mg daily,  LDL 67, goal < 70 Continue statin at discharge  Other Stroke Risk Factors Advanced Age >/= 36  Obesity, There is no height or weight on file to calculate BMI., BMI >/= 30 associated with increased stroke risk, recommend weight loss, diet and exercise as appropriate  Obstructive sleep apnea, not on CPAP at home  Other Active Problems none  Hospital day # [place] , MSN, AGACNP-BC [facility] See Amion for schedule and pager information 11/06/2021 1:06 PM  Seen earlier today by Dr. Rory Percy. Stroke workup complete. Updated pt and daughter. Follow PT/OT recommendations for placement. DAPT for 3  weeks then aspirin 81 only. 30 day monitor for afib on discharge. Followup in stroke clinic after discharge at [facility].  Neurology will sign off. Call with questions.     To contact Stroke Continuity provider, please refer to [email or web address] After hours, contact General Neurology

## 2021-11-06 NOTE — Progress Notes (Signed)
Pt admitted from ED with stroke symptoms, alert and oriented, c/o of slight pain in right shoulder, settled in bed with call light and family at bedside, tele monitor put and verified on pt, safety concern explained and initiated as well, was however reassured and will continue to monitor, v/s stable. [place], Audra Bellard Efe

## 2021-11-06 NOTE — Progress Notes (Signed)
°  Echocardiogram 2D Echocardiogram has been performed.  Augustine Radar 11/06/2021, 9:58 AM

## 2021-11-06 NOTE — Evaluation (Addendum)
Occupational Therapy Evaluation Patient Details Name: [name] MRN: [id number] DOB: Sep 12, 1933 Today's Date: 11/06/2021   History of Present Illness Pt isa 88 y/o female presenting on 2/26 with R sided numbness and weakness. 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 PMH includes: CKD3, CAD, HTN, obesity, sleep apnea.   Clinical Impression   PTA patient independent with ADLs, assist from family with IADLs. Admitted for above presents with problem list below, including R sided sensory deficits, R shoulder pain, impaired balance and decreased activity tolerance.  Pt currently requires mod assist for bed mobility, min assist for transfers and mobility in room using hand held assist and up to mod assist for ADLs.  Unsteady in standing, would benefit from RW next session. She reports having a supportive family, who can assist her when her daughter (who she lives with) is working. She presents with decreased awareness and safety, educated on importance of having assistance for mobility and ADLs at this time. Believe she will benefit from continued OT services while admitted and after dc at [facility] level to optimize independence, safety and return to PLOF. Will follow acutely.      Recommendations for follow up therapy are one component of a multi-disciplinary discharge planning process, led by the attending physician.  Recommendations may be updated based on patient status, additional functional criteria and insurance authorization.   Follow Up Recommendations  Home health OT (vs outpatient)    Assistance Recommended at Discharge Frequent or constant Supervision/Assistance  Patient can return home with the following A little help with walking and/or transfers;A little help with bathing/dressing/bathroom;Assistance with cooking/housework;Direct supervision/assist for medications management;Direct supervision/assist for financial management;Assist for transportation;Help with stairs or  ramp for entrance    Functional Status Assessment  Patient has had a recent decline in their functional status and demonstrates the ability to make significant improvements in function in a reasonable and predictable amount of time.  Equipment Recommendations  BSC/3in1    Recommendations for Other Services       Precautions / Restrictions Precautions Precautions: Fall Restrictions Weight Bearing Restrictions: No      Mobility Bed Mobility Overal bed mobility: Needs Assistance Bed Mobility: Supine to Sit, Sit to Supine     Supine to sit: Mod assist, HOB elevated Sit to supine: Min assist, HOB elevated   General bed mobility comments: mod assist for trunk support to ascend    Transfers                          Balance Overall balance assessment: Needs assistance Sitting-balance support: No upper extremity supported, Feet supported Sitting balance-Leahy Scale: Fair     Standing balance support: Single extremity supported, During functional activity Standing balance-Leahy Scale: Poor Standing balance comment: relies on UE and external support                           ADL either performed or assessed with clinical judgement   ADL Overall ADL's : Needs assistance/impaired     Grooming: Minimal assistance;Sitting   Upper Body Bathing: Minimal assistance;Sitting   Lower Body Bathing: Moderate assistance;Sit to/from stand   Upper Body Dressing : Minimal assistance;Sitting   Lower Body Dressing: Moderate assistance;Sit to/from stand   Toilet Transfer: Minimal assistance;Ambulation Toilet Transfer Details (indicate cue type and reason): simulated in room Toileting- Clothing Manipulation and Hygiene: Moderate assistance;Sit to/from stand       Functional  mobility during ADLs: Minimal assistance (hand held assist)       Vision   Vision Assessment?: No apparent visual deficits     Perception     Praxis      Pertinent Vitals/Pain  Pain Assessment Pain Assessment: Faces Faces Pain Scale: Hurts little more Pain Location: R shoulder Pain Descriptors / Indicators: Discomfort, Grimacing, Guarding Pain Intervention(s): Limited activity within patient's tolerance, Monitored during session, Repositioned     Hand Dominance Right   Extremity/Trunk Assessment Upper Extremity Assessment Upper Extremity Assessment: Generalized weakness;RUE deficits/detail RUE Deficits / Details: pt hx of shoulder pain with limited ROM, but now is weaker then normal.  Decreased sensation. RUE: Unable to fully assess due to pain RUE Sensation: decreased light touch RUE Coordination: decreased gross motor   Lower Extremity Assessment Lower Extremity Assessment: Defer to PT evaluation       Communication Communication Communication: No difficulties   Cognition Arousal/Alertness: Awake/alert Behavior During Therapy: WFL for tasks assessed/performed Overall Cognitive Status: Impaired/Different from baseline Area of Impairment: Safety/judgement, Awareness, Problem solving                         Safety/Judgement: Decreased awareness of deficits, Decreased awareness of safety Awareness: Emergent Problem Solving: Slow processing, Difficulty sequencing, Requires verbal cues General Comments: pt verbose, requires redirection for safety and slow processing noted.  Poor awareness to deficits and safety.     General Comments  daughter present and supportive, reports "she doesn't want anyone in her house".  Pt reports she has family and friends who can help her.    Exercises     Shoulder Instructions      Home Living Family/patient expects to be discharged to:: Private residence Living Arrangements: Children Available Help at Discharge: Family;Friend(s);Available 24 hours/day Type of Home: House Home Access: Stairs to enter [employer] of Steps: 5-6 Entrance Stairs-Rails: Can reach both Home Layout: One level      Bathroom Shower/Tub: Producer, television/film/video: Standard     Home Equipment: Agricultural consultant (2 wheels);Rollator (4 wheels);Cane - single point;Shower seat;Grab bars - tub/shower          Prior Functioning/Environment Prior Level of Function : Independent/Modified Independent             Mobility Comments: cane vs rollator in commuinty ADLs Comments: reports family assists with IADLs, she does not drive        OT Problem List: Decreased strength;Decreased range of motion;Decreased activity tolerance;Impaired balance (sitting and/or standing);Decreased coordination;Decreased cognition;Decreased safety awareness;Decreased knowledge of use of DME or AE;Decreased knowledge of precautions;Obesity;Pain;Impaired UE functional use;Impaired sensation      OT Treatment/Interventions: Self-care/ADL training;Therapeutic exercise;DME and/or AE instruction;Therapeutic activities;Patient/family education;Balance training;Cognitive remediation/compensation    OT Goals(Current goals can be found in the care plan section) Acute Rehab OT Goals Patient Stated Goal: home OT Goal Formulation: With patient Time For Goal Achievement: 11/20/21 Potential to Achieve Goals: Good  OT Frequency: Min 2X/week    Co-evaluation              AM-PAC OT "6 Clicks" Daily Activity     Outcome Measure Help from another person eating meals?: A Little Help from another person taking care of personal grooming?: A Little Help from another person toileting, which includes using toliet, bedpan, or urinal?: A Lot Help from another person bathing (including washing, rinsing, drying)?: A Lot Help from another person to put on and taking off regular upper body clothing?: A  Little Help from another person to put on and taking off regular lower body clothing?: A Lot 6 Click Score: 15   End of Session Equipment Utilized During Treatment: Gait belt Nurse Communication: Mobility status  Activity Tolerance:  Patient tolerated treatment well Patient left: in bed;with call bell/phone within reach;with nursing/sitter in room  OT Visit Diagnosis: Other abnormalities of gait and mobility (R26.89);Muscle weakness (generalized) (M62.81);Pain;Other symptoms and signs involving the nervous system (R29.898) Pain - Right/Left: Right Pain - part of body: Shoulder                Time: 1200-1240 OT Time Calculation (min): 40 min Charges:  OT General Charges $OT Visit: 1 Visit OT Evaluation $OT Eval Moderate Complexity: 1 Mod OT Treatments $Self Care/Home Management : 23-37 mins  Barry Brunner, OT Acute Rehabilitation Services Pager [phone] Office [phone]   Chancy Milroy 11/06/2021, 1:38 PM

## 2021-11-06 NOTE — ED Notes (Signed)
Pt and daughter requested to speak with a doctor about the pt's DNR order and wish to change it to a full code. Admitting notified

## 2021-11-06 NOTE — Progress Notes (Signed)
°  PROGRESS NOTE  Patient admitted earlier this morning. See H&P.   Patient admitted with chief complaint of acute onset right foot numbness which progressed to the right upper extremity up to her face.  Patient was seen in the emergency department last week for neck and shoulder pain and was given steroids and muscle relaxants at that time, patient has had elevated blood pressure during those visits.  In the emergency department, MRI brain revealed small acute infarction of the dorsal left pons.  Patient admitted for stroke work-up.  Stroke team consulted, PT OT SLP  Continue aspirin, Plavix, Lipitor Echo without significant change from previous exam Carotid duplex revealed 1 to 39% stenosis bilaterally   Status is: Inpatient Remains inpatient appropriate because: Stroke work-up           Noralee Stain, DO [facility] 11/06/2021, 12:50 PM  Available via Epic secure chat 7am-7pm After these hours, please refer to coverage provider listed on [email or web address]

## 2021-11-07 ENCOUNTER — Other Ambulatory Visit: Payer: Self-pay | Admitting: Physician Assistant

## 2021-11-07 DIAGNOSIS — I639 Cerebral infarction, unspecified: Secondary | ICD-10-CM

## 2021-11-07 MED ORDER — CLOPIDOGREL BISULFATE 75 MG PO TABS: 75.0000 mg | ORAL_TABLET | Freq: Every day | ORAL | 0 refills | Status: AC

## 2021-11-07 NOTE — Progress Notes (Signed)
Discharge instructions given. Patient verbalized understanding and all questions were answered.  ?

## 2021-11-07 NOTE — Evaluation (Signed)
Physical Therapy Evaluation Patient Details Name: [name] MRN: [id number] DOB: 06/03/1934 Today's Date: 11/07/2021  History of Present Illness  Pt isa 88 y/o female presenting on 2/26 with R sided numbness and weakness. 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 PMH includes: CKD3, CAD, HTN, obesity, sleep apnea.    Clinical Impression  [name] is 88 y.o. female admitted with above HPI and diagnosis. Patient is currently limited by functional impairments below (see PT problem list). Patient lives with her daughter and is independent with intermittent use of SPC/Rollator at baseline. Patient limited by Rt UE/LE weakness, impaired balance and coordination. Pt required min assist for gait with no device to prevent LOB on 2 occasions but improved to min guard with no LOB during ambulation with RW. Patient will benefit from continued skilled PT interventions to address impairments and progress independence with mobility, recommending HHPT with initial constant supervision/assist from family. Acute PT will follow and progress as able.        Recommendations for follow up therapy are one component of a multi-disciplinary discharge planning process, led by the attending physician.  Recommendations may be updated based on patient status, additional functional criteria and insurance authorization.  Follow Up Recommendations Home health PT    Assistance Recommended at Discharge Frequent or constant Supervision/Assistance  Patient can return home with the following  A little help with walking and/or transfers;A little help with bathing/dressing/bathroom;Assistance with cooking/housework;Assist for transportation;Help with stairs or ramp for entrance    Equipment Recommendations Rolling walker (2 wheels)  Recommendations for Other Services       Functional Status Assessment Patient has had a recent decline in their functional status and demonstrates the ability to make  significant improvements in function in a reasonable and predictable amount of time.     Precautions / Restrictions Precautions Precautions: Fall Restrictions Weight Bearing Restrictions: No      Mobility  Bed Mobility Overal bed mobility: Needs Assistance Bed Mobility: Supine to Sit     Supine to sit: HOB elevated, Min guard     General bed mobility comments: guarding for safety with supine>sit and pt using bed rail and slightly increased time. pt able to bring LE's off edge and press trunk upright.    Transfers Overall transfer level: Needs assistance Equipment used: 1 person hand held assist, Rolling walker (2 wheels) Transfers: Sit to/from Stand Sit to Stand: Min assist, Min guard           General transfer comment: min assist to stabilize with HHA when rising from EOB wiht no device. pt with slight LOB to right on stand. Balance improved with min guard only to rise to RW. pt using bil UE for power up.    Ambulation/Gait Ambulation/Gait assistance: Min assist, Min guard Gait Distance (Feet): 130 Feet (100, 30) Assistive device: Rolling walker (2 wheels), 1 person hand held assist Gait Pattern/deviations: Step-through pattern, Decreased stride length, Drifts right/left, Shuffle, Knees buckling Gait velocity: decr     General Gait Details: pt with Rt LOB 1x while ambulating with no device and tendency to drift Rt. pt noted to have heavy reliance on therapist for HHA and Rt wrist tone with a flexer pattern with weigthbearing on Rt UE. Pt's balance improved with bil UE support on RW and cues needed for safe proximity. pt progressed to min guard only with RW.  Stairs            Psychologist, prison and probation services  Modified Rankin (Stroke Patients Only)       Balance Overall balance assessment: Needs assistance Sitting-balance support: No upper extremity supported, Feet supported Sitting balance-Leahy Scale: Fair     Standing balance support: Single extremity  supported, During functional activity Standing balance-Leahy Scale: Poor Standing balance comment: heavy reliance on UE and external support                             Pertinent Vitals/Pain Pain Assessment Pain Assessment: No/denies pain Faces Pain Scale: No hurt Pain Location: R shoulder Pain Intervention(s): Monitored during session    Home Living Family/patient expects to be discharged to:: Private residence Living Arrangements: Children Available Help at Discharge: Family;Friend(s);Available 24 hours/day Type of Home: House Home Access: Stairs to enter Entrance Stairs-Rails: Can reach both Entrance Stairs-Number of Steps: 6   Home Layout: One level Home Equipment: Rollator (4 wheels);Cane - single point;Shower seat;Grab bars - tub/shower      Prior Function Prior Level of Function : Independent/Modified Independent             Mobility Comments: pt uses SPC in home and rollator outside of home (at church). reports rarely uses SPC. ADLs Comments: reports family assists with IADLs, she does not drive     Hand Dominance   Dominant Hand: Right    Extremity/Trunk Assessment   Upper Extremity Assessment Upper Extremity Assessment: Defer to OT evaluation RUE Deficits / Details: pt hx of shoulder pain with limited ROM, but now is weaker then normal.  Decreased sensation. RUE Sensation: decreased light touch RUE Coordination: decreased gross motor    Lower Extremity Assessment Lower Extremity Assessment: RLE deficits/detail;LLE deficits/detail RLE Deficits / Details: 4-/5 for hip flex, knee flex/ext, ankld dorsiflexion and 3/5 for plantar flexion with poor coordination at ankle. Light touch and propriceptive testing intact. pt slow with RAMs at ankle and decrease accuracy with heel to shin. RLE Sensation: WNL RLE Coordination: decreased gross motor;decreased fine motor LLE Deficits / Details: 5/5 for hip flex, knee flex/ext, ankld dorsi/plantarflexion.  Light touch and proprioceptive testing intact. LLE Sensation: WNL LLE Coordination: decreased gross motor;decreased fine motor    Cervical / Trunk Assessment Cervical / Trunk Assessment: Normal  Communication   Communication: No difficulties  Cognition Arousal/Alertness: Awake/alert Behavior During Therapy: WFL for tasks assessed/performed Overall Cognitive Status: Impaired/Different from baseline Area of Impairment: Safety/judgement, Awareness, Problem solving                         Safety/Judgement: Decreased awareness of deficits, Decreased awareness of safety Awareness: Anticipatory Problem Solving: Difficulty sequencing, Requires verbal cues General Comments: pt has decreased awareness of deficits related to CVA remarking on knee pain being her limitation rather than new weakness. pt followed cues/commands appropriately with no need for redirection.        General Comments      Exercises     Assessment/Plan    PT Assessment Patient needs continued PT services  PT Problem List Decreased strength;Decreased range of motion;Decreased activity tolerance;Decreased balance;Decreased mobility;Decreased knowledge of use of DME;Decreased knowledge of precautions;Decreased coordination       PT Treatment Interventions DME instruction;Gait training;Stair training;Functional mobility training;Therapeutic exercise;Therapeutic activities;Balance training;Neuromuscular re-education;Cognitive remediation;Patient/family education    PT Goals (Current goals can be found in the Care Plan section)  Acute Rehab PT Goals Patient Stated Goal: get home and back to independence PT Goal Formulation: With patient/family Time For Goal Achievement:  11/21/21 Potential to Achieve Goals: Good    Frequency Min 4X/week     Co-evaluation               AM-PAC PT "6 Clicks" Mobility  Outcome Measure Help needed turning from your back to your side while in a flat bed without using  bedrails?: A Little Help needed moving from lying on your back to sitting on the side of a flat bed without using bedrails?: A Little Help needed moving to and from a bed to a chair (including a wheelchair)?: A Little Help needed standing up from a chair using your arms (e.g., wheelchair or bedside chair)?: A Little Help needed to walk in hospital room?: A Little Help needed climbing 3-5 steps with a railing? : A Lot 6 Click Score: 17    End of Session Equipment Utilized During Treatment: Gait belt Activity Tolerance: Patient tolerated treatment well Patient left: in bed;with call bell/phone within reach;with family/visitor present (sitting EOB) Nurse Communication: Mobility status PT Visit Diagnosis: Unsteadiness on feet (R26.81);Muscle weakness (generalized) (M62.81);Difficulty in walking, not elsewhere classified (R26.2)    Time: 1191-4782 PT Time Calculation (min) (ACUTE ONLY): 31 min   Charges:   PT Evaluation $PT Eval Low Complexity: 1 Low PT Treatments $Gait Training: 8-22 mins        Wynn Maudlin, DPT Acute Rehabilitation Services Office [phone] Pager [phone]   Anitra Lauth 11/07/2021, 9:55 AM

## 2021-11-07 NOTE — Discharge Summary (Signed)
Physician Discharge Summary   Patient: [name] MRN: [id number] DOB: 05-13-1934  Admit date:     11/05/2021  Discharge date: 11/07/21  Discharge Physician: Dessa Phi   PCP: Cory Munch, PA-C   Recommendations at discharge:   Follow up with PCP Follow up with neurology   Discharge Diagnoses: Principal Problem:   Acute CVA (cerebrovascular accident) [facility]) Active Problems:   Essential hypertension   CAD S/P PCI   Dyslipidemia   GERD without esophagitis  Resolved Problems:   * No resolved hospital problems. [facility] Course: HPI by Dr. Sidney Ace: "[name] is a 88 y.o. African-American female with medical history significant for coronary artery disease status post PCI and stent, hypertension, dyslipidemia, GERD, OSA, CKD stage IIIb and osteoarthritis, who presented to the emergency room with acute onset of right foot numbness on her bed around midnight but was able to get off of her bed.  She went to sleep at 10 PM.  When she woke up at 6 AM she felt numbness in her right face as well as right arm, right leg and foot.  Her symptoms have been improving some since 6 AM but not resolved.  She denies any muscle weakness, dysarthria or dysphagia.  No tinnitus or vertigo.  She denied any ataxia and no diplopia.  No urinary or stool incontinence.  No chest pain or palpitations.  She has chronic neck and right shoulder pain.  No fever or chills.  She was seen in the ER for neck and shoulder pain on Saturday 2/18 and placed on steroids and muscle relaxants.  She was also seen on 2/16 for elevated BP that was managed.  ED Course: When she came to the ER, blood pressure was 216/89 with heart rate of 57 with otherwise normal vital signs.  BP later on was 141/69.  Labs revealed a creatinine of 1.2 with otherwise unremarkable CMP.  CBC was within normal. EKG as reviewed by me : Showed normal sinus rhythm with rate of 80 with right bundle branch block and left intrafascicular block  (bifascicular block). Imaging: Noncontrast CT scan showed no acute intracranial normalities.  It showed atrophy and chronic microvascular disease.  Brain MRI without contrast revealed small acute infarction of the dorsal left pons with no hemorrhage or mass effect.  It showed findings of chronic small vessel ischemic disease and generalized volume loss.  The patient was given 1 mg of IV Ativan.  She will be admitted to a medical telemetry bed for further evaluation and management."  Patient was seen by neurology for stroke work-up including MRA, carotid Doppler, echocardiogram, lipid panel, A1c.Marland Kitchen  She was started on dual antiplatelet therapy aspirin and Plavix for 3 weeks then to continue aspirin only.  She was seen by PT OT and recommended for home health therapy.  Message sent to cardiology to arrange for 30-day event monitor for A-fib on discharge.  Follow-up with neurology.     Consultants: Neurology Procedures performed: None Disposition: Home health Diet recommendation:  Cardiac diet  DISCHARGE MEDICATION: Allergies as of 11/07/2021       Reactions   Atorvastatin Nausea And Vomiting   Contrast Media [iodinated Contrast Media] Nausea And Vomiting   Darvon [propoxyphene] Nausea And Vomiting   Codeine Nausea And Vomiting, Anxiety        Medication List     STOP taking these medications    furosemide 40 MG tablet Commonly known as: LASIX   HYDROcodone-acetaminophen 5-325 MG tablet Commonly  known as: NORCO/VICODIN   Vitamin D (Ergocalciferol) 1.25 MG (50000 UNIT) Caps capsule Commonly known as: DRISDOL       TAKE these medications    acetaminophen 500 MG tablet Commonly known as: TYLENOL Take 500 mg by mouth every 6 (six) hours as needed for mild pain or moderate pain.   amLODipine 5 MG tablet Commonly known as: NORVASC TAKE 1 TABLET BY MOUTH EVERY DAY   aspirin 81 MG tablet Take 81 mg by mouth daily.   clopidogrel 75 MG tablet Commonly known as:  Plavix Take 1 tablet (75 mg total) by mouth daily for 21 days.   cyclobenzaprine 5 MG tablet Commonly known as: FLEXERIL Take 2 tablets (10 mg total) by mouth 2 (two) times daily as needed for up to 10 days for muscle spasms.   fluticasone 50 MCG/ACT nasal spray Commonly known as: FLONASE Place 2 sprays into both nostrils daily.   hydrALAZINE 50 MG tablet Commonly known as: APRESOLINE TAKE 1 TABLET BY MOUTH TWICE A DAY   losartan 50 MG tablet Commonly known as: COZAAR TAKE 1 TABLET BY MOUTH EVERY DAY   nitroGLYCERIN 0.4 MG SL tablet Commonly known as: NITROSTAT PLACE 1 TABLET UNDER THE TONGUE EVERY 5 MINUTES AS NEEDED. What changed:  how much to take how to take this when to take this reasons to take this   ondansetron 4 MG tablet Commonly known as: ZOFRAN Take 4 mg by mouth every 8 (eight) hours as needed for vomiting or nausea.   oxyCODONE-acetaminophen 5-325 MG tablet Commonly known as: PERCOCET/ROXICET Take 1 tablet by mouth every 8 (eight) hours as needed for severe pain.   pantoprazole 40 MG tablet Commonly known as: PROTONIX TAKE 1 TABLET BY MOUTH EVERY DAY   rosuvastatin 40 MG tablet Commonly known as: CRESTOR TAKE 1 TABLET BY MOUTH EVERY DAY               Durable Medical Equipment  (From admission, onward)           Start     Ordered   11/07/21 1006  For home use only DME Walker rolling  Once       Question Answer Comment  Walker: With 5 Inch Wheels   Patient needs a walker to treat with the following condition CVA (cerebral vascular accident) ([place])      11/07/21 1005            Follow-up Information     Collene Mares L, PA-C Follow up.   Specialties: Physician Assistant, Internal Medicine Contact information: [place] [address] [phone]         [place]. Schedule an appointment as soon as possible for a visit in 4 week(s).   Why: Stroke follow up Contact  information: [address]     [place] [phone]                Discharge Exam: Danley Danker Weights   11/06/21 2036  Weight: 85 kg   Examination: General exam: Appears calm and comfortable  Respiratory system: Clear to auscultation. Respiratory effort normal. Cardiovascular system: S1 & S2 heard, RRR. No pedal edema. Gastrointestinal system: Abdomen is nondistended, soft and nontender. Normal bowel sounds heard. Central nervous system: Alert and oriented. Non focal exam. Speech clear  Extremities: Symmetric in appearance bilaterally  Skin: No rashes, lesions or ulcers on exposed skin  Psychiatry: Judgement and insight appear stable. Mood & affect appropriate.  Condition at discharge: stable  The results of significant diagnostics from this hospitalization (including imaging, microbiology, ancillary and laboratory) are listed below for reference.   Imaging Studies: DG Shoulder Right  Result Date: 10/28/2021 CLINICAL DATA:  Right shoulder pain EXAM: RIGHT SHOULDER - 2+ VIEW COMPARISON:  None. FINDINGS: No fracture or dislocation is seen. Severe degenerative changes are noted in the right shoulder. There is marked joint space narrowing. Bony spurs are noted. Undulations are seen in the articular surface of head of the right humerus which may be related to degenerative arthritis or residual change from previous injury. IMPRESSION: No fracture or dislocation is seen in the right shoulder. Severe degenerative changes are noted. Electronically Signed   By: Elmer Picker M.D.   On: 10/28/2021 13:25   CT HEAD WO CONTRAST  Result Date: 11/05/2021 CLINICAL DATA:  Neuro deficit, acute, stroke suspected. Right side numbness EXAM: CT HEAD WITHOUT CONTRAST TECHNIQUE: Contiguous axial images were obtained from the base of the skull through the vertex without intravenous contrast. RADIATION DOSE REDUCTION: This exam was performed according to the  departmental dose-optimization program which includes automated exposure control, adjustment of the mA and/or kV according to patient size and/or use of iterative reconstruction technique. COMPARISON:  None. FINDINGS: Brain: There is atrophy and chronic small vessel disease changes. No acute intracranial abnormality. Specifically, no hemorrhage, hydrocephalus, mass lesion, acute infarction, or significant intracranial injury. Vascular: No hyperdense vessel or unexpected calcification. Skull: No acute calvarial abnormality. Sinuses/Orbits: No acute findings Other: None IMPRESSION: Atrophy, chronic microvascular disease. No acute intracranial abnormality. Electronically Signed   By: Rolm Baptise M.D.   On: 11/05/2021 18:50   CT Cervical Spine Wo Contrast  Result Date: 10/28/2021 CLINICAL DATA:  Right-sided neck and shoulder pain, chronic degenerative changes reported by prior x-ray, remote history of fall 30 years ago EXAM: CT CERVICAL SPINE WITHOUT CONTRAST TECHNIQUE: Multidetector CT imaging of the cervical spine was performed without intravenous contrast. Multiplanar CT image reconstructions were also generated. RADIATION DOSE REDUCTION: This exam was performed according to the departmental dose-optimization program which includes automated exposure control, adjustment of the mA and/or kV according to patient size and/or use of iterative reconstruction technique. COMPARISON:  None. FINDINGS: Alignment: Degenerative straightening of the normal cervical lordosis. Skull base and vertebrae: No acute fracture. 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 No primary bone lesion or focal pathologic process. Soft tissues and spinal canal: No prevertebral fluid or swelling. No visible canal hematoma. Disc levels: Moderate to severe disc space height loss and osteophytosis of C3 through C7. Upper chest: Negative. Other: None. IMPRESSION: 1. No fracture or static subluxation of the cervical spine. 2.  Moderate to severe disc space height loss and osteophytosis of C3 through C7. Cervical disc and neural foraminal pathology may be further evaluated by MRI if indicated by neurologically localizing signs and symptoms. Electronically Signed   By: Delanna Ahmadi M.D.   On: 10/28/2021 13:12   MR ANGIO HEAD WO CONTRAST  Result Date: 11/06/2021 CLINICAL DATA:  Stroke follow-up EXAM: MRA HEAD WITHOUT CONTRAST TECHNIQUE: Angiographic images of the Circle of Willis were acquired using MRA technique without intravenous contrast. COMPARISON:  Brain MRI from earlier today FINDINGS: Anterior circulation: Motion degraded. Atheromatous type irregularity of the carotid siphons without suspected flow limiting stenosis. Motion artifact especially affects the MCA bifurcation and proximal A2 segments, levels not assessed. No gross obstructive stenosis or aneurysm. Posterior circulation: Vertebral, basilar, and posteroinferior cerebral arteries are patent and smoothly contoured except  for the basilar is distorted by artifact. Smoothly contoured and diffusely patent posterior cerebral arteries. There is likely some atherosclerosis of medium vessels, accentuated by motion. IMPRESSION: No emergent finding when allowing for moderate artifact. No proximal flow limiting stenosis not explained by motion artifact. Electronically Signed   By: Jorje Guild M.D.   On: 11/06/2021 06:07   MR BRAIN WO CONTRAST  Result Date: 11/06/2021 CLINICAL DATA:  Neuro deficit, acute, stroke suspected r sided numbness/tingling. EXAM: MRI HEAD WITHOUT CONTRAST TECHNIQUE: Multiplanar, multiecho pulse sequences of the brain and surrounding structures were obtained without intravenous contrast. COMPARISON:  None. FINDINGS: Brain: Small acute infarct of the dorsal left pons. No acute or chronic hemorrhage. There is multifocal hyperintense T2-weighted signal within the white matter. Generalized volume loss without a clear lobar predilection. The midline  structures are normal. Vascular: Major flow voids are preserved. Skull and upper cervical spine: Normal calvarium and skull base. Visualized upper cervical spine and soft tissues are normal. Sinuses/Orbits:No paranasal sinus fluid levels or advanced mucosal thickening. No mastoid or middle ear effusion. Normal orbits. IMPRESSION: 1. Small acute infarct of the dorsal left pons. No hemorrhage or mass effect. 2. Findings of chronic small vessel ischemic disease and generalized volume loss. Electronically Signed   By: Ulyses Jarred M.D.   On: 11/06/2021 00:56   ECHOCARDIOGRAM COMPLETE BUBBLE STUDY  Result Date: 11/06/2021    ECHOCARDIOGRAM REPORT   Patient Name:   [name] Date of Exam: 11/06/2021 Medical Rec #:  [id number]      Height:       65.0 in Accession #:    [id number]     Weight:       181.0 lb Date of Birth:  June 17, 1934      BSA:          1.896 m Patient Age:    88 years       BP:           173/90 mmHg Patient Gender: F              HR:           88 bpm. Exam Location:  Inpatient Procedure: 2D Echo, Color Doppler, Cardiac Doppler, Saline Contrast Bubble Study            and Strain Analysis Indications:    Stroke (cerebrum) ([place]) [id number]  History:        Patient has prior history of Echocardiogram examinations, most                 recent 10/08/2018. CAD and Previous Myocardial Infarction; Risk                 Factors:Sleep Apnea, Dyslipidemia and Hypertension.  Sonographer:    Bernadene Person RDCS Referring Phys: [id number] JAN A [place]  1. Left ventricular ejection fraction, by estimation, is 65 to 70%. The left ventricle has normal function. The left ventricle has no regional wall motion abnormalities. There is moderate concentric left ventricular hypertrophy. Left ventricular diastolic parameters are consistent with Grade I diastolic dysfunction (impaired relaxation).  2. Right ventricular systolic function is normal. The right ventricular size is normal. There is normal pulmonary artery  systolic pressure.  3. The mitral valve is normal in structure. Trivial mitral valve regurgitation.  4. The aortic valve is tricuspid. There is mild calcification of the aortic valve. There is mild thickening of the aortic valve. Aortic valve regurgitation is mild. Aortic valve sclerosis/calcification is present, without any  evidence of aortic stenosis.  5. The inferior vena cava is normal in size with greater than 50% respiratory variability, suggesting right atrial pressure of 3 mmHg.  6. Agitated saline contrast bubble study was negative, with no evidence of any interatrial shunt. Comparison(s): Compared to prior TTE in 2020, there is no significant change. Conclusion(s)/Recommendation(s): No intracardiac source of embolism detected on this transthoracic study. Consider a transesophageal echocardiogram to exclude cardiac source of embolism if clinically indicated. FINDINGS  Left Ventricle: Left ventricular ejection fraction, by estimation, is 65 to 70%. The left ventricle has normal function. The left ventricle has no regional wall motion abnormalities. The left ventricular internal cavity size was normal in size. There is  moderate concentric left ventricular hypertrophy. Left ventricular diastolic parameters are consistent with Grade I diastolic dysfunction (impaired relaxation). Right Ventricle: The right ventricular size is normal. No increase in right ventricular wall thickness. Right ventricular systolic function is normal. There is normal pulmonary artery systolic pressure. The tricuspid regurgitant velocity is 1.99 m/s, and  with an assumed right atrial pressure of 3 mmHg, the estimated right ventricular systolic pressure is 123456 mmHg. Left Atrium: Left atrial size was normal in size. Right Atrium: Right atrial size was normal in size. Pericardium: There is no evidence of pericardial effusion. Mitral Valve: The mitral valve is normal in structure. There is mild thickening of the mitral valve leaflet(s).  There is mild calcification of the mitral valve leaflet(s). Mild mitral annular calcification. Trivial mitral valve regurgitation. Tricuspid Valve: The tricuspid valve is normal in structure. Tricuspid valve regurgitation is trivial. Aortic Valve: The aortic valve is tricuspid. There is mild calcification of the aortic valve. There is mild thickening of the aortic valve. Aortic valve regurgitation is mild. Aortic regurgitation PHT measures 404 msec. Aortic valve sclerosis/calcification is present, without any evidence of aortic stenosis. Pulmonic Valve: The pulmonic valve was normal in structure. Pulmonic valve regurgitation is trivial. Aorta: The aortic root is normal in size and structure. Venous: The inferior vena cava is normal in size with greater than 50% respiratory variability, suggesting right atrial pressure of 3 mmHg. IAS/Shunts: The atrial septum is grossly normal. Agitated saline contrast was given intravenously to evaluate for intracardiac shunting. Agitated saline contrast bubble study was negative, with no evidence of any interatrial shunt.  LEFT VENTRICLE PLAX 2D LVIDd:         3.60 cm     Diastology LVIDs:         2.30 cm     LV e' medial:    5.45 cm/s LV PW:         1.40 cm     LV E/e' medial:  17.4 LV IVS:        1.20 cm     LV e' lateral:   6.29 cm/s LVOT diam:     2.00 cm     LV E/e' lateral: 15.1 LV SV:         85 LV SV Index:   45          2D Longitudinal Strain LVOT Area:     3.14 cm    2D Strain GLS Avg:     -24.5 %  LV Volumes (MOD) LV vol d, MOD A2C: 53.6 ml LV vol d, MOD A4C: 70.7 ml LV vol s, MOD A2C: 16.0 ml LV vol s, MOD A4C: 14.8 ml LV SV MOD A2C:     37.6 ml LV SV MOD A4C:     70.7 ml LV SV  MOD BP:      46.2 ml RIGHT VENTRICLE RV S prime:     14.50 cm/s TAPSE (M-mode): 2.0 cm LEFT ATRIUM             Index        RIGHT ATRIUM           Index LA diam:        3.70 cm 1.95 cm/m   RA Area:     14.60 cm LA Vol (A2C):   41.8 ml 22.05 ml/m  RA Volume:   34.30 ml  18.09 ml/m LA Vol  (A4C):   52.8 ml 27.85 ml/m LA Biplane Vol: 48.4 ml 25.53 ml/m  AORTIC VALVE LVOT Vmax:         135.67 cm/s LVOT Vmean:        92.667 cm/s LVOT VTI:          0.269 m AI PHT:            404 msec AR Vena Contracta: 0.20 cm  AORTA Ao Root diam: 3.10 cm Ao Asc diam:  3.60 cm MITRAL VALVE                TRICUSPID VALVE MV Area (PHT): 4.21 cm     TR Peak grad:   15.8 mmHg MV Decel Time: 180 msec     TR Vmax:        199.00 cm/s MV E velocity: 95.10 cm/s MV A velocity: 148.00 cm/s  SHUNTS MV E/A ratio:  0.64         Systemic VTI:  0.27 m                             Systemic Diam: 2.00 cm Gwyndolyn Kaufman MD Electronically signed by Gwyndolyn Kaufman MD Signature Date/Time: 11/06/2021/12:25:48 PM    Final    VAS US CAROTID (at [facility] and [facility] only)  Result Date: 11/07/2021 Carotid Arterial Duplex Study Patient Name:  [name]  Date of Exam:   11/06/2021 Medical Rec #: [id number]       Accession #:    [id number] Date of Birth: 07-16-1934       Patient Gender: F Patient Age:   12 years Exam Location:  [facility] Procedure:      VAS US CAROTID Referring Phys: Eugenie Norrie --------------------------------------------------------------------------------  Indications:       CVA. Risk Factors:      Hypertension, hyperlipidemia, coronary artery disease. Comparison Study:  no prior Performing Technologist: Archie Patten RVS  Examination Guidelines: A complete evaluation includes B-mode imaging, spectral Doppler, color Doppler, and power Doppler as needed of all accessible portions of each vessel. Bilateral testing is considered an integral part of a complete examination. Limited examinations for reoccurring indications may be performed as noted.  Right Carotid Findings: +----------+--------+--------+--------+------------------+--------+             PSV cm/s EDV cm/s Stenosis Plaque Description Comments  +----------+--------+--------+--------+------------------+--------+  CCA Prox   76       7                 heterogenous                  +----------+--------+--------+--------+------------------+--------+  CCA Distal 46       7                 heterogenous                 +----------+--------+--------+--------+------------------+--------+  ICA Prox   70       21       1-39%    heterogenous                 +----------+--------+--------+--------+------------------+--------+  ICA Distal 86       20                                             +----------+--------+--------+--------+------------------+--------+  ECA        88                                                      +----------+--------+--------+--------+------------------+--------+ +----------+--------+-------+--------+-------------------+             PSV cm/s EDV cms Describe Arm Pressure (mmHG)  +----------+--------+-------+--------+-------------------+  Subclavian 67                                             +----------+--------+-------+--------+-------------------+ +---------+--------+--+--------+--+---------+  Vertebral PSV cm/s 59 EDV cm/s 11 Antegrade  +---------+--------+--+--------+--+---------+  Left Carotid Findings: +----------+--------+--------+--------+------------------+--------+             PSV cm/s EDV cm/s Stenosis Plaque Description Comments  +----------+--------+--------+--------+------------------+--------+  CCA Prox   78       15                heterogenous                 +----------+--------+--------+--------+------------------+--------+  CCA Distal 54       9                 heterogenous                 +----------+--------+--------+--------+------------------+--------+  ICA Prox   58       17       1-39%    heterogenous                 +----------+--------+--------+--------+------------------+--------+  ICA Distal 59       16                                             +----------+--------+--------+--------+------------------+--------+  ECA        78                                                       +----------+--------+--------+--------+------------------+--------+ +----------+--------+--------+--------+-------------------+             PSV cm/s EDV cm/s Describe Arm Pressure (mmHG)  +----------+--------+--------+--------+-------------------+  Subclavian 122                                             +----------+--------+--------+--------+-------------------+ +---------+--------+--+--------+--+---------+  Vertebral PSV cm/s 40  EDV cm/s 11 Antegrade  +---------+--------+--+--------+--+---------+   Summary: Right Carotid: Velocities in the right ICA are consistent with a 1-39% stenosis. Left Carotid: Velocities in the left ICA are consistent with a 1-39% stenosis. Vertebrals: Bilateral vertebral arteries demonstrate antegrade flow. *See table(s) above for measurements and observations.  Electronically signed by Antony Contras MD on 11/07/2021 at 9:04:08 AM.    Final     Microbiology: Results for orders placed or performed during the hospital encounter of 11/05/21  Resp Panel by RT-PCR (Flu A&B, Covid) Nasopharyngeal Swab     Status: None   Collection Time: 11/06/21  3:50 AM   Specimen: Nasopharyngeal Swab; Nasopharyngeal(NP) swabs in vial transport medium  Result Value Ref Range Status   SARS Coronavirus 2 by RT PCR NEGATIVE NEGATIVE Final    Comment: (NOTE) SARS-CoV-2 target nucleic acids are NOT DETECTED.  The SARS-CoV-2 RNA is generally detectable in upper respiratory specimens during the acute phase of infection. The lowest concentration of SARS-CoV-2 viral copies this assay can detect is 138 copies/mL. A negative result does not preclude SARS-Cov-2 infection and should not be used as the sole basis for treatment or other patient management decisions. A negative result may occur with  improper specimen collection/handling, submission of specimen other than nasopharyngeal swab, presence of viral mutation(s) within the areas targeted by this assay, and inadequate number of viral copies(<138  copies/mL). A negative result must be combined with clinical observations, patient history, and epidemiological information. The expected result is Negative.  Fact Sheet for Patients:  EntrepreneurPulse.com.au  Fact Sheet for Healthcare Providers:  IncredibleEmployment.be  This test is no t yet approved or cleared by the [place] FDA and  has been authorized for detection and/or diagnosis of SARS-CoV-2 by FDA under an Emergency Use Authorization (EUA). This EUA will remain  in effect (meaning this test can be used) for the duration of the COVID-19 declaration under Section 564(b)(1) of the Act, 21 U.S.C.section 360bbb-3(b)(1), unless the authorization is terminated  or revoked sooner.       Influenza A by PCR NEGATIVE NEGATIVE Final   Influenza B by PCR NEGATIVE NEGATIVE Final    Comment: (NOTE) The Xpert Xpress SARS-CoV-2/FLU/RSV plus assay is intended as an aid in the diagnosis of influenza from Nasopharyngeal swab specimens and should not be used as a sole basis for treatment. Nasal washings and aspirates are unacceptable for Xpert Xpress SARS-CoV-2/FLU/RSV testing.  Fact Sheet for Patients: EntrepreneurPulse.com.au  Fact Sheet for Healthcare Providers: IncredibleEmployment.be  This test is not yet approved or cleared by the [place] FDA and has been authorized for detection and/or diagnosis of SARS-CoV-2 by FDA under an Emergency Use Authorization (EUA). This EUA will remain in effect (meaning this test can be used) for the duration of the COVID-19 declaration under Section 564(b)(1) of the Act, 21 U.S.C. section 360bbb-3(b)(1), unless the authorization is terminated or revoked.  Performed at [facility], [place] [address], [place], [place] [address]     Labs: CBC: Recent Labs  Lab 11/05/21 1742 11/05/21 1748 11/06/21 0359  WBC 6.5  --  4.8  NEUTROABS 3.9  --   --   HGB  13.2 13.6 12.4  HCT 40.0 40.0 37.5  MCV 89.3  --  89.5  PLT 179  --  99991111   Basic Metabolic Panel: Recent Labs  Lab 11/05/21 1742 11/05/21 1748 11/06/21 0359  NA 138 141  --   K 4.1 4.1  --   CL 106 106  --  CO2 25  --   --   GLUCOSE 120* 115*  --   BUN 14 15  --   CREATININE 1.27* 1.20* 1.23*  CALCIUM 9.2  --   --    Liver Function Tests: Recent Labs  Lab 11/05/21 1742  AST 31  ALT 24  ALKPHOS 86  BILITOT 0.6  PROT 6.5  ALBUMIN 3.1*   CBG: No results for input(s): GLUCAP in the last 168 hours.  Discharge time spent: less than 30 minutes.  Signed: Dessa Phi, DO [facility] 11/07/2021

## 2021-11-07 NOTE — TOC Transition Note (Signed)
Transition of Care [facility]) - CM/SW Discharge Note   Patient Details  Name: [name] MRN: [id number] Date of Birth: Feb 12, 1934  Transition of Care [facility]) CM/SW Contact:  Kermit Balo, RN Phone Number: 11/07/2021, 10:41 AM   Clinical Narrative:    Patient is discharging home with home health services through [facility]. Information on the AVS. Walker for home ordered through [facility] and will be delivered to the room.  Pt has 24 hour supervision arranged for the next 2 weeks per family.  Pt denies any issues with home medications or transportation. Daughter providing transport home today.   Final next level of care: Home w Home Health Services Barriers to Discharge: No Barriers Identified   Patient Goals and CMS Choice   CMS Medicare.gov Compare Post Acute Care list provided to:: Patient Choice offered to / list presented to : Patient, Adult Children  Discharge Placement                       Discharge Plan and Services                DME Arranged: Walker rolling DME Agency: [employer] Date DME Agency Contacted: 11/07/21   Representative spoke with at DME Agency: Velna Hatchet HH Arranged: PT, OT [facility] Agency: [facility] Date [facility] Agency Contacted: 11/07/21   Representative spoke with at [facility] Agency: Kandee Keen  Social Determinants of Health (SDOH) Interventions     Readmission Risk Interventions No flowsheet data found.

## 2021-11-08 ENCOUNTER — Telehealth: Payer: Self-pay

## 2021-11-08 ENCOUNTER — Encounter: Payer: Self-pay | Admitting: Cardiology

## 2021-11-08 NOTE — Telephone Encounter (Signed)
See patient message.I reassured her that when in hospital she did not have a-fib while in the ED and that a 30 day monitor was ordered. Her best course of action now is to keep BP under control and remove salt from diet. She is in a lot of pain in her neck from arthritis and was given oxycodone in the hospital. She will see her pcp tomorrow and ask for pain management or PT. She will also f/u with neuro.Daughter was listening to call also. ?

## 2021-11-09 ENCOUNTER — Telehealth: Payer: Self-pay | Admitting: Cardiology

## 2021-11-09 DIAGNOSIS — Z0279 Encounter for issue of other medical certificate: Secondary | ICD-10-CM

## 2021-11-09 DIAGNOSIS — M25511 Pain in right shoulder: Secondary | ICD-10-CM | POA: Diagnosis not present

## 2021-11-09 DIAGNOSIS — Z6830 Body mass index (BMI) 30.0-30.9, adult: Secondary | ICD-10-CM | POA: Diagnosis not present

## 2021-11-09 DIAGNOSIS — M5030 Other cervical disc degeneration, unspecified cervical region: Secondary | ICD-10-CM | POA: Diagnosis not present

## 2021-11-09 DIAGNOSIS — I639 Cerebral infarction, unspecified: Secondary | ICD-10-CM | POA: Diagnosis not present

## 2021-11-09 DIAGNOSIS — K529 Noninfective gastroenteritis and colitis, unspecified: Secondary | ICD-10-CM | POA: Diagnosis not present

## 2021-11-09 DIAGNOSIS — E6609 Other obesity due to excess calories: Secondary | ICD-10-CM | POA: Diagnosis not present

## 2021-11-09 DIAGNOSIS — M19011 Primary osteoarthritis, right shoulder: Secondary | ICD-10-CM | POA: Diagnosis not present

## 2021-11-09 NOTE — Telephone Encounter (Signed)
Forms from [name] for her Job at [employer] received on 11/09/2021. Completed patient authorization attached. Took form to MD box to for completion.  ? [place] 11/09/2021 ?

## 2021-11-15 NOTE — Telephone Encounter (Signed)
For

## 2021-11-15 NOTE — Telephone Encounter (Signed)
Forms completed and faxed to [employer] on 11/15/2021 ?[facility] 11/15/2021 ?

## 2021-11-15 NOTE — Telephone Encounter (Signed)
I spoke with [name]'s daughter [name] and, let her know the forms were completed. [name] asked if I could mail them to her she [name])  had just got out of the hospital. Will go out on 11/16/2021. ?[facility] 11/15/2021 ?

## 2021-11-22 ENCOUNTER — Ambulatory Visit (INDEPENDENT_AMBULATORY_CARE_PROVIDER_SITE_OTHER): Payer: Medicare Other

## 2021-11-22 DIAGNOSIS — I639 Cerebral infarction, unspecified: Secondary | ICD-10-CM

## 2021-11-22 DIAGNOSIS — I4891 Unspecified atrial fibrillation: Secondary | ICD-10-CM | POA: Diagnosis not present

## 2021-11-22 DIAGNOSIS — I452 Bifascicular block: Secondary | ICD-10-CM

## 2021-11-24 ENCOUNTER — Telehealth: Payer: Self-pay

## 2021-11-24 NOTE — Telephone Encounter (Signed)
Received fax from [employer] regarding day 2 of 30 critical monitor report. Report shows SR w/ IVCD/PAC's on 11/23/21 @5 :50:28pm with measurements: ?Rate: 64.9 ?PR 0.20 ?QRS 0.12 ?QT 0.48 ?Qtc 0.50 ? ?Then pt had 8 beats of VT recorded on 11/23/21 @ 5:50:50pm with measurements: ?Rate 151.2 ?PR 0.00 ?QRS 0.10 ?QT 0.22 ?Qtc 0.35 ? ?Called and spoke to patient who states she received a call from [employer] at 7:10pm, but had been sleeping in her chair, she wasn't told that the event had occurred approx. 1 hour earlier. Pt states at time of event (5:50pm) that she was getting out of the car and rushing to go to the bathroom. Pt denies any symptoms. Will route note to [facility] for review by her primary cardiologist and will have monitor fax sent there as well. ? ?

## 2021-11-24 NOTE — Telephone Encounter (Signed)
Noted,will await completion of monitor. ?

## 2021-11-24 NOTE — Telephone Encounter (Signed)
Will forward to Dr. McDowell 

## 2021-11-26 ENCOUNTER — Emergency Department (HOSPITAL_COMMUNITY)
Admission: EM | Admit: 2021-11-26 | Discharge: 2021-11-26 | Disposition: A | Discharge status: Home / Self Care | Payer: Medicare Other | Attending: Emergency Medicine | Admitting: Emergency Medicine

## 2021-11-26 ENCOUNTER — Emergency Department (HOSPITAL_COMMUNITY): Payer: Medicare Other

## 2021-11-26 ENCOUNTER — Encounter (HOSPITAL_COMMUNITY): Payer: Self-pay | Admitting: Emergency Medicine

## 2021-11-26 ENCOUNTER — Other Ambulatory Visit: Payer: Self-pay

## 2021-11-26 DIAGNOSIS — N1832 Chronic kidney disease, stage 3b: Secondary | ICD-10-CM | POA: Insufficient documentation

## 2021-11-26 DIAGNOSIS — I2510 Atherosclerotic heart disease of native coronary artery without angina pectoris: Secondary | ICD-10-CM | POA: Diagnosis not present

## 2021-11-26 DIAGNOSIS — Z20822 Contact with and (suspected) exposure to covid-19: Secondary | ICD-10-CM | POA: Insufficient documentation

## 2021-11-26 DIAGNOSIS — Z7982 Long term (current) use of aspirin: Secondary | ICD-10-CM | POA: Insufficient documentation

## 2021-11-26 DIAGNOSIS — M79604 Pain in right leg: Secondary | ICD-10-CM | POA: Diagnosis not present

## 2021-11-26 DIAGNOSIS — M4802 Spinal stenosis, cervical region: Secondary | ICD-10-CM | POA: Diagnosis not present

## 2021-11-26 DIAGNOSIS — Z7902 Long term (current) use of antithrombotics/antiplatelets: Secondary | ICD-10-CM | POA: Insufficient documentation

## 2021-11-26 DIAGNOSIS — I639 Cerebral infarction, unspecified: Secondary | ICD-10-CM | POA: Diagnosis not present

## 2021-11-26 DIAGNOSIS — G8929 Other chronic pain: Secondary | ICD-10-CM | POA: Insufficient documentation

## 2021-11-26 DIAGNOSIS — Z8673 Personal history of transient ischemic attack (TIA), and cerebral infarction without residual deficits: Secondary | ICD-10-CM | POA: Diagnosis not present

## 2021-11-26 DIAGNOSIS — M47812 Spondylosis without myelopathy or radiculopathy, cervical region: Secondary | ICD-10-CM | POA: Diagnosis not present

## 2021-11-26 DIAGNOSIS — G459 Transient cerebral ischemic attack, unspecified: Secondary | ICD-10-CM | POA: Diagnosis not present

## 2021-11-26 DIAGNOSIS — M542 Cervicalgia: Secondary | ICD-10-CM | POA: Diagnosis not present

## 2021-11-26 DIAGNOSIS — I129 Hypertensive chronic kidney disease with stage 1 through stage 4 chronic kidney disease, or unspecified chronic kidney disease: Secondary | ICD-10-CM | POA: Diagnosis not present

## 2021-11-26 DIAGNOSIS — G5710 Meralgia paresthetica, unspecified lower limb: Secondary | ICD-10-CM

## 2021-11-26 DIAGNOSIS — R200 Anesthesia of skin: Secondary | ICD-10-CM | POA: Diagnosis not present

## 2021-11-26 DIAGNOSIS — M79651 Pain in right thigh: Secondary | ICD-10-CM | POA: Diagnosis not present

## 2021-11-26 DIAGNOSIS — G319 Degenerative disease of nervous system, unspecified: Secondary | ICD-10-CM | POA: Diagnosis not present

## 2021-11-26 DIAGNOSIS — Z79899 Other long term (current) drug therapy: Secondary | ICD-10-CM | POA: Insufficient documentation

## 2021-11-26 DIAGNOSIS — G5711 Meralgia paresthetica, right lower limb: Secondary | ICD-10-CM | POA: Diagnosis not present

## 2021-11-26 DIAGNOSIS — I6782 Cerebral ischemia: Secondary | ICD-10-CM | POA: Diagnosis not present

## 2021-11-26 LAB — CBC
HCT: 41.6 % (ref 36.0–46.0)
Hemoglobin: 13.5 g/dL (ref 12.0–15.0)
MCH: 29.5 pg (ref 26.0–34.0)
MCHC: 32.5 g/dL (ref 30.0–36.0)
MCV: 91 fL (ref 80.0–100.0)
Platelets: 221 10*3/uL (ref 150–400)
RBC: 4.57 MIL/uL (ref 3.87–5.11)
RDW: 14.5 % (ref 11.5–15.5)
WBC: 9.5 10*3/uL (ref 4.0–10.5)
nRBC: 0 % (ref 0.0–0.2)

## 2021-11-26 LAB — URINALYSIS, ROUTINE W REFLEX MICROSCOPIC
Bilirubin Urine: NEGATIVE
Glucose, UA: NEGATIVE mg/dL
Hgb urine dipstick: NEGATIVE
Ketones, ur: NEGATIVE mg/dL
Nitrite: NEGATIVE
Protein, ur: NEGATIVE mg/dL
Specific Gravity, Urine: 1.01 (ref 1.005–1.030)
pH: 6 (ref 5.0–8.0)

## 2021-11-26 LAB — ETHANOL: Alcohol, Ethyl (B): <10 mg/dL (ref <10)

## 2021-11-26 LAB — BASIC METABOLIC PANEL
Anion gap: 5 (ref 5–15)
BUN: 20 mg/dL (ref 8–23)
CO2: 25 mmol/L (ref 22–32)
Calcium: 9.1 mg/dL (ref 8.9–10.3)
Chloride: 107 mmol/L (ref 98–111)
Creatinine, Ser: 1.44 mg/dL — ABNORMAL HIGH (ref 0.44–1.00)
GFR, Estimated: 36 mL/min — ABNORMAL LOW (ref >60)
Glucose, Bld: 108 mg/dL — ABNORMAL HIGH (ref 70–99)
Potassium: 4.5 mmol/L (ref 3.5–5.1)
Sodium: 137 mmol/L (ref 135–145)

## 2021-11-26 LAB — I-STAT CHEM 8, ED
BUN: 24 mg/dL — ABNORMAL HIGH (ref 8–23)
Calcium, Ion: 1.21 mmol/L (ref 1.15–1.40)
Chloride: 106 mmol/L (ref 98–111)
Creatinine, Ser: 1.4 mg/dL — ABNORMAL HIGH (ref 0.44–1.00)
Glucose, Bld: 93 mg/dL (ref 70–99)
HCT: 45 % (ref 36.0–46.0)
Hemoglobin: 15.3 g/dL — ABNORMAL HIGH (ref 12.0–15.0)
Potassium: 4.6 mmol/L (ref 3.5–5.1)
Sodium: 140 mmol/L (ref 135–145)
TCO2: 24 mmol/L (ref 22–32)

## 2021-11-26 LAB — CBG MONITORING, ED: Glucose-Capillary: 94 mg/dL (ref 70–99)

## 2021-11-26 LAB — DIFFERENTIAL
Abs Immature Granulocytes: 0.01 10*3/uL (ref 0.00–0.07)
Basophils Absolute: 0.1 10*3/uL (ref 0.0–0.1)
Basophils Relative: 1 %
Eosinophils Absolute: 0.1 10*3/uL (ref 0.0–0.5)
Eosinophils Relative: 1 %
Immature Granulocytes: 0 %
Lymphocytes Relative: 17 %
Lymphs Abs: 1.4 10*3/uL (ref 0.7–4.0)
Monocytes Absolute: 0.6 10*3/uL (ref 0.1–1.0)
Monocytes Relative: 8 %
Neutro Abs: 6.2 10*3/uL (ref 1.7–7.7)
Neutrophils Relative %: 73 %

## 2021-11-26 LAB — RESP PANEL BY RT-PCR (FLU A&B, COVID) ARPGX2
Influenza A by PCR: NEGATIVE
Influenza B by PCR: NEGATIVE
SARS Coronavirus 2 by RT PCR: NEGATIVE

## 2021-11-26 LAB — PROTIME-INR
INR: 1.1 (ref 0.8–1.2)
Prothrombin Time: 14.4 seconds (ref 11.4–15.2)

## 2021-11-26 LAB — APTT: aPTT: 25 seconds (ref 24–36)

## 2021-11-26 MED ORDER — ACETAMINOPHEN 500 MG PO TABS
1000.0000 mg | ORAL_TABLET | Freq: Once | ORAL | Status: AC
  Administered 2021-11-26: 1000 mg via ORAL
  Filled 2021-11-26: qty 2

## 2021-11-26 MED ORDER — LIDOCAINE 5 % EX PTCH
1.0000 | MEDICATED_PATCH | CUTANEOUS | Status: DC
  Administered 2021-11-26: 1 via TRANSDERMAL
  Filled 2021-11-26: qty 1

## 2021-11-26 MED ORDER — LACTATED RINGERS IV BOLUS
500.0000 mL | Freq: Once | INTRAVENOUS | Status: AC
  Administered 2021-11-26: 500 mL via INTRAVENOUS

## 2021-11-26 MED ORDER — FENTANYL CITRATE PF 50 MCG/ML IJ SOSY
25.0000 ug | PREFILLED_SYRINGE | Freq: Once | INTRAMUSCULAR | Status: AC
  Administered 2021-11-26: 25 ug via INTRAVENOUS
  Filled 2021-11-26: qty 1

## 2021-11-26 NOTE — Discharge Instructions (Signed)
He was seen emergency department for evaluation of leg numbness.  The distribution of her leg numbness appears to be consistent with a compression of the lateral femoral cutaneous nerve also known as meralgia paresthetica.  This is not life-threatening and can be followed up outpatient with your primary care physician.  Your MRIs were reassuringly negative and did not show any evidence of new stroke.  Please return to the emergency department if you have any new numbness, tingling, weakness or other concerning symptoms. ?

## 2021-11-26 NOTE — ED Provider Notes (Signed)
[facility]   CSN: [id number] Arrival date & time: 11/26/21  1034     History  Chief Complaint  Patient presents with   Leg Pain    [name] is a 88 y.o. female.   Leg Pain Associated symptoms: neck pain     88 y.o. African-American female with medical history significant for coronary artery disease status post PCI and stent, hypertension, dyslipidemia, GERD, OSA, CKD stage IIIb and osteoarthritis, who presented to the emergency room with acute onset right lateral thigh pain with associated numbness and a sensation of heaviness.  The patient was last normal when she went to bed last night.  She woke up this morning with symptoms.  She did endorse some heaviness symptoms that started yesterday..  She states that her right-sided numbness is intermittent.  She endorses pain radiating from her right hip down to her lateral right knee.  She denies any back pain.  She denies any recent falls.  She was recently hospitalized for a stroke and started on Plavix.  She also takes a baby aspirin.  She endorses chronic neck pain with some numbness bilaterally down her arms intermittently with intermittent radicular pain.  She denies any saddle anesthesia, bilateral lower extremity weakness, fevers or chills.   Home Medications Prior to Admission medications   Medication Sig Start Date End Date Taking? Authorizing Provider  acetaminophen (TYLENOL) 500 MG tablet Take 500 mg by mouth every 6 (six) hours as needed for mild pain or moderate pain.    [provider]  amLODipine (NORVASC) 5 MG tablet TAKE 1 TABLET BY MOUTH EVERY DAY Patient taking differently: Take 5 mg by mouth daily. 06/13/21   Jonelle SidleMcDowell, Samuel G, MD  aspirin 81 MG tablet Take 81 mg by mouth daily.    [provider]  clopidogrel (PLAVIX) 75 MG tablet Take 1 tablet (75 mg total) by mouth daily for 21 days. 11/07/21 11/28/21  Noralee Stainhoi, Jennifer, DO  fluticasone  (FLONASE) 50 MCG/ACT nasal spray Place 2 sprays into both nostrils daily. 01/28/21   Wurst, GrenadaBrittany, PA-C  hydrALAZINE (APRESOLINE) 50 MG tablet TAKE 1 TABLET BY MOUTH TWICE A DAY Patient taking differently: Take 50 mg by mouth 2 (two) times daily. 04/13/20   Jonelle SidleMcDowell, Samuel G, MD  losartan (COZAAR) 50 MG tablet TAKE 1 TABLET BY MOUTH EVERY DAY Patient taking differently: Take 50 mg by mouth daily. 01/24/21   Jonelle SidleMcDowell, Samuel G, MD  nitroGLYCERIN (NITROSTAT) 0.4 MG SL tablet PLACE 1 TABLET UNDER THE TONGUE EVERY 5 MINUTES AS NEEDED. Patient taking differently: Place 0.4 mg under the tongue every 5 (five) minutes as needed. PLACE 1 TABLET UNDER THE TONGUE EVERY 5 MINUTES AS NEEDED. 10/26/21   Jonelle SidleMcDowell, Samuel G, MD  ondansetron (ZOFRAN) 4 MG tablet Take 4 mg by mouth every 8 (eight) hours as needed for vomiting or nausea. 10/26/21   [provider]  oxyCODONE-acetaminophen (PERCOCET/ROXICET) 5-325 MG tablet Take 1 tablet by mouth every 8 (eight) hours as needed for severe pain.    [provider]  pantoprazole (PROTONIX) 40 MG tablet TAKE 1 TABLET BY MOUTH EVERY DAY Patient taking differently: Take 40 mg by mouth daily. 10/26/21   Jonelle SidleMcDowell, Samuel G, MD  rosuvastatin (CRESTOR) 40 MG tablet TAKE 1 TABLET BY MOUTH EVERY DAY Patient taking differently: Take 40 mg by mouth daily. 10/03/21   Jonelle SidleMcDowell, Samuel G, MD      Allergies    Atorvastatin, Contrast media [iodinated contrast media],  Darvon [propoxyphene], and Codeine    Review of Systems   Review of Systems  Musculoskeletal:  Positive for neck pain.  Neurological:  Positive for weakness and numbness.  All other systems reviewed and are negative.  Physical Exam Updated Vital Signs BP (!) 163/108 (BP Location: Left Arm)   Pulse 76   Temp 98.2 F (36.8 C) (Oral)   Resp 16   SpO2 97%  Physical Exam Vitals and nursing note reviewed.  Constitutional:      General: She is not in acute distress.    Appearance: She is  well-developed. She is obese.  HENT:     Head: Normocephalic and atraumatic.  Eyes:     Conjunctiva/sclera: Conjunctivae normal.  Cardiovascular:     Rate and Rhythm: Normal rate and regular rhythm.     Heart sounds: No murmur heard. Pulmonary:     Effort: Pulmonary effort is normal. No respiratory distress.     Breath sounds: Normal breath sounds.  Abdominal:     Palpations: Abdomen is soft.     Tenderness: There is no abdominal tenderness.  Musculoskeletal:        General: No swelling.     Cervical back: Neck supple.  Skin:    General: Skin is warm and dry.     Capillary Refill: Capillary refill takes less than 2 seconds.  Neurological:     Mental Status: She is alert.     Comments: MENTAL STATUS EXAM:    Orientation: Alert and oriented to person, place and time.  Memory: Cooperative, follows commands well.  Language: Speech is clear and language is normal.   CRANIAL NERVES:    CN 2 (Optic): Visual fields intact to confrontation.  CN 3,4,6 (EOM): Pupils equal and reactive to light. Full extraocular eye movement without nystagmus.  CN 5 (Trigeminal): Facial sensation is normal, no weakness of masticatory muscles.  CN 7 (Facial): No facial weakness or asymmetry.  CN 8 (Auditory): Auditory acuity grossly normal.  CN 9,10 (Glossophar): The uvula is midline, the palate elevates symmetrically.  CN 11 (spinal access): Normal sternocleidomastoid and trapezius strength.  CN 12 (Hypoglossal): The tongue is midline. No atrophy or fasciculations.Marland Kitchen   MOTOR:  Muscle Strength: 5/5RUE, 5/5LUE, 5/5RLE, 5/5LLE.   COORDINATION:   Intact finger-to-nose, no tremor.   SENSATION:   Intact to light touch all four extremities.     Psychiatric:        Mood and Affect: Mood normal.    ED Results / Procedures / Treatments   Labs (all labs ordered are listed, but only abnormal results are displayed) Labs Reviewed  BASIC METABOLIC PANEL - Abnormal; Notable for the following components:       Result Value   Glucose, Bld 108 (*)    Creatinine, Ser 1.44 (*)    GFR, Estimated 36 (*)    All other components within normal limits  CBC  URINALYSIS, ROUTINE W REFLEX MICROSCOPIC  CBG MONITORING, ED  CBG MONITORING, ED    EKG None  Radiology No results found.  Procedures Procedures    Medications Ordered in ED Medications - No data to display  ED Course/ Medical Decision Making/ A&P Clinical Course as of 11/27/21 2148  Sun Nov 26, 2021  1519 MRIs, if negative home [MK]    Clinical Course User Index [MK] Kommor, Wyn Forster, MD                           Medical Decision  Making Amount and/or Complexity of Data Reviewed Labs: ordered. Radiology: ordered.  Risk OTC drugs. Prescription drug management.   88 y.o. African-American female with medical history significant for coronary artery disease status post PCI and stent, hypertension, dyslipidemia, GERD, OSA, CKD stage IIIb and osteoarthritis, who presented to the emergency room with acute onset right lateral thigh pain with associated numbness and a sensation of heaviness.  The patient was last normal when she went to bed last night.  She woke up this morning with symptoms.  She did endorse some heaviness symptoms that started yesterday..  She states that her right-sided numbness is intermittent.  She endorses pain radiating from her right hip down to her lateral right knee.  She denies any back pain.  She denies any recent falls.  She was recently hospitalized for a stroke and started on Plavix.  She also takes a baby aspirin.  She endorses chronic neck pain with some numbness bilaterally down her arms intermittently with intermittent radicular pain.  She denies any saddle anesthesia, bilateral lower extremity weakness, fevers or chills.  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  Differential diagnosis includes new CVA/TIA, stroke recrudescence, electrolyte abnormality, nerve compression, less  clear acute fracture.  Likely acute fracture.  Low concern for cauda equina syndrome at this time.  Given the patient's radicular symptoms in her neck with new weakness and numbness in her right thigh with associated heaviness, we will obtain MRIs of the head and cervical spine and MRA of the head and neck.  CT of the head was performed which revealed no evidence of intracranial hemorrhage or other acute abnormality.  Screening laboratory work-up was ordered and pending, ultimately resulted generally unremarkable with a serum creatinine of 1.44 which is close to her baseline.  Plan at time of signout to follow-up MRI imaging and reassess the patient.  Signout given to Dr. Posey ReaKommor at [phone].   Final Clinical Impression(s) / ED Diagnoses Final diagnoses:  None    Rx / DC Orders ED Discharge Orders     None         Ernie AvenaLawsing, Celine Dishman, MD 11/27/21 2153

## 2021-11-26 NOTE — ED Provider Notes (Signed)
Patient received in handoff.  Lateral thigh numbness.  Pending MRI at time of signout.  After speaking with family, patient was placed in tight leggings overnight as she was really cold and her presentation appears to be consistent with meralgia paresthetica.  Her MRIs are negative.  She is safe for discharge with outpatient follow-up.  Patient to discharge. ?  ?Glendora Score, MD ?11/26/21 1902 ? ?

## 2021-11-26 NOTE — ED Triage Notes (Signed)
Patient complains of right thigh "heaviness" that started sometimes yesterday, reports history of recent stroke that presented as right sided numbness but denies residual symptoms, takes plavix. Patient is a alert, oriented, no dysarthria, no leg drift. ?

## 2021-11-26 NOTE — ED Notes (Signed)
Patient verbalizes understanding of d/c instructions. Opportunities for questions and answers were provided. Pt d/c from ED and wheeled to lobby with family.  

## 2021-12-04 DIAGNOSIS — Z6830 Body mass index (BMI) 30.0-30.9, adult: Secondary | ICD-10-CM | POA: Diagnosis not present

## 2021-12-04 DIAGNOSIS — M19011 Primary osteoarthritis, right shoulder: Secondary | ICD-10-CM | POA: Diagnosis not present

## 2021-12-04 DIAGNOSIS — I639 Cerebral infarction, unspecified: Secondary | ICD-10-CM | POA: Diagnosis not present

## 2021-12-04 DIAGNOSIS — R600 Localized edema: Secondary | ICD-10-CM | POA: Diagnosis not present

## 2021-12-04 DIAGNOSIS — E6609 Other obesity due to excess calories: Secondary | ICD-10-CM | POA: Diagnosis not present

## 2021-12-04 DIAGNOSIS — M5030 Other cervical disc degeneration, unspecified cervical region: Secondary | ICD-10-CM | POA: Diagnosis not present

## 2021-12-13 ENCOUNTER — Encounter: Payer: Self-pay | Admitting: Neurology

## 2021-12-13 ENCOUNTER — Ambulatory Visit (INDEPENDENT_AMBULATORY_CARE_PROVIDER_SITE_OTHER): Payer: Medicare Other | Admitting: Neurology

## 2021-12-13 VITALS — BP 172/78 | HR 78 | Ht 65.0 in | Wt 183.0 lb

## 2021-12-13 DIAGNOSIS — E785 Hyperlipidemia, unspecified: Secondary | ICD-10-CM | POA: Diagnosis not present

## 2021-12-13 DIAGNOSIS — I2510 Atherosclerotic heart disease of native coronary artery without angina pectoris: Secondary | ICD-10-CM | POA: Diagnosis not present

## 2021-12-13 DIAGNOSIS — I10 Essential (primary) hypertension: Secondary | ICD-10-CM

## 2021-12-13 DIAGNOSIS — I639 Cerebral infarction, unspecified: Secondary | ICD-10-CM | POA: Diagnosis not present

## 2021-12-13 NOTE — Patient Instructions (Addendum)
Continue current medication  ?Monitor blood pressure, goal should be below 140 systolic  ?Follow-up with your primary care doctor  ?Return in 1 year for follow-up  ? ?

## 2021-12-13 NOTE — Progress Notes (Signed)
[facility]  PATIENT: [name] DOB: 1934-03-10  REQUESTING CLINICIAN: Noralee Stain, DO HISTORY FROM: Patient and daughter  REASON FOR VISIT: Stroke follow up   HISTORICAL  CHIEF COMPLAINT:  Chief Complaint  Patient presents with   New Patient (Initial Visit)    Room 14 with her daughter, [name]. Hospital follow up from [facility] on 11/05/21. She is able to ambulate without assistance. Uses a walker when out for extended periods of time. Currently wearing a 30-day heart monitor. She declined OT and PT following the stroke. Felt she did not need it. Taking aspirin . No longer on Plavix. Reports left, outer thigh numbess (diagnoses w/ Meralgia paresthetica in ED on 11/26/21).    HISTORY OF PRESENT ILLNESS:  This is a 88 year old woman with past medical history hypertension, hyperlipidemia, GERD, obstructive sleep apnea, CKD, coronary artery disease status post 2 stents who is presenting to clinic after being admitted to the hospital for right lower leg weakness and found to have a left pontine stroke.  Stroke etiology likely small vessel disease but cannot rule out large vessel disease.  She was started on aspirin and Plavix for total of 21 days and plan was to continue with aspirin thereafter.  Since leaving the hospital, she has declined PT, OT because she feels back to her normal strength.  She is currently wearing a cardiac monitor for total of 1 month. A month later after initial admission for stroke, she presented to the hospital for right thigh numbness and was diagnosed with meralgia paresthetica. Hospital course below    Hospital summary and course below "[name] is a 88 y.o. African-American female with medical history significant for coronary artery disease status post PCI and stent, hypertension, dyslipidemia, GERD, OSA, CKD stage IIIb and osteoarthritis, who presented to the emergency room with acute onset of right foot numbness on her bed around  midnight but was able to get off of her bed.  She went to sleep at 10 PM.  When she woke up at 6 AM she felt numbness in her right face as well as right arm, right leg and foot.  Her symptoms have been improving some since 6 AM but not resolved.  She denies any muscle weakness, dysarthria or dysphagia.  No tinnitus or vertigo.  She denied any ataxia and no diplopia.  No urinary or stool incontinence.  No chest pain or palpitations.  She has chronic neck and right shoulder pain.  No fever or chills.  She was seen in the ER for neck and shoulder pain on Saturday 2/18 and placed on steroids and muscle relaxants.  She was also seen on 2/16 for elevated BP that was managed.  ED Course: When she came to the ER, blood pressure was 216/89 with heart rate of 57 with otherwise normal vital signs.  BP later on was 141/69.  Labs revealed a creatinine of 1.2 with otherwise unremarkable CMP.  CBC was within normal. EKG as reviewed by me : Showed normal sinus rhythm with rate of 80 with right bundle branch block and left intrafascicular block (bifascicular block). Imaging: Noncontrast CT scan showed no acute intracranial normalities.  It showed atrophy and chronic microvascular disease.  Brain MRI without contrast revealed small acute infarction of the dorsal left pons with no hemorrhage or mass effect.  It showed findings of chronic small vessel ischemic disease and generalized volume loss.  The patient was given 1 mg of IV Ativan.  She will be admitted to  a medical telemetry bed for further evaluation and management."   Patient was seen by neurology for stroke work-up including MRA, carotid Doppler, echocardiogram, lipid panel, A1c.Marland Kitchen She was started on dual antiplatelet therapy aspirin and Plavix for 3 weeks then to continue aspirin only.  She was seen by PT OT and recommended for home health therapy.  Message sent to cardiology to arrange for 30-day event monitor for A-fib on discharge.  Follow-up with  neurology."    OTHER MEDICAL CONDITIONS: Hypertension, hyperlipidemia, CAD status post stent, obstructive sleep apnea on CPAP, CKD   REVIEW OF SYSTEMS: Full 14 system review of systems performed and negative with exception of: As noted in the HPI  ALLERGIES: Allergies  Allergen Reactions   Atorvastatin Nausea And Vomiting   Contrast Media [Iodinated Contrast Media] Nausea And Vomiting   Darvon [Propoxyphene] Nausea And Vomiting   Codeine Nausea And Vomiting and Anxiety    HOME MEDICATIONS: Outpatient Medications Prior to Visit  Medication Sig Dispense Refill   acetaminophen (TYLENOL) 500 MG tablet Take 500 mg by mouth every 6 (six) hours as needed for mild pain or moderate pain.     amLODipine (NORVASC) 5 MG tablet TAKE 1 TABLET BY MOUTH EVERY DAY 90 tablet 3   aspirin 81 MG tablet Take 81 mg by mouth daily.     cyclobenzaprine (FLEXERIL) 5 MG tablet Take 10 mg by mouth 3 (three) times daily as needed for muscle spasms.     fluticasone (FLONASE) 50 MCG/ACT nasal spray Place 2 sprays into both nostrils daily. 16 g 0   hydrALAZINE (APRESOLINE) 50 MG tablet TAKE 1 TABLET BY MOUTH TWICE A DAY 180 tablet 3   losartan (COZAAR) 50 MG tablet TAKE 1 TABLET BY MOUTH EVERY DAY 90 tablet 3   Menthol, Topical Analgesic, (BIOFREEZE ROLL-ON) 4 % GEL Apply 1 application. topically daily as needed (pain).     Menthol, Topical Analgesic, (BIOFREEZE) 4 % GEL Apply 1 patch topically daily as needed (knee pain).     nitroGLYCERIN (NITROSTAT) 0.4 MG SL tablet PLACE 1 TABLET UNDER THE TONGUE EVERY 5 MINUTES AS NEEDED. 25 tablet 3   ondansetron (ZOFRAN) 4 MG tablet Take 4 mg by mouth every 8 (eight) hours as needed for vomiting or nausea.     oxyCODONE-acetaminophen (PERCOCET/ROXICET) 5-325 MG tablet Take 1 tablet by mouth every 8 (eight) hours as needed for severe pain.     pantoprazole (PROTONIX) 40 MG tablet TAKE 1 TABLET BY MOUTH EVERY DAY 90 tablet 1   rosuvastatin (CRESTOR) 40 MG tablet TAKE 1 TABLET  BY MOUTH EVERY DAY 90 tablet 3   No facility-administered medications prior to visit.    PAST MEDICAL HISTORY: Past Medical History:  Diagnosis Date   Arthritis    CKD (chronic kidney disease), stage III (HCC)    Complete heart block (HCC) 11/16/2016   a. transient during NSTEMI, resolved with revascularization.   Coronary artery disease 11/2009   a. with prior stent placement to the ramus intermedius and RCA. b. NSTEMI 11/2016 s/p DES to DES to distal Cx with moderate residual dz.   CVA (cerebral vascular accident) [facility])    Essential hypertension    Mixed hyperlipidemia    Non-ST elevation (NSTEMI) myocardial infarction (HCC) 11/16/2016   Obesity    Osteoarthritis    Reflux esophagitis    Sleep apnea 09/27/2010   Untreated, REM 64.7/hr AHI 18.5/hr RDI 19.2/hr. Patuent refused CPAP therapy    PAST SURGICAL HISTORY: Past Surgical History:  Procedure Laterality  Date   CHOLECYSTECTOMY     CORONARY ANGIOPLASTY WITH STENT PLACEMENT  2007   A 3.0x26mm CYPHER stent post dilated to 3.29 mm the 100% occlusion was reduced to 0%   CORONARY STENT INTERVENTION N/A 11/16/2016   Procedure: Coronary Stent Intervention;  Surgeon: Tonny Bollman, MD;  Location: [facility] INVASIVE CV LAB;  Service: Cardiovascular;  Laterality: N/A;   LEFT HEART CATH AND CORONARY ANGIOGRAPHY N/A 11/16/2016   Procedure: Left Heart Cath and Coronary Angiography;  Surgeon: Tonny Bollman, MD;  Location: [facility] INVASIVE CV LAB;  Service: Cardiovascular;  Laterality: N/A;   LEFT HEART CATHETERIZATION WITH CORONARY ANGIOGRAM N/A 07/12/2014   Procedure: LEFT HEART CATHETERIZATION WITH CORONARY ANGIOGRAM;  Surgeon: Micheline Chapman, MD;  Location: [facility] CATH LAB;  Service: Cardiovascular;  Laterality: N/A;   TEMPORARY PACEMAKER N/A 11/16/2016   Procedure: Temporary Pacemaker;  Surgeon: Tonny Bollman, MD;  Location: [facility] INVASIVE CV LAB;  Service: Cardiovascular;  Laterality: N/A;    FAMILY HISTORY: Family History  Problem Relation Age of  Onset   Hypertension Mother    Aneurysm Mother        died at age 69   Other Father        unsure of medical history   Hypertension Other     SOCIAL HISTORY: Social History   Socioeconomic History   Marital status: Single    Spouse name: Not on file   Number of children: 9   Years of education: 10th grade   Highest education level: Not on file  Occupational History   Occupation: Retired  Tobacco Use   Smoking status: Never   Smokeless tobacco: Never  Vaping Use   Vaping Use: Never used  Substance and Sexual Activity   Alcohol use: No   Drug use: No   Sexual activity: Never  Other Topics Concern   Not on file  Social History Narrative   Lives alone (children/grandchildren come by daily - sometimes spend the night).   Right-handed.   Nine children (8 living).   Social Determinants of Health   Financial Resource Strain: Not on file  Food Insecurity: Not on file  Transportation Needs: Not on file  Physical Activity: Not on file  Stress: Not on file  Social Connections: Not on file  Intimate Partner Violence: Not on file    PHYSICAL EXAM  GENERAL EXAM/CONSTITUTIONAL: Vitals:  Vitals:   12/13/21 1009  BP: (!) 172/78  Pulse: 78  Weight: 183 lb (83 kg)  Height: 5\' 5"  (1.651 m)   Body mass index is 30.45 kg/m. Wt Readings from Last 3 Encounters:  12/13/21 183 lb (83 kg)  11/06/21 187 lb 6.3 oz (85 kg)  10/26/21 181 lb (82.1 kg)   Patient is in no distress; well developed, nourished and groomed; neck is supple  EYES: Pupils round and reactive to light, Visual fields full to confrontation, Extraocular movements intacts,   MUSCULOSKELETAL: Gait, strength, tone, movements noted in Neurologic exam below  NEUROLOGIC: MENTAL STATUS:      View : No data to display.         awake, alert, oriented to person, place and time recent and remote memory intact normal attention and concentration language fluent, comprehension intact, naming intact fund of  knowledge appropriate  CRANIAL NERVE:  2nd, 3rd, 4th, 6th - pupils equal and reactive to light, visual fields full to confrontation, extraocular muscles intact, no nystagmus 5th - facial sensation symmetric 7th - facial strength symmetric 8th - hearing intact 9th - palate  elevates symmetrically, uvula midline 11th - shoulder shrug symmetric 12th - tongue protrusion midline  MOTOR:  normal bulk and tone, full strength in the BUE, BLE  SENSORY:  normal and symmetric to light touch, pinprick, temperature, vibration  COORDINATION:  finger-nose-finger, fine finger movements normal  REFLEXES:  deep tendon reflexes present and symmetric  GAIT/STATION:  normal   DIAGNOSTIC DATA (LABS, IMAGING, TESTING) - I reviewed patient records, labs, notes, testing and imaging myself where available.  Lab Results  Component Value Date   WBC 9.5 11/26/2021   HGB 15.3 (H) 11/26/2021   HCT 45.0 11/26/2021   MCV 91.0 11/26/2021   PLT 221 11/26/2021      Component Value Date/Time   NA 140 11/26/2021 1308   NA 139 01/19/2015 1048   K 4.6 11/26/2021 1308   CL 106 11/26/2021 1308   CO2 25 11/26/2021 1112   GLUCOSE 93 11/26/2021 1308   BUN 24 (H) 11/26/2021 1308   BUN 14 01/19/2015 1048   CREATININE 1.40 (H) 11/26/2021 1308   CREATININE 0.96 03/02/2014 1057   CALCIUM 9.1 11/26/2021 1112   PROT 6.5 11/05/2021 1742   PROT 6.7 01/19/2015 1048   ALBUMIN 3.1 (L) 11/05/2021 1742   ALBUMIN 3.8 01/19/2015 1048   AST 31 11/05/2021 1742   ALT 24 11/05/2021 1742   ALKPHOS 86 11/05/2021 1742   BILITOT 0.6 11/05/2021 1742   BILITOT 0.3 01/19/2015 1048   GFRNONAA 36 (L) 11/26/2021 1112   GFRAA 37 (L) 06/05/2020 2049   Lab Results  Component Value Date   CHOL 132 11/06/2021   HDL 50 11/06/2021   LDLCALC 67 11/06/2021   TRIG 77 11/06/2021   CHOLHDL 2.6 11/06/2021   Lab Results  Component Value Date   HGBA1C 5.4 11/06/2021   No results found for: [id number] Lab Results  Component Value  Date   TSH 1.437 08/28/2015    MRI: 1. Decreased conspicuity of a subacute left pontine infarct. 2. No evidence of interval/new acute abnormality. 3. Chronic microvascular ischemic disease and atrophy.   MRA head: No large vessel occlusion or proximal hemodynamically significant stenosis.   MRA neck: Unfortunately, the aorta, great vessel origins and proximal common carotid arteries and vertebral arteries were not imaged. Within this limitation and patient motion, the visible common carotid arteries, internal carotid arteries and vertebral arteries are patent without significant (greater than 50%) stenosis    ASSESSMENT AND PLAN  88 y.o. year old female with vascular risk factors including hypertension, hyperlipidemia, coronary artery disease status post 3 stents who is presenting following an admission for left pontine stroke.  Stroke etiology likely small vessel disease but cannot rule out large vessel disease.  She completed 21 days of DAPT and currently she is on aspirin alone, advised patient to continue current medication, her blood pressure goal should be below 140.  Advised her to continue following up with cardiology and to return in 1 year for follow-up.  All other questions answered.   1. Left pontine cerebrovascular accident (HCC)   2. Hypertension, unspecified type   3. Hyperlipidemia, unspecified hyperlipidemia type   4. Coronary artery disease, unspecified vessel or lesion type, unspecified whether angina present, unspecified whether native or transplanted heart      Patient Instructions  Continue current medication  Monitor blood pressure, goal should be below 140 systolic  Follow-up with your primary care doctor  Return in 1 year for follow-up    No orders of the defined types were placed in this encounter.  No orders of the defined types were placed in this encounter.   Return in about 1 year (around 12/14/2022).  I have spent a total of 62 minutes  dedicated to this patient today, preparing to see patient, performing a medically appropriate examination and evaluation, ordering tests and/or medications and procedures, and counseling and educating the patient/family/caregiver; independently interpreting result and communicating results to the family/patient/caregiver; and documenting clinical information in the electronic medical record.   Windell Norfolk, MD 12/13/2021, 11:52 AM  [facility] [address], [address] [place], [place] [address] [phone]

## 2021-12-26 ENCOUNTER — Other Ambulatory Visit: Payer: Self-pay | Admitting: Physician Assistant

## 2021-12-26 DIAGNOSIS — I639 Cerebral infarction, unspecified: Secondary | ICD-10-CM

## 2021-12-26 DIAGNOSIS — I452 Bifascicular block: Secondary | ICD-10-CM

## 2021-12-26 DIAGNOSIS — I4891 Unspecified atrial fibrillation: Secondary | ICD-10-CM

## 2021-12-28 NOTE — Progress Notes (Signed)
Thank you. Results reviewed.

## 2022-01-03 DIAGNOSIS — M19011 Primary osteoarthritis, right shoulder: Secondary | ICD-10-CM | POA: Diagnosis not present

## 2022-01-03 DIAGNOSIS — M25511 Pain in right shoulder: Secondary | ICD-10-CM | POA: Diagnosis not present

## 2022-02-01 ENCOUNTER — Ambulatory Visit
Admission: EM | Admit: 2022-02-01 | Discharge: 2022-02-01 | Disposition: A | Discharge status: Home / Self Care | Payer: Medicare Other | Attending: Family Medicine | Admitting: Family Medicine

## 2022-02-01 ENCOUNTER — Encounter: Payer: Self-pay | Admitting: Emergency Medicine

## 2022-02-01 DIAGNOSIS — M79604 Pain in right leg: Secondary | ICD-10-CM | POA: Diagnosis not present

## 2022-02-01 MED ORDER — CYCLOBENZAPRINE HCL 5 MG PO TABS
5.0000 mg | ORAL_TABLET | Freq: Three times a day (TID) | ORAL | 0 refills | Status: DC | PRN
Start: 2022-02-01 — End: 2022-08-20

## 2022-02-01 MED ORDER — DEXAMETHASONE SODIUM PHOSPHATE 10 MG/ML IJ SOLN
10.0000 mg | Freq: Once | INTRAMUSCULAR | Status: AC
  Administered 2022-02-01: 10 mg via INTRAMUSCULAR

## 2022-02-01 NOTE — ED Triage Notes (Signed)
Right hip pain that radiates down right leg.  Denies injury or fall.  Has tried a pain patch, muscle relaxer without relief.

## 2022-02-05 ENCOUNTER — Other Ambulatory Visit: Payer: Self-pay

## 2022-02-05 ENCOUNTER — Encounter (HOSPITAL_COMMUNITY): Payer: Self-pay | Admitting: *Deleted

## 2022-02-05 ENCOUNTER — Emergency Department (HOSPITAL_COMMUNITY)
Admission: EM | Admit: 2022-02-05 | Discharge: 2022-02-05 | Disposition: A | Discharge status: Home / Self Care | Payer: Medicare Other | Attending: Emergency Medicine | Admitting: Emergency Medicine

## 2022-02-05 DIAGNOSIS — I129 Hypertensive chronic kidney disease with stage 1 through stage 4 chronic kidney disease, or unspecified chronic kidney disease: Secondary | ICD-10-CM | POA: Diagnosis not present

## 2022-02-05 DIAGNOSIS — N189 Chronic kidney disease, unspecified: Secondary | ICD-10-CM | POA: Diagnosis not present

## 2022-02-05 DIAGNOSIS — M5441 Lumbago with sciatica, right side: Secondary | ICD-10-CM | POA: Diagnosis not present

## 2022-02-05 DIAGNOSIS — M549 Dorsalgia, unspecified: Secondary | ICD-10-CM | POA: Diagnosis present

## 2022-02-05 DIAGNOSIS — L304 Erythema intertrigo: Secondary | ICD-10-CM | POA: Diagnosis not present

## 2022-02-05 DIAGNOSIS — I2510 Atherosclerotic heart disease of native coronary artery without angina pectoris: Secondary | ICD-10-CM | POA: Diagnosis not present

## 2022-02-05 LAB — CBG MONITORING, ED: Glucose-Capillary: 83 mg/dL (ref 70–99)

## 2022-02-05 MED ORDER — KETOROLAC TROMETHAMINE 15 MG/ML IJ SOLN
15.0000 mg | Freq: Once | INTRAMUSCULAR | Status: AC
  Administered 2022-02-05: 15 mg via INTRAMUSCULAR
  Filled 2022-02-05: qty 1

## 2022-02-05 MED ORDER — NYSTATIN 100000 UNIT/GM EX CREA: TOPICAL_CREAM | CUTANEOUS | 0 refills | Status: DC

## 2022-02-05 MED ORDER — PREDNISONE 20 MG PO TABS
40.0000 mg | ORAL_TABLET | Freq: Once | ORAL | Status: AC
  Administered 2022-02-05: 40 mg via ORAL
  Filled 2022-02-05: qty 2

## 2022-02-05 MED ORDER — PREDNISONE 20 MG PO TABS: 40.0000 mg | ORAL_TABLET | Freq: Every day | ORAL | 0 refills | Status: DC

## 2022-02-05 MED ORDER — OXYCODONE-ACETAMINOPHEN 5-325 MG PO TABS
1.0000 | ORAL_TABLET | Freq: Once | ORAL | Status: DC
  Filled 2022-02-05: qty 1

## 2022-02-05 NOTE — Discharge Instructions (Addendum)
Please use Tylenol or ibuprofen for pain.  You may use 600 mg ibuprofen every 6 hours or 1000 mg of Tylenol every 6 hours.  You may choose to alternate between the 2.  This would be most effective.  Not to exceed 4 g of Tylenol within 24 hours.  Not to exceed 3200 mg ibuprofen 24 hours.  You can use the muscle relaxants you were prescribed in addition to the above. You can try your at home narcotic pain medication as well. If the pills are solid you can break it in half to try to avoid loopy feeling.  You can use ice / heat to the affected area for up to 15 minutes at a time before breaks.  Please begin the rehab exercises I have provided above.   The groin rash appears consistent with intertrigo as we discussed.  I recommend that you use some over-the-counter antifungal cream twice daily to the affected area as well as a barrier cream such as Desitin or diaper cream to help with friction.  Once the rash is resolved he may want to use a powder such as Goldbond to help keep the skin folds dry and free from yeast colonization.  I did not see any evidence of a shingles-like rash.

## 2022-02-05 NOTE — ED Triage Notes (Signed)
Family member states pt has a rash that hurts with touch to right groin as well.  Pt having a difficult time with ambulation due to the pain.

## 2022-02-05 NOTE — ED Provider Notes (Signed)
[facility] Provider Note   CSN: [id number] Arrival date & time: 02/05/22  1243     History  Chief Complaint  Patient presents with   Leg Pain    [name] is a 88 y.o. female with PMH significant for recent left pontine stroke who presents with concern for right-sided hip, back pain radiating down to the right calf.  She reports the pain is sharp, worse with movement.  She reports it makes it difficult to ambulate.  She was seen by urgent care given a one-time dose of Decadron, muscle relaxant, and told to use a foam roller, reports the pain if anything is worse.  She denies any new numbness, tingling.  Daughter also reports that she has a rash on the right side of the groin, is concerned that she may have shingles.  She denies any new confusion, loss of consciousness, facial droop, other pain in the arms, chest.  She denies any recent fall.   Leg Pain Associated symptoms: back pain       Home Medications Prior to Admission medications   Medication Sig Start Date End Date Taking? Authorizing Provider  acetaminophen (TYLENOL) 500 MG tablet Take 500 mg by mouth every 6 (six) hours as needed for mild pain or moderate pain.   Yes [provider]  amLODipine (NORVASC) 5 MG tablet TAKE 1 TABLET BY MOUTH EVERY DAY 06/13/21  Yes Satira Sark, MD  aspirin 81 MG tablet Take 81 mg by mouth daily.   Yes [provider]  cyclobenzaprine (FLEXERIL) 5 MG tablet Take 1 tablet (5 mg total) by mouth 3 (three) times daily as needed for muscle spasms. Do not drink alcohol or drive while taking this medication.  May cause drowsiness. 02/01/22  Yes Volney American, PA-C  fluticasone [facility]) 50 MCG/ACT nasal spray Place 2 sprays into both nostrils daily. 01/28/21  Yes Wurst, [place], PA-C  furosemide (LASIX) 20 MG tablet Take 20 mg by mouth daily as needed for fluid. 01/05/22  Yes [provider]  hydrALAZINE (APRESOLINE) 50 MG tablet TAKE 1  TABLET BY MOUTH TWICE A DAY 04/13/20  Yes Satira Sark, MD  losartan (COZAAR) 50 MG tablet TAKE 1 TABLET BY MOUTH EVERY DAY 01/24/21  Yes Satira Sark, MD  Menthol, Topical Analgesic, (BIOFREEZE ROLL-ON) 4 % GEL Apply 1 application. topically daily as needed (pain).   Yes [provider]  Menthol, Topical Analgesic, (BIOFREEZE) 4 % GEL Apply 1 patch topically daily as needed (knee pain).   Yes [provider]  nitroGLYCERIN (NITROSTAT) 0.4 MG SL tablet PLACE 1 TABLET UNDER THE TONGUE EVERY 5 MINUTES AS NEEDED. 10/26/21  Yes Satira Sark, MD  nystatin cream (MYCOSTATIN) Apply to affected area 2 times daily 02/05/22  Yes Jefrey Raburn H, PA-C  pantoprazole (PROTONIX) 40 MG tablet TAKE 1 TABLET BY MOUTH EVERY DAY 10/26/21  Yes Satira Sark, MD  predniSONE (DELTASONE) 20 MG tablet Take 2 tablets (40 mg total) by mouth daily. Do not begin taking until 5/30 02/05/22  Yes Takuya Lariccia H, PA-C  rosuvastatin (CRESTOR) 40 MG tablet TAKE 1 TABLET BY MOUTH EVERY DAY 10/03/21  Yes Satira Sark, MD  Vitamin D, Ergocalciferol, (DRISDOL) 1.25 MG (50000 UNIT) CAPS capsule Take 50,000 Units by mouth every 30 (thirty) days. 12/08/21  Yes [provider]  ondansetron (ZOFRAN) 4 MG tablet Take 4 mg by mouth every 8 (eight) hours as needed for vomiting or nausea. Patient not taking:  Reported on 02/05/2022 10/26/21   [provider]  oxyCODONE-acetaminophen (PERCOCET/ROXICET) 5-325 MG tablet Take 1 tablet by mouth every 8 (eight) hours as needed for severe pain. Patient not taking: Reported on 02/05/2022    [provider]      Allergies    Atorvastatin, Contrast media [iodinated contrast media], Darvon [propoxyphene], and Codeine    Review of Systems   Review of Systems  Musculoskeletal:  Positive for back pain and myalgias.  All other systems reviewed and are negative.  Physical Exam Updated Vital Signs BP (!) 153/81 (BP Location: Left  Arm)   Pulse 73   Temp 98.1 F (36.7 C) (Oral)   Resp 16   Ht 5\' 5"  (1.651 m)   Wt 82.6 kg   SpO2 100%   BMI 30.29 kg/m  Physical Exam Vitals and nursing note reviewed.  Constitutional:      General: She is not in acute distress.    Appearance: Normal appearance. She is obese. She is ill-appearing.  HENT:     Head: Normocephalic and atraumatic.  Eyes:     General:        Right eye: No discharge.        Left eye: No discharge.  Cardiovascular:     Rate and Rhythm: Normal rate and regular rhythm.     Heart sounds: No murmur heard.   No friction rub. No gallop.  Pulmonary:     Effort: Pulmonary effort is normal.     Breath sounds: Normal breath sounds.  Abdominal:     General: Bowel sounds are normal.     Palpations: Abdomen is soft.  Musculoskeletal:     Comments: Tenderness to palpation in the right lumbar paraspinous muscles, most focally just over the sacrum.  No step-off or deformity noted.  No midline lumbar, thoracic, cervical tenderness.  Patient with positive straight leg raise on the right.  She has intact strength 5 out of 5 bilateral upper and lower extremities.  Skin:    General: Skin is warm and dry.     Capillary Refill: Capillary refill takes less than 2 seconds.     Comments: In inguinal creases patient with a moist, red rash without papules, macules, vesicles.  It is largely unilateral in nature, however it is present only in skin folds.  Rash is disparate from where patient describes pain.  Neurological:     Mental Status: She is alert and oriented to person, place, and time.     Comments: Patient denies any sensory deficit of the right leg compared to the left.  Psychiatric:        Mood and Affect: Mood normal.        Behavior: Behavior normal.    ED Results / Procedures / Treatments   Labs (all labs ordered are listed, but only abnormal results are displayed) Labs Reviewed  CBG MONITORING, ED    EKG None  Radiology No results  found.  Procedures Procedures    Medications Ordered in ED Medications  oxyCODONE-acetaminophen (PERCOCET/ROXICET) 5-325 MG per tablet 1 tablet (1 tablet Oral Not Given 02/05/22 1400)  ketorolac (TORADOL) 15 MG/ML injection 15 mg (15 mg Intramuscular Given 02/05/22 1357)  predniSONE (DELTASONE) tablet 40 mg (40 mg Oral Given 02/05/22 1536)    ED Course/ Medical Decision Making/ A&P                           Medical Decision Making  This patient  is a 88 y.o. female who presents to the ED for concern of right-sided lower back pain radiating to the buttocks, right knee which is sharp in nature, affecting ability to walk, this involves an extensive number of treatment options, and is a complaint that carries with it a high risk of complications and morbidity. The emergent differential diagnosis prior to evaluation includes, but is not limited to, symptoms related to patient's recent left-sided stroke, sciatic back pain, spinal cord compression injury, spondylolisthesis, hip fracture, other acute fracture, dislocation, with patient's complaint of rash also considered presentation related to shingles or other rash.  Based on the location of the rash considered intertrigo, contact dermatitis, irritant dermatitis versus other.   This is not an exhaustive differential.   Past Medical History / Co-morbidities / Social History: Coronary disease, sleep apnea, CKD, recent CVA, heart block, hypertension, obesity  Additional history: Chart reviewed. Pertinent results include: Recent lab work, imaging from emergency department visits, recent stroke, diagnosis with meralgia paresthetica, outpatient urgent care visits.  Patient's clinical presentation today seems inconsistent with her previous numbness, tingling, weakness.  Physical Exam: Physical exam performed. The pertinent findings include: Patient with no midline spinal tenderness, or tenderness over the bony prominences of the right hip.  She does have  some tenderness of the lumbar paraspinous muscles especially extending down into the sacrum and the lower lumbar distribution.  She has positive straight leg raise on the right.  No evidence of vesicular rash.  Not in dermatomal distribution.  With intertriginous rash.  No evidence of secondary cellulitis infection.  Lab Tests: I ordered, and personally interpreted labs.  The pertinent results include: CBG 83, no signs of hyperglycemia to explain patient's intertriginous rash   Medications: I ordered medication including Toradol, Percocet for pain, patient declined Percocet because it makes her "loopy".  Administered prednisone 40 mg x 1 to begin patient's steroid burst for sciatica related back pain as she is not able to get to the pharmacy today as none of them are open. Reevaluation of the patient after these medicines showed that the patient improved. I have reviewed the patients home medicines and have made adjustments as needed.   Disposition: After consideration of the diagnostic results and the patients response to treatment, I feel that despite advanced age, recent complicated hospital course, recent stroke patient's symptoms today are consistent with simple sciatic nerve inflammation on the right, as well as an intertriginous rash of the inguinal crease.  See no evidence of neurologic deficit, vascular deficit, and patient's pain improved with Toradol x1.  We will discharge with steroid burst, rehab exercises, pain control plan, encouraged follow-up with orthopedics.  Patient discharged in stable condition, return precautions given.   I discussed this case with my attending physician Dr. Sabra Heck who cosigned this note including patient's presenting symptoms, physical exam, and planned diagnostics and interventions. Attending physician stated agreement with plan or made changes to plan which were implemented.    Final Clinical Impression(s) / ED Diagnoses Final diagnoses:  Acute right-sided  low back pain with right-sided sciatica  Intertrigo    Rx / DC Orders ED Discharge Orders          Ordered    nystatin cream (MYCOSTATIN)        02/05/22 1534    predniSONE (DELTASONE) 20 MG tablet  Daily        02/05/22 1534              Maren Wiesen, [place] H, PA-C 02/05/22 1543  Noemi Chapel, MD 02/06/22 [phone]

## 2022-02-05 NOTE — ED Provider Notes (Signed)
[facility]    CSN: [id number] Arrival date & time: 02/01/22  1818      History   Chief Complaint No chief complaint on file.   HPI [name] is a 88 y.o. female.   Presenting today with several day history of right hip pain that radiates down the right leg anteriorly and laterally.  Worse with movement, weightbearing.  Denies any injury, falls, numbness, weakness, tingling, swelling, discoloration, chronic back pain, saddle paresthesias, bowel or bladder incontinence, abdominal pain, nausea vomiting diarrhea constipation.  Has tried pain patch, muscle relaxers with no relief.     Past Medical History:  Diagnosis Date   Arthritis    CKD (chronic kidney disease), stage III ([place])    Complete heart block ([place]) 11/16/2016   a. transient during NSTEMI, resolved with revascularization.   Coronary artery disease 11/2009   a. with prior stent placement to the ramus intermedius and RCA. b. NSTEMI 11/2016 s/p DES to DES to distal Cx with moderate residual dz.   CVA (cerebral vascular accident) [facility])    Essential hypertension    Mixed hyperlipidemia    Non-ST elevation (NSTEMI) myocardial infarction ([place]) 11/16/2016   Obesity    Osteoarthritis    Reflux esophagitis    Sleep apnea 09/27/2010   Untreated, REM 64.7/hr AHI 18.5/hr RDI 19.2/hr. Patuent refused CPAP therapy    Patient Active Problem List   Diagnosis Date Noted   Acute CVA (cerebrovascular accident) ([place]) 11/06/2021   Dyslipidemia 11/06/2021   GERD without esophagitis 11/06/2021   CKD (chronic kidney disease), stage III ([place]) 11/23/2016   Pericardial effusion 11/23/2016   Complete heart block ([facility]) 11/16/2016   Non-ST elevation (NSTEMI) myocardial infarction ([place]) 11/16/2016   Renal insufficiency 11/03/2015   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 09/06/2015   Uncontrolled hypertension 08/31/2015   HTN (hypertension), malignant    Symptomatic bradycardia     Hyperlipidemia    Epigastric fullness    Near syncope 08/28/2015   CAD S/P PCI    Essential hypertension 03/05/2013   Obesity (BMI 30-39.9) 03/05/2013   Hyperlipidemia with target LDL less than 70 03/05/2013   Obstructive sleep apnea 03/05/2013    Past Surgical History:  Procedure Laterality Date   CHOLECYSTECTOMY     CORONARY ANGIOPLASTY WITH STENT PLACEMENT  2007   A 3.0x51mm CYPHER stent post dilated to 3.29 mm the 100% occlusion was reduced to 0%   CORONARY STENT INTERVENTION N/A 11/16/2016   Procedure: Coronary Stent Intervention;  Surgeon: Sherren Mocha, MD;  Location: [place] [facility];  Service: Cardiovascular;  Laterality: N/A;   LEFT HEART CATH AND CORONARY ANGIOGRAPHY N/A 11/16/2016   Procedure: Left Heart Cath and Coronary Angiography;  Surgeon: Sherren Mocha, MD;  Location: [place] [facility];  Service: Cardiovascular;  Laterality: N/A;   LEFT HEART CATHETERIZATION WITH CORONARY ANGIOGRAM N/A 07/12/2014   Procedure: LEFT HEART CATHETERIZATION WITH CORONARY ANGIOGRAM;  Surgeon: Blane Ohara, MD;  Location: [facility] CATH LAB;  Service: Cardiovascular;  Laterality: N/A;   TEMPORARY PACEMAKER N/A 11/16/2016   Procedure: Temporary Pacemaker;  Surgeon: Sherren Mocha, MD;  Location: [place] [facility];  Service: Cardiovascular;  Laterality: N/A;    OB History   No obstetric history on file.      Home Medications    Prior to Admission medications   Medication Sig Start Date End Date Taking? Authorizing Provider  acetaminophen (TYLENOL) 500 MG tablet Take 500 mg by mouth every 6 (six) hours  as needed for mild pain or moderate pain.    [provider]  amLODipine (NORVASC) 5 MG tablet TAKE 1 TABLET BY MOUTH EVERY DAY 06/13/21   Satira Sark, MD  aspirin 81 MG tablet Take 81 mg by mouth daily.    [provider]  cyclobenzaprine (FLEXERIL) 5 MG tablet Take 1 tablet (5 mg total) by mouth 3 (three) times daily as needed for muscle spasms. Do not drink alcohol  or drive while taking this medication.  May cause drowsiness. 02/01/22   Volney American, PA-C  fluticasone [facility]) 50 MCG/ACT nasal spray Place 2 sprays into both nostrils daily. 01/28/21   Wurst, [place], PA-C  furosemide (LASIX) 20 MG tablet Take 20 mg by mouth daily as needed for fluid. 01/05/22   [provider]  hydrALAZINE (APRESOLINE) 50 MG tablet TAKE 1 TABLET BY MOUTH TWICE A DAY 04/13/20   Satira Sark, MD  losartan (COZAAR) 50 MG tablet TAKE 1 TABLET BY MOUTH EVERY DAY 01/24/21   Satira Sark, MD  Menthol, Topical Analgesic, (BIOFREEZE ROLL-ON) 4 % GEL Apply 1 application. topically daily as needed (pain).    [provider]  Menthol, Topical Analgesic, (BIOFREEZE) 4 % GEL Apply 1 patch topically daily as needed (knee pain).    [provider]  nitroGLYCERIN (NITROSTAT) 0.4 MG SL tablet PLACE 1 TABLET UNDER THE TONGUE EVERY 5 MINUTES AS NEEDED. 10/26/21   Satira Sark, MD  nystatin cream (MYCOSTATIN) Apply to affected area 2 times daily 02/05/22   Prosperi, Christian H, PA-C  ondansetron (ZOFRAN) 4 MG tablet Take 4 mg by mouth every 8 (eight) hours as needed for vomiting or nausea. Patient not taking: Reported on 02/05/2022 10/26/21   [provider]  oxyCODONE-acetaminophen (PERCOCET/ROXICET) 5-325 MG tablet Take 1 tablet by mouth every 8 (eight) hours as needed for severe pain. Patient not taking: Reported on 02/05/2022    [provider]  pantoprazole (PROTONIX) 40 MG tablet TAKE 1 TABLET BY MOUTH EVERY DAY 10/26/21   Satira Sark, MD  predniSONE (DELTASONE) 20 MG tablet Take 2 tablets (40 mg total) by mouth daily. Do not begin taking until 5/30 02/05/22   Prosperi, Christian H, PA-C  rosuvastatin (CRESTOR) 40 MG tablet TAKE 1 TABLET BY MOUTH EVERY DAY 10/03/21   Satira Sark, MD  Vitamin D, Ergocalciferol, (DRISDOL) 1.25 MG (50000 UNIT) CAPS capsule Take 50,000 Units by mouth every 30 (thirty) days. 12/08/21    [provider]    Family History Family History  Problem Relation Age of Onset   Hypertension Mother    Aneurysm Mother        died at age 86   Other Father        unsure of medical history   Hypertension Other     Social History Social History   Tobacco Use   Smoking status: Never   Smokeless tobacco: Never  Vaping Use   Vaping Use: Never used  Substance Use Topics   Alcohol use: No   Drug use: No     Allergies   Atorvastatin, Contrast media [iodinated contrast media], Darvon [propoxyphene], and Codeine   Review of Systems Review of Systems Per HPI  Physical Exam Triage Vital Signs ED Triage Vitals [02/01/22 1907]  Enc Vitals Group     BP (!) 163/92     Pulse Rate 72     Resp 18     Temp 97.8 F (36.6 C)  Temp Source Oral     SpO2 97 %     Weight      Height      Head Circumference      Peak Flow      Pain Score 10     Pain Loc      Pain Edu?      Excl. in [place]?    No data found.  Updated Vital Signs BP (!) 163/92 (BP Location: Right Arm)   Pulse 72   Temp 97.8 F (36.6 C) (Oral)   Resp 18   SpO2 97%   Visual Acuity Right Eye Distance:   Left Eye Distance:   Bilateral Distance:    Right Eye Near:   Left Eye Near:    Bilateral Near:     Physical Exam Vitals and nursing note reviewed.  Constitutional:      Appearance: Normal appearance. She is not ill-appearing.  HENT:     Head: Atraumatic.     Mouth/Throat:     Mouth: Mucous membranes are moist.  Eyes:     Extraocular Movements: Extraocular movements intact.     Conjunctiva/sclera: Conjunctivae normal.  Cardiovascular:     Rate and Rhythm: Normal rate and regular rhythm.     Heart sounds: Normal heart sounds.  Pulmonary:     Effort: Pulmonary effort is normal.     Breath sounds: Normal breath sounds.  Abdominal:     General: Bowel sounds are normal. There is no distension.     Palpations: Abdomen is soft.     Tenderness: There is no abdominal tenderness. There  is no right CVA tenderness, left CVA tenderness or guarding.  Musculoskeletal:        General: Tenderness present. No swelling, deformity or signs of injury. Normal range of motion.     Cervical back: Normal range of motion and neck supple.     Comments: Tenderness to palpation right anterior hip and anterior and lateral right leg.  Range of motion intact but painful to do so.  No edema, discoloration No midline spinal tenderness to palpation diffusely.  Negative straight leg raise bilateral lower extremities.  Skin:    General: Skin is warm and dry.     Findings: No bruising or erythema.  Neurological:     Mental Status: She is alert and oriented to person, place, and time.     Comments: Right lower extremity neurovascularly intact  Psychiatric:        Mood and Affect: Mood normal.        Thought Content: Thought content normal.        Judgment: Judgment normal.     UC Treatments / Results  Labs (all labs ordered are listed, but only abnormal results are displayed) Labs Reviewed - No data to display  EKG   Radiology No results found.  Procedures Procedures (including critical care time)  Medications Ordered in UC Medications  dexamethasone (DECADRON) injection 10 mg (10 mg Intramuscular Given 02/01/22 1946)    Initial Impression / Assessment and Plan / UC Course  I have reviewed the triage vital signs and the nursing notes.  Pertinent labs & imaging results that were available during my care of the patient were reviewed by me and considered in my medical decision making (see chart for details).     Treat with IM Decadron, Flexeril, stretches, heat, rest.  No red flag findings on exam today.  Imaging deferred with shared decision making as complaint is consistent with muscular  issue.  PCP follow-up to recheck symptoms.  ED for worsening symptoms.  Final Clinical Impressions(s) / UC Diagnoses   Final diagnoses:  Right leg pain   Discharge Instructions   None     ED Prescriptions     Medication Sig Dispense Auth. Provider   cyclobenzaprine (FLEXERIL) 5 MG tablet Take 1 tablet (5 mg total) by mouth 3 (three) times daily as needed for muscle spasms. Do not drink alcohol or drive while taking this medication.  May cause drowsiness. 15 tablet Volney American, [place]      PDMP not reviewed this encounter.   Volney American, [place] 02/05/22 [phone]

## 2022-02-05 NOTE — ED Triage Notes (Signed)
Pt with c/o cramps to right upper leg/hip last week. Seen at [facility] on Thursday and pt states pain has gotten worse.

## 2022-02-13 ENCOUNTER — Ambulatory Visit: Payer: Medicare Other | Admitting: Orthopedic Surgery

## 2022-02-13 ENCOUNTER — Ambulatory Visit (INDEPENDENT_AMBULATORY_CARE_PROVIDER_SITE_OTHER): Payer: Medicare Other | Admitting: Orthopedic Surgery

## 2022-02-13 ENCOUNTER — Ambulatory Visit (INDEPENDENT_AMBULATORY_CARE_PROVIDER_SITE_OTHER): Payer: Medicare Other

## 2022-02-13 DIAGNOSIS — M5441 Lumbago with sciatica, right side: Secondary | ICD-10-CM | POA: Diagnosis not present

## 2022-02-13 DIAGNOSIS — I639 Cerebral infarction, unspecified: Secondary | ICD-10-CM | POA: Diagnosis not present

## 2022-02-13 MED ORDER — PREDNISONE 10 MG PO TABS: 20.0000 mg | ORAL_TABLET | Freq: Every day | ORAL | 0 refills | Status: DC

## 2022-02-13 MED ORDER — METHOCARBAMOL 500 MG PO TABS: 500.0000 mg | ORAL_TABLET | Freq: Four times a day (QID) | ORAL | 0 refills | Status: DC | PRN

## 2022-02-19 ENCOUNTER — Emergency Department (HOSPITAL_COMMUNITY)
Admission: EM | Admit: 2022-02-19 | Discharge: 2022-02-19 | Disposition: A | Discharge status: Home / Self Care | Payer: Medicare Other | Attending: Emergency Medicine | Admitting: Emergency Medicine

## 2022-02-19 ENCOUNTER — Emergency Department (HOSPITAL_COMMUNITY): Payer: Medicare Other

## 2022-02-19 ENCOUNTER — Other Ambulatory Visit: Payer: Self-pay

## 2022-02-19 ENCOUNTER — Encounter (HOSPITAL_COMMUNITY): Payer: Self-pay | Admitting: Emergency Medicine

## 2022-02-19 DIAGNOSIS — S0990XA Unspecified injury of head, initial encounter: Secondary | ICD-10-CM | POA: Insufficient documentation

## 2022-02-19 DIAGNOSIS — I129 Hypertensive chronic kidney disease with stage 1 through stage 4 chronic kidney disease, or unspecified chronic kidney disease: Secondary | ICD-10-CM | POA: Insufficient documentation

## 2022-02-19 DIAGNOSIS — S92911A Unspecified fracture of right toe(s), initial encounter for closed fracture: Secondary | ICD-10-CM

## 2022-02-19 DIAGNOSIS — Z7982 Long term (current) use of aspirin: Secondary | ICD-10-CM | POA: Insufficient documentation

## 2022-02-19 DIAGNOSIS — S3993XA Unspecified injury of pelvis, initial encounter: Secondary | ICD-10-CM | POA: Diagnosis not present

## 2022-02-19 DIAGNOSIS — N189 Chronic kidney disease, unspecified: Secondary | ICD-10-CM | POA: Diagnosis not present

## 2022-02-19 DIAGNOSIS — S92514A Nondisplaced fracture of proximal phalanx of right lesser toe(s), initial encounter for closed fracture: Secondary | ICD-10-CM | POA: Diagnosis not present

## 2022-02-19 DIAGNOSIS — M79604 Pain in right leg: Secondary | ICD-10-CM | POA: Insufficient documentation

## 2022-02-19 DIAGNOSIS — I2510 Atherosclerotic heart disease of native coronary artery without angina pectoris: Secondary | ICD-10-CM | POA: Insufficient documentation

## 2022-02-19 DIAGNOSIS — W19XXXA Unspecified fall, initial encounter: Secondary | ICD-10-CM | POA: Insufficient documentation

## 2022-02-19 DIAGNOSIS — I672 Cerebral atherosclerosis: Secondary | ICD-10-CM | POA: Diagnosis not present

## 2022-02-19 DIAGNOSIS — S8991XA Unspecified injury of right lower leg, initial encounter: Secondary | ICD-10-CM | POA: Diagnosis not present

## 2022-02-19 DIAGNOSIS — M47816 Spondylosis without myelopathy or radiculopathy, lumbar region: Secondary | ICD-10-CM | POA: Diagnosis not present

## 2022-02-19 DIAGNOSIS — Z79899 Other long term (current) drug therapy: Secondary | ICD-10-CM | POA: Diagnosis not present

## 2022-02-19 DIAGNOSIS — S99921A Unspecified injury of right foot, initial encounter: Secondary | ICD-10-CM | POA: Diagnosis present

## 2022-02-19 DIAGNOSIS — S79921A Unspecified injury of right thigh, initial encounter: Secondary | ICD-10-CM | POA: Diagnosis not present

## 2022-02-19 MED ORDER — ACETAMINOPHEN 500 MG PO TABS
1000.0000 mg | ORAL_TABLET | Freq: Once | ORAL | Status: AC
  Administered 2022-02-19: 1000 mg via ORAL
  Filled 2022-02-19: qty 2

## 2022-02-19 NOTE — ED Provider Notes (Signed)
[facility] EMERGENCY DEPARTMENT Provider Note   CSN: [id number] Arrival date & time: 02/19/22  1327     History  Chief Complaint  Patient presents with   Leg Pain    [name] is a 88 y.o. female.  [name] is a 88 y.o. female with a history of CAD, stroke, CKD, hypertension, hyperlipidemia and sciatica, who presents to the emergency department for evaluation of right leg pain.  This morning when she was coming out of the bathroom she hit her leg while walking.  Did not think much of it at the time and went back to lay down.  Later this afternoon when she was getting up from the table eating lunch she noticed worsening pain in her right leg and this caused her to fall.  She reports that she slid herself to the ground.  Patient is not sure if she hit her head.  She reports of pain extending from the right hip down to the foot.  Reports that she is been treated recently by Dr. Lajoyce Corners for sciatica with pain going into the right leg and symptoms had overall been improving but yesterday she was not feeling good and spent most of the day in bed, and now pain and mobility seem to be worsened again.  [name] and son are at bedside and help to provide history.  The history is provided by the patient and a relative.  Leg Pain Associated symptoms: no fever        Home Medications Prior to Admission medications   Medication Sig Start Date End Date Taking? Authorizing Provider  acetaminophen (TYLENOL) 500 MG tablet Take 500 mg by mouth every 6 (six) hours as needed for mild pain or moderate pain.    [provider]  amLODipine (NORVASC) 5 MG tablet TAKE 1 TABLET BY MOUTH EVERY DAY 06/13/21   Jonelle Sidle, MD  aspirin 81 MG tablet Take 81 mg by mouth daily.    [provider]  cyclobenzaprine (FLEXERIL) 5 MG tablet Take 1 tablet (5 mg total) by mouth 3 (three) times daily as needed for muscle spasms. Do not drink alcohol or drive while taking this  medication.  May cause drowsiness. 02/01/22   Particia Nearing, PA-C  fluticasone [facility]) 50 MCG/ACT nasal spray Place 2 sprays into both nostrils daily. 01/28/21   Wurst, [place], PA-C  furosemide (LASIX) 20 MG tablet Take 20 mg by mouth daily as needed for fluid. 01/05/22   [provider]  hydrALAZINE (APRESOLINE) 50 MG tablet TAKE 1 TABLET BY MOUTH TWICE A DAY 04/13/20   Jonelle Sidle, MD  losartan (COZAAR) 50 MG tablet TAKE 1 TABLET BY MOUTH EVERY DAY 01/24/21   Jonelle Sidle, MD  Menthol, Topical Analgesic, (BIOFREEZE ROLL-ON) 4 % GEL Apply 1 application. topically daily as needed (pain).    [provider]  Menthol, Topical Analgesic, (BIOFREEZE) 4 % GEL Apply 1 patch topically daily as needed (knee pain).    [provider]  methocarbamol (ROBAXIN) 500 MG tablet Take 1 tablet (500 mg total) by mouth every 6 (six) hours as needed for muscle spasms. 02/13/22   Nadara Mustard, MD  nitroGLYCERIN (NITROSTAT) 0.4 MG SL tablet PLACE 1 TABLET UNDER THE TONGUE EVERY 5 MINUTES AS NEEDED. 10/26/21   Jonelle Sidle, MD  nystatin cream (MYCOSTATIN) Apply to affected area 2 times daily 02/05/22   Prosperi, Christian H, PA-C  ondansetron (ZOFRAN) 4 MG tablet Take 4 mg by  mouth every 8 (eight) hours as needed for vomiting or nausea. Patient not taking: Reported on 02/05/2022 10/26/21   [provider]  oxyCODONE-acetaminophen (PERCOCET/ROXICET) 5-325 MG tablet Take 1 tablet by mouth every 8 (eight) hours as needed for severe pain. Patient not taking: Reported on 02/05/2022    [provider]  pantoprazole (PROTONIX) 40 MG tablet TAKE 1 TABLET BY MOUTH EVERY DAY 10/26/21   Jonelle SidleMcDowell, Samuel G, MD  predniSONE (DELTASONE) 10 MG tablet Take 2 tablets (20 mg total) by mouth daily with breakfast. 02/27/22   Nadara Mustarduda, Marcus V, MD  predniSONE (DELTASONE) 20 MG tablet Take 2 tablets (40 mg total) by mouth daily. Do not begin taking until 5/30 02/05/22   Prosperi,  Christian H, PA-C  rosuvastatin (CRESTOR) 40 MG tablet TAKE 1 TABLET BY MOUTH EVERY DAY 10/03/21   Jonelle SidleMcDowell, Samuel G, MD  Vitamin D, Ergocalciferol, (DRISDOL) 1.25 MG (50000 UNIT) CAPS capsule Take 50,000 Units by mouth every 30 (thirty) days. 12/08/21   [provider]      Allergies    Atorvastatin, Contrast media [iodinated contrast media], Darvon [propoxyphene], and Codeine    Review of Systems   Review of Systems  Constitutional:  Negative for chills and fever.  Musculoskeletal:  Positive for arthralgias and myalgias.    Physical Exam Updated Vital Signs BP (!) 143/78 (BP Location: Right Arm)   Pulse 89   Temp 97.6 F (36.4 C) (Oral)   Resp 17   Ht 5\' 5"  (1.651 m)   Wt 82.6 kg   SpO2 100%   BMI 30.30 kg/m  Physical Exam Vitals and nursing note reviewed.  Constitutional:      General: She is not in acute distress.    Appearance: Normal appearance. She is well-developed. She is not ill-appearing or diaphoretic.  HENT:     Head: Normocephalic and atraumatic.     Comments: No obvious step-off, deformity or hematoma, negative battle sign Eyes:     General:        Right eye: No discharge.        Left eye: No discharge.  Neck:     Comments: No midline C-spine tenderness Cardiovascular:     Rate and Rhythm: Normal rate and regular rhythm.  Pulmonary:     Effort: Pulmonary effort is normal. No respiratory distress.     Breath sounds: Normal breath sounds.     Comments: No chest wall tenderness Chest:     Chest wall: No tenderness.  Abdominal:     General: Bowel sounds are normal. There is no distension.     Palpations: Abdomen is soft.     Tenderness: There is no abdominal tenderness. There is no guarding.  Musculoskeletal:     Cervical back: Neck supple.     Comments: Tenderness extending from the right hip down to the foot without obvious deformity, no rotation or shortening.  No focal bony tenderness over the knee or ankle. All compartments soft, no  midline spinal tenderness  Neurological:     Mental Status: She is alert and oriented to person, place, and time.     Coordination: Coordination normal.     Comments: Speech is clear, able to follow commands CN III-XII intact Normal strength in upper and lower extremities bilaterally including dorsiflexion and plantar flexion, strong and equal grip strength Sensation normal to light and sharp touch Moves extremities without ataxia, coordination intact  Psychiatric:        Mood and Affect: Mood normal.  Behavior: Behavior normal.     ED Results / Procedures / Treatments   Labs (all labs ordered are listed, but only abnormal results are displayed) Labs Reviewed - No data to display  EKG None  Radiology  CT Head Wo Contrast  Result Date: 02/19/2022 CLINICAL DATA:  No history of a fall.  Head trauma. EXAM: CT HEAD WITHOUT CONTRAST TECHNIQUE: Contiguous axial images were obtained from the base of the skull through the vertex without intravenous contrast. RADIATION DOSE REDUCTION: This exam was performed according to the departmental dose-optimization program which includes automated exposure control, adjustment of the mA and/or kV according to patient size and/or use of iterative reconstruction technique. COMPARISON:  11/26/2021 FINDINGS: Brain: No evidence of acute infarction, hemorrhage, extra-axial collection, ventriculomegaly, or mass effect. Generalized cerebral atrophy. Periventricular white matter low attenuation likely secondary to microangiopathy. Vascular: Cerebrovascular atherosclerotic calcifications are noted. Skull: Negative for fracture or focal lesion. Sinuses/Orbits: Visualized portions of the orbits are unremarkable. Visualized portions of the paranasal sinuses are unremarkable. Visualized portions of the mastoid air cells are unremarkable. Other: None. IMPRESSION: 1. No acute intracranial findings. 2. Chronic small vessel ischemic changes. Electronically Signed   By:  Elige Ko M.D.   On: 02/19/2022 15:47   DG Knee Complete 4 Views Right  Result Date: 02/19/2022 CLINICAL DATA:  Fall, leg injury EXAM: RIGHT FEMUR 2 VIEWS; RIGHT TIBIA AND FIBULA - 2 VIEW; RIGHT FOOT COMPLETE - 3+ VIEW; RIGHT KNEE - COMPLETE 4+ VIEW COMPARISON:  None Available. FINDINGS: Osteopenia. No fracture or dislocation of the right femur. No fracture or dislocation of the right knee. Severe tricompartmental arthrosis. No knee joint effusion. No fracture or dislocation of the right tibia or fibula. Nondisplaced fractures of the right fourth and fifth proximal phalanges. No other fracture or dislocation of the right foot. Mild midfoot arthrosis with otherwise preserved joint spaces. Vascular calcinosis. IMPRESSION: 1. No fracture or dislocation of the right femur. 2. No fracture or dislocation of the right knee. Severe tricompartmental arthrosis. No knee joint effusion. 3. No fracture or dislocation of the right tibia or fibula. 4. Nondisplaced fractures of the right fourth and fifth proximal phalanges. No other fracture or dislocation of the right foot. Electronically Signed   By: Jearld Lesch M.D.   On: 02/19/2022 15:37   DG Tibia/Fibula Right  Result Date: 02/19/2022 CLINICAL DATA:  Fall, leg injury EXAM: RIGHT FEMUR 2 VIEWS; RIGHT TIBIA AND FIBULA - 2 VIEW; RIGHT FOOT COMPLETE - 3+ VIEW; RIGHT KNEE - COMPLETE 4+ VIEW COMPARISON:  None Available. FINDINGS: Osteopenia. No fracture or dislocation of the right femur. No fracture or dislocation of the right knee. Severe tricompartmental arthrosis. No knee joint effusion. No fracture or dislocation of the right tibia or fibula. Nondisplaced fractures of the right fourth and fifth proximal phalanges. No other fracture or dislocation of the right foot. Mild midfoot arthrosis with otherwise preserved joint spaces. Vascular calcinosis. IMPRESSION: 1. No fracture or dislocation of the right femur. 2. No fracture or dislocation of the right knee. Severe  tricompartmental arthrosis. No knee joint effusion. 3. No fracture or dislocation of the right tibia or fibula. 4. Nondisplaced fractures of the right fourth and fifth proximal phalanges. No other fracture or dislocation of the right foot. Electronically Signed   By: Jearld Lesch M.D.   On: 02/19/2022 15:37   DG Foot Complete Right  Result Date: 02/19/2022 CLINICAL DATA:  Fall, leg injury EXAM: RIGHT FEMUR 2 VIEWS; RIGHT TIBIA AND FIBULA - 2 VIEW;  RIGHT FOOT COMPLETE - 3+ VIEW; RIGHT KNEE - COMPLETE 4+ VIEW COMPARISON:  None Available. FINDINGS: Osteopenia. No fracture or dislocation of the right femur. No fracture or dislocation of the right knee. Severe tricompartmental arthrosis. No knee joint effusion. No fracture or dislocation of the right tibia or fibula. Nondisplaced fractures of the right fourth and fifth proximal phalanges. No other fracture or dislocation of the right foot. Mild midfoot arthrosis with otherwise preserved joint spaces. Vascular calcinosis. IMPRESSION: 1. No fracture or dislocation of the right femur. 2. No fracture or dislocation of the right knee. Severe tricompartmental arthrosis. No knee joint effusion. 3. No fracture or dislocation of the right tibia or fibula. 4. Nondisplaced fractures of the right fourth and fifth proximal phalanges. No other fracture or dislocation of the right foot. Electronically Signed   By: Jearld Lesch M.D.   On: 02/19/2022 15:37   DG FEMUR, MIN 2 VIEWS RIGHT  Result Date: 02/19/2022 CLINICAL DATA:  Fall, leg injury EXAM: RIGHT FEMUR 2 VIEWS; RIGHT TIBIA AND FIBULA - 2 VIEW; RIGHT FOOT COMPLETE - 3+ VIEW; RIGHT KNEE - COMPLETE 4+ VIEW COMPARISON:  None Available. FINDINGS: Osteopenia. No fracture or dislocation of the right femur. No fracture or dislocation of the right knee. Severe tricompartmental arthrosis. No knee joint effusion. No fracture or dislocation of the right tibia or fibula. Nondisplaced fractures of the right fourth and fifth proximal  phalanges. No other fracture or dislocation of the right foot. Mild midfoot arthrosis with otherwise preserved joint spaces. Vascular calcinosis. IMPRESSION: 1. No fracture or dislocation of the right femur. 2. No fracture or dislocation of the right knee. Severe tricompartmental arthrosis. No knee joint effusion. 3. No fracture or dislocation of the right tibia or fibula. 4. Nondisplaced fractures of the right fourth and fifth proximal phalanges. No other fracture or dislocation of the right foot. Electronically Signed   By: Jearld Lesch M.D.   On: 02/19/2022 15:37   DG Pelvis 1-2 Views  Result Date: 02/19/2022 CLINICAL DATA:  Injury to right leg. EXAM: PELVIS - 1-2 VIEW COMPARISON:  None Available. FINDINGS: There is no evidence of pelvic fracture or diastasis. No pelvic bone lesions are seen. Vascular calcifications. Degenerative changes of the lower lumbar spine, partially imaged. IMPRESSION: Negative. Electronically Signed   By: Feliberto Harts M.D.   On: 02/19/2022 15:36     Procedures Procedures    Medications Ordered in ED Medications  acetaminophen (TYLENOL) tablet 1,000 mg (1,000 mg Oral Given 02/19/22 1546)    ED Course/ Medical Decision Making/ A&P                           Medical Decision Making Amount and/or Complexity of Data Reviewed Radiology: ordered.  Risk OTC drugs.   88 y.o. female presents to the ED with complaints of fall, right leg pain, this involves an extensive number of treatment options, and is a complaint that carries with it a high risk of complications and morbidity.  The differential diagnosis includes fracture, dislocation, exacerbation of underlying sciatica (Ddx)  On arrival pt is nontoxic, vitals significant for mild hypertension but otherwise reassuring.   Additional history obtained from daughter and son at bedside. Previous records obtained and reviewed   I ordered medication Tylenol for right leg pain    Imaging Studies ordered:  I  ordered imaging studies which included CT of the head and x-rays of the pelvis, right femur, right knee, right tib-fib and right  foot, I independently visualized and interpreted imaging which showed no intracranial bleeding or skull fracture, x-rays all unremarkable without fracture or dislocation aside from a fracture of the fourth and fifth toes.  ED Course:   Work-up is overall reassuring.  Aside from fractures of the fourth and fifth toes there are no other acute injuries.  Suspect a lot of pain may be exacerbation to underlying sciatica that patient is currently being treated for.  Patient placed in postop shoe to provide stability and pain relief from fourth and fifth toe fractures.  After this patient was able to ambulate with her walker in the hallway.  Do not feel further imaging is indicated at this time.  Recommend patient continue with treatments for her sciatica and follow-up with Dr. Lajoyce Cornersuda as planned.  Patient and family expressed understanding and agreement.  Portions of this note were generated with Scientist, clinical (histocompatibility and immunogenetics)Dragon dictation software. Dictation errors may occur despite best attempts at proofreading.         Final Clinical Impression(s) / ED Diagnoses Final diagnoses:  Fall, initial encounter  Closed fracture of multiple phalanges of toe of right foot, initial encounter  Right leg pain    Rx / DC Orders ED Discharge Orders          Ordered    Walker rolling        02/19/22 1747              Dartha LodgeFord, Couper Juncaj N, PA-C 03/05/22 1326    Cheryll CockayneHong, Joshua S, MD 03/10/22 0900

## 2022-02-19 NOTE — Discharge Instructions (Signed)
You have fractured the fourth and fifth toe on your right foot, use postop shoe to provide support and comfort when walking, you can remove this when you are sleeping or to bathe.  You can follow-up with Dr. Lajoyce Corners regarding these toe fractures.  These usually heal fine on their own.  You can use Tylenol as needed for pain and you continue using the medications Dr. Lajoyce Corners prescribed for sciatica.  The rest of your x-rays and imaging were reassuring.  You do have significant arthritis which may be flared up from your fall today.

## 2022-02-19 NOTE — ED Triage Notes (Signed)
Pt having right leg pain from striking leg this morning, right leg previously weak from sciatica and stroke.

## 2022-02-19 NOTE — ED Notes (Signed)
Patient ambulated with walker short distance with great difficulty.

## 2022-02-27 ENCOUNTER — Ambulatory Visit (INDEPENDENT_AMBULATORY_CARE_PROVIDER_SITE_OTHER): Payer: Medicare Other | Admitting: Orthopedic Surgery

## 2022-02-27 ENCOUNTER — Encounter: Payer: Self-pay | Admitting: Orthopedic Surgery

## 2022-02-27 DIAGNOSIS — M5441 Lumbago with sciatica, right side: Secondary | ICD-10-CM | POA: Diagnosis not present

## 2022-02-27 DIAGNOSIS — I639 Cerebral infarction, unspecified: Secondary | ICD-10-CM | POA: Diagnosis not present

## 2022-02-27 DIAGNOSIS — S92501A Displaced unspecified fracture of right lesser toe(s), initial encounter for closed fracture: Secondary | ICD-10-CM

## 2022-02-27 MED ORDER — PREDNISONE 10 MG PO TABS: 20.0000 mg | ORAL_TABLET | Freq: Every day | ORAL | 0 refills | Status: DC

## 2022-02-27 NOTE — Progress Notes (Signed)
Office Visit Note   Patient: [name]           Date of Birth: [birth year]           MRN: [id number] Visit Date: 02/27/2022              Requested by: Shawnie Dapper, PA-C [address] [place],  [place] [address] PCP: Assunta Found, MD  Chief Complaint  Patient presents with   Lower Back - Follow-up      HPI: Patient is an 88 year old woman who presents for 2 separate issues.  Patient has had right-sided radicular symptoms she did not have sufficient relief with prednisone 10 mg with breakfast but is doing much better with prednisone 20 mg with breakfast.  Patient recently got her foot tangled stumbled and fell sustaining fractures to the lesser toes of the right foot.  Patient is currently in a postoperative shoe.  Assessment & Plan: Visit Diagnoses:  1. Acute right-sided low back pain with right-sided sciatica   2. Closed fracture of phalanx of right fifth toe, initial encounter     Plan: Weightbearing as tolerated in the right lower extremity refill prescription for prednisone 20 mg with breakfast.  Follow-Up Instructions: Return in about 4 weeks (around 03/27/2022).   Ortho Exam  Patient is alert, oriented, no adenopathy, well-dressed, normal affect, normal respiratory effort. Examination patient has clawing of the second toe of the right foot with fractures of toes 3 4 and 5 on the right foot.  Her radicular symptoms in the right lower extremity are much improved she has ecchymosis and bruising in the right heel and right foot.  Imaging: No results found. No images are attached to the encounter.  Labs: Lab Results  Component Value Date   HGBA1C 5.4 11/06/2021   HGBA1C 6.0 (H) 03/02/2014   REPTSTATUS 09/27/2018 FINAL 09/26/2018   CULT  09/26/2018    NO GROWTH Performed at [facility], [address], [place], [place] [address]    [facility] ESCHERICHIA COLI 12/01/2011   LABORGA KLEBSIELLA PNEUMONIAE 12/01/2011     Lab Results  Component  Value Date   ALBUMIN 3.1 (L) 11/05/2021   ALBUMIN 3.5 05/14/2019   ALBUMIN 3.4 (L) 09/26/2018    No results found for: "MG" No results found for: "VD25OH"  No results found for: "PREALBUMIN"    Latest Ref Rng & Units 11/26/2021    1:08 PM 11/26/2021   12:24 PM 11/26/2021   11:12 AM  CBC EXTENDED  WBC 4.0 - 10.5 K/uL   9.5   RBC 3.87 - 5.11 MIL/uL   4.57   Hemoglobin 12.0 - 15.0 g/dL 94.7   09.6   HCT 28.3 - 46.0 % 45.0   41.6   Platelets 150 - 400 K/uL   221   NEUT# 1.7 - 7.7 K/uL  6.2    Lymph# 0.7 - 4.0 K/uL  1.4       There is no height or weight on file to calculate BMI.  Orders:  No orders of the defined types were placed in this encounter.  No orders of the defined types were placed in this encounter.    Procedures: No procedures performed  Clinical Data: No additional findings.  ROS:  All other systems negative, except as noted in the HPI. Review of Systems  Objective: Vital Signs: There were no vitals taken for this visit.  Specialty Comments:  No specialty comments available.  PMFS History: Patient Active Problem List  Diagnosis Date Noted   Acute CVA (cerebrovascular accident) (HCC) 11/06/2021   Dyslipidemia 11/06/2021   GERD without esophagitis 11/06/2021   CKD (chronic kidney disease), stage III (HCC) 11/23/2016   Pericardial effusion 11/23/2016   Complete heart block (HCC) 11/16/2016   Non-ST elevation (NSTEMI) myocardial infarction (HCC) 11/16/2016   Renal insufficiency 11/03/2015   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 09/06/2015   Uncontrolled hypertension 08/31/2015   HTN (hypertension), malignant    Symptomatic bradycardia    Hyperlipidemia    Epigastric fullness    Near syncope 08/28/2015   CAD S/P PCI    Essential hypertension 03/05/2013   Obesity (BMI 30-39.9) 03/05/2013   Hyperlipidemia with target LDL less than 70 03/05/2013   Obstructive sleep apnea 03/05/2013   Past Medical History:   Diagnosis Date   Arthritis    CKD (chronic kidney disease), stage III (HCC)    Complete heart block (HCC) 11/16/2016   a. transient during NSTEMI, resolved with revascularization.   Coronary artery disease 11/2009   a. with prior stent placement to the ramus intermedius and RCA. b. NSTEMI 11/2016 s/p DES to DES to distal Cx with moderate residual dz.   CVA (cerebral vascular accident) [facility])    Essential hypertension    Mixed hyperlipidemia    Non-ST elevation (NSTEMI) myocardial infarction (HCC) 11/16/2016   Obesity    Osteoarthritis    Reflux esophagitis    Sleep apnea 09/27/2010   Untreated, REM 64.7/hr AHI 18.5/hr RDI 19.2/hr. Patuent refused CPAP therapy    Family History  Problem Relation Age of Onset   Hypertension Mother    Aneurysm Mother        died at age 46   Other Father        unsure of medical history   Hypertension Other     Past Surgical History:  Procedure Laterality Date   CHOLECYSTECTOMY     CORONARY ANGIOPLASTY WITH STENT PLACEMENT  2007   A 3.0x41mm CYPHER stent post dilated to 3.29 mm the 100% occlusion was reduced to 0%   CORONARY STENT INTERVENTION N/A 11/16/2016   Procedure: Coronary Stent Intervention;  Surgeon: Tonny Bollman, MD;  Location: [facility] INVASIVE CV LAB;  Service: Cardiovascular;  Laterality: N/A;   LEFT HEART CATH AND CORONARY ANGIOGRAPHY N/A 11/16/2016   Procedure: Left Heart Cath and Coronary Angiography;  Surgeon: Tonny Bollman, MD;  Location: [facility] INVASIVE CV LAB;  Service: Cardiovascular;  Laterality: N/A;   LEFT HEART CATHETERIZATION WITH CORONARY ANGIOGRAM N/A 07/12/2014   Procedure: LEFT HEART CATHETERIZATION WITH CORONARY ANGIOGRAM;  Surgeon: Micheline Chapman, MD;  Location: [facility] CATH LAB;  Service: Cardiovascular;  Laterality: N/A;   TEMPORARY PACEMAKER N/A 11/16/2016   Procedure: Temporary Pacemaker;  Surgeon: Tonny Bollman, MD;  Location: [facility] INVASIVE CV LAB;  Service: Cardiovascular;  Laterality: N/A;   Social History   Occupational  History   Occupation: Retired  Tobacco Use   Smoking status: Never   Smokeless tobacco: Never  Vaping Use   Vaping Use: Never used  Substance and Sexual Activity   Alcohol use: No   Drug use: No   Sexual activity: Never

## 2022-02-27 NOTE — Progress Notes (Signed)
Office Visit Note   Patient: [name]           Date of Birth: 1934-07-31           MRN: [id number] Visit Date: 02/13/2022              Requested by: Shawnie Dapper, PA-C [address] [place],  [place] [address] PCP: Assunta Found, MD  Chief Complaint  Patient presents with   Lower Back - Pain      HPI: Patient is an 88 year old woman who is seen with right-sided radicular symptoms and lower back pain without specific injury.  Patient initially went to the emergency room on May 29.  Patient complains of pain with ambulation sharp radicular pain worse with movement.  Patient has tried muscle relaxant pain medicine and prednisone.  Assessment & Plan: Visit Diagnoses:  1. Acute right-sided low back pain with right-sided sciatica     Plan: We will call in a prescription for both Robaxin and prednisone 20 mg a day.  Follow-Up Instructions: Return in about 2 weeks (around 02/27/2022).   Ortho Exam  Patient is alert, oriented, no adenopathy, well-dressed, normal affect, normal respiratory effort. Examination patient has a positive straight leg raise on the right.  No pain with range of motion of the hip knee or ankle.  Motor strength is symmetric and 5/5 without focal weakness.  Imaging: No results found. No images are attached to the encounter.  Labs: Lab Results  Component Value Date   HGBA1C 5.4 11/06/2021   HGBA1C 6.0 (H) 03/02/2014   REPTSTATUS 09/27/2018 FINAL 09/26/2018   CULT  09/26/2018    NO GROWTH Performed at [facility], [address], [place], [place] [address]    [facility] ESCHERICHIA COLI 12/01/2011   LABORGA KLEBSIELLA PNEUMONIAE 12/01/2011     Lab Results  Component Value Date   ALBUMIN 3.1 (L) 11/05/2021   ALBUMIN 3.5 05/14/2019   ALBUMIN 3.4 (L) 09/26/2018    No results found for: "MG" No results found for: "VD25OH"  No results found for: "PREALBUMIN"    Latest Ref Rng & Units 11/26/2021    1:08 PM 11/26/2021   12:24 PM  11/26/2021   11:12 AM  CBC EXTENDED  WBC 4.0 - 10.5 K/uL   9.5   RBC 3.87 - 5.11 MIL/uL   4.57   Hemoglobin 12.0 - 15.0 g/dL 46.9   62.9   HCT 52.8 - 46.0 % 45.0   41.6   Platelets 150 - 400 K/uL   221   NEUT# 1.7 - 7.7 K/uL  6.2    Lymph# 0.7 - 4.0 K/uL  1.4       There is no height or weight on file to calculate BMI.  Orders:  Orders Placed This Encounter  Procedures   XR Lumbar Spine 2-3 Views   Meds ordered this encounter  Medications   methocarbamol (ROBAXIN) 500 MG tablet    Sig: Take 1 tablet (500 mg total) by mouth every 6 (six) hours as needed for muscle spasms.    Dispense:  30 tablet    Refill:  0   predniSONE (DELTASONE) 10 MG tablet    Sig: Take 2 tablets (20 mg total) by mouth daily with breakfast.    Dispense:  60 tablet    Refill:  0     Procedures: No procedures performed  Clinical Data: No additional findings.  ROS:  All other systems negative, except as noted in the HPI. Review  of Systems  Objective: Vital Signs: There were no vitals taken for this visit.  Specialty Comments:  No specialty comments available.  PMFS History: Patient Active Problem List   Diagnosis Date Noted   Acute CVA (cerebrovascular accident) (HCC) 11/06/2021   Dyslipidemia 11/06/2021   GERD without esophagitis 11/06/2021   CKD (chronic kidney disease), stage III (HCC) 11/23/2016   Pericardial effusion 11/23/2016   Complete heart block (HCC) 11/16/2016   Non-ST elevation (NSTEMI) myocardial infarction (HCC) 11/16/2016   Renal insufficiency 11/03/2015   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 09/06/2015   Uncontrolled hypertension 08/31/2015   HTN (hypertension), malignant    Symptomatic bradycardia    Hyperlipidemia    Epigastric fullness    Near syncope 08/28/2015   CAD S/P PCI    Essential hypertension 03/05/2013   Obesity (BMI 30-39.9) 03/05/2013   Hyperlipidemia with target LDL less than 70 03/05/2013   Obstructive sleep apnea  03/05/2013   Past Medical History:  Diagnosis Date   Arthritis    CKD (chronic kidney disease), stage III (HCC)    Complete heart block (HCC) 11/16/2016   a. transient during NSTEMI, resolved with revascularization.   Coronary artery disease 11/2009   a. with prior stent placement to the ramus intermedius and RCA. b. NSTEMI 11/2016 s/p DES to DES to distal Cx with moderate residual dz.   CVA (cerebral vascular accident) [facility])    Essential hypertension    Mixed hyperlipidemia    Non-ST elevation (NSTEMI) myocardial infarction (HCC) 11/16/2016   Obesity    Osteoarthritis    Reflux esophagitis    Sleep apnea 09/27/2010   Untreated, REM 64.7/hr AHI 18.5/hr RDI 19.2/hr. Patuent refused CPAP therapy    Family History  Problem Relation Age of Onset   Hypertension Mother    Aneurysm Mother        died at age 49   Other Father        unsure of medical history   Hypertension Other     Past Surgical History:  Procedure Laterality Date   CHOLECYSTECTOMY     CORONARY ANGIOPLASTY WITH STENT PLACEMENT  2007   A 3.0x60mm CYPHER stent post dilated to 3.29 mm the 100% occlusion was reduced to 0%   CORONARY STENT INTERVENTION N/A 11/16/2016   Procedure: Coronary Stent Intervention;  Surgeon: Tonny Bollman, MD;  Location: [facility] INVASIVE CV LAB;  Service: Cardiovascular;  Laterality: N/A;   LEFT HEART CATH AND CORONARY ANGIOGRAPHY N/A 11/16/2016   Procedure: Left Heart Cath and Coronary Angiography;  Surgeon: Tonny Bollman, MD;  Location: [facility] INVASIVE CV LAB;  Service: Cardiovascular;  Laterality: N/A;   LEFT HEART CATHETERIZATION WITH CORONARY ANGIOGRAM N/A 07/12/2014   Procedure: LEFT HEART CATHETERIZATION WITH CORONARY ANGIOGRAM;  Surgeon: Micheline Chapman, MD;  Location: [facility] CATH LAB;  Service: Cardiovascular;  Laterality: N/A;   TEMPORARY PACEMAKER N/A 11/16/2016   Procedure: Temporary Pacemaker;  Surgeon: Tonny Bollman, MD;  Location: [facility] INVASIVE CV LAB;  Service: Cardiovascular;  Laterality: N/A;    Social History   Occupational History   Occupation: Retired  Tobacco Use   Smoking status: Never   Smokeless tobacco: Never  Vaping Use   Vaping Use: Never used  Substance and Sexual Activity   Alcohol use: No   Drug use: No   Sexual activity: Never

## 2022-03-07 ENCOUNTER — Other Ambulatory Visit: Payer: Self-pay | Admitting: Orthopedic Surgery

## 2022-03-07 MED ORDER — METHOCARBAMOL 500 MG PO TABS: 500.0000 mg | ORAL_TABLET | Freq: Four times a day (QID) | ORAL | 0 refills | Status: DC | PRN

## 2022-03-19 DIAGNOSIS — M5441 Lumbago with sciatica, right side: Secondary | ICD-10-CM | POA: Diagnosis not present

## 2022-03-27 ENCOUNTER — Ambulatory Visit: Payer: Medicare Other | Admitting: Orthopedic Surgery

## 2022-03-27 DIAGNOSIS — M5441 Lumbago with sciatica, right side: Secondary | ICD-10-CM | POA: Diagnosis not present

## 2022-03-27 DIAGNOSIS — M25651 Stiffness of right hip, not elsewhere classified: Secondary | ICD-10-CM | POA: Diagnosis not present

## 2022-03-27 DIAGNOSIS — M25551 Pain in right hip: Secondary | ICD-10-CM | POA: Diagnosis not present

## 2022-03-27 DIAGNOSIS — M5431 Sciatica, right side: Secondary | ICD-10-CM | POA: Diagnosis not present

## 2022-03-27 DIAGNOSIS — R262 Difficulty in walking, not elsewhere classified: Secondary | ICD-10-CM | POA: Diagnosis not present

## 2022-03-29 DIAGNOSIS — M5441 Lumbago with sciatica, right side: Secondary | ICD-10-CM | POA: Diagnosis not present

## 2022-03-29 DIAGNOSIS — M25651 Stiffness of right hip, not elsewhere classified: Secondary | ICD-10-CM | POA: Diagnosis not present

## 2022-03-29 DIAGNOSIS — R262 Difficulty in walking, not elsewhere classified: Secondary | ICD-10-CM | POA: Diagnosis not present

## 2022-03-29 DIAGNOSIS — M25551 Pain in right hip: Secondary | ICD-10-CM | POA: Diagnosis not present

## 2022-03-29 DIAGNOSIS — M5431 Sciatica, right side: Secondary | ICD-10-CM | POA: Diagnosis not present

## 2022-04-04 ENCOUNTER — Other Ambulatory Visit: Payer: Self-pay | Admitting: Cardiology

## 2022-04-04 DIAGNOSIS — M25651 Stiffness of right hip, not elsewhere classified: Secondary | ICD-10-CM | POA: Diagnosis not present

## 2022-04-04 DIAGNOSIS — M5431 Sciatica, right side: Secondary | ICD-10-CM | POA: Diagnosis not present

## 2022-04-04 DIAGNOSIS — R262 Difficulty in walking, not elsewhere classified: Secondary | ICD-10-CM | POA: Diagnosis not present

## 2022-04-04 DIAGNOSIS — M5441 Lumbago with sciatica, right side: Secondary | ICD-10-CM | POA: Diagnosis not present

## 2022-04-04 DIAGNOSIS — M25551 Pain in right hip: Secondary | ICD-10-CM | POA: Diagnosis not present

## 2022-04-06 DIAGNOSIS — M5431 Sciatica, right side: Secondary | ICD-10-CM | POA: Diagnosis not present

## 2022-04-06 DIAGNOSIS — M25651 Stiffness of right hip, not elsewhere classified: Secondary | ICD-10-CM | POA: Diagnosis not present

## 2022-04-06 DIAGNOSIS — R262 Difficulty in walking, not elsewhere classified: Secondary | ICD-10-CM | POA: Diagnosis not present

## 2022-04-06 DIAGNOSIS — M5441 Lumbago with sciatica, right side: Secondary | ICD-10-CM | POA: Diagnosis not present

## 2022-04-06 DIAGNOSIS — M25551 Pain in right hip: Secondary | ICD-10-CM | POA: Diagnosis not present

## 2022-04-10 ENCOUNTER — Encounter: Payer: Self-pay | Admitting: Orthopedic Surgery

## 2022-04-10 ENCOUNTER — Ambulatory Visit (INDEPENDENT_AMBULATORY_CARE_PROVIDER_SITE_OTHER): Payer: Medicare Other | Admitting: Orthopedic Surgery

## 2022-04-10 DIAGNOSIS — I639 Cerebral infarction, unspecified: Secondary | ICD-10-CM | POA: Diagnosis not present

## 2022-04-10 DIAGNOSIS — M5441 Lumbago with sciatica, right side: Secondary | ICD-10-CM | POA: Diagnosis not present

## 2022-04-10 NOTE — Progress Notes (Signed)
Office Visit Note   Patient: [name]           Date of Birth: May 29, 1934           MRN: [id number] Visit Date: 04/10/2022              Requested by: [name] Found, MD [address] [place],  [place] [address] PCP: [name] Found, MD  Chief Complaint  Patient presents with   Lower Back - Pain   Right Foot - Follow-up    5th toe fx       HPI: Patient is an 88 year old woman with right-sided sciatic symptoms radiating from her lumbar spine.  Patient has been on prednisone with breakfast with some mild relief.  She has been going to physical therapy.  Patient did not want to try the Neurontin secondary to its listed side effects.  Assessment & Plan: Visit Diagnoses:  1. Acute right-sided low back pain with right-sided sciatica     Plan: We will set up an MRI scan of the lumbar spine.  Follow-up with [name] after the scan is obtained.  Follow-Up Instructions: Return if symptoms worsen or fail to improve, for Follow-up with [name] after MRI scan.[name] Exam  Patient is alert, oriented, no adenopathy, well-dressed, normal affect, normal respiratory effort. Examination patient has a good dorsalis pedis and posterior tibial pulse there is no malalignment of her toes no interdigital ulcers.  She has right-sided radicular pain with a negative straight leg raise no focal motor weakness.  Imaging: No results Johnson. No images are attached to the encounter.  Labs: Lab Results  Component Value Date   HGBA1C 5.4 11/06/2021   HGBA1C 6.0 (H) 03/02/2014   REPTSTATUS 09/27/2018 FINAL 09/26/2018   CULT  09/26/2018    NO GROWTH Performed at [facility], [address] [place], [place] [address]    [facility] ESCHERICHIA COLI 12/01/2011   LABORGA KLEBSIELLA PNEUMONIAE 12/01/2011     Lab Results  Component Value Date   ALBUMIN 3.1 (L) 11/05/2021   ALBUMIN 3.5 05/14/2019   ALBUMIN 3.4 (L) 09/26/2018    No results Johnson for: "MG" No results Johnson for:  "VD25OH"  No results Johnson for: "PREALBUMIN"    Latest Ref Rng & Units 11/26/2021    1:08 PM 11/26/2021   12:24 PM 11/26/2021   11:12 AM  CBC EXTENDED  WBC 4.0 - 10.5 K/uL   9.5   RBC 3.87 - 5.11 MIL/uL   4.57   Hemoglobin 12.0 - 15.0 g/dL 82.9   93.7   HCT 16.9 - 46.0 % 45.0   41.6   Platelets 150 - 400 K/uL   221   NEUT# 1.7 - 7.7 K/uL  6.2    Lymph# 0.7 - 4.0 K/uL  1.4       There is no height or weight on file to calculate BMI.  Orders:  Orders Placed This Encounter  Procedures   MR Lumbar Spine w/o contrast   No orders of the defined types were placed in this encounter.    Procedures: No procedures performed  Clinical Data: No additional findings.  ROS:  All other systems negative, except as noted in the HPI. Review of Systems  Objective: Vital Signs: There were no vitals taken for this visit.  Specialty Comments:  No specialty comments available.  PMFS History: Patient Active Problem List   Diagnosis Date Noted   Acute CVA (cerebrovascular accident) (HCC) 11/06/2021   Dyslipidemia 11/06/2021  GERD without esophagitis 11/06/2021   CKD (chronic kidney disease), stage III (HCC) 11/23/2016   Pericardial effusion 11/23/2016   Complete heart block (HCC) 11/16/2016   Non-ST elevation (NSTEMI) myocardial infarction (HCC) 11/16/2016   Renal insufficiency 11/03/2015   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 09/06/2015   Uncontrolled hypertension 08/31/2015   HTN (hypertension), malignant    Symptomatic bradycardia    Hyperlipidemia    Epigastric fullness    Near syncope 08/28/2015   CAD S/P PCI    Essential hypertension 03/05/2013   Obesity (BMI 30-39.9) 03/05/2013   Hyperlipidemia with target LDL less than 70 03/05/2013   Obstructive sleep apnea 03/05/2013   Past Medical History:  Diagnosis Date   Arthritis    CKD (chronic kidney disease), stage III (HCC)    Complete heart block (HCC) 11/16/2016   a. transient during  NSTEMI, resolved with revascularization.   Coronary artery disease 11/2009   a. with prior stent placement to the ramus intermedius and RCA. b. NSTEMI 11/2016 s/p DES to DES to distal Cx with moderate residual dz.   CVA (cerebral vascular accident) [name])    Essential hypertension    Mixed hyperlipidemia    Non-ST elevation (NSTEMI) myocardial infarction (HCC) 11/16/2016   Obesity    Osteoarthritis    Reflux esophagitis    Sleep apnea 09/27/2010   Untreated, REM 64.7/hr AHI 18.5/hr RDI 19.2/hr. Patuent refused CPAP therapy    Family History  Problem Relation Age of Onset   Hypertension Mother    Aneurysm Mother        died at age 36   Other Father        unsure of medical history   Hypertension Other     Past Surgical History:  Procedure Laterality Date   CHOLECYSTECTOMY     CORONARY ANGIOPLASTY WITH STENT PLACEMENT  2007   A 3.0x66mm CYPHER stent post dilated to 3.29 mm the 100% occlusion was reduced to 0%   CORONARY STENT INTERVENTION N/A 11/16/2016   Procedure: Coronary Stent Intervention;  Surgeon: [name] Bollman, MD;  Location: [facility]VASIVE CV LAB;  Service: Cardiovascular;  Laterality: N/A;   LEFT HEART CATH AND CORONARY ANGIOGRAPHY N/A 11/16/2016   Procedure: Left Heart Cath and Coronary Angiography;  Surgeon: [name] Bollman, MD;  Location: [facility] INVASIVE CV LAB;  Service: Cardiovascular;  Laterality: N/A;   LEFT HEART CATHETERIZATION WITH CORONARY ANGIOGRAM N/A 07/12/2014   Procedure: LEFT HEART CATHETERIZATION WITH CORONARY ANGIOGRAM;  Surgeon: [name] Chapman, MD;  Location: [facility] CATH LAB;  Service: Cardiovascular;  Laterality: N/A;   TEMPORARY PACEMAKER N/A 11/16/2016   Procedure: Temporary Pacemaker;  Surgeon: [name] Bollman, MD;  Location: [facility] INVASIVE CV LAB;  Service: Cardiovascular;  Laterality: N/A;   Social History   Occupational History   Occupation: Retired  Tobacco Use   Smoking status: Never   Smokeless tobacco: Never  Vaping Use   Vaping Use: Never used  Substance  and Sexual Activity   Alcohol use: No   Drug use: No   Sexual activity: Never

## 2022-04-11 DIAGNOSIS — M5441 Lumbago with sciatica, right side: Secondary | ICD-10-CM | POA: Diagnosis not present

## 2022-04-11 DIAGNOSIS — M25651 Stiffness of right hip, not elsewhere classified: Secondary | ICD-10-CM | POA: Diagnosis not present

## 2022-04-11 DIAGNOSIS — M5431 Sciatica, right side: Secondary | ICD-10-CM | POA: Diagnosis not present

## 2022-04-11 DIAGNOSIS — M25551 Pain in right hip: Secondary | ICD-10-CM | POA: Diagnosis not present

## 2022-04-11 DIAGNOSIS — R262 Difficulty in walking, not elsewhere classified: Secondary | ICD-10-CM | POA: Diagnosis not present

## 2022-04-13 DIAGNOSIS — M5431 Sciatica, right side: Secondary | ICD-10-CM | POA: Diagnosis not present

## 2022-04-13 DIAGNOSIS — R262 Difficulty in walking, not elsewhere classified: Secondary | ICD-10-CM | POA: Diagnosis not present

## 2022-04-13 DIAGNOSIS — M5441 Lumbago with sciatica, right side: Secondary | ICD-10-CM | POA: Diagnosis not present

## 2022-04-13 DIAGNOSIS — M25551 Pain in right hip: Secondary | ICD-10-CM | POA: Diagnosis not present

## 2022-04-13 DIAGNOSIS — M25651 Stiffness of right hip, not elsewhere classified: Secondary | ICD-10-CM | POA: Diagnosis not present

## 2022-04-18 DIAGNOSIS — M25651 Stiffness of right hip, not elsewhere classified: Secondary | ICD-10-CM | POA: Diagnosis not present

## 2022-04-18 DIAGNOSIS — R262 Difficulty in walking, not elsewhere classified: Secondary | ICD-10-CM | POA: Diagnosis not present

## 2022-04-18 DIAGNOSIS — M5441 Lumbago with sciatica, right side: Secondary | ICD-10-CM | POA: Diagnosis not present

## 2022-04-18 DIAGNOSIS — M25551 Pain in right hip: Secondary | ICD-10-CM | POA: Diagnosis not present

## 2022-04-18 DIAGNOSIS — M5431 Sciatica, right side: Secondary | ICD-10-CM | POA: Diagnosis not present

## 2022-04-20 ENCOUNTER — Ambulatory Visit
Admission: RE | Admit: 2022-04-20 | Discharge: 2022-04-20 | Disposition: A | Discharge status: Home / Self Care | Payer: Medicare Other | Source: Ambulatory Visit | Attending: Orthopedic Surgery | Admitting: Orthopedic Surgery

## 2022-04-20 ENCOUNTER — Ambulatory Visit
Admission: RE | Admit: 2022-04-20 | Discharge: 2022-04-20 | Disposition: A | Discharge status: Home / Self Care | Payer: Medicare Other | Source: Ambulatory Visit

## 2022-04-20 ENCOUNTER — Other Ambulatory Visit: Payer: Self-pay | Admitting: Orthopedic Surgery

## 2022-04-20 DIAGNOSIS — G8929 Other chronic pain: Secondary | ICD-10-CM

## 2022-04-20 DIAGNOSIS — M5441 Lumbago with sciatica, right side: Secondary | ICD-10-CM

## 2022-04-20 DIAGNOSIS — M545 Low back pain, unspecified: Secondary | ICD-10-CM | POA: Diagnosis not present

## 2022-04-20 DIAGNOSIS — M79604 Pain in right leg: Secondary | ICD-10-CM | POA: Diagnosis not present

## 2022-04-20 DIAGNOSIS — M48061 Spinal stenosis, lumbar region without neurogenic claudication: Secondary | ICD-10-CM | POA: Diagnosis not present

## 2022-04-23 ENCOUNTER — Encounter: Payer: Self-pay | Admitting: Orthopedic Surgery

## 2022-04-23 ENCOUNTER — Other Ambulatory Visit: Payer: Self-pay | Admitting: Orthopedic Surgery

## 2022-04-23 ENCOUNTER — Telehealth: Payer: Self-pay | Admitting: Orthopedic Surgery

## 2022-04-23 DIAGNOSIS — G8929 Other chronic pain: Secondary | ICD-10-CM

## 2022-04-23 NOTE — Telephone Encounter (Signed)
Pt daughter called and would like someone to call her about her mothers mri results.

## 2022-04-24 NOTE — Telephone Encounter (Signed)
Can you please call this pt to set up an appt for MRI review with FN or Megan?

## 2022-04-24 NOTE — Telephone Encounter (Signed)
Mychart message sent to pt to advise of plan. Message sent to scheduling to see if they can have the results released and then the front desk will make an appt with FN for review. Will continue to hold and monitor for results.

## 2022-04-24 NOTE — Telephone Encounter (Signed)
Reaching out for radiologists report as it is not yet available. Pt had exam on Friday will hold pending radiologists report and will call family to advise.

## 2022-04-25 ENCOUNTER — Ambulatory Visit (INDEPENDENT_AMBULATORY_CARE_PROVIDER_SITE_OTHER): Payer: Medicare Other | Admitting: Physical Medicine and Rehabilitation

## 2022-04-25 ENCOUNTER — Encounter: Payer: Self-pay | Admitting: Physical Medicine and Rehabilitation

## 2022-04-25 VITALS — BP 159/75 | HR 86

## 2022-04-25 DIAGNOSIS — M48062 Spinal stenosis, lumbar region with neurogenic claudication: Secondary | ICD-10-CM

## 2022-04-25 DIAGNOSIS — R269 Unspecified abnormalities of gait and mobility: Secondary | ICD-10-CM | POA: Diagnosis not present

## 2022-04-25 DIAGNOSIS — R262 Difficulty in walking, not elsewhere classified: Secondary | ICD-10-CM | POA: Diagnosis not present

## 2022-04-25 DIAGNOSIS — M5416 Radiculopathy, lumbar region: Secondary | ICD-10-CM

## 2022-04-25 DIAGNOSIS — M25551 Pain in right hip: Secondary | ICD-10-CM | POA: Diagnosis not present

## 2022-04-25 DIAGNOSIS — M47816 Spondylosis without myelopathy or radiculopathy, lumbar region: Secondary | ICD-10-CM

## 2022-04-25 DIAGNOSIS — M5441 Lumbago with sciatica, right side: Secondary | ICD-10-CM | POA: Diagnosis not present

## 2022-04-25 DIAGNOSIS — M5431 Sciatica, right side: Secondary | ICD-10-CM | POA: Diagnosis not present

## 2022-04-25 DIAGNOSIS — I639 Cerebral infarction, unspecified: Secondary | ICD-10-CM

## 2022-04-25 DIAGNOSIS — M25651 Stiffness of right hip, not elsewhere classified: Secondary | ICD-10-CM | POA: Diagnosis not present

## 2022-04-25 NOTE — Progress Notes (Signed)
Pt state lower back pain that travels down her right leg. Pt state walking and standing makes the pain worse. Pt she has hx of of falling and injury her right knee. Pt state she takes pain meds to help ease her pain.  Numeric Pain Rating Scale and Functional Assessment Average Pain 10 Pain Right Now 7 My pain is constant, sharp, burning, tingling, and aching Pain is worse with: walking, sitting, standing, and some activites Pain improves with: heat/ice, therapy/exercise, and medication   In the last MONTH (on 0-10 scale) has pain interfered with the following?  1. General activity like being  able to carry out your everyday physical activities such as walking, climbing stairs, carrying groceries, or moving a chair?  Rating(5)  2. Relation with others like being able to carry out your usual social activities and roles such as  activities at home, at work and in your community. Rating(6)  3. Enjoyment of life such that you have  been bothered by emotional problems such as feeling anxious, depressed or irritable?  Rating(7)

## 2022-04-25 NOTE — Progress Notes (Unsigned)
[name] - 88 y.o. female MRN [id number]  Date of birth: [birth year]  Office Visit Note: Visit Date: 04/25/2022 PCP: Sharilyn Sites, MD Referred by: Sharilyn Sites, MD  Subjective: Chief Complaint  Patient presents with   Lower Back - Pain   Right Leg - Pain   Right Knee - Pain   Right Foot - Pain   HPI: [name] is a 88 y.o. female who comes in today per the request of Dr. Meridee Score for evaluation of chronic, worsening and severe bilateral lower back pain radiating to legs, right greater than left.  Pain ongoing for several months, worsened over the last few weeks.  States her pain is exacerbated by prolonged standing and walking, she describes her pain as a sore aching and tingling sensation, currently rates as 9 out of 10.  Patient reports some relief of pain with home exercise regimen, rest and use of medications.  Patient states she does take Percocet as needed for severe pain that is currently prescribed by her primary care provider Dr. Sharilyn Sites.  Patient is also regularly attending physical therapy at [facility] that does help to ease pain.  No history of lumbar injections/lumbar surgery.  Patient's recent lumbar MRI imaging exhibits multilevel facet arthropathy and severe spinal canal stenosis at the levels of L3-L4 and L4-L5.  Patient states her pain is negatively impacting her daily life and causing difficulty when completing tasks.  Patient is currently using a walker to assist with ambulation and prevent falls.  Patient does voice recent fall in June where she sustained fourth and fifth toe fractures, she is currently being followed by Dr. Sharol Given for this issue.  Patient denies focal weakness.  Patient's course is complicated by hypertension, myocardial infarction, CVA and chronic kidney disease.   Review of Systems  Musculoskeletal:  Positive for back pain.  Neurological:  Positive for tingling and sensory change. Negative for focal weakness and  weakness.  All other systems reviewed and are negative.  Otherwise per HPI.  Assessment & Plan: Visit Diagnoses:    ICD-10-CM   1. Lumbar radiculopathy  M54.16 Ambulatory referral to Physical Medicine Rehab    2. Spinal stenosis of lumbar region with neurogenic claudication  M48.062 Ambulatory referral to Physical Medicine Rehab    3. Facet arthropathy, lumbar  M47.816 Ambulatory referral to Physical Medicine Rehab    4. Gait abnormality  R26.9 Ambulatory referral to Physical Medicine Rehab       Plan: Findings:  Chronic, worsening and severe bilateral lower back pain radiating down to legs, right greater than left.  Patient's severe pain continues despite good conservative therapy such as formal physical therapy, home exercise regimen, rest and use of medications.  Patient's clinical presentation and exam are consistent with neurogenic claudication as a result of spinal canal stenosis.  I did discuss recent lumbar MRI using imaging and spine model with patient today.  There is severe spinal canal stenosis at the levels of L3-L4 and L4-L5 on recent lumbar MRI imaging.  Next step is to perform a diagnostic and hopefully therapeutic right L5-S1 interlaminar epidural steroid injection under fluoroscopic guidance.  Patient is not currently taking anticoagulant medications.  I did discuss lumbar epidural steroid injection procedure with patient today in detail, she has no further questions at this time.  If patient gets significant and sustained relief of pain with lumbar injection I did discuss the possibility of repeating this procedure infrequently as warranted.  We also briefly discussed  surgical options, I do not feel patient would be a good surgical candidate due to her advanced age and other comorbidities.  I feel that we can get patient back in quickly for injection.  No red flag symptoms noted upon exam today.    Meds & Orders: No orders of the defined types were placed in this encounter.    Orders Placed This Encounter  Procedures   Ambulatory referral to Physical Medicine Rehab    Follow-up: Return for Right L5-S1 interlaminar epidural steroid injection.   Procedures: No procedures performed      Clinical History: EXAM: MRI LUMBAR SPINE WITHOUT CONTRAST   TECHNIQUE: Multiplanar, multisequence MR imaging of the lumbar spine was performed. No intravenous contrast was administered.   COMPARISON:  Lumbar spine radiographs 02/13/2022, CT abdomen/pelvis 09/26/2018   FINDINGS: Segmentation: Standard; the lowest formed disc space is designated L5-S1.   Alignment: There is grade 1 anterolisthesis of L3 on L4 and L4 on L5 and trace retrolisthesis of L5 on S1.   Vertebrae: Vertebral body heights are preserved. Background marrow signal is normal. There is a prominent intraosseous hemangioma in the T11 vertebral body, unchanged since the prior CT from 2020. There are scattered small Schmorl's nodes without associated edema. There is no suspicious marrow signal abnormality or marrow edema.   Conus medullaris and cauda equina: Conus extends to the L1 level. Conus and cauda equina appear normal.   Paraspinal and other soft tissues: T2 hyperintense lesions in the kidneys most likely reflect benign cysts for which no specific imaging follow-up is required. The paraspinal soft tissues are unremarkable.   Disc levels:   There is advanced disc desiccation and narrowing at L5-S1. There is mild disc degeneration at the other levels.   T12-L1: There is a mild disc bulge and mild bilateral facet arthropathy resulting in mild left worse than right neural foraminal stenosis without significant spinal canal stenosis   L1-L2: There is a mild disc bulge and mild facet arthropathy without significant spinal canal or neural foraminal stenosis   L2-L3: There is a mild disc bulge eccentric to the left and mild bilateral facet arthropathy without significant spinal canal  or neural foraminal stenosis   L3-L4: There is grade 1 anterolisthesis with a diffuse disc bulge and advanced bilateral facet arthropathy with ligamentum flavum thickening resulting in severe spinal canal stenosis with cauda equina nerve root compression and moderate right and mild left neural foraminal stenosis   L4-L5: There is grade 1 anterolisthesis with a disc bulge eccentric to the right and advanced bilateral facet arthropathy with ligamentum flavum thickening resulting in severe spinal canal stenosis with cauda equina nerve root compression and severe right worse than left neural foraminal stenosis   L5-S1: There is advanced disc desiccation and narrowing with diffuse disc bulge and moderate bilateral facet arthropathy with trace effusions resulting in effacement of the bilateral subarticular zones (left worse than right) with potential impingement of either traversing S1 nerve root and moderate bilateral neural foraminal stenosis.   IMPRESSION: 1. Grade 1 anterolisthesis of L3 on L4 and L4 on L5 with associated disc bulges and advanced facet arthropathy resulting in severe spinal canal stenosis at both levels, and moderate right and mild left neural foraminal stenosis at L3-L4 and severe right worse than left neural foraminal stenosis at L4-L5. 2. Advanced disc degeneration with a bulge and bilateral facet arthropathy at L5-S1 resulting in effacement of the subarticular zones, left worse than right, with potential impingement of either traversing S1 nerve root  and moderate bilateral neural foraminal stenosis.     Electronically Signed   By: Lesia Hausen M.D.   On: 04/24/2022 08:48   She reports that she has never smoked. She has never used smokeless tobacco.  Recent Labs    11/06/21 0359  HGBA1C 5.4    Objective:  VS:  HT:    WT:   BMI:     BP:(!) 159/75  HR:86bpm  TEMP: ( )  RESP:  Physical Exam Vitals and nursing note reviewed.  HENT:     Head:  Normocephalic and atraumatic.     Right Ear: External ear normal.     Left Ear: External ear normal.     Nose: Nose normal.     Mouth/Throat:     Mouth: Mucous membranes are moist.  Eyes:     Extraocular Movements: Extraocular movements intact.  Cardiovascular:     Rate and Rhythm: Normal rate.     Pulses: Normal pulses.  Pulmonary:     Effort: Pulmonary effort is normal.  Abdominal:     General: Abdomen is flat. There is no distension.  Musculoskeletal:        General: Tenderness present.     Cervical back: Normal range of motion.     Comments: Pt is slow to rise from seated position to standing. Good lumbar range of motion. Strong distal strength without clonus, no pain upon palpation of greater trochanters. Sensation intact bilaterally. Ambulates with walker, gait slow and unsteady.  Skin:    General: Skin is warm and dry.     Capillary Refill: Capillary refill takes less than 2 seconds.  Neurological:     Mental Status: She is alert and oriented to person, place, and time.     Gait: Gait abnormal.  Psychiatric:        Mood and Affect: Mood normal.        Behavior: Behavior normal.     Ortho Exam  Imaging: No results found.  Past Medical/Family/Surgical/Social History: Medications & Allergies reviewed per EMR, new medications updated. Patient Active Problem List   Diagnosis Date Noted   Acute CVA (cerebrovascular accident) (HCC) 11/06/2021   Dyslipidemia 11/06/2021   GERD without esophagitis 11/06/2021   CKD (chronic kidney disease), stage III (HCC) 11/23/2016   Pericardial effusion 11/23/2016   Complete heart block (HCC) 11/16/2016   Non-ST elevation (NSTEMI) myocardial infarction (HCC) 11/16/2016   Renal insufficiency 11/03/2015   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 09/06/2015   Uncontrolled hypertension 08/31/2015   HTN (hypertension), malignant    Symptomatic bradycardia    Hyperlipidemia    Epigastric fullness    Near  syncope 08/28/2015   CAD S/P PCI    Essential hypertension 03/05/2013   Obesity (BMI 30-39.9) 03/05/2013   Hyperlipidemia with target LDL less than 70 03/05/2013   Obstructive sleep apnea 03/05/2013   Past Medical History:  Diagnosis Date   Arthritis    CKD (chronic kidney disease), stage III (HCC)    Complete heart block (HCC) 11/16/2016   a. transient during NSTEMI, resolved with revascularization.   Coronary artery disease 11/2009   a. with prior stent placement to the ramus intermedius and RCA. b. NSTEMI 11/2016 s/p DES to DES to distal Cx with moderate residual dz.   CVA (cerebral vascular accident) [facility])    Essential hypertension    Mixed hyperlipidemia    Non-ST elevation (NSTEMI) myocardial infarction (HCC) 11/16/2016   Obesity    Osteoarthritis  Reflux esophagitis    Sleep apnea 09/27/2010   Untreated, REM 64.7/hr AHI 18.5/hr RDI 19.2/hr. Patuent refused CPAP therapy   Family History  Problem Relation Age of Onset   Hypertension Mother    Aneurysm Mother        died at age 44   Other Father        unsure of medical history   Hypertension Other    Past Surgical History:  Procedure Laterality Date   CHOLECYSTECTOMY     CORONARY ANGIOPLASTY WITH STENT PLACEMENT  2007   A 3.0x83mm CYPHER stent post dilated to 3.29 mm the 100% occlusion was reduced to 0%   CORONARY STENT INTERVENTION N/A 11/16/2016   Procedure: Coronary Stent Intervention;  Surgeon: Tonny Bollman, MD;  Location: [facility] INVASIVE CV LAB;  Service: Cardiovascular;  Laterality: N/A;   LEFT HEART CATH AND CORONARY ANGIOGRAPHY N/A 11/16/2016   Procedure: Left Heart Cath and Coronary Angiography;  Surgeon: Tonny Bollman, MD;  Location: [facility] INVASIVE CV LAB;  Service: Cardiovascular;  Laterality: N/A;   LEFT HEART CATHETERIZATION WITH CORONARY ANGIOGRAM N/A 07/12/2014   Procedure: LEFT HEART CATHETERIZATION WITH CORONARY ANGIOGRAM;  Surgeon: Micheline Chapman, MD;  Location: [facility] CATH LAB;  Service: Cardiovascular;   Laterality: N/A;   TEMPORARY PACEMAKER N/A 11/16/2016   Procedure: Temporary Pacemaker;  Surgeon: Tonny Bollman, MD;  Location: [facility] INVASIVE CV LAB;  Service: Cardiovascular;  Laterality: N/A;   Social History   Occupational History   Occupation: Retired  Tobacco Use   Smoking status: Never   Smokeless tobacco: Never  Vaping Use   Vaping Use: Never used  Substance and Sexual Activity   Alcohol use: No   Drug use: No   Sexual activity: Never

## 2022-04-26 ENCOUNTER — Telehealth: Payer: Self-pay | Admitting: *Deleted

## 2022-04-26 ENCOUNTER — Encounter: Payer: Self-pay | Admitting: *Deleted

## 2022-04-26 NOTE — Patient Outreach (Signed)
  Care Coordination   Initial Visit Note   04/26/2022 Name: JONASIA COINER MRN: [id number] DOB: [birth year]  Mycala M Dau is a 88 y.o. year old female who sees Assunta Found, MD for primary care. I spoke with  Cerina Estill Bamberg by phone today  What matters to the patients health and wellness today?  Schedule AWV. Requests call back to discuss care coordination and complete initial visit. Did not feel well today.     Goals Addressed             This Visit's Progress    Schedule AWV       Care Coordination Interventions: Collaborated with [facility] regarding need for AWV scheduling Provided verbal information on AWVs Provided brief overview of Care Coordination Program. Patient would like a call back at a later date to discuss due to not feeling well today.             SDOH assessments and interventions completed:  No    Care Coordination Interventions Activated:  Yes   Care Coordination Interventions:  Yes, provided   Follow up plan: Follow up call scheduled for 05/03/22 with [facility] to complete initial intake    Encounter Outcome:  Pt. Visit Completed   Demetrios Loll, BSN, RN-BC RN Care Coordinator Direct Dial: [phone]

## 2022-04-27 DIAGNOSIS — R262 Difficulty in walking, not elsewhere classified: Secondary | ICD-10-CM | POA: Diagnosis not present

## 2022-04-27 DIAGNOSIS — M5441 Lumbago with sciatica, right side: Secondary | ICD-10-CM | POA: Diagnosis not present

## 2022-04-27 DIAGNOSIS — M25551 Pain in right hip: Secondary | ICD-10-CM | POA: Diagnosis not present

## 2022-04-27 DIAGNOSIS — M5431 Sciatica, right side: Secondary | ICD-10-CM | POA: Diagnosis not present

## 2022-04-27 DIAGNOSIS — M25651 Stiffness of right hip, not elsewhere classified: Secondary | ICD-10-CM | POA: Diagnosis not present

## 2022-04-28 ENCOUNTER — Other Ambulatory Visit: Payer: Self-pay | Admitting: Cardiology

## 2022-05-01 DIAGNOSIS — D638 Anemia in other chronic diseases classified elsewhere: Secondary | ICD-10-CM | POA: Diagnosis not present

## 2022-05-01 DIAGNOSIS — R93421 Abnormal radiologic findings on diagnostic imaging of right kidney: Secondary | ICD-10-CM | POA: Diagnosis not present

## 2022-05-01 DIAGNOSIS — M25551 Pain in right hip: Secondary | ICD-10-CM | POA: Diagnosis not present

## 2022-05-01 DIAGNOSIS — R809 Proteinuria, unspecified: Secondary | ICD-10-CM | POA: Diagnosis not present

## 2022-05-01 DIAGNOSIS — I129 Hypertensive chronic kidney disease with stage 1 through stage 4 chronic kidney disease, or unspecified chronic kidney disease: Secondary | ICD-10-CM | POA: Diagnosis not present

## 2022-05-01 DIAGNOSIS — N1832 Chronic kidney disease, stage 3b: Secondary | ICD-10-CM | POA: Diagnosis not present

## 2022-05-01 DIAGNOSIS — M25651 Stiffness of right hip, not elsewhere classified: Secondary | ICD-10-CM | POA: Diagnosis not present

## 2022-05-01 DIAGNOSIS — R262 Difficulty in walking, not elsewhere classified: Secondary | ICD-10-CM | POA: Diagnosis not present

## 2022-05-01 DIAGNOSIS — N1831 Chronic kidney disease, stage 3a: Secondary | ICD-10-CM | POA: Diagnosis not present

## 2022-05-01 DIAGNOSIS — D508 Other iron deficiency anemias: Secondary | ICD-10-CM | POA: Diagnosis not present

## 2022-05-01 DIAGNOSIS — M5431 Sciatica, right side: Secondary | ICD-10-CM | POA: Diagnosis not present

## 2022-05-01 DIAGNOSIS — M5441 Lumbago with sciatica, right side: Secondary | ICD-10-CM | POA: Diagnosis not present

## 2022-05-01 DIAGNOSIS — N2581 Secondary hyperparathyroidism of renal origin: Secondary | ICD-10-CM | POA: Diagnosis not present

## 2022-05-02 ENCOUNTER — Encounter: Payer: Self-pay | Admitting: Physical Medicine and Rehabilitation

## 2022-05-02 ENCOUNTER — Ambulatory Visit (INDEPENDENT_AMBULATORY_CARE_PROVIDER_SITE_OTHER): Payer: Medicare Other | Admitting: Physical Medicine and Rehabilitation

## 2022-05-02 ENCOUNTER — Ambulatory Visit: Payer: Self-pay

## 2022-05-02 ENCOUNTER — Telehealth: Payer: Self-pay | Admitting: Physical Medicine and Rehabilitation

## 2022-05-02 VITALS — BP 147/80 | HR 86

## 2022-05-02 DIAGNOSIS — M5416 Radiculopathy, lumbar region: Secondary | ICD-10-CM

## 2022-05-02 MED ORDER — METHYLPREDNISOLONE ACETATE 80 MG/ML IJ SUSP
40.0000 mg | Freq: Once | INTRAMUSCULAR | Status: AC
  Administered 2022-05-02: 40 mg

## 2022-05-02 NOTE — Progress Notes (Signed)
Pt state lower back pain that travels down her right leg. Pt state walking and standing makes the pain worse. Pt state she takes pain meds to help ease her pain.  Numeric Pain Rating Scale and Functional Assessment Average Pain 9   In the last MONTH (on 0-10 scale) has pain interfered with the following?  1. General activity like being  able to carry out your everyday physical activities such as walking, climbing stairs, carrying groceries, or moving a chair?  Rating(10)   +Driver, -BT, -Dye Allergies.

## 2022-05-02 NOTE — Patient Instructions (Signed)

## 2022-05-03 ENCOUNTER — Ambulatory Visit: Payer: Self-pay | Admitting: *Deleted

## 2022-05-03 ENCOUNTER — Encounter: Payer: Self-pay | Admitting: *Deleted

## 2022-05-03 DIAGNOSIS — M25551 Pain in right hip: Secondary | ICD-10-CM | POA: Diagnosis not present

## 2022-05-03 DIAGNOSIS — M25651 Stiffness of right hip, not elsewhere classified: Secondary | ICD-10-CM | POA: Diagnosis not present

## 2022-05-03 DIAGNOSIS — M5431 Sciatica, right side: Secondary | ICD-10-CM | POA: Diagnosis not present

## 2022-05-03 DIAGNOSIS — R262 Difficulty in walking, not elsewhere classified: Secondary | ICD-10-CM | POA: Diagnosis not present

## 2022-05-03 DIAGNOSIS — M5441 Lumbago with sciatica, right side: Secondary | ICD-10-CM | POA: Diagnosis not present

## 2022-05-03 NOTE — Patient Outreach (Signed)
  Care Coordination   Initial Visit Note   05/03/2022 Name: [name] MRN: [id number] DOB: 1933-10-19  [name] is a 88 y.o. year old female who sees [name] Found, MD for primary care. I spoke with  [name] by phone today  What matters to the patients health and wellness today?  Patient continues to work on sciatic pain and managing HTN.  Active with out patient PT, daughters supportive in care.  Report she is no longer seeing [name] and is now active with [name] at [facility].  Confirmed member is not eligible for services, no follow up needed.  She denies any urgent needs or concerns.    SDOH assessments and interventions completed:  Yes  SDOH Interventions Today    Flowsheet Row Most Recent Value  SDOH Interventions   Food Insecurity Interventions Intervention Not Indicated  Housing Interventions Intervention Not Indicated  Transportation Interventions Intervention Not Indicated        Care Coordination Interventions Activated:  Yes  Care Coordination Interventions:  Yes, provided   Follow up plan: No further intervention required.   Encounter Outcome:  Pt. Visit Completed   [name] Durie, RN, MSN, [facility] Care Management Care Management Coordinator [phone]

## 2022-05-07 DIAGNOSIS — I10 Essential (primary) hypertension: Secondary | ICD-10-CM | POA: Diagnosis not present

## 2022-05-07 DIAGNOSIS — M25551 Pain in right hip: Secondary | ICD-10-CM | POA: Diagnosis not present

## 2022-05-07 DIAGNOSIS — M5431 Sciatica, right side: Secondary | ICD-10-CM | POA: Diagnosis not present

## 2022-05-07 DIAGNOSIS — M5441 Lumbago with sciatica, right side: Secondary | ICD-10-CM | POA: Diagnosis not present

## 2022-05-07 DIAGNOSIS — N2581 Secondary hyperparathyroidism of renal origin: Secondary | ICD-10-CM | POA: Diagnosis not present

## 2022-05-07 DIAGNOSIS — M25651 Stiffness of right hip, not elsewhere classified: Secondary | ICD-10-CM | POA: Diagnosis not present

## 2022-05-07 DIAGNOSIS — R809 Proteinuria, unspecified: Secondary | ICD-10-CM | POA: Diagnosis not present

## 2022-05-07 DIAGNOSIS — R262 Difficulty in walking, not elsewhere classified: Secondary | ICD-10-CM | POA: Diagnosis not present

## 2022-05-07 DIAGNOSIS — N1832 Chronic kidney disease, stage 3b: Secondary | ICD-10-CM | POA: Diagnosis not present

## 2022-05-10 DIAGNOSIS — Z0000 Encounter for general adult medical examination without abnormal findings: Secondary | ICD-10-CM | POA: Diagnosis not present

## 2022-05-10 NOTE — Progress Notes (Signed)
[name] - 88 y.o. female MRN [id number]  Date of birth: Jun 11, 1934  Office Visit Note: Visit Date: 05/02/2022 PCP: Assunta Found, MD Referred by: Assunta Found, MD  Subjective: Chief Complaint  Patient presents with   Lower Back - Pain   Right Leg - Pain   HPI:  [name] is a 88 y.o. female who comes in today at the request of [name] Goodie, FNP for planned Right L5-S1 Lumbar Interlaminar epidural steroid injection with fluoroscopic guidance.  The patient has failed conservative care including home exercise, medications, time and activity modification.  This injection will be diagnostic and hopefully therapeutic.  Please see requesting physician notes for further details and justification.   ROS Otherwise per HPI.  Assessment & Plan: Visit Diagnoses:    ICD-10-CM   1. Lumbar radiculopathy  M54.16 XR C-ARM NO REPORT    Epidural Steroid injection    methylPREDNISolone acetate (DEPO-MEDROL) injection 40 mg      Plan: No additional findings.   Meds & Orders:  Meds ordered this encounter  Medications   methylPREDNISolone acetate (DEPO-MEDROL) injection 40 mg    Orders Placed This Encounter  Procedures   XR C-ARM NO REPORT   Epidural Steroid injection    Follow-up: Return for visit to requesting provider as needed.   Procedures: No procedures performed  Lumbar Epidural Steroid Injection - Interlaminar Approach with Fluoroscopic Guidance  Patient: [name]      Date of Birth: 07-Feb-1934 MRN: [id number] PCP: Assunta Found, MD      Visit Date: 05/02/2022   Universal Protocol:     Consent Given By: the patient  Position: PRONE  Additional Comments: Vital signs were monitored before and after the procedure. Patient was prepped and draped in the usual sterile fashion. The correct patient, procedure, and site was verified.   Injection Procedure Details:   Procedure diagnoses: Lumbar radiculopathy [M54.16]   Meds Administered:  Meds ordered  this encounter  Medications   methylPREDNISolone acetate (DEPO-MEDROL) injection 40 mg     Laterality: Right  Location/Site:  L5-S1  Needle: 3.5 in., 20 ga. Tuohy  Needle Placement: Paramedian epidural  Findings:   -Comments: Excellent flow of contrast into the epidural space.  Procedure Details: Using a paramedian approach from the side mentioned above, the region overlying the inferior lamina was localized under fluoroscopic visualization and the soft tissues overlying this structure were infiltrated with 4 ml. of 1% Lidocaine without Epinephrine. The Tuohy needle was inserted into the epidural space using a paramedian approach.   The epidural space was localized using loss of resistance along with counter oblique bi-planar fluoroscopic views.  After negative aspirate for air, blood, and CSF, a 2 ml. volume of Isovue-250 was injected into the epidural space and the flow of contrast was observed. Radiographs were obtained for documentation purposes.    The injectate was administered into the level noted above.   Additional Comments:  The patient tolerated the procedure well Dressing: 2 x 2 sterile gauze and Band-Aid    Post-procedure details: Patient was observed during the procedure. Post-procedure instructions were reviewed.  Patient left the clinic in stable condition.   Clinical History: EXAM: MRI LUMBAR SPINE WITHOUT CONTRAST   TECHNIQUE: Multiplanar, multisequence MR imaging of the lumbar spine was performed. No intravenous contrast was administered.   COMPARISON:  Lumbar spine radiographs 02/13/2022, CT abdomen/pelvis 09/26/2018   FINDINGS: Segmentation: Standard; the lowest formed disc space is designated L5-S1.   Alignment: There is grade 1  anterolisthesis of L3 on L4 and L4 on L5 and trace retrolisthesis of L5 on S1.   Vertebrae: Vertebral body heights are preserved. Background marrow signal is normal. There is a prominent intraosseous hemangioma  in the T11 vertebral body, unchanged since the prior CT from 2020. There are scattered small Schmorl's nodes without associated edema. There is no suspicious marrow signal abnormality or marrow edema.   Conus medullaris and cauda equina: Conus extends to the L1 level. Conus and cauda equina appear normal.   Paraspinal and other soft tissues: T2 hyperintense lesions in the kidneys most likely reflect benign cysts for which no specific imaging follow-up is required. The paraspinal soft tissues are unremarkable.   Disc levels:   There is advanced disc desiccation and narrowing at L5-S1. There is mild disc degeneration at the other levels.   T12-L1: There is a mild disc bulge and mild bilateral facet arthropathy resulting in mild left worse than right neural foraminal stenosis without significant spinal canal stenosis   L1-L2: There is a mild disc bulge and mild facet arthropathy without significant spinal canal or neural foraminal stenosis   L2-L3: There is a mild disc bulge eccentric to the left and mild bilateral facet arthropathy without significant spinal canal or neural foraminal stenosis   L3-L4: There is grade 1 anterolisthesis with a diffuse disc bulge and advanced bilateral facet arthropathy with ligamentum flavum thickening resulting in severe spinal canal stenosis with cauda equina nerve root compression and moderate right and mild left neural foraminal stenosis   L4-L5: There is grade 1 anterolisthesis with a disc bulge eccentric to the right and advanced bilateral facet arthropathy with ligamentum flavum thickening resulting in severe spinal canal stenosis with cauda equina nerve root compression and severe right worse than left neural foraminal stenosis   L5-S1: There is advanced disc desiccation and narrowing with diffuse disc bulge and moderate bilateral facet arthropathy with trace effusions resulting in effacement of the bilateral subarticular zones (left  worse than right) with potential impingement of either traversing S1 nerve root and moderate bilateral neural foraminal stenosis.   IMPRESSION: 1. Grade 1 anterolisthesis of L3 on L4 and L4 on L5 with associated disc bulges and advanced facet arthropathy resulting in severe spinal canal stenosis at both levels, and moderate right and mild left neural foraminal stenosis at L3-L4 and severe right worse than left neural foraminal stenosis at L4-L5. 2. Advanced disc degeneration with a bulge and bilateral facet arthropathy at L5-S1 resulting in effacement of the subarticular zones, left worse than right, with potential impingement of either traversing S1 nerve root and moderate bilateral neural foraminal stenosis.     Electronically Signed   By: Lesia Hausen M.D.   On: 04/24/2022 08:48     Objective:  VS:  HT:    WT:   BMI:     BP:(!) 147/80  HR:86bpm  TEMP: ( )  RESP:  Physical Exam Vitals and nursing note reviewed.  Constitutional:      General: She is not in acute distress.    Appearance: Normal appearance. She is not ill-appearing.  HENT:     Head: Normocephalic and atraumatic.     Right Ear: External ear normal.     Left Ear: External ear normal.  Eyes:     Extraocular Movements: Extraocular movements intact.  Cardiovascular:     Rate and Rhythm: Normal rate.     Pulses: Normal pulses.  Pulmonary:     Effort: Pulmonary effort is normal. No respiratory  distress.  Abdominal:     General: There is no distension.     Palpations: Abdomen is soft.  Musculoskeletal:        General: Tenderness present.     Cervical back: Neck supple.     Right lower leg: No edema.     Left lower leg: No edema.     Comments: Patient has good distal strength with no pain over the greater trochanters.  No clonus or focal weakness.  Skin:    Findings: No erythema, lesion or rash.  Neurological:     General: No focal deficit present.     Mental Status: She is alert and oriented to  person, place, and time.     Sensory: No sensory deficit.     Motor: No weakness or abnormal muscle tone.     Coordination: Coordination normal.  Psychiatric:        Mood and Affect: Mood normal.        Behavior: Behavior normal.      Imaging: No results found.

## 2022-05-10 NOTE — Procedures (Signed)
Lumbar Epidural Steroid Injection - Interlaminar Approach with Fluoroscopic Guidance  Patient: [name]      Date of Birth: 01-21-1934 MRN: [id number] PCP: Assunta Found, MD      Visit Date: 05/02/2022   Universal Protocol:     Consent Given By: the patient  Position: PRONE  Additional Comments: Vital signs were monitored before and after the procedure. Patient was prepped and draped in the usual sterile fashion. The correct patient, procedure, and site was verified.   Injection Procedure Details:   Procedure diagnoses: Lumbar radiculopathy [M54.16]   Meds Administered:  Meds ordered this encounter  Medications   methylPREDNISolone acetate (DEPO-MEDROL) injection 40 mg     Laterality: Right  Location/Site:  L5-S1  Needle: 3.5 in., 20 ga. Tuohy  Needle Placement: Paramedian epidural  Findings:   -Comments: Excellent flow of contrast into the epidural space.  Procedure Details: Using a paramedian approach from the side mentioned above, the region overlying the inferior lamina was localized under fluoroscopic visualization and the soft tissues overlying this structure were infiltrated with 4 ml. of 1% Lidocaine without Epinephrine. The Tuohy needle was inserted into the epidural space using a paramedian approach.   The epidural space was localized using loss of resistance along with counter oblique bi-planar fluoroscopic views.  After negative aspirate for air, blood, and CSF, a 2 ml. volume of Isovue-250 was injected into the epidural space and the flow of contrast was observed. Radiographs were obtained for documentation purposes.    The injectate was administered into the level noted above.   Additional Comments:  The patient tolerated the procedure well Dressing: 2 x 2 sterile gauze and Band-Aid    Post-procedure details: Patient was observed during the procedure. Post-procedure instructions were reviewed.  Patient left the clinic in stable condition.

## 2022-05-11 ENCOUNTER — Encounter: Payer: Self-pay | Admitting: Cardiology

## 2022-05-11 ENCOUNTER — Ambulatory Visit: Payer: Medicare Other | Attending: Cardiology | Admitting: Cardiology

## 2022-05-11 VITALS — BP 160/80 | HR 83 | Ht 65.0 in | Wt 196.2 lb

## 2022-05-11 DIAGNOSIS — E782 Mixed hyperlipidemia: Secondary | ICD-10-CM | POA: Diagnosis not present

## 2022-05-11 DIAGNOSIS — M25551 Pain in right hip: Secondary | ICD-10-CM | POA: Diagnosis not present

## 2022-05-11 DIAGNOSIS — R262 Difficulty in walking, not elsewhere classified: Secondary | ICD-10-CM | POA: Diagnosis not present

## 2022-05-11 DIAGNOSIS — I639 Cerebral infarction, unspecified: Secondary | ICD-10-CM | POA: Diagnosis not present

## 2022-05-11 DIAGNOSIS — M5431 Sciatica, right side: Secondary | ICD-10-CM | POA: Diagnosis not present

## 2022-05-11 DIAGNOSIS — N1832 Chronic kidney disease, stage 3b: Secondary | ICD-10-CM | POA: Insufficient documentation

## 2022-05-11 DIAGNOSIS — I10 Essential (primary) hypertension: Secondary | ICD-10-CM | POA: Insufficient documentation

## 2022-05-11 DIAGNOSIS — M25651 Stiffness of right hip, not elsewhere classified: Secondary | ICD-10-CM | POA: Diagnosis not present

## 2022-05-11 DIAGNOSIS — M5441 Lumbago with sciatica, right side: Secondary | ICD-10-CM | POA: Diagnosis not present

## 2022-05-11 DIAGNOSIS — I25119 Atherosclerotic heart disease of native coronary artery with unspecified angina pectoris: Secondary | ICD-10-CM | POA: Insufficient documentation

## 2022-05-11 NOTE — Patient Instructions (Signed)
.  Medication Instructions:  °Your physician recommends that you continue on your current medications as directed. Please refer to the Current Medication list given to you today. ° ° °Labwork: °None today ° °Testing/Procedures: °None  ° °Follow-Up: °6 months ° °Any Other Special Instructions Will Be Listed Below (If Applicable). ° °If you need a refill on your cardiac medications before your next appointment, please call your pharmacy. ° °

## 2022-05-11 NOTE — Progress Notes (Signed)
Cardiology Office Note  Date: 05/11/2022   ID: [name], DOB Jan 17, 1934, MRN [id number]  PCP:  Cory Munch, PA-C  Cardiologist:  Rozann Lesches, MD Electrophysiologist:  None   Chief Complaint  Patient presents with   Cardiac follow-up    History of Present Illness: [name] is an 88 y.o. female last seen in February.  She is here with her daughter for a routine visit.  Reports no angina symptoms with current level of activity.  She is undergoing physical therapy, also has limitations related to bilateral knee arthritis and sciatica.  She has CKD stage IIIb followed by nephrology, recent EGFR 44.  I reviewed the note from August 28.  She was started on carvedilol 6.25 mg twice daily as well for better control of blood pressure.  She has not picked up the medication as yet but does plan to start it.  I reviewed the remainder of her medications which are outlined below.  She had lipids obtained by PCP, we are requesting the results.  Continues on Crestor 40 mg daily.  Her last LDL was 67 in February.  Past Medical History:  Diagnosis Date   Arthritis    CKD (chronic kidney disease), stage III ([place])    Complete heart block ([place]) 11/16/2016   a. transient during NSTEMI, resolved with revascularization.   Coronary artery disease 11/2009   a. with prior stent placement to the ramus intermedius and RCA. b. NSTEMI 11/2016 s/p DES to DES to distal Cx with moderate residual dz.   CVA (cerebral vascular accident) [facility])    Essential hypertension    Mixed hyperlipidemia    Non-ST elevation (NSTEMI) myocardial infarction ([place]) 11/16/2016   Obesity    Osteoarthritis    Reflux esophagitis    Sleep apnea 09/27/2010   Untreated, REM 64.7/hr AHI 18.5/hr RDI 19.2/hr. Patuent refused CPAP therapy    Past Surgical History:  Procedure Laterality Date   CHOLECYSTECTOMY     CORONARY ANGIOPLASTY WITH STENT PLACEMENT  2007   A 3.0x21m CYPHER stent post dilated to 3.29 mm the 100%  occlusion was reduced to 0%   CORONARY STENT INTERVENTION N/A 11/16/2016   Procedure: Coronary Stent Intervention;  Surgeon: MSherren Mocha MD;  [facility];  Service: Cardiovascular;  Laterality: N/A;   LEFT HEART CATH AND CORONARY ANGIOGRAPHY N/A 11/16/2016   Procedure: Left Heart Cath and Coronary Angiography;  Surgeon: MSherren Mocha MD;  [facility];  Service: Cardiovascular;  Laterality: N/A;   LEFT HEART CATHETERIZATION WITH CORONARY ANGIOGRAM N/A 07/12/2014   Procedure: LEFT HEART CATHETERIZATION WITH CORONARY ANGIOGRAM;  Surgeon: MBlane Ohara MD;  [facility];  Service: Cardiovascular;  Laterality: N/A;   TEMPORARY PACEMAKER N/A 11/16/2016   Procedure: Temporary Pacemaker;  Surgeon: MSherren Mocha MD;  [facility];  Service: Cardiovascular;  Laterality: N/A;    Current Outpatient Medications  Medication Sig Dispense Refill   acetaminophen (TYLENOL) 500 MG tablet Take 500 mg by mouth every 6 (six) hours as needed for mild pain or moderate pain.     amLODipine (NORVASC) 5 MG tablet TAKE 1 TABLET BY MOUTH EVERY DAY 90 tablet 3   aspirin 81 MG tablet Take 81 mg by mouth daily.     cyclobenzaprine (FLEXERIL) 5 MG tablet Take 1 tablet (5 mg total) by mouth 3 (three) times daily as needed for muscle spasms. Do not drink alcohol or drive while taking this medication.  May cause  drowsiness. 15 tablet 0   fluticasone (FLONASE) 50 MCG/ACT nasal spray Place 2 sprays into both nostrils daily. 16 g 0   furosemide (LASIX) 20 MG tablet Take 20 mg by mouth daily as needed for fluid.     hydrALAZINE (APRESOLINE) 50 MG tablet TAKE 1 TABLET BY MOUTH TWICE A DAY 180 tablet 3   losartan (COZAAR) 50 MG tablet TAKE 1 TABLET BY MOUTH EVERY DAY 90 tablet 3   Menthol, Topical Analgesic, (BIOFREEZE ROLL-ON) 4 % GEL Apply 1 application. topically daily as needed (pain).     Menthol, Topical Analgesic, (BIOFREEZE) 4 % GEL Apply 1 patch topically daily as  needed (knee pain).     methocarbamol (ROBAXIN) 500 MG tablet Take 1 tablet (500 mg total) by mouth every 6 (six) hours as needed for muscle spasms. 30 tablet 0   nitroGLYCERIN (NITROSTAT) 0.4 MG SL tablet PLACE 1 TABLET UNDER THE TONGUE EVERY 5 MINUTES AS NEEDED. 25 tablet 3   nystatin cream (MYCOSTATIN) Apply to affected area 2 times daily 30 g 0   ondansetron (ZOFRAN) 4 MG tablet Take 4 mg by mouth every 8 (eight) hours as needed for vomiting or nausea.     oxyCODONE-acetaminophen (PERCOCET/ROXICET) 5-325 MG tablet Take 1 tablet by mouth every 8 (eight) hours as needed for severe pain.     pantoprazole (PROTONIX) 40 MG tablet TAKE 1 TABLET BY MOUTH EVERY DAY 90 tablet 1   predniSONE (DELTASONE) 10 MG tablet Take 2 tablets (20 mg total) by mouth daily with breakfast. 60 tablet 0   predniSONE (DELTASONE) 20 MG tablet Take 2 tablets (40 mg total) by mouth daily. Do not begin taking until 5/30 8 tablet 0   rosuvastatin (CRESTOR) 40 MG tablet TAKE 1 TABLET BY MOUTH EVERY DAY 90 tablet 3   Vitamin D, Ergocalciferol, (DRISDOL) 1.25 MG (50000 UNIT) CAPS capsule Take 50,000 Units by mouth every 30 (thirty) days.     carvedilol (COREG) 6.25 MG tablet Take 6.25 mg by mouth 2 (two) times daily with a meal.     gabapentin (NEURONTIN) 100 MG capsule SMARTSIG:1 Capsule(s) By Mouth 1-3 Times Daily (Patient not taking: Reported on 05/11/2022)     No current facility-administered medications for this visit.   Allergies:  Atorvastatin, Contrast media [iodinated contrast media], Darvon [propoxyphene], and Codeine   ROS: No syncope.  Physical Exam: VS:  BP (!) 160/80   Pulse 83   Ht '5\' 5"'  (1.651 m)   Wt 196 lb 3.2 oz (89 kg)   SpO2 98%   BMI 32.65 kg/m , BMI Body mass index is 32.65 kg/m.  Wt Readings from Last 3 Encounters:  05/11/22 196 lb 3.2 oz (89 kg)  02/19/22 182 lb 1.6 oz (82.6 kg)  02/05/22 182 lb (82.6 kg)    General: Patient appears comfortable at rest. HEENT: Conjunctiva and lids  normal. Neck: Supple, no elevated JVP or carotid bruits, no thyromegaly. Lungs: Clear to auscultation, nonlabored breathing at rest. Cardiac: Regular rate and rhythm, no S3 or significant systolic murmur, no pericardial rub. Extremities: No pitting edema.  ECG:  An ECG dated 11/26/2021 was personally reviewed today and demonstrated:  Sinus rhythm with right bundle branch block and left anterior fascicular block.  Recent Labwork: 11/05/2021: ALT 24; AST 31 11/26/2021: BUN 24; Creatinine, Ser 1.40; Hemoglobin 15.3; Platelets 221; Potassium 4.6; Sodium 140     Component Value Date/Time   CHOL 132 11/06/2021 0400   CHOL 146 01/19/2015 1048   TRIG 77 11/06/2021 0400  HDL 50 11/06/2021 0400   HDL 48 01/19/2015 1048   CHOLHDL 2.6 11/06/2021 0400   VLDL 15 11/06/2021 0400   LDLCALC 67 11/06/2021 0400   LDLCALC 77 01/19/2015 1048    Other Studies Reviewed Today:  Carotid Dopplers 11/06/2021: Summary:  Right Carotid: Velocities in the right ICA are consistent with a 1-39%  stenosis.   Left Carotid: Velocities in the left ICA are consistent with a 1-39%  stenosis.   Vertebrals: Bilateral vertebral arteries demonstrate antegrade flow.   Echocardiogram 11/06/2021:  1. Left ventricular ejection fraction, by estimation, is 65 to 70%. The  left ventricle has normal function. The left ventricle has no regional  wall motion abnormalities. There is moderate concentric left ventricular  hypertrophy. Left ventricular  diastolic parameters are consistent with Grade I diastolic dysfunction  (impaired relaxation).   2. Right ventricular systolic function is normal. The right ventricular  size is normal. There is normal pulmonary artery systolic pressure.   3. The mitral valve is normal in structure. Trivial mitral valve  regurgitation.   4. The aortic valve is tricuspid. There is mild calcification of the  aortic valve. There is mild thickening of the aortic valve. Aortic valve  regurgitation is  mild. Aortic valve sclerosis/calcification is present,  without any evidence of aortic  stenosis.   5. The inferior vena cava is normal in size with greater than 50%  respiratory variability, suggesting right atrial pressure of 3 mmHg.   6. Agitated saline contrast bubble study was negative, with no evidence  of any interatrial shunt.   Cardiac monitor April 2023: [employer] monitor reviewed.  30 days analyzed.  Predominant rhythm is sinus with heart rate ranging from 56 bpm up to 128 bpm and average heart rate 75 bpm.  Frequent PACs were noted representing approximately 8% total beats.  There were rare PVCs representing less than 1% total beats.  Episodes of NSVT were also noted ranging from 4 beats to 8 beats, none were sustained.  No atrial fibrillation was observed.  There were no pauses.  Assessment and Plan:  1.  CAD status post DES to the distal circumflex in March 2018 with patent stent sites in the ramus intermedius and RCA at that time.  She has continued to do well in terms of no progressive angina on medical therapy in the meanwhile.  Currently on aspirin, Norvasc, hydralazine, Cozaar, Crestor, and as needed nitroglycerin.  2.  Essential hypertension, on multimodal therapy.  Coreg has been added by nephrologist.  3.  Mixed hyperlipidemia, continues on high-dose Crestor.  Requesting interval lipid panel from PCP.  4.  CKD stage IIIb, followed by nephrology.  Medication Adjustments/Labs and Tests Ordered: Current medicines are reviewed at length with the patient today.  Concerns regarding medicines are outlined above.   Tests Ordered: No orders of the defined types were placed in this encounter.   Medication Changes: No orders of the defined types were placed in this encounter.   Disposition:  Follow up  6 months.  Signed, Satira Sark, MD, [facility] 05/11/2022 11:47 AM    [facility] [place]. [address], [place], [place] [address] Phone:  [phone]; Fax: [phone]

## 2022-05-15 ENCOUNTER — Telehealth: Payer: Self-pay | Admitting: Physical Medicine and Rehabilitation

## 2022-05-15 NOTE — Telephone Encounter (Signed)
Pt's daughter called to cancel appt. Pt's daughter was agitated for some reason because Malva Cogan was on another line with another pt and could not speak to her right then. She told mer to cancel. I explained to pt Dr. [employer] [facility] team are the only ones that can make the changes, Pt's daughter Tobi Bastos stay she has an appt tomorrow and will go to Dr. Alvester Morin office. Phone number is [phone].

## 2022-05-16 DIAGNOSIS — M25651 Stiffness of right hip, not elsewhere classified: Secondary | ICD-10-CM | POA: Diagnosis not present

## 2022-05-16 DIAGNOSIS — R262 Difficulty in walking, not elsewhere classified: Secondary | ICD-10-CM | POA: Diagnosis not present

## 2022-05-16 DIAGNOSIS — M5441 Lumbago with sciatica, right side: Secondary | ICD-10-CM | POA: Diagnosis not present

## 2022-05-16 DIAGNOSIS — M25551 Pain in right hip: Secondary | ICD-10-CM | POA: Diagnosis not present

## 2022-05-16 DIAGNOSIS — M5431 Sciatica, right side: Secondary | ICD-10-CM | POA: Diagnosis not present

## 2022-05-21 DIAGNOSIS — M5431 Sciatica, right side: Secondary | ICD-10-CM | POA: Diagnosis not present

## 2022-05-21 DIAGNOSIS — M5441 Lumbago with sciatica, right side: Secondary | ICD-10-CM | POA: Diagnosis not present

## 2022-05-21 DIAGNOSIS — M25651 Stiffness of right hip, not elsewhere classified: Secondary | ICD-10-CM | POA: Diagnosis not present

## 2022-05-21 DIAGNOSIS — R262 Difficulty in walking, not elsewhere classified: Secondary | ICD-10-CM | POA: Diagnosis not present

## 2022-05-21 DIAGNOSIS — M25551 Pain in right hip: Secondary | ICD-10-CM | POA: Diagnosis not present

## 2022-05-23 ENCOUNTER — Ambulatory Visit: Payer: Medicare Other | Admitting: Physical Medicine and Rehabilitation

## 2022-05-28 DIAGNOSIS — R262 Difficulty in walking, not elsewhere classified: Secondary | ICD-10-CM | POA: Diagnosis not present

## 2022-05-28 DIAGNOSIS — M5431 Sciatica, right side: Secondary | ICD-10-CM | POA: Diagnosis not present

## 2022-05-28 DIAGNOSIS — M5441 Lumbago with sciatica, right side: Secondary | ICD-10-CM | POA: Diagnosis not present

## 2022-05-28 DIAGNOSIS — M25551 Pain in right hip: Secondary | ICD-10-CM | POA: Diagnosis not present

## 2022-05-28 DIAGNOSIS — M25651 Stiffness of right hip, not elsewhere classified: Secondary | ICD-10-CM | POA: Diagnosis not present

## 2022-05-30 ENCOUNTER — Ambulatory Visit: Payer: Self-pay

## 2022-05-30 ENCOUNTER — Encounter: Payer: Self-pay | Admitting: Physical Medicine and Rehabilitation

## 2022-05-30 ENCOUNTER — Ambulatory Visit (INDEPENDENT_AMBULATORY_CARE_PROVIDER_SITE_OTHER): Payer: Medicare Other | Admitting: Physical Medicine and Rehabilitation

## 2022-05-30 VITALS — BP 118/71 | HR 68 | Ht 65.0 in | Wt 196.0 lb

## 2022-05-30 DIAGNOSIS — M5416 Radiculopathy, lumbar region: Secondary | ICD-10-CM

## 2022-05-30 MED ORDER — METHYLPREDNISOLONE ACETATE 80 MG/ML IJ SUSP
40.0000 mg | Freq: Once | INTRAMUSCULAR | Status: AC
  Administered 2022-05-30: 40 mg

## 2022-05-30 NOTE — Patient Instructions (Signed)

## 2022-05-30 NOTE — Progress Notes (Signed)
Numeric Pain Rating Scale and Functional Assessment Average Pain 7   In the last MONTH (on 0-10 scale) has pain interfered with the following?  1. General activity like being  able to carry out your everyday physical activities such as walking, climbing stairs, carrying groceries, or moving a chair?  Rating(7) Pain radiating down right leg today.  +Driver, -BT, -Dye Allergies.

## 2022-06-06 DIAGNOSIS — M25651 Stiffness of right hip, not elsewhere classified: Secondary | ICD-10-CM | POA: Diagnosis not present

## 2022-06-06 DIAGNOSIS — M5431 Sciatica, right side: Secondary | ICD-10-CM | POA: Diagnosis not present

## 2022-06-06 DIAGNOSIS — M5441 Lumbago with sciatica, right side: Secondary | ICD-10-CM | POA: Diagnosis not present

## 2022-06-06 DIAGNOSIS — R262 Difficulty in walking, not elsewhere classified: Secondary | ICD-10-CM | POA: Diagnosis not present

## 2022-06-06 DIAGNOSIS — M25551 Pain in right hip: Secondary | ICD-10-CM | POA: Diagnosis not present

## 2022-06-06 NOTE — Progress Notes (Signed)
[name] - 88 y.o. female MRN [id number]  Date of birth: 1934-01-16  Office Visit Note: Visit Date: 05/30/2022 PCP: [facility] Referred by: [facility]  Subjective: Chief Complaint  Patient presents with   Right Leg - Pain   HPI:  [name] is a 88 y.o. female who comes in today for planned Right L4-5 Lumbar Transforaminal epidural steroid injection with fluoroscopic guidance.  The patient has failed conservative care including home exercise, medications, time and activity modification.  This injection will be diagnostic and hopefully therapeutic.  Please see requesting physician notes for further details and justification.  Prior injection was from an interlaminar approach and did give her some relief temporarily.  She does have significant stenosis at L4-5.  This will be a transforaminal approach at the level of stenosis.  Hopefully this will get her some relief that is long-lasting.   ROS Otherwise per HPI.  Assessment & Plan: Visit Diagnoses:    ICD-10-CM   1. Lumbar radiculopathy  M54.16 XR C-ARM NO REPORT    Epidural Steroid injection    methylPREDNISolone acetate (DEPO-MEDROL) injection 40 mg      Plan: No additional findings.   Meds & Orders:  Meds ordered this encounter  Medications   methylPREDNISolone acetate (DEPO-MEDROL) injection 40 mg    Orders Placed This Encounter  Procedures   XR C-ARM NO REPORT   Epidural Steroid injection    Follow-up: Return if symptoms worsen or fail to improve.   Procedures: No procedures performed      Clinical History: EXAM: MRI LUMBAR SPINE WITHOUT CONTRAST   TECHNIQUE: Multiplanar, multisequence MR imaging of the lumbar spine was performed. No intravenous contrast was administered.   COMPARISON:  Lumbar spine radiographs 02/13/2022, CT abdomen/pelvis 09/26/2018   FINDINGS: Segmentation: Standard; the lowest formed disc space is designated L5-S1.   Alignment: There is grade 1  anterolisthesis of L3 on L4 and L4 on L5 and trace retrolisthesis of L5 on S1.   Vertebrae: Vertebral body heights are preserved. Background marrow signal is normal. There is a prominent intraosseous hemangioma in the T11 vertebral body, unchanged since the prior CT from 2020. There are scattered small Schmorl's nodes without associated edema. There is no suspicious marrow signal abnormality or marrow edema.   Conus medullaris and cauda equina: Conus extends to the L1 level. Conus and cauda equina appear normal.   Paraspinal and other soft tissues: T2 hyperintense lesions in the kidneys most likely reflect benign cysts for which no specific imaging follow-up is required. The paraspinal soft tissues are unremarkable.   Disc levels:   There is advanced disc desiccation and narrowing at L5-S1. There is mild disc degeneration at the other levels.   T12-L1: There is a mild disc bulge and mild bilateral facet arthropathy resulting in mild left worse than right neural foraminal stenosis without significant spinal canal stenosis   L1-L2: There is a mild disc bulge and mild facet arthropathy without significant spinal canal or neural foraminal stenosis   L2-L3: There is a mild disc bulge eccentric to the left and mild bilateral facet arthropathy without significant spinal canal or neural foraminal stenosis   L3-L4: There is grade 1 anterolisthesis with a diffuse disc bulge and advanced bilateral facet arthropathy with ligamentum flavum thickening resulting in severe spinal canal stenosis with cauda equina nerve root compression and moderate right and mild left neural foraminal stenosis   L4-L5: There is grade 1 anterolisthesis with a disc bulge eccentric to  the right and advanced bilateral facet arthropathy with ligamentum flavum thickening resulting in severe spinal canal stenosis with cauda equina nerve root compression and severe right worse than left neural foraminal stenosis    L5-S1: There is advanced disc desiccation and narrowing with diffuse disc bulge and moderate bilateral facet arthropathy with trace effusions resulting in effacement of the bilateral subarticular zones (left worse than right) with potential impingement of either traversing S1 nerve root and moderate bilateral neural foraminal stenosis.   IMPRESSION: 1. Grade 1 anterolisthesis of L3 on L4 and L4 on L5 with associated disc bulges and advanced facet arthropathy resulting in severe spinal canal stenosis at both levels, and moderate right and mild left neural foraminal stenosis at L3-L4 and severe right worse than left neural foraminal stenosis at L4-L5. 2. Advanced disc degeneration with a bulge and bilateral facet arthropathy at L5-S1 resulting in effacement of the subarticular zones, left worse than right, with potential impingement of either traversing S1 nerve root and moderate bilateral neural foraminal stenosis.     Electronically Signed   By: Valetta Mole M.D.   On: 04/24/2022 08:48     Objective:  VS:  HT:5\' 5"  (165.1 cm)   WT:196 lb (88.9 kg)  BMI:32.62    BP:118/71  HR:68bpm  TEMP: ( )  RESP:  Physical Exam Vitals and nursing note reviewed.  Constitutional:      General: She is not in acute distress.    Appearance: Normal appearance. She is not ill-appearing.  HENT:     Head: Normocephalic and atraumatic.     Right Ear: External ear normal.     Left Ear: External ear normal.  Eyes:     Extraocular Movements: Extraocular movements intact.  Cardiovascular:     Rate and Rhythm: Normal rate.     Pulses: Normal pulses.  Pulmonary:     Effort: Pulmonary effort is normal. No respiratory distress.  Abdominal:     General: There is no distension.     Palpations: Abdomen is soft.  Musculoskeletal:        General: Tenderness present.     Cervical back: Neck supple.     Right lower leg: No edema.     Left lower leg: No edema.     Comments: Patient has good distal  strength with no pain over the greater trochanters.  No clonus or focal weakness.  Skin:    Findings: No erythema, lesion or rash.  Neurological:     General: No focal deficit present.     Mental Status: She is alert and oriented to person, place, and time.     Sensory: No sensory deficit.     Motor: No weakness or abnormal muscle tone.     Coordination: Coordination normal.  Psychiatric:        Mood and Affect: Mood normal.        Behavior: Behavior normal.      Imaging: No results found.

## 2022-06-06 NOTE — Procedures (Signed)
Lumbosacral Transforaminal Epidural Steroid Injection - Sub-Pedicular Approach with Fluoroscopic Guidance  Patient: [name]      Date of Birth: 1934/02/10 MRN: [id number] PCP: [facility]      Visit Date: 05/30/2022   Universal Protocol:    Date/Time: 05/30/2022  Consent Given By: the patient  Position: PRONE  Additional Comments: Vital signs were monitored before and after the procedure. Patient was prepped and draped in the usual sterile fashion. The correct patient, procedure, and site was verified.   Injection Procedure Details:   Procedure diagnoses: Lumbar radiculopathy [M54.16]    Meds Administered:  Meds ordered this encounter  Medications   methylPREDNISolone acetate (DEPO-MEDROL) injection 40 mg    Laterality: Right  Location/Site: L4  Needle:5.0 in., 22 ga.  Short bevel or Quincke spinal needle  Needle Placement: Transforaminal  Findings:    -Comments: Excellent flow of contrast along the nerve, nerve root and into the epidural space.  Procedure Details: After squaring off the end-plates to get a true AP view, the C-arm was positioned so that an oblique view of the foramen as noted above was visualized. The target area is just inferior to the "nose of the scotty dog" or sub pedicular. The soft tissues overlying this structure were infiltrated with 2-3 ml. of 1% Lidocaine without Epinephrine.  The spinal needle was inserted toward the target using a "trajectory" view along the fluoroscope beam.  Under AP and lateral visualization, the needle was advanced so it did not puncture dura and was located close the 6 O'Clock position of the pedical in AP tracterory. Biplanar projections were used to confirm position. Aspiration was confirmed to be negative for CSF and/or blood. A 1-2 ml. volume of Isovue-250 was injected and flow of contrast was noted at each level. Radiographs were obtained for documentation purposes.   After attaining the desired  flow of contrast documented above, a 0.5 to 1.0 ml test dose of 0.25% Marcaine was injected into each respective transforaminal space.  The patient was observed for 90 seconds post injection.  After no sensory deficits were reported, and normal lower extremity motor function was noted,   the above injectate was administered so that equal amounts of the injectate were placed at each foramen (level) into the transforaminal epidural space.   Additional Comments:  The patient tolerated the procedure well Dressing: 2 x 2 sterile gauze and Band-Aid    Post-procedure details: Patient was observed during the procedure. Post-procedure instructions were reviewed.  Patient left the clinic in stable condition.

## 2022-06-13 DIAGNOSIS — M5431 Sciatica, right side: Secondary | ICD-10-CM | POA: Diagnosis not present

## 2022-06-13 DIAGNOSIS — M25651 Stiffness of right hip, not elsewhere classified: Secondary | ICD-10-CM | POA: Diagnosis not present

## 2022-06-13 DIAGNOSIS — M5441 Lumbago with sciatica, right side: Secondary | ICD-10-CM | POA: Diagnosis not present

## 2022-06-13 DIAGNOSIS — R262 Difficulty in walking, not elsewhere classified: Secondary | ICD-10-CM | POA: Diagnosis not present

## 2022-06-13 DIAGNOSIS — M25551 Pain in right hip: Secondary | ICD-10-CM | POA: Diagnosis not present

## 2022-06-14 ENCOUNTER — Telehealth: Payer: Self-pay | Admitting: Physical Medicine and Rehabilitation

## 2022-06-14 NOTE — Telephone Encounter (Signed)
Patient daughter Vicente Males called advised the last time she called Dr. Ernestina Patches did not get the message to call her back.    Please see 05/15/2022 message that was sent to [facility].  [name] did not tell me what the message needed to be for Dr. Ernestina Patches concerning her mother.  The number to contact Vicente Males is [phone]

## 2022-06-15 DIAGNOSIS — M5441 Lumbago with sciatica, right side: Secondary | ICD-10-CM | POA: Diagnosis not present

## 2022-06-15 DIAGNOSIS — M25551 Pain in right hip: Secondary | ICD-10-CM | POA: Diagnosis not present

## 2022-06-15 DIAGNOSIS — R262 Difficulty in walking, not elsewhere classified: Secondary | ICD-10-CM | POA: Diagnosis not present

## 2022-06-15 DIAGNOSIS — M25651 Stiffness of right hip, not elsewhere classified: Secondary | ICD-10-CM | POA: Diagnosis not present

## 2022-06-15 DIAGNOSIS — M5431 Sciatica, right side: Secondary | ICD-10-CM | POA: Diagnosis not present

## 2022-06-20 DIAGNOSIS — M25561 Pain in right knee: Secondary | ICD-10-CM | POA: Diagnosis not present

## 2022-06-20 DIAGNOSIS — M170 Bilateral primary osteoarthritis of knee: Secondary | ICD-10-CM | POA: Diagnosis not present

## 2022-06-20 DIAGNOSIS — M25562 Pain in left knee: Secondary | ICD-10-CM | POA: Diagnosis not present

## 2022-06-22 DIAGNOSIS — R600 Localized edema: Secondary | ICD-10-CM | POA: Diagnosis not present

## 2022-06-27 DIAGNOSIS — M25551 Pain in right hip: Secondary | ICD-10-CM | POA: Diagnosis not present

## 2022-06-27 DIAGNOSIS — M5441 Lumbago with sciatica, right side: Secondary | ICD-10-CM | POA: Diagnosis not present

## 2022-06-27 DIAGNOSIS — M5431 Sciatica, right side: Secondary | ICD-10-CM | POA: Diagnosis not present

## 2022-06-27 DIAGNOSIS — R262 Difficulty in walking, not elsewhere classified: Secondary | ICD-10-CM | POA: Diagnosis not present

## 2022-06-27 DIAGNOSIS — M25511 Pain in right shoulder: Secondary | ICD-10-CM | POA: Diagnosis not present

## 2022-06-27 DIAGNOSIS — M25651 Stiffness of right hip, not elsewhere classified: Secondary | ICD-10-CM | POA: Diagnosis not present

## 2022-07-09 ENCOUNTER — Ambulatory Visit
Admission: EM | Admit: 2022-07-09 | Discharge: 2022-07-09 | Disposition: A | Discharge status: Home / Self Care | Payer: Medicare Other | Attending: Nurse Practitioner | Admitting: Nurse Practitioner

## 2022-07-09 DIAGNOSIS — B029 Zoster without complications: Secondary | ICD-10-CM | POA: Diagnosis not present

## 2022-07-09 DIAGNOSIS — S30820A Blister (nonthermal) of lower back and pelvis, initial encounter: Secondary | ICD-10-CM | POA: Diagnosis not present

## 2022-07-09 MED ORDER — VALACYCLOVIR HCL 1 G PO TABS: 1000.0000 mg | ORAL_TABLET | Freq: Two times a day (BID) | ORAL | 0 refills | Status: AC

## 2022-07-09 NOTE — Discharge Instructions (Signed)
The blisters on your buttock are consistent with shingles.  Please start Valtrex and take it twice daily for 7 days.  If the areas open up and starting draining, please keep them covered until they crust over and heal.  The rash may spread along the buttock for the next couple of days but should improve over the next week or so.  Please bring her back if she develops any new rash on her face.

## 2022-07-09 NOTE — ED Triage Notes (Signed)
Small blister on left buttocks. Notice it last night.

## 2022-07-09 NOTE — ED Provider Notes (Signed)
[facility]    CSN: [id number] Arrival date & time: 07/09/22  0834      History   Chief Complaint Chief Complaint  Patient presents with   Blister    Left hip    HPI [name] is a 88 y.o. female.   Patient presents with daughter for 2 blisters on her buttock that are painful and itchy.  Reports she noticed them last night and told her daughter this morning, so was brought in today.  Reports the area is slightly red around the blisters.  The skin is a little bit sensitive and itchy surrounding the blisters as well.  No active drainage, oozing, or pus.  No recent fevers, cough or congestion.  No shortness of breath, throat or tongue swelling, or new muscle pain or joint ache.  Patient has multiple other active concerns that she is following closely with her primary care provider including lower extremity swelling, conjunctivitis of left eye.  Was told to do warm compresses of the eye and to follow-up with ophthalmologist in 2 weeks.  No rash or sensitivity on the face or around the ear.      Past Medical History:  Diagnosis Date   Arthritis    CKD (chronic kidney disease), stage III (HCC)    Complete heart block (HCC) 11/16/2016   a. transient during NSTEMI, resolved with revascularization.   Coronary artery disease 11/2009   a. with prior stent placement to the ramus intermedius and RCA. b. NSTEMI 11/2016 s/p DES to DES to distal Cx with moderate residual dz.   CVA (cerebral vascular accident) [facility])    Essential hypertension    Mixed hyperlipidemia    Non-ST elevation (NSTEMI) myocardial infarction (HCC) 11/16/2016   Obesity    Osteoarthritis    Reflux esophagitis    Sleep apnea 09/27/2010   Untreated, REM 64.7/hr AHI 18.5/hr RDI 19.2/hr. Patuent refused CPAP therapy    Patient Active Problem List   Diagnosis Date Noted   Acute CVA (cerebrovascular accident) (HCC) 11/06/2021   Dyslipidemia 11/06/2021   GERD without esophagitis 11/06/2021   CKD (chronic  kidney disease), stage III (HCC) 11/23/2016   Pericardial effusion 11/23/2016   Complete heart block (HCC) 11/16/2016   Non-ST elevation (NSTEMI) myocardial infarction (HCC) 11/16/2016   Renal insufficiency 11/03/2015   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 09/06/2015   Uncontrolled hypertension 08/31/2015   HTN (hypertension), malignant    Symptomatic bradycardia    Hyperlipidemia    Epigastric fullness    Near syncope 08/28/2015   CAD S/P PCI    Essential hypertension 03/05/2013   Obesity (BMI 30-39.9) 03/05/2013   Hyperlipidemia with target LDL less than 70 03/05/2013   Obstructive sleep apnea 03/05/2013    Past Surgical History:  Procedure Laterality Date   CHOLECYSTECTOMY     CORONARY ANGIOPLASTY WITH STENT PLACEMENT  2007   A 3.0x33mm CYPHER stent post dilated to 3.29 mm the 100% occlusion was reduced to 0%   CORONARY STENT INTERVENTION N/A 11/16/2016   Procedure: Coronary Stent Intervention;  Surgeon: Tonny Bollman, MD;  Location: [facility] INVASIVE CV LAB;  Service: Cardiovascular;  Laterality: N/A;   LEFT HEART CATH AND CORONARY ANGIOGRAPHY N/A 11/16/2016   Procedure: Left Heart Cath and Coronary Angiography;  Surgeon: Tonny Bollman, MD;  Location: [facility] INVASIVE CV LAB;  Service: Cardiovascular;  Laterality: N/A;   LEFT HEART CATHETERIZATION WITH CORONARY ANGIOGRAM N/A 07/12/2014   Procedure: LEFT HEART CATHETERIZATION WITH CORONARY ANGIOGRAM;  Surgeon:  Micheline Chapman, MD;  Location: [facility] CATH LAB;  Service: Cardiovascular;  Laterality: N/A;   TEMPORARY PACEMAKER N/A 11/16/2016   Procedure: Temporary Pacemaker;  Surgeon: Tonny Bollman, MD;  Location: [facility] INVASIVE CV LAB;  Service: Cardiovascular;  Laterality: N/A;    OB History   No obstetric history on file.      Home Medications    Prior to Admission medications   Medication Sig Start Date End Date Taking? Authorizing Provider  acetaminophen (TYLENOL) 500 MG tablet Take 500 mg by mouth every 6  (six) hours as needed for mild pain or moderate pain.   Yes [provider]  amLODipine (NORVASC) 5 MG tablet TAKE 1 TABLET BY MOUTH EVERY DAY 04/05/22  Yes Jonelle Sidle, MD  aspirin 81 MG tablet Take 81 mg by mouth daily.   Yes [provider]  carvedilol (COREG) 6.25 MG tablet Take 6.25 mg by mouth 2 (two) times daily with a meal.   Yes [provider]  furosemide (LASIX) 20 MG tablet Take 20 mg by mouth daily as needed for fluid. 01/05/22  Yes [provider]  hydrALAZINE (APRESOLINE) 50 MG tablet TAKE 1 TABLET BY MOUTH TWICE A DAY 04/30/22  Yes Jonelle Sidle, MD  losartan (COZAAR) 50 MG tablet TAKE 1 TABLET BY MOUTH EVERY DAY 04/05/22  Yes Jonelle Sidle, MD  nystatin cream (MYCOSTATIN) Apply to affected area 2 times daily 02/05/22  Yes Prosperi, Christian H, PA-C  pantoprazole (PROTONIX) 40 MG tablet TAKE 1 TABLET BY MOUTH EVERY DAY 04/30/22  Yes Jonelle Sidle, MD  rosuvastatin (CRESTOR) 40 MG tablet TAKE 1 TABLET BY MOUTH EVERY DAY 10/03/21  Yes Jonelle Sidle, MD  valACYclovir (VALTREX) 1000 MG tablet Take 1 tablet (1,000 mg total) by mouth 2 (two) times daily for 7 days. 07/09/22 07/16/22 Yes Valentino Nose, NP  cyclobenzaprine (FLEXERIL) 5 MG tablet Take 1 tablet (5 mg total) by mouth 3 (three) times daily as needed for muscle spasms. Do not drink alcohol or drive while taking this medication.  May cause drowsiness. 02/01/22   Particia Nearing, PA-C  fluticasone [facility]) 50 MCG/ACT nasal spray Place 2 sprays into both nostrils daily. 01/28/21   Wurst, [place], PA-C  gabapentin (NEURONTIN) 100 MG capsule SMARTSIG:1 Capsule(s) By Mouth 1-3 Times Daily Patient not taking: Reported on 05/11/2022 04/09/22   [provider]  Menthol, Topical Analgesic, (BIOFREEZE ROLL-ON) 4 % GEL Apply 1 application. topically daily as needed (pain).    [provider]  Menthol, Topical Analgesic, (BIOFREEZE) 4 % GEL Apply 1 patch  topically daily as needed (knee pain).    [provider]  methocarbamol (ROBAXIN) 500 MG tablet Take 1 tablet (500 mg total) by mouth every 6 (six) hours as needed for muscle spasms. 03/07/22   Adonis Huguenin, NP  nitroGLYCERIN (NITROSTAT) 0.4 MG SL tablet PLACE 1 TABLET UNDER THE TONGUE EVERY 5 MINUTES AS NEEDED. 10/26/21   Jonelle Sidle, MD  ondansetron (ZOFRAN) 4 MG tablet Take 4 mg by mouth every 8 (eight) hours as needed for vomiting or nausea. 10/26/21   [provider]  oxyCODONE-acetaminophen (PERCOCET/ROXICET) 5-325 MG tablet Take 1 tablet by mouth every 8 (eight) hours as needed for severe pain.    [provider]  predniSONE (DELTASONE) 10 MG tablet Take 2 tablets (20 mg total) by mouth daily with breakfast. 02/27/22   Nadara Mustard, MD  predniSONE (DELTASONE) 20 MG tablet Take 2 tablets (40 mg total)  by mouth daily. Do not begin taking until 5/30 02/05/22   Prosperi, Christian H, PA-C  Vitamin D, Ergocalciferol, (DRISDOL) 1.25 MG (50000 UNIT) CAPS capsule Take 50,000 Units by mouth every 30 (thirty) days. 12/08/21   [provider]    Family History Family History  Problem Relation Age of Onset   Hypertension Mother    Aneurysm Mother        died at age 60   Other Father        unsure of medical history   Hypertension Other     Social History Social History   Tobacco Use   Smoking status: Never   Smokeless tobacco: Never  Vaping Use   Vaping Use: Never used  Substance Use Topics   Alcohol use: No   Drug use: No     Allergies   Atorvastatin, Contrast media [iodinated contrast media], Darvon [propoxyphene], and Codeine   Review of Systems Review of Systems Per HPI  Physical Exam Triage Vital Signs ED Triage Vitals [07/09/22 0918]  Enc Vitals Group     BP (!) 145/92     Pulse Rate 64     Resp 20     Temp 98.2 F (36.8 C)     Temp Source Oral     SpO2 95 %     Weight      Height      Head Circumference      Peak  Flow      Pain Score 0     Pain Loc      Pain Edu?      Excl. in [place]?    No data found.  Updated Vital Signs BP (!) 145/92 (BP Location: Left Arm)   Pulse 64   Temp 98.2 F (36.8 C) (Oral)   Resp 20   SpO2 95%   Visual Acuity Right Eye Distance:   Left Eye Distance:   Bilateral Distance:    Right Eye Near:   Left Eye Near:    Bilateral Near:     Physical Exam Vitals and nursing note reviewed.  Constitutional:      General: She is not in acute distress.    Appearance: Normal appearance. She is not toxic-appearing.  HENT:     Mouth/Throat:     Mouth: Mucous membranes are moist.     Pharynx: Oropharynx is clear.  Eyes:     General: No scleral icterus.       Right eye: No discharge.        Left eye: No discharge.     Extraocular Movements: Extraocular movements intact.     Right eye: Normal extraocular motion.     Left eye: Normal extraocular motion.     Conjunctiva/sclera: Conjunctivae normal.     Pupils: Pupils are equal, round, and reactive to light.     Comments: No rash around eyes  Pulmonary:     Effort: Pulmonary effort is normal. No respiratory distress.  Skin:    General: Skin is warm and dry.     Capillary Refill: Capillary refill takes less than 2 seconds.     Coloration: Skin is not jaundiced or pale.     Findings: No erythema.          Comments: 2 serosanguineous blisters to left buttock and approximately area marked.  There is slight surrounding erythema, however no fluctuance.  Blisters are intact.  There is itching/hypersensitivity of the skin lateral to the blisters.  Neurological:  Mental Status: She is alert and oriented to person, place, and time.  Psychiatric:        Behavior: Behavior is cooperative.      UC Treatments / Results  Labs (all labs ordered are listed, but only abnormal results are displayed) Labs Reviewed - No data to display  EKG   Radiology No results found.  Procedures Procedures (including critical care  time)  Medications Ordered in UC Medications - No data to display  Initial Impression / Assessment and Plan / UC Course  I have reviewed the triage vital signs and the nursing notes.  Pertinent labs & imaging results that were available during my care of the patient were reviewed by me and considered in my medical decision making (see chart for details).   Patient is well-appearing, normotensive, afebrile, not tachycardic, not tachypneic, oxygenating well on room air.    Blister of buttock, initial encounter Herpes zoster without complication Suspect herpes zoster Start Valtrex 1 g twice daily for 7 days-dose based on kidney function with creatinine clearance of 40 Discussed wound care if blisters open Supportive care discussed ER, return precautions discussed  The patient was given the opportunity to ask questions.  All questions answered to their satisfaction.  The patient is in agreement to this plan.    Final Clinical Impressions(s) / UC Diagnoses   Final diagnoses:  Blister of buttock, initial encounter  Herpes zoster without complication     Discharge Instructions      The blisters on your buttock are consistent with shingles.  Please start Valtrex and take it twice daily for 7 days.  If the areas open up and starting draining, please keep them covered until they crust over and heal.  The rash may spread along the buttock for the next couple of days but should improve over the next week or so.  Please bring her back if she develops any new rash on her face.      ED Prescriptions     Medication Sig Dispense Auth. Provider   valACYclovir (VALTREX) 1000 MG tablet Take 1 tablet (1,000 mg total) by mouth 2 (two) times daily for 7 days. 14 tablet Valentino Nose, NP      PDMP not reviewed this encounter.   Valentino Nose, NP 07/09/22 1017

## 2022-07-11 DIAGNOSIS — M170 Bilateral primary osteoarthritis of knee: Secondary | ICD-10-CM | POA: Diagnosis not present

## 2022-07-18 DIAGNOSIS — M170 Bilateral primary osteoarthritis of knee: Secondary | ICD-10-CM | POA: Diagnosis not present

## 2022-07-24 DIAGNOSIS — H00011 Hordeolum externum right upper eyelid: Secondary | ICD-10-CM | POA: Diagnosis not present

## 2022-07-26 ENCOUNTER — Telehealth: Payer: Self-pay | Admitting: Cardiology

## 2022-07-26 DIAGNOSIS — Z0279 Encounter for issue of other medical certificate: Secondary | ICD-10-CM

## 2022-07-26 NOTE — Telephone Encounter (Signed)
Forms from [name] for her employer on behalf of [name]  received on 07/26/2022. Completed patient authorization attached. Took form to MD box for completion. Fax to [place].  [facility] 07/26/2022

## 2022-07-27 NOTE — Telephone Encounter (Signed)
Form completed by Dr. Diona Browner 07/26/2022. [place] faxed them back 07/26/2022 Forms were faxed to [facility] 07/27/2022 Daugher Reva Pinkley was called and, told the forms are completed   [facility] 07/27/2022

## 2022-07-31 DIAGNOSIS — M1711 Unilateral primary osteoarthritis, right knee: Secondary | ICD-10-CM | POA: Diagnosis not present

## 2022-08-08 DIAGNOSIS — Z23 Encounter for immunization: Secondary | ICD-10-CM | POA: Diagnosis not present

## 2022-08-11 ENCOUNTER — Ambulatory Visit
Admission: EM | Admit: 2022-08-11 | Discharge: 2022-08-11 | Disposition: A | Discharge status: Home / Self Care | Payer: Medicare Other | Attending: Family Medicine | Admitting: Family Medicine

## 2022-08-11 DIAGNOSIS — Z79899 Other long term (current) drug therapy: Secondary | ICD-10-CM | POA: Diagnosis not present

## 2022-08-11 DIAGNOSIS — R059 Cough, unspecified: Secondary | ICD-10-CM | POA: Diagnosis not present

## 2022-08-11 DIAGNOSIS — J069 Acute upper respiratory infection, unspecified: Secondary | ICD-10-CM | POA: Diagnosis not present

## 2022-08-11 DIAGNOSIS — Z1152 Encounter for screening for COVID-19: Secondary | ICD-10-CM | POA: Insufficient documentation

## 2022-08-11 LAB — RESP PANEL BY RT-PCR (FLU A&B, COVID) ARPGX2
Influenza A by PCR: NEGATIVE
Influenza B by PCR: NEGATIVE
SARS Coronavirus 2 by RT PCR: NEGATIVE

## 2022-08-11 MED ORDER — AZELASTINE HCL 0.1 % NA SOLN: 1.0000 | Freq: Two times a day (BID) | NASAL | 0 refills | Status: DC

## 2022-08-11 MED ORDER — GUAIFENESIN ER 600 MG PO TB12: 600.0000 mg | ORAL_TABLET | Freq: Two times a day (BID) | ORAL | 0 refills | Status: DC

## 2022-08-11 MED ORDER — BENZONATATE 200 MG PO CAPS: 200.0000 mg | ORAL_CAPSULE | Freq: Three times a day (TID) | ORAL | 0 refills | Status: DC | PRN

## 2022-08-11 NOTE — ED Provider Notes (Signed)
[facility]    CSN: [id number] Arrival date & time: 08/11/22  6761      History   Chief Complaint No chief complaint on file.   HPI [name] is a 88 y.o. female.   Presenting today with 1 day history of sinus drainage, runny nose, scratchy throat, cough, chills, aches.  Denies chest pain, shortness of breath, abdominal pain, nausea vomiting or diarrhea.  Several sick contacts recently.  Taking Allegra and Flonase with no relief.  History of seasonal allergies, no known history of chronic pulmonary disease.    Past Medical History:  Diagnosis Date   Arthritis    CKD (chronic kidney disease), stage III (HCC)    Complete heart block (HCC) 11/16/2016   a. transient during NSTEMI, resolved with revascularization.   Coronary artery disease 11/2009   a. with prior stent placement to the ramus intermedius and RCA. b. NSTEMI 11/2016 s/p DES to DES to distal Cx with moderate residual dz.   CVA (cerebral vascular accident) [facility])    Essential hypertension    Mixed hyperlipidemia    Non-ST elevation (NSTEMI) myocardial infarction (HCC) 11/16/2016   Obesity    Osteoarthritis    Reflux esophagitis    Sleep apnea 09/27/2010   Untreated, REM 64.7/hr AHI 18.5/hr RDI 19.2/hr. Patuent refused CPAP therapy    Patient Active Problem List   Diagnosis Date Noted   Acute CVA (cerebrovascular accident) (HCC) 11/06/2021   Dyslipidemia 11/06/2021   GERD without esophagitis 11/06/2021   CKD (chronic kidney disease), stage III (HCC) 11/23/2016   Pericardial effusion 11/23/2016   Complete heart block (HCC) 11/16/2016   Non-ST elevation (NSTEMI) myocardial infarction (HCC) 11/16/2016   Renal insufficiency 11/03/2015   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 09/06/2015   Uncontrolled hypertension 08/31/2015   HTN (hypertension), malignant    Symptomatic bradycardia    Hyperlipidemia    Epigastric fullness    Near syncope 08/28/2015   CAD S/P PCI     Essential hypertension 03/05/2013   Obesity (BMI 30-39.9) 03/05/2013   Hyperlipidemia with target LDL less than 70 03/05/2013   Obstructive sleep apnea 03/05/2013    Past Surgical History:  Procedure Laterality Date   CHOLECYSTECTOMY     CORONARY ANGIOPLASTY WITH STENT PLACEMENT  2007   A 3.0x78mm CYPHER stent post dilated to 3.29 mm the 100% occlusion was reduced to 0%   CORONARY STENT INTERVENTION N/A 11/16/2016   Procedure: Coronary Stent Intervention;  Surgeon: Tonny Bollman, MD;  Location: [facility] INVASIVE CV LAB;  Service: Cardiovascular;  Laterality: N/A;   LEFT HEART CATH AND CORONARY ANGIOGRAPHY N/A 11/16/2016   Procedure: Left Heart Cath and Coronary Angiography;  Surgeon: Tonny Bollman, MD;  Location: [facility] INVASIVE CV LAB;  Service: Cardiovascular;  Laterality: N/A;   LEFT HEART CATHETERIZATION WITH CORONARY ANGIOGRAM N/A 07/12/2014   Procedure: LEFT HEART CATHETERIZATION WITH CORONARY ANGIOGRAM;  Surgeon: Micheline Chapman, MD;  Location: [facility] CATH LAB;  Service: Cardiovascular;  Laterality: N/A;   TEMPORARY PACEMAKER N/A 11/16/2016   Procedure: Temporary Pacemaker;  Surgeon: Tonny Bollman, MD;  Location: [facility] INVASIVE CV LAB;  Service: Cardiovascular;  Laterality: N/A;    OB History   No obstetric history on file.      Home Medications    Prior to Admission medications   Medication Sig Start Date End Date Taking? Authorizing Provider  azelastine (ASTELIN) 0.1 % nasal spray Place 1 spray into both nostrils 2 (two) times daily. Use in each nostril  as directed 08/11/22  Yes Particia Nearing, PA-C  benzonatate (TESSALON) 200 MG capsule Take 1 capsule (200 mg total) by mouth 3 (three) times daily as needed for cough. 08/11/22  Yes Particia Nearing, PA-C  guaiFENesin (MUCINEX) 600 MG 12 hr tablet Take 1 tablet (600 mg total) by mouth 2 (two) times daily. 08/11/22  Yes Particia Nearing, PA-C  acetaminophen (TYLENOL) 500 MG tablet Take 500 mg by mouth every 6 (six) hours as  needed for mild pain or moderate pain.    [provider]  amLODipine (NORVASC) 5 MG tablet TAKE 1 TABLET BY MOUTH EVERY DAY 04/05/22   Jonelle Sidle, MD  aspirin 81 MG tablet Take 81 mg by mouth daily.    [provider]  carvedilol (COREG) 6.25 MG tablet Take 6.25 mg by mouth 2 (two) times daily with a meal.    [provider]  cyclobenzaprine (FLEXERIL) 5 MG tablet Take 1 tablet (5 mg total) by mouth 3 (three) times daily as needed for muscle spasms. Do not drink alcohol or drive while taking this medication.  May cause drowsiness. 02/01/22   Particia Nearing, PA-C  fluticasone [facility]) 50 MCG/ACT nasal spray Place 2 sprays into both nostrils daily. 01/28/21   Wurst, [place], PA-C  furosemide (LASIX) 20 MG tablet Take 20 mg by mouth daily as needed for fluid. 01/05/22   [provider]  gabapentin (NEURONTIN) 100 MG capsule SMARTSIG:1 Capsule(s) By Mouth 1-3 Times Daily Patient not taking: Reported on 05/11/2022 04/09/22   [provider]  hydrALAZINE (APRESOLINE) 50 MG tablet TAKE 1 TABLET BY MOUTH TWICE A DAY 04/30/22   Jonelle Sidle, MD  losartan (COZAAR) 50 MG tablet TAKE 1 TABLET BY MOUTH EVERY DAY 04/05/22   Jonelle Sidle, MD  Menthol, Topical Analgesic, (BIOFREEZE ROLL-ON) 4 % GEL Apply 1 application. topically daily as needed (pain).    [provider]  Menthol, Topical Analgesic, (BIOFREEZE) 4 % GEL Apply 1 patch topically daily as needed (knee pain).    [provider]  methocarbamol (ROBAXIN) 500 MG tablet Take 1 tablet (500 mg total) by mouth every 6 (six) hours as needed for muscle spasms. 03/07/22   Adonis Huguenin, NP  nitroGLYCERIN (NITROSTAT) 0.4 MG SL tablet PLACE 1 TABLET UNDER THE TONGUE EVERY 5 MINUTES AS NEEDED. 10/26/21   Jonelle Sidle, MD  nystatin cream (MYCOSTATIN) Apply to affected area 2 times daily 02/05/22   Prosperi, Christian H, PA-C  ondansetron (ZOFRAN) 4 MG tablet Take 4 mg by mouth  every 8 (eight) hours as needed for vomiting or nausea. 10/26/21   [provider]  oxyCODONE-acetaminophen (PERCOCET/ROXICET) 5-325 MG tablet Take 1 tablet by mouth every 8 (eight) hours as needed for severe pain.    [provider]  pantoprazole (PROTONIX) 40 MG tablet TAKE 1 TABLET BY MOUTH EVERY DAY 04/30/22   Jonelle Sidle, MD  predniSONE (DELTASONE) 10 MG tablet Take 2 tablets (20 mg total) by mouth daily with breakfast. 02/27/22   Nadara Mustard, MD  predniSONE (DELTASONE) 20 MG tablet Take 2 tablets (40 mg total) by mouth daily. Do not begin taking until 5/30 02/05/22   Prosperi, Christian H, PA-C  rosuvastatin (CRESTOR) 40 MG tablet TAKE 1 TABLET BY MOUTH EVERY DAY 10/03/21   Jonelle Sidle, MD  Vitamin D, Ergocalciferol, (DRISDOL) 1.25 MG (50000 UNIT) CAPS capsule Take 50,000 Units by mouth every 30 (thirty) days. 12/08/21   [provider]  Family History Family History  Problem Relation Age of Onset   Hypertension Mother    Aneurysm Mother        died at age 60   Other Father        unsure of medical history   Hypertension Other     Social History Social History   Tobacco Use   Smoking status: Never   Smokeless tobacco: Never  Vaping Use   Vaping Use: Never used  Substance Use Topics   Alcohol use: No   Drug use: No     Allergies   Atorvastatin, Contrast media [iodinated contrast media], Darvon [propoxyphene], and Codeine   Review of Systems Review of Systems Per HPI  Physical Exam Triage Vital Signs ED Triage Vitals  Enc Vitals Group     BP 08/11/22 0831 (!) 149/77     Pulse Rate 08/11/22 0831 66     Resp 08/11/22 0831 20     Temp 08/11/22 0831 99 F (37.2 C)     Temp Source 08/11/22 0831 Oral     SpO2 08/11/22 0831 98 %     Weight --      Height --      Head Circumference --      Peak Flow --      Pain Score 08/11/22 0840 0     Pain Loc --      Pain Edu? --      Excl. in [place]? --    No data found.  Updated  Vital Signs BP (!) 149/77 (BP Location: Left Arm)   Pulse 66   Temp 99 F (37.2 C) (Oral)   Resp 20   SpO2 98%   Visual Acuity Right Eye Distance:   Left Eye Distance:   Bilateral Distance:    Right Eye Near:   Left Eye Near:    Bilateral Near:     Physical Exam Vitals and nursing note reviewed.  Constitutional:      Appearance: Normal appearance.  HENT:     Head: Atraumatic.     Right Ear: Tympanic membrane and external ear normal.     Left Ear: Tympanic membrane and external ear normal.     Nose: Rhinorrhea present.     Mouth/Throat:     Mouth: Mucous membranes are moist.     Pharynx: Posterior oropharyngeal erythema present.  Eyes:     Extraocular Movements: Extraocular movements intact.     Conjunctiva/sclera: Conjunctivae normal.  Cardiovascular:     Rate and Rhythm: Normal rate and regular rhythm.     Heart sounds: Normal heart sounds.  Pulmonary:     Effort: Pulmonary effort is normal. No respiratory distress.     Breath sounds: Normal breath sounds. No wheezing or rales.  Musculoskeletal:        General: Normal range of motion.     Cervical back: Normal range of motion and neck supple.  Skin:    General: Skin is warm and dry.  Neurological:     Mental Status: She is alert and oriented to person, place, and time.  Psychiatric:        Mood and Affect: Mood normal.        Thought Content: Thought content normal.      UC Treatments / Results  Labs (all labs ordered are listed, but only abnormal results are displayed) Labs Reviewed  RESP PANEL BY RT-PCR (FLU A&B, COVID) [id number]    EKG   Radiology No results found.  Procedures  Procedures (including critical care time)  Medications Ordered in UC Medications - No data to display  Initial Impression / Assessment and Plan / UC Course  I have reviewed the triage vital signs and the nursing notes.  Pertinent labs & imaging results that were available during my care of the patient were reviewed by  me and considered in my medical decision making (see chart for details).     Minimally hypertensive in triage, otherwise vital signs benign and within normal limits.  Exam overall very reassuring and suggestive of a viral upper respiratory infection.  COVID and flu testing pending, good candidate for antiviral therapy if positive for either.  In the meantime, will treat symptoms with Astelin, Tessalon Perles, Mucinex and provided a list of safe over-the-counter options for symptomatic benefit.  Return for any worsening symptoms.  Final Clinical Impressions(s) / UC Diagnoses   Final diagnoses:  Viral URI with cough     Discharge Instructions      You may continue to keep your allergy medication, nasal sprays and you may take Coricidin HBP for cough and congestion in addition to the prescriptions that I have sent over for you which include plain Mucinex, a drying nasal spray and cough Perles.  We have tested you for COVID and flu and if positive for either 1 we will discuss antiviral therapy targeted to which ever you are positive for.    ED Prescriptions     Medication Sig Dispense Auth. Provider   azelastine (ASTELIN) 0.1 % nasal spray Place 1 spray into both nostrils 2 (two) times daily. Use in each nostril as directed 30 mL Particia NearingLane, Jashiya Bassett [place], PA-C   benzonatate (TESSALON) 200 MG capsule Take 1 capsule (200 mg total) by mouth 3 (three) times daily as needed for cough. 20 capsule Particia NearingLane, Karell Tukes [place], PA-C   guaiFENesin (MUCINEX) 600 MG 12 hr tablet Take 1 tablet (600 mg total) by mouth 2 (two) times daily. 20 tablet Particia NearingLane, Willer Osorno Elizabeth, [place]PA-C      PDMP not reviewed this encounter.   Roosvelt MaserLane, Jereme Loren Lake VillageElizabeth, [place] JerseyPA-C 08/11/22 [id number]

## 2022-08-11 NOTE — ED Triage Notes (Signed)
Pt reports she has some sinus drainage yesterday. Her throat feels sore and she has been coughing. Took allegra and Flonase which didn't provide any relief.

## 2022-08-11 NOTE — Discharge Instructions (Signed)
You may continue to keep your allergy medication, nasal sprays and you may take Coricidin HBP for cough and congestion in addition to the prescriptions that I have sent over for you which include plain Mucinex, a drying nasal spray and cough Perles.  We have tested you for COVID and flu and if positive for either 1 we will discuss antiviral therapy targeted to which ever you are positive for.

## 2022-08-15 DIAGNOSIS — J208 Acute bronchitis due to other specified organisms: Secondary | ICD-10-CM | POA: Diagnosis not present

## 2022-08-15 DIAGNOSIS — R600 Localized edema: Secondary | ICD-10-CM | POA: Diagnosis not present

## 2022-08-15 DIAGNOSIS — R0602 Shortness of breath: Secondary | ICD-10-CM | POA: Diagnosis not present

## 2022-08-15 DIAGNOSIS — Z6833 Body mass index (BMI) 33.0-33.9, adult: Secondary | ICD-10-CM | POA: Diagnosis not present

## 2022-08-20 ENCOUNTER — Other Ambulatory Visit: Payer: Self-pay

## 2022-08-20 ENCOUNTER — Encounter (HOSPITAL_COMMUNITY): Payer: Self-pay

## 2022-08-20 ENCOUNTER — Inpatient Hospital Stay (HOSPITAL_COMMUNITY)
Admission: EM | Admit: 2022-08-20 | Discharge: 2022-08-21 | DRG: 065 | Disposition: A | Discharge status: Home / Self Care | Payer: Medicare Other | Attending: Family Medicine | Admitting: Family Medicine

## 2022-08-20 ENCOUNTER — Emergency Department (HOSPITAL_COMMUNITY): Payer: Medicare Other

## 2022-08-20 DIAGNOSIS — I639 Cerebral infarction, unspecified: Secondary | ICD-10-CM | POA: Diagnosis not present

## 2022-08-20 DIAGNOSIS — Z955 Presence of coronary angioplasty implant and graft: Secondary | ICD-10-CM

## 2022-08-20 DIAGNOSIS — I2510 Atherosclerotic heart disease of native coronary artery without angina pectoris: Secondary | ICD-10-CM | POA: Diagnosis present

## 2022-08-20 DIAGNOSIS — Z8673 Personal history of transient ischemic attack (TIA), and cerebral infarction without residual deficits: Secondary | ICD-10-CM

## 2022-08-20 DIAGNOSIS — E785 Hyperlipidemia, unspecified: Secondary | ICD-10-CM | POA: Diagnosis not present

## 2022-08-20 DIAGNOSIS — K219 Gastro-esophageal reflux disease without esophagitis: Secondary | ICD-10-CM | POA: Diagnosis present

## 2022-08-20 DIAGNOSIS — E782 Mixed hyperlipidemia: Secondary | ICD-10-CM | POA: Diagnosis not present

## 2022-08-20 DIAGNOSIS — I452 Bifascicular block: Secondary | ICD-10-CM | POA: Diagnosis not present

## 2022-08-20 DIAGNOSIS — I129 Hypertensive chronic kidney disease with stage 1 through stage 4 chronic kidney disease, or unspecified chronic kidney disease: Secondary | ICD-10-CM | POA: Diagnosis present

## 2022-08-20 DIAGNOSIS — I252 Old myocardial infarction: Secondary | ICD-10-CM | POA: Diagnosis not present

## 2022-08-20 DIAGNOSIS — N1832 Chronic kidney disease, stage 3b: Secondary | ICD-10-CM | POA: Diagnosis present

## 2022-08-20 DIAGNOSIS — R4702 Dysphasia: Secondary | ICD-10-CM | POA: Diagnosis present

## 2022-08-20 DIAGNOSIS — R4701 Aphasia: Secondary | ICD-10-CM | POA: Diagnosis present

## 2022-08-20 DIAGNOSIS — Z8249 Family history of ischemic heart disease and other diseases of the circulatory system: Secondary | ICD-10-CM | POA: Diagnosis not present

## 2022-08-20 DIAGNOSIS — I10 Essential (primary) hypertension: Secondary | ICD-10-CM | POA: Diagnosis not present

## 2022-08-20 DIAGNOSIS — Z20822 Contact with and (suspected) exposure to covid-19: Secondary | ICD-10-CM | POA: Diagnosis present

## 2022-08-20 DIAGNOSIS — Z91041 Radiographic dye allergy status: Secondary | ICD-10-CM

## 2022-08-20 DIAGNOSIS — Z6832 Body mass index (BMI) 32.0-32.9, adult: Secondary | ICD-10-CM | POA: Diagnosis not present

## 2022-08-20 DIAGNOSIS — I6389 Other cerebral infarction: Secondary | ICD-10-CM | POA: Diagnosis not present

## 2022-08-20 DIAGNOSIS — E669 Obesity, unspecified: Secondary | ICD-10-CM | POA: Diagnosis present

## 2022-08-20 DIAGNOSIS — Z79899 Other long term (current) drug therapy: Secondary | ICD-10-CM

## 2022-08-20 DIAGNOSIS — N183 Chronic kidney disease, stage 3 unspecified: Secondary | ICD-10-CM | POA: Diagnosis present

## 2022-08-20 DIAGNOSIS — I6349 Cerebral infarction due to embolism of other cerebral artery: Principal | ICD-10-CM | POA: Diagnosis present

## 2022-08-20 DIAGNOSIS — Z7982 Long term (current) use of aspirin: Secondary | ICD-10-CM | POA: Diagnosis not present

## 2022-08-20 DIAGNOSIS — Z885 Allergy status to narcotic agent status: Secondary | ICD-10-CM | POA: Diagnosis not present

## 2022-08-20 DIAGNOSIS — I771 Stricture of artery: Secondary | ICD-10-CM | POA: Diagnosis not present

## 2022-08-20 DIAGNOSIS — R29703 NIHSS score 3: Secondary | ICD-10-CM | POA: Diagnosis present

## 2022-08-20 DIAGNOSIS — R29818 Other symptoms and signs involving the nervous system: Secondary | ICD-10-CM | POA: Diagnosis not present

## 2022-08-20 LAB — URINALYSIS, ROUTINE W REFLEX MICROSCOPIC
Bilirubin Urine: NEGATIVE
Glucose, UA: NEGATIVE mg/dL
Hgb urine dipstick: NEGATIVE
Ketones, ur: NEGATIVE mg/dL
Nitrite: NEGATIVE
Protein, ur: NEGATIVE mg/dL
Specific Gravity, Urine: 1.017 (ref 1.005–1.030)
pH: 5 (ref 5.0–8.0)

## 2022-08-20 LAB — COMPREHENSIVE METABOLIC PANEL
ALT: 20 U/L (ref 0–44)
AST: 27 U/L (ref 15–41)
Albumin: 3.2 g/dL — ABNORMAL LOW (ref 3.5–5.0)
Alkaline Phosphatase: 56 U/L (ref 38–126)
Anion gap: 7 (ref 5–15)
BUN: 17 mg/dL (ref 8–23)
CO2: 25 mmol/L (ref 22–32)
Calcium: 8.8 mg/dL — ABNORMAL LOW (ref 8.9–10.3)
Chloride: 107 mmol/L (ref 98–111)
Creatinine, Ser: 1.29 mg/dL — ABNORMAL HIGH (ref 0.44–1.00)
GFR, Estimated: 40 mL/min — ABNORMAL LOW (ref >60)
Glucose, Bld: 130 mg/dL — ABNORMAL HIGH (ref 70–99)
Potassium: 4.1 mmol/L (ref 3.5–5.1)
Sodium: 139 mmol/L (ref 135–145)
Total Bilirubin: 0.5 mg/dL (ref 0.3–1.2)
Total Protein: 7 g/dL (ref 6.5–8.1)

## 2022-08-20 LAB — DIFFERENTIAL
Abs Immature Granulocytes: 0.02 10*3/uL (ref 0.00–0.07)
Basophils Absolute: 0 10*3/uL (ref 0.0–0.1)
Basophils Relative: 1 %
Eosinophils Absolute: 0.1 10*3/uL (ref 0.0–0.5)
Eosinophils Relative: 2 %
Immature Granulocytes: 0 %
Lymphocytes Relative: 20 %
Lymphs Abs: 1.2 10*3/uL (ref 0.7–4.0)
Monocytes Absolute: 0.6 10*3/uL (ref 0.1–1.0)
Monocytes Relative: 10 %
Neutro Abs: 4 10*3/uL (ref 1.7–7.7)
Neutrophils Relative %: 67 %

## 2022-08-20 LAB — CBC
HCT: 38.3 % (ref 36.0–46.0)
Hemoglobin: 12.3 g/dL (ref 12.0–15.0)
MCH: 29.6 pg (ref 26.0–34.0)
MCHC: 32.1 g/dL (ref 30.0–36.0)
MCV: 92.3 fL (ref 80.0–100.0)
Platelets: 193 10*3/uL (ref 150–400)
RBC: 4.15 MIL/uL (ref 3.87–5.11)
RDW: 14.9 % (ref 11.5–15.5)
WBC: 6 10*3/uL (ref 4.0–10.5)
nRBC: 0 % (ref 0.0–0.2)

## 2022-08-20 LAB — RESP PANEL BY RT-PCR (RSV, FLU A&B, COVID)  RVPGX2
Influenza A by PCR: NEGATIVE
Influenza B by PCR: NEGATIVE
Resp Syncytial Virus by PCR: NEGATIVE
SARS Coronavirus 2 by RT PCR: NEGATIVE

## 2022-08-20 LAB — PROTIME-INR
INR: 1.1 (ref 0.8–1.2)
Prothrombin Time: 14.1 seconds (ref 11.4–15.2)

## 2022-08-20 LAB — CBG MONITORING, ED: Glucose-Capillary: 127 mg/dL — ABNORMAL HIGH (ref 70–99)

## 2022-08-20 LAB — ETHANOL: Alcohol, Ethyl (B): <10 mg/dL (ref <10)

## 2022-08-20 LAB — APTT: aPTT: 25 seconds (ref 24–36)

## 2022-08-20 MED ORDER — PANTOPRAZOLE SODIUM 40 MG PO TBEC
40.0000 mg | DELAYED_RELEASE_TABLET | Freq: Every day | ORAL | Status: DC
  Administered 2022-08-21: 40 mg via ORAL
  Filled 2022-08-20: qty 1

## 2022-08-20 MED ORDER — AMLODIPINE BESYLATE 5 MG PO TABS
5.0000 mg | ORAL_TABLET | Freq: Every day | ORAL | Status: DC
  Administered 2022-08-21: 5 mg via ORAL
  Filled 2022-08-20: qty 1

## 2022-08-20 MED ORDER — ROSUVASTATIN CALCIUM 20 MG PO TABS
40.0000 mg | ORAL_TABLET | Freq: Every day | ORAL | Status: DC
  Administered 2022-08-21: 40 mg via ORAL
  Filled 2022-08-20: qty 2

## 2022-08-20 MED ORDER — STROKE: EARLY STAGES OF RECOVERY BOOK
Freq: Once | Status: AC
  Filled 2022-08-20: qty 1

## 2022-08-20 MED ORDER — MENTHOL (TOPICAL ANALGESIC) 4 % EX GEL: 1.0000 | Freq: Every day | CUTANEOUS | Status: DC | PRN

## 2022-08-20 MED ORDER — NITROGLYCERIN 0.4 MG SL SUBL: 0.4000 mg | SUBLINGUAL_TABLET | SUBLINGUAL | Status: DC | PRN

## 2022-08-20 MED ORDER — LOSARTAN POTASSIUM 50 MG PO TABS
50.0000 mg | ORAL_TABLET | Freq: Every day | ORAL | Status: DC
  Administered 2022-08-21: 50 mg via ORAL
  Filled 2022-08-20: qty 2

## 2022-08-20 MED ORDER — HYDRALAZINE HCL 25 MG PO TABS
50.0000 mg | ORAL_TABLET | Freq: Two times a day (BID) | ORAL | Status: DC
  Administered 2022-08-20 – 2022-08-21 (×2): 50 mg via ORAL
  Filled 2022-08-20 (×2): qty 2

## 2022-08-20 MED ORDER — LORAZEPAM 1 MG PO TABS
1.0000 mg | ORAL_TABLET | Freq: Once | ORAL | Status: DC
  Filled 2022-08-20: qty 1

## 2022-08-20 MED ORDER — GUAIFENESIN ER 600 MG PO TB12
600.0000 mg | ORAL_TABLET | Freq: Two times a day (BID) | ORAL | Status: DC
  Administered 2022-08-20: 600 mg via ORAL
  Filled 2022-08-20 (×2): qty 1

## 2022-08-20 MED ORDER — BENZONATATE 100 MG PO CAPS: 200.0000 mg | ORAL_CAPSULE | Freq: Three times a day (TID) | ORAL | Status: DC | PRN

## 2022-08-20 MED ORDER — ONDANSETRON HCL 4 MG/2ML IJ SOLN: 4.0000 mg | Freq: Four times a day (QID) | INTRAMUSCULAR | Status: DC | PRN

## 2022-08-20 MED ORDER — ACETAMINOPHEN 650 MG RE SUPP: 650.0000 mg | Freq: Four times a day (QID) | RECTAL | Status: DC | PRN

## 2022-08-20 MED ORDER — SODIUM CHLORIDE 0.9 % IV SOLN: INTRAVENOUS | Status: DC

## 2022-08-20 MED ORDER — ENOXAPARIN SODIUM 40 MG/0.4ML IJ SOSY
40.0000 mg | PREFILLED_SYRINGE | INTRAMUSCULAR | Status: DC
  Administered 2022-08-20: 40 mg via SUBCUTANEOUS
  Filled 2022-08-20: qty 0.4

## 2022-08-20 MED ORDER — FUROSEMIDE 20 MG PO TABS: 20.0000 mg | ORAL_TABLET | Freq: Every day | ORAL | Status: DC | PRN

## 2022-08-20 MED ORDER — ONDANSETRON HCL 4 MG PO TABS: 4.0000 mg | ORAL_TABLET | Freq: Four times a day (QID) | ORAL | Status: DC | PRN

## 2022-08-20 MED ORDER — ACETAMINOPHEN 325 MG PO TABS: 650.0000 mg | ORAL_TABLET | Freq: Four times a day (QID) | ORAL | Status: DC | PRN

## 2022-08-20 MED ORDER — ASPIRIN 81 MG PO CHEW
81.0000 mg | CHEWABLE_TABLET | Freq: Every day | ORAL | Status: DC
  Administered 2022-08-21: 81 mg via ORAL
  Filled 2022-08-20: qty 1

## 2022-08-20 MED ORDER — AZELASTINE HCL 0.1 % NA SOLN
1.0000 | Freq: Two times a day (BID) | NASAL | Status: DC
  Filled 2022-08-20: qty 30

## 2022-08-20 MED ORDER — MUSCLE RUB 10-15 % EX CREA: TOPICAL_CREAM | CUTANEOUS | Status: DC | PRN

## 2022-08-20 MED ORDER — TRAZODONE HCL 50 MG PO TABS: 25.0000 mg | ORAL_TABLET | Freq: Every evening | ORAL | Status: DC | PRN

## 2022-08-20 MED ORDER — MENTHOL (TOPICAL ANALGESIC) 4 % EX GEL: 1.0000 "application " | Freq: Every day | CUTANEOUS | Status: DC | PRN

## 2022-08-20 MED ORDER — CARVEDILOL 3.125 MG PO TABS
6.2500 mg | ORAL_TABLET | Freq: Two times a day (BID) | ORAL | Status: DC
  Administered 2022-08-21: 6.25 mg via ORAL
  Filled 2022-08-20: qty 2

## 2022-08-20 MED ORDER — FLUTICASONE PROPIONATE 50 MCG/ACT NA SUSP
2.0000 | Freq: Every day | NASAL | Status: DC
  Filled 2022-08-20: qty 16

## 2022-08-20 MED ORDER — MAGNESIUM HYDROXIDE 400 MG/5ML PO SUSP: 30.0000 mL | Freq: Every day | ORAL | Status: DC | PRN

## 2022-08-20 NOTE — ED Triage Notes (Addendum)
Pt presents with a c/o of expressive aphasia. LKW was 8:30 am on 12/6 when she went to a Dr. appointment with her PCP. Pt states she has been having trouble saying the right words. Pt denies slurring of speech. Pt reports having a HA the last 2 days relieved with APAP. Pt has previously been evaluated by UC and her PCP for URI and was given steroids, benzonatate, guaifenesin.  Pt with a hx of CVA in February.   No dysarthria, weakness, or sensation deficits noted in triage. Pt noted to stutter with her words and having some expressive aphasia.

## 2022-08-20 NOTE — H&P (Signed)
[place]   PATIENT NAME: [name]    MR#:  [id number]  DATE OF BIRTH:  01-Aug-1934  DATE OF ADMISSION:  08/20/2022  PRIMARY CARE PHYSICIAN: [facility]   Patient is coming from: Home.  REQUESTING/REFERRING PHYSICIAN: Gloris Manchester, MD  CHIEF COMPLAINT:   Chief Complaint  Patient presents with   Aphasia         HISTORY OF PRESENT ILLNESS:  [name] is a 88 y.o. African-American female with medical history significant for osteoarthritis, CVA, hypertension, dyslipidemia, coronary artery disease, GERD, OSA and obesity, who presented to the emergency room with acute onset of expressive dysphasia that started about 5 days ago.  The patient lives at home alone and has been independent.  Her daughters live close to her and frequently check on her.  Her family noticed that last week she was having difficulty finding words and her symptoms persisted.  She has been having frontal headache over the last couple of days for which she has been taking Tylenol with improvement.  She was recently seen in urgent care and treated for URI.  This was before her symptoms started.  She had a CVA earlier this year without residual deficits.  No unilateral muscle weakness or paresthesias.  No gait abnormalities.  No visual loss, blurred vision or diplopia, tinnitus or vertigo.  No fever or chills.  No chest pain or palpitations.  No cough or wheezing.  No nausea or vomiting or abdominal pain.  No dysuria, oliguria or hematuria or flank pain.  ED Course: When she came to the ER BP was 151/81 with otherwise normal vital signs.  Labs revealed a creatinine 1.29 compared to 1.4 on 11/26/2021.  Albumin 3.2 and glucose 130 with otherwise unremarkable CMP.  CBC was within normal and coagulation profile was within normal. EKG as reviewed by me : EKG showed normal sinus rhythm with a rate of 72 with sinus arrhythmia with right bundle branch block and left intrafascicular block (bifascicular  block) Imaging: Noncontrasted CT scan revealed chronic small vessel ischemic changes with no acute intra abnormalities. Brain MRI with MRA of the head without contrast revealed the following: 1. Multifocal punctate acute ischemia in the left hemisphere, within multiple vascular territories. The greatest number are located in the medial left temporal lobe. Distribution is most consistent with embolic disease from the left carotid or distal aortic arch. 2. Normal intracranial MRA.  Dr. Wilford Corner was contacted about the patient and recommended admission here.  Teleneurology consult will be obtained in a.m.  The patient will be admitted to a medical telemetry bed for further evaluation and management. PAST MEDICAL HISTORY:   Past Medical History:  Diagnosis Date   Arthritis    CKD (chronic kidney disease), stage III (HCC)    Complete heart block (HCC) 11/16/2016   a. transient during NSTEMI, resolved with revascularization.   Coronary artery disease 11/2009   a. with prior stent placement to the ramus intermedius and RCA. b. NSTEMI 11/2016 s/p DES to DES to distal Cx with moderate residual dz.   CVA (cerebral vascular accident) [facility])    Essential hypertension    Mixed hyperlipidemia    Non-ST elevation (NSTEMI) myocardial infarction (HCC) 11/16/2016   Obesity    Osteoarthritis    Reflux esophagitis    Sleep apnea 09/27/2010   Untreated, REM 64.7/hr AHI 18.5/hr RDI 19.2/hr. Patuent refused CPAP therapy    PAST SURGICAL HISTORY:   Past Surgical History:  Procedure Laterality Date  CHOLECYSTECTOMY     CORONARY ANGIOPLASTY WITH STENT PLACEMENT  2007   A [id number] CYPHER stent post dilated to 3.29 mm the 100% occlusion was reduced to 0%   CORONARY STENT INTERVENTION N/A 11/16/2016   Procedure: Coronary Stent Intervention;  Surgeon: Tonny BollmanMichael Cooper, MD;  [facility];  Service: Cardiovascular;  Laterality: N/A;   LEFT HEART CATH AND CORONARY ANGIOGRAPHY N/A 11/16/2016    Procedure: Left Heart Cath and Coronary Angiography;  Surgeon: Tonny BollmanMichael Cooper, MD;  [facility];  Service: Cardiovascular;  Laterality: N/A;   LEFT HEART CATHETERIZATION WITH CORONARY ANGIOGRAM N/A 07/12/2014   Procedure: LEFT HEART CATHETERIZATION WITH CORONARY ANGIOGRAM;  Surgeon: Micheline ChapmanMichael D Cooper, MD;  [facility];  Service: Cardiovascular;  Laterality: N/A;   TEMPORARY PACEMAKER N/A 11/16/2016   Procedure: Temporary Pacemaker;  Surgeon: Tonny BollmanMichael Cooper, MD;  [facility];  Service: Cardiovascular;  Laterality: N/A;    SOCIAL HISTORY:   Social History   Tobacco Use   Smoking status: Never   Smokeless tobacco: Never  Substance Use Topics   Alcohol use: No    FAMILY HISTORY:   Family History  Problem Relation Age of Onset   Hypertension Mother    Aneurysm Mother        died at age 86   Other Father        unsure of medical history   Hypertension Other     DRUG ALLERGIES:   Allergies  Allergen Reactions   Atorvastatin Nausea And Vomiting   Contrast Media [Iodinated Contrast Media] Other (See Comments)    Nausea/Vomiting and Shortness of Breath   Darvon [Propoxyphene] Nausea And Vomiting   Codeine Nausea And Vomiting and Anxiety    REVIEW OF SYSTEMS:   ROS As per history of present illness. All pertinent systems were reviewed above. Constitutional, HEENT, cardiovascular, respiratory, GI, GU, musculoskeletal, neuro, psychiatric, endocrine, integumentary and hematologic systems were reviewed and are otherwise negative/unremarkable except for positive findings mentioned above in the HPI.   MEDICATIONS AT HOME:   Prior to Admission medications   Medication Sig Start Date End Date Taking? Authorizing Provider  acetaminophen (TYLENOL) 500 MG tablet Take 1,000 mg by mouth every 6 (six) hours as needed for mild pain or moderate pain.   Yes [provider]  amLODipine (NORVASC) 5 MG tablet TAKE 1 TABLET BY MOUTH EVERY DAY  04/05/22  Yes Jonelle SidleMcDowell, Samuel G, MD  aspirin 81 MG tablet Take 81 mg by mouth daily.   Yes [provider]  azelastine (ASTELIN) 0.1 % nasal spray Place 1 spray into both nostrils 2 (two) times daily. Use in each nostril as directed 08/11/22  Yes Particia NearingLane, Rachel Elizabeth, PA-C  azithromycin (ZITHROMAX) 250 MG tablet Take by mouth. 08/15/22  Yes [provider]  benzonatate (TESSALON) 200 MG capsule Take 1 capsule (200 mg total) by mouth 3 (three) times daily as needed for cough. 08/11/22  Yes Particia NearingLane, Rachel Elizabeth, PA-C  carvedilol (COREG) 6.25 MG tablet Take 6.25 mg by mouth 2 (two) times daily with a meal.   Yes [provider]  fluticasone (FLONASE) 50 MCG/ACT nasal spray Place 2 sprays into both nostrils daily. 01/28/21  Yes Wurst, [place], PA-C  furosemide (LASIX) 20 MG tablet Take 20 mg by mouth daily as needed for fluid. 01/05/22  Yes [provider]  guaiFENesin (MUCINEX) 600 MG 12 hr tablet Take 1 tablet (600 mg total) by mouth 2 (two) times daily. 08/11/22  Yes  Particia Nearing, PA-C  hydrALAZINE (APRESOLINE) 50 MG tablet TAKE 1 TABLET BY MOUTH TWICE A DAY 04/30/22  Yes Jonelle Sidle, MD  losartan (COZAAR) 50 MG tablet TAKE 1 TABLET BY MOUTH EVERY DAY 04/05/22  Yes Jonelle Sidle, MD  Menthol, Topical Analgesic, (BIOFREEZE ROLL-ON) 4 % GEL Apply 1 application. topically daily as needed (pain).   Yes [provider]  Menthol, Topical Analgesic, (BIOFREEZE) 4 % GEL Apply 1 patch topically daily as needed (knee pain).   Yes [provider]  nitroGLYCERIN (NITROSTAT) 0.4 MG SL tablet PLACE 1 TABLET UNDER THE TONGUE EVERY 5 MINUTES AS NEEDED. 10/26/21  Yes Jonelle Sidle, MD  pantoprazole (PROTONIX) 40 MG tablet TAKE 1 TABLET BY MOUTH EVERY DAY 04/30/22  Yes Jonelle Sidle, MD  rosuvastatin (CRESTOR) 40 MG tablet TAKE 1 TABLET BY MOUTH EVERY DAY 10/03/21  Yes Jonelle Sidle, MD  Vitamin D, Ergocalciferol, (DRISDOL) 1.25 MG  (50000 UNIT) CAPS capsule Take 50,000 Units by mouth every 30 (thirty) days. Patient not taking: Reported on 08/20/2022 12/08/21   [provider]      VITAL SIGNS:  Blood pressure (!) 157/68, pulse 67, temperature 98.1 F (36.7 C), resp. rate 16, height 5\' 5"  (1.651 m), weight 89.8 kg, SpO2 99 %.  PHYSICAL EXAMINATION:  Physical Exam  GENERAL:  88 y.o.-year-old patient lying in the bed with no acute distress.  EYES: Pupils equal, round, reactive to light and accommodation. No scleral icterus. Extraocular muscles intact.  HEENT: Head atraumatic, normocephalic. Oropharynx and nasopharynx clear.  NECK:  Supple, no jugular venous distention. No thyroid enlargement, no tenderness.  LUNGS: Normal breath sounds bilaterally, no wheezing, rales,rhonchi or crepitation. No use of accessory muscles of respiration.  CARDIOVASCULAR: Regular rate and rhythm, S1, S2 normal. No murmurs, rubs, or gallops.  ABDOMEN: Soft, nondistended, nontender. Bowel sounds present. No organomegaly or mass.  EXTREMITIES: No pedal edema, cyanosis, or clubbing.  NEUROLOGIC: Cranial nerves II through XII are intact except for minimal expressive dysphasia. Muscle strength 5/5 in all extremities. Sensation intact. Gait not checked.  PSYCHIATRIC: The patient is alert and oriented x 3.  Normal affect and good eye contact. SKIN: No obvious rash, lesion, or ulcer.   LABORATORY PANEL:   CBC Recent Labs  Lab 08/20/22 1859  WBC 6.0  HGB 12.3  HCT 38.3  PLT 193   ------------------------------------------------------------------------------------------------------------------  Chemistries  Recent Labs  Lab 08/20/22 1859  NA 139  K 4.1  CL 107  CO2 25  GLUCOSE 130*  BUN 17  CREATININE 1.29*  CALCIUM 8.8*  AST 27  ALT 20  ALKPHOS 56  BILITOT 0.5   ------------------------------------------------------------------------------------------------------------------  Cardiac Enzymes No results for  input(s): "TROPONINI" in the last 168 hours. ------------------------------------------------------------------------------------------------------------------  RADIOLOGY:  MR BRAIN WO CONTRAST  Result Date: 08/20/2022 CLINICAL DATA:  Acute neurologic deficit EXAM: MRI HEAD WITHOUT CONTRAST MRA HEAD WITHOUT CONTRAST TECHNIQUE: Multiplanar, multi-echo pulse sequences of the brain and surrounding structures were acquired without intravenous contrast. Angiographic images of the Circle of Willis were acquired using MRA technique without intravenous contrast. COMPARISON:  Brain MRI 11/26/2021 FINDINGS: MRI HEAD FINDINGS Brain: Multifocal punctate abnormal diffusion restriction in the left hemisphere, within multiple vascular territories. The greatest number are located in the medial left temporal lobe. No large infarct. No acute or chronic hemorrhage. There is multifocal hyperintense T2-weighted signal within the white matter. Generalized volume loss. The midline structures are normal. Vascular: Major flow voids are preserved. Skull and upper cervical spine:  Normal calvarium and skull base. Visualized upper cervical spine and soft tissues are normal. Sinuses/Orbits:No paranasal sinus fluid levels or advanced mucosal thickening. No mastoid or middle ear effusion. Normal orbits. MRA HEAD FINDINGS POSTERIOR CIRCULATION: --Vertebral arteries: Normal --Inferior cerebellar arteries: Normal. --Basilar artery: Normal. --Superior cerebellar arteries: Normal. --Posterior cerebral arteries: Normal. ANTERIOR CIRCULATION: --Intracranial internal carotid arteries: Normal. --Anterior cerebral arteries (ACA): Normal. --Middle cerebral arteries (MCA): Normal. ANATOMIC VARIANTS: Both P comms are patent. IMPRESSION: 1. Multifocal punctate acute ischemia in the left hemisphere, within multiple vascular territories. The greatest number are located in the medial left temporal lobe. Distribution is most consistent with embolic disease  from the left carotid or distal aortic arch. 2. Normal intracranial MRA. Electronically Signed   By: Deatra Robinson M.D.   On: 08/20/2022 19:57   MR ANGIO HEAD WO CONTRAST  Result Date: 08/20/2022 CLINICAL DATA:  Acute neurologic deficit EXAM: MRI HEAD WITHOUT CONTRAST MRA HEAD WITHOUT CONTRAST TECHNIQUE: Multiplanar, multi-echo pulse sequences of the brain and surrounding structures were acquired without intravenous contrast. Angiographic images of the Circle of Willis were acquired using MRA technique without intravenous contrast. COMPARISON:  Brain MRI 11/26/2021 FINDINGS: MRI HEAD FINDINGS Brain: Multifocal punctate abnormal diffusion restriction in the left hemisphere, within multiple vascular territories. The greatest number are located in the medial left temporal lobe. No large infarct. No acute or chronic hemorrhage. There is multifocal hyperintense T2-weighted signal within the white matter. Generalized volume loss. The midline structures are normal. Vascular: Major flow voids are preserved. Skull and upper cervical spine: Normal calvarium and skull base. Visualized upper cervical spine and soft tissues are normal. Sinuses/Orbits:No paranasal sinus fluid levels or advanced mucosal thickening. No mastoid or middle ear effusion. Normal orbits. MRA HEAD FINDINGS POSTERIOR CIRCULATION: --Vertebral arteries: Normal --Inferior cerebellar arteries: Normal. --Basilar artery: Normal. --Superior cerebellar arteries: Normal. --Posterior cerebral arteries: Normal. ANTERIOR CIRCULATION: --Intracranial internal carotid arteries: Normal. --Anterior cerebral arteries (ACA): Normal. --Middle cerebral arteries (MCA): Normal. ANATOMIC VARIANTS: Both P comms are patent. IMPRESSION: 1. Multifocal punctate acute ischemia in the left hemisphere, within multiple vascular territories. The greatest number are located in the medial left temporal lobe. Distribution is most consistent with embolic disease from the left carotid or  distal aortic arch. 2. Normal intracranial MRA. Electronically Signed   By: Deatra Robinson M.D.   On: 08/20/2022 19:57      IMPRESSION AND PLAN:  Assessment and Plan: * Acute CVA (cerebrovascular accident) (HCC) - The patient has multiple left hemispheric punctate ischemic infarcts mainly in the left temporal lobe concerning for embolic disease. - She will be admitted to a medical telemetry bed. - We will follow neurochecks every 4 hours for 24 hours. - ST consult will be obtained as well as PT/OT consults. - Teleneurology consult to be obtained in AM. - We will add Plavix to her aspirin. - We will place her on statin therapy and check fasting lipids. - Will obtain neck CTA and echo with bubble study for further assessment for an embolic source.  GERD without esophagitis - We will continue PPI therapy.  Dyslipidemia - We will continue statin therapy.  Essential hypertension - We will continue her antihypertensives   DVT prophylaxis: Lovenox.  Advanced Care Planning:  Code Status: full code.  Family Communication:  The plan of care was discussed in details with the patient (and family). I answered all questions. The patient agreed to proceed with the above mentioned plan. Further management will depend upon hospital course. Disposition Plan: Back to  previous home environment Consults called: Teleneurology can be called in AM. All the records are reviewed and case discussed with ED provider.  Status is: Inpatient   At the time of the admission, it appears that the appropriate admission status for this patient is inpatient.  This is judged to be reasonable and necessary in order to provide the required intensity of service to ensure the patient's safety given the presenting symptoms, physical exam findings and initial radiographic and laboratory data in the context of comorbid conditions.  The patient requires inpatient status due to high intensity of service, high risk of further  deterioration and high frequency of surveillance required.  I certify that at the time of admission, it is my clinical judgment that the patient will require inpatient hospital care extending more than 2 midnights.                            Dispo: The patient is from: Home              Anticipated d/c is to: Home              Patient currently is not medically stable to d/c.              Difficult to place patient: No  Hannah Beat M.D on 08/21/2022 at 12:30 AM  [facility]   From 7 PM-7 AM, contact night-coverage [email or web address]  CC: Primary care physician; [facility]

## 2022-08-20 NOTE — ED Provider Notes (Signed)
[facility] EMERGENCY DEPARTMENT Provider Note   CSN: [id number] Arrival date & time: 08/20/22  1737     History {Add pertinent medical, surgical, social history, OB history to HPI:1} Chief Complaint  Patient presents with   Aphasia         [name] is a 88 y.o. female.  HPI Patient presents for word finding difficulty.  Onset was 5 days ago.  She lives at home independently but does have daughters who live close by who check on her frequently.  Family noticed it last week.  Her symptoms have persisted.  She endorses a frontal headache over the past 2 days that is improved with Tylenol.  She was recently seen at urgent care and treated for URI.  This was shortly prior to the onset of her word-finding difficulty.  She has a history of a stroke earlier this year without residual deficits.  She denies any areas of weakness, numbness, paresthesias, or ataxia.  She has had intermittent blurriness to her vision.  Daughters noticed this when she was unable to see the numbers on her telephone.  She denies any visual disturbance at this time.    Home Medications Prior to Admission medications   Medication Sig Start Date End Date Taking? Authorizing Provider  acetaminophen (TYLENOL) 500 MG tablet Take 500 mg by mouth every 6 (six) hours as needed for mild pain or moderate pain.    [provider]  amLODipine (NORVASC) 5 MG tablet TAKE 1 TABLET BY MOUTH EVERY DAY 04/05/22   Satira Sark, MD  aspirin 81 MG tablet Take 81 mg by mouth daily.    [provider]  azelastine (ASTELIN) 0.1 % nasal spray Place 1 spray into both nostrils 2 (two) times daily. Use in each nostril as directed 08/11/22   Volney American, PA-C  benzonatate (TESSALON) 200 MG capsule Take 1 capsule (200 mg total) by mouth 3 (three) times daily as needed for cough. 08/11/22   Volney American, PA-C  carvedilol (COREG) 6.25 MG tablet Take 6.25 mg by mouth 2 (two) times daily with a meal.     [provider]  cyclobenzaprine (FLEXERIL) 5 MG tablet Take 1 tablet (5 mg total) by mouth 3 (three) times daily as needed for muscle spasms. Do not drink alcohol or drive while taking this medication.  May cause drowsiness. 02/01/22   Volney American, PA-C  fluticasone [facility]) 50 MCG/ACT nasal spray Place 2 sprays into both nostrils daily. 01/28/21   Wurst, [place], PA-C  furosemide (LASIX) 20 MG tablet Take 20 mg by mouth daily as needed for fluid. 01/05/22   [provider]  gabapentin (NEURONTIN) 100 MG capsule SMARTSIG:1 Capsule(s) By Mouth 1-3 Times Daily Patient not taking: Reported on 05/11/2022 04/09/22   [provider]  guaiFENesin (MUCINEX) 600 MG 12 hr tablet Take 1 tablet (600 mg total) by mouth 2 (two) times daily. 08/11/22   Volney American, PA-C  hydrALAZINE (APRESOLINE) 50 MG tablet TAKE 1 TABLET BY MOUTH TWICE A DAY 04/30/22   Satira Sark, MD  losartan (COZAAR) 50 MG tablet TAKE 1 TABLET BY MOUTH EVERY DAY 04/05/22   Satira Sark, MD  Menthol, Topical Analgesic, (BIOFREEZE ROLL-ON) 4 % GEL Apply 1 application. topically daily as needed (pain).    [provider]  Menthol, Topical Analgesic, (BIOFREEZE) 4 % GEL Apply 1 patch topically daily as needed (knee pain).    [provider]  methocarbamol (ROBAXIN) 500 MG tablet  Take 1 tablet (500 mg total) by mouth every 6 (six) hours as needed for muscle spasms. 03/07/22   Suzan Slick, NP  nitroGLYCERIN (NITROSTAT) 0.4 MG SL tablet PLACE 1 TABLET UNDER THE TONGUE EVERY 5 MINUTES AS NEEDED. 10/26/21   Satira Sark, MD  nystatin cream (MYCOSTATIN) Apply to affected area 2 times daily 02/05/22   Prosperi, Christian H, PA-C  ondansetron (ZOFRAN) 4 MG tablet Take 4 mg by mouth every 8 (eight) hours as needed for vomiting or nausea. 10/26/21   [provider]  oxyCODONE-acetaminophen (PERCOCET/ROXICET) 5-325 MG tablet Take 1 tablet by mouth every 8 (eight) hours as  needed for severe pain.    [provider]  pantoprazole (PROTONIX) 40 MG tablet TAKE 1 TABLET BY MOUTH EVERY DAY 04/30/22   Satira Sark, MD  predniSONE (DELTASONE) 10 MG tablet Take 2 tablets (20 mg total) by mouth daily with breakfast. 02/27/22   Newt Minion, MD  predniSONE (DELTASONE) 20 MG tablet Take 2 tablets (40 mg total) by mouth daily. Do not begin taking until 5/30 02/05/22   Prosperi, Christian H, PA-C  rosuvastatin (CRESTOR) 40 MG tablet TAKE 1 TABLET BY MOUTH EVERY DAY 10/03/21   Satira Sark, MD  Vitamin D, Ergocalciferol, (DRISDOL) 1.25 MG (50000 UNIT) CAPS capsule Take 50,000 Units by mouth every 30 (thirty) days. 12/08/21   [provider]      Allergies    Atorvastatin, Contrast media [iodinated contrast media], Darvon [propoxyphene], and Codeine    Review of Systems   Review of Systems  Eyes:  Positive for visual disturbance.  Neurological:  Positive for speech difficulty and headaches.  All other systems reviewed and are negative.   Physical Exam Updated Vital Signs BP (!) 151/81 (BP Location: Right Arm)   Pulse 67   Temp 98.3 F (36.8 C) (Oral)   Resp 18   Ht 5\' 5"  (1.651 m)   Wt 89.8 kg   SpO2 100%   BMI 32.95 kg/m  Physical Exam Vitals and nursing note reviewed.  Constitutional:      General: She is not in acute distress.    Appearance: Normal appearance. She is well-developed. She is not ill-appearing, toxic-appearing or diaphoretic.  HENT:     Head: Normocephalic and atraumatic.     Right Ear: External ear normal.     Left Ear: External ear normal.     Nose: Nose normal.     Mouth/Throat:     Mouth: Mucous membranes are moist.  Eyes:     General: No visual field deficit.    Extraocular Movements: Extraocular movements intact.     Conjunctiva/sclera: Conjunctivae normal.  Cardiovascular:     Rate and Rhythm: Normal rate and regular rhythm.  Pulmonary:     Effort: Pulmonary effort is normal. No respiratory distress.   Abdominal:     General: There is no distension.     Palpations: Abdomen is soft.  Musculoskeletal:        General: No swelling. Normal range of motion.     Cervical back: Neck supple.  Skin:    General: Skin is warm and dry.     Capillary Refill: Capillary refill takes less than 2 seconds.     Coloration: Skin is not jaundiced or pale.  Neurological:     Mental Status: She is alert and oriented to person, place, and time.     Cranial Nerves: No cranial nerve deficit or facial asymmetry.     Sensory:  Sensation is intact. No sensory deficit.     Motor: No weakness, tremor, abnormal muscle tone or pronator drift.     Coordination: Coordination is intact. Finger-Nose-Finger Test normal.     Comments: Slightly slowed responses consistent with word finding difficulty.  Psychiatric:        Mood and Affect: Mood normal.        Behavior: Behavior normal.     ED Results / Procedures / Treatments   Labs (all labs ordered are listed, but only abnormal results are displayed) Labs Reviewed  COMPREHENSIVE METABOLIC PANEL - Abnormal; Notable for the following components:      Result Value   Glucose, Bld 130 (*)    Creatinine, Ser 1.29 (*)    Calcium 8.8 (*)    Albumin 3.2 (*)    GFR, Estimated 40 (*)    All other components within normal limits  CBG MONITORING, ED - Abnormal; Notable for the following components:   Glucose-Capillary 127 (*)    All other components within normal limits  PROTIME-INR  APTT  CBC  DIFFERENTIAL  ETHANOL  I-STAT CHEM 8, ED    EKG EKG Interpretation  Date/Time:  Monday August 20 2022 18:31:43 EST Ventricular Rate:  72 PR Interval:  192 QRS Duration: 118 QT Interval:  444 QTC Calculation: 486 R Axis:   -62 Text Interpretation: Normal sinus rhythm with sinus arrhythmia Right bundle branch block Left anterior fascicular block *** Bifascicular block *** Abnormal ECG When compared with ECG of 26-Nov-2021 10:54, No significant change was found Confirmed  by Gloris Manchester ([id number]) on 08/20/2022 7:11:54 PM  Radiology No results found.  Procedures Procedures  {Document cardiac monitor, telemetry assessment procedure when appropriate:1}  Medications Ordered in ED Medications  LORazepam (ATIVAN) tablet 1 mg (has no administration in time range)    ED Course/ Medical Decision Making/ A&P                           Medical Decision Making Amount and/or Complexity of Data Reviewed Labs: ordered. Radiology: ordered.  Risk Prescription drug management.   This patient presents to the ED for concern of word finding difficulty, this involves an extensive number of treatment options, and is a complaint that carries with it a high risk of complications and morbidity.  The differential diagnosis includes CVA, new onset dementia, infection, metabolic derangement, polypharmacy, intoxication   Co morbidities that complicate the patient evaluation  HTN, HLD, OSA, CAD, CKD, CVA, GERD   Additional history obtained:  Additional history obtained from patient's daughter External records from outside source obtained and reviewed including EMR   Lab Tests:  I Ordered, and personally interpreted labs.  The pertinent results include: Baseline CKD, normal electrolytes, normal hemoglobin, no leukocytosis   Imaging Studies ordered:  I ordered imaging studies including MRI brain I independently visualized and interpreted imaging which showed multifocal punctate acute areas of ischemia in left hemisphere, predominantly in medial left temporal lobe. I agree with the radiologist interpretation   Cardiac Monitoring: / EKG:  The patient was maintained on a cardiac monitor.  I personally viewed and interpreted the cardiac monitored which showed an underlying rhythm of: Sinus rhythm   Consultations Obtained:  I requested consultation with the ***,  and discussed lab and imaging findings as well as pertinent plan - they recommend: ***   Problem List /  ED Course / Critical interventions / Medication management  Patient is a 75-year-old female presenting for  5 days of word finding difficulty.  In addition, she has had an intermittent headache and visual disturbances.  Currently, she endorses difficulty finding her words only.  She has no other focal neurologic deficits on exam.  Per chart review, patient had CVA in February of this year.  Symptoms at that time were right hemibody numbness.  MRI at that time revealed small left pontine stroke.  She is currently on ASA but no other antiplatelet or blood thinning agents.  Laboratory workup was initiated.  Given concern for a new stroke, MRI brain was ordered.  MRI showed multifocal punctate acute areas of ischemia in left hemisphere, predominantly in medial left temporal lobe.  Neurology was consulted.***. I ordered medication including ***  for ***  Reevaluation of the patient after these medicines showed that the patient {resolved/improved/worsened:23923::"improved"} I have reviewed the patients home medicines and have made adjustments as needed   Social Determinants of Health:  ***   Test / Admission - Considered:  ***   {Document critical care time when appropriate:1} {Document review of labs and clinical decision tools ie heart score, Chads2Vasc2 etc:1}  {Document your independent review of radiology images, and any outside records:1} {Document your discussion with family members, caretakers, and with consultants:1} {Document social determinants of health affecting pt's care:1} {Document your decision making why or why not admission, treatments were needed:1} Final Clinical Impression(s) / ED Diagnoses Final diagnoses:  None    Rx / DC Orders ED Discharge Orders     None

## 2022-08-21 ENCOUNTER — Inpatient Hospital Stay (HOSPITAL_COMMUNITY): Payer: Medicare Other

## 2022-08-21 DIAGNOSIS — N1832 Chronic kidney disease, stage 3b: Secondary | ICD-10-CM | POA: Diagnosis present

## 2022-08-21 DIAGNOSIS — K219 Gastro-esophageal reflux disease without esophagitis: Secondary | ICD-10-CM | POA: Diagnosis not present

## 2022-08-21 DIAGNOSIS — I10 Essential (primary) hypertension: Secondary | ICD-10-CM | POA: Diagnosis not present

## 2022-08-21 DIAGNOSIS — I6389 Other cerebral infarction: Secondary | ICD-10-CM | POA: Diagnosis not present

## 2022-08-21 DIAGNOSIS — I639 Cerebral infarction, unspecified: Secondary | ICD-10-CM | POA: Diagnosis not present

## 2022-08-21 DIAGNOSIS — E669 Obesity, unspecified: Secondary | ICD-10-CM

## 2022-08-21 DIAGNOSIS — E785 Hyperlipidemia, unspecified: Secondary | ICD-10-CM | POA: Diagnosis not present

## 2022-08-21 DIAGNOSIS — N183 Chronic kidney disease, stage 3 unspecified: Secondary | ICD-10-CM | POA: Diagnosis present

## 2022-08-21 LAB — BASIC METABOLIC PANEL
Anion gap: 5 (ref 5–15)
BUN: 14 mg/dL (ref 8–23)
CO2: 22 mmol/L (ref 22–32)
Calcium: 8.8 mg/dL — ABNORMAL LOW (ref 8.9–10.3)
Chloride: 113 mmol/L — ABNORMAL HIGH (ref 98–111)
Creatinine, Ser: 1.03 mg/dL — ABNORMAL HIGH (ref 0.44–1.00)
GFR, Estimated: 53 mL/min — ABNORMAL LOW (ref >60)
Glucose, Bld: 109 mg/dL — ABNORMAL HIGH (ref 70–99)
Potassium: 3.9 mmol/L (ref 3.5–5.1)
Sodium: 140 mmol/L (ref 135–145)

## 2022-08-21 LAB — ECHOCARDIOGRAM COMPLETE BUBBLE STUDY
AR max vel: 2.02 cm2
AV Area VTI: 2.13 cm2
AV Area mean vel: 2.12 cm2
AV Mean grad: 5 mmHg
AV Peak grad: 10.8 mmHg
Ao pk vel: 1.64 m/s
Area-P 1/2: 2.75 cm2
MV VTI: 2.04 cm2
P 1/2 time: 507 msec
S' Lateral: 1.6 cm

## 2022-08-21 LAB — CBC
HCT: 37.1 % (ref 36.0–46.0)
Hemoglobin: 11.9 g/dL — ABNORMAL LOW (ref 12.0–15.0)
MCH: 29.8 pg (ref 26.0–34.0)
MCHC: 32.1 g/dL (ref 30.0–36.0)
MCV: 93 fL (ref 80.0–100.0)
Platelets: 169 10*3/uL (ref 150–400)
RBC: 3.99 MIL/uL (ref 3.87–5.11)
RDW: 14.9 % (ref 11.5–15.5)
WBC: 5.8 10*3/uL (ref 4.0–10.5)
nRBC: 0 % (ref 0.0–0.2)

## 2022-08-21 LAB — HEMOGLOBIN A1C
Hgb A1c MFr Bld: 6 % — ABNORMAL HIGH (ref 4.8–5.6)
Mean Plasma Glucose: 126 mg/dL

## 2022-08-21 LAB — LIPID PANEL
Cholesterol: 103 mg/dL (ref 0–200)
HDL: 41 mg/dL (ref >40)
LDL Cholesterol: 50 mg/dL (ref 0–99)
Total CHOL/HDL Ratio: 2.5 RATIO
Triglycerides: 58 mg/dL (ref <150)
VLDL: 12 mg/dL (ref 0–40)

## 2022-08-21 MED ORDER — ASPIRIN 81 MG PO TABS: 81.0000 mg | ORAL_TABLET | Freq: Every day | ORAL | Status: AC

## 2022-08-21 MED ORDER — CLOPIDOGREL BISULFATE 75 MG PO TABS
75.0000 mg | ORAL_TABLET | Freq: Every day | ORAL | Status: DC
  Administered 2022-08-21: 75 mg via ORAL
  Filled 2022-08-21: qty 1

## 2022-08-21 MED ORDER — CLOPIDOGREL BISULFATE 75 MG PO TABS: 75.0000 mg | ORAL_TABLET | Freq: Every day | ORAL | 3 refills | Status: DC

## 2022-08-21 NOTE — Evaluation (Signed)
Occupational Therapy Evaluation Patient Details Name: [name] MRN: [id number] DOB: 10/15/1933 Today's Date: 08/21/2022   History of Present Illness [name] is a 88 y.o. African-American female with medical history significant for osteoarthritis, CVA, hypertension, dyslipidemia, coronary artery disease, GERD, OSA and obesity, who presented to the emergency room with acute onset of expressive dysphasia that started about 5 days ago.  The patient lives at home alone and has been independent.  Her daughters live close to her and frequently check on her.  Her family noticed that last week she was having difficulty finding words and her symptoms persisted.  She has been having frontal headache over the last couple of days for which she has been taking Tylenol with improvement.  She was recently seen in urgent care and treated for URI.  This was before her symptoms started.  She had a CVA earlier this year without residual deficits.  No unilateral muscle weakness or paresthesias.  No gait abnormalities.  No visual loss, blurred vision or diplopia, tinnitus or vertigo.  No fever or chills.  No chest pain or palpitations.  No cough or wheezing.  No nausea or vomiting or abdominal pain.  No dysuria, oliguria or hematuria or flank pain. (per MD)   Clinical Impression   Pt agreeable to OT and PT co-evaluation. Pt appears to be operating near baseline levels for functional mobility and ADL's. Pt has R UE deficits at baseline limiting shoulder mobility and R UE strength. Pt was able to transfer to complete lower body dressing with mod I to supervision level of assist. Pt was left in bed with family present. Pt is not recommended for further acute OT services and will be discharged to care of nursing staff for remaining length of stay.       Recommendations for follow up therapy are one component of a multi-disciplinary discharge planning process, led by the attending physician.  Recommendations may be  updated based on patient status, additional functional criteria and insurance authorization.   Follow Up Recommendations  No OT follow up     Assistance Recommended at Discharge PRN  Patient can return home with the following Help with stairs or ramp for entrance    Functional Status Assessment  Patient has not had a recent decline in their functional status  Equipment Recommendations  None recommended by OT    Recommendations for Other Services       Precautions / Restrictions Precautions Precautions: None Restrictions Weight Bearing Restrictions: No      Mobility Bed Mobility Overal bed mobility: Needs Assistance Bed Mobility: Supine to Sit, Sit to Supine     Supine to sit: Min assist Sit to supine: Modified independent (Device/Increase time)   General bed mobility comments: Single hand held assist to pull to sit. Labored movement.    Transfers Overall transfer level: Needs assistance Equipment used: Rolling walker (2 wheels) Transfers: Sit to/from Stand, Bed to chair/wheelchair/BSC Sit to Stand: Supervision     Step pivot transfers: Supervision     General transfer comment: Able to transfer to chair without AD with supervision. For ambulatory transfers pt was at level of supervision assist today with RW.      Balance Overall balance assessment: Needs assistance Sitting-balance support: No upper extremity supported, Feet supported Sitting balance-Leahy Scale: Good Sitting balance - Comments: seated EOB   Standing balance support: During functional activity, Bilateral upper extremity supported Standing balance-Leahy Scale: Fair Standing balance comment: using RW  ADL either performed or assessed with clinical judgement   ADL Overall ADL's : Needs assistance/impaired     Grooming: Modified independent;Supervision/safety;Sitting   Upper Body Bathing: Modified independent;Supervision/ safety   Lower Body Bathing:  Modified independent;Supervison/ safety;Sitting/lateral leans   Upper Body Dressing : Modified independent;Sitting   Lower Body Dressing: Supervision/safety;Sitting/lateral leans Lower Body Dressing Details (indicate cue type and reason): Able to don socks seated in chair. Toilet Transfer: Modified Independent;Supervision/safety;Stand-pivot Statistician Details (indicate cue type and reason): Simulated via EOB to chair and back without AD.         Functional mobility during ADLs: Supervision/safety;Rolling walker (2 wheels)       Vision Baseline Vision/History: 1 Wears glasses Ability to See in Adequate Light: 0 Adequate Patient Visual Report: No change from baseline Vision Assessment?: No apparent visual deficits Additional Comments: Mild difficulty with vertical tracking at midline but not significant.                Pertinent Vitals/Pain Pain Assessment Pain Assessment: Faces Faces Pain Scale: No hurt     Hand Dominance Right   Extremity/Trunk Assessment Upper Extremity Assessment Upper Extremity Assessment: RUE deficits/detail;LUE deficits/detail RUE Deficits / Details: Limited to 2+/5 for shoulder flexion. 4-/5 for elbow flexion, 3+/5 elbow extension; 4-/5 grip. RUE Coordination: WNL LUE Deficits / Details: Generally weak.   Lower Extremity Assessment Lower Extremity Assessment: Defer to PT evaluation   Cervical / Trunk Assessment Cervical / Trunk Assessment: Normal   Communication Communication Communication: No difficulties   Cognition Arousal/Alertness: Awake/alert Behavior During Therapy: WFL for tasks assessed/performed Overall Cognitive Status: Within Functional Limits for tasks assessed                                                        Home Living Family/patient expects to be discharged to:: Private residence Living Arrangements: Children Available Help at Discharge: Family;Friend(s);Available 24 hours/day Type of  Home: House Home Access: Stairs to enter [employer] of Steps: 6 Entrance Stairs-Rails: Can reach both Home Layout: One level     Bathroom Shower/Tub: Producer, television/film/video: Standard Bathroom Accessibility: Yes   Home Equipment: Rollator (4 wheels);Cane - single point;Shower seat;Grab bars - tub/shower;Rolling Walker (2 wheels)   Additional Comments: Pt's family reported no change in previous living history.      Prior Functioning/Environment Prior Level of Function : Needs assist       Physical Assist : ADLs (physical)   ADLs (physical): Bathing Mobility Comments: Pt uses RW and Rollator within the house since June. ADLs Comments: reports family assists with IADLs, she does not drive; family reported that they assist with preparing the bath for pt.                      OT Goals(Current goals can be found in the care plan section) Acute Rehab OT Goals Patient Stated Goal: return home  OT Frequency:      Co-evaluation PT/OT/SLP Co-Evaluation/Treatment: Yes Reason for Co-Treatment: To address functional/ADL transfers   OT goals addressed during session: ADL's and self-care                       End of Session Equipment Utilized During Treatment: Rolling walker (2 wheels);Gait belt  Activity Tolerance: Patient tolerated treatment well  Patient left: in bed;with call bell/phone within reach;with family/visitor present  OT Visit Diagnosis: Muscle weakness (generalized) (M62.81);Other symptoms and signs involving the nervous system (R29.898);Unsteadiness on feet (R26.81)                Time: 9935-7017 OT Time Calculation (min): 21 min Charges:  OT General Charges $OT Visit: 1 Visit OT Evaluation $OT Eval Low Complexity: 1 Low  Kaylene Dawn OT, MOT  Danie Chandler 08/21/2022, 9:50 AM

## 2022-08-21 NOTE — Assessment & Plan Note (Addendum)
-  Continue treatment with Crestor and Zetia -Close monitoring to LDL and LFTs; goal is for LDL less than 70. -Heart healthy diet discussed with patient.

## 2022-08-21 NOTE — Plan of Care (Signed)
  Problem: Acute Rehab PT Goals(only PT should resolve) Goal: Pt Will Go Supine/Side To Sit Outcome: Progressing Flowsheets (Taken 08/21/2022 1206) Pt will go Supine/Side to Sit: with modified independence Goal: Patient Will Transfer Sit To/From Stand Outcome: Progressing Flowsheets (Taken 08/21/2022 1206) Patient will transfer sit to/from stand:  with modified independence  with supervision Goal: Pt Will Transfer Bed To Chair/Chair To Bed Outcome: Progressing Flowsheets (Taken 08/21/2022 1206) Pt will Transfer Bed to Chair/Chair to Bed:  with modified independence  with supervision Goal: Pt Will Ambulate Outcome: Progressing Flowsheets (Taken 08/21/2022 1206) Pt will Ambulate:  > 125 feet  with modified independence  with supervision  with rolling walker   12:06 PM, 08/21/22 Ocie Bob, MPT Physical Therapist with [facility] [phone] office [phone] mobile phone

## 2022-08-21 NOTE — Progress Notes (Signed)
*  PRELIMINARY RESULTS* Echocardiogram 2D Echocardiogram has been performed.  [name] 08/21/2022, 1:06 PM

## 2022-08-21 NOTE — Consult Note (Signed)
[facility] Telemedicine Consult   Requesting Provider: Lestine Box Consult Participants: PAtient, daughter, nurse Location of the provider: [facility] Location of the patient: [name], [place]  This consult was provided via telemedicine with 2-way video and audio communication. The patient/family was informed that care would be provided in this way and agreed to receive care in this manner.    CC: Aphasia  History is obtained from:Chart review  HPI: [name] is a 88 y.o. female with a history of htn, hyperlipidemia, cva, who presents with speech difficulty that started 5 days ago.  She states that she has been having trouble with getting the names of people correct, it is decided to seek care in the emergency department today.   LKW: 6 days ago tpa given?: No, out of windowExam: Vitals:   08/21/22 0800 08/21/22 0932  BP: (!) 192/75 (!) 173/70  Pulse: 75 69  Resp: 19 17  Temp:  97.9 F (36.6 C)  SpO2: 100%     General: in bed,   1A: Level of Consciousness - 0 1B: Ask Month and Age - 1(gives age as 53) 1C: 'Blink Eyes' & 'Squeeze Hands' - 0 2: Test Horizontal Extraocular Movements - 0 3: Test Visual Fields - 0 4: Test Facial Palsy - 0 5A: Test Left Arm Motor Drift - 0 5B: Test Right Arm Motor Drift - 0 6A: Test Left Leg Motor Drift - 0 6B: Test Right Leg Motor Drift - 1 7: Test Limb Ataxia - 0 8: Test Sensation - 0 9: Test Language/Aphasia- 1 10: Test Dysarthria - 0 11: Test Extinction/Inattention - 0 NIHSS score: 3   Imaging Reviewed: MRI [facility] - multifocal embolic appearing strokes  Labs reviewed in epic and pertinent values follow: LDL 50     Assessment: 88 yo F with embolic appearing ischemic infarcts left hemisphere.  Given they are all on one side, my suspicion is that she had a single embolus that broke up, since there is no evidence of a proximal stenosis that would explain unilateral emboli.  We could consider TEE, but in this  situation I think it is unlikely to change management.  I would favor getting a implanted loop recorder so that if she were to have another embolic appearing infarct we could check and see if she has had any episodes of atrial fibrillation.  Recommendations:  1) aspirin and Plavix x 3 weeks followed by Plavix monotherapy 2) continue Crestor 40 mg nightly, goal LDL less than 70 3) blood pressure control with outpatient, with goal of normotension 4) discussed with cardiology for implanted loop recorder  5) patient follow-up with a neurologist   Ritta Slot, MD [facility] [phone]  If 7pm- 7am, please page neurology on call as listed in AMION.

## 2022-08-21 NOTE — Assessment & Plan Note (Addendum)
-  Resume home antihypertensive agent -Heart healthy diet discussed with patient.

## 2022-08-21 NOTE — Assessment & Plan Note (Signed)
-  Stage IIIb baseline GFR -Continue to maintain adequate hydration, minimize as much as possible nephrotoxic agents and follow renal function trend -Patient advised to maintain adequate hydration -Repeat basic metabolic panel at follow-up visit to assess renal function trend.

## 2022-08-21 NOTE — Assessment & Plan Note (Signed)
-  Body mass index is 32.95 kg/m. -Low-calorie diet and portion control discussed with patient.

## 2022-08-21 NOTE — Assessment & Plan Note (Addendum)
-   The patient has multiple left hemispheric punctate ischemic infarcts mainly in the left temporal lobe concerning for embolic disease. -Case discussed with neurology service who recommended dual antiplatelet therapy for 3 weeks with transition to monotherapy using Plavix. -Continue Crestor for LDL goal less than 70 -Continue risk factor modifications including blood pressure control towards normotension and loop recorder placement by cardiology as an outpatient. -Physical therapy has assessed patient with recommendations for outpatient PT.

## 2022-08-21 NOTE — Assessment & Plan Note (Addendum)
Continue PPI ?

## 2022-08-21 NOTE — Progress Notes (Signed)
Patient stable and ready to discharge home on Plavix. [name] at bedside. Patient will f/u with outpatient rehab at discharge. Patient's IV removed. Writer went over discharge paperwork with patient and daughter and both verbalized understanding. Patient dressed herself. NT transported patient to daughter's car via [facility] for discharge.

## 2022-08-21 NOTE — Discharge Summary (Signed)
Physician Discharge Summary   Patient: [name] MRN: [id number] DOB: [birth year]  Admit date:     08/20/2022  Discharge date: 08/21/22  Discharge Physician: Vassie Loll   PCP: [name]   Recommendations at discharge:  Repeat basic metabolic panel to follow electrolytes and renal function Repeat CBC to follow hemoglobin trend/stability Make sure patient follow-up with neurology as instructed Reassess blood pressure and adjust antihypertensive regimen as needed Continue close monitoring of patient's lipid panel/LFTs with further adjustment to lower lipid agents.  discharge Diagnoses: Principal Problem:   Acute CVA (cerebrovascular accident) [facility]) Active Problems:   Essential hypertension   Dyslipidemia   GERD without esophagitis    Hospital Course: As per H&P written by Dr. Arville Care on 08/20/2022 [name] is a 88 y.o. African-American female with medical history significant for osteoarthritis, CVA, hypertension, dyslipidemia, coronary artery disease, GERD, OSA and obesity, who presented to the emergency room with acute onset of expressive dysphasia that started about 5 days ago.  The patient lives at home alone and has been independent.  Her daughters live close to her and frequently check on her.  Her family noticed that last week she was having difficulty finding words and her symptoms persisted.  She has been having frontal headache over the last couple of days for which she has been taking Tylenol with improvement.  She was recently seen in urgent care and treated for URI.  This was before her symptoms started.  She had a CVA earlier this year without residual deficits.  No unilateral muscle weakness or paresthesias.  No gait abnormalities.  No visual loss, blurred vision or diplopia, tinnitus or vertigo.  No fever or chills.  No chest pain or palpitations.  No cough or wheezing.  No nausea or vomiting or abdominal pain.  No dysuria, oliguria or hematuria or  flank pain.   ED Course: When she came to the ER BP was 151/81 with otherwise normal vital signs.  Labs revealed a creatinine 1.29 compared to 1.4 on 11/26/2021.  Albumin 3.2 and glucose 130 with otherwise unremarkable CMP.  CBC was within normal and coagulation profile was within normal. EKG as reviewed by me : EKG showed normal sinus rhythm with a rate of 72 with sinus arrhythmia with right bundle branch block and left intrafascicular block (bifascicular block) Imaging: Noncontrasted CT scan revealed chronic small vessel ischemic changes with no acute intra abnormalities. Brain MRI with MRA of the head without contrast revealed the following: 1. Multifocal punctate acute ischemia in the left hemisphere, within multiple vascular territories. The greatest number are located in the medial left temporal lobe. Distribution is most consistent with embolic disease from the left carotid or distal aortic arch. 2. Normal intracranial MRA.   Dr. Wilford Corner was contacted about the patient and recommended admission here.  Teleneurology consult will be obtained in a.m.  The patient will be admitted to a medical telemetry bed for further evaluation and management.  Assessment and Plan: * Acute CVA (cerebrovascular accident) (HCC) - The patient has multiple left hemispheric punctate ischemic infarcts mainly in the left temporal lobe concerning for embolic disease. -Case discussed with neurology service who recommended dual antiplatelet therapy for 3 weeks with transition to monotherapy using Plavix. -Continue Crestor for LDL goal less than 70 -Continue risk factor modifications including blood pressure control towards normotension and loop recorder placement by cardiology as an outpatient. -Physical therapy has assessed patient with recommendations for outpatient PT.  Chronic kidney disease, stage III (moderate) (  HCC) -Stage IIIb baseline GFR -Continue to maintain adequate hydration, minimize as much as possible  nephrotoxic agents and follow renal function trend -Patient advised to maintain adequate hydration -Repeat basic metabolic panel at follow-up visit to assess renal function trend.  GERD without esophagitis -Continue PPI.  Dyslipidemia -Continue treatment with Crestor and Zetia -Close monitoring to LDL and LFTs; goal is for LDL less than 70. -Heart healthy diet discussed with patient.  Class 1 obesity -Body mass index is 32.95 kg/m. -Low-calorie diet and portion control discussed with patient.  Essential hypertension -Resume home antihypertensive agent -Heart healthy diet discussed with patient.   Consultants: GI service Procedures performed: See below for x-ray report; 2D echo. Disposition: Home with referral for outpatient physical therapy. Diet recommendation:  Discharge Diet Orders (From admission, onward)     Start     Ordered   08/21/22 0000  Diet - low sodium heart healthy        08/21/22 1621            DISCHARGE MEDICATION: Allergies as of 08/21/2022       Reactions   Atorvastatin Nausea And Vomiting   Contrast Media [iodinated Contrast Media] Other (See Comments)   Nausea/Vomiting and Shortness of Breath   Darvon [propoxyphene] Nausea And Vomiting   Codeine Nausea And Vomiting, Anxiety        Medication List     STOP taking these medications    azithromycin 250 MG tablet Commonly known as: ZITHROMAX       TAKE these medications    acetaminophen 500 MG tablet Commonly known as: TYLENOL Take 1,000 mg by mouth every 6 (six) hours as needed for mild pain or moderate pain.   amLODipine 5 MG tablet Commonly known as: NORVASC TAKE 1 TABLET BY MOUTH EVERY DAY   aspirin 81 MG tablet Take 1 tablet (81 mg total) by mouth daily for 21 days.   azelastine 0.1 % nasal spray Commonly known as: ASTELIN Place 1 spray into both nostrils 2 (two) times daily. Use in each nostril as directed   benzonatate 200 MG capsule Commonly known as:  TESSALON Take 1 capsule (200 mg total) by mouth 3 (three) times daily as needed for cough.   Biofreeze 4 % Gel Generic drug: Menthol (Topical Analgesic) Apply 1 patch topically daily as needed (knee pain).   Biofreeze Roll-On 4 % Gel Generic drug: Menthol (Topical Analgesic) Apply 1 application. topically daily as needed (pain).   carvedilol 6.25 MG tablet Commonly known as: COREG Take 6.25 mg by mouth 2 (two) times daily with a meal.   clopidogrel 75 MG tablet Commonly known as: PLAVIX Take 1 tablet (75 mg total) by mouth daily. Start taking on: August 22, 2022   fluticasone 50 MCG/ACT nasal spray Commonly known as: FLONASE Place 2 sprays into both nostrils daily.   furosemide 20 MG tablet Commonly known as: LASIX Take 20 mg by mouth daily as needed for fluid.   guaiFENesin 600 MG 12 hr tablet Commonly known as: Mucinex Take 1 tablet (600 mg total) by mouth 2 (two) times daily.   hydrALAZINE 50 MG tablet Commonly known as: APRESOLINE TAKE 1 TABLET BY MOUTH TWICE A DAY   losartan 50 MG tablet Commonly known as: COZAAR TAKE 1 TABLET BY MOUTH EVERY DAY   nitroGLYCERIN 0.4 MG SL tablet Commonly known as: NITROSTAT PLACE 1 TABLET UNDER THE TONGUE EVERY 5 MINUTES AS NEEDED.   pantoprazole 40 MG tablet Commonly known as: PROTONIX TAKE 1 TABLET  BY MOUTH EVERY DAY   rosuvastatin 40 MG tablet Commonly known as: CRESTOR TAKE 1 TABLET BY MOUTH EVERY DAY   Vitamin D (Ergocalciferol) 1.25 MG (50000 UNIT) Caps capsule Commonly known as: DRISDOL Take 50,000 Units by mouth every 30 (thirty) days.        Follow-up Information     [facility] Follow up.   Specialty: Rehabilitation Contact information: [address] [id number] [address] [place] [id number] [phone]        Group, [facility]. Schedule an appointment as soon as possible for a visit in 10 day(s).   Contact information: [address] [place] [id number] [phone]                Discharge Exam: Filed Weights   08/20/22 1833  Weight: 89.8 kg   General exam: Alert, awake, oriented x 3; following commands appropriately; in no acute distress. Respiratory system: Clear to auscultation. Respiratory effort normal.  Good saturation on room air. Cardiovascular system:RRR. No rubs or gallops. Gastrointestinal system: Abdomen is obese, nondistended, soft and nontender. No organomegaly or masses felt. Normal bowel sounds heard. Central nervous system: Alert and oriented. NIHSS Score 3; significant residual deficits appreciated. Extremities: No cyanosis or clubbing. Skin: No petechiae. Psychiatry: Judgement and insight appear normal. Mood & affect appropriate.    Condition at discharge: Stable and improved.  The results of significant diagnostics from this hospitalization (including imaging, microbiology, ancillary and laboratory) are listed below for reference.   Imaging Studies: ECHOCARDIOGRAM COMPLETE BUBBLE STUDY  Result Date: 08/21/2022    ECHOCARDIOGRAM REPORT   Patient Name:   Damonie Estill BambergM Dhaliwal Date of Exam: 08/21/2022 Medical Rec #:  [id number]      Height:       65.0 in Accession #:    [id number]     Weight:       198.0 lb Date of Birth:  11/22/1933      BSA:          1.970 m Patient Age:    88 years       BP:           192/75 mmHg Patient Gender: F              HR:           65 bpm. [facility] Procedure: 2D Echo, Cardiac Doppler, Color Doppler and Saline Contrast Bubble            Study Indications:    Stroke  History:        Patient has prior history of Echocardiogram examinations, most                 recent 09/18/2018. CAD and Previous Myocardial Infarction, Stroke,                 Arrythmias:RBBB, Signs/Symptoms:Shortness of Breath; Risk                 Factors:Hypertension and Dyslipidemia.  Sonographer:    Mikki Harbororothy Buchanan Referring Phys: [id number] JAN A MANSY IMPRESSIONS  1. Left ventricular  ejection fraction, by estimation, is 70 to 75%. The left ventricle has hyperdynamic function. The left ventricle has no regional wall motion abnormalities. There is severe concentric left ventricular hypertrophy. Left ventricular diastolic parameters are consistent with Grade I diastolic dysfunction (impaired relaxation).  2. Right ventricular systolic function is normal. The right ventricular size is normal. There is normal pulmonary artery  systolic pressure. The estimated right ventricular systolic pressure is 24.0 mmHg.  3. Left atrial size was mildly dilated.  4. The mitral valve is grossly normal. Mild mitral valve regurgitation.  5. The aortic valve is tricuspid. Aortic valve regurgitation is mild. Aortic valve sclerosis is present, with no evidence of aortic valve stenosis. Aortic valve mean gradient measures 5.0 mmHg.  6. Agitated saline contrast bubble study was negative, with no evidence of right to left interatrial shunt. Comparison(s): No significant change from prior study. Prior images reviewed side by side. FINDINGS  Left Ventricle: Left ventricular ejection fraction, by estimation, is 70 to 75%. The left ventricle has hyperdynamic function. The left ventricle has no regional wall motion abnormalities. The left ventricular internal cavity size was normal in size. There is severe concentric left ventricular hypertrophy. Left ventricular diastolic parameters are consistent with Grade I diastolic dysfunction (impaired relaxation). Right Ventricle: The right ventricular size is normal. No increase in right ventricular wall thickness. Right ventricular systolic function is normal. There is normal pulmonary artery systolic pressure. The tricuspid regurgitant velocity is 2.29 m/s, and  with an assumed right atrial pressure of 3 mmHg, the estimated right ventricular systolic pressure is 24.0 mmHg. Left Atrium: Left atrial size was mildly dilated. Right Atrium: Right atrial size was normal in size.  Pericardium: There is no evidence of pericardial effusion. Presence of epicardial fat layer. Mitral Valve: The mitral valve is grossly normal. There is mild calcification of the mitral valve leaflet(s). Mild mitral valve regurgitation. MV peak gradient, 9.0 mmHg. The mean mitral valve gradient is 3.0 mmHg. Tricuspid Valve: The tricuspid valve is grossly normal. Tricuspid valve regurgitation is trivial. Aortic Valve: The aortic valve is tricuspid. There is mild aortic valve annular calcification. Aortic valve regurgitation is mild. Aortic regurgitation PHT measures 507 msec. Aortic valve sclerosis is present, with no evidence of aortic valve stenosis. Aortic valve mean gradient measures 5.0 mmHg. Aortic valve peak gradient measures 10.8 mmHg. Aortic valve area, by VTI measures 2.13 cm. Pulmonic Valve: The pulmonic valve was grossly normal. Pulmonic valve regurgitation is mild. Aorta: The aortic root is normal in size and structure. IAS/Shunts: No atrial level shunt detected by color flow Doppler. Agitated saline contrast was given intravenously to evaluate for intracardiac shunting. Agitated saline contrast bubble study was negative, with no evidence of any interatrial shunt.  LEFT VENTRICLE PLAX 2D LVIDd:         4.20 cm   Diastology LVIDs:         1.60 cm   LV e' medial:    4.35 cm/s LV PW:         1.80 cm   LV E/e' medial:  21.9 LV IVS:        1.70 cm   LV e' lateral:   6.53 cm/s LVOT diam:     1.90 cm   LV E/e' lateral: 14.6 LV SV:         92 LV SV Index:   46 LVOT Area:     2.84 cm  RIGHT VENTRICLE RV Basal diam:  3.30 cm RV Mid diam:    2.80 cm RV S prime:     16.90 cm/s TAPSE (M-mode): 2.7 cm LEFT ATRIUM             Index        RIGHT ATRIUM           Index LA diam:        3.90 cm 1.98 cm/m  RA Area:     16.20 cm LA Vol (A2C):   84.6 ml 42.95 ml/m  RA Volume:   41.50 ml  21.07 ml/m LA Vol (A4C):   45.7 ml 23.20 ml/m LA Biplane Vol: 64.3 ml 32.64 ml/m  AORTIC VALVE                     PULMONIC VALVE AV  Area (Vmax):    2.02 cm      PV Vmax:       1.24 m/s AV Area (Vmean):   2.12 cm      PV Peak grad:  6.2 mmHg AV Area (VTI):     2.13 cm AV Vmax:           164.00 cm/s AV Vmean:          108.000 cm/s AV VTI:            0.430 m AV Peak Grad:      10.8 mmHg AV Mean Grad:      5.0 mmHg LVOT Vmax:         117.00 cm/s LVOT Vmean:        80.800 cm/s LVOT VTI:          0.323 m LVOT/AV VTI ratio: 0.75 AI PHT:            507 msec  AORTA Ao Root diam: 3.30 cm Ao Asc diam:  3.30 cm MITRAL VALVE                TRICUSPID VALVE MV Area (PHT): 2.75 cm     TR Peak grad:   21.0 mmHg MV Area VTI:   2.04 cm     TR Vmax:        229.00 cm/s MV Peak grad:  9.0 mmHg MV Mean grad:  3.0 mmHg     SHUNTS MV Vmax:       1.50 m/s     Systemic VTI:  0.32 m MV Vmean:      73.2 cm/s    Systemic Diam: 1.90 cm MV Decel Time: 276 msec MV E velocity: 95.10 cm/s MV A velocity: 134.00 cm/s MV E/A ratio:  0.71 Nona Dell MD Electronically signed by Nona Dell MD Signature Date/Time: 08/21/2022/1:24:26 PM    Final    US Carotid Bilateral  Result Date: 08/21/2022 CLINICAL DATA:  88 year old female with history of stroke. EXAM: BILATERAL CAROTID DUPLEX ULTRASOUND TECHNIQUE: Wallace Cullens scale imaging, color Doppler and duplex ultrasound were performed of bilateral carotid and vertebral arteries in the neck. COMPARISON:  None Available. FINDINGS: Criteria: Quantification of carotid stenosis is based on velocity parameters that correlate the residual internal carotid diameter with NASCET-based stenosis levels, using the diameter of the distal internal carotid lumen as the denominator for stenosis measurement. The following velocity measurements were obtained: RIGHT ICA: Peak systolic velocity 79 cm/sec, End diastolic velocity 21 cm/sec CCA: Peak systolic velocity 83 cm/sec SYSTOLIC ICA/CCA RATIO:  1.0 ECA: Peak systolic velocity 135 cm/sec LEFT ICA: Peak systolic velocity 66 cm/sec, End diastolic velocity 18 cm/sec CCA: 86 cm/sec SYSTOLIC ICA/CCA  RATIO:  0.8 ECA: 81 cm/sec RIGHT CAROTID ARTERY: Minimal scattered atherosclerotic plaque formation. No significant tortuosity. Normal low resistance waveforms. RIGHT VERTEBRAL ARTERY:  Antegrade flow. LEFT CAROTID ARTERY: Minimal scattered atherosclerotic plaque formation. Tortuosity of the common carotid artery is noted. Normal low resistance waveforms. LEFT VERTEBRAL ARTERY:  Antegrade flow. Upper extremity non-invasive blood pressures: Not obtained. IMPRESSION: 1. Right carotid artery system: Patent  without significant atherosclerotic plaque formation. 2. Left carotid artery system: Patent without significant atherosclerotic plaque formation. 3.  Vertebral artery system: Patent with antegrade flow bilaterally. Marliss Coots, MD [facility]iology Electronically Signed   By: Marliss Coots M.D.   On: 08/21/2022 09:20   MR BRAIN WO CONTRAST  Result Date: 08/20/2022 CLINICAL DATA:  Acute neurologic deficit EXAM: MRI HEAD WITHOUT CONTRAST MRA HEAD WITHOUT CONTRAST TECHNIQUE: Multiplanar, multi-echo pulse sequences of the brain and surrounding structures were acquired without intravenous contrast. Angiographic images of the Circle of Willis were acquired using MRA technique without intravenous contrast. COMPARISON:  Brain MRI 11/26/2021 FINDINGS: MRI HEAD FINDINGS Brain: Multifocal punctate abnormal diffusion restriction in the left hemisphere, within multiple vascular territories. The greatest number are located in the medial left temporal lobe. No large infarct. No acute or chronic hemorrhage. There is multifocal hyperintense T2-weighted signal within the white matter. Generalized volume loss. The midline structures are normal. Vascular: Major flow voids are preserved. Skull and upper cervical spine: Normal calvarium and skull base. Visualized upper cervical spine and soft tissues are normal. Sinuses/Orbits:No paranasal sinus fluid levels or advanced mucosal  thickening. No mastoid or middle ear effusion. Normal orbits. MRA HEAD FINDINGS POSTERIOR CIRCULATION: --Vertebral arteries: Normal --Inferior cerebellar arteries: Normal. --Basilar artery: Normal. --Superior cerebellar arteries: Normal. --Posterior cerebral arteries: Normal. ANTERIOR CIRCULATION: --Intracranial internal carotid arteries: Normal. --Anterior cerebral arteries (ACA): Normal. --Middle cerebral arteries (MCA): Normal. ANATOMIC VARIANTS: Both P comms are patent. IMPRESSION: 1. Multifocal punctate acute ischemia in the left hemisphere, within multiple vascular territories. The greatest number are located in the medial left temporal lobe. Distribution is most consistent with embolic disease from the left carotid or distal aortic arch. 2. Normal intracranial MRA. Electronically Signed   By: Deatra Robinson M.D.   On: 08/20/2022 19:57   MR ANGIO HEAD WO CONTRAST  Result Date: 08/20/2022 CLINICAL DATA:  Acute neurologic deficit EXAM: MRI HEAD WITHOUT CONTRAST MRA HEAD WITHOUT CONTRAST TECHNIQUE: Multiplanar, multi-echo pulse sequences of the brain and surrounding structures were acquired without intravenous contrast. Angiographic images of the Circle of Willis were acquired using MRA technique without intravenous contrast. COMPARISON:  Brain MRI 11/26/2021 FINDINGS: MRI HEAD FINDINGS Brain: Multifocal punctate abnormal diffusion restriction in the left hemisphere, within multiple vascular territories. The greatest number are located in the medial left temporal lobe. No large infarct. No acute or chronic hemorrhage. There is multifocal hyperintense T2-weighted signal within the white matter. Generalized volume loss. The midline structures are normal. Vascular: Major flow voids are preserved. Skull and upper cervical spine: Normal calvarium and skull base. Visualized upper cervical spine and soft tissues are normal. Sinuses/Orbits:No paranasal sinus fluid levels or advanced mucosal thickening. No mastoid or  middle ear effusion. Normal orbits. MRA HEAD FINDINGS POSTERIOR CIRCULATION: --Vertebral arteries: Normal --Inferior cerebellar arteries: Normal. --Basilar artery: Normal. --Superior cerebellar arteries: Normal. --Posterior cerebral arteries: Normal. ANTERIOR CIRCULATION: --Intracranial internal carotid arteries: Normal. --Anterior cerebral arteries (ACA): Normal. --Middle cerebral arteries (MCA): Normal. ANATOMIC VARIANTS: Both P comms are patent. IMPRESSION: 1. Multifocal punctate acute ischemia in the left hemisphere, within multiple vascular territories. The greatest number are located in the medial left temporal lobe. Distribution is most consistent with embolic disease from the left carotid or distal aortic arch. 2. Normal intracranial MRA. Electronically Signed   By: Deatra Robinson M.D.   On: 08/20/2022 19:57    Microbiology: Results for orders placed or performed during the hospital encounter of 08/20/22  Resp panel by RT-PCR (  RSV, Flu A&B, Covid) Anterior Nasal Swab     Status: None   Collection Time: 08/20/22  8:15 PM   Specimen: Anterior Nasal Swab  Result Value Ref Range Status   SARS Coronavirus 2 by RT PCR NEGATIVE NEGATIVE Final    Comment: (NOTE) SARS-CoV-2 target nucleic acids are NOT DETECTED.  The SARS-CoV-2 RNA is generally detectable in upper respiratory specimens during the acute phase of infection. The lowest concentration of SARS-CoV-2 viral copies this assay can detect is 138 copies/mL. A negative result does not preclude SARS-Cov-2 infection and should not be used as the sole basis for treatment or other patient management decisions. A negative result may occur with  improper specimen collection/handling, submission of specimen other than nasopharyngeal swab, presence of viral mutation(s) within the areas targeted by this assay, and inadequate number of viral copies(<138 copies/mL). A negative result must be combined with clinical observations, patient history, and  epidemiological information. The expected result is Negative.  Fact Sheet for Patients:  BloggerCourse.com  Fact Sheet for Healthcare Providers:  SeriousBroker.it  This test is no t yet approved or cleared by the [place] FDA and  has been authorized for detection and/or diagnosis of SARS-CoV-2 by FDA under an Emergency Use Authorization (EUA). This EUA will remain  in effect (meaning this test can be used) for the duration of the COVID-19 declaration under Section 564(b)(1) of the Act, 21 U.S.C.section 360bbb-3(b)(1), unless the authorization is terminated  or revoked sooner.       Influenza A by PCR NEGATIVE NEGATIVE Final   Influenza B by PCR NEGATIVE NEGATIVE Final    Comment: (NOTE) The Xpert Xpress SARS-CoV-2/FLU/RSV plus assay is intended as an aid in the diagnosis of influenza from Nasopharyngeal swab specimens and should not be used as a sole basis for treatment. Nasal washings and aspirates are unacceptable for Xpert Xpress SARS-CoV-2/FLU/RSV testing.  Fact Sheet for Patients: BloggerCourse.com  Fact Sheet for Healthcare Providers: SeriousBroker.it  This test is not yet approved or cleared by the [place] FDA and has been authorized for detection and/or diagnosis of SARS-CoV-2 by FDA under an Emergency Use Authorization (EUA). This EUA will remain in effect (meaning this test can be used) for the duration of the COVID-19 declaration under Section 564(b)(1) of the Act, 21 U.S.C. section 360bbb-3(b)(1), unless the authorization is terminated or revoked.     Resp Syncytial Virus by PCR NEGATIVE NEGATIVE Final    Comment: (NOTE) Fact Sheet for Patients: BloggerCourse.com  Fact Sheet for Healthcare Providers: SeriousBroker.it  This test is not yet approved or cleared by the [place] FDA and has been  authorized for detection and/or diagnosis of SARS-CoV-2 by FDA under an Emergency Use Authorization (EUA). This EUA will remain in effect (meaning this test can be used) for the duration of the COVID-19 declaration under Section 564(b)(1) of the Act, 21 U.S.C. section 360bbb-3(b)(1), unless the authorization is terminated or revoked.  Performed at [facility], [address] [place], [place] [address]     Labs: CBC: Recent Labs  Lab 08/20/22 1859 08/21/22 0431  WBC 6.0 5.8  NEUTROABS 4.0  --   HGB 12.3 11.9*  HCT 38.3 37.1  MCV 92.3 93.0  PLT 193 169   Basic Metabolic Panel: Recent Labs  Lab 08/20/22 1859 08/21/22 0431  NA 139 140  K 4.1 3.9  CL 107 113*  CO2 25 22  GLUCOSE 130* 109*  BUN 17 14  CREATININE 1.29* 1.03*  CALCIUM 8.8* 8.8*  Liver Function Tests: Recent Labs  Lab 08/20/22 1859  AST 27  ALT 20  ALKPHOS 56  BILITOT 0.5  PROT 7.0  ALBUMIN 3.2*   CBG: Recent Labs  Lab 08/20/22 1839  GLUCAP 127*    Discharge time spent: greater than 30 minutes.  Signed: Vassie Loll, MD [facility] 08/21/2022

## 2022-08-21 NOTE — TOC Initial Note (Signed)
Transition of Care [facility]) - Initial/Assessment Note    Patient Details  Name: [name] MRN: [id number] Date of Birth: 22-Jul-1934  [facility] CM/SW Contact:    Iona Beard, [place] Phone Number: 08/21/2022, 12:10 PM  Clinical Narrative:                 CSW updated that PT is recommending outpatient PT at discharge. CSW spoke with pt and daughter about [facility] PT vs outpatient PT. Pt states that she would like to have outpatient PT set up rather than HH. CSW made outpatient PT referral. TOC to follow.   Expected Discharge Plan: OP Rehab Barriers to Discharge: Continued Medical Work up   Patient Goals and CMS Choice Patient states their goals for this hospitalization and ongoing recovery are:: return home CMS Medicare.gov Compare Post Acute Care list provided to:: Patient Choice offered to / list presented to : Patient, Adult Children  Expected Discharge Plan and Services Expected Discharge Plan: OP Rehab In-house Referral: Clinical Social Work Discharge Planning Services: CM Consult   Living arrangements for the past 2 months: Single Family Home                                      Prior Living Arrangements/Services Living arrangements for the past 2 months: Single Family Home Lives with:: Self Patient language and need for interpreter reviewed:: Yes Do you feel safe going back to the place where you live?: Yes      Need for Family Participation in Patient Care: Yes (Comment) Care giver support system in place?: Yes (comment)   Criminal Activity/Legal Involvement Pertinent to Current Situation/Hospitalization: No - Comment as needed  Activities of Daily Living Home Assistive Devices/Equipment: Gilford Rile (specify type) ADL Screening (condition at time of admission) Patient's cognitive ability adequate to safely complete daily activities?: Yes Is the patient deaf or have difficulty hearing?: Yes Does the patient have difficulty seeing, even when  wearing glasses/contacts?: No Does the patient have difficulty concentrating, remembering, or making decisions?: No Patient able to express need for assistance with ADLs?: Yes Does the patient have difficulty dressing or bathing?: No Independently performs ADLs?: Yes (appropriate for developmental age) Does the patient have difficulty walking or climbing stairs?: Yes Weakness of Legs: Both Weakness of Arms/Hands: None  Permission Sought/Granted                  Emotional Assessment Appearance:: Appears stated age Attitude/Demeanor/Rapport: Engaged Affect (typically observed): Accepting Orientation: : Oriented to Self, Oriented to Place, Oriented to  Time, Oriented to Situation Alcohol / Substance Use: Not Applicable Psych Involvement: No (comment)  Admission diagnosis:  Acute CVA (cerebrovascular accident) [facility]) [I63.9] Cerebrovascular accident (CVA), unspecified mechanism ([place]) [I63.9] Patient Active Problem List   Diagnosis Date Noted   Acute CVA (cerebrovascular accident) ([place]) 11/06/2021   Dyslipidemia 11/06/2021   GERD without esophagitis 11/06/2021   CKD (chronic kidney disease), stage III ([place]) 11/23/2016   Pericardial effusion 11/23/2016   Complete heart block (HCC) 11/16/2016   Non-ST elevation (NSTEMI) myocardial infarction ([place]) 11/16/2016   Renal insufficiency 11/03/2015   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 09/06/2015   Uncontrolled hypertension 08/31/2015   HTN (hypertension), malignant    Symptomatic bradycardia    Hyperlipidemia    Epigastric fullness    Near syncope 08/28/2015   CAD S/P PCI    Essential hypertension 03/05/2013  Obesity (BMI 30-39.9) 03/05/2013   Hyperlipidemia with target LDL less than 70 03/05/2013   Obstructive sleep apnea 03/05/2013   PCP:  [name]:   [facility] [phone] - [place], [place] - [address] AT [facility] [address] [place] [address] Phone:  [phone] Fax: [phone]     Social Determinants of Health (SDOH) Interventions    Readmission Risk Interventions    08/21/2022   12:03 PM  Readmission Risk Prevention Plan  Transportation Screening Complete  Home Care Screening Complete  Medication Review (RN CM) Complete

## 2022-08-21 NOTE — Evaluation (Signed)
Physical Therapy Evaluation Patient Details Name: [name] MRN: [id number] DOB: October 23, 1933 Today's Date: 08/21/2022  History of Present Illness  [name] is a 88 y.o. African-American female with medical history significant for osteoarthritis, CVA, hypertension, dyslipidemia, coronary artery disease, GERD, OSA and obesity, who presented to the emergency room with acute onset of expressive dysphasia that started about 5 days ago.  The patient lives at home alone and has been independent.  Her daughters live close to her and frequently check on her.  Her family noticed that last week she was having difficulty finding words and her symptoms persisted.  She has been having frontal headache over the last couple of days for which she has been taking Tylenol with improvement.  She was recently seen in urgent care and treated for URI.  This was before her symptoms started.  She had a CVA earlier this year without residual deficits.  No unilateral muscle weakness or paresthesias.  No gait abnormalities.  No visual loss, blurred vision or diplopia, tinnitus or vertigo.  No fever or chills.  No chest pain or palpitations.  No cough or wheezing.  No nausea or vomiting or abdominal pain.  No dysuria, oliguria or hematuria or flank pain.   Clinical Impression  Patient functioning near baseline for functional mobility and gait demonstrating labored movement for sitting up at bedside, good return for using RW during gait training without loss of balance with occasional drifting to the right and able to self correct after verbal cues.  Patient will benefit from continued skilled physical therapy in hospital and recommended venue below to increase strength, balance, endurance for safe ADLs and gait.         Recommendations for follow up therapy are one component of a multi-disciplinary discharge planning process, led by the attending physician.  Recommendations may be updated based on patient status,  additional functional criteria and insurance authorization.  Follow Up Recommendations Outpatient PT      Assistance Recommended at Discharge PRN  Patient can return home with the following  A little help with walking and/or transfers;A little help with bathing/dressing/bathroom;Help with stairs or ramp for entrance;Assistance with cooking/housework    Equipment Recommendations None recommended by PT  Recommendations for Other Services       Functional Status Assessment Patient has had a recent decline in their functional status and demonstrates the ability to make significant improvements in function in a reasonable and predictable amount of time.     Precautions / Restrictions Precautions Precautions: Fall Restrictions Weight Bearing Restrictions: No      Mobility  Bed Mobility Overal bed mobility: Needs Assistance Bed Mobility: Supine to Sit, Sit to Supine     Supine to sit: Min assist Sit to supine: Modified independent (Device/Increase time)   General bed mobility comments: required hand held during supine to sitting with labored movement    Transfers Overall transfer level: Needs assistance Equipment used: Rolling walker (2 wheels) Transfers: Sit to/from Stand, Bed to chair/wheelchair/BSC Sit to Stand: Supervision   Step pivot transfers: Supervision       General transfer comment: good return for bed<>chair transfers without AD and using RW    Ambulation/Gait Ambulation/Gait assistance: Supervision, Min guard Gait Distance (Feet): 100 Feet Assistive device: Rolling walker (2 wheels) Gait Pattern/deviations: Decreased step length - right, Decreased step length - left, Decreased stride length, Drifts right/left Gait velocity: decreased     General Gait Details: slow labored movement with very short step/stride length when taking  steps without AD, required RW for safety with occasional veering to the right requiring verbal cues to avoid hitting nearby  objects with good carryover demonstrated  Stairs            Wheelchair Mobility    Modified Rankin (Stroke Patients Only)       Balance Overall balance assessment: Needs assistance Sitting-balance support: Feet supported, No upper extremity supported Sitting balance-Leahy Scale: Good Sitting balance - Comments: seated EOB   Standing balance support: During functional activity, No upper extremity supported Standing balance-Leahy Scale: Fair Standing balance comment: fair/good using RW                             Pertinent Vitals/Pain Pain Assessment Pain Assessment: No/denies pain    Home Living Family/patient expects to be discharged to:: Private residence Living Arrangements: Children Available Help at Discharge: Family;Friend(s);Available 24 hours/day Type of Home: House Home Access: Stairs to enter Entrance Stairs-Rails: Can reach both Entrance Stairs-Number of Steps: 6   Home Layout: One level Home Equipment: Rollator (4 wheels);Cane - single point;Shower seat;Grab bars - tub/shower;Rolling Walker (2 wheels) Additional Comments: Pt's family reported no change in previous living history.    Prior Function Prior Level of Function : Needs assist       Physical Assist : ADLs (physical)   ADLs (physical): Bathing Mobility Comments: Pt uses RW and Rollator within the house since June. ADLs Comments: reports family assists with IADLs, she does not drive; family reported that they assist with preparing the bath for pt.     Hand Dominance   Dominant Hand: Right    Extremity/Trunk Assessment   Upper Extremity Assessment Upper Extremity Assessment: Defer to OT evaluation RUE Deficits / Details: Limited to 2+/5 for shoulder flexion. 4-/5 for elbow flexion, 3+/5 elbow extension; 4-/5 grip. RUE Coordination: WNL LUE Deficits / Details: Generally weak.    Lower Extremity Assessment Lower Extremity Assessment: Overall WFL for tasks assessed;RLE  deficits/detail RLE Deficits / Details: grossly -4/5 which is baseline per patient RLE Sensation: WNL RLE Coordination: WNL    Cervical / Trunk Assessment Cervical / Trunk Assessment: Normal  Communication   Communication: No difficulties  Cognition Arousal/Alertness: Awake/alert Behavior During Therapy: WFL for tasks assessed/performed Overall Cognitive Status: Within Functional Limits for tasks assessed                                          General Comments      Exercises     Assessment/Plan    PT Assessment Patient needs continued PT services  PT Problem List Decreased strength;Decreased activity tolerance;Decreased balance;Decreased mobility       PT Treatment Interventions DME instruction;Gait training;Stair training;Functional mobility training;Therapeutic exercise;Therapeutic activities;Patient/family education;Balance training    PT Goals (Current goals can be found in the Care Plan section)  Acute Rehab PT Goals Patient Stated Goal: return home with family to assist PT Goal Formulation: With patient/family Time For Goal Achievement: 08/24/22 Potential to Achieve Goals: Good    Frequency Min 3X/week     Co-evaluation PT/OT/SLP Co-Evaluation/Treatment: Yes Reason for Co-Treatment: To address functional/ADL transfers PT goals addressed during session: Mobility/safety with mobility;Balance;Proper use of DME OT goals addressed during session: ADL's and self-care       AM-PAC PT "6 Clicks" Mobility  Outcome Measure Help needed turning from your back to your  side while in a flat bed without using bedrails?: None Help needed moving from lying on your back to sitting on the side of a flat bed without using bedrails?: A Little Help needed moving to and from a bed to a chair (including a wheelchair)?: A Little Help needed standing up from a chair using your arms (e.g., wheelchair or bedside chair)?: A Little Help needed to walk in hospital  room?: A Little Help needed climbing 3-5 steps with a railing? : A Lot 6 Click Score: 18    End of Session   Activity Tolerance: Patient tolerated treatment well;Patient limited by fatigue Patient left: in bed;with family/visitor present;with call bell/phone within reach Nurse Communication: Mobility status PT Visit Diagnosis: Unsteadiness on feet (R26.81);Other abnormalities of gait and mobility (R26.89);Muscle weakness (generalized) (M62.81)    Time: 3536-1443 PT Time Calculation (min) (ACUTE ONLY): 30 min   Charges:   PT Evaluation $PT Eval Moderate Complexity: 1 Mod PT Treatments $Therapeutic Activity: 23-37 mins        12:04 PM, 08/21/22 Ocie Bob, MPT Physical Therapist with [facility] [phone] office [phone] mobile phone

## 2022-08-21 NOTE — Evaluation (Signed)
Speech Language Pathology Evaluation Patient Details Name: [name] MRN: [id number] DOB: 1933/11/26 Today's Date: 08/21/2022 Time: 7829-5621 SLP Time Calculation (min) (ACUTE ONLY): 24 min  Problem List:  Patient Active Problem List   Diagnosis Date Noted   Chronic kidney disease, stage III (moderate) (HCC) 08/21/2022   Acute CVA (cerebrovascular accident) (HCC) 11/06/2021   Dyslipidemia 11/06/2021   GERD without esophagitis 11/06/2021   CKD (chronic kidney disease), stage III (HCC) 11/23/2016   Pericardial effusion 11/23/2016   Complete heart block (HCC) 11/16/2016   Non-ST elevation (NSTEMI) myocardial infarction (HCC) 11/16/2016   Renal insufficiency 11/03/2015   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 09/06/2015   Uncontrolled hypertension 08/31/2015   HTN (hypertension), malignant    Symptomatic bradycardia    Hyperlipidemia    Epigastric fullness    Near syncope 08/28/2015   CAD S/P PCI    Essential hypertension 03/05/2013   Class 1 obesity 03/05/2013   Hyperlipidemia with target LDL less than 70 03/05/2013   Obstructive sleep apnea 03/05/2013   Past Medical History:  Past Medical History:  Diagnosis Date   Arthritis    CKD (chronic kidney disease), stage III (HCC)    Complete heart block (HCC) 11/16/2016   a. transient during NSTEMI, resolved with revascularization.   Coronary artery disease 11/2009   a. with prior stent placement to the ramus intermedius and RCA. b. NSTEMI 11/2016 s/p DES to DES to distal Cx with moderate residual dz.   CVA (cerebral vascular accident) [facility])    Essential hypertension    Mixed hyperlipidemia    Non-ST elevation (NSTEMI) myocardial infarction (HCC) 11/16/2016   Obesity    Osteoarthritis    Reflux esophagitis    Sleep apnea 09/27/2010   Untreated, REM 64.7/hr AHI 18.5/hr RDI 19.2/hr. Patuent refused CPAP therapy   Past Surgical History:  Past Surgical History:  Procedure Laterality Date    CHOLECYSTECTOMY     CORONARY ANGIOPLASTY WITH STENT PLACEMENT  2007   A 3.0x71mm CYPHER stent post dilated to 3.29 mm the 100% occlusion was reduced to 0%   CORONARY STENT INTERVENTION N/A 11/16/2016   Procedure: Coronary Stent Intervention;  Surgeon: Tonny Bollman, MD;  Location: [facility] INVASIVE CV LAB;  Service: Cardiovascular;  Laterality: N/A;   LEFT HEART CATH AND CORONARY ANGIOGRAPHY N/A 11/16/2016   Procedure: Left Heart Cath and Coronary Angiography;  Surgeon: Tonny Bollman, MD;  Location: [facility] INVASIVE CV LAB;  Service: Cardiovascular;  Laterality: N/A;   LEFT HEART CATHETERIZATION WITH CORONARY ANGIOGRAM N/A 07/12/2014   Procedure: LEFT HEART CATHETERIZATION WITH CORONARY ANGIOGRAM;  Surgeon: Micheline Chapman, MD;  Location: [facility] CATH LAB;  Service: Cardiovascular;  Laterality: N/A;   TEMPORARY PACEMAKER N/A 11/16/2016   Procedure: Temporary Pacemaker;  Surgeon: Tonny Bollman, MD;  Location: [facility] INVASIVE CV LAB;  Service: Cardiovascular;  Laterality: N/A;   HPI:  [name] is a 88 y.o. African-American female with medical history significant for osteoarthritis, CVA, hypertension, dyslipidemia, coronary artery disease, GERD, OSA and obesity, who presented to the emergency room on 08/20/22 with acute onset of expressive aphasia that started about 5 days ago.  The patient lives at home alone and has been independent.  Her daughters live close to her and frequently check on her.  Her family noticed that last week she was having difficulty finding words and her symptoms persisted.  She has been having frontal headache over the last couple of days for which she has been taking Tylenol with improvement.  She was recently seen in urgent care and treated for URI.  This was before her symptoms started.  She had a CVA earlier this year without residual deficits. MRI brain on 08/21/11 indicated Multifocal punctate acute ischemia in the left hemisphere, within  multiple vascular territories. The greatest number  are located in  the medial left temporal lobe. Distribution is most consistent with  embolic disease from the left carotid or distal aortic arch.  2. Normal intracranial MRA.  SLE generated to assess speech, language and cognitive skills.   Assessment / Plan / Recommendation Clinical Impression  Pt was assessed via portions of the [employer] Mental Status Examination (SLUMS) with baseline deficits noted and expressed by pt/family including decreased recall of new information/STM and intermittent word finding deficits occasionally in conversation.  Pt was oriented x3 and OME unremarkable.  Speech was noted to be intelligible throughout conversation, but memory recall impacted word finding in conversation.  Pt's word fluency skills were also decreased with category cues required to complete task which may also be impacted by memory recall deficits.  Responsive naming and confrontational naming tasks were grossly within normal limits.  Pt able to recall digits backwards up to 3 digits, but 4+ were more challenging.  Pt's visual acuity was also questionable with spatial organization task with clock completion.  Unsure what baseline vision status was, but pt did wear bifocals during assessment for environmental reading task and writing portion of SLUMS.  Pt may be functioning at baseline, but if symptoms persist, HH SLP may be necessary if pt/family desire upon d/c. ST will s/o in this setting.    SLP Assessment  SLP Recommendation/Assessment: All further Speech Language Pathology  needs can be addressed in the next venue of care if symptoms persist SLP Visit Diagnosis: Aphasia (R47.01);Cognitive communication deficit (R41.841)    Recommendations for follow up therapy are one component of a multi-disciplinary discharge planning process, led by the attending physician.  Recommendations may be updated based on patient status, additional functional criteria and insurance authorization.    Follow Up  Recommendations  Follow physician's recommendations for discharge plan and follow up therapies    Assistance Recommended at Discharge  Intermittent Supervision/Assistance  Functional Status Assessment Patient has had a recent decline in their functional status and demonstrates the ability to make significant improvements in function in a reasonable and predictable amount of time.  Frequency and Duration Other (Comment) (evaluation only)         SLP Evaluation Cognition  Overall Cognitive Status: History of cognitive impairments - at baseline Arousal/Alertness: Awake/alert Orientation Level: Oriented to person;Oriented to place;Oriented to situation;Disoriented to time Month: December Day of Week: Correct Attention: Sustained Sustained Attention: Impaired Sustained Attention Impairment: Verbal basic;Functional basic (question anxiety d/t family health situation (son)) Memory: Impaired Memory Impairment: Retrieval deficit;Decreased recall of new information;Decreased short term memory Decreased Short Term Memory: Verbal basic;Functional basic Awareness: Appears intact Problem Solving: Appears intact Behaviors: Perseveration Safety/Judgment: Appears intact       Comprehension  Auditory Comprehension Overall Auditory Comprehension: Appears within functional limits for tasks assessed Yes/No Questions: Within Functional Limits Commands: Impaired Complex Commands: 50-74% accurate Conversation: Simple Other Conversation Comments: intermittent anomia within conversation Interfering Components: Anxiety;Visual impairments;Working [employer] fast streamermemory;Attention EffectiveTechniques: Extra processing time;Increased volume;Repetition Visual Recognition/Discrimination Discrimination: Exceptions to [facility] SurgeryWFL Other Visual Recogniton/Discrimination Comments: vision accuracy Reading Comprehension Reading Status: Impaired Interfering Components: Working memory;Visual acuity Effective Techniques: Eye  glasses;Verbal cueing    Expression Expression Primary Mode of Expression: Verbal Verbal Expression Overall  Verbal Expression: Impaired Initiation: No impairment Level of Generative/Spontaneous Verbalization: Conversation Naming: Impairment Responsive: 76-100% accurate Confrontation: Within functional limits Convergent: Not tested Divergent: 50-74% accurate Other Naming Comments: perseveration Verbal Errors: Perseveration Effective Techniques: Semantic cues;Sentence completion;Other (Comment) (catgory) Non-Verbal Means of Communication: Not applicable Written Expression Dominant Hand: Right Written Expression: Exceptions to [facility] Interfering Components: Thought organization   Oral / Motor  Oral Motor/Sensory Function Overall Oral Motor/Sensory Function: Within functional limits Motor Speech Overall Motor Speech: Appears within functional limits for tasks assessed Respiration: Within functional limits Phonation: Normal Resonance: Within functional limits Articulation: Within functional limitis Intelligibility: Intelligible Motor Planning: Witnin functional limits Motor Speech Errors: Not applicable Interfering Components: Premorbid status            Tressie Stalker, M.S., CCC-SLP 08/21/2022, 8:04 PM

## 2022-08-22 LAB — GLUCOSE, CAPILLARY: Glucose-Capillary: 137 mg/dL — ABNORMAL HIGH (ref 70–99)

## 2022-09-02 ENCOUNTER — Other Ambulatory Visit: Payer: Self-pay | Admitting: Family Medicine

## 2022-09-05 ENCOUNTER — Ambulatory Visit
Admission: EM | Admit: 2022-09-05 | Discharge: 2022-09-05 | Disposition: A | Discharge status: Home / Self Care | Payer: Medicare Other | Attending: Nurse Practitioner | Admitting: Nurse Practitioner

## 2022-09-05 DIAGNOSIS — Z20828 Contact with and (suspected) exposure to other viral communicable diseases: Secondary | ICD-10-CM | POA: Diagnosis not present

## 2022-09-05 DIAGNOSIS — Z1152 Encounter for screening for COVID-19: Secondary | ICD-10-CM | POA: Insufficient documentation

## 2022-09-05 DIAGNOSIS — R6889 Other general symptoms and signs: Secondary | ICD-10-CM | POA: Diagnosis not present

## 2022-09-05 MED ORDER — FLUTICASONE PROPIONATE 50 MCG/ACT NA SUSP: 2.0000 | Freq: Every day | NASAL | 0 refills | Status: DC

## 2022-09-05 MED ORDER — OSELTAMIVIR PHOSPHATE 30 MG PO CAPS: 30.0000 mg | ORAL_CAPSULE | Freq: Two times a day (BID) | ORAL | 0 refills | Status: AC

## 2022-09-05 MED ORDER — BENZONATATE 100 MG PO CAPS: 100.0000 mg | ORAL_CAPSULE | Freq: Three times a day (TID) | ORAL | 0 refills | Status: DC | PRN

## 2022-09-05 NOTE — ED Provider Notes (Signed)
[facility]    CSN: [id number] Arrival date & time: 09/05/22  1553      History   Chief Complaint Chief Complaint  Patient presents with   Fever   Cough   Generalized Body Aches   Fatigue    HPI [name] is a 88 y.o. female.   The history is provided by the patient and a relative.   The patient was brought in by her son for complaints of fever, body aches, cough, runny nose, and nausea.  Symptoms started over the past 48 hours.  Patient's son and patient deny headache, wheezing, abdominal pain, vomiting, or diarrhea.  Patient's son states patient's sister was diagnosed with the flu 2 days ago.  Patient has been taking Tylenol and Mucinex for her symptoms.  Patient reports that she has not been vaccinated for influenza, but has been vaccinated for COVID.  Past Medical History:  Diagnosis Date   Arthritis    CKD (chronic kidney disease), stage III (HCC)    Complete heart block (HCC) 11/16/2016   a. transient during NSTEMI, resolved with revascularization.   Coronary artery disease 11/2009   a. with prior stent placement to the ramus intermedius and RCA. b. NSTEMI 11/2016 s/p DES to DES to distal Cx with moderate residual dz.   CVA (cerebral vascular accident) [facility])    Essential hypertension    Mixed hyperlipidemia    Non-ST elevation (NSTEMI) myocardial infarction (HCC) 11/16/2016   Obesity    Osteoarthritis    Reflux esophagitis    Sleep apnea 09/27/2010   Untreated, REM 64.7/hr AHI 18.5/hr RDI 19.2/hr. Patuent refused CPAP therapy    Patient Active Problem List   Diagnosis Date Noted   Chronic kidney disease, stage III (moderate) (HCC) 08/21/2022   Acute CVA (cerebrovascular accident) (HCC) 11/06/2021   Dyslipidemia 11/06/2021   GERD without esophagitis 11/06/2021   CKD (chronic kidney disease), stage III (HCC) 11/23/2016   Pericardial effusion 11/23/2016   Complete heart block (HCC) 11/16/2016   Non-ST elevation (NSTEMI) myocardial infarction  (HCC) 11/16/2016   Renal insufficiency 11/03/2015   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 09/06/2015   Uncontrolled hypertension 08/31/2015   HTN (hypertension), malignant    Symptomatic bradycardia    Hyperlipidemia    Epigastric fullness    Near syncope 08/28/2015   CAD S/P PCI    Essential hypertension 03/05/2013   Class 1 obesity 03/05/2013   Hyperlipidemia with target LDL less than 70 03/05/2013   Obstructive sleep apnea 03/05/2013    Past Surgical History:  Procedure Laterality Date   CHOLECYSTECTOMY     CORONARY ANGIOPLASTY WITH STENT PLACEMENT  2007   A 3.0x66mm CYPHER stent post dilated to 3.29 mm the 100% occlusion was reduced to 0%   CORONARY STENT INTERVENTION N/A 11/16/2016   Procedure: Coronary Stent Intervention;  Surgeon: Tonny Bollman, MD;  Location: [facility] INVASIVE CV LAB;  Service: Cardiovascular;  Laterality: N/A;   LEFT HEART CATH AND CORONARY ANGIOGRAPHY N/A 11/16/2016   Procedure: Left Heart Cath and Coronary Angiography;  Surgeon: Tonny Bollman, MD;  Location: [facility] INVASIVE CV LAB;  Service: Cardiovascular;  Laterality: N/A;   LEFT HEART CATHETERIZATION WITH CORONARY ANGIOGRAM N/A 07/12/2014   Procedure: LEFT HEART CATHETERIZATION WITH CORONARY ANGIOGRAM;  Surgeon: Micheline Chapman, MD;  Location: [facility] CATH LAB;  Service: Cardiovascular;  Laterality: N/A;   TEMPORARY PACEMAKER N/A 11/16/2016   Procedure: Temporary Pacemaker;  Surgeon: Tonny Bollman, MD;  Location: [facility] INVASIVE CV LAB;  Service: Cardiovascular;  Laterality: N/A;    OB History   No obstetric history on file.      Home Medications    Prior to Admission medications   Medication Sig Start Date End Date Taking? Authorizing Provider  acetaminophen (TYLENOL) 500 MG tablet Take 1,000 mg by mouth every 6 (six) hours as needed for mild pain or moderate pain.   Yes [provider]  amLODipine (NORVASC) 5 MG tablet TAKE 1 TABLET BY MOUTH EVERY DAY 04/05/22  Yes Jonelle SidleMcDowell,  Samuel G, MD  aspirin 81 MG tablet Take 1 tablet (81 mg total) by mouth daily for 21 days. 08/21/22 09/11/22 Yes Vassie LollMadera, Carlos, MD  azelastine (ASTELIN) 0.1 % nasal spray Place 1 spray into both nostrils 2 (two) times daily. Use in each nostril as directed 08/11/22  Yes Particia NearingLane, Rachel Elizabeth, PA-C  benzonatate (TESSALON PERLES) 100 MG capsule Take 1 capsule (100 mg total) by mouth 3 (three) times daily as needed for cough. 09/05/22  Yes Ellan Tess-Warren, Sadie Haberhristie J, NP  carvedilol (COREG) 6.25 MG tablet Take 6.25 mg by mouth 2 (two) times daily with a meal.   Yes [provider]  clopidogrel (PLAVIX) 75 MG tablet Take 1 tablet (75 mg total) by mouth daily. 08/22/22  Yes Vassie LollMadera, Carlos, MD  fluticasone [facility](FLONASE) 50 MCG/ACT nasal spray Place 2 sprays into both nostrils daily. 09/05/22  Yes Estefania Kamiya-Warren, Sadie Haberhristie J, NP  furosemide (LASIX) 20 MG tablet Take 20 mg by mouth daily as needed for fluid. 01/05/22  Yes [provider]  guaiFENesin (MUCINEX) 600 MG 12 hr tablet Take 1 tablet (600 mg total) by mouth 2 (two) times daily. 08/11/22  Yes Particia NearingLane, Rachel Elizabeth, PA-C  hydrALAZINE (APRESOLINE) 50 MG tablet TAKE 1 TABLET BY MOUTH TWICE A DAY 04/30/22  Yes Jonelle SidleMcDowell, Samuel G, MD  losartan (COZAAR) 50 MG tablet TAKE 1 TABLET BY MOUTH EVERY DAY 04/05/22  Yes Jonelle SidleMcDowell, Samuel G, MD  Menthol, Topical Analgesic, (BIOFREEZE ROLL-ON) 4 % GEL Apply 1 application. topically daily as needed (pain).   Yes [provider]  Menthol, Topical Analgesic, (BIOFREEZE) 4 % GEL Apply 1 patch topically daily as needed (knee pain).   Yes [provider]  oseltamivir (TAMIFLU) 30 MG capsule Take 1 capsule (30 mg total) by mouth 2 (two) times daily for 5 days. 09/05/22 09/10/22 Yes Jarrett Albor-Warren, Sadie Haberhristie J, NP  pantoprazole (PROTONIX) 40 MG tablet TAKE 1 TABLET BY MOUTH EVERY DAY 04/30/22  Yes Jonelle SidleMcDowell, Samuel G, MD  rosuvastatin (CRESTOR) 40 MG tablet TAKE 1 TABLET BY MOUTH EVERY DAY 10/03/21  Yes  Jonelle SidleMcDowell, Samuel G, MD  Vitamin D, Ergocalciferol, (DRISDOL) 1.25 MG (50000 UNIT) CAPS capsule Take 50,000 Units by mouth every 30 (thirty) days. 12/08/21  Yes [provider]  nitroGLYCERIN (NITROSTAT) 0.4 MG SL tablet PLACE 1 TABLET UNDER THE TONGUE EVERY 5 MINUTES AS NEEDED. 10/26/21   Jonelle SidleMcDowell, Samuel G, MD    Family History Family History  Problem Relation Age of Onset   Hypertension Mother    Aneurysm Mother        died at age 86   Other Father        unsure of medical history   Hypertension Other     Social History Social History   Tobacco Use   Smoking status: Never   Smokeless tobacco: Never  Vaping Use   Vaping Use: Never used  Substance Use Topics   Alcohol use: No   Drug use: No     Allergies  Atorvastatin, Contrast media [iodinated contrast media], Darvon [propoxyphene], and Codeine   Review of Systems Review of Systems Per HPI  Physical Exam Triage Vital Signs ED Triage Vitals [09/05/22 1736]  Enc Vitals Group     BP 123/67     Pulse Rate 72     Resp 16     Temp 98.2 F (36.8 C)     Temp Source Oral     SpO2 96 %     Weight      Height      Head Circumference      Peak Flow      Pain Score 5     Pain Loc      Pain Edu?      Excl. in GC?    No data found.  Updated Vital Signs BP 123/67 (BP Location: Right Arm)   Pulse 72   Temp 98.2 F (36.8 C) (Oral)   Resp 16   SpO2 96%   Visual Acuity Right Eye Distance:   Left Eye Distance:   Bilateral Distance:    Right Eye Near:   Left Eye Near:    Bilateral Near:     Physical Exam Vitals and nursing note reviewed.  Constitutional:      General: She is not in acute distress.    Appearance: Normal appearance.  HENT:     Head: Normocephalic.     Right Ear: Tympanic membrane, ear canal and external ear normal.     Left Ear: Tympanic membrane, ear canal and external ear normal.     Nose: Congestion present. No rhinorrhea.     Right Turbinates: Enlarged and swollen.     Left  Turbinates: Enlarged and swollen.     Right Sinus: No maxillary sinus tenderness or frontal sinus tenderness.     Left Sinus: No maxillary sinus tenderness or frontal sinus tenderness.     Mouth/Throat:     Lips: Pink.     Mouth: Mucous membranes are moist.     Pharynx: Uvula midline. Posterior oropharyngeal erythema present. No pharyngeal swelling.  Eyes:     Extraocular Movements: Extraocular movements intact.     Conjunctiva/sclera: Conjunctivae normal.     Pupils: Pupils are equal, round, and reactive to light.  Cardiovascular:     Rate and Rhythm: Regular rhythm.     Pulses: Normal pulses.     Heart sounds: Normal heart sounds.  Pulmonary:     Effort: Pulmonary effort is normal. No respiratory distress.     Breath sounds: Normal breath sounds. No stridor. No wheezing, rhonchi or rales.  Abdominal:     General: Bowel sounds are normal.     Palpations: Abdomen is soft.     Tenderness: There is no abdominal tenderness.  Musculoskeletal:     Cervical back: Normal range of motion.  Lymphadenopathy:     Cervical: No cervical adenopathy.  Skin:    General: Skin is warm and dry.  Neurological:     General: No focal deficit present.     Mental Status: She is alert and oriented to person, place, and time.  Psychiatric:        Mood and Affect: Mood normal.        Behavior: Behavior normal.      UC Treatments / Results  Labs (all labs ordered are listed, but only abnormal results are displayed) Labs Reviewed  SARS CORONAVIRUS 2 (TAT 6-24 HRS)    EKG   Radiology No results found.  Procedures  Procedures (including critical care time)  Medications Ordered in UC Medications - No data to display  Initial Impression / Assessment and Plan / UC Course  I have reviewed the triage vital signs and the nursing notes.  Pertinent labs & imaging results that were available during my care of the patient were reviewed by me and considered in my medical decision making (see chart  for details).  The patient is well-appearing, she is in no acute distress, vital signs are stable.  Symptoms are consistent with influenza.  Patient with recent exposure and she is currently presenting with flulike symptoms.  COVID test is pending.  Patient is a candidate to receive molnupiravir if her COVID test is positive.  Will start patient on Tamiflu 30 mg twice daily for 5 days (renal dosing provided).  Patient was also prescribed benzonatate 100 mg for her cough and fluticasone 50 mcg nasal spray for her nasal congestion.  Advised the patient's son regarding viral etiology.  Advised that patient follow-up with her primary care physician within the next 5 to 7 days for reevaluation.  Patient's son was given strict ER precautions.  Patient's son verbalizes understanding.  All questions were answered.  Patient is stable for discharge.   Final Clinical Impressions(s) / UC Diagnoses   Final diagnoses:  Encounter for screening for COVID-19  Flu-like symptoms  Exposure to influenza     Discharge Instructions      COVID test is pending.  You will be contacted if the pending test results are positive.  If you elect to do so, the patient is a candidate to receive molnupiravir if her COVID test is positive. Take medication as prescribed. Continue Tylenol as needed for pain, fever, or general discomfort.  You may administer Tylenol arthritis strength 650 mg tablet for pain or fever control. Increase fluids and allow for plenty of rest. Warm salt water gargles 3-4 times daily to help with throat pain or discomfort. Recommend use of a humidifier in her bedroom at nighttime during sleep and having her sleep elevated on pillows while cough symptoms persist. As discussed, please follow-up with her primary care physician within the next 5 to 7 days for reevaluation. If she develops worsening symptoms to include fever, worsening cough, shortness of breath, difficulty breathing, or other concerns,  please go to the emergency department immediately for further evaluation. Follow-up as needed.     ED Prescriptions     Medication Sig Dispense Auth. Provider   oseltamivir (TAMIFLU) 30 MG capsule Take 1 capsule (30 mg total) by mouth 2 (two) times daily for 5 days. 10 capsule Buell Parcel-Warren, Sadie Haber, NP   fluticasone (FLONASE) 50 MCG/ACT nasal spray Place 2 sprays into both nostrils daily. 16 g Cynitha Berte-Warren, Sadie Haber, NP   benzonatate (TESSALON PERLES) 100 MG capsule Take 1 capsule (100 mg total) by mouth 3 (three) times daily as needed for cough. 30 capsule Maleigh Bagot-Warren, Sadie Haber, NP      PDMP not reviewed this encounter.   Abran Cantor, NP 09/05/22 1815

## 2022-09-05 NOTE — ED Triage Notes (Signed)
Body aches, fever at home of 100, cough, runny nose, nausea, 2 days ago.  Sister was positive for the flu 2 days ago. Taking tylenol and mucinex

## 2022-09-05 NOTE — Discharge Instructions (Signed)
COVID test is pending.  You will be contacted if the pending test results are positive.  If you elect to do so, the patient is a candidate to receive molnupiravir if her COVID test is positive. Take medication as prescribed. Continue Tylenol as needed for pain, fever, or general discomfort.  You may administer Tylenol arthritis strength 650 mg tablet for pain or fever control. Increase fluids and allow for plenty of rest. Warm salt water gargles 3-4 times daily to help with throat pain or discomfort. Recommend use of a humidifier in her bedroom at nighttime during sleep and having her sleep elevated on pillows while cough symptoms persist. As discussed, please follow-up with her primary care physician within the next 5 to 7 days for reevaluation. If she develops worsening symptoms to include fever, worsening cough, shortness of breath, difficulty breathing, or other concerns, please go to the emergency department immediately for further evaluation. Follow-up as needed.

## 2022-09-06 LAB — SARS CORONAVIRUS 2 (TAT 6-24 HRS): SARS Coronavirus 2: NEGATIVE

## 2022-09-19 ENCOUNTER — Encounter (HOSPITAL_COMMUNITY): Payer: Self-pay | Admitting: Physical Therapy

## 2022-09-19 ENCOUNTER — Ambulatory Visit (HOSPITAL_COMMUNITY): Payer: Medicare Other | Attending: *Deleted | Admitting: Physical Therapy

## 2022-09-19 DIAGNOSIS — R2689 Other abnormalities of gait and mobility: Secondary | ICD-10-CM

## 2022-09-19 DIAGNOSIS — I639 Cerebral infarction, unspecified: Secondary | ICD-10-CM

## 2022-09-19 NOTE — Therapy (Signed)
[place], [place], [address] Phone: [phone]   Fax:  [phone]  Patient Details  Name: [name] MRN: [id number] Date of Birth: 05/03/1934 Referring Provider:  [facility]  Encounter Date: 09/19/2022  Patient recent CVA with minimal to no impact on function following. She was doing PT for her ankle at the end of last year at a clinic in [place] but has been unable to return due to her son getting sick. She then had her stroke and hadn't had the chance to go back. She continues to ambulate with RW/rollator for mobility and is functioning near baseline. Patient wishes to return to clinic in [place] as she felt comfortable going there.   2:23 PM, 09/19/22 Mearl Latin PT, DPT Physical Therapist at [facility] [place], [place], [address] Phone: [phone]   Fax:  [phone]

## 2022-09-20 DIAGNOSIS — N1832 Chronic kidney disease, stage 3b: Secondary | ICD-10-CM | POA: Diagnosis not present

## 2022-09-24 DIAGNOSIS — N179 Acute kidney failure, unspecified: Secondary | ICD-10-CM | POA: Diagnosis not present

## 2022-09-24 DIAGNOSIS — E559 Vitamin D deficiency, unspecified: Secondary | ICD-10-CM | POA: Diagnosis not present

## 2022-09-24 DIAGNOSIS — N1832 Chronic kidney disease, stage 3b: Secondary | ICD-10-CM | POA: Diagnosis not present

## 2022-10-10 DIAGNOSIS — E559 Vitamin D deficiency, unspecified: Secondary | ICD-10-CM | POA: Diagnosis not present

## 2022-10-10 DIAGNOSIS — N179 Acute kidney failure, unspecified: Secondary | ICD-10-CM | POA: Diagnosis not present

## 2022-10-10 DIAGNOSIS — N1832 Chronic kidney disease, stage 3b: Secondary | ICD-10-CM | POA: Diagnosis not present

## 2022-11-01 ENCOUNTER — Encounter: Payer: Self-pay | Admitting: Internal Medicine

## 2022-11-01 ENCOUNTER — Ambulatory Visit (INDEPENDENT_AMBULATORY_CARE_PROVIDER_SITE_OTHER): Payer: Medicare HMO | Admitting: Internal Medicine

## 2022-11-01 VITALS — BP 144/71 | HR 93 | Ht 65.0 in | Wt 189.4 lb

## 2022-11-01 DIAGNOSIS — M170 Bilateral primary osteoarthritis of knee: Secondary | ICD-10-CM | POA: Diagnosis not present

## 2022-11-01 DIAGNOSIS — I10 Essential (primary) hypertension: Secondary | ICD-10-CM

## 2022-11-01 DIAGNOSIS — G4733 Obstructive sleep apnea (adult) (pediatric): Secondary | ICD-10-CM | POA: Diagnosis not present

## 2022-11-01 DIAGNOSIS — I639 Cerebral infarction, unspecified: Secondary | ICD-10-CM

## 2022-11-01 DIAGNOSIS — Z9861 Coronary angioplasty status: Secondary | ICD-10-CM | POA: Diagnosis not present

## 2022-11-01 DIAGNOSIS — Z2821 Immunization not carried out because of patient refusal: Secondary | ICD-10-CM

## 2022-11-01 DIAGNOSIS — Z0001 Encounter for general adult medical examination with abnormal findings: Secondary | ICD-10-CM | POA: Insufficient documentation

## 2022-11-01 DIAGNOSIS — E785 Hyperlipidemia, unspecified: Secondary | ICD-10-CM

## 2022-11-01 DIAGNOSIS — I2510 Atherosclerotic heart disease of native coronary artery without angina pectoris: Secondary | ICD-10-CM | POA: Diagnosis not present

## 2022-11-01 DIAGNOSIS — N183 Chronic kidney disease, stage 3 unspecified: Secondary | ICD-10-CM | POA: Diagnosis not present

## 2022-11-01 DIAGNOSIS — K219 Gastro-esophageal reflux disease without esophagitis: Secondary | ICD-10-CM

## 2022-11-01 NOTE — Assessment & Plan Note (Signed)
CKD 3A.  She is followed by nephrology (Dr. Juleen China) in [place] and reports that she has follow-up scheduled for next month.  Labs stable when last checked in December 2023.

## 2022-11-01 NOTE — Assessment & Plan Note (Signed)
Presenting today to establish care. -Labs from December 2023 reviewed today. -We will request records from her previous PCP office and update outstanding preventative care items at her follow-up appointment in 3 months.

## 2022-11-01 NOTE — Assessment & Plan Note (Signed)
December 2023.  She presented to the ED endorsing expressive dysphagia.  Imaging revealed multiple left hemispheric punctate ischemic infarcts, mainly in the left temporal lobe.  She was evaluated by neurology and discharged on DAPT x 21 days, followed by Plavix monotherapy.  She is also prescribed rosuvastatin 40 mg daily.  When reviewing medications today, she states that she continues to take ASA 81 mg daily. -I recommended that she discontinue aspirin and continue Plavix monotherapy as instructed upon hospital discharge in December. -Neurology follow-up as scheduled for 12/19/2022.

## 2022-11-01 NOTE — Assessment & Plan Note (Signed)
CAD with DES to distal circumflex in March 2018.  She denies recent chest pain.  She remains on ASA, Plavix, and statin therapy.  Last seen by cardiology (Dr. Domenic Polite) on 05/11/22. -As noted above, recommend discontinuing ASA 81 mg and continuing Plavix monotherapy

## 2022-11-01 NOTE — Patient Instructions (Signed)
It was a pleasure to see you today.  Thank you for giving [place] the opportunity to be involved in your care.  Below is a brief recap of your visit and next steps.  We will plan to see you again in 3 months.  Summary You have established care today I recommend stopping aspirin We will get records from [facility] Follow up in 3 months

## 2022-11-01 NOTE — Progress Notes (Signed)
New Patient Office Visit  Subjective    Patient ID: [name], female    DOB: [birth year]  Age: 89 y.o. MRN: [id number]  CC:  Chief Complaint  Patient presents with   Establish Care   HPI Marializ SHALECE HERRERA presents to establish care.  She is an 89 year old woman with a past medical history significant for CVA (December 2023), CAD s/p PCI (March 2018), HTN, OSA, GERD, CKD 3, hyperlipidemia, prediabetes, lumbar spinal stenosis, and bilateral knee OA.  She has most recently been followed by Dr. Collene Mares at [facility].  Ms. Domingo acute concern today is right knee pain.  She has a known history of severe tricompartmental osteoarthritis and states that her pain has been worse than usual lately.  She is currently in a wheelchair due to impaired ambulation secondary to pain.  Mr. Carrolyn Leigh was previously attending physical therapy, however she has not been recently due to to the recent death of her son.  She is interested in additional treatment options today.  Ms. Stubbins is accompanied by her daughter today.  She is otherwise asymptomatic.  Acute concerns, chronic medical conditions, and outstanding preventative care items discussed today are individually addressed in A/P below.  Outpatient Encounter Medications as of 11/01/2022  Medication Sig   acetaminophen (TYLENOL) 500 MG tablet Take 1,000 mg by mouth every 6 (six) hours as needed for mild pain or moderate pain.   amLODipine (NORVASC) 5 MG tablet TAKE 1 TABLET BY MOUTH EVERY DAY   benzonatate (TESSALON PERLES) 100 MG capsule Take 1 capsule (100 mg total) by mouth 3 (three) times daily as needed for cough.   carvedilol (COREG) 6.25 MG tablet Take 6.25 mg by mouth 2 (two) times daily with a meal.   clopidogrel (PLAVIX) 75 MG tablet Take 1 tablet (75 mg total) by mouth daily.   fluticasone (FLONASE) 50 MCG/ACT nasal spray Place 2 sprays into both nostrils daily.   hydrALAZINE (APRESOLINE) 50 MG tablet TAKE 1 TABLET BY MOUTH TWICE A DAY    losartan (COZAAR) 50 MG tablet TAKE 1 TABLET BY MOUTH EVERY DAY   Menthol, Topical Analgesic, (BIOFREEZE ROLL-ON) 4 % GEL Apply 1 application. topically daily as needed (pain).   nitroGLYCERIN (NITROSTAT) 0.4 MG SL tablet PLACE 1 TABLET UNDER THE TONGUE EVERY 5 MINUTES AS NEEDED.   pantoprazole (PROTONIX) 40 MG tablet TAKE 1 TABLET BY MOUTH EVERY DAY   rosuvastatin (CRESTOR) 40 MG tablet TAKE 1 TABLET BY MOUTH EVERY DAY   Vitamin D, Ergocalciferol, (DRISDOL) 1.25 MG (50000 UNIT) CAPS capsule Take 50,000 Units by mouth every 30 (thirty) days.   [DISCONTINUED] azelastine (ASTELIN) 0.1 % nasal spray Place 1 spray into both nostrils 2 (two) times daily. Use in each nostril as directed   [DISCONTINUED] furosemide (LASIX) 20 MG tablet Take 20 mg by mouth daily as needed for fluid.   [DISCONTINUED] guaiFENesin (MUCINEX) 600 MG 12 hr tablet Take 1 tablet (600 mg total) by mouth 2 (two) times daily.   [DISCONTINUED] Menthol, Topical Analgesic, (BIOFREEZE) 4 % GEL Apply 1 patch topically daily as needed (knee pain).   No facility-administered encounter medications on file as of 11/01/2022.    Past Medical History:  Diagnosis Date   Arthritis    CKD (chronic kidney disease), stage III ([place])    Complete heart block ([place]) 11/16/2016   a. transient during NSTEMI, resolved with revascularization.   Coronary artery disease 11/2009   a. with prior stent placement to the ramus intermedius and RCA.  b. NSTEMI 11/2016 s/p DES to DES to distal Cx with moderate residual dz.   CVA (cerebral vascular accident) [facility])    Essential hypertension    Mixed hyperlipidemia    Non-ST elevation (NSTEMI) myocardial infarction ([place]) 11/16/2016   Obesity    Osteoarthritis    Reflux esophagitis    Sleep apnea 09/27/2010   Untreated, REM 64.7/hr AHI 18.5/hr RDI 19.2/hr. Patuent refused CPAP therapy    Past Surgical History:  Procedure Laterality Date   CHOLECYSTECTOMY     CORONARY ANGIOPLASTY WITH STENT PLACEMENT  2007    A 3.0x64m CYPHER stent post dilated to 3.29 mm the 100% occlusion was reduced to 0%   CORONARY STENT INTERVENTION N/A 11/16/2016   Procedure: Coronary Stent Intervention;  Surgeon: MSherren Mocha MD;  [facility];  Service: Cardiovascular;  Laterality: N/A;   LEFT HEART CATH AND CORONARY ANGIOGRAPHY N/A 11/16/2016   Procedure: Left Heart Cath and Coronary Angiography;  Surgeon: MSherren Mocha MD;  [facility];  Service: Cardiovascular;  Laterality: N/A;   LEFT HEART CATHETERIZATION WITH CORONARY ANGIOGRAM N/A 07/12/2014   Procedure: LEFT HEART CATHETERIZATION WITH CORONARY ANGIOGRAM;  Surgeon: MBlane Ohara MD;  [facility];  Service: Cardiovascular;  Laterality: N/A;   TEMPORARY PACEMAKER N/A 11/16/2016   Procedure: Temporary Pacemaker;  Surgeon: MSherren Mocha MD;  [facility];  Service: Cardiovascular;  Laterality: N/A;    Family History  Problem Relation Age of Onset   Hypertension Mother    Aneurysm Mother        died at age 87  Other Father        unsure of medical history   Hypertension Other     Social History   Socioeconomic History   Marital status: Single    Spouse name: Not on file   Number of children: 9   Years of education: 10th grade   Highest education level: Not on file  Occupational History   Occupation: Retired  Tobacco Use   Smoking status: Never   Smokeless tobacco: Never  Vaping Use   Vaping Use: Never used  Substance and Sexual Activity   Alcohol use: No   Drug use: No   Sexual activity: Never  Other Topics Concern   Not on file  Social History Narrative   Lives alone (children/grandchildren come by daily - sometimes spend the night).   Right-handed.   Nine children (8 living).   Social Determinants of Health   Financial Resource Strain: Not on file  Food Insecurity: No Food Insecurity (08/21/2022)   Hunger Vital Sign    Worried About Running Out of Food in the Last Year: Never  true    Ran Out of Food in the Last Year: Never true  Transportation Needs: No Transportation Needs (08/21/2022)   PRAPARE - THydrologist(Medical): No    Lack of Transportation (Non-Medical): No  Physical Activity: Not on file  Stress: Not on file  Social Connections: Not on file  Intimate Partner Violence: Not At Risk (08/21/2022)   Humiliation, Afraid, Rape, and Kick questionnaire    Fear of Current or Ex-Partner: No    Emotionally Abused: No    Physically Abused: No    Sexually Abused: No   Review of Systems  Musculoskeletal:  Positive for joint pain (Right knee pain).  All other systems reviewed and are negative.  Objective    BP (!) 144/71   Pulse  93   Ht 5' 5"$  (1.651 m)   Wt 189 lb 6.4 oz (85.9 kg)   SpO2 95%   BMI 31.52 kg/m   Physical Exam Vitals reviewed.  Constitutional:      General: She is not in acute distress.    Appearance: Normal appearance. She is obese. She is not toxic-appearing.     Comments: Examined in wheelchair  HENT:     Head: Normocephalic and atraumatic.     Right Ear: External ear normal.     Left Ear: External ear normal.     Nose: Nose normal. No congestion or rhinorrhea.     Mouth/Throat:     Mouth: Mucous membranes are moist.     Pharynx: Oropharynx is clear. No oropharyngeal exudate or posterior oropharyngeal erythema.  Eyes:     General: No scleral icterus.    Extraocular Movements: Extraocular movements intact.     Conjunctiva/sclera: Conjunctivae normal.     Pupils: Pupils are equal, round, and reactive to light.  Cardiovascular:     Rate and Rhythm: Normal rate and regular rhythm.     Pulses: Normal pulses.     Heart sounds: Normal heart sounds. No murmur heard.    No friction rub. No gallop.  Pulmonary:     Effort: Pulmonary effort is normal.     Breath sounds: Normal breath sounds. No wheezing, rhonchi or rales.  Abdominal:     General: Abdomen is flat. Bowel sounds are normal. There is no  distension.     Palpations: Abdomen is soft.     Tenderness: There is no abdominal tenderness.  Musculoskeletal:        General: No swelling. Normal range of motion.     Cervical back: Normal range of motion.     Right lower leg: Edema (Trace RLE edema) present.     Left lower leg: No edema.  Lymphadenopathy:     Cervical: No cervical adenopathy.  Skin:    General: Skin is warm and dry.     Capillary Refill: Capillary refill takes less than 2 seconds.     Coloration: Skin is not jaundiced.  Neurological:     General: No focal deficit present.     Mental Status: She is alert and oriented to person, place, and time.  Psychiatric:        Mood and Affect: Mood normal.        Behavior: Behavior normal.   Last CBC Lab Results  Component Value Date   WBC 5.8 08/21/2022   HGB 11.9 (L) 08/21/2022   HCT 37.1 08/21/2022   MCV 93.0 08/21/2022   MCH 29.8 08/21/2022   RDW 14.9 08/21/2022   PLT 169 [id number]   Last metabolic panel Lab Results  Component Value Date   GLUCOSE 109 (H) 08/21/2022   NA 140 08/21/2022   K 3.9 08/21/2022   CL 113 (H) 08/21/2022   CO2 22 08/21/2022   BUN 14 08/21/2022   CREATININE 1.03 (H) 08/21/2022   GFRNONAA 53 (L) 08/21/2022   CALCIUM 8.8 (L) 08/21/2022   PROT 7.0 08/20/2022   ALBUMIN 3.2 (L) 08/20/2022   LABGLOB 2.9 01/19/2015   AGRATIO 1.3 01/19/2015   BILITOT 0.5 08/20/2022   ALKPHOS 56 08/20/2022   AST 27 08/20/2022   ALT 20 08/20/2022   ANIONGAP 5 08/21/2022   Last lipids Lab Results  Component Value Date   CHOL 103 08/21/2022   HDL 41 08/21/2022   LDLCALC 50 08/21/2022   TRIG 58 08/21/2022  CHOLHDL 2.5 08/21/2022   Last hemoglobin A1c Lab Results  Component Value Date   HGBA1C 6.0 (H) 08/20/2022   Last thyroid functions Lab Results  Component Value Date   TSH 1.437 08/28/2015   Assessment & Plan:   Problem List Items Addressed This Visit       Essential hypertension - Primary    BP in office today is 144/71.  She  checked her blood pressure at home earlier today and reports a systolic reading of 0000000 mmHg.  She states that her home systolic BP readings are consistently in the 120s.  Her current antihypertensive regimen includes amlodipine 5 mg daily, losartan 50 mg daily, carvedilol 6.25 mg twice daily, and hydralazine 50 mg twice daily. -No medication changes today based on adequately controlled home readings.      CAD S/P PCI    CAD with DES to distal circumflex in March 2018.  She denies recent chest pain.  She remains on ASA, Plavix, and statin therapy.  Last seen by cardiology (Dr. Domenic Polite) on 05/11/22. -As noted above, recommend discontinuing ASA 81 mg and continuing Plavix monotherapy      Acute CVA (cerebrovascular accident) [facility])    December 2023.  She presented to the ED endorsing expressive dysphagia.  Imaging revealed multiple left hemispheric punctate ischemic infarcts, mainly in the left temporal lobe.  She was evaluated by neurology and discharged on DAPT x 21 days, followed by Plavix monotherapy.  She is also prescribed rosuvastatin 40 mg daily.  When reviewing medications today, she states that she continues to take ASA 81 mg daily. -I recommended that she discontinue aspirin and continue Plavix monotherapy as instructed upon hospital discharge in December. -Neurology follow-up as scheduled for 12/19/2022.      Obstructive sleep apnea    Known history of OSA.  She has previously refused CPAP.      GERD without esophagitis    Symptoms are currently well-controlled on Protonix 40 mg daily. -No medication changes today      Bilateral primary osteoarthritis of knee    Previously documented history of bilateral knee OA.  She has severe tricompartmental arthritis of the right knee.  She endorses significant pain today.  She has previously attended physical therapy, but has missed her recent sessions due to the death of her son.  She is interested in additional treatment options today. -I have  recommended that she resume physical therapy when she is able -She has previously received injections at [facility] in [place] and plans to contact their office to schedule a follow-up appointment.      CKD (chronic kidney disease), stage III ([facility])    CKD 3A.  She is followed by nephrology (Dr. Juleen China) in [place] and reports that she has follow-up scheduled for next month.  Labs stable when last checked in December 2023.      Hyperlipidemia with target LDL less than 70    Currently prescribed rosuvastatin 40 mg daily.  Lipid panel updated in December 2023.  Total cholesterol 103 and LDL 50. -No medication changes today      Encounter for general adult medical examination with abnormal findings    Presenting today to establish care. -Labs from December 2023 reviewed today. -We will request records from her previous PCP office and update outstanding preventative care items at her follow-up appointment in 3 months.       Return in about 3 months (around 01/30/2023).   Johnette Abraham, MD

## 2022-11-01 NOTE — Assessment & Plan Note (Signed)
Symptoms are currently well-controlled on Protonix 40 mg daily. -No medication changes today

## 2022-11-01 NOTE — Assessment & Plan Note (Signed)
Known history of OSA.  She has previously refused CPAP.

## 2022-11-01 NOTE — Assessment & Plan Note (Addendum)
Previously documented history of bilateral knee OA.  She has severe tricompartmental arthritis of the right knee.  She endorses significant pain today.  She has previously attended physical therapy, but has missed her recent sessions due to the death of her son.  She is interested in additional treatment options today. -I have recommended that she resume physical therapy when she is able -She has previously received injections at [facility] in [place] and plans to contact their office to schedule a follow-up appointment.

## 2022-11-01 NOTE — Assessment & Plan Note (Signed)
Currently prescribed rosuvastatin 40 mg daily.  Lipid panel updated in December 2023.  Total cholesterol 103 and LDL 50. -No medication changes today

## 2022-11-01 NOTE — Assessment & Plan Note (Signed)
BP in office today is 144/71.  She checked her blood pressure at home earlier today and reports a systolic reading of 0000000 mmHg.  She states that her home systolic BP readings are consistently in the 120s.  Her current antihypertensive regimen includes amlodipine 5 mg daily, losartan 50 mg daily, carvedilol 6.25 mg twice daily, and hydralazine 50 mg twice daily. -No medication changes today based on adequately controlled home readings.

## 2022-11-19 NOTE — Progress Notes (Signed)
Cardiology Office Note  Date: 11/21/2022   ID: HIYAB MALAVE, DOB 1934-03-10, MRN [id number]  History of Present Illness: [name] is an 89 y.o. female last seen in September 2023.  She is here today with her daughter for follow-up visit.  I reviewed interval records.  She was hospitalized with stroke in December 2023.  Neuroimaging was consistent with multiple left hemispheric punctate ischemic infarcts mainly in the left temporal lobe suggestive of embolic disease.  In-hospital cardiac monitoring did not demonstrate any atrial fibrillation.  She was discharged on dual antiplatelet therapy with to transition to Plavix alone long-term.  I reviewed her echocardiogram which is noted below.  She is in a wheelchair today, complaining of worsening arthritic symptoms.  No angina or recent nitroglycerin use.  We went over her medications.  She ran out of Coreg and has not taken any since January.  Also taking hydralazine 50 mg once a day.  Remains on stable doses of losartan and Norvasc otherwise.  She reports systolic blood pressure ranging from 120-140 at home.  Physical Exam: VS:  BP 104/61   Pulse 70   Ht '5\' 5"'$  (1.651 m)   Wt 185 lb (83.9 kg)   BMI 30.79 kg/m , BMI Body mass index is 30.79 kg/m.  Wt Readings from Last 3 Encounters:  11/21/22 185 lb (83.9 kg)  11/01/22 189 lb 6.4 oz (85.9 kg)  08/20/22 198 lb (89.8 kg)    General: Patient appears comfortable at rest. HEENT: Conjunctiva and lids normal. Neck: Supple, no elevated JVP or carotid bruits. Lungs: Clear to auscultation, nonlabored breathing at rest. Cardiac: Regular rate and rhythm, 2/6 systolic murmur. Extremities: Mild ankle edema.  ECG:  An ECG dated 08/20/2022 was personally reviewed today and demonstrated:  Sinus rhythm with right bundle branch block and left anterior fascicular block.  Labwork: 08/20/2022: ALT 20; AST 27 08/21/2022: BUN 14; Creatinine, Ser 1.03; Hemoglobin 11.9; Platelets 169; Potassium  3.9; Sodium 140     Component Value Date/Time   CHOL 103 08/21/2022 0431   CHOL 146 01/19/2015 1048   TRIG 58 08/21/2022 0431   HDL 41 08/21/2022 0431   HDL 48 01/19/2015 1048   CHOLHDL 2.5 08/21/2022 0431   VLDL 12 08/21/2022 0431   LDLCALC 50 08/21/2022 0431   LDLCALC 77 01/19/2015 1048   Other Studies Reviewed Today:  Echocardiogram 08/21/2022:  1. Left ventricular ejection fraction, by estimation, is 70 to 75%. The  left ventricle has hyperdynamic function. The left ventricle has no  regional wall motion abnormalities. There is severe concentric left  ventricular hypertrophy. Left ventricular  diastolic parameters are consistent with Grade I diastolic dysfunction  (impaired relaxation).   2. Right ventricular systolic function is normal. The right ventricular  size is normal. There is normal pulmonary artery systolic pressure. The  estimated right ventricular systolic pressure is 0000000 mmHg.   3. Left atrial size was mildly dilated.   4. The mitral valve is grossly normal. Mild mitral valve regurgitation.   5. The aortic valve is tricuspid. Aortic valve regurgitation is mild.  Aortic valve sclerosis is present, with no evidence of aortic valve  stenosis. Aortic valve mean gradient measures 5.0 mmHg.   6. Agitated saline contrast bubble study was negative, with no evidence  of right to left interatrial shunt.   Assessment and Plan:  1.  CAD status post stent intervention to the ramus intermedius and RCA in 2011 followed by NSTEMI in 2018 status post DES to  the distal circumflex.  LVEF 70 to 75% by recent follow-up echocardiogram.  She reports no active angina at this time.  Refill provided for fresh bottle of nitroglycerin.  Continue Plavix, Norvasc, losartan, and Crestor.  2.  Mixed hyperlipidemia, on Crestor.  Recent LDL 50 in December 2023.  No changes were made today.  3.  CKD stage IIIb, creatinine down to 1.03 as of December 2023.  4.  Essential hypertension.  Plan to  change hydralazine to 25 mg twice daily, stay off Coreg for now, continue Norvasc and losartan.  Continue to track blood pressure at home.  5.  History of stroke in December 99991111, likely embolic based on neuroimaging.  No atrial fibrillation documented during hospital monitoring.  She is on Plavix at this point.  Disposition:  Follow up  6 months.  Signed, Satira Sark, M.D., F.A.C.C.

## 2022-11-21 ENCOUNTER — Ambulatory Visit: Payer: Medicare HMO | Attending: Cardiology | Admitting: Cardiology

## 2022-11-21 ENCOUNTER — Encounter: Payer: Self-pay | Admitting: Cardiology

## 2022-11-21 VITALS — BP 104/61 | HR 70 | Ht 65.0 in | Wt 185.0 lb

## 2022-11-21 DIAGNOSIS — I10 Essential (primary) hypertension: Secondary | ICD-10-CM

## 2022-11-21 DIAGNOSIS — E782 Mixed hyperlipidemia: Secondary | ICD-10-CM | POA: Diagnosis not present

## 2022-11-21 DIAGNOSIS — I25119 Atherosclerotic heart disease of native coronary artery with unspecified angina pectoris: Secondary | ICD-10-CM

## 2022-11-21 DIAGNOSIS — N1832 Chronic kidney disease, stage 3b: Secondary | ICD-10-CM | POA: Diagnosis not present

## 2022-11-21 MED ORDER — NITROGLYCERIN 0.4 MG SL SUBL: SUBLINGUAL_TABLET | SUBLINGUAL | 3 refills | Status: DC

## 2022-11-21 MED ORDER — HYDRALAZINE HCL 25 MG PO TABS: 25.0000 mg | ORAL_TABLET | Freq: Two times a day (BID) | ORAL | 3 refills | Status: DC

## 2022-11-21 NOTE — Patient Instructions (Signed)
Medication Instructions:   INCREASE Hydralazine 25 mg to TWICE a day  Labwork: None today  Testing/Procedures: None today  Follow-Up: 6 months  Any Other Special Instructions Will Be Listed Below (If Applicable).  If you need a refill on your cardiac medications before your next appointment, please call your pharmacy.

## 2022-11-26 DIAGNOSIS — H401131 Primary open-angle glaucoma, bilateral, mild stage: Secondary | ICD-10-CM | POA: Diagnosis not present

## 2022-12-03 ENCOUNTER — Encounter: Payer: Self-pay | Admitting: Orthopedic Surgery

## 2022-12-03 ENCOUNTER — Ambulatory Visit (INDEPENDENT_AMBULATORY_CARE_PROVIDER_SITE_OTHER): Payer: Medicare HMO | Admitting: Orthopedic Surgery

## 2022-12-03 ENCOUNTER — Other Ambulatory Visit (INDEPENDENT_AMBULATORY_CARE_PROVIDER_SITE_OTHER): Payer: Medicare HMO

## 2022-12-03 DIAGNOSIS — M25561 Pain in right knee: Secondary | ICD-10-CM

## 2022-12-03 DIAGNOSIS — M25562 Pain in left knee: Secondary | ICD-10-CM | POA: Diagnosis not present

## 2022-12-03 DIAGNOSIS — G8929 Other chronic pain: Secondary | ICD-10-CM

## 2022-12-03 DIAGNOSIS — M170 Bilateral primary osteoarthritis of knee: Secondary | ICD-10-CM

## 2022-12-03 NOTE — Progress Notes (Signed)
Office Visit Note   Patient: [name]           Date of Birth: 11-05-1933           MRN: [id number] Visit Date: 12/03/2022              Requested by: Johnette Abraham, MD [address] [place],  [place] [address] PCP: Johnette Abraham, MD  Chief Complaint  Patient presents with   Right Knee - Pain   Left Knee - Pain      HPI: Patient is an 89 year old woman who presents with chronic arthritis of both knees.  Patient states that she does have heart disease and has been told that she is not safe to proceed with major surgery.  Assessment & Plan: Visit Diagnoses:  1. Chronic pain of both knees   2. Bilateral primary osteoarthritis of knee     Plan: Both knees were injected she tolerated this well we will reevaluate in the office as needed.  Discussed that we could proceed with steroid injections as needed.  Follow-Up Instructions: No follow-ups on file.   Ortho Exam  Patient is alert, oriented, no adenopathy, well-dressed, normal affect, normal respiratory effort. Examination patient is ambulating in a wheelchair.  She has crepitation with range of motion of both knees with varus alignment.  There is no effusion no cellulitis.  There is global tenderness to palpation.  Imaging: XR Knee 1-2 Views Left  Result Date: 12/03/2022 2 view radiographs of the left  knee shows varus alignment with bone-on-bone contact medial and lateral joint line with periarticular bony spurs in all 3 compartments subcondylar sclerosis and cyst and large loose bodies.  XR Knee 1-2 Views Right  Result Date: 12/03/2022 2 view radiographs of the right knee shows varus alignment with bone-on-bone contact medial and lateral joint line with periarticular bony spurs in all 3 compartments subcondylar sclerosis and cyst and large loose bodies.  No images are attached to the encounter.  Labs: Lab Results  Component Value Date   HGBA1C 6.0 (H) 08/20/2022   HGBA1C 5.4 11/06/2021   HGBA1C 6.0  (H) 03/02/2014   REPTSTATUS 09/27/2018 FINAL 09/26/2018   CULT  09/26/2018    NO GROWTH Performed at [facility], [place] [address] [place], [place] [address]    LABORGA ESCHERICHIA COLI 12/01/2011   LABORGA KLEBSIELLA PNEUMONIAE 12/01/2011     Lab Results  Component Value Date   ALBUMIN 3.2 (L) 08/20/2022   ALBUMIN 3.1 (L) 11/05/2021   ALBUMIN 3.5 05/14/2019    No results found for: "MG" No results found for: "VD25OH"  No results found for: "PREALBUMIN"    Latest Ref Rng & Units 08/21/2022    4:31 AM 08/20/2022    6:59 PM 11/26/2021    1:08 PM  CBC EXTENDED  WBC 4.0 - 10.5 K/uL 5.8  6.0    RBC 3.87 - 5.11 MIL/uL 3.99  4.15    Hemoglobin 12.0 - 15.0 g/dL 11.9  12.3  15.3   HCT 36.0 - 46.0 % 37.1  38.3  45.0   Platelets 150 - 400 K/uL 169  193    NEUT# 1.7 - 7.7 K/uL  4.0    Lymph# 0.7 - 4.0 K/uL  1.2       There is no height or weight on file to calculate BMI.  Orders:  Orders Placed This Encounter  Procedures   XR Knee 1-2 Views Left   XR Knee 1-2  Views Right   No orders of the defined types were placed in this encounter.    Procedures: Large Joint Inj: bilateral knee on 12/03/2022 5:06 PM Indications: pain and diagnostic evaluation Details: 22 G 1.5 in needle, anteromedial approach  Arthrogram: No  Outcome: tolerated well, no immediate complications Procedure, treatment alternatives, risks and benefits explained, specific risks discussed. Consent was given by the patient. Immediately prior to procedure a time out was called to verify the correct patient, procedure, equipment, support staff and site/side marked as required. Patient was prepped and draped in the usual sterile fashion.      Clinical Data: No additional findings.  ROS:  All other systems negative, except as noted in the HPI. Review of Systems  Objective: Vital Signs: There were no vitals taken for this visit.  Specialty Comments:  EXAM: MRI LUMBAR SPINE WITHOUT CONTRAST    TECHNIQUE: Multiplanar, multisequence MR imaging of the lumbar spine was performed. No intravenous contrast was administered.   COMPARISON:  Lumbar spine radiographs 02/13/2022, CT abdomen/pelvis 09/26/2018   FINDINGS: Segmentation: Standard; the lowest formed disc space is designated L5-S1.   Alignment: There is grade 1 anterolisthesis of L3 on L4 and L4 on L5 and trace retrolisthesis of L5 on S1.   Vertebrae: Vertebral body heights are preserved. Background marrow signal is normal. There is a prominent intraosseous hemangioma in the T11 vertebral body, unchanged since the prior CT from 2020. There are scattered small Schmorl's nodes without associated edema. There is no suspicious marrow signal abnormality or marrow edema.   Conus medullaris and cauda equina: Conus extends to the L1 level. Conus and cauda equina appear normal.   Paraspinal and other soft tissues: T2 hyperintense lesions in the kidneys most likely reflect benign cysts for which no specific imaging follow-up is required. The paraspinal soft tissues are unremarkable.   Disc levels:   There is advanced disc desiccation and narrowing at L5-S1. There is mild disc degeneration at the other levels.   T12-L1: There is a mild disc bulge and mild bilateral facet arthropathy resulting in mild left worse than right neural foraminal stenosis without significant spinal canal stenosis   L1-L2: There is a mild disc bulge and mild facet arthropathy without significant spinal canal or neural foraminal stenosis   L2-L3: There is a mild disc bulge eccentric to the left and mild bilateral facet arthropathy without significant spinal canal or neural foraminal stenosis   L3-L4: There is grade 1 anterolisthesis with a diffuse disc bulge and advanced bilateral facet arthropathy with ligamentum flavum thickening resulting in severe spinal canal stenosis with cauda equina nerve root compression and moderate right and mild  left neural foraminal stenosis   L4-L5: There is grade 1 anterolisthesis with a disc bulge eccentric to the right and advanced bilateral facet arthropathy with ligamentum flavum thickening resulting in severe spinal canal stenosis with cauda equina nerve root compression and severe right worse than left neural foraminal stenosis   L5-S1: There is advanced disc desiccation and narrowing with diffuse disc bulge and moderate bilateral facet arthropathy with trace effusions resulting in effacement of the bilateral subarticular zones (left worse than right) with potential impingement of either traversing S1 nerve root and moderate bilateral neural foraminal stenosis.   IMPRESSION: 1. Grade 1 anterolisthesis of L3 on L4 and L4 on L5 with associated disc bulges and advanced facet arthropathy resulting in severe spinal canal stenosis at both levels, and moderate right and mild left neural foraminal stenosis at L3-L4 and severe right  worse than left neural foraminal stenosis at L4-L5. 2. Advanced disc degeneration with a bulge and bilateral facet arthropathy at L5-S1 resulting in effacement of the subarticular zones, left worse than right, with potential impingement of either traversing S1 nerve root and moderate bilateral neural foraminal stenosis.     Electronically Signed   By: Valetta Mole M.D.   On: 04/24/2022 08:48  PMFS History: Patient Active Problem List   Diagnosis Date Noted   Bilateral primary osteoarthritis of knee 11/01/2022   Encounter for general adult medical examination with abnormal findings 11/01/2022   Chronic kidney disease, stage III (moderate) ([place]) 08/21/2022   Acute CVA (cerebrovascular accident) ([place]) 11/06/2021   Dyslipidemia 11/06/2021   GERD without esophagitis 11/06/2021   CKD (chronic kidney disease), stage III ([place]) 11/23/2016   Pericardial effusion 11/23/2016   Complete heart block ([place]) 11/16/2016   Non-ST elevation (NSTEMI) myocardial infarction  ([place]) 11/16/2016   Renal insufficiency 11/03/2015   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 09/06/2015   Uncontrolled hypertension 08/31/2015   HTN (hypertension), malignant    Symptomatic bradycardia    Hyperlipidemia    Epigastric fullness    Near syncope 08/28/2015   CAD S/P PCI    Essential hypertension 03/05/2013   Class 1 obesity 03/05/2013   Hyperlipidemia with target LDL less than 70 03/05/2013   Obstructive sleep apnea 03/05/2013   Past Medical History:  Diagnosis Date   Arthritis    CKD (chronic kidney disease), stage III ([place])    Complete heart block ([place]) 11/16/2016   a. transient during NSTEMI, resolved with revascularization.   Coronary artery disease 11/2009   a. with prior stent placement to the ramus intermedius and RCA. b. NSTEMI 11/2016 s/p DES to DES to distal Cx with moderate residual dz.   CVA (cerebral vascular accident) [facility])    Essential hypertension    Mixed hyperlipidemia    Non-ST elevation (NSTEMI) myocardial infarction ([place]) 11/16/2016   Obesity    Osteoarthritis    Reflux esophagitis    Sleep apnea 09/27/2010   Untreated, REM 64.7/hr AHI 18.5/hr RDI 19.2/hr. Patuent refused CPAP therapy    Family History  Problem Relation Age of Onset   Hypertension Mother    Aneurysm Mother        died at age 2   Other Father        unsure of medical history   Hypertension Other     Past Surgical History:  Procedure Laterality Date   CHOLECYSTECTOMY     CORONARY ANGIOPLASTY WITH STENT PLACEMENT  2007   A 3.0x63mm CYPHER stent post dilated to 3.29 mm the 100% occlusion was reduced to 0%   CORONARY STENT INTERVENTION N/A 11/16/2016   Procedure: Coronary Stent Intervention;  Surgeon: Sherren Mocha, MD;  Location: [place] [facility];  Service: Cardiovascular;  Laterality: N/A;   LEFT HEART CATH AND CORONARY ANGIOGRAPHY N/A 11/16/2016   Procedure: Left Heart Cath and Coronary Angiography;  Surgeon: Sherren Mocha, MD;  Location: [place] [facility];  Service: Cardiovascular;  Laterality: N/A;   LEFT HEART CATHETERIZATION WITH CORONARY ANGIOGRAM N/A 07/12/2014   Procedure: LEFT HEART CATHETERIZATION WITH CORONARY ANGIOGRAM;  Surgeon: Blane Ohara, MD;  Location: [facility] CATH LAB;  Service: Cardiovascular;  Laterality: N/A;   TEMPORARY PACEMAKER N/A 11/16/2016   Procedure: Temporary Pacemaker;  Surgeon: Sherren Mocha, MD;  Location: [place] [facility];  Service: Cardiovascular;  Laterality: N/A;   Social History   Occupational History  Occupation: Retired  Tobacco Use   Smoking status: Never   Smokeless tobacco: Never  Vaping Use   Vaping Use: Never used  Substance and Sexual Activity   Alcohol use: No   Drug use: No   Sexual activity: Never

## 2022-12-13 ENCOUNTER — Other Ambulatory Visit: Payer: Self-pay | Admitting: Cardiology

## 2022-12-13 ENCOUNTER — Telehealth: Payer: Self-pay | Admitting: Internal Medicine

## 2022-12-13 NOTE — Telephone Encounter (Signed)
Called patient to schedule an AWV , but per patient daughter had AWV 01.2024 at [facility] before they closed. Does not want to schedule due to charges.

## 2022-12-13 NOTE — Telephone Encounter (Signed)
*  STAT* If patient is at the pharmacy, call can be transferred to refill team.   1. Which medications need to be refilled? (please list name of each medication and dose if known)  new prescription for Clopidogrel  2. Which pharmacy/location (including street and city if local pharmacy) is medication to be sent to?  [facility] [address] [place]   3. Do they need a 30 day or 90 day supply? 90 days and refills- please call in today if possible please

## 2022-12-14 ENCOUNTER — Other Ambulatory Visit: Payer: Self-pay | Admitting: Cardiology

## 2022-12-19 ENCOUNTER — Encounter: Payer: Self-pay | Admitting: Neurology

## 2022-12-19 ENCOUNTER — Ambulatory Visit (INDEPENDENT_AMBULATORY_CARE_PROVIDER_SITE_OTHER): Payer: Medicare HMO | Admitting: Neurology

## 2022-12-19 VITALS — BP 154/74 | HR 77 | Ht 65.0 in | Wt 187.5 lb

## 2022-12-19 DIAGNOSIS — I639 Cerebral infarction, unspecified: Secondary | ICD-10-CM

## 2022-12-19 DIAGNOSIS — N2581 Secondary hyperparathyroidism of renal origin: Secondary | ICD-10-CM | POA: Diagnosis not present

## 2022-12-19 DIAGNOSIS — N1831 Chronic kidney disease, stage 3a: Secondary | ICD-10-CM | POA: Diagnosis not present

## 2022-12-19 DIAGNOSIS — E785 Hyperlipidemia, unspecified: Secondary | ICD-10-CM

## 2022-12-19 DIAGNOSIS — I10 Essential (primary) hypertension: Secondary | ICD-10-CM

## 2022-12-19 DIAGNOSIS — I5032 Chronic diastolic (congestive) heart failure: Secondary | ICD-10-CM | POA: Diagnosis not present

## 2022-12-19 DIAGNOSIS — I129 Hypertensive chronic kidney disease with stage 1 through stage 4 chronic kidney disease, or unspecified chronic kidney disease: Secondary | ICD-10-CM | POA: Diagnosis not present

## 2022-12-19 DIAGNOSIS — N1832 Chronic kidney disease, stage 3b: Secondary | ICD-10-CM | POA: Diagnosis not present

## 2022-12-19 DIAGNOSIS — R93421 Abnormal radiologic findings on diagnostic imaging of right kidney: Secondary | ICD-10-CM | POA: Diagnosis not present

## 2022-12-19 DIAGNOSIS — D638 Anemia in other chronic diseases classified elsewhere: Secondary | ICD-10-CM | POA: Diagnosis not present

## 2022-12-19 DIAGNOSIS — I63412 Cerebral infarction due to embolism of left middle cerebral artery: Secondary | ICD-10-CM

## 2022-12-19 DIAGNOSIS — R809 Proteinuria, unspecified: Secondary | ICD-10-CM | POA: Diagnosis not present

## 2022-12-19 DIAGNOSIS — D508 Other iron deficiency anemias: Secondary | ICD-10-CM | POA: Diagnosis not present

## 2022-12-19 DIAGNOSIS — E559 Vitamin D deficiency, unspecified: Secondary | ICD-10-CM | POA: Diagnosis not present

## 2022-12-19 NOTE — Progress Notes (Signed)
[facility]  PATIENT: [name] DOB: 1934-01-27  REQUESTING CLINICIAN: Shawnie Dapper, PA-C HISTORY FROM: Patient and daughter  REASON FOR VISIT: Stroke follow up   HISTORICAL  CHIEF COMPLAINT:  Chief Complaint  Patient presents with   Follow-up    Rm 13, daughter present Left pontine cerebrovascular accident. Had 2nd stroke 12/13/2/3.       INTERVAL HISTORY 12/19/2022:  Patient presents today for follow-up, she is accompanied by her daughter.  Since last visit she has been doing well until December 2023 when she was admitted to the hospital for slurred speech and dehydration and was found to have multiple stroke, etiology likely embolic.  Per daughter patient was complaining of slurred speech for about 5 days.  Initially was thought to be dehydration, she increased her fluid intake but symptoms did not resolved.  In the ED her MRI was completed and showed multiple punctate strokes within the left hemisphere concerning for embolic etiology.  She completed DAPT aspirin and Plavix for 21 days and currently is on Plavix alone.  She was recommended cardiac monitoring to rule out atrial fibrillation but this has not been completed.  From her previous stroke in February 2022 she did have a 30-day cardiac monitor which was unrevealing.  Currently she does not have any deficit.  She feels she is back to her normal self.   Hospital Course and Summary  As per H&P written by Dr. Arville Care on 08/20/2022 [name] is a 89 y.o. African-American female with medical history significant for osteoarthritis, CVA, hypertension, dyslipidemia, coronary artery disease, GERD, OSA and obesity, who presented to the emergency room with acute onset of expressive dysphasia that started about 5 days ago.  The patient lives at home alone and has been independent.  Her daughters live close to her and frequently check on her.  Her family noticed that last week she was having difficulty  finding words and her symptoms persisted.  She has been having frontal headache over the last couple of days for which she has been taking Tylenol with improvement.  She was recently seen in urgent care and treated for URI.  This was before her symptoms started.  She had a CVA earlier this year without residual deficits.  No unilateral muscle weakness or paresthesias.  No gait abnormalities.  No visual loss, blurred vision or diplopia, tinnitus or vertigo.  No fever or chills.  No chest pain or palpitations.  No cough or wheezing.  No nausea or vomiting or abdominal pain.  No dysuria, oliguria or hematuria or flank pain.   Brain MRI with MRA of the head without contrast revealed the following: 1. Multifocal punctate acute ischemia in the left hemisphere, within multiple vascular territories. The greatest number are located in the medial left temporal lobe. Distribution is most consistent with embolic disease from the left carotid or distal aortic arch. 2. Normal intracranial MRA.   Dr. Wilford Corner was contacted about the patient and recommended admission here.  Teleneurology consult will be obtained in a.m.  The patient will be admitted to a medical telemetry bed for further evaluation and management.   Assessment and Plan: * Acute CVA (cerebrovascular accident) (HCC) - The patient has multiple left hemispheric punctate ischemic infarcts mainly in the left temporal lobe concerning for embolic disease. -Case discussed with neurology service who recommended dual antiplatelet therapy for 3 weeks with transition to monotherapy using Plavix. -Continue Crestor for LDL goal less than 70 -Continue risk factor modifications including  blood pressure control towards normotension and loop recorder placement by cardiology as an outpatient. -Physical therapy has assessed patient with recommendations for outpatient PT.   HISTORY OF PRESENT ILLNESS:  This is a 89 year old woman with past medical history hypertension,  hyperlipidemia, GERD, obstructive sleep apnea, CKD, coronary artery disease status post 2 stents who is presenting to clinic after being admitted to the hospital for right lower leg weakness and found to have a left pontine stroke.  Stroke etiology likely small vessel disease but cannot rule out large vessel disease.  She was started on aspirin and Plavix for total of 21 days and plan was to continue with aspirin thereafter.  Since leaving the hospital, she has declined PT, OT because she feels back to her normal strength.  She is currently wearing a cardiac monitor for total of 1 month. A month later after initial admission for stroke, she presented to the hospital for right thigh numbness and was diagnosed with meralgia paresthetica. Hospital course below    Hospital summary and course below "[name] is a 89 y.o. African-American female with medical history significant for coronary artery disease status post PCI and stent, hypertension, dyslipidemia, GERD, OSA, CKD stage IIIb and osteoarthritis, who presented to the emergency room with acute onset of right foot numbness on her bed around midnight but was able to get off of her bed.  She went to sleep at 10 PM.  When she woke up at 6 AM she felt numbness in her right face as well as right arm, right leg and foot.  Her symptoms have been improving some since 6 AM but not resolved.  She denies any muscle weakness, dysarthria or dysphagia.  No tinnitus or vertigo.  She denied any ataxia and no diplopia.  No urinary or stool incontinence.  No chest pain or palpitations.  She has chronic neck and right shoulder pain.  No fever or chills.  She was seen in the ER for neck and shoulder pain on Saturday 2/18 and placed on steroids and muscle relaxants.  She was also seen on 2/16 for elevated BP that was managed.  ED Course: When she came to the ER, blood pressure was 216/89 with heart rate of 57 with otherwise normal vital signs.  BP later on was 141/69.  Labs  revealed a creatinine of 1.2 with otherwise unremarkable CMP.  CBC was within normal. EKG as reviewed by me : Showed normal sinus rhythm with rate of 80 with right bundle branch block and left intrafascicular block (bifascicular block). Imaging: Noncontrast CT scan showed no acute intracranial normalities.  It showed atrophy and chronic microvascular disease.  Brain MRI without contrast revealed small acute infarction of the dorsal left pons with no hemorrhage or mass effect.  It showed findings of chronic small vessel ischemic disease and generalized volume loss.  The patient was given 1 mg of IV Ativan.  She will be admitted to a medical telemetry bed for further evaluation and management."   Patient was seen by neurology for stroke work-up including MRA, carotid Doppler, echocardiogram, lipid panel, A1c.Marland Kitchen She was started on dual antiplatelet therapy aspirin and Plavix for 3 weeks then to continue aspirin only.  She was seen by PT OT and recommended for home health therapy.  Message sent to cardiology to arrange for 30-day event monitor for A-fib on discharge.  Follow-up with neurology."    OTHER MEDICAL CONDITIONS: Hypertension, hyperlipidemia, CAD status post stent, obstructive sleep apnea on CPAP, CKD  REVIEW OF SYSTEMS: Full 14 system review of systems performed and negative with exception of: As noted in the HPI  ALLERGIES: Allergies  Allergen Reactions   Atorvastatin Nausea And Vomiting   Contrast Media [Iodinated Contrast Media] Other (See Comments)    Nausea/Vomiting and Shortness of Breath   Darvon [Propoxyphene] Nausea And Vomiting   Codeine Nausea And Vomiting and Anxiety    HOME MEDICATIONS: Outpatient Medications Prior to Visit  Medication Sig Dispense Refill   acetaminophen (TYLENOL) 500 MG tablet Take 1,000 mg by mouth every 6 (six) hours as needed for mild pain or moderate pain.     amLODipine (NORVASC) 5 MG tablet TAKE 1 TABLET BY MOUTH EVERY DAY 90 tablet 3    clopidogrel (PLAVIX) 75 MG tablet TAKE 1 TABLET BY MOUTH EVERY DAY 90 tablet 1   fluticasone (FLONASE) 50 MCG/ACT nasal spray Place 2 sprays into both nostrils daily. (Patient taking differently: Place 2 sprays into both nostrils daily as needed.) 16 g 0   hydrALAZINE (APRESOLINE) 25 MG tablet Take 1 tablet (25 mg total) by mouth in the morning and at bedtime. 180 tablet 3   losartan (COZAAR) 50 MG tablet TAKE 1 TABLET BY MOUTH EVERY DAY 90 tablet 3   Menthol, Topical Analgesic, (BIOFREEZE ROLL-ON) 4 % GEL Apply 1 application. topically daily as needed (pain).     nitroGLYCERIN (NITROSTAT) 0.4 MG SL tablet PLACE 1 TABLET UNDER THE TONGUE EVERY 5 MINUTES AS NEEDED. 25 tablet 3   pantoprazole (PROTONIX) 40 MG tablet TAKE 1 TABLET BY MOUTH EVERY DAY 90 tablet 1   rosuvastatin (CRESTOR) 40 MG tablet TAKE 1 TABLET BY MOUTH EVERY DAY 90 tablet 1   benzonatate (TESSALON PERLES) 100 MG capsule Take 1 capsule (100 mg total) by mouth 3 (three) times daily as needed for cough. 30 capsule 0   No facility-administered medications prior to visit.    PAST MEDICAL HISTORY: Past Medical History:  Diagnosis Date   Arthritis    CKD (chronic kidney disease), stage III    Complete heart block 11/16/2016   a. transient during NSTEMI, resolved with revascularization.   Coronary artery disease 11/2009   a. with prior stent placement to the ramus intermedius and RCA. b. NSTEMI 11/2016 s/p DES to DES to distal Cx with moderate residual dz.   CVA (cerebral vascular accident)    Essential hypertension    Mixed hyperlipidemia    Non-ST elevation (NSTEMI) myocardial infarction 11/16/2016   Obesity    Osteoarthritis    Reflux esophagitis    Sleep apnea 09/27/2010   Untreated, REM 64.7/hr AHI 18.5/hr RDI 19.2/hr. Patuent refused CPAP therapy    PAST SURGICAL HISTORY: Past Surgical History:  Procedure Laterality Date   CHOLECYSTECTOMY     CORONARY ANGIOPLASTY WITH STENT PLACEMENT  2007   A 3.0x44mm CYPHER stent  post dilated to 3.29 mm the 100% occlusion was reduced to 0%   CORONARY STENT INTERVENTION N/A 11/16/2016   Procedure: Coronary Stent Intervention;  Surgeon: Tonny Bollman, MD;  Location: [facility] INVASIVE CV LAB;  Service: Cardiovascular;  Laterality: N/A;   LEFT HEART CATH AND CORONARY ANGIOGRAPHY N/A 11/16/2016   Procedure: Left Heart Cath and Coronary Angiography;  Surgeon: Tonny Bollman, MD;  Location: [facility] INVASIVE CV LAB;  Service: Cardiovascular;  Laterality: N/A;   LEFT HEART CATHETERIZATION WITH CORONARY ANGIOGRAM N/A 07/12/2014   Procedure: LEFT HEART CATHETERIZATION WITH CORONARY ANGIOGRAM;  Surgeon: Micheline Chapman, MD;  Location: [facility] CATH LAB;  Service: Cardiovascular;  Laterality: N/A;  TEMPORARY PACEMAKER N/A 11/16/2016   Procedure: Temporary Pacemaker;  Surgeon: Tonny Bollman, MD;  Location: [facility] INVASIVE CV LAB;  Service: Cardiovascular;  Laterality: N/A;    FAMILY HISTORY: Family History  Problem Relation Age of Onset   Hypertension Mother    Aneurysm Mother        died at age 23   Other Father        unsure of medical history   Hypertension Other     SOCIAL HISTORY: Social History   Socioeconomic History   Marital status: Single    Spouse name: Not on file   Number of children: 9   Years of education: 10th grade   Highest education level: Not on file  Occupational History   Occupation: Retired  Tobacco Use   Smoking status: Never   Smokeless tobacco: Never  Vaping Use   Vaping Use: Never used  Substance and Sexual Activity   Alcohol use: No   Drug use: No   Sexual activity: Never  Other Topics Concern   Not on file  Social History Narrative   Lives alone (children/grandchildren come by daily - sometimes spend the night).   Right-handed.   Nine children (8 living).   Social Determinants of Health   Financial Resource Strain: Not on file  Food Insecurity: No Food Insecurity (08/21/2022)   Hunger Vital Sign    Worried About Running Out of Food in the Last  Year: Never true    Ran Out of Food in the Last Year: Never true  Transportation Needs: No Transportation Needs (08/21/2022)   PRAPARE - Administrator, Civil Service (Medical): No    Lack of Transportation (Non-Medical): No  Physical Activity: Not on file  Stress: Not on file  Social Connections: Not on file  Intimate Partner Violence: Not At Risk (08/21/2022)   Humiliation, Afraid, Rape, and Kick questionnaire    Fear of Current or Ex-Partner: No    Emotionally Abused: No    Physically Abused: No    Sexually Abused: No    PHYSICAL EXAM  GENERAL EXAM/CONSTITUTIONAL: Vitals:  Vitals:   12/19/22 1120  BP: (!) 154/74  Pulse: 77  Weight: 187 lb 8 oz (85 kg)  Height: 5\' 5"  (1.651 m)   Body mass index is 31.2 kg/m. Wt Readings from Last 3 Encounters:  12/19/22 187 lb 8 oz (85 kg)  11/21/22 185 lb (83.9 kg)  11/01/22 189 lb 6.4 oz (85.9 kg)   Patient is in no distress; well developed, nourished and groomed; neck is supple  EYES: Visual fields full to confrontation, Extraocular movements intacts,   MUSCULOSKELETAL: Gait, strength, tone, movements noted in Neurologic exam below  NEUROLOGIC: MENTAL STATUS:      No data to display         awake, alert, oriented to person, place and time recent and remote memory intact normal attention and concentration language fluent, comprehension intact, naming intact fund of knowledge appropriate  CRANIAL NERVE:  2nd, 3rd, 4th, 6th - visual fields full to confrontation, extraocular muscles intact, no nystagmus 5th - facial sensation symmetric 7th - facial strength symmetric 8th - hearing intact 9th - palate elevates symmetrically, uvula midline 11th - shoulder shrug symmetric 12th - tongue protrusion midline  MOTOR:  normal bulk and tone, full strength in the BUE, BLE  SENSORY:  normal and symmetric to light touch  COORDINATION:  finger-nose-finger, fine finger movements normal  REFLEXES:  deep tendon  reflexes present and symmetric  GAIT/STATION:  normal   DIAGNOSTIC DATA (LABS, IMAGING, TESTING) - I reviewed patient records, labs, notes, testing and imaging myself where available.  Lab Results  Component Value Date   WBC 5.8 08/21/2022   HGB 11.9 (L) 08/21/2022   HCT 37.1 08/21/2022   MCV 93.0 08/21/2022   PLT 169 08/21/2022      Component Value Date/Time   NA 140 08/21/2022 0431   NA 139 01/19/2015 1048   K 3.9 08/21/2022 0431   CL 113 (H) 08/21/2022 0431   CO2 22 08/21/2022 0431   GLUCOSE 109 (H) 08/21/2022 0431   BUN 14 08/21/2022 0431   BUN 14 01/19/2015 1048   CREATININE 1.03 (H) 08/21/2022 0431   CREATININE 0.96 03/02/2014 1057   CALCIUM 8.8 (L) 08/21/2022 0431   PROT 7.0 08/20/2022 1859   PROT 6.7 01/19/2015 1048   ALBUMIN 3.2 (L) 08/20/2022 1859   ALBUMIN 3.8 01/19/2015 1048   AST 27 08/20/2022 1859   ALT 20 08/20/2022 1859   ALKPHOS 56 08/20/2022 1859   BILITOT 0.5 08/20/2022 1859   BILITOT 0.3 01/19/2015 1048   GFRNONAA 53 (L) 08/21/2022 0431   GFRAA 37 (L) 06/05/2020 2049   Lab Results  Component Value Date   CHOL 103 08/21/2022   HDL 41 08/21/2022   LDLCALC 50 08/21/2022   TRIG 58 08/21/2022   CHOLHDL 2.5 08/21/2022   Lab Results  Component Value Date   HGBA1C 6.0 (H) 08/20/2022   No results found for: "VITAMINB12" Lab Results  Component Value Date   TSH 1.437 08/28/2015    MRI 11/26/2021: 1. Decreased conspicuity of a subacute left pontine infarct. 2. No evidence of interval/new acute abnormality. 3. Chronic microvascular ischemic disease and atrophy.   MRA head 11/26/2021 : No large vessel occlusion or proximal hemodynamically significant stenosis.   MRA neck 11/26/2021: Unfortunately, the aorta, great vessel origins and proximal common carotid arteries and vertebral arteries were not imaged. Within this limitation and patient motion, the visible common carotid arteries, internal carotid arteries and vertebral arteries are patent  without significant (greater than 50%) stenosis  MRI/MRA 08/20/2022 1. Multifocal punctate acute ischemia in the left hemisphere, within multiple vascular territories. The greatest number are located in the medial left temporal lobe. Distribution is most consistent with embolic disease from the left carotid or distal aortic arch. 2. Normal intracranial MRA   ASSESSMENT AND PLAN 89 y.o. year old female with vascular risk factors including hypertension, hyperlipidemia, coronary artery disease status post 3 stents who is presenting following an admission for slurred speech and found to have multiple punctate strokes within the left hemisphere.  Stroke etiology likely cardioembolic.  She completed DAPT for 21 days and currently on Plavix alone.  I will contact her cardiologist to arrange for loop recorder placement.  This was explained to the patient and she is comfortable with plans.  I will contact her with the result of the loop recorder if there is presence of atrial fibrillation, patient would likely be anticoagulated.  I will see her in the office in 6 months or sooner if worse.   1. Hypertension, unspecified type   2. Left pontine cerebrovascular accident   3. Hyperlipidemia, unspecified hyperlipidemia type   4. Cerebrovascular accident (CVA) due to embolism of left middle cerebral artery      Patient Instructions  Continue current medications I will contact patient cardiologist Dr. Diona BrownerMcDowell to arrange for loop recorder placement Continue to follow with PCP Return in 6 months or sooner if worse.  No orders of the defined types were placed in this encounter.   No orders of the defined types were placed in this encounter.   Return in about 6 months (around 06/20/2023).   I have spent a total of 50 minutes dedicated to this patient today, preparing to see patient, performing a medically appropriate examination and evaluation, ordering tests and/or medications and procedures, and  counseling and educating the patient/family/caregiver; independently interpreting result and communicating results to the family/patient/caregiver; and documenting clinical information in the electronic medical record.   Windell Norfolk, MD 12/20/2022, 4:58 PM  [facility] [address] [place], [place] [address] [phone]

## 2022-12-20 ENCOUNTER — Encounter: Payer: Self-pay | Admitting: Neurology

## 2022-12-20 NOTE — Patient Instructions (Addendum)
Continue current medications I will contact patient cardiologist Dr. Diona Browner to arrange for loop recorder placement Continue to follow with PCP Return in 6 months or sooner if worse.

## 2022-12-21 ENCOUNTER — Other Ambulatory Visit: Payer: Self-pay | Admitting: *Deleted

## 2022-12-21 ENCOUNTER — Other Ambulatory Visit: Payer: Self-pay | Admitting: Cardiology

## 2022-12-21 DIAGNOSIS — I639 Cerebral infarction, unspecified: Secondary | ICD-10-CM

## 2022-12-25 DIAGNOSIS — N2581 Secondary hyperparathyroidism of renal origin: Secondary | ICD-10-CM | POA: Diagnosis not present

## 2022-12-25 DIAGNOSIS — N1832 Chronic kidney disease, stage 3b: Secondary | ICD-10-CM | POA: Diagnosis not present

## 2022-12-25 DIAGNOSIS — I129 Hypertensive chronic kidney disease with stage 1 through stage 4 chronic kidney disease, or unspecified chronic kidney disease: Secondary | ICD-10-CM | POA: Diagnosis not present

## 2022-12-25 DIAGNOSIS — R809 Proteinuria, unspecified: Secondary | ICD-10-CM | POA: Diagnosis not present

## 2022-12-31 ENCOUNTER — Ambulatory Visit (INDEPENDENT_AMBULATORY_CARE_PROVIDER_SITE_OTHER): Payer: Medicare HMO | Admitting: Orthopedic Surgery

## 2022-12-31 ENCOUNTER — Encounter: Payer: Self-pay | Admitting: Orthopedic Surgery

## 2022-12-31 DIAGNOSIS — M25561 Pain in right knee: Secondary | ICD-10-CM

## 2022-12-31 DIAGNOSIS — G8929 Other chronic pain: Secondary | ICD-10-CM

## 2022-12-31 DIAGNOSIS — M48062 Spinal stenosis, lumbar region with neurogenic claudication: Secondary | ICD-10-CM

## 2022-12-31 DIAGNOSIS — M25562 Pain in left knee: Secondary | ICD-10-CM

## 2022-12-31 MED ORDER — PREDNISONE 10 MG PO TABS: 10.0000 mg | ORAL_TABLET | Freq: Every day | ORAL | 0 refills | Status: DC

## 2022-12-31 NOTE — Progress Notes (Signed)
Office Visit Note   Patient: [name]           Date of Birth: 02-Oct-1933           MRN: [id number] Visit Date: 12/31/2022              Requested by: [name], MD [address] [place],  [place] [address] PCP: [name], MD  Chief Complaint  Patient presents with   Left Knee - Pain    S/p injection bilateral knee 12/03/2022   Right Knee - Pain      HPI: Patient is an 89 year old woman who presents in follow-up for bilateral knee pain status post injections 4 weeks ago.  She states the injection worked well on the left knee but still has pain globally around the right knee as well as over the lateral iliotibial band on the right.  Assessment & Plan: Visit Diagnoses:  1. Chronic pain of both knees   2. Spinal stenosis of lumbar region with neurogenic claudication     Plan: Will try low-dose prednisone 10 mg with breakfast.  Reevaluate in 4 weeks.  The right knee pain may have a component of radicular pain from her spinal stenosis.  Follow-Up Instructions: Return in about 4 weeks (around 01/28/2023).   Ortho Exam  Patient is alert, oriented, no adenopathy, well-dressed, normal affect, normal respiratory effort. Examination the left is no pain with motion there is a negative straight leg raise only.  She is globally tender to palpation around the right knee and swelling is tender over the iliotibial band distally.  She denies any back pain.  Pain is present at all times no change in sitting standing walking or sleeping.  Imaging: No results found. No images are attached to the encounter.  Labs: Lab Results  Component Value Date   HGBA1C 6.0 (H) 08/20/2022   HGBA1C 5.4 11/06/2021   HGBA1C 6.0 (H) 03/02/2014   REPTSTATUS 09/27/2018 FINAL 09/26/2018   CULT  09/26/2018    NO GROWTH Performed at [facility], [address], [place], [place] [address]    [facility] ESCHERICHIA COLI 12/01/2011   LABORGA KLEBSIELLA PNEUMONIAE 12/01/2011      Lab Results  Component Value Date   ALBUMIN 3.2 (L) 08/20/2022   ALBUMIN 3.1 (L) 11/05/2021   ALBUMIN 3.5 05/14/2019    No results found for: "MG" No results found for: "VD25OH"  No results found for: "PREALBUMIN"    Latest Ref Rng & Units 08/21/2022    4:31 AM 08/20/2022    6:59 PM 11/26/2021    1:08 PM  CBC EXTENDED  WBC 4.0 - 10.5 K/uL 5.8  6.0    RBC 3.87 - 5.11 MIL/uL 3.99  4.15    Hemoglobin 12.0 - 15.0 g/dL 82.9  56.2  13.0   HCT 36.0 - 46.0 % 37.1  38.3  45.0   Platelets 150 - 400 K/uL 169  193    NEUT# 1.7 - 7.7 K/uL  4.0    Lymph# 0.7 - 4.0 K/uL  1.2       There is no height or weight on file to calculate BMI.  Orders:  No orders of the defined types were placed in this encounter.  No orders of the defined types were placed in this encounter.    Procedures: No procedures performed  Clinical Data: No additional findings.  ROS:  All other systems negative, except as noted in the HPI. Review of Systems  Objective: Vital Signs: There were no vitals taken for this visit.  Specialty Comments:  EXAM: MRI LUMBAR SPINE WITHOUT CONTRAST   TECHNIQUE: Multiplanar, multisequence MR imaging of the lumbar spine was performed. No intravenous contrast was administered.   COMPARISON:  Lumbar spine radiographs 02/13/2022, CT abdomen/pelvis 09/26/2018   FINDINGS: Segmentation: Standard; the lowest formed disc space is designated L5-S1.   Alignment: There is grade 1 anterolisthesis of L3 on L4 and L4 on L5 and trace retrolisthesis of L5 on S1.   Vertebrae: Vertebral body heights are preserved. Background marrow signal is normal. There is a prominent intraosseous hemangioma in the T11 vertebral body, unchanged since the prior CT from 2020. There are scattered small Schmorl's nodes without associated edema. There is no suspicious marrow signal abnormality or marrow edema.   Conus medullaris and cauda equina: Conus extends to the L1 level. Conus and  cauda equina appear normal.   Paraspinal and other soft tissues: T2 hyperintense lesions in the kidneys most likely reflect benign cysts for which no specific imaging follow-up is required. The paraspinal soft tissues are unremarkable.   Disc levels:   There is advanced disc desiccation and narrowing at L5-S1. There is mild disc degeneration at the other levels.   T12-L1: There is a mild disc bulge and mild bilateral facet arthropathy resulting in mild left worse than right neural foraminal stenosis without significant spinal canal stenosis   L1-L2: There is a mild disc bulge and mild facet arthropathy without significant spinal canal or neural foraminal stenosis   L2-L3: There is a mild disc bulge eccentric to the left and mild bilateral facet arthropathy without significant spinal canal or neural foraminal stenosis   L3-L4: There is grade 1 anterolisthesis with a diffuse disc bulge and advanced bilateral facet arthropathy with ligamentum flavum thickening resulting in severe spinal canal stenosis with cauda equina nerve root compression and moderate right and mild left neural foraminal stenosis   L4-L5: There is grade 1 anterolisthesis with a disc bulge eccentric to the right and advanced bilateral facet arthropathy with ligamentum flavum thickening resulting in severe spinal canal stenosis with cauda equina nerve root compression and severe right worse than left neural foraminal stenosis   L5-S1: There is advanced disc desiccation and narrowing with diffuse disc bulge and moderate bilateral facet arthropathy with trace effusions resulting in effacement of the bilateral subarticular zones (left worse than right) with potential impingement of either traversing S1 nerve root and moderate bilateral neural foraminal stenosis.   IMPRESSION: 1. Grade 1 anterolisthesis of L3 on L4 and L4 on L5 with associated disc bulges and advanced facet arthropathy resulting in  severe spinal canal stenosis at both levels, and moderate right and mild left neural foraminal stenosis at L3-L4 and severe right worse than left neural foraminal stenosis at L4-L5. 2. Advanced disc degeneration with a bulge and bilateral facet arthropathy at L5-S1 resulting in effacement of the subarticular zones, left worse than right, with potential impingement of either traversing S1 nerve root and moderate bilateral neural foraminal stenosis.     Electronically Signed   By: Lesia Hausen M.D.   On: 04/24/2022 08:48  PMFS History: Patient Active Problem List   Diagnosis Date Noted   Bilateral primary osteoarthritis of knee 11/01/2022   Encounter for general adult medical examination with abnormal findings 11/01/2022   Chronic kidney disease, stage III (moderate) 08/21/2022   Acute CVA (cerebrovascular accident) 11/06/2021   Dyslipidemia 11/06/2021   GERD without esophagitis 11/06/2021   CKD (chronic  kidney disease), stage III 11/23/2016   Pericardial effusion 11/23/2016   Complete heart block 11/16/2016   Non-ST elevation (NSTEMI) myocardial infarction 11/16/2016   Renal insufficiency 11/03/2015   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 09/06/2015   Uncontrolled hypertension 08/31/2015   HTN (hypertension), malignant    Symptomatic bradycardia    Hyperlipidemia    Epigastric fullness    Near syncope 08/28/2015   CAD S/P PCI    Essential hypertension 03/05/2013   Class 1 obesity 03/05/2013   Hyperlipidemia with target LDL less than 70 03/05/2013   Obstructive sleep apnea 03/05/2013   Past Medical History:  Diagnosis Date   Arthritis    CKD (chronic kidney disease), stage III    Complete heart block 11/16/2016   a. transient during NSTEMI, resolved with revascularization.   Coronary artery disease 11/2009   a. with prior stent placement to the ramus intermedius and RCA. b. NSTEMI 11/2016 s/p DES to DES to distal Cx with moderate residual  dz.   CVA (cerebral vascular accident)    Essential hypertension    Mixed hyperlipidemia    Non-ST elevation (NSTEMI) myocardial infarction 11/16/2016   Obesity    Osteoarthritis    Reflux esophagitis    Sleep apnea 09/27/2010   Untreated, REM 64.7/hr AHI 18.5/hr RDI 19.2/hr. Patuent refused CPAP therapy    Family History  Problem Relation Age of Onset   Hypertension Mother    Aneurysm Mother        died at age 23   Other Father        unsure of medical history   Hypertension Other     Past Surgical History:  Procedure Laterality Date   CHOLECYSTECTOMY     CORONARY ANGIOPLASTY WITH STENT PLACEMENT  2007   A 3.0x39mm CYPHER stent post dilated to 3.29 mm the 100% occlusion was reduced to 0%   CORONARY STENT INTERVENTION N/A 11/16/2016   Procedure: Coronary Stent Intervention;  Surgeon: Tonny Bollman, MD;  Location: [facility] INVASIVE CV LAB;  Service: Cardiovascular;  Laterality: N/A;   LEFT HEART CATH AND CORONARY ANGIOGRAPHY N/A 11/16/2016   Procedure: Left Heart Cath and Coronary Angiography;  Surgeon: Tonny Bollman, MD;  Location: [facility] INVASIVE CV LAB;  Service: Cardiovascular;  Laterality: N/A;   LEFT HEART CATHETERIZATION WITH CORONARY ANGIOGRAM N/A 07/12/2014   Procedure: LEFT HEART CATHETERIZATION WITH CORONARY ANGIOGRAM;  Surgeon: Micheline Chapman, MD;  Location: [facility] CATH LAB;  Service: Cardiovascular;  Laterality: N/A;   TEMPORARY PACEMAKER N/A 11/16/2016   Procedure: Temporary Pacemaker;  Surgeon: Tonny Bollman, MD;  Location: [facility] INVASIVE CV LAB;  Service: Cardiovascular;  Laterality: N/A;   Social History   Occupational History   Occupation: Retired  Tobacco Use   Smoking status: Never   Smokeless tobacco: Never  Vaping Use   Vaping Use: Never used  Substance and Sexual Activity   Alcohol use: No   Drug use: No   Sexual activity: Never

## 2023-01-14 ENCOUNTER — Encounter: Payer: Self-pay | Admitting: Internal Medicine

## 2023-01-14 ENCOUNTER — Ambulatory Visit: Payer: Medicare HMO | Attending: Internal Medicine | Admitting: Internal Medicine

## 2023-01-14 VITALS — BP 158/78 | HR 87 | Ht 65.0 in | Wt 190.6 lb

## 2023-01-14 DIAGNOSIS — I10 Essential (primary) hypertension: Secondary | ICD-10-CM

## 2023-01-14 DIAGNOSIS — I442 Atrioventricular block, complete: Secondary | ICD-10-CM

## 2023-01-14 DIAGNOSIS — I639 Cerebral infarction, unspecified: Secondary | ICD-10-CM | POA: Diagnosis not present

## 2023-01-14 NOTE — Patient Instructions (Addendum)
Medication Instructions:  Your physician recommends that you continue on your current medications as directed. Please refer to the Current Medication list given to you today.  Labwork: None ordered.  Testing/Procedures: None ordered.  Follow-Up:  Your physician wants you to follow-up in: one year with Dr. Gregg Taylor or EP APP.  You will receive a reminder letter in the mail two months in advance. If you don't receive a letter, please call our office to schedule the follow-up appointment.    Implantable Loop Recorder Placement, Care After This sheet gives you information about how to care for yourself after your procedure. Your health care provider may also give you more specific instructions. If you have problems or questions, contact your health care provider. What can I expect after the procedure? After the procedure, it is common to have: Soreness or discomfort near the incision. Some swelling or bruising near the incision.  Follow these instructions at home: Incision care  Monitor your cardiac device site for redness, swelling, and drainage. Call the device clinic at [phone] if you experience these symptoms or fever/chills.  Keep the large square bandage on your site for 24 hours and then you may remove it yourself. Keep the steri-strips underneath in place.   You may shower after 72 hours / 3 days from your procedure with the steri-strips in place. They will usually fall off on their own, or may be removed after 10 days. Pat dry.   Avoid lotions, ointments, or perfumes over your incision until it is well-healed.  Please do not submerge in water until your site is completely healed.   Your device is MRI compatible.   Remote monitoring is used to monitor your cardiac device from home. This monitoring is scheduled every month by our office. It allows us to keep an eye on the function of your device to ensure it is working properly.  If your wound site starts to bleed  apply pressure.    For help with the monitor please call [employer] Monitor Support Specialist directly at [phone].    If you have any questions/concerns please call the device clinic at [phone].  Activity  Return to your normal activities.  General instructions Follow instructions from your health care provider about how to manage your implantable loop recorder and transmit the information. Learn how to activate a recording if this is necessary for your type of device. You may go through a metal detection gate, and you may let someone hold a metal detector over your chest. Show your ID card if needed. Do not have an MRI unless you check with your health care provider first. Take over-the-counter and prescription medicines only as told by your health care provider. Keep all follow-up visits as told by your health care provider. This is important. Contact a health care provider if: You have redness, swelling, or pain around your incision. You have a fever. You have pain that is not relieved by your pain medicine. You have triggered your device because of fainting (syncope) or because of a heartbeat that feels like it is racing, slow, fluttering, or skipping (palpitations). Get help right away if you have: Chest pain. Difficulty breathing. Summary After the procedure, it is common to have soreness or discomfort near the incision. Change your dressing as told by your health care provider. Follow instructions from your health care provider about how to manage your implantable loop recorder and transmit the information. Keep all follow-up visits as told by your health care provider. This   is important. This information is not intended to replace advice given to you by your health care provider. Make sure you discuss any questions you have with your health care provider. Document Released: 08/08/2015 Document Revised: 10/12/2017 Document Reviewed: 10/12/2017 Elsevier Patient  Education  2020 Elsevier Inc.  

## 2023-01-14 NOTE — Progress Notes (Signed)
HPI [name] returns today after a long absence from my EP clinic. She is a pleasant 89 yo woman with a h/o CAD, HTN, transient heart block associated with an NSTEMI,  who has had 2 cryptogenic strokes in the last 13 months. Fortunately she has had very little residual. She denies palpitations and has not had any h/o atrial fib. She has had HTN and is under medical therapy. She is referred today to consider ILR insertion. No syncope.  Allergies  Allergen Reactions   Atorvastatin Nausea And Vomiting   Contrast Media [Iodinated Contrast Media] Other (See Comments)    Nausea/Vomiting and Shortness of Breath   Darvon [Propoxyphene] Nausea And Vomiting   Codeine Nausea And Vomiting and Anxiety     Current Outpatient Medications  Medication Sig Dispense Refill   acetaminophen (TYLENOL) 500 MG tablet Take 1,000 mg by mouth every 6 (six) hours as needed for mild pain or moderate pain.     amLODipine (NORVASC) 5 MG tablet TAKE 1 TABLET BY MOUTH EVERY DAY 90 tablet 3   clopidogrel (PLAVIX) 75 MG tablet TAKE 1 TABLET BY MOUTH EVERY DAY 90 tablet 1   fluticasone (FLONASE) 50 MCG/ACT nasal spray Place 2 sprays into both nostrils daily. (Patient taking differently: Place 2 sprays into both nostrils daily as needed.) 16 g 0   hydrALAZINE (APRESOLINE) 25 MG tablet Take 1 tablet (25 mg total) by mouth in the morning and at bedtime. 180 tablet 3   losartan (COZAAR) 100 MG tablet Take 100 mg by mouth daily.     Menthol, Topical Analgesic, (BIOFREEZE ROLL-ON) 4 % GEL Apply 1 application. topically daily as needed (pain).     nitroGLYCERIN (NITROSTAT) 0.4 MG SL tablet PLACE 1 TABLET UNDER THE TONGUE EVERY 5 MINUTES AS NEEDED. 25 tablet 3   pantoprazole (PROTONIX) 40 MG tablet TAKE 1 TABLET BY MOUTH EVERY DAY 90 tablet 0   predniSONE (DELTASONE) 10 MG tablet Take 1 tablet (10 mg total) by mouth daily with breakfast. 30 tablet 0   rosuvastatin (CRESTOR) 40 MG tablet TAKE 1 TABLET BY MOUTH EVERY DAY 90  tablet 1   No current facility-administered medications for this visit.     Past Medical History:  Diagnosis Date   Arthritis    CKD (chronic kidney disease), stage III (HCC)    Complete heart block (HCC) 11/16/2016   a. transient during NSTEMI, resolved with revascularization.   Coronary artery disease 11/2009   a. with prior stent placement to the ramus intermedius and RCA. b. NSTEMI 11/2016 s/p DES to DES to distal Cx with moderate residual dz.   CVA (cerebral vascular accident) [facility])    Essential hypertension    Mixed hyperlipidemia    Non-ST elevation (NSTEMI) myocardial infarction (HCC) 11/16/2016   Obesity    Osteoarthritis    Reflux esophagitis    Sleep apnea 09/27/2010   Untreated, REM 64.7/hr AHI 18.5/hr RDI 19.2/hr. Patuent refused CPAP therapy    ROS:   All systems reviewed and negative except as noted in the HPI.   Past Surgical History:  Procedure Laterality Date   CHOLECYSTECTOMY     CORONARY ANGIOPLASTY WITH STENT PLACEMENT  2007   A 3.0x55mm CYPHER stent post dilated to 3.29 mm the 100% occlusion was reduced to 0%   CORONARY STENT INTERVENTION N/A 11/16/2016   Procedure: Coronary Stent Intervention;  Surgeon: Tonny Bollman, MD;  Location: [facility] INVASIVE CV LAB;  Service: Cardiovascular;  Laterality: N/A;   LEFT HEART  CATH AND CORONARY ANGIOGRAPHY N/A 11/16/2016   Procedure: Left Heart Cath and Coronary Angiography;  Surgeon: Tonny Bollman, MD;  Location: [facility] INVASIVE CV LAB;  Service: Cardiovascular;  Laterality: N/A;   LEFT HEART CATHETERIZATION WITH CORONARY ANGIOGRAM N/A 07/12/2014   Procedure: LEFT HEART CATHETERIZATION WITH CORONARY ANGIOGRAM;  Surgeon: Micheline Chapman, MD;  Location: [facility] CATH LAB;  Service: Cardiovascular;  Laterality: N/A;   TEMPORARY PACEMAKER N/A 11/16/2016   Procedure: Temporary Pacemaker;  Surgeon: Tonny Bollman, MD;  Location: [facility] INVASIVE CV LAB;  Service: Cardiovascular;  Laterality: N/A;     Family History  Problem Relation Age of  Onset   Hypertension Mother    Aneurysm Mother        died at age 69   Other Father        unsure of medical history   Hypertension Other      Social History   Socioeconomic History   Marital status: Single    Spouse name: Not on file   Number of children: 9   Years of education: 10th grade   Highest education level: Not on file  Occupational History   Occupation: Retired  Tobacco Use   Smoking status: Never   Smokeless tobacco: Never  Vaping Use   Vaping Use: Never used  Substance and Sexual Activity   Alcohol use: No   Drug use: No   Sexual activity: Never  Other Topics Concern   Not on file  Social History Narrative   Lives alone (children/grandchildren come by daily - sometimes spend the night).   Right-handed.   Nine children (8 living).   Social Determinants of Health   Financial Resource Strain: Not on file  Food Insecurity: No Food Insecurity (08/21/2022)   Hunger Vital Sign    Worried About Running Out of Food in the Last Year: Never true    Ran Out of Food in the Last Year: Never true  Transportation Needs: No Transportation Needs (08/21/2022)   PRAPARE - Administrator, Civil Service (Medical): No    Lack of Transportation (Non-Medical): No  Physical Activity: Not on file  Stress: Not on file  Social Connections: Not on file  Intimate Partner Violence: Not At Risk (08/21/2022)   Humiliation, Afraid, Rape, and Kick questionnaire    Fear of Current or Ex-Partner: No    Emotionally Abused: No    Physically Abused: No    Sexually Abused: No     BP (!) 158/78   Pulse 87   Ht 5\' 5"  (1.651 m)   Wt 190 lb 9.6 oz (86.5 kg)   SpO2 98%   BMI 31.72 kg/m   Physical Exam:  Well appearing NAD HEENT: Unremarkable Neck:  No JVD, no thyromegally Lymphatics:  No adenopathy Back:  No CVA tenderness Lungs:  Clear with no wheezes HEART:  Regular rate rhythm, no murmurs, no rubs, no clicks Abd:  soft, positive bowel sounds, no organomegally,  no rebound, no guarding Ext:  2 plus pulses, no edema, no cyanosis, no clubbing Skin:  No rashes no nodules Neuro:  CN II through XII intact, motor grossly intact  EKG - nsr  Assess/Plan: Cryptogenic stroke - I have discussed the treatment options with the patient and her daughter and recommended ILR insertion and she wishes to proceed. CAD - she denies anginal symptoms. We will follow. Transient CHB - this was in association with a remote NSTEMI. No recurrence. She will undergo watchful waiting. HTN - her bp is up  a little today. She will continue her current meds.   EP Procedure Note  Procedure Performed: ILR insertion  Preoperative diagnosis: cryptogenic stroke Post op diagnosis: cryptogenic stroke  Description of the procedure: after informed consent was obtained, the patient was prepped and draped in a sterile fashion. 10 cc of lidocaine was infiltrated into the left pectoral region. A one cm stab incision was carried out. The [employer]onic ILR, # [id number] was inserted. The R waves measured 0.55 mV. Benzoin and steristrips and a bandage was applied and the patient recovered in the usual manner.  Complications: none immediately  Conclusion: successful ILR insertion.   Sharlot Gowda Shailen Thielen,MD

## 2023-01-18 ENCOUNTER — Telehealth: Payer: Self-pay | Admitting: Internal Medicine

## 2023-01-18 NOTE — Telephone Encounter (Signed)
Contacted [name] to schedule their annual wellness visit. Appointment made for 10/02/2023.  per daughter last done by Dr Loreta Ave in 09/2022 did not want to schedule until 09/2023-srs   Thank you,  Judeth Cornfield,  AMB Clinical Support [facility] AWV Program Direct Dial ??[phone]

## 2023-01-23 ENCOUNTER — Telehealth: Payer: Self-pay

## 2023-01-23 NOTE — Telephone Encounter (Signed)
Patient called in requesting when the paper steri strips can be removed. ILR Implanted on 01/14/23, no signs of active drainage and denies any bleeding since procedure.  Area healing WNL no signs of infection. Patient informed okay to begin showers now and allow strips to fall off on their own. Do not scrub area.   Patient verbalizes understanding.

## 2023-01-29 ENCOUNTER — Ambulatory Visit (INDEPENDENT_AMBULATORY_CARE_PROVIDER_SITE_OTHER): Payer: Medicare HMO | Admitting: Orthopedic Surgery

## 2023-01-29 DIAGNOSIS — G8929 Other chronic pain: Secondary | ICD-10-CM | POA: Diagnosis not present

## 2023-01-29 DIAGNOSIS — M25561 Pain in right knee: Secondary | ICD-10-CM | POA: Diagnosis not present

## 2023-01-29 DIAGNOSIS — M25562 Pain in left knee: Secondary | ICD-10-CM | POA: Diagnosis not present

## 2023-01-30 ENCOUNTER — Encounter: Payer: Self-pay | Admitting: Orthopedic Surgery

## 2023-01-30 DIAGNOSIS — G8929 Other chronic pain: Secondary | ICD-10-CM | POA: Diagnosis not present

## 2023-01-30 DIAGNOSIS — M25562 Pain in left knee: Secondary | ICD-10-CM

## 2023-01-30 DIAGNOSIS — M25561 Pain in right knee: Secondary | ICD-10-CM | POA: Diagnosis not present

## 2023-01-30 NOTE — Progress Notes (Signed)
Office Visit Note   Patient: [name]           Date of Birth: 07-05-1934           MRN: [id number] Visit Date: 01/29/2023              Requested by: [name], MD [address] [place],  [place] [address] PCP: [name], MD  Chief Complaint  Patient presents with   Right Knee - Follow-up   Left Knee - Follow-up      HPI: Patient is an 89 year old woman with osteoarthritis of both knees.  Patient has completed a course of low-dose prednisone.  She is status post previous steroid injections which have temporarily helped.  Assessment & Plan: Visit Diagnoses:  1. Chronic pain of both knees     Plan: Both knees were injected she tolerated this well recommend that she could try Voltaren gel as well. Discussed total knee arthroplasty postoperative care.  Patient states she is not interested in total knee arthroplasty.   Follow-Up Instructions: No follow-ups on file.   Ortho Exam  Patient is alert, oriented, no adenopathy, well-dressed, normal affect, normal respiratory effort. Examination patient is ambulating in a wheelchair.  She has crepitation with range of motion of both knees there is no effusion she is tender to palpation in the patellofemoral joint as well as medial and lateral joint line.  Imaging: No results found. No images are attached to the encounter.  Labs: Lab Results  Component Value Date   HGBA1C 6.0 (H) 08/20/2022   HGBA1C 5.4 11/06/2021   HGBA1C 6.0 (H) 03/02/2014   REPTSTATUS 09/27/2018 FINAL 09/26/2018   CULT  09/26/2018    NO GROWTH Performed at [facility], [address], [place], [place] [address]    [facility] ESCHERICHIA COLI 12/01/2011   LABORGA KLEBSIELLA PNEUMONIAE 12/01/2011     Lab Results  Component Value Date   ALBUMIN 3.2 (L) 08/20/2022   ALBUMIN 3.1 (L) 11/05/2021   ALBUMIN 3.5 05/14/2019    No results found for: "MG" No results found for: "VD25OH"  No results found for: "PREALBUMIN"     Latest Ref Rng & Units 08/21/2022    4:31 AM 08/20/2022    6:59 PM 11/26/2021    1:08 PM  CBC EXTENDED  WBC 4.0 - 10.5 K/uL 5.8  6.0    RBC 3.87 - 5.11 MIL/uL 3.99  4.15    Hemoglobin 12.0 - 15.0 g/dL 86.5  78.4  69.6   HCT 36.0 - 46.0 % 37.1  38.3  45.0   Platelets 150 - 400 K/uL 169  193    NEUT# 1.7 - 7.7 K/uL  4.0    Lymph# 0.7 - 4.0 K/uL  1.2       There is no height or weight on file to calculate BMI.  Orders:  No orders of the defined types were placed in this encounter.  No orders of the defined types were placed in this encounter.    Procedures: Large Joint Inj: bilateral knee on 01/30/2023 3:04 PM Indications: pain and diagnostic evaluation Details: 22 G 1.5 in needle, anteromedial approach  Arthrogram: No  Outcome: tolerated well, no immediate complications Procedure, treatment alternatives, risks and benefits explained, specific risks discussed. Consent was given by the patient. Immediately prior to procedure a time out was called to verify the correct patient, procedure, equipment, support staff and site/side marked as required. Patient was prepped and draped in the  usual sterile fashion.      Clinical Data: No additional findings.  ROS:  All other systems negative, except as noted in the HPI. Review of Systems  Objective: Vital Signs: There were no vitals taken for this visit.  Specialty Comments:  EXAM: MRI LUMBAR SPINE WITHOUT CONTRAST   TECHNIQUE: Multiplanar, multisequence MR imaging of the lumbar spine was performed. No intravenous contrast was administered.   COMPARISON:  Lumbar spine radiographs 02/13/2022, CT abdomen/pelvis 09/26/2018   FINDINGS: Segmentation: Standard; the lowest formed disc space is designated L5-S1.   Alignment: There is grade 1 anterolisthesis of L3 on L4 and L4 on L5 and trace retrolisthesis of L5 on S1.   Vertebrae: Vertebral body heights are preserved. Background marrow signal is normal. There is a prominent  intraosseous hemangioma in the T11 vertebral body, unchanged since the prior CT from 2020. There are scattered small Schmorl's nodes without associated edema. There is no suspicious marrow signal abnormality or marrow edema.   Conus medullaris and cauda equina: Conus extends to the L1 level. Conus and cauda equina appear normal.   Paraspinal and other soft tissues: T2 hyperintense lesions in the kidneys most likely reflect benign cysts for which no specific imaging follow-up is required. The paraspinal soft tissues are unremarkable.   Disc levels:   There is advanced disc desiccation and narrowing at L5-S1. There is mild disc degeneration at the other levels.   T12-L1: There is a mild disc bulge and mild bilateral facet arthropathy resulting in mild left worse than right neural foraminal stenosis without significant spinal canal stenosis   L1-L2: There is a mild disc bulge and mild facet arthropathy without significant spinal canal or neural foraminal stenosis   L2-L3: There is a mild disc bulge eccentric to the left and mild bilateral facet arthropathy without significant spinal canal or neural foraminal stenosis   L3-L4: There is grade 1 anterolisthesis with a diffuse disc bulge and advanced bilateral facet arthropathy with ligamentum flavum thickening resulting in severe spinal canal stenosis with cauda equina nerve root compression and moderate right and mild left neural foraminal stenosis   L4-L5: There is grade 1 anterolisthesis with a disc bulge eccentric to the right and advanced bilateral facet arthropathy with ligamentum flavum thickening resulting in severe spinal canal stenosis with cauda equina nerve root compression and severe right worse than left neural foraminal stenosis   L5-S1: There is advanced disc desiccation and narrowing with diffuse disc bulge and moderate bilateral facet arthropathy with trace effusions resulting in effacement of the bilateral  subarticular zones (left worse than right) with potential impingement of either traversing S1 nerve root and moderate bilateral neural foraminal stenosis.   IMPRESSION: 1. Grade 1 anterolisthesis of L3 on L4 and L4 on L5 with associated disc bulges and advanced facet arthropathy resulting in severe spinal canal stenosis at both levels, and moderate right and mild left neural foraminal stenosis at L3-L4 and severe right worse than left neural foraminal stenosis at L4-L5. 2. Advanced disc degeneration with a bulge and bilateral facet arthropathy at L5-S1 resulting in effacement of the subarticular zones, left worse than right, with potential impingement of either traversing S1 nerve root and moderate bilateral neural foraminal stenosis.     Electronically Signed   By: Lesia Hausen M.D.   On: 04/24/2022 08:48  PMFS History: Patient Active Problem List   Diagnosis Date Noted   Bilateral primary osteoarthritis of knee 11/01/2022   Encounter for general adult medical examination with abnormal findings 11/01/2022  Chronic kidney disease, stage III (moderate) (HCC) 08/21/2022   Acute CVA (cerebrovascular accident) (HCC) 11/06/2021   Dyslipidemia 11/06/2021   GERD without esophagitis 11/06/2021   CKD (chronic kidney disease), stage III (HCC) 11/23/2016   Pericardial effusion 11/23/2016   Complete heart block (HCC) 11/16/2016   Non-ST elevation (NSTEMI) myocardial infarction (HCC) 11/16/2016   Renal insufficiency 11/03/2015   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 09/06/2015   Uncontrolled hypertension 08/31/2015   HTN (hypertension), malignant    Symptomatic bradycardia    Hyperlipidemia    Epigastric fullness    Near syncope 08/28/2015   CAD S/P PCI    Essential hypertension 03/05/2013   Class 1 obesity 03/05/2013   Hyperlipidemia with target LDL less than 70 03/05/2013   Obstructive sleep apnea 03/05/2013   Past Medical History:  Diagnosis  Date   Arthritis    CKD (chronic kidney disease), stage III (HCC)    Complete heart block (HCC) 11/16/2016   a. transient during NSTEMI, resolved with revascularization.   Coronary artery disease 11/2009   a. with prior stent placement to the ramus intermedius and RCA. b. NSTEMI 11/2016 s/p DES to DES to distal Cx with moderate residual dz.   CVA (cerebral vascular accident) [facility])    Essential hypertension    Mixed hyperlipidemia    Non-ST elevation (NSTEMI) myocardial infarction (HCC) 11/16/2016   Obesity    Osteoarthritis    Reflux esophagitis    Sleep apnea 09/27/2010   Untreated, REM 64.7/hr AHI 18.5/hr RDI 19.2/hr. Patuent refused CPAP therapy    Family History  Problem Relation Age of Onset   Hypertension Mother    Aneurysm Mother        died at age 63   Other Father        unsure of medical history   Hypertension Other     Past Surgical History:  Procedure Laterality Date   CHOLECYSTECTOMY     CORONARY ANGIOPLASTY WITH STENT PLACEMENT  2007   A 3.0x34mm CYPHER stent post dilated to 3.29 mm the 100% occlusion was reduced to 0%   CORONARY STENT INTERVENTION N/A 11/16/2016   Procedure: Coronary Stent Intervention;  Surgeon: Tonny Bollman, MD;  Location: [facility] INVASIVE CV LAB;  Service: Cardiovascular;  Laterality: N/A;   LEFT HEART CATH AND CORONARY ANGIOGRAPHY N/A 11/16/2016   Procedure: Left Heart Cath and Coronary Angiography;  Surgeon: Tonny Bollman, MD;  Location: [facility] INVASIVE CV LAB;  Service: Cardiovascular;  Laterality: N/A;   LEFT HEART CATHETERIZATION WITH CORONARY ANGIOGRAM N/A 07/12/2014   Procedure: LEFT HEART CATHETERIZATION WITH CORONARY ANGIOGRAM;  Surgeon: Micheline Chapman, MD;  Location: [facility] CATH LAB;  Service: Cardiovascular;  Laterality: N/A;   TEMPORARY PACEMAKER N/A 11/16/2016   Procedure: Temporary Pacemaker;  Surgeon: Tonny Bollman, MD;  Location: [facility] INVASIVE CV LAB;  Service: Cardiovascular;  Laterality: N/A;   Social History   Occupational History    Occupation: Retired  Tobacco Use   Smoking status: Never   Smokeless tobacco: Never  Vaping Use   Vaping Use: Never used  Substance and Sexual Activity   Alcohol use: No   Drug use: No   Sexual activity: Never

## 2023-01-31 ENCOUNTER — Encounter: Payer: Self-pay | Admitting: Internal Medicine

## 2023-01-31 ENCOUNTER — Ambulatory Visit (INDEPENDENT_AMBULATORY_CARE_PROVIDER_SITE_OTHER): Payer: Medicare HMO | Admitting: Internal Medicine

## 2023-01-31 VITALS — BP 130/79 | HR 94 | Ht 65.0 in | Wt 192.0 lb

## 2023-01-31 DIAGNOSIS — N183 Chronic kidney disease, stage 3 unspecified: Secondary | ICD-10-CM

## 2023-01-31 DIAGNOSIS — M170 Bilateral primary osteoarthritis of knee: Secondary | ICD-10-CM

## 2023-01-31 DIAGNOSIS — I639 Cerebral infarction, unspecified: Secondary | ICD-10-CM

## 2023-01-31 NOTE — Patient Instructions (Signed)
It was a pleasure to see you today.  Thank you for giving us the opportunity to be involved in your care.  Below is a brief recap of your visit and next steps.  We will plan to see you again in 3 months.  Summary No medication changes today. We will plan for follow up in 3 months.   

## 2023-01-31 NOTE — Progress Notes (Signed)
Established Patient Office Visit  Subjective   Patient ID: [name], female    DOB: 1934-03-24  Age: 89 y.o. MRN: [id number]  Chief Complaint  Patient presents with   Hypertension    Follow up   [name] returns to care today for routine follow-up.  She was last evaluated by me on 2/22 as a new patient presenting to establish care.  At that time I recommended that she discontinue ASA 81 mg daily and continue Plavix monotherapy as previously instructed upon hospital discharge from December 2023.  In the interim she has been seen by cardiology, orthopedic surgery, neurology, and nephrology.  Loop recorder implanted earlier this month in the setting of recent CVA.  There have otherwise been no acute interval events.  [name] reports feeling well today.  She is asymptomatic and has no acute concerns to discuss.  Past Medical History:  Diagnosis Date   Arthritis    CKD (chronic kidney disease), stage III (HCC)    Complete heart block (HCC) 11/16/2016   a. transient during NSTEMI, resolved with revascularization.   Coronary artery disease 11/2009   a. with prior stent placement to the ramus intermedius and RCA. b. NSTEMI 11/2016 s/p DES to DES to distal Cx with moderate residual dz.   CVA (cerebral vascular accident) [facility])    Essential hypertension    Mixed hyperlipidemia    Non-ST elevation (NSTEMI) myocardial infarction (HCC) 11/16/2016   Obesity    Osteoarthritis    Reflux esophagitis    Sleep apnea 09/27/2010   Untreated, REM 64.7/hr AHI 18.5/hr RDI 19.2/hr. Patuent refused CPAP therapy   Past Surgical History:  Procedure Laterality Date   CHOLECYSTECTOMY     CORONARY ANGIOPLASTY WITH STENT PLACEMENT  2007   A 3.0x60mm CYPHER stent post dilated to 3.29 mm the 100% occlusion was reduced to 0%   CORONARY STENT INTERVENTION N/A 11/16/2016   Procedure: Coronary Stent Intervention;  Surgeon: Tonny Bollman, MD;  Location[facility] INVASIVE CV LAB;  Service: Cardiovascular;  Laterality:  N/A;   LEFT HEART CATH AND CORONARY ANGIOGRAPHY N/A 11/16/2016   Procedure: Left Heart Cath and Coronary Angiography;  Surgeon: Tonny Bollman, MD;  Location: [facility] INVASIVE CV LAB;  Service: Cardiovascular;  Laterality: N/A;   LEFT HEART CATHETERIZATION WITH CORONARY ANGIOGRAM N/A 07/12/2014   Procedure: LEFT HEART CATHETERIZATION WITH CORONARY ANGIOGRAM;  Surgeon: Micheline Chapman, MD;  Location: [facility] CATH LAB;  Service: Cardiovascular;  Laterality: N/A;   TEMPORARY PACEMAKER N/A 11/16/2016   Procedure: Temporary Pacemaker;  Surgeon: Tonny Bollman, MD;  Location: [facility] INVASIVE CV LAB;  Service: Cardiovascular;  Laterality: N/A;   Social History   Tobacco Use   Smoking status: Never   Smokeless tobacco: Never  Vaping Use   Vaping Use: Never used  Substance Use Topics   Alcohol use: No   Drug use: No   Family History  Problem Relation Age of Onset   Hypertension Mother    Aneurysm Mother        died at age 59   Other Father        unsure of medical history   Hypertension Other    Allergies  Allergen Reactions   Atorvastatin Nausea And Vomiting   Contrast Media [Iodinated Contrast Media] Other (See Comments)    Nausea/Vomiting and Shortness of Breath   Darvon [Propoxyphene] Nausea And Vomiting   Codeine Nausea And Vomiting and Anxiety   Review of Systems  Constitutional:  Negative for chills and fever.  HENT:  Negative for sore throat.   Respiratory:  Negative for cough and shortness of breath.   Cardiovascular:  Negative for chest pain, palpitations and leg swelling.  Gastrointestinal:  Negative for abdominal pain, blood in stool, constipation, diarrhea, nausea and vomiting.  Genitourinary:  Negative for dysuria and hematuria.  Musculoskeletal:  Negative for myalgias.  Skin:  Negative for itching and rash.  Neurological:  Negative for dizziness and headaches.  Psychiatric/Behavioral:  Negative for depression and suicidal ideas.      Objective:     BP 130/79   Pulse 94   Ht  5\' 5"  (1.651 m)   Wt 192 lb (87.1 kg)   SpO2 96%   BMI 31.95 kg/m  BP Readings from Last 3 Encounters:  01/31/23 130/79  01/14/23 (!) 158/78  12/19/22 (!) 154/74   Physical Exam Vitals reviewed.  Constitutional:      General: She is not in acute distress.    Appearance: Normal appearance. She is obese. She is not toxic-appearing.     Comments: Examined in wheelchair  HENT:     Head: Normocephalic and atraumatic.     Right Ear: External ear normal.     Left Ear: External ear normal.     Nose: Nose normal. No congestion or rhinorrhea.     Mouth/Throat:     Mouth: Mucous membranes are moist.     Pharynx: Oropharynx is clear. No oropharyngeal exudate or posterior oropharyngeal erythema.  Eyes:     General: No scleral icterus.    Extraocular Movements: Extraocular movements intact.     Conjunctiva/sclera: Conjunctivae normal.     Pupils: Pupils are equal, round, and reactive to light.  Cardiovascular:     Rate and Rhythm: Normal rate and regular rhythm.     Pulses: Normal pulses.     Heart sounds: Normal heart sounds. No murmur heard.    No friction rub. No gallop.  Pulmonary:     Effort: Pulmonary effort is normal.     Breath sounds: Normal breath sounds. No wheezing, rhonchi or rales.  Abdominal:     General: Abdomen is flat. Bowel sounds are normal. There is no distension.     Palpations: Abdomen is soft.     Tenderness: There is no abdominal tenderness.  Musculoskeletal:        General: No swelling. Normal range of motion.     Cervical back: Normal range of motion.     Right lower leg: No edema.     Left lower leg: No edema.  Lymphadenopathy:     Cervical: No cervical adenopathy.  Skin:    General: Skin is warm and dry.     Capillary Refill: Capillary refill takes less than 2 seconds.     Coloration: Skin is not jaundiced.  Neurological:     General: No focal deficit present.     Mental Status: She is alert and oriented to person, place, and time.  Psychiatric:         Mood and Affect: Mood normal.        Behavior: Behavior normal.   Last CBC Lab Results  Component Value Date   WBC 5.8 08/21/2022   HGB 11.9 (L) 08/21/2022   HCT 37.1 08/21/2022   MCV 93.0 08/21/2022   MCH 29.8 08/21/2022   RDW 14.9 08/21/2022   PLT 169 08/21/2022   Last metabolic panel Lab Results  Component Value Date   GLUCOSE 109 (H) 08/21/2022   NA 140 08/21/2022   K  3.9 08/21/2022   CL 113 (H) 08/21/2022   CO2 22 08/21/2022   BUN 14 08/21/2022   CREATININE 1.03 (H) 08/21/2022   GFRNONAA 53 (L) 08/21/2022   CALCIUM 8.8 (L) 08/21/2022   PROT 7.0 08/20/2022   ALBUMIN 3.2 (L) 08/20/2022   LABGLOB 2.9 01/19/2015   AGRATIO 1.3 01/19/2015   BILITOT 0.5 08/20/2022   ALKPHOS 56 08/20/2022   AST 27 08/20/2022   ALT 20 08/20/2022   ANIONGAP 5 08/21/2022   Last lipids Lab Results  Component Value Date   CHOL 103 08/21/2022   HDL 41 08/21/2022   LDLCALC 50 08/21/2022   TRIG 58 08/21/2022   CHOLHDL 2.5 08/21/2022   Last hemoglobin A1c Lab Results  Component Value Date   HGBA1C 6.0 (H) 08/20/2022   Last thyroid functions Lab Results  Component Value Date   TSH 1.437 08/28/2015     Assessment & Plan:   Problem List Items Addressed This Visit       Left pontine stroke [facility])    History of left pontine CVA in December 2023.  She is currently on Plavix monotherapy.  Recently evaluated by neurology.  ILR recommended in the setting of embolic CVA.  This was performed earlier this month. -No medication changes today.  Continue Plavix/statin therapy.      Bilateral primary osteoarthritis of knee - Primary    Followed by orthopedic surgery (Dr. Lajoyce Corners).  Received corticosteroid injections in both knees.  Not a surgical candidate due to complicated medical history. -Rolling walker ordered today to assist with impaired ambulation in the setting of severe bilateral knee OA.      CKD (chronic kidney disease), stage III (HCC)    Renal function remains stable.   Recently seen by nephrology (Dr. Wynelle Link) for follow-up.       Return in about 3 months (around 05/03/2023).    [name], MD

## 2023-02-06 DIAGNOSIS — M170 Bilateral primary osteoarthritis of knee: Secondary | ICD-10-CM | POA: Diagnosis not present

## 2023-02-06 DIAGNOSIS — Z9181 History of falling: Secondary | ICD-10-CM | POA: Diagnosis not present

## 2023-02-06 NOTE — Assessment & Plan Note (Signed)
History of left pontine CVA in December 2023.  She is currently on Plavix monotherapy.  Recently evaluated by neurology.  ILR recommended in the setting of embolic CVA.  This was performed earlier this month. -No medication changes today.  Continue Plavix/statin therapy.

## 2023-02-06 NOTE — Assessment & Plan Note (Addendum)
Followed by orthopedic surgery (Dr. Lajoyce Corners).  Received corticosteroid injections in both knees.  Not a surgical candidate due to complicated medical history. -Rolling walker ordered today to assist with impaired ambulation in the setting of severe bilateral knee OA.

## 2023-02-06 NOTE — Assessment & Plan Note (Addendum)
Renal function remains stable.  Recently seen by nephrology (Dr. Wynelle Link) for follow-up.

## 2023-02-11 ENCOUNTER — Ambulatory Visit: Payer: Medicare HMO | Attending: Nurse Practitioner | Admitting: Nurse Practitioner

## 2023-02-11 ENCOUNTER — Telehealth: Payer: Self-pay

## 2023-02-11 ENCOUNTER — Encounter: Payer: Self-pay | Admitting: Nurse Practitioner

## 2023-02-11 VITALS — BP 132/70 | HR 100 | Ht 65.0 in | Wt 191.2 lb

## 2023-02-11 DIAGNOSIS — I2510 Atherosclerotic heart disease of native coronary artery without angina pectoris: Secondary | ICD-10-CM

## 2023-02-11 DIAGNOSIS — R002 Palpitations: Secondary | ICD-10-CM | POA: Diagnosis not present

## 2023-02-11 DIAGNOSIS — R000 Tachycardia, unspecified: Secondary | ICD-10-CM | POA: Diagnosis not present

## 2023-02-11 DIAGNOSIS — E782 Mixed hyperlipidemia: Secondary | ICD-10-CM | POA: Diagnosis not present

## 2023-02-11 DIAGNOSIS — I452 Bifascicular block: Secondary | ICD-10-CM | POA: Diagnosis not present

## 2023-02-11 DIAGNOSIS — Z8673 Personal history of transient ischemic attack (TIA), and cerebral infarction without residual deficits: Secondary | ICD-10-CM | POA: Diagnosis not present

## 2023-02-11 DIAGNOSIS — N183 Chronic kidney disease, stage 3 unspecified: Secondary | ICD-10-CM

## 2023-02-11 DIAGNOSIS — I10 Essential (primary) hypertension: Secondary | ICD-10-CM

## 2023-02-11 MED ORDER — METOPROLOL SUCCINATE ER 25 MG PO TB24: 12.5000 mg | ORAL_TABLET | Freq: Every day | ORAL | 5 refills | Status: DC

## 2023-02-11 NOTE — Telephone Encounter (Signed)
Repeat ILR alert for tachy Event occurred 6/1 @ 15:05, duration 23sec, HR 125 HR's have been 120-146 on alerts, clinic has been notified, increase HR alert to 150 - route for awareness LA, CVRS  Multiple tachy alerts. Reviewed episodes w/ MD Mealor. Suspicious for AF/AFL. Considering hx of previous cryptogenic strokes MD AM ordered Afib clinic. Pt and daughter made aware. Unable to come to [place] at AFclinic; will be seen in [place] today for [facility].

## 2023-02-11 NOTE — Progress Notes (Unsigned)
Office Visit    Patient Name: [name] Date of Encounter: 02/11/2023  PCP:  [name], MD   [facility]  Cardiologist:  Nona Dell, MD  Advanced Practice Provider:  No care team member to display Electrophysiologist:  None 46}  Chief Complaint    [name] is a 89 y.o. female with a hx of CAD, hx of NSTEMI, mixed HLD, hx of prior stroke, OSA, obesity, CKD stage IIIb, and HTN, who presents today for arrhthymia evaluation.    Past Medical History    Past Medical History:  Diagnosis Date   Arthritis    CKD (chronic kidney disease), stage III (HCC)    Complete heart block (HCC) 11/16/2016   a. transient during NSTEMI, resolved with revascularization.   Coronary artery disease 11/2009   a. with prior stent placement to the ramus intermedius and RCA. b. NSTEMI 11/2016 s/p DES to DES to distal Cx with moderate residual dz.   CVA (cerebral vascular accident) [facility])    Essential hypertension    Mixed hyperlipidemia    Non-ST elevation (NSTEMI) myocardial infarction (HCC) 11/16/2016   Obesity    Osteoarthritis    Reflux esophagitis    Sleep apnea 09/27/2010   Untreated, REM 64.7/hr AHI 18.5/hr RDI 19.2/hr. Patuent refused CPAP therapy   Past Surgical History:  Procedure Laterality Date   CHOLECYSTECTOMY     CORONARY ANGIOPLASTY WITH STENT PLACEMENT  2007   A 3.0x22mm CYPHER stent post dilated to 3.29 mm the 100% occlusion was reduced to 0%   CORONARY STENT INTERVENTION N/A 11/16/2016   Procedure: Coronary Stent Intervention;  Surgeon: Tonny Bollman, MD;  Location: [facility] INVASIVE CV LAB;  Service: Cardiovascular;  Laterality: N/A;   LEFT HEART CATH AND CORONARY ANGIOGRAPHY N/A 11/16/2016   Procedure: Left Heart Cath and Coronary Angiography;  Surgeon: Tonny Bollman, MD;  Location: [facility] INVASIVE CV LAB;  Service: Cardiovascular;  Laterality: N/A;   LEFT HEART CATHETERIZATION WITH CORONARY ANGIOGRAM N/A 07/12/2014   Procedure: LEFT HEART  CATHETERIZATION WITH CORONARY ANGIOGRAM;  Surgeon: Micheline Chapman, MD;  Location: [facility] CATH LAB;  Service: Cardiovascular;  Laterality: N/A;   TEMPORARY PACEMAKER N/A 11/16/2016   Procedure: Temporary Pacemaker;  Surgeon: Tonny Bollman, MD;  Location: [facility] INVASIVE CV LAB;  Service: Cardiovascular;  Laterality: N/A;    Allergies  Allergies  Allergen Reactions   Atorvastatin Nausea And Vomiting   Contrast Media [Iodinated Contrast Media] Other (See Comments)    Nausea/Vomiting and Shortness of Breath   Darvon [Propoxyphene] Nausea And Vomiting   Codeine Nausea And Vomiting and Anxiety    History of Present Illness    [name] is a 89 y.o. female with a PMH as mentioned above.   Previous cardiovascular history of stent intervention to ramus intermedius and RCA in 2011.  Had NSTEMI in 2018, s/p DES to distal circumflex.  Was hospitalized with CVA 08/2022.  Imaging revealed multiple left hemispheric punctate and ischemic infarcts, along left temporal lobe suggestive of embolic disease.  Monitoring in hospital did not show any evidence of A-fib.  Discharged on DAPT, will transition to Plavix alone long-term.  Echocardiogram obtained-see report below.  Last seen by Dr. Diona Browner on November 21, 2022.  Was overall doing well from a cardiac perspective.  Denied any chest pain.  Saw Dr. Ladona Ridgel on Jan 14, 2023 for consideration for ILR insertion.  ILR has alerted episodes of tachycardia.  Recent alert occurred on June 1 with heart rate  120s, duration lasted 23 seconds.  Overall heart rates ranging 120s to 140s on alerts.  Episodes reviewed with MD, suspicious for A-fib/a flutter.  Was unable to be seen at A-fib clinic.   She presents today for A-fib, A-flutter evaluation. She is overall doing well, however daughter admits that she does have palpitations with associated diaphoresis at times, denies any chest pain, shortness of breath, syncope, presyncope, dizziness, orthopnea, PND, swelling or  significant weight changes, acute bleeding, or claudication.  I went over images with Dr. Nelly Laurence (via secure chat) and he stated he did not recall analyzing these image, even though it is stated in documentation that he did.  He agreed that images did not show A-fib/a flutter, she is in sinus rhythm with P waves present, fast rhythm, appears like sinus tachycardia/SVT.  He agreed that she does not need to start oral anticoagulation.  EKGs/Labs/Other Studies Reviewed:   The following studies were reviewed today:  Echocardiogram 08/2022: 1. Left ventricular ejection fraction, by estimation, is 70 to 75%. The  left ventricle has hyperdynamic function. The left ventricle has no  regional wall motion abnormalities. There is severe concentric left  ventricular hypertrophy. Left ventricular  diastolic parameters are consistent with Grade I diastolic dysfunction  (impaired relaxation).   2. Right ventricular systolic function is normal. The right ventricular  size is normal. There is normal pulmonary artery systolic pressure. The  estimated right ventricular systolic pressure is 24.0 mmHg.   3. Left atrial size was mildly dilated.   4. The mitral valve is grossly normal. Mild mitral valve regurgitation.   5. The aortic valve is tricuspid. Aortic valve regurgitation is mild.  Aortic valve sclerosis is present, with no evidence of aortic valve  stenosis. Aortic valve mean gradient measures 5.0 mmHg.   6. Agitated saline contrast bubble study was negative, with no evidence  of right to left interatrial shunt.   Comparison(s): No significant change from prior study. Prior images  reviewed side by side.  Carotid Doppler 08/2022: MPRESSION: 1. Right carotid artery system: Patent without significant atherosclerotic plaque formation.   2. Left carotid artery system: Patent without significant atherosclerotic plaque formation.   3.  Vertebral artery system: Patent with antegrade flow  bilaterally.  Cardiac monitor 12/2021: Prevent[employer] monitor reviewed. 30 days analyzed. Predominant rhythm is sinus with heart rate ranging from 56 bpm up to 128 bpm and average heart rate 75 bpm. Frequent PACs were noted representing approximately 8% total beats. There were rare PVCs representing less than 1% total beats. Episodes of NSVT were also noted ranging from 4 beats to 8 beats, none were sustained. No atrial fibrillation was observed. There were no pauses.  Left heart cath 11/2016: 1. Critical stenosis of the distal circumflex treated successfully with PCI using a 2.5 mm drug-eluting stent 2. Moderate mid LAD stenosis, does not appear to be flow-obstructive 3. Moderate proximal RCA stenosis (nondominant vessel) 4. Patent stents in the RCA and ramus intermedius 5. Normal LVEDP-will assess LV function by echo because of chronic kidney disease 6. Successful insertion of a temporary transvenous pacing wire for treatment of complete heart block   Dual antiplatelet therapy with aspirin and brilinta for at least one year, follow heart rhythm closely. EP service notified.  Recent Labs: 08/20/2022: ALT 20 08/21/2022: BUN 14; Creatinine, Ser 1.03; Hemoglobin 11.9; Platelets 169; Potassium 3.9; Sodium 140  Recent Lipid Panel    Component Value Date/Time   CHOL 103 08/21/2022 0431   CHOL 146 01/19/2015 1048  TRIG 58 08/21/2022 0431   HDL 41 08/21/2022 0431   HDL 48 01/19/2015 1048   CHOLHDL 2.5 08/21/2022 0431   VLDL 12 08/21/2022 0431   LDLCALC 50 08/21/2022 0431   LDLCALC 77 01/19/2015 1048    Home Medications   Current Meds  Medication Sig   acetaminophen (TYLENOL) 500 MG tablet Take 1,000 mg by mouth every 6 (six) hours as needed for mild pain or moderate pain.   amLODipine (NORVASC) 5 MG tablet TAKE 1 TABLET BY MOUTH EVERY DAY   clopidogrel (PLAVIX) 75 MG tablet TAKE 1 TABLET BY MOUTH EVERY DAY   fluticasone (FLONASE) 50 MCG/ACT nasal spray Place 2 sprays into both nostrils  daily. (Patient taking differently: Place 2 sprays into both nostrils daily as needed.)   hydrALAZINE (APRESOLINE) 25 MG tablet Take 1 tablet (25 mg total) by mouth in the morning and at bedtime.   losartan (COZAAR) 100 MG tablet Take 100 mg by mouth daily.   Menthol, Topical Analgesic, (BIOFREEZE ROLL-ON) 4 % GEL Apply 1 application. topically daily as needed (pain).   metoprolol succinate (TOPROL XL) 25 MG 24 hr tablet Take 0.5 tablets (12.5 mg total) by mouth daily.   nitroGLYCERIN (NITROSTAT) 0.4 MG SL tablet PLACE 1 TABLET UNDER THE TONGUE EVERY 5 MINUTES AS NEEDED.   pantoprazole (PROTONIX) 40 MG tablet TAKE 1 TABLET BY MOUTH EVERY DAY   rosuvastatin (CRESTOR) 40 MG tablet TAKE 1 TABLET BY MOUTH EVERY DAY     Review of Systems    All other systems reviewed and are otherwise negative except as noted above.  Physical Exam    VS:  BP 132/70   Pulse 100   Ht 5\' 5"  (1.651 m)   Wt 191 lb 3.2 oz (86.7 kg)   SpO2 96%   BMI 31.82 kg/m  , BMI Body mass index is 31.82 kg/m.  Wt Readings from Last 3 Encounters:  02/11/23 191 lb 3.2 oz (86.7 kg)  01/31/23 192 lb (87.1 kg)  01/14/23 190 lb 9.6 oz (86.5 kg)     GEN: Obese, 89 y.o. female in no acute distress. HEENT: normal. Neck: Supple, no JVD, carotid bruits, or masses. Cardiac: S1/S2, RRR, no murmurs, rubs, or gallops. No clubbing, cyanosis, edema.  Radials/PT 2+ and equal bilaterally.  Respiratory:  Respirations regular and unlabored, clear to auscultation bilaterally. MS: No deformity or atrophy. Skin: Warm and dry, no rash. Neuro:  Strength and sensation are intact. Psych: Normal affect.  Assessment & Plan    CAD, s/p NSTEMI Hx of stent intervention to ramus intermedius and RCA in 2011.  Had NSTEMI in 2018, s/p DES to distal circumflex.Stable with no anginal symptoms. No indication for ischemic evaluation.  Continue Plavix, losartan, and rosuvastatin. Will start Toprol XL 12.5 mg daily. Heart healthy diet and regular  cardiovascular exercise encouraged.   Tachycardia, palpitations  ILR has signaled tachy events, images reviewed with Dr. Nelly Laurence, he confirmed that there is no evidence of A-fib/Aflutter, therefore no indication for [facility]. HR 100 bpm today, SR on exam. Will initiate Toprol XL as mentioned above. Heart healthy diet and regular cardiovascular exercise encouraged.   Mixed HLD Last LDL 75. Continue rosuvastatin. Heart healthy diet and regular cardiovascular exercise encouraged. If no improvement at next check in the future, plan to discuss Zetia and/or PCSK9i or another adjunctive medication.   HTN BP well controlled. Discussed to monitor BP at home at least 2 hours after medications and sitting for 5-10 minutes. Adding Toprol XL to medication  regimen as mentioned above. No other medication changes at this time. Heart healthy diet and regular cardiovascular exercise encouraged.   Hx of prior stroke Denies any symptoms. Continue current medication regimen. Heart healthy diet and regular cardiovascular exercise encouraged.   CKD stage 3 Most recent sCr on file 1.03 with eGFR 53. Avoid nephrotoxic agents. Encouraged adequate hydration. Continue to follow with PCP.  Disposition: Follow up in 6 week(s) with Nona Dell, MD or APP.  Signed, Sharlene Dory, NP 02/13/2023, 12:34 PM [facility]

## 2023-02-11 NOTE — Patient Instructions (Signed)
Medication Instructions:   Begin Toprol (Metoprol Succ) 12.5mg  daily  Continue all other medications.     Labwork:  none  Testing/Procedures:  none  Follow-Up:  6 weeks  Any Other Special Instructions Will Be Listed Below (If Applicable).   If you need a refill on your cardiac medications before your next appointment, please call your pharmacy.

## 2023-02-18 ENCOUNTER — Ambulatory Visit (INDEPENDENT_AMBULATORY_CARE_PROVIDER_SITE_OTHER): Payer: Medicare HMO

## 2023-02-18 DIAGNOSIS — I442 Atrioventricular block, complete: Secondary | ICD-10-CM

## 2023-02-18 LAB — CUP PACEART REMOTE DEVICE CHECK
Date Time Interrogation Session: 20240609230716
Implantable Pulse Generator Implant Date: 20240506

## 2023-02-21 ENCOUNTER — Telehealth: Payer: Self-pay | Admitting: Cardiology

## 2023-02-21 ENCOUNTER — Encounter: Payer: Self-pay | Admitting: Internal Medicine

## 2023-02-21 NOTE — Telephone Encounter (Signed)
Spoke with daughter Tobi Bastos) - she has concerns about her mother's BP dropping off too low & her fainting.  She has not started this medication yet because she says she spoke with her pharmacy for about 30 minutes reviewing her medications and was told that she was on 2 beta blockers.  We only have the Toprol on our list.  She will call the pharmacy now and let us know what the other medication was.    ILR from 02/17/23 -   ILR summary report received. Battery status OK. Normal device function. No new symptom, brady, or pause episodes. No new AF episodes. Monthly summary reports and ROV/PRN  4 tachy events, narrow complex, longest duration 42sec, HR's 120-146  LA, CVRS

## 2023-02-21 NOTE — Telephone Encounter (Signed)
Pt c/o medication issue:  1. Name of Medication:   metoprolol succinate (TOPROL XL) 25 MG 24 hr tablet   2. How are you currently taking this medication (dosage and times per day)?  Currently not taking  3. Are you having a reaction (difficulty breathing--STAT)?   4. What is your medication issue?   Daughter is concerned this medication may cause the patient to faint.  Daughter stated patient has not started on this medication and wants a call back to discuss this medication change.

## 2023-02-25 ENCOUNTER — Ambulatory Visit
Admission: EM | Admit: 2023-02-25 | Discharge: 2023-02-25 | Disposition: A | Discharge status: Home / Self Care | Payer: Medicare HMO | Attending: Nurse Practitioner | Admitting: Nurse Practitioner

## 2023-02-25 ENCOUNTER — Encounter: Payer: Self-pay | Admitting: *Deleted

## 2023-02-25 DIAGNOSIS — M170 Bilateral primary osteoarthritis of knee: Secondary | ICD-10-CM

## 2023-02-25 DIAGNOSIS — M25462 Effusion, left knee: Secondary | ICD-10-CM | POA: Diagnosis not present

## 2023-02-25 DIAGNOSIS — M25461 Effusion, right knee: Secondary | ICD-10-CM | POA: Diagnosis not present

## 2023-02-25 NOTE — ED Provider Notes (Signed)
[facility]    CSN: [id number] Arrival date & time: 02/25/23  1617      History   Chief Complaint No chief complaint on file.   HPI Start [name] is a 89 y.o. female.   The history is provided by the patient and a parent.   The patient presents for complaints of bilateral knee pain has been present for quite some time, but worsened over the last 3-1/2 weeks.  [name] states over the past 24 to 48 hours, patient has been unable to walk or put pressure on her legs.  [name] denies any recent falls, injury, or trauma.  The patient's daughter denies numbness, tingling, or bruising to the knees.  Patient has a history of bilateral osteoarthritis, she is not a candidate for surgery given her underlying health history.  [name] states that the patient use Voltaren gel, but that she had a "burning" sensation to her knees, and the patient will not use it anymore.  [name] states that she has been elevating her mother's legs, massaging them, and applying ice.  Patient states that she has also been taking Tylenol for her symptoms.  The patient was given a steroid injection at her appointment with orthopedics on 5/21, but patient's daughter states the patient did not experience any relief of her symptoms.  Past Medical History:  Diagnosis Date   Arthritis    CKD (chronic kidney disease), stage III (HCC)    Complete heart block (HCC) 11/16/2016   a. transient during NSTEMI, resolved with revascularization.   Coronary artery disease 11/2009   a. with prior stent placement to the ramus intermedius and RCA. b. NSTEMI 11/2016 s/p DES to DES to distal Cx with moderate residual dz.   CVA (cerebral vascular accident) [facility])    Essential hypertension    Mixed hyperlipidemia    Non-ST elevation (NSTEMI) myocardial infarction (HCC) 11/16/2016   Obesity    Osteoarthritis    Reflux esophagitis    Sleep apnea 09/27/2010   Untreated, REM 64.7/hr AHI  18.5/hr RDI 19.2/hr. Patuent refused CPAP therapy    Patient Active Problem List   Diagnosis Date Noted   Bilateral primary osteoarthritis of knee 11/01/2022   Encounter for general adult medical examination with abnormal findings 11/01/2022   Chronic kidney disease, stage III (moderate) (HCC) 08/21/2022   Left pontine stroke (HCC) 11/06/2021   Dyslipidemia 11/06/2021   GERD without esophagitis 11/06/2021   CKD (chronic kidney disease), stage III (HCC) 11/23/2016   Pericardial effusion 11/23/2016   Complete heart block (HCC) 11/16/2016   Non-ST elevation (NSTEMI) myocardial infarction (HCC) 11/16/2016   Renal insufficiency 11/03/2015   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 09/06/2015   Uncontrolled hypertension 08/31/2015   HTN (hypertension), malignant    Symptomatic bradycardia    Hyperlipidemia    Epigastric fullness    Near syncope 08/28/2015   CAD S/P PCI    Essential hypertension 03/05/2013   Class 1 obesity 03/05/2013   Hyperlipidemia with target LDL less than 70 03/05/2013   Obstructive sleep apnea 03/05/2013    Past Surgical History:  Procedure Laterality Date   CHOLECYSTECTOMY     CORONARY ANGIOPLASTY WITH STENT PLACEMENT  2007   A 3.0x41mm CYPHER stent post dilated to 3.29 mm the 100% occlusion was reduced to 0%   CORONARY STENT INTERVENTION N/A 11/16/2016   Procedure: Coronary Stent Intervention;  Surgeon: Tonny Bollman, MD;  Location: [facility] INVASIVE CV LAB;  Service: Cardiovascular;  Laterality: N/A;   LEFT HEART CATH AND CORONARY ANGIOGRAPHY N/A 11/16/2016   Procedure: Left Heart Cath and Coronary Angiography;  Surgeon: Tonny Bollman, MD;  Location: [facility] INVASIVE CV LAB;  Service: Cardiovascular;  Laterality: N/A;   LEFT HEART CATHETERIZATION WITH CORONARY ANGIOGRAM N/A 07/12/2014   Procedure: LEFT HEART CATHETERIZATION WITH CORONARY ANGIOGRAM;  Surgeon: Micheline Chapman, MD;  Location: [facility] CATH LAB;  Service: Cardiovascular;  Laterality:  N/A;   TEMPORARY PACEMAKER N/A 11/16/2016   Procedure: Temporary Pacemaker;  Surgeon: Tonny Bollman, MD;  Location: [facility] INVASIVE CV LAB;  Service: Cardiovascular;  Laterality: N/A;    OB History   No obstetric history on file.      Home Medications    Prior to Admission medications   Medication Sig Start Date End Date Taking? Authorizing Provider  acetaminophen (TYLENOL) 500 MG tablet Take 1,000 mg by mouth every 6 (six) hours as needed for mild pain or moderate pain.    [provider]  amLODipine (NORVASC) 5 MG tablet TAKE 1 TABLET BY MOUTH EVERY DAY 04/05/22   Jonelle Sidle, MD  clopidogrel (PLAVIX) 75 MG tablet TAKE 1 TABLET BY MOUTH EVERY DAY 12/13/22   Jonelle Sidle, MD  fluticasone [facility]) 50 MCG/ACT nasal spray Place 2 sprays into both nostrils daily. Patient taking differently: Place 2 sprays into both nostrils daily as needed. 09/05/22   Yaritsa Savarino-Warren, Sadie Haber, NP  hydrALAZINE (APRESOLINE) 25 MG tablet Take 1 tablet (25 mg total) by mouth in the morning and at bedtime. 11/21/22 11/16/23  Jonelle Sidle, MD  losartan (COZAAR) 100 MG tablet Take 100 mg by mouth daily. 12/25/22 12/25/23  [provider]  Menthol, Topical Analgesic, (BIOFREEZE ROLL-ON) 4 % GEL Apply 1 application. topically daily as needed (pain).    [provider]  metoprolol succinate (TOPROL XL) 25 MG 24 hr tablet Take 0.5 tablets (12.5 mg total) by mouth daily. 02/11/23   Sharlene Dory, NP  nitroGLYCERIN (NITROSTAT) 0.4 MG SL tablet PLACE 1 TABLET UNDER THE TONGUE EVERY 5 MINUTES AS NEEDED. 11/21/22   Jonelle Sidle, MD  pantoprazole (PROTONIX) 40 MG tablet TAKE 1 TABLET BY MOUTH EVERY DAY 12/21/22   Jonelle Sidle, MD  rosuvastatin (CRESTOR) 40 MG tablet TAKE 1 TABLET BY MOUTH EVERY DAY 12/14/22   Jonelle Sidle, MD    Family History Family History  Problem Relation Age of Onset   Hypertension Mother    Aneurysm Mother        died at age 63   Other Father         unsure of medical history   Hypertension Other     Social History Social History   Tobacco Use   Smoking status: Never   Smokeless tobacco: Never  Vaping Use   Vaping Use: Never used  Substance Use Topics   Alcohol use: No   Drug use: No     Allergies   Atorvastatin, Contrast media [iodinated contrast media], Darvon [propoxyphene], and Codeine   Review of Systems Review of Systems Per HPI  Physical Exam Triage Vital Signs ED Triage Vitals  Enc Vitals Group     BP 02/25/23 1627 126/76     Pulse Rate 02/25/23 1627 91     Resp 02/25/23 1627 18     Temp 02/25/23 1627 99.1 F (37.3 C)     Temp Source 02/25/23 1627 Oral     SpO2 02/25/23 1627 93 %     Weight --  Height --      Head Circumference --      Peak Flow --      Pain Score 02/25/23 1632 10     Pain Loc --      Pain Edu? --      Excl. in GC? --    No data found.  Updated Vital Signs BP 126/76 (BP Location: Left Arm)   Pulse 91   Temp 99.1 F (37.3 C) (Oral)   Resp 18   SpO2 93%   Visual Acuity Right Eye Distance:   Left Eye Distance:   Bilateral Distance:    Right Eye Near:   Left Eye Near:    Bilateral Near:     Physical Exam Vitals and nursing note reviewed.  HENT:     Head: Normocephalic.  Eyes:     Extraocular Movements: Extraocular movements intact.     Pupils: Pupils are equal, round, and reactive to light.  Cardiovascular:     Pulses: Normal pulses.     Heart sounds: Normal heart sounds.     Comments: Edema noted to the bilateral feet. Pulmonary:     Effort: Pulmonary effort is normal.     Breath sounds: Normal breath sounds.  Abdominal:     General: Bowel sounds are normal.     Palpations: Abdomen is soft.  Musculoskeletal:     Cervical back: Normal range of motion.     Right knee: Effusion present. No deformity, erythema or ecchymosis. Decreased range of motion. Tenderness present.     Left knee: Effusion present. No deformity, erythema or ecchymosis. Decreased  range of motion. Tenderness present.     Right lower leg: Edema present.     Left lower leg: Edema present.  Neurological:     General: No focal deficit present.  Psychiatric:        Mood and Affect: Mood normal.        Behavior: Behavior normal.      UC Treatments / Results  Labs (all labs ordered are listed, but only abnormal results are displayed) Labs Reviewed - No data to display  EKG   Radiology No results found.  Procedures Procedures (including critical care time)  Medications Ordered in UC Medications - No data to display  Initial Impression / Assessment and Plan / UC Course  I have reviewed the triage vital signs and the nursing notes.  Pertinent labs & imaging results that were available during my care of the patient were reviewed by me and considered in my medical decision making (see chart for details).  The patient is well-appearing, she is in no acute distress, vital signs are stable.  Patient with long history of osteoarthritis.  On exam, she has moderate bilateral effusions present.  There has been no new injury, fall, or trauma.  Symptoms appear to be related to the underlying history of osteoarthritis.  Lengthy discussion with the patient and her daughter. [name] was advised to follow-up with orthopedics for further evaluation and for possible drainage of the fluid.  Supportive care recommendations were provided and discussed with the patient and her daughter to include use of the Ace wrap's to help with swelling and to provide compression and support, continue over-the-counter Tylenol arthritis strength tablets, and to continue RICE therapy.  [name] was also advised to allow the patient to use the rollator walker in which she can sit and move around her home to prevent falls or injury.  [name] is in agreement with  this plan of care and verbalizes understanding.  All questions were answered.  Patient stable for  discharge.  Final Clinical Impressions(s) / UC Diagnoses   Final diagnoses:  Bilateral knee effusions  Bilateral primary osteoarthritis of knee     Discharge Instructions      Continue Tylenol arthritis strength 650 mg tablets every 8 hours as needed for knee pain or discomfort. Continue the application of ice to the knees to help with pain and swelling.  Apply for 20 minutes, remove for 1 hour, then repeat as needed. Continue elevation of the lower extremities above the level of the heart is much as possible to help decrease swelling and edema. Use the Ace wrap's to help decrease swelling and to provide compression and support. As discussed, I would like for you to follow-up with her orthopedist for further evaluation.  It is also recommended that you follow-up with her cardiologist to ensure the appropriate medication therapy. Follow-up as needed.     ED Prescriptions   None    PDMP not reviewed this encounter.   Abran Cantor, NP 02/25/23 1725

## 2023-02-25 NOTE — Telephone Encounter (Signed)
Waiting on mychart message reply from daughter.

## 2023-02-25 NOTE — Discharge Instructions (Signed)
Continue Tylenol arthritis strength 650 mg tablets every 8 hours as needed for knee pain or discomfort. Continue the application of ice to the knees to help with pain and swelling.  Apply for 20 minutes, remove for 1 hour, then repeat as needed. Continue elevation of the lower extremities above the level of the heart is much as possible to help decrease swelling and edema. Use the Ace wrap's to help decrease swelling and to provide compression and support. As discussed, I would like for you to follow-up with her orthopedist for further evaluation.  It is also recommended that you follow-up with her cardiologist to ensure the appropriate medication therapy. Follow-up as needed.

## 2023-02-25 NOTE — ED Triage Notes (Signed)
Pt reports her legs and feet are swollen making it hard to put pressure on them x 3.5 weeks.   Used voltaren gel after her provider gave her shot and it seems like the pain is worsening. Pt states she is in pain and getting hot,cold, and weak.

## 2023-03-02 ENCOUNTER — Encounter (HOSPITAL_COMMUNITY): Payer: Self-pay | Admitting: Emergency Medicine

## 2023-03-02 ENCOUNTER — Other Ambulatory Visit: Payer: Self-pay

## 2023-03-02 ENCOUNTER — Emergency Department (HOSPITAL_COMMUNITY): Payer: Medicare HMO

## 2023-03-02 ENCOUNTER — Emergency Department (HOSPITAL_COMMUNITY)
Admission: EM | Admit: 2023-03-02 | Discharge: 2023-03-02 | Disposition: A | Discharge status: Home / Self Care | Payer: Medicare HMO | Attending: Emergency Medicine | Admitting: Emergency Medicine

## 2023-03-02 DIAGNOSIS — Z79899 Other long term (current) drug therapy: Secondary | ICD-10-CM | POA: Diagnosis not present

## 2023-03-02 DIAGNOSIS — R531 Weakness: Secondary | ICD-10-CM | POA: Insufficient documentation

## 2023-03-02 DIAGNOSIS — M25562 Pain in left knee: Secondary | ICD-10-CM | POA: Insufficient documentation

## 2023-03-02 DIAGNOSIS — E1122 Type 2 diabetes mellitus with diabetic chronic kidney disease: Secondary | ICD-10-CM | POA: Insufficient documentation

## 2023-03-02 DIAGNOSIS — Z7902 Long term (current) use of antithrombotics/antiplatelets: Secondary | ICD-10-CM | POA: Insufficient documentation

## 2023-03-02 DIAGNOSIS — K449 Diaphragmatic hernia without obstruction or gangrene: Secondary | ICD-10-CM | POA: Insufficient documentation

## 2023-03-02 DIAGNOSIS — N3000 Acute cystitis without hematuria: Secondary | ICD-10-CM | POA: Diagnosis not present

## 2023-03-02 DIAGNOSIS — G8929 Other chronic pain: Secondary | ICD-10-CM | POA: Diagnosis not present

## 2023-03-02 DIAGNOSIS — R29898 Other symptoms and signs involving the musculoskeletal system: Secondary | ICD-10-CM | POA: Diagnosis not present

## 2023-03-02 DIAGNOSIS — N189 Chronic kidney disease, unspecified: Secondary | ICD-10-CM | POA: Insufficient documentation

## 2023-03-02 DIAGNOSIS — M25462 Effusion, left knee: Secondary | ICD-10-CM | POA: Diagnosis not present

## 2023-03-02 DIAGNOSIS — I129 Hypertensive chronic kidney disease with stage 1 through stage 4 chronic kidney disease, or unspecified chronic kidney disease: Secondary | ICD-10-CM | POA: Diagnosis not present

## 2023-03-02 DIAGNOSIS — I442 Atrioventricular block, complete: Secondary | ICD-10-CM | POA: Insufficient documentation

## 2023-03-02 DIAGNOSIS — M25561 Pain in right knee: Secondary | ICD-10-CM | POA: Diagnosis not present

## 2023-03-02 DIAGNOSIS — M1712 Unilateral primary osteoarthritis, left knee: Secondary | ICD-10-CM | POA: Diagnosis not present

## 2023-03-02 DIAGNOSIS — M25461 Effusion, right knee: Secondary | ICD-10-CM | POA: Diagnosis not present

## 2023-03-02 DIAGNOSIS — M1711 Unilateral primary osteoarthritis, right knee: Secondary | ICD-10-CM | POA: Diagnosis not present

## 2023-03-02 LAB — COMPREHENSIVE METABOLIC PANEL
ALT: 13 U/L (ref 0–44)
AST: 25 U/L (ref 15–41)
Albumin: 2.8 g/dL — ABNORMAL LOW (ref 3.5–5.0)
Alkaline Phosphatase: 73 U/L (ref 38–126)
Anion gap: 7 (ref 5–15)
BUN: 19 mg/dL (ref 8–23)
CO2: 24 mmol/L (ref 22–32)
Calcium: 9.1 mg/dL (ref 8.9–10.3)
Chloride: 108 mmol/L (ref 98–111)
Creatinine, Ser: 1.45 mg/dL — ABNORMAL HIGH (ref 0.44–1.00)
GFR, Estimated: 35 mL/min — ABNORMAL LOW (ref >60)
Glucose, Bld: 118 mg/dL — ABNORMAL HIGH (ref 70–99)
Potassium: 4.3 mmol/L (ref 3.5–5.1)
Sodium: 139 mmol/L (ref 135–145)
Total Bilirubin: 0.7 mg/dL (ref 0.3–1.2)
Total Protein: 6.3 g/dL — ABNORMAL LOW (ref 6.5–8.1)

## 2023-03-02 LAB — URINALYSIS, ROUTINE W REFLEX MICROSCOPIC
Bilirubin Urine: NEGATIVE
Glucose, UA: NEGATIVE mg/dL
Hgb urine dipstick: NEGATIVE
Ketones, ur: NEGATIVE mg/dL
Nitrite: POSITIVE — AB
Protein, ur: 30 mg/dL — AB
Specific Gravity, Urine: 1.015 (ref 1.005–1.030)
pH: 5 (ref 5.0–8.0)

## 2023-03-02 LAB — CBC WITH DIFFERENTIAL/PLATELET
Abs Immature Granulocytes: 0.02 10*3/uL (ref 0.00–0.07)
Basophils Absolute: 0.1 10*3/uL (ref 0.0–0.1)
Basophils Relative: 1 %
Eosinophils Absolute: 0.1 10*3/uL (ref 0.0–0.5)
Eosinophils Relative: 2 %
HCT: 37.1 % (ref 36.0–46.0)
Hemoglobin: 11.9 g/dL — ABNORMAL LOW (ref 12.0–15.0)
Immature Granulocytes: 0 %
Lymphocytes Relative: 20 %
Lymphs Abs: 1.3 10*3/uL (ref 0.7–4.0)
MCH: 28.6 pg (ref 26.0–34.0)
MCHC: 32.1 g/dL (ref 30.0–36.0)
MCV: 89.2 fL (ref 80.0–100.0)
Monocytes Absolute: 0.8 10*3/uL (ref 0.1–1.0)
Monocytes Relative: 13 %
Neutro Abs: 4.2 10*3/uL (ref 1.7–7.7)
Neutrophils Relative %: 64 %
Platelets: 220 10*3/uL (ref 150–400)
RBC: 4.16 MIL/uL (ref 3.87–5.11)
RDW: 15.1 % (ref 11.5–15.5)
WBC: 6.5 10*3/uL (ref 4.0–10.5)
nRBC: 0 % (ref 0.0–0.2)

## 2023-03-02 LAB — BRAIN NATRIURETIC PEPTIDE: B Natriuretic Peptide: 58 pg/mL (ref 0.0–100.0)

## 2023-03-02 MED ORDER — BUPIVACAINE HCL (PF) 0.5 % IJ SOLN
10.0000 mL | Freq: Once | INTRAMUSCULAR | Status: AC
  Administered 2023-03-02: 10 mL
  Filled 2023-03-02: qty 30

## 2023-03-02 MED ORDER — GABAPENTIN 300 MG PO CAPS: 300.0000 mg | ORAL_CAPSULE | Freq: Three times a day (TID) | ORAL | 0 refills | Status: DC

## 2023-03-02 MED ORDER — ONDANSETRON HCL 4 MG/2ML IJ SOLN: 4.0000 mg | Freq: Once | INTRAMUSCULAR | Status: DC

## 2023-03-02 MED ORDER — CEPHALEXIN 500 MG PO CAPS: 500.0000 mg | ORAL_CAPSULE | Freq: Two times a day (BID) | ORAL | 0 refills | Status: AC

## 2023-03-02 MED ORDER — TRIAMCINOLONE ACETONIDE 40 MG/ML IJ SUSP
80.0000 mg | Freq: Once | INTRAMUSCULAR | Status: AC
  Administered 2023-03-02: 80 mg via INTRA_ARTICULAR
  Filled 2023-03-02: qty 2

## 2023-03-02 NOTE — ED Triage Notes (Signed)
Pt reports bilateral leg weakness and pain. PT also reports rash under her left breast.

## 2023-03-02 NOTE — ED Provider Notes (Signed)
[place] EMERGENCY DEPARTMENT AT [facility] Provider Note   CSN: [id number] Arrival date & time: 03/02/23  4332     History  Chief Complaint  Patient presents with  . Leg Pain    [name] is a 89 y.o. female.  HPI Patient presents with her daughter who assists with history.  Patient has multiple medical problems, including CKD, bilateral knee arthritis, obesity, hypertension.  She presents today with concern for increasing generalized weakness and difficulty performing ADL.  This exacerbation seems to begin about 4 days ago, and been progressive with associated increased soreness in her left knee.  Patient has a history of bilateral knee injections within the past month. 2 days ago she went to urgent care, and today presents with the above concerns.     Home Medications Prior to Admission medications   Medication Sig Start Date End Date Taking? Authorizing Provider  acetaminophen (TYLENOL) 500 MG tablet Take 1,000 mg by mouth every 6 (six) hours as needed for mild pain or moderate pain.    [provider]  amLODipine (NORVASC) 5 MG tablet TAKE 1 TABLET BY MOUTH EVERY DAY 04/05/22   Jonelle Sidle, MD  clopidogrel (PLAVIX) 75 MG tablet TAKE 1 TABLET BY MOUTH EVERY DAY 12/13/22   Jonelle Sidle, MD  fluticasone [place]) 50 MCG/ACT nasal spray Place 2 sprays into both nostrils daily. Patient taking differently: Place 2 sprays into both nostrils daily as needed. 09/05/22   Leath-Warren, Sadie Haber, NP  hydrALAZINE (APRESOLINE) 25 MG tablet Take 1 tablet (25 mg total) by mouth in the morning and at bedtime. 11/21/22 11/16/23  Jonelle Sidle, MD  losartan (COZAAR) 100 MG tablet Take 100 mg by mouth daily. 12/25/22 12/25/23  [provider]  Menthol, Topical Analgesic, (BIOFREEZE ROLL-ON) 4 % GEL Apply 1 application. topically daily as needed (pain).    [provider]  metoprolol succinate (TOPROL XL) 25 MG 24 hr tablet Take 0.5 tablets  (12.5 mg total) by mouth daily. 02/11/23   Sharlene Dory, NP  nitroGLYCERIN (NITROSTAT) 0.4 MG SL tablet PLACE 1 TABLET UNDER THE TONGUE EVERY 5 MINUTES AS NEEDED. 11/21/22   Jonelle Sidle, MD  pantoprazole (PROTONIX) 40 MG tablet TAKE 1 TABLET BY MOUTH EVERY DAY 12/21/22   Jonelle Sidle, MD  rosuvastatin (CRESTOR) 40 MG tablet TAKE 1 TABLET BY MOUTH EVERY DAY 12/14/22   Jonelle Sidle, MD      Allergies    Atorvastatin, Contrast media [iodinated contrast media], Darvon [propoxyphene], and Codeine    Review of Systems   Review of Systems  All other systems reviewed and are negative.   Physical Exam Updated Vital Signs BP (!) 135/97   Pulse 86   Temp 98.1 F (36.7 C)   Resp 19   SpO2 95%  Physical Exam Vitals and nursing note reviewed.  Constitutional:      General: She is not in acute distress.    Appearance: She is well-developed. She is obese. She is not ill-appearing.  HENT:     Head: Normocephalic and atraumatic.  Eyes:     Conjunctiva/sclera: Conjunctivae normal.  Cardiovascular:     Rate and Rhythm: Normal rate and regular rhythm.  Pulmonary:     Effort: Pulmonary effort is normal. No respiratory distress.     Breath sounds: Normal breath sounds. No stridor.  Abdominal:     General: There is no distension.  Musculoskeletal:       Legs:  Skin:  General: Skin is warm and dry.       Neurological:     Mental Status: She is alert and oriented to person, place, and time.     Cranial Nerves: No cranial nerve deficit.  Psychiatric:        Mood and Affect: Mood normal.    ED Results / Procedures / Treatments   Labs (all labs ordered are listed, but only abnormal results are displayed) Labs Reviewed  COMPREHENSIVE METABOLIC PANEL - Abnormal; Notable for the following components:      Result Value   Glucose, Bld 118 (*)    Creatinine, Ser 1.45 (*)    Total Protein 6.3 (*)    Albumin 2.8 (*)    GFR, Estimated 35 (*)    All other components within  normal limits  CBC WITH DIFFERENTIAL/PLATELET - Abnormal; Notable for the following components:   Hemoglobin 11.9 (*)    All other components within normal limits  BRAIN NATRIURETIC PEPTIDE  URINALYSIS, ROUTINE W REFLEX MICROSCOPIC    EKG EKG Interpretation  Date/Time:  Saturday March 02 2023 11:27:35 EDT Ventricular Rate:  76 PR Interval:  204 QRS Duration: 124 QT Interval:  423 QTC Calculation: 476 R Axis:   -62 Text Interpretation: Sinus rhythm RBBB and LAFB Confirmed by Gerhard Munch [phone]) on 03/02/2023 11:34:11 AM  Radiology DG Knee Complete 4 Views Right  Result Date: 03/02/2023 CLINICAL DATA:  Pain in right knee. EXAM: RIGHT KNEE - COMPLETE 4+ VIEW COMPARISON:  12/03/2022 FINDINGS: Small suprapatellar joint effusion. No signs of acute fracture or dislocation. Severe tricompartment osteoarthritis is identified with marked joint space narrowing, marginal spur formation as well as subchondral sclerosis. Loose bodies identified within the posterior joint space. IMPRESSION: 1. Small suprapatellar joint effusion. 2. Severe tricompartment osteoarthritis. 3. Loose bodies within the posterior joint space. Electronically Signed   By: Signa Kell M.D.   On: 03/02/2023 12:15   DG Knee Complete 4 Views Left  Result Date: 03/02/2023 CLINICAL DATA:  Left knee pain. EXAM: LEFT KNEE - COMPLETE 4+ VIEW COMPARISON:  December 03, 2022. FINDINGS: No fracture or dislocation is noted. Small suprapatellar joint effusion is noted. Vascular calcifications are noted. Severe narrowing of medial joint space is noted. Moderate narrowing of patellofemoral and lateral joint spaces are noted. IMPRESSION: Moderate to severe tricompartmental degenerative joint disease. Small suprapatellar joint effusion. No fracture or dislocation. Electronically Signed   By: Lupita Raider M.D.   On: 03/02/2023 12:00   DG Chest 2 View  Result Date: 03/02/2023 CLINICAL DATA:  Bilateral lower extremity weakness. EXAM: CHEST - 2  VIEW COMPARISON:  June 05, 2020. FINDINGS: Stable cardiomediastinal silhouette. Moderate size hiatal hernia. Both lungs are clear. The visualized skeletal structures are unremarkable. IMPRESSION: No active cardiopulmonary disease.  Moderate size hiatal hernia. Electronically Signed   By: Lupita Raider M.D.   On: 03/02/2023 11:59    Procedures .Joint Aspiration/Arthrocentesis  Date/Time: 03/02/2023 2:30 PM  Performed by: Gerhard Munch, MD Authorized by: Gerhard Munch, MD   Consent:    Consent obtained:  Verbal   Consent given by:  Patient   Risks, benefits, and alternatives were discussed: yes     Risks discussed:  Bleeding, infection and pain   Alternatives discussed:  Alternative treatment Universal protocol:    Procedure explained and questions answered to patient or proxy's satisfaction: yes     Relevant documents present and verified: yes     Test results available: yes     Imaging studies  available: yes     Required blood products, implants, devices, and special equipment available: yes     Site/side marked: yes     Immediately prior to procedure, a time out was called: yes     Patient identity confirmed:  Verbally with patient Location:    Location:  Knee   Knee:  L knee Anesthesia:    Anesthesia method:  Local infiltration   Local anesthetic:  Bupivacaine 0.5% w/o epi Procedure details:    Preparation: Patient was prepped and draped in usual sterile fashion     Needle gauge:  18 G   Ultrasound guidance: no     Approach:  Medial   Aspirate amount:  35   Aspirate characteristics:  Yellow   Steroid injected: yes     Specimen collected: no   Post-procedure details:    Dressing:  Adhesive bandage   Procedure completion:  Tolerated .Joint Aspiration/Arthrocentesis  Date/Time: 03/02/2023 2:50 PM  Performed by: Gerhard Munch, MD Authorized by: Gerhard Munch, MD   Consent:    Consent obtained:  Verbal   Consent given by:  Patient   Risks, benefits,  and alternatives were discussed: yes     Risks discussed:  Bleeding, infection and pain   Alternatives discussed:  Alternative treatment Universal protocol:    Procedure explained and questions answered to patient or proxy's satisfaction: yes     Relevant documents present and verified: yes     Test results available: yes     Imaging studies available: yes     Required blood products, implants, devices, and special equipment available: yes     Site/side marked: yes     Immediately prior to procedure, a time out was called: yes     Patient identity confirmed:  Verbally with patient Location:    Location:  Knee   Knee:  R knee Anesthesia:    Anesthesia method:  Local infiltration   Local anesthetic:  Bupivacaine 0.5% w/o epi Procedure details:    Preparation: Patient was prepped and draped in usual sterile fashion     Needle gauge:  18 G   Ultrasound guidance: no     Approach:  Lateral   Aspirate amount:  40   Aspirate characteristics:  Yellow   Steroid injected: no     Specimen collected: no   Post-procedure details:    Dressing:  Adhesive bandage   Procedure completion:  Tolerated     Medications Ordered in ED Medications  triamcinolone acetonide (KENALOG-40) injection 80 mg (has no administration in time range)  bupivacaine(PF) (MARCAINE) 0.5 % injection 10 mL (has no administration in time range)    ED Course/ Medical Decision Making/ A&P                             Medical Decision Making Adult female with multiple medical issues with obesity, hypertension, diabetes, presents with generalized weakness, worsening knee pain, difficulty performing ADL.  Patient is awake, alert, afebrile, not hypotensive or hypoxic, considerations of pneumonia, bacteremia, sepsis are noted, but unlikely. Some suspicion for progression of chronic disease.  Knee pain may be secondary to acute on chronic osteoarthritis, less likely septic arthritis. Patient had labs x-ray  monitoring. Cardiac 90 sinus normal Pulse ox 100% room air normal   Amount and/or Complexity of Data Reviewed Independent Historian: caregiver External Data Reviewed: notes.    Details: I reviewed the Ortho notes from earlier, as well as urgent care from  this week. Labs: ordered. Decision-making details documented in ED Course. Radiology: ordered and independent interpretation performed. Decision-making details documented in ED Course. ECG/medicine tests: ordered and independent interpretation performed. Decision-making details documented in ED Course.  Risk Prescription drug management. Decision regarding hospitalization. Diagnosis or treatment significantly limited by social determinants of health.  Cardiac notes below: Echocardiogram 08/2022: 1. Left ventricular ejection fraction, by estimation, is 70 to 75%. The  left ventricle has hyperdynamic function. The left ventricle has no  regional wall motion abnormalities. There is severe concentric left  ventricular hypertrophy. Left ventricular  diastolic parameters are consistent with Grade I diastolic dysfunction  (impaired relaxation).   2. Right ventricular systolic function is normal. The right ventricular  size is normal. There is normal pulmonary artery systolic pressure. The  estimated right ventricular systolic pressure is 24.0 mmHg.   3. Left atrial size was mildly dilated.   4. The mitral valve is grossly normal. Mild mitral valve regurgitation.   5. The aortic valve is tricuspid. Aortic valve regurgitation is mild.  Aortic valve sclerosis is present, with no evidence of aortic valve  stenosis. Aortic valve mean gradient measures 5.0 mmHg.   6. Agitated saline contrast bubble study was negative, with no evidence  of right to left interatrial shunt.   Comparison(s): No significant change from prior study. Prior images  reviewed side by side.   Carotid Doppler 08/2022: MPRESSION: 1. Right carotid artery system:  Patent without significant atherosclerotic plaque formation.   2. Left carotid artery system: Patent without significant atherosclerotic plaque formation.   3.  Vertebral artery system: Patent with antegrade flow bilaterally.   Cardiac monitor 12/2021: [employer] monitor reviewed. 30 days analyzed. Predominant rhythm is sinus with heart rate ranging from 56 bpm up to 128 bpm and average heart rate 75 bpm. Frequent PACs were noted representing approximately 8% total beats. There were rare PVCs representing less than 1% total beats. Episodes of NSVT were also noted ranging from 4 beats to 8 beats, none were sustained. No atrial fibrillation was observed. There were no pauses.  Ortho notes:  Patient is an 89 year old woman with osteoarthritis of both knees.  Patient has completed a course of low-dose prednisone.  She is status post previous steroid injections which have temporarily helped.   Assessment & Plan: Visit Diagnoses:  1. Chronic pain of both knees       Plan: Both knees were injected she tolerated this well recommend that she could try Voltaren gel as well. Discussed total knee arthroplasty postoperative care.  Patient states she is not interested in total knee arthroplasty.     Follow-Up Instructions: No follow-ups on file.    Ortho Exam   Patient is alert, oriented, no adenopathy, well-dressed, normal affect, normal respiratory effort. Examination patient is ambulating in a wheelchair.  She has crepitation with range of motion of both knees there is no effusion she is tender to palpation in the patellofemoral joint as well as medial and lateral joint line.  2:57 PM I reviewed the patient's x-rays, labs and I lengthy conversation the patient and her daughter.  We discussed options for attempts at a relief given her well-documented bilateral degenerative knee condition, worsening pain today.  She is afebrile, labs are consistent with multiple prior studies, there is little  evidence for new infection.  We discussed new medications, the patient will attempt relief with gabapentin.  She was previously prescribed this medication, but never tried it, after family became aware of listed adverse reactions.  Patient did request for injection bilaterally.  This was performed, see procedure above.  Patient discharged with ongoing steroids, gabapentin, to follow-up with Ortho, primary care.  On signout the patient is awaiting urinalysis as well.         Final Clinical Impression(s) / ED Diagnoses Final diagnoses:  Chronic pain of both knees  Weakness     Gerhard Munch, MD 03/02/23 1506

## 2023-03-02 NOTE — ED Provider Notes (Signed)
  Physical Exam  BP (!) 135/97   Pulse 86   Temp 98.1 F (36.7 C)   Resp 19   SpO2 95%   Physical Exam  Procedures  Procedures  ED Course / MDM   Clinical Course as of 03/02/23 1605  Sat Mar 02, 2023  1536 Assumed care from Dr. Jeraldine Loots.  89 yo F with history of osteoarthritis, CKD, obesity, and hypertension BL knee pain and generalized weakness. Aspirated both knees here for symptomatic relief.  Thought that it is likely due to osteoarthritis. Pending urinalysis for her generalized weakness.  Did have an EKG that did not show any ischemic changes.  Not currently having any urinary symptoms [RP]  1603 Urinalysis has returned and is consistent with urinary tract infection.  Prior urine cultures show that she has grown E. coli and Klebsiella that are both susceptible to cephalosporins.  Was sent home on a 7-day course of Keflex.  Will have her follow-up with her primary doctor in several days as well as orthopedics. [RP]    Clinical Course User Index [RP] Rondel Baton, MD   Medical Decision Making Amount and/or Complexity of Data Reviewed Labs: ordered. Radiology: ordered. ECG/medicine tests: ordered.  Risk Prescription drug management.      Rondel Baton, MD 03/02/23 [phone]

## 2023-03-02 NOTE — Discharge Instructions (Addendum)
Please be sure to follow-up with your primary care physician and your orthopedist for ongoing management of your knee pain.  You have received medications today that should assist with pain relief.  Discuss this with your physicians.  You are found to have a urinary tract infection so please take the Keflex we have prescribed you to treat it.

## 2023-03-04 LAB — URINE CULTURE: Culture: >=100000 — AB

## 2023-03-05 LAB — URINE CULTURE

## 2023-03-06 ENCOUNTER — Telehealth (HOSPITAL_BASED_OUTPATIENT_CLINIC_OR_DEPARTMENT_OTHER): Payer: Self-pay

## 2023-03-06 NOTE — Telephone Encounter (Signed)
Post ED Visit - Positive Culture Follow-up  Culture report reviewed by antimicrobial stewardship pharmacist: [facility] []  Enzo Bi, Pharm.D. []  Celedonio Miyamoto, Pharm.D., BCPS AQ-ID [x]  Wilburn Cornelia, Pharm.D., BCPS []  Georgina Pillion, [address].D., BCPS []  [place], [address].D., BCPS, AAHIVP []  Estella Husk, Pharm.D., BCPS, AAHIVP []  Lysle Pearl, PharmD, BCPS []  Phillips Climes, PharmD, BCPS []  Agapito Games, PharmD, BCPS []  Verlan Friends, PharmD []  Mervyn Gay, PharmD, BCPS []  Vinnie Level, PharmD  [facility] []  Len Childs, PharmD []  Greer Pickerel, PharmD []  Adalberto Cole, PharmD []  Perlie Gold, Rph []  Lonell Face) Jean Rosenthal, PharmD []  Earl Many, PharmD []  Junita Push, PharmD []  Dorna Leitz, PharmD []  Terrilee Files, PharmD []  Lynann Beaver, PharmD []  Keturah Barre, PharmD []  Loralee Pacas, PharmD []  Bernadene Person, PharmD   Positive urine culture Treated with Cephalexin, organism sensitive to the same and no further patient follow-up is required at this time.  Sandria Senter 03/06/2023, 4:42 PM

## 2023-03-07 ENCOUNTER — Telehealth: Payer: Self-pay

## 2023-03-07 NOTE — Transitions of Care (Post Inpatient/ED Visit) (Signed)
03/07/2023  Name: [name] MRN: [id number] DOB: [birth year]  Today's TOC FU Call Status: Today's TOC FU Call Status:: Successful TOC FU Call Competed TOC FU Call Complete Date: 03/07/23  Transition Care Management Follow-up Telephone Call Date of Discharge: 03/02/23 [facility] Type of Discharge: Emergency Department Reason for ED Visit: Orthopedic Conditions Orthopedic/Injury Diagnosis:  (Bilateral Knee Pain and UTI) How have you been since you were released from the hospital?: Better Any questions or concerns?: No  Items Reviewed: Did you receive and understand the discharge instructions provided?: Yes Medications obtained,verified, and reconciled?: Partial Review Completed Reason for Partial Mediation Review: Discussed with patient her antibiotic Any new allergies since your discharge?: No Dietary orders reviewed?: No Do you have support at home?: Yes People in Home: child(ren), adult Name of Support/Comfort Primary Source: Anna  Medications Reviewed Today: Medications Reviewed Today     Reviewed by Jodelle Gross, RN (Case Manager) on 03/07/23 at 1619  Med List Status: <None>   Medication Order Taking? Sig Documenting Provider Last Dose Status Informant  acetaminophen (TYLENOL) 500 MG tablet [id number] Yes Take 1,000 mg by mouth every 6 (six) hours as needed for mild pain or moderate pain. [provider] Taking Active Child, Pharmacy Records  amLODipine (NORVASC) 5 MG tablet [id number]  TAKE 1 TABLET BY MOUTH EVERY DAY Jonelle Sidle, MD  Active Child, Pharmacy Records  cephALEXin [facility]) 500 MG capsule [id number] Yes Take 1 capsule (500 mg total) by mouth 2 (two) times daily for 7 days. Rondel Baton, MD Taking Active   clopidogrel (PLAVIX) 75 MG tablet [id number]  TAKE 1 TABLET BY MOUTH EVERY DAY Jonelle Sidle, MD  Active   fluticasone [facility]) 50 MCG/ACT nasal spray [id number]  Place 2 sprays into both nostrils daily.   Patient taking differently: Place 2 sprays into both nostrils daily as needed.   Leath-Warren, Sadie Haber, NP  Active   gabapentin (NEURONTIN) 300 MG capsule [id number]  Take 1 capsule (300 mg total) by mouth 3 (three) times daily for 15 days. Gerhard Munch, MD  Active   hydrALAZINE (APRESOLINE) 25 MG tablet [id number]  Take 1 tablet (25 mg total) by mouth in the morning and at bedtime. Jonelle Sidle, MD  Active   losartan (COZAAR) 100 MG tablet [id number]  Take 100 mg by mouth daily. [provider]  Active   Menthol, Topical Analgesic, (BIOFREEZE ROLL-ON) 4 % GEL [id number]  Apply 1 application. topically daily as needed (pain). [provider]  Active Child, Pharmacy Records  metoprolol succinate (TOPROL XL) 25 MG 24 hr tablet [id number]  Take 0.5 tablets (12.5 mg total) by mouth daily. Sharlene Dory, NP  Active   nitroGLYCERIN (NITROSTAT) 0.4 MG SL tablet [id number]  PLACE 1 TABLET UNDER THE TONGUE EVERY 5 MINUTES AS NEEDED. Jonelle Sidle, MD  Active   pantoprazole (PROTONIX) 40 MG tablet [id number]  TAKE 1 TABLET BY MOUTH EVERY DAY Jonelle Sidle, MD  Active   rosuvastatin (CRESTOR) 40 MG tablet [id number]  TAKE 1 TABLET BY MOUTH EVERY DAY Jonelle Sidle, MD  Active             Home Care and Equipment/Supplies: Were Home Health Services Ordered?: No Any new equipment or medical supplies ordered?: No  Functional Questionnaire: Do you need assistance with bathing/showering or dressing?: Yes Do you need assistance with meal preparation?: No Do you need assistance with eating?: No Do you have difficulty maintaining continence: No Do you need  assistance with getting out of bed/getting out of a chair/moving?: No Do you have difficulty managing or taking your medications?: No  Follow up appointments reviewed: PCP Follow-up appointment confirmed?: No MD Provider Line [phone] Given: No (Patient states her daughter will make an appt with Dr.  Durwin Nora since she brings her to appointments.  Encouraged patient to see if her daughter will call for appointment soon as her next appointment with Dr. Durwin Nora is in August.) Specialist [facility] Follow-up appointment confirmed?: NA Do you need transportation to your follow-up appointment?: No Do you understand care options if your condition(s) worsen?: Yes-patient verbalized understanding  SDOH Interventions Today    Flowsheet Row Most Recent Value  SDOH Interventions   Food Insecurity Interventions Intervention Not Indicated  Housing Interventions Intervention Not Indicated  Transportation Interventions Intervention Not Indicated  Utilities Interventions Intervention Not Indicated  Financial Strain Interventions Intervention Not Indicated     Jodelle Gross, RN, BSN, CCM Care Management Coordinator [facility]

## 2023-03-08 NOTE — Telephone Encounter (Signed)
No reply to present.  Does already have appointment scheduled for 03/28/2023 with Sharlene Dory, NP.

## 2023-03-12 NOTE — Progress Notes (Signed)
Carelink Summary Report / Loop Recorder 

## 2023-03-13 ENCOUNTER — Encounter: Payer: Self-pay | Admitting: Cardiology

## 2023-03-22 ENCOUNTER — Other Ambulatory Visit: Payer: Self-pay | Admitting: Cardiology

## 2023-03-23 ENCOUNTER — Other Ambulatory Visit: Payer: Self-pay

## 2023-03-23 ENCOUNTER — Inpatient Hospital Stay (HOSPITAL_COMMUNITY)
Admission: EM | Admit: 2023-03-23 | Discharge: 2023-03-25 | DRG: 552 | Disposition: A | Discharge status: Home / Self Care | Payer: Medicare HMO | Attending: Internal Medicine | Admitting: Internal Medicine

## 2023-03-23 ENCOUNTER — Encounter (HOSPITAL_COMMUNITY): Payer: Self-pay

## 2023-03-23 ENCOUNTER — Emergency Department (HOSPITAL_COMMUNITY): Payer: Medicare HMO

## 2023-03-23 DIAGNOSIS — Z6830 Body mass index (BMI) 30.0-30.9, adult: Secondary | ICD-10-CM

## 2023-03-23 DIAGNOSIS — E46 Unspecified protein-calorie malnutrition: Secondary | ICD-10-CM | POA: Diagnosis not present

## 2023-03-23 DIAGNOSIS — Z7902 Long term (current) use of antithrombotics/antiplatelets: Secondary | ICD-10-CM

## 2023-03-23 DIAGNOSIS — I252 Old myocardial infarction: Secondary | ICD-10-CM

## 2023-03-23 DIAGNOSIS — Z79899 Other long term (current) drug therapy: Secondary | ICD-10-CM

## 2023-03-23 DIAGNOSIS — I471 Supraventricular tachycardia, unspecified: Secondary | ICD-10-CM | POA: Diagnosis not present

## 2023-03-23 DIAGNOSIS — E669 Obesity, unspecified: Secondary | ICD-10-CM | POA: Diagnosis not present

## 2023-03-23 DIAGNOSIS — I2510 Atherosclerotic heart disease of native coronary artery without angina pectoris: Secondary | ICD-10-CM | POA: Diagnosis present

## 2023-03-23 DIAGNOSIS — N1832 Chronic kidney disease, stage 3b: Secondary | ICD-10-CM | POA: Diagnosis present

## 2023-03-23 DIAGNOSIS — Z955 Presence of coronary angioplasty implant and graft: Secondary | ICD-10-CM | POA: Diagnosis not present

## 2023-03-23 DIAGNOSIS — Z8673 Personal history of transient ischemic attack (TIA), and cerebral infarction without residual deficits: Secondary | ICD-10-CM

## 2023-03-23 DIAGNOSIS — I442 Atrioventricular block, complete: Secondary | ICD-10-CM | POA: Diagnosis not present

## 2023-03-23 DIAGNOSIS — M47812 Spondylosis without myelopathy or radiculopathy, cervical region: Secondary | ICD-10-CM | POA: Diagnosis not present

## 2023-03-23 DIAGNOSIS — Z8249 Family history of ischemic heart disease and other diseases of the circulatory system: Secondary | ICD-10-CM

## 2023-03-23 DIAGNOSIS — I129 Hypertensive chronic kidney disease with stage 1 through stage 4 chronic kidney disease, or unspecified chronic kidney disease: Secondary | ICD-10-CM | POA: Diagnosis not present

## 2023-03-23 DIAGNOSIS — M1711 Unilateral primary osteoarthritis, right knee: Secondary | ICD-10-CM | POA: Diagnosis present

## 2023-03-23 DIAGNOSIS — K2100 Gastro-esophageal reflux disease with esophagitis, without bleeding: Secondary | ICD-10-CM | POA: Diagnosis present

## 2023-03-23 DIAGNOSIS — M4802 Spinal stenosis, cervical region: Secondary | ICD-10-CM | POA: Diagnosis present

## 2023-03-23 DIAGNOSIS — G3184 Mild cognitive impairment, so stated: Secondary | ICD-10-CM | POA: Diagnosis not present

## 2023-03-23 DIAGNOSIS — R29818 Other symptoms and signs involving the nervous system: Principal | ICD-10-CM

## 2023-03-23 DIAGNOSIS — I639 Cerebral infarction, unspecified: Secondary | ICD-10-CM | POA: Diagnosis not present

## 2023-03-23 DIAGNOSIS — R609 Edema, unspecified: Secondary | ICD-10-CM | POA: Diagnosis present

## 2023-03-23 DIAGNOSIS — E782 Mixed hyperlipidemia: Secondary | ICD-10-CM | POA: Diagnosis not present

## 2023-03-23 DIAGNOSIS — I10 Essential (primary) hypertension: Secondary | ICD-10-CM | POA: Diagnosis not present

## 2023-03-23 DIAGNOSIS — G4733 Obstructive sleep apnea (adult) (pediatric): Secondary | ICD-10-CM | POA: Diagnosis present

## 2023-03-23 DIAGNOSIS — M19011 Primary osteoarthritis, right shoulder: Secondary | ICD-10-CM | POA: Diagnosis present

## 2023-03-23 DIAGNOSIS — Z950 Presence of cardiac pacemaker: Secondary | ICD-10-CM

## 2023-03-23 DIAGNOSIS — R202 Paresthesia of skin: Secondary | ICD-10-CM

## 2023-03-23 DIAGNOSIS — G319 Degenerative disease of nervous system, unspecified: Secondary | ICD-10-CM | POA: Diagnosis not present

## 2023-03-23 DIAGNOSIS — I4510 Unspecified right bundle-branch block: Secondary | ICD-10-CM | POA: Diagnosis present

## 2023-03-23 DIAGNOSIS — R002 Palpitations: Secondary | ICD-10-CM | POA: Diagnosis present

## 2023-03-23 DIAGNOSIS — R200 Anesthesia of skin: Secondary | ICD-10-CM

## 2023-03-23 DIAGNOSIS — E8809 Other disorders of plasma-protein metabolism, not elsewhere classified: Secondary | ICD-10-CM | POA: Diagnosis not present

## 2023-03-23 DIAGNOSIS — Z91041 Radiographic dye allergy status: Secondary | ICD-10-CM

## 2023-03-23 DIAGNOSIS — Z885 Allergy status to narcotic agent status: Secondary | ICD-10-CM

## 2023-03-23 DIAGNOSIS — K219 Gastro-esophageal reflux disease without esophagitis: Secondary | ICD-10-CM | POA: Diagnosis present

## 2023-03-23 DIAGNOSIS — Z888 Allergy status to other drugs, medicaments and biological substances status: Secondary | ICD-10-CM

## 2023-03-23 DIAGNOSIS — M48061 Spinal stenosis, lumbar region without neurogenic claudication: Secondary | ICD-10-CM | POA: Diagnosis present

## 2023-03-23 DIAGNOSIS — R9089 Other abnormal findings on diagnostic imaging of central nervous system: Secondary | ICD-10-CM | POA: Diagnosis not present

## 2023-03-23 LAB — RAPID URINE DRUG SCREEN, HOSP PERFORMED
Amphetamines: NOT DETECTED
Barbiturates: NOT DETECTED
Benzodiazepines: NOT DETECTED
Cocaine: NOT DETECTED
Opiates: NOT DETECTED
Tetrahydrocannabinol: NOT DETECTED

## 2023-03-23 LAB — COMPREHENSIVE METABOLIC PANEL
ALT: 12 U/L (ref 0–44)
AST: 23 U/L (ref 15–41)
Albumin: 2.9 g/dL — ABNORMAL LOW (ref 3.5–5.0)
Alkaline Phosphatase: 82 U/L (ref 38–126)
Anion gap: 7 (ref 5–15)
BUN: 17 mg/dL (ref 8–23)
CO2: 23 mmol/L (ref 22–32)
Calcium: 9 mg/dL (ref 8.9–10.3)
Chloride: 109 mmol/L (ref 98–111)
Creatinine, Ser: 1.4 mg/dL — ABNORMAL HIGH (ref 0.44–1.00)
GFR, Estimated: 37 mL/min — ABNORMAL LOW (ref >60)
Glucose, Bld: 127 mg/dL — ABNORMAL HIGH (ref 70–99)
Potassium: 4.2 mmol/L (ref 3.5–5.1)
Sodium: 139 mmol/L (ref 135–145)
Total Bilirubin: 0.7 mg/dL (ref 0.3–1.2)
Total Protein: 6.7 g/dL (ref 6.5–8.1)

## 2023-03-23 LAB — DIFFERENTIAL
Abs Immature Granulocytes: 0.01 10*3/uL (ref 0.00–0.07)
Basophils Absolute: 0.1 10*3/uL (ref 0.0–0.1)
Basophils Relative: 1 %
Eosinophils Absolute: 0.2 10*3/uL (ref 0.0–0.5)
Eosinophils Relative: 3 %
Immature Granulocytes: 0 %
Lymphocytes Relative: 29 %
Lymphs Abs: 1.5 10*3/uL (ref 0.7–4.0)
Monocytes Absolute: 0.6 10*3/uL (ref 0.1–1.0)
Monocytes Relative: 12 %
Neutro Abs: 2.9 10*3/uL (ref 1.7–7.7)
Neutrophils Relative %: 55 %

## 2023-03-23 LAB — ETHANOL: Alcohol, Ethyl (B): <10 mg/dL (ref <10)

## 2023-03-23 LAB — CBC WITH DIFFERENTIAL/PLATELET
Abs Immature Granulocytes: 0.01 10*3/uL (ref 0.00–0.07)
Basophils Absolute: 0.1 10*3/uL (ref 0.0–0.1)
Basophils Relative: 1 %
Eosinophils Absolute: 0.2 10*3/uL (ref 0.0–0.5)
Eosinophils Relative: 3 %
HCT: 36.3 % (ref 36.0–46.0)
Hemoglobin: 11.5 g/dL — ABNORMAL LOW (ref 12.0–15.0)
Immature Granulocytes: 0 %
Lymphocytes Relative: 24 %
Lymphs Abs: 1.2 10*3/uL (ref 0.7–4.0)
MCH: 28.5 pg (ref 26.0–34.0)
MCHC: 31.7 g/dL (ref 30.0–36.0)
MCV: 90.1 fL (ref 80.0–100.0)
Monocytes Absolute: 0.6 10*3/uL (ref 0.1–1.0)
Monocytes Relative: 11 %
Neutro Abs: 3.2 10*3/uL (ref 1.7–7.7)
Neutrophils Relative %: 61 %
Platelets: 194 10*3/uL (ref 150–400)
RBC: 4.03 MIL/uL (ref 3.87–5.11)
RDW: 14.8 % (ref 11.5–15.5)
WBC: 5.2 10*3/uL (ref 4.0–10.5)
nRBC: 0 % (ref 0.0–0.2)

## 2023-03-23 LAB — I-STAT CHEM 8, ED
BUN: 19 mg/dL (ref 8–23)
Calcium, Ion: 1.24 mmol/L (ref 1.15–1.40)
Chloride: 108 mmol/L (ref 98–111)
Creatinine, Ser: 1.2 mg/dL — ABNORMAL HIGH (ref 0.44–1.00)
Glucose, Bld: 98 mg/dL (ref 70–99)
HCT: 37 % (ref 36.0–46.0)
Hemoglobin: 12.6 g/dL (ref 12.0–15.0)
Potassium: 4.3 mmol/L (ref 3.5–5.1)
Sodium: 141 mmol/L (ref 135–145)
TCO2: 27 mmol/L (ref 22–32)

## 2023-03-23 LAB — PROTIME-INR
INR: 1.1 (ref 0.8–1.2)
Prothrombin Time: 14.5 seconds (ref 11.4–15.2)

## 2023-03-23 LAB — URINALYSIS, ROUTINE W REFLEX MICROSCOPIC
Bacteria, UA: NONE SEEN
Bilirubin Urine: NEGATIVE
Glucose, UA: NEGATIVE mg/dL
Hgb urine dipstick: NEGATIVE
Ketones, ur: NEGATIVE mg/dL
Leukocytes,Ua: NEGATIVE
Nitrite: NEGATIVE
Protein, ur: 30 mg/dL — AB
Specific Gravity, Urine: 1.017 (ref 1.005–1.030)
pH: 5 (ref 5.0–8.0)

## 2023-03-23 LAB — CBC
HCT: 35.9 % — ABNORMAL LOW (ref 36.0–46.0)
Hemoglobin: 11.6 g/dL — ABNORMAL LOW (ref 12.0–15.0)
MCH: 29 pg (ref 26.0–34.0)
MCHC: 32.3 g/dL (ref 30.0–36.0)
MCV: 89.8 fL (ref 80.0–100.0)
Platelets: 183 10*3/uL (ref 150–400)
RBC: 4 MIL/uL (ref 3.87–5.11)
RDW: 14.9 % (ref 11.5–15.5)
WBC: 5.2 10*3/uL (ref 4.0–10.5)
nRBC: 0 % (ref 0.0–0.2)

## 2023-03-23 LAB — APTT: aPTT: 27 seconds (ref 24–36)

## 2023-03-23 MED ORDER — ENSURE ENLIVE PO LIQD
237.0000 mL | Freq: Two times a day (BID) | ORAL | Status: DC
  Filled 2023-03-23 (×6): qty 237

## 2023-03-23 MED ORDER — ACETAMINOPHEN 650 MG RE SUPP: 650.0000 mg | Freq: Four times a day (QID) | RECTAL | Status: DC | PRN

## 2023-03-23 MED ORDER — ACETAMINOPHEN 325 MG PO TABS
650.0000 mg | ORAL_TABLET | Freq: Four times a day (QID) | ORAL | Status: DC | PRN
  Administered 2023-03-24 – 2023-03-25 (×2): 650 mg via ORAL
  Filled 2023-03-23 (×2): qty 2

## 2023-03-23 MED ORDER — ONDANSETRON HCL 4 MG PO TABS
4.0000 mg | ORAL_TABLET | Freq: Four times a day (QID) | ORAL | Status: DC | PRN
  Filled 2023-03-23: qty 1

## 2023-03-23 MED ORDER — ROSUVASTATIN CALCIUM 20 MG PO TABS
40.0000 mg | ORAL_TABLET | Freq: Every day | ORAL | Status: DC
  Administered 2023-03-24 – 2023-03-25 (×2): 40 mg via ORAL
  Filled 2023-03-23 (×2): qty 2

## 2023-03-23 MED ORDER — PANTOPRAZOLE SODIUM 40 MG PO TBEC
40.0000 mg | DELAYED_RELEASE_TABLET | Freq: Every day | ORAL | Status: DC
  Administered 2023-03-24 – 2023-03-25 (×2): 40 mg via ORAL
  Filled 2023-03-23 (×2): qty 1

## 2023-03-23 MED ORDER — ONDANSETRON HCL 4 MG/2ML IJ SOLN: 4.0000 mg | Freq: Four times a day (QID) | INTRAMUSCULAR | Status: DC | PRN

## 2023-03-23 NOTE — ED Triage Notes (Signed)
Patient reports  Numbness Bilateral hands and feet Started this morning after awakening Knee pain Left leg Hx of fluid removal from knee

## 2023-03-23 NOTE — ED Provider Notes (Signed)
EMERGENCY DEPARTMENT AT [facility] Provider Note   CSN: [id number] Arrival date & time: 03/23/23  1214     History {Add pertinent medical, surgical, social history, OB history to HPI:1} Chief Complaint  Patient presents with   Numbness    Bilateral hands/feet    [name] is a 89 y.o. female.  HPI     Home Medications Prior to Admission medications   Medication Sig Start Date End Date Taking? Authorizing Provider  acetaminophen (TYLENOL) 500 MG tablet Take 1,000 mg by mouth every 6 (six) hours as needed for mild pain or moderate pain.    [provider]  amLODipine (NORVASC) 5 MG tablet TAKE 1 TABLET BY MOUTH EVERY DAY 04/05/22   Jonelle Sidle, MD  clopidogrel (PLAVIX) 75 MG tablet TAKE 1 TABLET BY MOUTH EVERY DAY 12/13/22   Jonelle Sidle, MD  fluticasone [facility]) 50 MCG/ACT nasal spray Place 2 sprays into both nostrils daily. Patient taking differently: Place 2 sprays into both nostrils daily as needed. 09/05/22   Leath-Warren, Sadie Haber, NP  gabapentin (NEURONTIN) 300 MG capsule Take 1 capsule (300 mg total) by mouth 3 (three) times daily for 15 days. 03/02/23 03/17/23  Gerhard Munch, MD  hydrALAZINE (APRESOLINE) 25 MG tablet Take 1 tablet (25 mg total) by mouth in the morning and at bedtime. 11/21/22 11/16/23  Jonelle Sidle, MD  losartan (COZAAR) 100 MG tablet Take 100 mg by mouth daily. 12/25/22 12/25/23  [provider]  Menthol, Topical Analgesic, (BIOFREEZE ROLL-ON) 4 % GEL Apply 1 application. topically daily as needed (pain).    [provider]  metoprolol succinate (TOPROL XL) 25 MG 24 hr tablet Take 0.5 tablets (12.5 mg total) by mouth daily. 02/11/23   Sharlene Dory, NP  nitroGLYCERIN (NITROSTAT) 0.4 MG SL tablet PLACE 1 TABLET UNDER THE TONGUE EVERY 5 MINUTES AS NEEDED. 11/21/22   Jonelle Sidle, MD  pantoprazole (PROTONIX) 40 MG tablet TAKE 1 TABLET BY MOUTH EVERY DAY 03/22/23   Jonelle Sidle, MD   rosuvastatin (CRESTOR) 40 MG tablet TAKE 1 TABLET BY MOUTH EVERY DAY 12/14/22   Jonelle Sidle, MD      Allergies    Atorvastatin, Contrast media [iodinated contrast media], Darvon [propoxyphene], and Codeine    Review of Systems   Review of Systems  Physical Exam Updated Vital Signs BP 122/78 (BP Location: Left Arm)   Pulse 92   Temp 98 F (36.7 C) (Oral)   Resp (!) 25   Ht 1.651 m (5\' 5" )   Wt 82.6 kg   SpO2 97%   BMI 30.29 kg/m  Physical Exam  ED Results / Procedures / Treatments   Labs (all labs ordered are listed, but only abnormal results are displayed) Labs Reviewed  CBC WITH DIFFERENTIAL/PLATELET - Abnormal; Notable for the following components:      Result Value   Hemoglobin 11.5 (*)    All other components within normal limits  COMPREHENSIVE METABOLIC PANEL - Abnormal; Notable for the following components:   Glucose, Bld 127 (*)    Creatinine, Ser 1.40 (*)    Albumin 2.9 (*)    GFR, Estimated 37 (*)    All other components within normal limits    EKG None  Radiology CT Head Wo Contrast  Result Date: 03/23/2023 CLINICAL DATA:  Stroke follow-up. EXAM: CT HEAD WITHOUT CONTRAST TECHNIQUE: Contiguous axial images were obtained from the base of the skull through the vertex without intravenous contrast. RADIATION  DOSE REDUCTION: This exam was performed according to the departmental dose-optimization program which includes automated exposure control, adjustment of the mA and/or kV according to patient size and/or use of iterative reconstruction technique. COMPARISON:  February 19, 2022 FINDINGS: Brain: No evidence of acute hemorrhage, hydrocephalus, extra-axial collection or mass lesion/mass effect. Advanced deep white matter microangiopathy. Brain parenchymal volume loss. No large territory ischemic infarct seen. Vascular: No hyperdense vessel or unexpected calcification. Skull: Normal. Negative for fracture or focal lesion. Sinuses/Orbits: No acute finding. Other:  None. IMPRESSION: 1. No acute intracranial abnormality. 2. Advanced deep white matter microangiopathy and brain parenchymal volume loss. Electronically Signed   By: Ted Mcalpine M.D.   On: 03/23/2023 13:36    Procedures Procedures  {Document cardiac monitor, telemetry assessment procedure when appropriate:1}  Medications Ordered in ED Medications - No data to display  ED Course/ Medical Decision Making/ A&P   {   Click here for ABCD2, HEART and other calculatorsREFRESH Note before signing :1}                          Medical Decision Making Amount and/or Complexity of Data Reviewed Labs: ordered. Radiology: ordered.   ***  {Document critical care time when appropriate:1} {Document review of labs and clinical decision tools ie heart score, Chads2Vasc2 etc:1}  {Document your independent review of radiology images, and any outside records:1} {Document your discussion with family members, caretakers, and with consultants:1} {Document social determinants of health affecting pt's care:1} {Document your decision making why or why not admission, treatments were needed:1} Final Clinical Impression(s) / ED Diagnoses Final diagnoses:  None    Rx / DC Orders ED Discharge Orders     None

## 2023-03-23 NOTE — H&P (Signed)
History and Physical    Patient: [name]:[id number] DOB: 04/03/1934 DOA: 03/23/2023 DOS: the patient was seen and examined on 03/23/2023 PCP: [name], MD  Patient coming from: Home  Chief Complaint:  Chief Complaint  Patient presents with   Numbness    Bilateral hands/feet   HPI: [name] is a 89 y.o. female with medical history significant of hypertension, hyperlipidemia, CVA, GERD, OSA, osteoarthritis, CAD, CKD 3B and obesity who presents to the emergency department accompanied by daughter due to numbness of left hand and both feet which started this morning around 8:30 AM.  Due to patient having a stroke about 7 months ago, daughter decided the patient should go to the ED for further evaluation and management.  She denies chest pain, shortness of breath, facial droop, nausea, vomiting, abdominal pain.  ED Course:  In the emergency department, she was tachypneic with respiratory rate of 26/min, but other vital signs were within normal range.  Workup in the ED showed normocytic anemia, BMP was normal except for blood glucose of 127, creatinine 1.40 (creatinine is within baseline range) and albumin 2.9.  Alcohol level was less than 10, urine drug screen was normal. CT head without contrast showed no acute intracranial abnormality Patient has a loop recorder, there was a mixup regarding beta-blocker use due to suspected history of atrial fibrillation, cardiologist (Dr. Ladona Ridgel) at [facility] was consulted and recommended admitting patient to [facility] with plan to follow-up on patient on arrival to [facility] per EDP Teleneurologist (Dr. Amada Jupiter) was consulted and recommended admitting patient to [facility] with plan to follow-up on patient on arrival to [facility] per EDP. Hospitalist was asked admit patient for further evaluation and management.   Review of Systems: Review of systems as noted in the HPI. All other systems reviewed and are negative.   Past Medical History:  Diagnosis Date    Arthritis    CKD (chronic kidney disease), stage III (HCC)    Complete heart block (HCC) 11/16/2016   a. transient during NSTEMI, resolved with revascularization.   Coronary artery disease 11/2009   a. with prior stent placement to the ramus intermedius and RCA. b. NSTEMI 11/2016 s/p DES to DES to distal Cx with moderate residual dz.   CVA (cerebral vascular accident) [facility])    Essential hypertension    Mixed hyperlipidemia    Non-ST elevation (NSTEMI) myocardial infarction (HCC) 11/16/2016   Obesity    Osteoarthritis    Reflux esophagitis    Sleep apnea 09/27/2010   Untreated, REM 64.7/hr AHI 18.5/hr RDI 19.2/hr. Patuent refused CPAP therapy   Past Surgical History:  Procedure Laterality Date   CHOLECYSTECTOMY     CORONARY ANGIOPLASTY WITH STENT PLACEMENT  2007   A 3.0x75mm CYPHER stent post dilated to 3.29 mm the 100% occlusion was reduced to 0%   CORONARY STENT INTERVENTION N/A 11/16/2016   Procedure: Coronary Stent Intervention;  Surgeon: Tonny Bollman, MD;  Location: [facility] INVASIVE CV LAB;  Service: Cardiovascular;  Laterality: N/A;   LEFT HEART CATH AND CORONARY ANGIOGRAPHY N/A 11/16/2016   Procedure: Left Heart Cath and Coronary Angiography;  Surgeon: Tonny Bollman, MD;  Location: [facility] INVASIVE CV LAB;  Service: Cardiovascular;  Laterality: N/A;   LEFT HEART CATHETERIZATION WITH CORONARY ANGIOGRAM N/A 07/12/2014   Procedure: LEFT HEART CATHETERIZATION WITH CORONARY ANGIOGRAM;  Surgeon: Micheline Chapman, MD;  Location: [facility] CATH LAB;  Service: Cardiovascular;  Laterality: N/A;   TEMPORARY PACEMAKER N/A 11/16/2016   Procedure: Temporary Pacemaker;  Surgeon: Tonny Bollman,  MD;  Location: [facility] INVASIVE CV LAB;  Service: Cardiovascular;  Laterality: N/A;    Social History:  reports that she has never smoked. She has never used smokeless tobacco. She reports that she does not drink alcohol and does not use drugs.   Allergies  Allergen Reactions   Atorvastatin Nausea And Vomiting   Contrast  Media [Iodinated Contrast Media] Other (See Comments)    Nausea/Vomiting and Shortness of Breath   Darvon [Propoxyphene] Nausea And Vomiting   Codeine Nausea And Vomiting and Anxiety    Family History  Problem Relation Age of Onset   Hypertension Mother    Aneurysm Mother        died at age 52   Other Father        unsure of medical history   Hypertension Other      Prior to Admission medications   Medication Sig Start Date End Date Taking? Authorizing Provider  acetaminophen (TYLENOL) 500 MG tablet Take 1,300 mg by mouth every 6 (six) hours as needed for mild pain or moderate pain.   Yes [provider]  amLODipine (NORVASC) 5 MG tablet TAKE 1 TABLET BY MOUTH EVERY DAY 04/05/22  Yes Jonelle Sidle, MD  carvedilol (COREG) 6.25 MG tablet Take 6.25 mg by mouth 2 (two) times daily with a meal. 02/09/23  Yes [provider]  clopidogrel (PLAVIX) 75 MG tablet TAKE 1 TABLET BY MOUTH EVERY DAY 12/13/22  Yes Jonelle Sidle, MD  hydrALAZINE (APRESOLINE) 25 MG tablet Take 1 tablet (25 mg total) by mouth in the morning and at bedtime. 11/21/22 11/16/23 Yes Jonelle Sidle, MD  losartan (COZAAR) 100 MG tablet Take 100 mg by mouth daily. 12/25/22 12/25/23 Yes [provider]  Menthol, Topical Analgesic, (BIOFREEZE ROLL-ON) 4 % GEL Apply 1 application. topically daily as needed (pain).   Yes [provider]  nitroGLYCERIN (NITROSTAT) 0.4 MG SL tablet PLACE 1 TABLET UNDER THE TONGUE EVERY 5 MINUTES AS NEEDED. 11/21/22  Yes Jonelle Sidle, MD  pantoprazole (PROTONIX) 40 MG tablet TAKE 1 TABLET BY MOUTH EVERY DAY 03/22/23  Yes Jonelle Sidle, MD  rosuvastatin (CRESTOR) 40 MG tablet TAKE 1 TABLET BY MOUTH EVERY DAY 12/14/22  Yes Jonelle Sidle, MD  metoprolol succinate (TOPROL XL) 25 MG 24 hr tablet Take 0.5 tablets (12.5 mg total) by mouth daily. Patient not taking: Reported on 03/23/2023 02/11/23   Sharlene Dory, NP    Physical Exam: BP (!) 159/79   Pulse  71   Temp 98.9 F (37.2 C) (Oral)   Resp (!) 23   Ht 5\' 5"  (1.651 m)   Wt 82.6 kg   SpO2 98%   BMI 30.29 kg/m   General: 89 y.o. year-old female well developed well nourished in no acute distress.  Alert and oriented x3. HEENT: NCAT, EOMI Neck: Supple, trachea medial Cardiovascular: Regular rate and rhythm with no rubs or gallops.  No thyromegaly or JVD noted.  No lower extremity edema. 2/4 pulses in all 4 extremities. Respiratory: Clear to auscultation with no wheezes or rales. Good inspiratory effort. Abdomen: Soft, nontender nondistended with normal bowel sounds x4 quadrants. Muskuloskeletal: No cyanosis, clubbing or edema noted bilaterally Neuro: CN II-XII intact, strength 5/5 x 4, patient was able to perform heel-to-shin movement.   Skin: No ulcerative lesions noted or rashes Psychiatry: Judgement and insight appear normal. Mood is appropriate for condition and setting          Labs on Admission:  Basic Metabolic  Panel: Recent Labs  Lab 03/23/23 1312 03/23/23 1810  NA 139 141  K 4.2 4.3  CL 109 108  CO2 23  --   GLUCOSE 127* 98  BUN 17 19  CREATININE 1.40* 1.20*  CALCIUM 9.0  --    Liver Function Tests: Recent Labs  Lab 03/23/23 1312  AST 23  ALT 12  ALKPHOS 82  BILITOT 0.7  PROT 6.7  ALBUMIN 2.9*   No results for input(s): "LIPASE", "AMYLASE" in the last 168 hours. No results for input(s): "AMMONIA" in the last 168 hours. CBC: Recent Labs  Lab 03/23/23 1312 03/23/23 1756 03/23/23 1810  WBC 5.2 5.2  --   NEUTROABS 3.2 2.9  --   HGB 11.5* 11.6* 12.6  HCT 36.3 35.9* 37.0  MCV 90.1 89.8  --   PLT 194 183  --    Cardiac Enzymes: No results for input(s): "CKTOTAL", "CKMB", "CKMBINDEX", "TROPONINI" in the last 168 hours.  BNP (last 3 results) Recent Labs    03/02/23 1118  BNP 58.0    ProBNP (last 3 results) No results for input(s): "PROBNP" in the last 8760 hours.  CBG: No results for input(s): "GLUCAP" in the last 168  hours.  Radiological Exams on Admission: CT Head Wo Contrast  Result Date: 03/23/2023 CLINICAL DATA:  Stroke follow-up. EXAM: CT HEAD WITHOUT CONTRAST TECHNIQUE: Contiguous axial images were obtained from the base of the skull through the vertex without intravenous contrast. RADIATION DOSE REDUCTION: This exam was performed according to the departmental dose-optimization program which includes automated exposure control, adjustment of the mA and/or kV according to patient size and/or use of iterative reconstruction technique. COMPARISON:  February 19, 2022 FINDINGS: Brain: No evidence of acute hemorrhage, hydrocephalus, extra-axial collection or mass lesion/mass effect. Advanced deep white matter microangiopathy. Brain parenchymal volume loss. No large territory ischemic infarct seen. Vascular: No hyperdense vessel or unexpected calcification. Skull: Normal. Negative for fracture or focal lesion. Sinuses/Orbits: No acute finding. Other: None. IMPRESSION: 1. No acute intracranial abnormality. 2. Advanced deep white matter microangiopathy and brain parenchymal volume loss. Electronically Signed   By: Ted Mcalpine M.D.   On: 03/23/2023 13:36    EKG: I independently viewed the EKG done and my findings are as followed: Normal sinus rhythm at rate of 76 beats per minute with APCs, RBBB and LAFB  Assessment/Plan Present on Admission:  GERD without esophagitis  Mixed hyperlipidemia  Essential hypertension  Chronic kidney disease, stage 3b (HCC)  Obesity (BMI 30-39.9)  Principal Problem:   Numbness and tingling of both feet Active Problems:   Essential hypertension   Obesity (BMI 30-39.9)   Mixed hyperlipidemia   GERD without esophagitis   Chronic kidney disease, stage 3b (HCC)   Hypoalbuminemia due to protein-calorie malnutrition (HCC)  Bilateral feet and left and numbness, rule out acute ischemic stroke CT head without contrast showed no acute intracranial abnormality Patient will be  admitted to telemetry unit  Echocardiogram pending MRI of brain without contrast pending MR cervical spine without contrast pending Continue fall precautions and neuro checks Lipid panel and hemoglobin A1c will be checked Continue PT/SLP/OT eval and treat Bedside swallow eval by nursing prior to diet Teleneurology (Dr. Amada Jupiter) was consulted and recommended further stroke workup. Dr. Wilford Corner recommended reaching out to neurology team after completing the imaging studies. Hypoalbuminemia possibly secondary to moderate protein calorie malnutrition Albumin 2.9, protein supplement will be provided  History of Loop recorder placement  There was a mixup regarding beta-blocker use due to suspected history  of atrial fibrillation, Cardiologist (Dr. Ladona Ridgel) was consulted and recommended admitting patient to [facility] with plan to follow-up on patient on arrival to [facility].  GERD without esophagitis Continue Protonix  Mixed hyperlipidemia Continue statin  Essential hypertension  Antihypertensives PRN if Blood pressure is greater than 220/120 or there is a concern for End organ damage/contraindications for permissive HTN. If blood pressure is greater than 220/120 give labetalol PO or IV or Vasotec IV with a goal of 15% reduction in BP during the first 24 hours.  Chronic kidney disease stage IIIb Creatinine 1.40 (creatinine is within baseline range)  Renally adjust medications, avoid nephrotoxic agents/dehydration/hypotension  Class I obesity (BMI 30.29) Diet and lifestyle modification  DVT prophylaxis: SCDs   Advance Care Planning: Full code  Consults: Neurology, cardiology (by [facility] EDP)  Family Communication: Daughter at bedside (all questions answered to satisfaction)  Severity of Illness: The appropriate patient status for this patient is INPATIENT. Inpatient status is judged to be reasonable and necessary in order to provide the required intensity of service to ensure the patient's safety.  The patient's presenting symptoms, physical exam findings, and initial radiographic and laboratory data in the context of their chronic comorbidities is felt to place them at high risk for further clinical deterioration. Furthermore, it is not anticipated that the patient will be medically stable for discharge from the hospital within 2 midnights of admission.   * I certify that at the point of admission it is my clinical judgment that the patient will require inpatient hospital care spanning beyond 2 midnights from the point of admission due to high intensity of service, high risk for further deterioration and high frequency of surveillance required.*  Author: Frankey Shown, DO 03/23/2023 10:47 PM  For on call review [email or web address].

## 2023-03-24 ENCOUNTER — Inpatient Hospital Stay (HOSPITAL_COMMUNITY): Payer: Medicare HMO

## 2023-03-24 DIAGNOSIS — E669 Obesity, unspecified: Secondary | ICD-10-CM | POA: Diagnosis not present

## 2023-03-24 DIAGNOSIS — E782 Mixed hyperlipidemia: Secondary | ICD-10-CM | POA: Diagnosis not present

## 2023-03-24 DIAGNOSIS — R200 Anesthesia of skin: Secondary | ICD-10-CM | POA: Diagnosis not present

## 2023-03-24 DIAGNOSIS — N1832 Chronic kidney disease, stage 3b: Secondary | ICD-10-CM | POA: Diagnosis not present

## 2023-03-24 MED ORDER — ASPIRIN 81 MG PO CHEW
81.0000 mg | CHEWABLE_TABLET | Freq: Every day | ORAL | Status: DC
  Administered 2023-03-25: 81 mg via ORAL
  Filled 2023-03-24 (×2): qty 1

## 2023-03-24 MED ORDER — LORAZEPAM 1 MG PO TABS
1.0000 mg | ORAL_TABLET | Freq: Once | ORAL | Status: AC | PRN
  Administered 2023-03-24: 1 mg via ORAL
  Filled 2023-03-24: qty 1

## 2023-03-24 MED ORDER — CLOPIDOGREL BISULFATE 75 MG PO TABS
75.0000 mg | ORAL_TABLET | Freq: Every day | ORAL | Status: DC
  Administered 2023-03-24: 75 mg via ORAL
  Filled 2023-03-24: qty 1

## 2023-03-24 NOTE — Progress Notes (Signed)
SLP Cancellation Note  Patient Details Name: [name] MRN: [id number] DOB: 04/07/1934   Cancelled treatment:       Reason Eval/Treat Not Completed: Patient at procedure or test/unavailable (In MRI). Note that per chart, patient has passed the [facility] swallow screen however per RN, swallow evaluation with SLP is requested.   [name] MA, CCC-SLP  [name] Meryl 03/24/2023, 2:54 PM

## 2023-03-24 NOTE — Progress Notes (Addendum)
PROGRESS NOTE  [name] UJW:[id number] DOB: July 01, 1934 DOA: 03/23/2023 PCP: [name], MD  Brief History:  89 year old female with a history of hypertension, hyperlipidemia, nausea, CKD stage III, coronary artery disease, and stroke presenting with numbness in her bilateral hands and feet, but worse on the left.  She noted this started around 8:30 AM on 03/23/2023.  She denied any visual disturbance, dysarthria, or worsening weakness.  Because her symptoms persisted, the patient presented for further evaluation and treatment.  Patient denies fevers, chills, headache, chest pain, dyspnea, nausea, vomiting, diarrhea, abdominal pain, dysuria, hematuria, hematochezia, and melena. In the ED, the patient was afebrile hemodynamically stable with oxygen saturation 97% on room air.  WBC 5.2, hemoglobin 11.6, platelets 183,000.  Sodium 139, potassium 4.2, bicarbonate 23, serum creatinine 1.40.  LFTs were unremarkable.  CT of the brain was negative for any acute findings.  EKG shows sinus rhythm and RBBB block.  UA was negative for pyuria. Notably, the patient had an acute ischemic stroke with noted multifocal punctate ischemia in the left hemisphere within multiple vascular territories.  She was seen by neurology at that time who felt that there was embolic phenomenon ST mechanism.  DAPT was recommended for 3 weeks followed by Plavix monotherapy thereafter.  Loop recorder placement was recommended.  The patient ultimately had a loop recorder placed by Dr. Lewayne Bunting which was done on 01/14/2023.  Neurology was consulted, Dr. Amada Jupiter and recommended transfer to [facility] for further evaluation and treatment.  Cardiology, Dr. Ladona Ridgel was consulted and recommended transfer to [facility] as well for interrogation of her ILR.  Apparently there has been some confusion regarding the patient's beta-blocker.  The patient stated that she did not know whether to take Coreg or metoprolol succinate  but had which been previously prescribed.  Reviewed the medical record shows a note from Dr. Diona Browner on 11/21/2022 which stated to stay off carvedilol.  During that office visit, the patient's hydralazine was changed to 25 mg twice daily.  She was to continue on amlodipine and losartan. An office note from cardiology, Sharlene Dory NP, stated that metoprolol succinate 12.5 mg daily was initiated on 02/11/2023 secondary to palpitations and tachycardia.    Assessment/Plan: Sensory disturbance - Neurology Consult -PT/OT evaluation -Speech therapy eval -CT brain--neg -MRI brain-- -08/21/2022 echo--EF 70-70%, no WMA, grade 1 DD, normal RVEF, mild MR, no PFO -LDL-- 08/20/2022 HbA1C--6.0 -Antiplatelet--ASA+Plavix  CKD stage IIIb -Creatinine 1.2-1.4  Coronary disease -status post stent intervention to the ramus intermedius and RCA in 2011 followed by NSTEMI in 2018 status post DES to the distal circumflex  -No chest pain presently -Continue antiplatelet therapy as discussed above -Continue statin  Mixed hyperlipidemia -Continue Crestor  -Repeat lipid panel  Class I obesity -BMI 30.2 -Lifestyle modification  Essential hypertension -Holding beta-blocker, amlodipine, hydralazine to allow for permissive hypertension  GERD -Continue pantoprazole                   Family Communication:   daughter at bedside 7/14  Consultants:  cardiology/neurology  Code Status:  FULL  DVT Prophylaxis:  Heparin   Procedures: As Listed in Progress Note Above  Antibiotics: None       Subjective:  She feels that her numbness is a little bit better than yesterday.  She denies any chest pain, shortness of breath, fevers, chills, cough, hemoptysis, nausea, vomiting, direct abdominal pain. Objective: Vitals:   03/24/23 0330 03/24/23 0515 03/24/23  0600 03/24/23 0630  BP: 135/74 (!) 146/78 (!) 148/77   Pulse: 81 90 75   Resp: (!) 23 (!) 31 (!) 25   Temp:    98 F (36.7 C)   TempSrc:      SpO2: 97% 98% 96%   Weight:      Height:       No intake or output data in the 24 hours ending 03/24/23 0826 Weight change:  Exam:  General:  Pt is alert, follows commands appropriately, not in acute distress HEENT: No icterus, No thrush, No neck mass, [place]/AT Cardiovascular: RRR, S1/S2, no rubs, no gallops Respiratory: CTA bilaterally, no wheezing, no crackles, no rhonchi Abdomen: Soft/+BS, non tender, non distended, no guarding Extremities: No edema, No lymphangitis, No petechiae, No rashes, no synovitis Neuro:  CN II-XII intact, strength 4/5 in RUE, RLE, strength 4/5 LUE, LLE; sensation intact bilateral; no dysmetria; babinski equivocal    Data Reviewed: I have personally reviewed following labs and imaging studies Basic Metabolic Panel: Recent Labs  Lab 03/23/23 1312 03/23/23 1810  NA 139 141  K 4.2 4.3  CL 109 108  CO2 23  --   GLUCOSE 127* 98  BUN 17 19  CREATININE 1.40* 1.20*  CALCIUM 9.0  --    Liver Function Tests: Recent Labs  Lab 03/23/23 1312  AST 23  ALT 12  ALKPHOS 82  BILITOT 0.7  PROT 6.7  ALBUMIN 2.9*   No results for input(s): "LIPASE", "AMYLASE" in the last 168 hours. No results for input(s): "AMMONIA" in the last 168 hours. Coagulation Profile: Recent Labs  Lab 03/23/23 1756  INR 1.1   CBC: Recent Labs  Lab 03/23/23 1312 03/23/23 1756 03/23/23 1810  WBC 5.2 5.2  --   NEUTROABS 3.2 2.9  --   HGB 11.5* 11.6* 12.6  HCT 36.3 35.9* 37.0  MCV 90.1 89.8  --   PLT 194 183  --    Cardiac Enzymes: No results for input(s): "CKTOTAL", "CKMB", "CKMBINDEX", "TROPONINI" in the last 168 hours. BNP: Invalid input(s): "POCBNP" CBG: No results for input(s): "GLUCAP" in the last 168 hours. HbA1C: No results for input(s): "HGBA1C" in the last 72 hours. Urine analysis:    Component Value Date/Time   COLORURINE YELLOW 03/23/2023 1740   APPEARANCEUR HAZY (A) 03/23/2023 1740   LABSPEC 1.017 03/23/2023 1740   PHURINE 5.0  03/23/2023 1740   GLUCOSEU NEGATIVE 03/23/2023 1740   HGBUR NEGATIVE 03/23/2023 1740   BILIRUBINUR NEGATIVE 03/23/2023 1740   KETONESUR NEGATIVE 03/23/2023 1740   PROTEINUR 30 (A) 03/23/2023 1740   UROBILINOGEN 1.0 07/11/2014 0914   NITRITE NEGATIVE 03/23/2023 1740   LEUKOCYTESUR NEGATIVE 03/23/2023 1740   Sepsis Labs: @LABRCNTIP (procalcitonin:4,lacticidven:4) )No results found for this or any previous visit (from the past 240 hour(s)).   Scheduled Meds:  feeding supplement  237 mL Oral BID BM   pantoprazole  40 mg Oral Daily   rosuvastatin  40 mg Oral Daily   Continuous Infusions:  Procedures/Studies: CT Head Wo Contrast  Result Date: 03/23/2023 CLINICAL DATA:  Stroke follow-up. EXAM: CT HEAD WITHOUT CONTRAST TECHNIQUE: Contiguous axial images were obtained from the base of the skull through the vertex without intravenous contrast. RADIATION DOSE REDUCTION: This exam was performed according to the departmental dose-optimization program which includes automated exposure control, adjustment of the mA and/or kV according to patient size and/or use of iterative reconstruction technique. COMPARISON:  February 19, 2022 FINDINGS: Brain: No evidence of acute hemorrhage, hydrocephalus, extra-axial collection or mass lesion/mass effect.  Advanced deep white matter microangiopathy. Brain parenchymal volume loss. No large territory ischemic infarct seen. Vascular: No hyperdense vessel or unexpected calcification. Skull: Normal. Negative for fracture or focal lesion. Sinuses/Orbits: No acute finding. Other: None. IMPRESSION: 1. No acute intracranial abnormality. 2. Advanced deep white matter microangiopathy and brain parenchymal volume loss. Electronically Signed   By: Ted Mcalpine M.D.   On: 03/23/2023 13:36   DG Knee Complete 4 Views Right  Result Date: 03/02/2023 CLINICAL DATA:  Pain in right knee. EXAM: RIGHT KNEE - COMPLETE 4+ VIEW COMPARISON:  12/03/2022 FINDINGS: Small suprapatellar joint  effusion. No signs of acute fracture or dislocation. Severe tricompartment osteoarthritis is identified with marked joint space narrowing, marginal spur formation as well as subchondral sclerosis. Loose bodies identified within the posterior joint space. IMPRESSION: 1. Small suprapatellar joint effusion. 2. Severe tricompartment osteoarthritis. 3. Loose bodies within the posterior joint space. Electronically Signed   By: Signa Kell M.D.   On: 03/02/2023 12:15   DG Knee Complete 4 Views Left  Result Date: 03/02/2023 CLINICAL DATA:  Left knee pain. EXAM: LEFT KNEE - COMPLETE 4+ VIEW COMPARISON:  December 03, 2022. FINDINGS: No fracture or dislocation is noted. Small suprapatellar joint effusion is noted. Vascular calcifications are noted. Severe narrowing of medial joint space is noted. Moderate narrowing of patellofemoral and lateral joint spaces are noted. IMPRESSION: Moderate to severe tricompartmental degenerative joint disease. Small suprapatellar joint effusion. No fracture or dislocation. Electronically Signed   By: Lupita Raider M.D.   On: 03/02/2023 12:00   DG Chest 2 View  Result Date: 03/02/2023 CLINICAL DATA:  Bilateral lower extremity weakness. EXAM: CHEST - 2 VIEW COMPARISON:  June 05, 2020. FINDINGS: Stable cardiomediastinal silhouette. Moderate size hiatal hernia. Both lungs are clear. The visualized skeletal structures are unremarkable. IMPRESSION: No active cardiopulmonary disease.  Moderate size hiatal hernia. Electronically Signed   By: Lupita Raider M.D.   On: 03/02/2023 11:59    Catarina Hartshorn, DO  [facility]  If 7PM-7AM, please contact night-coverage [email or web address] Password [email or web address] 03/24/2023, 8:26 AM   LOS: 1 day

## 2023-03-24 NOTE — Hospital Course (Signed)
89 year old female with a history of hypertension, hyperlipidemia, nausea, CKD stage III, coronary artery disease, and stroke presenting with numbness in her bilateral hands and feet, but worse on the left.  She noted this started around 8:30 AM on 03/23/2023.  She denied any visual disturbance, dysarthria, or worsening weakness.  Because her symptoms persisted, the patient presented for further evaluation and treatment.  Patient denies fevers, chills, headache, chest pain, dyspnea, nausea, vomiting, diarrhea, abdominal pain, dysuria, hematuria, hematochezia, and melena. In the ED, the patient was afebrile hemodynamically stable with oxygen saturation 97% on room air.  WBC 5.2, hemoglobin 11.6, platelets 183,000.  Sodium 139, potassium 4.2, bicarbonate 23, serum creatinine 1.40.  LFTs were unremarkable.  CT of the brain was negative for any acute findings.  EKG shows sinus rhythm and RBBB block.  UA was negative for pyuria. Notably, the patient had an acute ischemic stroke with noted multifocal punctate ischemia in the left hemisphere within multiple vascular territories.  She was seen by neurology at that time who felt that there was embolic phenomenon ST mechanism.  DAPT was recommended for 3 weeks followed by Plavix monotherapy thereafter.  Loop recorder placement was recommended.  The patient ultimately had a loop recorder placed by Dr. Lewayne Bunting which was done on 01/14/2023.  Neurology was consulted, Dr. Amada Jupiter and recommended transfer to [facility] for further evaluation and treatment.  Cardiology, Dr. Ladona Ridgel was consulted and recommended transfer to [facility] as well for interrogation of her ILR.  Apparently there has been some confusion regarding the patient's beta-blocker.  The patient stated that she did not know whether to take Coreg or metoprolol succinate but had which been previously prescribed.  Reviewed the medical record shows a note from Dr. Diona Browner on 11/21/2022 which stated to stay  off carvedilol.  During that office visit, the patient's hydralazine was changed to 25 mg twice daily.  She was to continue on amlodipine and losartan. An office note from cardiology, Sharlene Dory NP, stated that metoprolol succinate 12.5 mg daily was initiated on 02/11/2023 secondary to palpitations and tachycardia.

## 2023-03-25 ENCOUNTER — Ambulatory Visit: Payer: Medicare HMO

## 2023-03-25 ENCOUNTER — Inpatient Hospital Stay (HOSPITAL_COMMUNITY): Payer: Medicare HMO

## 2023-03-25 DIAGNOSIS — I10 Essential (primary) hypertension: Secondary | ICD-10-CM

## 2023-03-25 DIAGNOSIS — R200 Anesthesia of skin: Secondary | ICD-10-CM | POA: Diagnosis not present

## 2023-03-25 DIAGNOSIS — M4802 Spinal stenosis, cervical region: Secondary | ICD-10-CM | POA: Diagnosis not present

## 2023-03-25 DIAGNOSIS — R202 Paresthesia of skin: Secondary | ICD-10-CM | POA: Diagnosis not present

## 2023-03-25 DIAGNOSIS — I442 Atrioventricular block, complete: Secondary | ICD-10-CM

## 2023-03-25 LAB — BASIC METABOLIC PANEL
Anion gap: 6 (ref 5–15)
BUN: 12 mg/dL (ref 8–23)
CO2: 22 mmol/L (ref 22–32)
Calcium: 9.3 mg/dL (ref 8.9–10.3)
Chloride: 108 mmol/L (ref 98–111)
Creatinine, Ser: 1.33 mg/dL — ABNORMAL HIGH (ref 0.44–1.00)
GFR, Estimated: 39 mL/min — ABNORMAL LOW (ref >60)
Glucose, Bld: 209 mg/dL — ABNORMAL HIGH (ref 70–99)
Potassium: 3.9 mmol/L (ref 3.5–5.1)
Sodium: 136 mmol/L (ref 135–145)

## 2023-03-25 LAB — VITAMIN B12: Vitamin B-12: 433 pg/mL (ref 180–914)

## 2023-03-25 LAB — FOLATE: Folate: 12.3 ng/mL (ref >5.9)

## 2023-03-25 LAB — ECHOCARDIOGRAM COMPLETE
AR max vel: 2.26 cm2
AV Area VTI: 2.38 cm2
AV Area mean vel: 2.2 cm2
AV Mean grad: 5 mmHg
AV Peak grad: 10 mmHg
AV Vena cont: 0.7 cm
Ao pk vel: 1.58 m/s
Area-P 1/2: 3.21 cm2
Height: 65 in
P 1/2 time: 647 msec
S' Lateral: 2.1 cm
Weight: 2912 oz

## 2023-03-25 LAB — MAGNESIUM: Magnesium: 1.5 mg/dL — ABNORMAL LOW (ref 1.7–2.4)

## 2023-03-25 MED ORDER — MAGNESIUM OXIDE -MG SUPPLEMENT 400 (240 MG) MG PO TABS: 400.0000 mg | ORAL_TABLET | Freq: Every day | ORAL | 0 refills | Status: DC

## 2023-03-25 MED ORDER — METOPROLOL SUCCINATE ER 25 MG PO TB24
12.5000 mg | ORAL_TABLET | Freq: Every day | ORAL | Status: DC
  Administered 2023-03-25: 12.5 mg via ORAL
  Filled 2023-03-25: qty 1

## 2023-03-25 MED ORDER — DEXAMETHASONE 2 MG PO TABS: ORAL_TABLET | ORAL | 0 refills | Status: DC

## 2023-03-25 MED ORDER — DEXAMETHASONE SODIUM PHOSPHATE 10 MG/ML IJ SOLN
8.0000 mg | Freq: Once | INTRAMUSCULAR | Status: AC
  Administered 2023-03-25: 8 mg via INTRAVENOUS
  Filled 2023-03-25: qty 0.8

## 2023-03-25 MED ORDER — DEXAMETHASONE SODIUM PHOSPHATE 10 MG/ML IJ SOLN
8.0000 mg | Freq: Four times a day (QID) | INTRAMUSCULAR | Status: DC
  Administered 2023-03-25: 8 mg via INTRAVENOUS
  Filled 2023-03-25 (×2): qty 0.8

## 2023-03-25 MED ORDER — ACETAMINOPHEN 500 MG PO TABS: 1000.0000 mg | ORAL_TABLET | Freq: Four times a day (QID) | ORAL | Status: DC | PRN

## 2023-03-25 MED ORDER — MAGNESIUM OXIDE -MG SUPPLEMENT 400 (240 MG) MG PO TABS: 800.0000 mg | ORAL_TABLET | Freq: Every day | ORAL | Status: DC

## 2023-03-25 MED ORDER — MAGNESIUM SULFATE 2 GM/50ML IV SOLN
2.0000 g | Freq: Once | INTRAVENOUS | Status: DC
  Filled 2023-03-25: qty 50

## 2023-03-25 NOTE — Evaluation (Addendum)
Occupational Therapy Evaluation/Discharge Patient Details Name: [name] MRN: [id number] DOB: 1934-02-17 Today's Date: 03/25/2023   History of Present Illness 89 y.o. female with medical history significant of hypertension, hyperlipidemia, CVA, GERD, OSA, osteoarthritis, CAD, CKD 3B and obesity who presents to the emergency department accompanied by daughter due to numbness of left hand and both feet.  MRI cervical spine is noted to show some central canal stenosis at C2-C3 and C3-4, without overt cord impingement while showing some T2 signal changes that may suggest cord edema or myelomalacia.   Clinical Impression   Patient evaluated by Occupational Therapy with no further acute OT needs identified. All education has been completed and the patient has no further questions. Pt presents with limited Right shoulder ROM and strength with pain during mobility. Rounded shoulder and forward head indicate some scapular weakness also. Pt states she has been to therapy for her right shoulder more than a year ago. Recommend follow up OT in outpatient setting to focus on right shoulder ROM/strength and scapular strengthening in order to allow her to complete BADL tasks with less difficulty. Provided pt with HEP handout for neck and scapular A/ROM. OT is signing off. Thank you for this referral.       Recommendations for follow up therapy are one component of a multi-disciplinary discharge planning process, led by the attending physician.  Recommendations may be updated based on patient status, additional functional criteria and insurance authorization.   Assistance Recommended at Discharge Intermittent Supervision/Assistance  Patient can return home with the following A little help with bathing/dressing/bathroom;A little help with walking and/or transfers;Help with stairs or ramp for entrance;Assist for transportation;Assistance with cooking/housework    Functional Status Assessment  Patient has not  had a recent decline in their functional status  Equipment Recommendations  None recommended by OT       Precautions / Restrictions Precautions Precautions: Fall Restrictions Weight Bearing Restrictions: No      Mobility Bed Mobility Overal bed mobility: Needs Assistance Bed Mobility: Supine to Sit     Supine to sit: Min assist, HOB elevated     General bed mobility comments: VC to utilized bed rail by reaching across with LUE. Increased time required to complete task. Assist also provided to swing BLE completing off bed.    Transfers Overall transfer level: Needs assistance   Transfers: Sit to/from Stand, Bed to chair/wheelchair/BSC Sit to Stand: Min guard, From elevated surface     Step pivot transfers: Min guard, From elevated surface     General transfer comment: VC provided for hand placement while utilizing RW when completing sit<>stand. Education provided on safe approach and use of RW when turning to sit in recliner. Pt typically leaves RW at 90 degrees from recliner and sit.      Balance Overall balance assessment: Mild deficits observed, not formally tested                    ADL either performed or assessed with clinical judgement   ADL Overall ADL's : At baseline;Needs assistance/impaired Eating/Feeding: Set up;Sitting   Grooming: Brushing hair;Applying deodorant;Oral care;Wash/dry face;Wash/dry hands;Minimal assistance;Sitting Grooming Details (indicate cue type and reason): Assist required d/t RUE ROM limitations Upper Body Bathing: Minimal assistance;Sitting Upper Body Bathing Details (indicate cue type and reason): Assist required d/t RUE ROM limitations Lower Body Bathing: Minimal assistance;Sit to/from stand   Upper Body Dressing : Minimal assistance;Sitting Upper Body Dressing Details (indicate cue type and reason): Assist required d/t RUE ROM limitations  Lower Body Dressing: Supervision/safety;Sit to/from stand Lower Body Dressing  Details (indicate cue type and reason): Able to don/doff non skid socks with increased time while sitting EOB Toilet Transfer: Min guard;BSC/3in1;Rolling walker (2 wheels);Ambulation Toilet Transfer Details (indicate cue type and reason): VC provided for hand placement with RW management and sequencing for safety. Toileting- Clothing Manipulation and Hygiene: Set up;Sit to/from stand          Vision Baseline Vision/History: 1 Wears glasses Ability to See in Adequate Light: 0 Adequate Patient Visual Report: No change from baseline Vision Assessment?: No apparent visual deficits            Pertinent Vitals/Pain Pain Assessment Pain Assessment: No/denies pain     Hand Dominance Right   Extremity/Trunk Assessment Upper Extremity Assessment Upper Extremity Assessment: RUE deficits/detail;LUE deficits/detail RUE Deficits / Details: P/ROM: shoulder flexion to ~90 degrees, abduction ~25% range, limited er, full IR. A/ROM: shoulder flexion: able to demonstrate ~10-20 degrees, full elbow, wrist, and hand ROM. MMT: shoulder flexion/abduction/er: 3-/5, IR: 3+/5, elbow flexion: 4+/5, elbow extension: 3+/5, decreased gross grasp. Reports receiving therapy services for her shoulder more than a year ago. RUE Sensation:  (reports decreased sensation initially started over a year ago although recently it has gotten worse.) LUE Deficits / Details: A/ROM WFL, Strength: 4/5 in all ranges and joints. weak gross grasp.   Lower Extremity Assessment Lower Extremity Assessment: Defer to PT evaluation   Cervical / Trunk Assessment Cervical / Trunk Assessment: Kyphotic (forward head, rounded shoulders, and posterior pelvic tilt)   Communication Communication Communication: No difficulties   Cognition Arousal/Alertness: Awake/alert Behavior During Therapy: WFL for tasks assessed/performed Overall Cognitive Status: Within Functional Limits for tasks assessed     General Comments: Provides a long  explanation for questions and did require re-direction to specific questions asked.     General Comments  Purewick in use upon therapy arrival. It was removed as pt is mobile and able to transfer to [facility] and reports that she is aware when she needs to go.            Home Living Family/patient expects to be discharged to:: Private residence Living Arrangements: Children Available Help at Discharge: Family;Available PRN/intermittently (daughter works 30 hours a week) Type of Home: House Home Access: Stairs to enter Secretary/administrator of Steps: 6 Entrance Stairs-Rails: Right;Left;Can reach both Home Layout: One level     Bathroom Shower/Tub: Tub/shower unit;Sponge bathes at baseline (unable to get into her tub at home as it is older. Daughter assists her with taking a bath at bed level.)   Bathroom Toilet: Standard Bathroom Accessibility: Yes How Accessible: Accessible via walker Home Equipment: Rollator (4 wheels);Cane - single point;Shower seat;Grab bars - Chartered loss adjuster (2 wheels);BSC/3in1          Prior Functioning/Environment Prior Level of Function : Needs assist       Physical Assist : ADLs (physical)   ADLs (physical): Bathing;IADLs Mobility Comments: uses RW or Rolator for mobility ADLs Comments: Daughter assists her with bathing for thoroughness.        OT Problem List: Decreased strength;Decreased range of motion;Impaired sensation;Decreased safety awareness;Impaired UE functional use;Pain      OT Treatment/Interventions:   Self care/home management, theract   OT Goals(Current goals can be found in the care plan section) Acute Rehab OT Goals Patient Stated Goal: to sit up in recliner  OT Frequency:  1X visit       AM-PAC OT "6 Clicks" Daily Activity  Outcome Measure Help from another person eating meals?: A Little Help from another person taking care of personal grooming?: A Little Help from another person toileting, which includes  using toliet, bedpan, or urinal?: A Little Help from another person bathing (including washing, rinsing, drying)?: A Little Help from another person to put on and taking off regular upper body clothing?: A Little Help from another person to put on and taking off regular lower body clothing?: A Little 6 Click Score: 18   End of Session Equipment Utilized During Treatment: Gait belt;Rolling walker (2 wheels) Nurse Communication: Mobility status;Other (comment) (removal of purewick)  Activity Tolerance: Patient tolerated treatment well Patient left: in chair;with call bell/phone within reach;with family/visitor present  OT Visit Diagnosis: Muscle weakness (generalized) (M62.81);Pain Pain - Right/Left: Right Pain - part of body: Shoulder                Time: 1610-9604 OT Time Calculation (min): 55 min Charges:  OT General Charges $OT Visit: 1 Visit OT Evaluation $OT Eval Moderate Complexity: 1 Mod OT Treatments $Self Care/Home Management : 23-37 mins $Therapeutic Activity: 8-22 mins  [name], OTR/L,CBIS  Supplemental OT - [facility] and [facility] Secure Chat Preferred    Demoni Parmar, Charisse March 03/25/2023, 10:44 AM

## 2023-03-25 NOTE — TOC Transition Note (Signed)
Transition of Care (TOC) - CM/SW Discharge Note Donn Pierini RN, BSN Transitions of Care Unit 4E- RN Case Manager See Treatment Team for direct phone #   Patient Details  Name: [name] MRN: [id number] Date of Birth: [birth year]  [facility]) CM/SW Contact:  Darrold Span, RN Phone Number: 03/25/2023, 3:25 PM   Clinical Narrative:    Pt stable for transition home today, Per MD referral has been made for outpt PT/OT to [facility] outpt rehab.   Family to transport home, no further TOC needs noted.    Final next level of care: OP Rehab Barriers to Discharge: Barriers Resolved   Patient Goals and CMS Choice   Choice offered to / list presented to : NA  Discharge Placement                 Home        Discharge Plan and Services Additional resources added to the After Visit Summary for     Discharge Planning Services: CM Consult Post Acute Care Choice: NA          DME Arranged: N/A DME Agency: NA       HH Arranged: NA HH Agency: NA        Social Determinants of Health (SDOH) Interventions SDOH Screenings   Food Insecurity: No Food Insecurity (03/07/2023)  Housing: Low Risk  (03/07/2023)  Transportation Needs: No Transportation Needs (03/07/2023)  Utilities: Not At Risk (03/07/2023)  Depression (PHQ2-9): Low Risk  (01/31/2023)  Financial Resource Strain: Low Risk  (03/07/2023)  Physical Activity: Unknown (01/27/2023)  Social Connections: Unknown (01/27/2023)  Stress: No Stress Concern Present (01/27/2023)  Tobacco Use: Low Risk  (03/23/2023)     Readmission Risk Interventions    03/25/2023    3:24 PM 08/21/2022   12:03 PM  Readmission Risk Prevention Plan  Post Dischage Appt Complete   Medication Screening Complete   Transportation Screening Complete Complete  Home Care Screening  Complete  Medication Review (RN CM)  Complete

## 2023-03-25 NOTE — Patient Instructions (Signed)
  1) AROM: Lateral Neck Flexion   Slowly tilt head toward one shoulder, then the other. Hold each position __2__ seconds. Repeat __10__ times per set. Do __1__ sets per session. Do __1-2__ sessions per day.  http://orth.exer.us/296   Copyright  VHI. All rights reserved.  2) AROM: Neck Extension   Bend head backward. Hold __2__ seconds. Repeat ___10_ times per set. Do _1___ sets per session. Do ___1-2_ sessions per day.  http://orth.exer.us/300   Copyright  VHI. All rights reserved.  3) AROM: Neck Flexion   Bend head forward. Hold __2__ seconds. Repeat _10___ times per set. Do __1__ sets per session. Do _1-2___ sessions per day.  http://orth.exer.us/298   Copyright  VHI. All rights reserved.  4) AROM: Neck Rotation   Turn head slowly to look over one shoulder, then the other. Hold each position __2__ seconds. Repeat _10___ times per set. Do _1___ sets per session. Do __1-2__ sessions per day.  http://orth.exer.us/294   Copyright  VHI. All rights reserved.   1) Seated Row   Sit up straight with elbows by your sides. Pull back with shoulders/elbows, keeping forearms straight, as if pulling back on the reins of a horse. Squeeze shoulder blades together. Repeat _10__times, __1-2__sets/day    2) Shoulder Elevation    Sit up straight with arms by your sides. Slowly bring your shoulders up towards your ears. Repeat_10__times, __1-2__ sets/day

## 2023-03-25 NOTE — Progress Notes (Signed)
TRH night cross cover note:   I followed-up on this patient this evening and evaluated her at bedside. Per my discussion with her, the patient reports some interval improvement in the numbness in her bilateral hands, and notes resolution in the previously reported numbness involving her bilateral feet.  She denies any associated current weakness in the bilateral upper extremities nor any current weakness in the bilateral lower extremities.  She also denies any associated saddle anesthesia nor any recent urinary retention or stool incontinence.   On exam she exhibits symmetrical strength in the bilateral upper and lower extremities, with at least 4 of 5 strength x all 4 extremities.   Her MRI cervical spine is noted to show some central canal stenosis at C2-C3 and C3-4, without overt cord impingement while showing some T2 signal changes that may suggest cord edema or myelomalacia.   The history she provides to me this evening and physical exam appear reassuring. But in setting of the above radiographic findings, I'll also order a dose of decadron this evening.       [name] Pigg, DO Hospitalist

## 2023-03-25 NOTE — Evaluation (Signed)
Clinical/Bedside Swallow Evaluation Patient Details  Name: [name] MRN: [id number] Date of Birth: March 01, 1934  Today's Date: 03/25/2023 Time: SLP Start Time (ACUTE ONLY): 1002 SLP Stop Time (ACUTE ONLY): 1029 SLP Time Calculation (min) (ACUTE ONLY): 27 min  Past Medical History:  Past Medical History:  Diagnosis Date   Arthritis    CKD (chronic kidney disease), stage III (HCC)    Complete heart block (HCC) 11/16/2016   a. transient during NSTEMI, resolved with revascularization.   Coronary artery disease 11/2009   a. with prior stent placement to the ramus intermedius and RCA. b. NSTEMI 11/2016 s/p DES to DES to distal Cx with moderate residual dz.   CVA (cerebral vascular accident) [facility])    Essential hypertension    Mixed hyperlipidemia    Non-ST elevation (NSTEMI) myocardial infarction (HCC) 11/16/2016   Obesity    Osteoarthritis    Reflux esophagitis    Sleep apnea 09/27/2010   Untreated, REM 64.7/hr AHI 18.5/hr RDI 19.2/hr. Patuent refused CPAP therapy   Past Surgical History:  Past Surgical History:  Procedure Laterality Date   CHOLECYSTECTOMY     CORONARY ANGIOPLASTY WITH STENT PLACEMENT  2007   A 3.0x14mm CYPHER stent post dilated to 3.29 mm the 100% occlusion was reduced to 0%   CORONARY STENT INTERVENTION N/A 11/16/2016   Procedure: Coronary Stent Intervention;  Surgeon: Tonny Bollman, MD;  Location: [facility] INVASIVE CV LAB;  Service: Cardiovascular;  Laterality: N/A;   LEFT HEART CATH AND CORONARY ANGIOGRAPHY N/A 11/16/2016   Procedure: Left Heart Cath and Coronary Angiography;  Surgeon: Tonny Bollman, MD;  Location: [facility] INVASIVE CV LAB;  Service: Cardiovascular;  Laterality: N/A;   LEFT HEART CATHETERIZATION WITH CORONARY ANGIOGRAM N/A 07/12/2014   Procedure: LEFT HEART CATHETERIZATION WITH CORONARY ANGIOGRAM;  Surgeon: Micheline Chapman, MD;  Location: [facility] CATH LAB;  Service: Cardiovascular;  Laterality: N/A;   TEMPORARY PACEMAKER N/A 11/16/2016   Procedure: Temporary  Pacemaker;  Surgeon: Tonny Bollman, MD;  Location: [facility] INVASIVE CV LAB;  Service: Cardiovascular;  Laterality: N/A;   HPI:  [name] is a 89 y.o. female with medical history significant of hypertension, hyperlipidemia, CVA, GERD, OSA, osteoarthritis, CAD, CKD 3B and obesity who presents to the emergency department accompanied by daughter due to numbness of left hand and both feet which started this morning around 8:30 AM.  Due to patient having a stroke about 7 months ago, daughter decided the patient should go to the ED for further evaluation and management; Previous CXR on 03/02/23 indicated large hiatal hernia, but otherwise negative.  MRI on 7/14 revealed No acute intracranial abnormality or significant interval change.  2. Stable atrophy and white matter disease. This likely reflects the  sequela of chronic microvascular ischemia; ST consulted to assess swallow function.    Assessment / Plan / Recommendation  Clinical Impression  Pt seen for clinical swallowing evaluation despite passing Yale swallow screen prior to evaluation.  Pt denies dysphagia and had completed breakfast tray prior to SLP arrival with no reported complications.  OME unremarkable.  Pt wore full set of dentures during po consumption.  Observation of thin liquids via straw, puree and soft solids (peaches) without oral or pharyngeal dysphagia symptoms noted with timely swallow, adequate oral preparation/clearance and no symptoms of pharyngeal retention.  No overt s/sx of aspiration present throughout trial.  No esophageal symptoms observed or reported.  Recommend continue current diet of regular/thin liquid consistencies with general swallowing precautions in place.  Medications provided whole with liquids.  No further ST recommended in acute setting.  Thank you for this consult.  ST will s/o at this time. SLP Visit Diagnosis: Dysphagia, unspecified (R13.10)    Aspiration Risk  No limitations    Diet Recommendation   Thin;Age  appropriate regular  Medication Administration: Whole meds with liquid    Other  Recommendations Oral Care Recommendations: Oral care BID    Recommendations for follow up therapy are one component of a multi-disciplinary discharge planning process, led by the attending physician.  Recommendations may be updated based on patient status, additional functional criteria and insurance authorization.  Follow up Recommendations No SLP follow up      Assistance Recommended at Discharge  TBD  Functional Status Assessment Patient has had a recent decline in their functional status and demonstrates the ability to make significant improvements in function in a reasonable and predictable amount of time.  Frequency and Duration  (evaluation only)          Prognosis Prognosis for improved oropharyngeal function: Good      Swallow Study   General HPI: [name] is a 89 y.o. female with medical history significant of hypertension, hyperlipidemia, CVA, GERD, OSA, osteoarthritis, CAD, CKD 3B and obesity who presents to the emergency department accompanied by daughter due to numbness of left hand and both feet which started this morning around 8:30 AM.  Due to patient having a stroke about 7 months ago, daughter decided the patient should go to the ED for further evaluation and management; MRI on 7/14 revealed No acute intracranial abnormality or significant interval change.  2. Stable atrophy and white matter disease. This likely reflects the  sequela of chronic microvascular ischemia; ST consulted to assess swallow function. Type of Study: Bedside Swallow Evaluation Previous Swallow Assessment: Passed Yale swallow screen Diet Prior to this Study: Regular;Thin liquids (Level 0) Temperature Spikes Noted: No Respiratory Status: Room air History of Recent Intubation: No Behavior/Cognition: Alert;Cooperative Oral Cavity Assessment: Within Functional Limits Oral Care Completed by SLP: No (Pt completed  breakfast tray when I arrived and oral care completed prior to meal) Oral Cavity - Dentition: Dentures, top;Dentures, bottom Vision: Functional for self-feeding Self-Feeding Abilities: Able to feed self Patient Positioning: Upright in chair Baseline Vocal Quality: Normal Volitional Cough: Strong Volitional Swallow: Able to elicit    Oral/Motor/Sensory Function Overall Oral Motor/Sensory Function: Within functional limits   Ice Chips Ice chips: Not tested   Thin Liquid Thin Liquid: Within functional limits Presentation: Self Fed;Straw    Nectar Thick Nectar Thick Liquid: Not tested   Honey Thick Honey Thick Liquid: Not tested   Puree Puree: Within functional limits Presentation: Self Fed   Solid     Solid: Within functional limits Presentation: Self Fed      Pat Ailah Barna,M.S., CCC-SLP 03/25/2023,10:53 AM

## 2023-03-25 NOTE — Consult Note (Signed)
Reason for Consult: Cervical stenosis Referring Physician: Medicine  [name] is an 89 y.o. female.  HPI: 89 year old female admitted with acute onset of upper extremity numbness with some lower extremity numbness as well.  No history of trauma or new injury.  Symptoms have improved with time.  Patient states that her hands feel largely normal now.  She denies any weakness.  She has some chronic lumbar stenosis and some intermittent lower extremity symptoms which are stable.  She is having no bowel or bladder dysfunction.  Workup demonstrates evidence of significant multilevel cervical spondylosis with multifactorial stenosis throughout her cervical spine from C2 down to C7.  No new areas of disc herniation or acute injury.  Past Medical History:  Diagnosis Date   Arthritis    CKD (chronic kidney disease), stage III (HCC)    Complete heart block (HCC) 11/16/2016   a. transient during NSTEMI, resolved with revascularization.   Coronary artery disease 11/2009   a. with prior stent placement to the ramus intermedius and RCA. b. NSTEMI 11/2016 s/p DES to DES to distal Cx with moderate residual dz.   CVA (cerebral vascular accident) [facility])    Essential hypertension    Mixed hyperlipidemia    Non-ST elevation (NSTEMI) myocardial infarction (HCC) 11/16/2016   Obesity    Osteoarthritis    Reflux esophagitis    Sleep apnea 09/27/2010   Untreated, REM 64.7/hr AHI 18.5/hr RDI 19.2/hr. Patuent refused CPAP therapy    Past Surgical History:  Procedure Laterality Date   CHOLECYSTECTOMY     CORONARY ANGIOPLASTY WITH STENT PLACEMENT  2007   A 3.0x41mm CYPHER stent post dilated to 3.29 mm the 100% occlusion was reduced to 0%   CORONARY STENT INTERVENTION N/A 11/16/2016   Procedure: Coronary Stent Intervention;  Surgeon: Tonny Bollman, MD;  Location: [facility] INVASIVE CV LAB;  Service: Cardiovascular;  Laterality: N/A;   LEFT HEART CATH AND CORONARY ANGIOGRAPHY N/A 11/16/2016   Procedure: Left Heart Cath  and Coronary Angiography;  Surgeon: Tonny Bollman, MD;  Location: [facility] INVASIVE CV LAB;  Service: Cardiovascular;  Laterality: N/A;   LEFT HEART CATHETERIZATION WITH CORONARY ANGIOGRAM N/A 07/12/2014   Procedure: LEFT HEART CATHETERIZATION WITH CORONARY ANGIOGRAM;  Surgeon: Micheline Chapman, MD;  Location: [facility] CATH LAB;  Service: Cardiovascular;  Laterality: N/A;   TEMPORARY PACEMAKER N/A 11/16/2016   Procedure: Temporary Pacemaker;  Surgeon: Tonny Bollman, MD;  Location: [facility] INVASIVE CV LAB;  Service: Cardiovascular;  Laterality: N/A;    Family History  Problem Relation Age of Onset   Hypertension Mother    Aneurysm Mother        died at age 17   Other Father        unsure of medical history   Hypertension Other     Social History:  reports that she has never smoked. She has never used smokeless tobacco. She reports that she does not drink alcohol and does not use drugs.  Allergies:  Allergies  Allergen Reactions   Atorvastatin Nausea And Vomiting   Contrast Media [Iodinated Contrast Media] Other (See Comments)    Nausea/Vomiting and Shortness of Breath   Darvon [Propoxyphene] Nausea And Vomiting   Codeine Nausea And Vomiting and Anxiety    Medications: I have reviewed the patient's current medications.  Results for orders placed or performed during the hospital encounter of 03/23/23 (from the past 48 hour(s))  CBC with Differential     Status: Abnormal   Collection Time: 03/23/23  1:12 PM  Result Value  Ref Range   WBC 5.2 4.0 - 10.5 K/uL   RBC 4.03 3.87 - 5.11 MIL/uL   Hemoglobin 11.5 (L) 12.0 - 15.0 g/dL   HCT 16.1 09.6 - 04.5 %   MCV 90.1 80.0 - 100.0 fL   MCH 28.5 26.0 - 34.0 pg   MCHC 31.7 30.0 - 36.0 g/dL   RDW 40.9 81.1 - 91.4 %   Platelets 194 150 - 400 K/uL   nRBC 0.0 0.0 - 0.2 %   Neutrophils Relative % 61 %   Neutro Abs 3.2 1.7 - 7.7 K/uL   Lymphocytes Relative 24 %   Lymphs Abs 1.2 0.7 - 4.0 K/uL   Monocytes Relative 11 %   Monocytes Absolute 0.6 0.1 - 1.0  K/uL   Eosinophils Relative 3 %   Eosinophils Absolute 0.2 0.0 - 0.5 K/uL   Basophils Relative 1 %   Basophils Absolute 0.1 0.0 - 0.1 K/uL   Immature Granulocytes 0 %   Abs Immature Granulocytes 0.01 0.00 - 0.07 K/uL    Comment: Performed at [facility], [address] [place], [place] [address]  Comprehensive metabolic panel     Status: Abnormal   Collection Time: 03/23/23  1:12 PM  Result Value Ref Range   Sodium 139 135 - 145 mmol/L   Potassium 4.2 3.5 - 5.1 mmol/L   Chloride 109 98 - 111 mmol/L   CO2 23 22 - 32 mmol/L   Glucose, Bld 127 (H) 70 - 99 mg/dL    Comment: Glucose reference range applies only to samples taken after fasting for at least 8 hours.   BUN 17 8 - 23 mg/dL   Creatinine, Ser 6.21 (H) 0.44 - 1.00 mg/dL   Calcium 9.0 8.9 - 30.8 mg/dL   Total Protein 6.7 6.5 - 8.1 g/dL   Albumin 2.9 (L) 3.5 - 5.0 g/dL   AST 23 15 - 41 U/L   ALT 12 0 - 44 U/L   Alkaline Phosphatase 82 38 - 126 U/L   Total Bilirubin 0.7 0.3 - 1.2 mg/dL   GFR, Estimated 37 (L) >60 mL/min    Comment: (NOTE) Calculated using the CKD-EPI Creatinine Equation (2021)    Anion gap 7 5 - 15    Comment: Performed at [facility], [address], [place], [place] [address]  Urine rapid drug screen (hosp performed)     Status: None   Collection Time: 03/23/23  5:40 PM  Result Value Ref Range   Opiates NONE DETECTED NONE DETECTED   Cocaine NONE DETECTED NONE DETECTED   Benzodiazepines NONE DETECTED NONE DETECTED   Amphetamines NONE DETECTED NONE DETECTED   Tetrahydrocannabinol NONE DETECTED NONE DETECTED   Barbiturates NONE DETECTED NONE DETECTED    Comment: (NOTE) DRUG SCREEN FOR MEDICAL PURPOSES ONLY.  IF CONFIRMATION IS NEEDED FOR ANY PURPOSE, NOTIFY LAB WITHIN 5 DAYS.  LOWEST DETECTABLE LIMITS FOR URINE DRUG SCREEN Drug Class                     Cutoff (ng/mL) Amphetamine and metabolites    1000 Barbiturate and metabolites    200 Benzodiazepine                 200 Opiates and metabolites         300 Cocaine and metabolites        300 THC  50 Performed at [facility], [address] [place], [place] [address]   Urinalysis, Routine w reflex microscopic -Urine, Clean Catch     Status: Abnormal   Collection Time: 03/23/23  5:40 PM  Result Value Ref Range   Color, Urine YELLOW YELLOW   APPearance HAZY (A) CLEAR   Specific Gravity, Urine 1.017 1.005 - 1.030   pH 5.0 5.0 - 8.0   Glucose, UA NEGATIVE NEGATIVE mg/dL   Hgb urine dipstick NEGATIVE NEGATIVE   Bilirubin Urine NEGATIVE NEGATIVE   Ketones, ur NEGATIVE NEGATIVE mg/dL   Protein, ur 30 (A) NEGATIVE mg/dL   Nitrite NEGATIVE NEGATIVE   Leukocytes,Ua NEGATIVE NEGATIVE   RBC / HPF 0-5 0 - 5 RBC/hpf   WBC, UA 0-5 0 - 5 WBC/hpf   Bacteria, UA NONE SEEN NONE SEEN   Squamous Epithelial / HPF 0-5 0 - 5 /HPF    Comment: Performed at [facility], [address], [place], [place] [address]  Ethanol     Status: None   Collection Time: 03/23/23  5:56 PM  Result Value Ref Range   Alcohol, Ethyl (B) <10 <10 mg/dL    Comment: (NOTE) Lowest detectable limit for serum alcohol is 10 mg/dL.  For medical purposes only. Performed at [facility], [address], [place], [place] [address]   Protime-INR     Status: None   Collection Time: 03/23/23  5:56 PM  Result Value Ref Range   Prothrombin Time 14.5 11.4 - 15.2 seconds   INR 1.1 0.8 - 1.2    Comment: (NOTE) INR goal varies based on device and disease states. Performed at [facility], [address] [place], [place] [address]   APTT     Status: None   Collection Time: 03/23/23  5:56 PM  Result Value Ref Range   aPTT 27 24 - 36 seconds    Comment: Performed at [facility], [address] [place], [place] [address]  CBC     Status: Abnormal   Collection Time: 03/23/23  5:56 PM  Result Value Ref Range   WBC 5.2 4.0 - 10.5 K/uL   RBC 4.00 3.87 - 5.11 MIL/uL   Hemoglobin 11.6 (L) 12.0 - 15.0 g/dL   HCT 32.4 (L) 40.1 - 02.7 %   MCV 89.8 80.0 -  100.0 fL   MCH 29.0 26.0 - 34.0 pg   MCHC 32.3 30.0 - 36.0 g/dL   RDW 25.3 66.4 - 40.3 %   Platelets 183 150 - 400 K/uL   nRBC 0.0 0.0 - 0.2 %    Comment: Performed at [facility], [address], [place], [place] [address]  Differential     Status: None   Collection Time: 03/23/23  5:56 PM  Result Value Ref Range   Neutrophils Relative % 55 %   Neutro Abs 2.9 1.7 - 7.7 K/uL   Lymphocytes Relative 29 %   Lymphs Abs 1.5 0.7 - 4.0 K/uL   Monocytes Relative 12 %   Monocytes Absolute 0.6 0.1 - 1.0 K/uL   Eosinophils Relative 3 %   Eosinophils Absolute 0.2 0.0 - 0.5 K/uL   Basophils Relative 1 %   Basophils Absolute 0.1 0.0 - 0.1 K/uL   Immature Granulocytes 0 %   Abs Immature Granulocytes 0.01 0.00 - 0.07 K/uL    Comment: Performed at [facility], [address], [place], [place] [address]  I-stat chem 8, ED     Status: Abnormal   Collection Time: 03/23/23  6:10 PM  Result Value Ref Range   Sodium 141 135 - 145 mmol/L   Potassium 4.3 3.5 - 5.1 mmol/L   Chloride 108 98 - 111 mmol/L   BUN 19 8 - 23 mg/dL   Creatinine, Ser 2.44 (H) 0.44 - 1.00 mg/dL   Glucose, Bld 98 70 - 99 mg/dL    Comment: Glucose reference range applies only to samples taken after fasting for at least 8 hours.   Calcium, Ion 1.24 1.15 - 1.40 mmol/L   TCO2 27 22 - 32 mmol/L   Hemoglobin 12.6 12.0 - 15.0 g/dL   HCT 01.0 27.2 - 53.6 %    MR Cervical Spine Wo Contrast  Result Date: 03/24/2023 CLINICAL DATA:  Myelopathy, acute, cervical spine. EXAM: MRI CERVICAL SPINE WITHOUT CONTRAST TECHNIQUE: Multiplanar, multisequence MR imaging of the cervical spine was performed. No intravenous contrast was administered. COMPARISON:  MRI of the cervical spine 11/26/2021. FINDINGS: Alignment: 3 mm anterolisthesis at C4-5 scratched at 3 mm anterolisthesis is present at C4-5. No other significant listhesis is present. Straightening of the normal cervical lordosis is again seen. Vertebrae: Chronic loss of vertebral body heights at  C6 and C7 is stable. A remote superior endplate fracture at T3 is new since the prior exam, but not acute. Cord: Progressive cord compression is present at C2-3. The STIR sequence is distorted by patient motion. Central T2 cord signal changes are present at C2-3 and C3-4. Posterior Fossa, vertebral arteries, paraspinal tissues: Craniocervical junction is normal. Flow is present in the vertebral arteries bilaterally. Visualized intracranial contents are normal. Disc levels: C1-2: Negative. C2-3: A high-grade central canal stenosis is present. Canal is narrowed to less than 4 mm. Moderate foraminal stenosis is present bilaterally. C3-4: High-grade stenosis is present. The canal and cord are narrowed to 5 mm. Severe left and moderate right foraminal stenosis is present. C4-5: A broad-based disc osteophyte complex is present. The canal is narrowed to 8 mm. Moderate foraminal stenosis is worse left than right. C5-6: A broad-based disc osteophyte complex is present. The central canal is narrowed to 7 mm. No abnormal cord signal is present. Moderate foraminal stenosis is worse on the left. C6-7: A broad-based disc osteophyte complex is present. The central canal is narrowed to 6 mm. Moderate foraminal stenosis is present bilaterally. C7-T1: No significant central canal stenosis is present. Moderate right and mild left foraminal narrowing is present. IMPRESSION: 1. Progressive multilevel spondylosis of the cervical spine as described. 2. Progressive high-grade central canal stenosis at C2-3 and C3-4 with central T2 signal changes suggesting cord edema or myelomalacia. 3. Moderate foraminal stenosis bilaterally at C2-3. 4. Severe left and moderate right foraminal stenosis at C3-4. 5. Moderate foraminal stenosis bilaterally at C4-5 and C5-6 is worse on the left. 6. Moderate foraminal stenosis bilaterally at C6-7. 7. Moderate right and mild left foraminal narrowing at C7-T1. Electronically Signed   By: Marin Roberts  M.D.   On: 03/24/2023 16:00   MR Brain Wo Contrast (neuro protocol)  Result Date: 03/24/2023 CLINICAL DATA:  Stroke, follow-up.  Numbness in the left hand and EXAM: MRI HEAD WITHOUT CONTRAST TECHNIQUE: Multiplanar, multiecho pulse sequences of the brain and surrounding structures were obtained without intravenous contrast. COMPARISON:  CT head without contrast 03/23/2023. MR head without contrast 08/20/2022. FINDINGS: Brain: The diffusion-weighted images demonstrate no acute or subacute infarction. Mild generalized atrophy is again noted. Moderate confluent periventricular and scattered subcortical T2 hyperintensities are present bilaterally. Ventricles are proportionate to the degree of atrophy. No significant extra-axial fluid collection  is present. The brainstem and cerebellum are within normal limits. The internal auditory canals are within normal limits. Vascular: Flow is present in the major intracranial arteries. Skull and upper cervical spine: The craniocervical junction is normal. Upper cervical spine is within normal limits. Marrow signal is unremarkable. Sinuses/Orbits: The paranasal sinuses and mastoid air cells are clear. The globes and orbits are within normal limits. IMPRESSION: 1. No acute intracranial abnormality or significant interval change. 2. Stable atrophy and white matter disease. This likely reflects the sequela of chronic microvascular ischemia. Electronically Signed   By: Marin Roberts M.D.   On: 03/24/2023 14:24   CT Head Wo Contrast  Result Date: 03/23/2023 CLINICAL DATA:  Stroke follow-up. EXAM: CT HEAD WITHOUT CONTRAST TECHNIQUE: Contiguous axial images were obtained from the base of the skull through the vertex without intravenous contrast. RADIATION DOSE REDUCTION: This exam was performed according to the departmental dose-optimization program which includes automated exposure control, adjustment of the mA and/or kV according to patient size and/or use of iterative  reconstruction technique. COMPARISON:  February 19, 2022 FINDINGS: Brain: No evidence of acute hemorrhage, hydrocephalus, extra-axial collection or mass lesion/mass effect. Advanced deep white matter microangiopathy. Brain parenchymal volume loss. No large territory ischemic infarct seen. Vascular: No hyperdense vessel or unexpected calcification. Skull: Normal. Negative for fracture or focal lesion. Sinuses/Orbits: No acute finding. Other: None. IMPRESSION: 1. No acute intracranial abnormality. 2. Advanced deep white matter microangiopathy and brain parenchymal volume loss. Electronically Signed   By: Ted Mcalpine M.D.   On: 03/23/2023 13:36   Blood pressure 116/61, pulse 81, temperature (!) 97.4 F (36.3 C), temperature source Oral, resp. rate 20, height 5\' 5"  (1.651 m), weight 82.6 kg, SpO2 100%. Patient is awake and alert.  She is oriented and appropriate.  Speech is fluent.  Judgment insight are intact.  Cranial nerve function normal bilateral.  Motor examination reveals good strength in both upper and lower extremities without evidence of obvious deficit.  She has some patchy distal sensory loss in both upper and lower extremities.  She is not hyperreflexic.  She has no long track signs.  Examination head ears eyes nose throat is unremarked.  Chest and abdomen are benign.  Extremities are free from injury deformity.  Assessment/Plan: Patient with significant chronic cervical spondylosis with stenosis.  Recently she had some increased symptoms but these are largely resolved.  I discussed situation with the patient.  She is very adamant that she does not wish any type of surgical intervention which I think given her advanced age and overall medical condition is quite reasonable.  I think that she should be mobilized with therapy.  If she is able she may be discharged home.  She can follow-up with me as an outpatient and we will discuss treatment options further in an outpatient setting.  Sherilyn Cooter A  Corrie Reder 03/25/2023, 11:52 AM

## 2023-03-25 NOTE — Discharge Summary (Signed)
Physician Discharge Summary   Patient: [name] MRN: [id number] DOB: Mar 15, 1934  Admit date:     03/23/2023  Discharge date: 03/25/23  Discharge Physician: Lynden Oxford  PCP: [name], MD  Recommendations at discharge: Follow-up with neurosurgery in 1 week. Follow-up with PCP in 1 week to discuss blood pressure medications. Follow-up with cardiology as scheduled.   Follow-up Information     [facility] Follow up.   Specialty: Rehabilitation Why: referral made for outpt PT/OT- they will contact you to schedule or you may contact them if you have not heard anything within 7 days post discharge Contact information: [address] [id number] [place] [address] [phone]        Julio Sicks, MD. Schedule an appointment as soon as possible for a visit in 1 week(s).   Specialty: Neurosurgery Contact information: [address] [place] [address] [phone]         [name], MD. Schedule an appointment as soon as possible for a visit in 1 week(s).   Specialty: Internal Medicine Contact information: [address] [place] [address] [phone]                Discharge Diagnoses: Principal Problem:   Numbness and tingling of both feet Active Problems:   Essential hypertension   Obesity (BMI 30-39.9)   Mixed hyperlipidemia   GERD without esophagitis   Chronic kidney disease, stage 3b (HCC)   Hypoalbuminemia due to protein-calorie malnutrition [facility])  Hospital Course: 89 year old female with a history of hypertension, hyperlipidemia, nausea, CKD stage III, coronary artery disease, and stroke presenting with numbness in her bilateral hands and feet, but worse on the left.  She noted this started around 8:30 AM on 03/23/2023.  She denied any visual disturbance, dysarthria, or worsening weakness.  Because her symptoms persisted, the patient  presented for further evaluation and treatment.  Patient denies fevers, chills, headache, chest pain, dyspnea, nausea, vomiting, diarrhea, abdominal pain, dysuria, hematuria, hematochezia, and melena. In the ED, the patient was afebrile hemodynamically stable with oxygen saturation 97% on room air.  WBC 5.2, hemoglobin 11.6, platelets 183,000.  Sodium 139, potassium 4.2, bicarbonate 23, serum creatinine 1.40.  LFTs were unremarkable.  CT of the brain was negative for any acute findings.  EKG shows sinus rhythm and RBBB block.  UA was negative for pyuria. Notably, the patient had an acute ischemic stroke with noted multifocal punctate ischemia in the left hemisphere within multiple vascular territories.  She was seen by neurology at that time who felt that there was embolic phenomenon ST mechanism.  DAPT was recommended for 3 weeks followed by Plavix monotherapy thereafter.  Loop recorder placement was recommended.  The patient ultimately had a loop recorder placed by Dr. Lewayne Bunting which was done on 01/14/2023.  Neurology was consulted, Dr. Amada Jupiter and recommended transfer to [facility] for further evaluation and treatment.  Cardiology, Dr. Ladona Ridgel was consulted and recommended transfer to [facility] as well for interrogation of her ILR.  Apparently there has been some confusion regarding the patient's beta-blocker.  The patient stated that she did not know whether to take Coreg or metoprolol succinate but had which been previously prescribed.  Reviewed the medical record shows a note from Dr. Diona Browner on 11/21/2022 which stated to stay off carvedilol.  During that office visit, the patient's hydralazine was changed to 25 mg twice daily.  She was to continue  on amlodipine and losartan. An office note from cardiology, Sharlene Dory NP, stated that metoprolol succinate 12.5 mg daily was initiated on 02/11/2023 secondary to palpitations and tachycardia.  Cervical spondylosis with stenosis Cord  edema. Neurosurgery was consulted. Presented with bilateral numbness which is improving. Symptoms resolved with Decadron as well. Able to immediately physical therapy. Due to her comorbidities risk of complication is high for surgery and therefore plan would be conservative management. Outpatient follow-up with neurology recommended. Will continue Plavix on discharge.  SVT/tachycardia/palpitation. Recommend to resume metoprolol. Discontinue carvedilol.  Concern for stroke. MRI brain negative for acute stroke. No further stroke workup necessary.  HLD. Continue Crestor.  Obesity Body mass index is 30.29 kg/m.  Placing the pt at higher risk of poor outcomes.  Right shoulder and right knee arthritis. Outpatient follow-up with orthopedics recommended.  Hypomagnesemia. Replaced.  Hypertension. Patient is on amlodipine, hydralazine, beta-blocker and losartan. Given her renal function as well as soft blood pressure currently holding all medications.  GERD. Continue PPI.  CKD 3B. Baseline serum creatinine around 1.3-1.4. Serum creatinine remaining stable. Monitor.  Consultants:  Neurosurgery  Procedures performed:  Echocardiogram   DISCHARGE MEDICATION: Allergies as of 03/25/2023       Reactions   Atorvastatin Nausea And Vomiting   Contrast Media [iodinated Contrast Media] Other (See Comments)   Nausea/Vomiting and Shortness of Breath   Darvon [propoxyphene] Nausea And Vomiting   Codeine Nausea And Vomiting, Anxiety        Medication List     STOP taking these medications    amLODipine 5 MG tablet Commonly known as: NORVASC   carvedilol 6.25 MG tablet Commonly known as: COREG   losartan 100 MG tablet Commonly known as: COZAAR       TAKE these medications    acetaminophen 500 MG tablet Commonly known as: TYLENOL Take 2 tablets (1,000 mg total) by mouth every 6 (six) hours as needed for mild pain or moderate pain. What changed: how much to  take   Biofreeze Roll-On 4 % Gel Generic drug: Menthol (Topical Analgesic) Apply 1 application. topically daily as needed (pain).   clopidogrel 75 MG tablet Commonly known as: PLAVIX TAKE 1 TABLET BY MOUTH EVERY DAY   dexamethasone 2 MG tablet Commonly known as: DECADRON Take 4 mg three times daily for 5 days,Take 4 mg twice daily for 5 days,Take 4 mg daily for 5 days,Take 2 mg daily for 5 days then stop   hydrALAZINE 25 MG tablet Commonly known as: APRESOLINE Take 1 tablet (25 mg total) by mouth in the morning and at bedtime.   magnesium oxide 400 (240 Mg) MG tablet Commonly known as: MAG-OX Take 1 tablet (400 mg total) by mouth daily.   metoprolol succinate 25 MG 24 hr tablet Commonly known as: Toprol XL Take 0.5 tablets (12.5 mg total) by mouth daily.   nitroGLYCERIN 0.4 MG SL tablet Commonly known as: NITROSTAT PLACE 1 TABLET UNDER THE TONGUE EVERY 5 MINUTES AS NEEDED.   pantoprazole 40 MG tablet Commonly known as: PROTONIX TAKE 1 TABLET BY MOUTH EVERY DAY   rosuvastatin 40 MG tablet Commonly known as: CRESTOR TAKE 1 TABLET BY MOUTH EVERY DAY       Disposition: Home Diet recommendation: Cardiac diet  Discharge Exam: Vitals:   03/24/23 2325 03/25/23 0535 03/25/23 0852 03/25/23 1400  BP: (!) 139/53 (!) 140/66 116/61 (!) 143/78  Pulse: 87 (!) 56 81 73  Resp: 17 19 20 20   Temp: 98.3 F (36.8 C) 98.2 F (  36.8 C) (!) 97.4 F (36.3 C) 97.9 F (36.6 C)  TempSrc: Oral Oral Oral Oral  SpO2: 97% 91% 100% 97%  Weight:      Height:       General: Appear in mild distress; no visible Abnormal Neck Mass Or lumps, Conjunctiva normal Cardiovascular: S1 and S2 Present, no Murmur, Respiratory: good respiratory effort, Bilateral Air entry present and CTA, no Crackles, no wheezes Abdomen: Bowel Sound present, Non tender  Extremities: no Pedal edema Neurology: alert and oriented to time, place, and person  Bilateral improving numbness. Right worse than left upper  extremity Filed Weights   03/23/23 1221  Weight: 82.6 kg   Condition at discharge: stable  The results of significant diagnostics from this hospitalization (including imaging, microbiology, ancillary and laboratory) are listed below for reference.   Imaging Studies: ECHOCARDIOGRAM COMPLETE  Result Date: 03/25/2023    ECHOCARDIOGRAM REPORT   Patient Name:   Vianney EVONY REZEK Date of Exam: 03/25/2023 Medical Rec #:  [id number]      Height:       65.0 in Accession #:    [id number]     Weight:       182.0 lb Date of Birth:  November 14, 1933      BSA:          1.900 m Patient Age:    88 years       BP:           140/66 mmHg Patient Gender: F              HR:           78 bpm. Exam Location:  Inpatient Procedure: 2D Echo, Cardiac Doppler and Color Doppler Indications:    Stroke I63.9  History:        Patient has prior history of Echocardiogram examinations, most                 recent 09/28/2018. CAD, CKD 3; Risk Factors:Hypertension,                 Dyslipidemia and Non-Smoker.  Sonographer:    Dondra Prader RVT RCS Referring Phys: [id number] OLADAPO ADEFESO IMPRESSIONS  1. Left ventricular ejection fraction, by estimation, is 65 to 70%. The left ventricle has normal function. The left ventricle has no regional wall motion abnormalities. There is mild concentric left ventricular hypertrophy. Left ventricular diastolic parameters are consistent with Grade I diastolic dysfunction (impaired relaxation).  2. Right ventricular systolic function is normal. The right ventricular size is normal.  3. Left atrial size was mildly dilated.  4. The mitral valve is normal in structure. Trivial mitral valve regurgitation. No evidence of mitral stenosis.  5. The aortic valve is tricuspid. There is mild calcification of the aortic valve. Aortic valve regurgitation is trivial. Aortic valve sclerosis/calcification is present, without any evidence of aortic stenosis.  6. The inferior vena cava is normal in size with greater than 50% respiratory  variability, suggesting right atrial pressure of 3 mmHg. FINDINGS  Left Ventricle: Left ventricular ejection fraction, by estimation, is 65 to 70%. The left ventricle has normal function. The left ventricle has no regional wall motion abnormalities. The left ventricular internal cavity size was normal in size. There is  mild concentric left ventricular hypertrophy. Left ventricular diastolic parameters are consistent with Grade I diastolic dysfunction (impaired relaxation). Right Ventricle: The right ventricular size is normal. No increase in right ventricular wall thickness. Right ventricular systolic function is normal. Left Atrium: Left  atrial size was mildly dilated. Right Atrium: Right atrial size was normal in size. Pericardium: There is no evidence of pericardial effusion. Mitral Valve: The mitral valve is normal in structure. Trivial mitral valve regurgitation. No evidence of mitral valve stenosis. Tricuspid Valve: The tricuspid valve is normal in structure. Tricuspid valve regurgitation is trivial. No evidence of tricuspid stenosis. Aortic Valve: The aortic valve is tricuspid. There is mild calcification of the aortic valve. Aortic valve regurgitation is trivial. Aortic regurgitation PHT measures 647 msec. Aortic valve sclerosis/calcification is present, without any evidence of aortic stenosis. Aortic valve mean gradient measures 5.0 mmHg. Aortic valve peak gradient measures 10.0 mmHg. Aortic valve area, by VTI measures 2.38 cm. Pulmonic Valve: The pulmonic valve was normal in structure. Pulmonic valve regurgitation is trivial. No evidence of pulmonic stenosis. Aorta: The aortic root is normal in size and structure. Venous: The inferior vena cava is normal in size with greater than 50% respiratory variability, suggesting right atrial pressure of 3 mmHg. IAS/Shunts: No atrial level shunt detected by color flow Doppler.  LEFT VENTRICLE PLAX 2D LVIDd:         3.50 cm   Diastology LVIDs:         2.10 cm   LV  e' medial:    5.77 cm/s LV PW:         1.10 cm   LV E/e' medial:  13.6 LV IVS:        1.20 cm   LV e' lateral:   6.09 cm/s LVOT diam:     1.90 cm   LV E/e' lateral: 12.9 LV SV:         77 LV SV Index:   40 LVOT Area:     2.84 cm  RIGHT VENTRICLE             IVC RV S prime:     13.50 cm/s  IVC diam: 1.50 cm TAPSE (M-mode): 2.7 cm LEFT ATRIUM             Index        RIGHT ATRIUM          Index LA diam:        3.10 cm 1.63 cm/m   RA Area:     8.54 cm LA Vol (A2C):   34.2 ml 18.00 ml/m  RA Volume:   15.10 ml 7.95 ml/m LA Vol (A4C):   44.2 ml 23.26 ml/m LA Biplane Vol: 38.8 ml 20.42 ml/m  AORTIC VALVE                     PULMONIC VALVE AV Area (Vmax):    2.26 cm      PV Vmax:       1.19 m/s AV Area (Vmean):   2.20 cm      PV Peak grad:  5.7 mmHg AV Area (VTI):     2.38 cm AV Vmax:           158.00 cm/s AV Vmean:          107.000 cm/s AV VTI:            0.323 m AV Peak Grad:      10.0 mmHg AV Mean Grad:      5.0 mmHg LVOT Vmax:         126.00 cm/s LVOT Vmean:        83.200 cm/s LVOT VTI:          0.271 m LVOT/AV VTI ratio:  0.84 AI PHT:            647 msec AR Vena Contracta: 0.70 cm  AORTA Ao Root diam: 3.00 cm Ao Asc diam:  3.40 cm MITRAL VALVE MV Area (PHT): 3.21 cm     SHUNTS MV Decel Time: 236 msec     Systemic VTI:  0.27 m MV E velocity: 78.50 cm/s   Systemic Diam: 1.90 cm MV A velocity: 131.00 cm/s MV E/A ratio:  0.60 Arvilla Meres MD Electronically signed by Arvilla Meres MD Signature Date/Time: 03/25/2023/12:51:15 PM    Final    MR Cervical Spine Wo Contrast  Result Date: 03/24/2023 CLINICAL DATA:  Myelopathy, acute, cervical spine. EXAM: MRI CERVICAL SPINE WITHOUT CONTRAST TECHNIQUE: Multiplanar, multisequence MR imaging of the cervical spine was performed. No intravenous contrast was administered. COMPARISON:  MRI of the cervical spine 11/26/2021. FINDINGS: Alignment: 3 mm anterolisthesis at C4-5 scratched at 3 mm anterolisthesis is present at C4-5. No other significant listhesis is present.  Straightening of the normal cervical lordosis is again seen. Vertebrae: Chronic loss of vertebral body heights at C6 and C7 is stable. A remote superior endplate fracture at T3 is new since the prior exam, but not acute. Cord: Progressive cord compression is present at C2-3. The STIR sequence is distorted by patient motion. Central T2 cord signal changes are present at C2-3 and C3-4. Posterior Fossa, vertebral arteries, paraspinal tissues: Craniocervical junction is normal. Flow is present in the vertebral arteries bilaterally. Visualized intracranial contents are normal. Disc levels: C1-2: Negative. C2-3: A high-grade central canal stenosis is present. Canal is narrowed to less than 4 mm. Moderate foraminal stenosis is present bilaterally. C3-4: High-grade stenosis is present. The canal and cord are narrowed to 5 mm. Severe left and moderate right foraminal stenosis is present. C4-5: A broad-based disc osteophyte complex is present. The canal is narrowed to 8 mm. Moderate foraminal stenosis is worse left than right. C5-6: A broad-based disc osteophyte complex is present. The central canal is narrowed to 7 mm. No abnormal cord signal is present. Moderate foraminal stenosis is worse on the left. C6-7: A broad-based disc osteophyte complex is present. The central canal is narrowed to 6 mm. Moderate foraminal stenosis is present bilaterally. C7-T1: No significant central canal stenosis is present. Moderate right and mild left foraminal narrowing is present. IMPRESSION: 1. Progressive multilevel spondylosis of the cervical spine as described. 2. Progressive high-grade central canal stenosis at C2-3 and C3-4 with central T2 signal changes suggesting cord edema or myelomalacia. 3. Moderate foraminal stenosis bilaterally at C2-3. 4. Severe left and moderate right foraminal stenosis at C3-4. 5. Moderate foraminal stenosis bilaterally at C4-5 and C5-6 is worse on the left. 6. Moderate foraminal stenosis bilaterally at C6-7.  7. Moderate right and mild left foraminal narrowing at C7-T1. Electronically Signed   By: Marin Roberts M.D.   On: 03/24/2023 16:00   MR Brain Wo Contrast (neuro protocol)  Result Date: 03/24/2023 CLINICAL DATA:  Stroke, follow-up.  Numbness in the left hand and EXAM: MRI HEAD WITHOUT CONTRAST TECHNIQUE: Multiplanar, multiecho pulse sequences of the brain and surrounding structures were obtained without intravenous contrast. COMPARISON:  CT head without contrast 03/23/2023. MR head without contrast 08/20/2022. FINDINGS: Brain: The diffusion-weighted images demonstrate no acute or subacute infarction. Mild generalized atrophy is again noted. Moderate confluent periventricular and scattered subcortical T2 hyperintensities are present bilaterally. Ventricles are proportionate to the degree of atrophy. No significant extra-axial fluid collection is present. The brainstem and cerebellum are within normal  limits. The internal auditory canals are within normal limits. Vascular: Flow is present in the major intracranial arteries. Skull and upper cervical spine: The craniocervical junction is normal. Upper cervical spine is within normal limits. Marrow signal is unremarkable. Sinuses/Orbits: The paranasal sinuses and mastoid air cells are clear. The globes and orbits are within normal limits. IMPRESSION: 1. No acute intracranial abnormality or significant interval change. 2. Stable atrophy and white matter disease. This likely reflects the sequela of chronic microvascular ischemia. Electronically Signed   By: Marin Roberts M.D.   On: 03/24/2023 14:24   CT Head Wo Contrast  Result Date: 03/23/2023 CLINICAL DATA:  Stroke follow-up. EXAM: CT HEAD WITHOUT CONTRAST TECHNIQUE: Contiguous axial images were obtained from the base of the skull through the vertex without intravenous contrast. RADIATION DOSE REDUCTION: This exam was performed according to the departmental dose-optimization program which includes  automated exposure control, adjustment of the mA and/or kV according to patient size and/or use of iterative reconstruction technique. COMPARISON:  February 19, 2022 FINDINGS: Brain: No evidence of acute hemorrhage, hydrocephalus, extra-axial collection or mass lesion/mass effect. Advanced deep white matter microangiopathy. Brain parenchymal volume loss. No large territory ischemic infarct seen. Vascular: No hyperdense vessel or unexpected calcification. Skull: Normal. Negative for fracture or focal lesion. Sinuses/Orbits: No acute finding. Other: None. IMPRESSION: 1. No acute intracranial abnormality. 2. Advanced deep white matter microangiopathy and brain parenchymal volume loss. Electronically Signed   By: Ted Mcalpine M.D.   On: 03/23/2023 13:36   DG Knee Complete 4 Views Right  Result Date: 03/02/2023 CLINICAL DATA:  Pain in right knee. EXAM: RIGHT KNEE - COMPLETE 4+ VIEW COMPARISON:  12/03/2022 FINDINGS: Small suprapatellar joint effusion. No signs of acute fracture or dislocation. Severe tricompartment osteoarthritis is identified with marked joint space narrowing, marginal spur formation as well as subchondral sclerosis. Loose bodies identified within the posterior joint space. IMPRESSION: 1. Small suprapatellar joint effusion. 2. Severe tricompartment osteoarthritis. 3. Loose bodies within the posterior joint space. Electronically Signed   By: Signa Kell M.D.   On: 03/02/2023 12:15   DG Knee Complete 4 Views Left  Result Date: 03/02/2023 CLINICAL DATA:  Left knee pain. EXAM: LEFT KNEE - COMPLETE 4+ VIEW COMPARISON:  December 03, 2022. FINDINGS: No fracture or dislocation is noted. Small suprapatellar joint effusion is noted. Vascular calcifications are noted. Severe narrowing of medial joint space is noted. Moderate narrowing of patellofemoral and lateral joint spaces are noted. IMPRESSION: Moderate to severe tricompartmental degenerative joint disease. Small suprapatellar joint effusion. No  fracture or dislocation. Electronically Signed   By: Lupita Raider M.D.   On: 03/02/2023 12:00   DG Chest 2 View  Result Date: 03/02/2023 CLINICAL DATA:  Bilateral lower extremity weakness. EXAM: CHEST - 2 VIEW COMPARISON:  June 05, 2020. FINDINGS: Stable cardiomediastinal silhouette. Moderate size hiatal hernia. Both lungs are clear. The visualized skeletal structures are unremarkable. IMPRESSION: No active cardiopulmonary disease.  Moderate size hiatal hernia. Electronically Signed   By: Lupita Raider M.D.   On: 03/02/2023 11:59    Microbiology: Results for orders placed or performed during the hospital encounter of 03/02/23  Urine Culture     Status: Abnormal   Collection Time: 03/02/23  3:30 PM   Specimen: Urine, Clean Catch  Result Value Ref Range Status   Specimen Description   Final    URINE, CLEAN CATCH Performed at [facility], [address] [place], [place] [address]    Special Requests   Final  NONE Performed at [facility], [address] [place], [place] [address]    Culture >=100,000 COLONIES/mL ESCHERICHIA COLI (A)  Final   Report Status 03/05/2023 FINAL  Final   Organism ID, Bacteria ESCHERICHIA COLI (A)  Final      Susceptibility   Escherichia coli - MIC*    AMPICILLIN 16 INTERMEDIATE Intermediate     CEFAZOLIN <=4 SENSITIVE Sensitive     CEFEPIME <=0.12 SENSITIVE Sensitive     CEFTRIAXONE <=0.25 SENSITIVE Sensitive     CIPROFLOXACIN >=4 RESISTANT Resistant     GENTAMICIN <=1 SENSITIVE Sensitive     IMIPENEM <=0.25 SENSITIVE Sensitive     NITROFURANTOIN <=16 SENSITIVE Sensitive     TRIMETH/SULFA <=20 SENSITIVE Sensitive     AMPICILLIN/SULBACTAM 8 SENSITIVE Sensitive     PIP/TAZO <=4 SENSITIVE Sensitive     * >=100,000 COLONIES/mL ESCHERICHIA COLI   Labs: CBC: Recent Labs  Lab 03/23/23 1312 03/23/23 1756 03/23/23 1810  WBC 5.2 5.2  --   NEUTROABS 3.2 2.9  --   HGB 11.5* 11.6* 12.6  HCT 36.3 35.9* 37.0  MCV 90.1 89.8  --   PLT 194 183  --     Basic Metabolic Panel: Recent Labs  Lab 03/23/23 1312 03/23/23 1810 03/25/23 1049  NA 139 141 136  K 4.2 4.3 3.9  CL 109 108 108  CO2 23  --  22  GLUCOSE 127* 98 209*  BUN 17 19 12   CREATININE 1.40* 1.20* 1.33*  CALCIUM 9.0  --  9.3  MG  --   --  1.5*   Liver Function Tests: Recent Labs  Lab 03/23/23 1312  AST 23  ALT 12  ALKPHOS 82  BILITOT 0.7  PROT 6.7  ALBUMIN 2.9*   CBG: No results for input(s): "GLUCAP" in the last 168 hours.  Discharge time spent: greater than 30 minutes.  Author: Lynden Oxford, MD  [facility]

## 2023-03-25 NOTE — Progress Notes (Signed)
*  PRELIMINARY RESULTS* Echocardiogram 2D Echocardiogram has been performed.  Laddie Aquas 03/25/2023, 12:19 PM

## 2023-03-25 NOTE — Evaluation (Signed)
Physical Therapy Evaluation Patient Details Name: [name] MRN: [id number] DOB: May 08, 1934 Today's Date: 03/25/2023  History of Present Illness  89 y.o. female with medical history significant of hypertension, hyperlipidemia, CVA, GERD, OSA, osteoarthritis, CAD, CKD 3B and obesity who presents to the emergency department accompanied by daughter due to numbness of left hand and both feet.  MRI cervical spine is noted to show some central canal stenosis at C2-C3 and C3-4, without overt cord impingement while showing some T2 signal changes that may suggest cord edema or myelomalacia.  Clinical Impression  Patient presents with decreased mobility due to generalized weakness and decreased balance.  Previously ambulatory with walker versus cane and having help with bathing daily.  Patient has good support from her daughter and should be able to return home with intermittent family support and with follow up outpatient PT.  Daughter reports able to transport.  Will follow if remains in acute setting.         Assistance Recommended at Discharge Intermittent Supervision/Assistance  If plan is discharge home, recommend the following:  Can travel by private vehicle  A lot of help with bathing/dressing/bathroom;Assistance with cooking/housework;Direct supervision/assist for medications management;Assist for transportation;Help with stairs or ramp for entrance        Equipment Recommendations None recommended by PT  Recommendations for Other Services       Functional Status Assessment Patient has had a recent decline in their functional status and demonstrates the ability to make significant improvements in function in a reasonable and predictable amount of time.     Precautions / Restrictions Precautions Precautions: Fall Restrictions Weight Bearing Restrictions: No      Mobility  Bed Mobility Overal bed mobility: Needs Assistance Bed Mobility: Sit to Supine       Sit to supine:  Min assist   General bed mobility comments: up in chair initially, assist for legs into bed to supine    Transfers Overall transfer level: Needs assistance Equipment used: Rolling walker (2 wheels) Transfers: Sit to/from Stand Sit to Stand: Supervision           General transfer comment: used hand on armrests with cues for sit to stand    Ambulation/Gait Ambulation/Gait assistance: Supervision, Min guard Gait Distance (Feet): 100 Feet Assistive device: Rolling walker (2 wheels) Gait Pattern/deviations: Step-through pattern, Decreased stride length, Trunk flexed       General Gait Details: cues (verbal and tactile) for upright posture, educating important for cervical spine issues to work on Water quality scientist     Tilt Bed    Modified Rankin (Stroke Patients Only)       Balance Overall balance assessment: Needs assistance   Sitting balance-Leahy Scale: Good     Standing balance support: Reliant on assistive device for balance Standing balance-Leahy Scale: Poor Standing balance comment: UE support for balance; reports fell about a year ago when turning to go into kitchen from bedroom, but got up on her own, then while in kitchen in chair fell over and called her daughter to help her up                             Pertinent Vitals/Pain Pain Assessment Pain Assessment: Faces Faces Pain Scale: Hurts little more Pain Location: R knee>L knee Pain Descriptors / Indicators: Aching, Discomfort Pain Intervention(s): Monitored during session, Repositioned    Home Living Family/patient  expects to be discharged to:: Private residence Living Arrangements: Children Available Help at Discharge: Family;Available PRN/intermittently (daughter works 3 minutes away 30 hours/week) Type of Home: House Home Access: Stairs to enter Entrance Stairs-Rails: Right;Left;Can reach both Secretary/administrator of Steps: 6   Home Layout:  One level Home Equipment: Rollator (4 wheels);Cane - single point;Shower seat;Grab bars - Chartered loss adjuster (2 wheels);BSC/3in1;Wheelchair - manual      Prior Function Prior Level of Function : Needs assist       Physical Assist : ADLs (physical)   ADLs (physical): Bathing;IADLs Mobility Comments: uses RW or Rolator for mobility ADLs Comments: Daughter assists her with bathing for thoroughness.     Hand Dominance   Dominant Hand: Right    Extremity/Trunk Assessment   Upper Extremity Assessment Upper Extremity Assessment: Defer to OT evaluation RUE Deficits / Details: P/ROM: shoulder flexion to ~90 degrees, abduction ~25% range, limited er, full IR. A/ROM: shoulder flexion: able to demonstrate ~10-20 degrees, full elbow, wrist, and hand ROM. MMT: shoulder flexion/abduction/er: 3-/5, IR: 3+/5, elbow flexion: 4+/5, elbow extension: 3+/5, decreased gross grasp. Reports receiving therapy services for her shoulder more than a year ago. RUE Sensation:  (reports decreased sensation initially started over a year ago although recently it has gotten worse.) LUE Deficits / Details: A/ROM WFL, Strength: 4/5 in all ranges and joints. weak gross grasp.    Lower Extremity Assessment Lower Extremity Assessment: RLE deficits/detail;LLE deficits/detail RLE Deficits / Details: AROM generally WFL though some issues with knee pain chronic, strength grossly 4/5; sensation intact on feet to light touch localizing with eyes closed RLE Sensation: WNL LLE Deficits / Details: AROM generally WFL though some issues with knee pain chronic, strength grossly 4/5; sensation intact to light touch, reports better than R LLE Sensation: WNL    Cervical / Trunk Assessment Cervical / Trunk Assessment: Kyphotic  Communication   Communication: No difficulties  Cognition Arousal/Alertness: Awake/alert Behavior During Therapy: WFL for tasks assessed/performed Overall Cognitive Status: Within Functional Limits  for tasks assessed                                 General Comments: some difficulty recalling names of procedures she had looking to daughter to remember, does have good memory of details when she fell last year        General Comments General comments (skin integrity, edema, etc.): daughter in the room initially helping with history    Exercises     Assessment/Plan    PT Assessment Patient needs continued PT services  PT Problem List Decreased balance;Decreased strength;Decreased knowledge of use of DME;Decreased safety awareness;Decreased activity tolerance;Decreased mobility;Cardiopulmonary status limiting activity       PT Treatment Interventions DME instruction;Functional mobility training;Balance training;Patient/family education;Therapeutic activities;Gait training;Stair training;Therapeutic exercise    PT Goals (Current goals can be found in the Care Plan section)  Acute Rehab PT Goals Patient Stated Goal: To return home and for independent mobility. PT Goal Formulation: With family Time For Goal Achievement: 04/08/23 Potential to Achieve Goals: Good    Frequency Min 1X/week     Co-evaluation               AM-PAC PT "6 Clicks" Mobility  Outcome Measure Help needed turning from your back to your side while in a flat bed without using bedrails?: A Little Help needed moving from lying on your back to sitting on the side of a flat bed without using  bedrails?: A Little Help needed moving to and from a bed to a chair (including a wheelchair)?: A Little Help needed standing up from a chair using your arms (e.g., wheelchair or bedside chair)?: A Little Help needed to walk in hospital room?: A Little Help needed climbing 3-5 steps with a railing? : Total 6 Click Score: 16    End of Session Equipment Utilized During Treatment: Gait belt Activity Tolerance: Patient tolerated treatment well Patient left: in chair;with chair alarm set;with call  bell/phone within reach   PT Visit Diagnosis: Other abnormalities of gait and mobility (R26.89);Muscle weakness (generalized) (M62.81);Pain Pain - Right/Left: Right Pain - part of body: Knee    Time: 4696-2952 PT Time Calculation (min) (ACUTE ONLY): 36 min   Charges:   PT Evaluation $PT Eval Moderate Complexity: 1 Mod PT Treatments $Gait Training: 8-22 mins PT General Charges $$ ACUTE PT VISIT: 1 Visit         Sheran Lawless, PT Acute Rehabilitation Services [phone] 03/25/2023   Elray Mcgregor 03/25/2023, 1:05 PM

## 2023-03-26 LAB — CUP PACEART REMOTE DEVICE CHECK
Date Time Interrogation Session: 20240714231252
Implantable Pulse Generator Implant Date: 20240506

## 2023-03-26 NOTE — Therapy (Signed)
OUTPATIENT PHYSICAL THERAPY LOWER EXTREMITY EVALUATION   Patient Name: [name] MRN: [id number] DOB:[birth year], 89 y.o., female Today's Date: 03/26/2023  END OF SESSION:   Past Medical History:  Diagnosis Date   Arthritis    CKD (chronic kidney disease), stage III (HCC)    Complete heart block (HCC) 11/16/2016   a. transient during NSTEMI, resolved with revascularization.   Coronary artery disease 11/2009   a. with prior stent placement to the ramus intermedius and RCA. b. NSTEMI 11/2016 s/p DES to DES to distal Cx with moderate residual dz.   CVA (cerebral vascular accident) [facility])    Essential hypertension    Mixed hyperlipidemia    Non-ST elevation (NSTEMI) myocardial infarction (HCC) 11/16/2016   Obesity    Osteoarthritis    Reflux esophagitis    Sleep apnea 09/27/2010   Untreated, REM 64.7/hr AHI 18.5/hr RDI 19.2/hr. Patuent refused CPAP therapy   Past Surgical History:  Procedure Laterality Date   CHOLECYSTECTOMY     CORONARY ANGIOPLASTY WITH STENT PLACEMENT  2007   A 3.0x62mm CYPHER stent post dilated to 3.29 mm the 100% occlusion was reduced to 0%   CORONARY STENT INTERVENTION N/A 11/16/2016   Procedure: Coronary Stent Intervention;  Surgeon: Tonny Bollman, MD;  Location: [facility] INVASIVE CV LAB;  Service: Cardiovascular;  Laterality: N/A;   LEFT HEART CATH AND CORONARY ANGIOGRAPHY N/A 11/16/2016   Procedure: Left Heart Cath and Coronary Angiography;  Surgeon: Tonny Bollman, MD;  Location: [facility] INVASIVE CV LAB;  Service: Cardiovascular;  Laterality: N/A;   LEFT HEART CATHETERIZATION WITH CORONARY ANGIOGRAM N/A 07/12/2014   Procedure: LEFT HEART CATHETERIZATION WITH CORONARY ANGIOGRAM;  Surgeon: Micheline Chapman, MD;  Location: [facility] CATH LAB;  Service: Cardiovascular;  Laterality: N/A;   TEMPORARY PACEMAKER N/A 11/16/2016   Procedure: Temporary Pacemaker;  Surgeon: Tonny Bollman, MD;  Location: [facility] INVASIVE CV LAB;  Service: Cardiovascular;  Laterality: N/A;   Patient Active  Problem List   Diagnosis Date Noted   Numbness and tingling of both feet 03/23/2023   Hypoalbuminemia due to protein-calorie malnutrition (HCC) 03/23/2023   Bilateral primary osteoarthritis of knee 11/01/2022   Encounter for general adult medical examination with abnormal findings 11/01/2022   Chronic kidney disease, stage 3b (HCC) 08/21/2022   Left pontine stroke (HCC) 11/06/2021   Dyslipidemia 11/06/2021   GERD without esophagitis 11/06/2021   CKD (chronic kidney disease), stage III (HCC) 11/23/2016   Pericardial effusion 11/23/2016   Complete heart block (HCC) 11/16/2016   Non-ST elevation (NSTEMI) myocardial infarction (HCC) 11/16/2016   Renal insufficiency 11/03/2015   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 09/06/2015   Uncontrolled hypertension 08/31/2015   HTN (hypertension), malignant    Symptomatic bradycardia    Mixed hyperlipidemia    Epigastric fullness    Near syncope 08/28/2015   CAD S/P PCI    Essential hypertension 03/05/2013   Obesity (BMI 30-39.9) 03/05/2013   Hyperlipidemia with target LDL less than 70 03/05/2013   Obstructive sleep apnea 03/05/2013    PCP: Delmar Landau, MD  REFERRING PROVIDER: Rolly Salter, MD  REFERRING DIAG: Outpt referral for PT/OT eval and treat- pt with decreased mobility due to generalized weakness and decreased balance  THERAPY DIAG:  No diagnosis found.  Rationale for Evaluation and Treatment: Rehabilitation  ONSET DATE: ***  SUBJECTIVE:   SUBJECTIVE STATEMENT: ***  PERTINENT HISTORY: *** PAIN:  Are you having pain? {OPRCPAIN:27236}  PRECAUTIONS: {Therapy precautions:24002}  RED FLAGS: {PT Red Flags:29287}   WEIGHT BEARING  RESTRICTIONS: {Yes ***/No:24003}  FALLS:  Has patient fallen in last 6 months? {fallsyesno:27318}  LIVING ENVIRONMENT: Lives with: {OPRC lives with:25569::"lives with their family"} Lives in: {Lives in:25570} Stairs: {opstairs:27293} Has following  equipment at home: {Assistive devices:23999}  OCCUPATION: ***  PLOF: {PLOF:24004}  PATIENT GOALS: ***  NEXT MD VISIT: ***  OBJECTIVE:   DIAGNOSTIC FINDINGS: ***  PATIENT SURVEYS:  {rehab surveys:24030}  COGNITION: Overall cognitive status: {cognition:24006}     SENSATION: {sensation:27233}  EDEMA:  {edema:24020}  MUSCLE LENGTH: Hamstrings: Right *** deg; Left *** deg Thomas test: Right *** deg; Left *** deg  POSTURE: {posture:25561}  PALPATION: ***  LOWER EXTREMITY ROM:  {AROM/PROM:27142} ROM Right eval Left eval  Hip flexion    Hip extension    Hip abduction    Hip adduction    Hip internal rotation    Hip external rotation    Knee flexion    Knee extension    Ankle dorsiflexion    Ankle plantarflexion    Ankle inversion    Ankle eversion     (Blank rows = not tested)  LOWER EXTREMITY MMT:  MMT Right eval Left eval  Hip flexion    Hip extension    Hip abduction    Hip adduction    Hip internal rotation    Hip external rotation    Knee flexion    Knee extension    Ankle dorsiflexion    Ankle plantarflexion    Ankle inversion    Ankle eversion     (Blank rows = not tested)  LOWER EXTREMITY SPECIAL TESTS:  {LEspecialtests:26242}  FUNCTIONAL TESTS:  {Functional tests:24029}  GAIT: Distance walked: *** Assistive device utilized: {Assistive devices:23999} Level of assistance: {Levels of assistance:24026} Comments: ***   TODAY'S TREATMENT:                                                                                                                              DATE: ***    PATIENT EDUCATION:  Education details: *** Person educated: {Person educated:25204} Education method: {Education Method:25205} Education comprehension: {Education Comprehension:25206}  HOME EXERCISE PROGRAM: ***  ASSESSMENT:  CLINICAL IMPRESSION: Patient is a *** y.o. *** who was seen today for physical therapy evaluation and treatment for ***.    OBJECTIVE IMPAIRMENTS: {opptimpairments:25111}.   ACTIVITY LIMITATIONS: {activitylimitations:27494}  PARTICIPATION LIMITATIONS: {participationrestrictions:25113}  PERSONAL FACTORS: {Personal factors:25162} are also affecting patient's functional outcome.   REHAB POTENTIAL: {rehabpotential:25112}  CLINICAL DECISION MAKING: {clinical decision making:25114}  EVALUATION COMPLEXITY: {Evaluation complexity:25115}   GOALS: Goals reviewed with patient? {yes/no:20286}  SHORT TERM GOALS: Target date: *** *** Baseline: Goal status: INITIAL  2.  *** Baseline:  Goal status: INITIAL  3.  *** Baseline:  Goal status: INITIAL  4.  *** Baseline:  Goal status: INITIAL  5.  *** Baseline:  Goal status: INITIAL  6.  *** Baseline:  Goal status: INITIAL  LONG TERM GOALS: Target date: ***  *** Baseline:  Goal status: INITIAL  2.  *** Baseline:  Goal status: INITIAL  3.  *** Baseline:  Goal status: INITIAL  4.  *** Baseline:  Goal status: INITIAL  5.  *** Baseline:  Goal status: INITIAL  6.  *** Baseline:  Goal status: INITIAL   PLAN:  PT FREQUENCY: {rehab frequency:25116}  PT DURATION: {rehab duration:25117}  PLANNED INTERVENTIONS: {rehab planned interventions:25118::"Therapeutic exercises","Therapeutic activity","Neuromuscular re-education","Balance training","Gait training","Patient/Family education","Self Care","Joint mobilization"}  PLAN FOR NEXT SESSION: ***   Marisue Brooklyn, PT 03/26/2023, 2:46 PM

## 2023-03-27 ENCOUNTER — Other Ambulatory Visit: Payer: Self-pay

## 2023-03-27 ENCOUNTER — Ambulatory Visit (HOSPITAL_COMMUNITY): Payer: Medicare HMO | Attending: Internal Medicine

## 2023-03-27 ENCOUNTER — Telehealth: Payer: Self-pay

## 2023-03-27 DIAGNOSIS — M6281 Muscle weakness (generalized): Secondary | ICD-10-CM | POA: Insufficient documentation

## 2023-03-27 DIAGNOSIS — M542 Cervicalgia: Secondary | ICD-10-CM | POA: Insufficient documentation

## 2023-03-27 DIAGNOSIS — I639 Cerebral infarction, unspecified: Secondary | ICD-10-CM | POA: Diagnosis not present

## 2023-03-27 DIAGNOSIS — R2689 Other abnormalities of gait and mobility: Secondary | ICD-10-CM | POA: Insufficient documentation

## 2023-03-27 NOTE — Transitions of Care (Post Inpatient/ED Visit) (Signed)
03/27/2023  Name: [name] MRN: [id number] DOB: 1933/11/08  Today's TOC FU Call Status: Today's TOC FU Call Status:: Successful TOC FU Call Competed TOC FU Call Complete Date: 03/27/23  Transition Care Management Follow-up Telephone Call Date of Discharge: 03/25/23 Discharge Facility: [facility]) Type of Discharge: Inpatient Admission Primary Inpatient Discharge Diagnosis:: Neurocognitive Deficits How have you been since you were released from the hospital?: Better Any questions or concerns?: No  Items Reviewed: Did you receive and understand the discharge instructions provided?: Yes Medications obtained,verified, and reconciled?: Yes (Medications Reviewed) Any new allergies since your discharge?: No Dietary orders reviewed?: Yes Type of Diet Ordered:: Low sodium, Heart Healthy Do you have support at home?: Yes People in Home: child(ren), adult Name of Support/Comfort Primary Source: Anna  Medications Reviewed Today: Medications Reviewed Today     Reviewed by Jodelle Gross, RN (Case Manager) on 03/27/23 at 1333  Med List Status: <None>   Medication Order Taking? Sig Documenting Provider Last Dose Status Informant  acetaminophen (TYLENOL) 500 MG tablet [id number]  Take 2 tablets (1,000 mg total) by mouth every 6 (six) hours as needed for mild pain or moderate pain. Rolly Salter, MD  Active   clopidogrel (PLAVIX) 75 MG tablet [id number] Yes TAKE 1 TABLET BY MOUTH EVERY DAY Jonelle Sidle, MD Taking Active Child, Pharmacy Records  dexamethasone (DECADRON) 2 MG tablet [id number] Yes Take 4 mg three times daily for 5 days,Take 4 mg twice daily for 5 days,Take 4 mg daily for 5 days,Take 2 mg daily for 5 days then stop Rolly Salter, MD Taking Active   hydrALAZINE (APRESOLINE) 25 MG tablet [id number] Yes Take 1 tablet (25 mg total) by mouth in the morning and at bedtime. Jonelle Sidle, MD Taking Active Child, Pharmacy Records  magnesium oxide (MAG-OX) 400 (240 Mg)  MG tablet [id number] Yes Take 1 tablet (400 mg total) by mouth daily. Rolly Salter, MD Taking Active   Menthol, Topical Analgesic, (BIOFREEZE ROLL-ON) 4 % GEL [id number]  Apply 1 application. topically daily as needed (pain). [provider]  Active Child, Pharmacy Records           Med Note Tyrell Antonio   Sat Mar 23, 2023  6:25 PM) spray  metoprolol succinate (TOPROL XL) 25 MG 24 hr tablet [id number] No Take 0.5 tablets (12.5 mg total) by mouth daily.  Patient not taking: Reported on 03/27/2023   Sharlene Dory, NP Not Taking Active Child, Pharmacy Records           Med Note Tyrell Antonio   Sat Mar 23, 2023  6:22 PM) Pt has not started waiting to see which one doctor would like for her take.  nitroGLYCERIN (NITROSTAT) 0.4 MG SL tablet [id number]  PLACE 1 TABLET UNDER THE TONGUE EVERY 5 MINUTES AS NEEDED. Jonelle Sidle, MD  Active Child, Pharmacy Records  pantoprazole (PROTONIX) 40 MG tablet [id number] Yes TAKE 1 TABLET BY MOUTH EVERY DAY Jonelle Sidle, MD Taking Active Child, Pharmacy Records  rosuvastatin (CRESTOR) 40 MG tablet [id number] Yes TAKE 1 TABLET BY MOUTH EVERY DAY Jonelle Sidle, MD Taking Active Child, Pharmacy Records            Home Care and Equipment/Supplies: Were Home Health Services Ordered?: No Any new equipment or medical supplies ordered?: No  Functional Questionnaire: Do you need assistance with bathing/showering or dressing?: No Do you need assistance with meal preparation?: No Do you need assistance with eating?: No Do  you have difficulty maintaining continence: No Do you need assistance with getting out of bed/getting out of a chair/moving?: No Do you have difficulty managing or taking your medications?: No  Follow up appointments reviewed: PCP Follow-up appointment confirmed?: NA MD Provider Line [phone] Given: No Specialist Hospital Follow-up appointment confirmed?: Yes Date of Specialist follow-up appointment?:  03/28/23 Follow-Up Specialty Provider:: Lanora Manis Peck-Cardiology Do you need transportation to your follow-up appointment?: No Do you understand care options if your condition(s) worsen?: Yes-patient verbalized understanding  SDOH Interventions Today    Flowsheet Row Most Recent Value  SDOH Interventions   Food Insecurity Interventions Intervention Not Indicated  Housing Interventions Intervention Not Indicated  Transportation Interventions Intervention Not Indicated  Utilities Interventions Intervention Not Indicated      Interventions Today    Flowsheet Row Most Recent Value  Nutrition Interventions   Nutrition Discussed/Reviewed Nutrition Discussed, Nutrition Reviewed, Adding fruits and vegetables      Jodelle Gross, RN, BSN, CCM Care Management Coordinator [facility] Phone: [phone]/Fax: [phone]

## 2023-03-28 ENCOUNTER — Ambulatory Visit: Payer: Medicare HMO | Admitting: Nurse Practitioner

## 2023-04-01 ENCOUNTER — Encounter: Payer: Self-pay | Admitting: Internal Medicine

## 2023-04-01 ENCOUNTER — Telehealth: Payer: Self-pay

## 2023-04-01 NOTE — Telephone Encounter (Signed)
I think maybe this should have went to stacy, Charity fundraiser.   Thanks, Marliss Czar

## 2023-04-01 NOTE — Telephone Encounter (Signed)
Following alert received from [employer] received for ILR alert for AF, appears ongoing from 7/20, controlled rates.  Reviewed with Dr. Nelly Laurence in office who agreed AF was present and patient needs AF clinic referral. Called patient to discuss, patient requested I call her daughter Tobi Bastos to discuss. Attempted to contact Tobi Bastos, [facility].

## 2023-04-01 NOTE — Telephone Encounter (Signed)
Spoke to patients daughter Tobi Bastos, advised of AF noted on ILR and Dr. Nelly Laurence advised AF clinic. Agreeable. Advised someone will call for apt.   Call daughter Tobi Bastos cell [phone]. [phone] (work) Tobi Bastos advised if you call her work to advise staff you need to speak with Tobi Bastos immediately.

## 2023-04-03 ENCOUNTER — Ambulatory Visit (HOSPITAL_COMMUNITY): Payer: Medicare HMO

## 2023-04-03 DIAGNOSIS — I639 Cerebral infarction, unspecified: Secondary | ICD-10-CM

## 2023-04-03 DIAGNOSIS — M542 Cervicalgia: Secondary | ICD-10-CM | POA: Diagnosis not present

## 2023-04-03 DIAGNOSIS — M6281 Muscle weakness (generalized): Secondary | ICD-10-CM

## 2023-04-03 DIAGNOSIS — R2689 Other abnormalities of gait and mobility: Secondary | ICD-10-CM | POA: Diagnosis not present

## 2023-04-03 NOTE — Therapy (Signed)
OUTPATIENT PHYSICAL THERAPY LOWER EXTREMITY AND CERVICAL EVALUATION   Patient Name: [name] MRN: [id number] DOB:23-Nov-1933, 89 y.o., female Today's Date: 04/03/2023  END OF SESSION:  PT End of Session - 04/03/23 0731     Visit Number 2    Number of Visits 8    Date for PT Re-Evaluation 04/24/23    Authorization Type [employer] HMO    Progress Note Due on Visit 10    PT Start Time 0730    PT Stop Time 0808    PT Time Calculation (min) 38 min    Activity Tolerance Patient tolerated treatment well    Behavior During Therapy WFL for tasks assessed/performed             Past Medical History:  Diagnosis Date   Arthritis    CKD (chronic kidney disease), stage III (HCC)    Complete heart block (HCC) 11/16/2016   a. transient during NSTEMI, resolved with revascularization.   Coronary artery disease 11/2009   a. with prior stent placement to the ramus intermedius and RCA. b. NSTEMI 11/2016 s/p DES to DES to distal Cx with moderate residual dz.   CVA (cerebral vascular accident) [facility])    Essential hypertension    Mixed hyperlipidemia    Non-ST elevation (NSTEMI) myocardial infarction (HCC) 11/16/2016   Obesity    Osteoarthritis    Reflux esophagitis    Sleep apnea 09/27/2010   Untreated, REM 64.7/hr AHI 18.5/hr RDI 19.2/hr. Patuent refused CPAP therapy   Past Surgical History:  Procedure Laterality Date   CHOLECYSTECTOMY     CORONARY ANGIOPLASTY WITH STENT PLACEMENT  2007   A 3.0x68mm CYPHER stent post dilated to 3.29 mm the 100% occlusion was reduced to 0%   CORONARY STENT INTERVENTION N/A 11/16/2016   Procedure: Coronary Stent Intervention;  Surgeon: Tonny Bollman, MD;  Location: [facility] INVASIVE CV LAB;  Service: Cardiovascular;  Laterality: N/A;   LEFT HEART CATH AND CORONARY ANGIOGRAPHY N/A 11/16/2016   Procedure: Left Heart Cath and Coronary Angiography;  Surgeon: Tonny Bollman, MD;  Location: [facility] INVASIVE CV LAB;  Service: Cardiovascular;  Laterality: N/A;   LEFT  HEART CATHETERIZATION WITH CORONARY ANGIOGRAM N/A 07/12/2014   Procedure: LEFT HEART CATHETERIZATION WITH CORONARY ANGIOGRAM;  Surgeon: Micheline Chapman, MD;  Location: [facility] CATH LAB;  Service: Cardiovascular;  Laterality: N/A;   TEMPORARY PACEMAKER N/A 11/16/2016   Procedure: Temporary Pacemaker;  Surgeon: Tonny Bollman, MD;  Location: [facility] INVASIVE CV LAB;  Service: Cardiovascular;  Laterality: N/A;   Patient Active Problem List   Diagnosis Date Noted   Numbness and tingling of both feet 03/23/2023   Hypoalbuminemia due to protein-calorie malnutrition (HCC) 03/23/2023   Bilateral primary osteoarthritis of knee 11/01/2022   Encounter for general adult medical examination with abnormal findings 11/01/2022   Chronic kidney disease, stage 3b (HCC) 08/21/2022   Left pontine stroke (HCC) 11/06/2021   Dyslipidemia 11/06/2021   GERD without esophagitis 11/06/2021   CKD (chronic kidney disease), stage III (HCC) 11/23/2016   Pericardial effusion 11/23/2016   Complete heart block (HCC) 11/16/2016   Non-ST elevation (NSTEMI) myocardial infarction (HCC) 11/16/2016   Renal insufficiency 11/03/2015   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 09/06/2015   Uncontrolled hypertension 08/31/2015   HTN (hypertension), malignant    Symptomatic bradycardia    Mixed hyperlipidemia    Epigastric fullness    Near syncope 08/28/2015   CAD S/P PCI    Essential hypertension 03/05/2013   Obesity (BMI 30-39.9) 03/05/2013  Hyperlipidemia with target LDL less than 70 03/05/2013   Obstructive sleep apnea 03/05/2013    PCP: Delmar Landau, MD  REFERRING PROVIDER: Rolly Salter, MD  REFERRING DIAG: Outpt referral for PT/OT eval and treat- pt with decreased mobility due to generalized weakness and decreased balance  THERAPY DIAG:  Neck pain  Muscle weakness (generalized)  Other abnormalities of gait and mobility  Cerebrovascular accident (CVA), unspecified mechanism  (HCC)  Rationale for Evaluation and Treatment: Rehabilitation  ONSET DATE: 02/23/23  SUBJECTIVE:   SUBJECTIVE STATEMENT: Patient states she is not feeling well this morning.  She is feeling SOB and tired.  She has to go to [facility] tomorrow as a new patient for the afib clinci.  Her Loop Recorder went off; states they told her she was in afib.  She is anxious about her appt tomorrow and about afib. 6/10 pain right shoulder today.    Eval:Went to hospital Saturday due to numbness in hands; ruled out stroke; d/c 6/15 from hospital; recommended therapy for strength and balance.  Also she was having right side arm pain that they thought is coming from her neck; had a fall last year and hurt her right leg and broke some toes; also has some arthritis in right shoulder; reports generally some pain right side shoulder and lower extremity  PERTINENT HISTORY: Feb 2023;  December 2023; small strokes; loop recorder placed in May  3 stents in heart  PAIN:  Are you having pain? Yes: NPRS scale: 3-4/10 Pain location: right side shoulder and right leg Pain description: sore and aching Aggravating factors: unknown Relieving factors: medication  PRECAUTIONS: Fall  WEIGHT BEARING RESTRICTIONS: No  FALLS:  Has patient fallen in last 6 months? No  LIVING ENVIRONMENT: Lives with: lives with their daughter Lives in: House/apartment Stairs: Yes: External: 6 steps; on right going up and on left going up Has following equipment at home: Dan Humphreys - 2 wheeled, Environmental consultant - 4 wheeled, Wheelchair (manual), shower chair, and bed side commode  OCCUPATION: retired  PLOF: Independent with household mobility with device  PATIENT GOALS: get back to moving and being independent as possible; get back to cooking  NEXT MD VISIT: 8/23  OBJECTIVE:   DIAGNOSTIC FINDINGS:  CLINICAL DATA:  Myelopathy, acute, cervical spine.   EXAM: MRI CERVICAL SPINE WITHOUT CONTRAST   TECHNIQUE: Multiplanar, multisequence MR  imaging of the cervical spine was performed. No intravenous contrast was administered.   COMPARISON:  MRI of the cervical spine 11/26/2021.   FINDINGS: Alignment: 3 mm anterolisthesis at C4-5 scratched at 3 mm anterolisthesis is present at C4-5. No other significant listhesis is present. Straightening of the normal cervical lordosis is again seen.   Vertebrae: Chronic loss of vertebral body heights at C6 and C7 is stable. A remote superior endplate fracture at T3 is new since the prior exam, but not acute.   Cord: Progressive cord compression is present at C2-3. The STIR sequence is distorted by patient motion. Central T2 cord signal changes are present at C2-3 and C3-4.   Posterior Fossa, vertebral arteries, paraspinal tissues: Craniocervical junction is normal. Flow is present in the vertebral arteries bilaterally. Visualized intracranial contents are normal.   Disc levels:   C1-2: Negative.   C2-3: A high-grade central canal stenosis is present. Canal is narrowed to less than 4 mm. Moderate foraminal stenosis is present bilaterally.   C3-4: High-grade stenosis is present. The canal and cord are narrowed to 5 mm. Severe left and moderate right foraminal stenosis is  present.   C4-5: A broad-based disc osteophyte complex is present. The canal is narrowed to 8 mm. Moderate foraminal stenosis is worse left than right.   C5-6: A broad-based disc osteophyte complex is present. The central canal is narrowed to 7 mm. No abnormal cord signal is present. Moderate foraminal stenosis is worse on the left.   C6-7: A broad-based disc osteophyte complex is present. The central canal is narrowed to 6 mm. Moderate foraminal stenosis is present bilaterally.   C7-T1: No significant central canal stenosis is present. Moderate right and mild left foraminal narrowing is present.   IMPRESSION: 1. Progressive multilevel spondylosis of the cervical spine as described. 2. Progressive  high-grade central canal stenosis at C2-3 and C3-4 with central T2 signal changes suggesting cord edema or myelomalacia. 3. Moderate foraminal stenosis bilaterally at C2-3. 4. Severe left and moderate right foraminal stenosis at C3-4. 5. Moderate foraminal stenosis bilaterally at C4-5 and C5-6 is worse on the left. 6. Moderate foraminal stenosis bilaterally at C6-7. 7. Moderate right and mild left foraminal narrowing at C7-T1.   PATIENT SURVEYS:  NDI 19/50  COGNITION: Overall cognitive status:  mild memory issues per daughter      SENSATION: Some complaint of N/T right hand  EDEMA:  Noted swelling right foot and ankle   POSTURE: rounded shoulders, forward head, and flexed trunk   PALPATION: Tight and tender right upper trap   CERVICAL ROM:   Active ROM AROM (deg) eval  Flexion 27  Extension 25*  Right lateral flexion 40  Left lateral flexion 32  Right rotation 59  Left rotation 64   (Blank rows = not tested) LOWER EXTREMITY ROM:  Active ROM Right eval Left eval  Hip flexion    Hip extension    Hip abduction    Hip adduction    Hip internal rotation    Hip external rotation    Knee flexion    Knee extension    Ankle dorsiflexion    Ankle plantarflexion    Ankle inversion    Ankle eversion    Shoulder flexion Limited right shoulder flexion ~120    (Blank rows = not tested)  LOWER AND UPPER EXTREMITY MMT:  MMT Right eval Left eval  Hip flexion 4- 4  Hip extension    Hip abduction    Hip adduction    Hip internal rotation    Hip external rotation    Knee flexion    Knee extension 4+ 4+  Ankle dorsiflexion 4+ 4+  Ankle plantarflexion    Ankle inversion    Ankle eversion    Shoulder flexion 3- 4+  Elbow flexion 4 4+  Elbow extension 4 4+  grip 5 5   (Blank rows = not tested)  FUNCTIONAL TESTS:  5 times sit to stand: 27.51 sec minimal use of hands SLS 0 each side  GAIT: Distance walked: 50 ft in clinic Assistive device utilized:  Walker - 2 wheeled Level of assistance: SBA Comments: accompanied by daughter   TODAY'S TREATMENT:  DATE:  04/03/23 Seated  HR 84 irregular  Review of HEP and goals Seated: Upper trap stretch 3 x 20" each Heel/toe raises x 10 LAQ's x 10 each Marching x 10 HR 84 irregular to this point with exercise Shoulder ER with RTB 2 x 10 Shoulder HABD RTB 2 x 10 Trial of right shoulder punch to ceiling; d/c due to pain and audible crepitus Bicep curls 1# x 10 each      03/27/23 physical therapy evaluation and HEP    PATIENT EDUCATION:  Education details: Patient educated on exam findings, POC, scope of PT, HEP, and what to expect next visit. Person educated: Patient Education method: Explanation, Demonstration, and Handouts Education comprehension: verbalized understanding, returned demonstration, verbal cues required, and tactile cues required   HOME EXERCISE PROGRAM: Access Code: [email or web address] URL: [email or web address] Date: 03/27/2023 Prepared by: AP - Rehab  Exercises - Sit to Stand  - 2 x daily - 7 x weekly - 5 sets - 5 reps - Seated Gentle Upper Trapezius Stretch  - 2 x daily - 7 x weekly - 1 sets - 5 reps - 20 sec hold - Standing Single Leg Stance with Counter Support  - 2 x daily - 7 x weekly - 1 sets - 5 reps - 10 sec hold  ASSESSMENT:  CLINICAL IMPRESSION: Today's session started with a review of HEP and goals; patient verbalizes understanding and agreement with set rehab goals.  Patient with some anxiety regarding afib so mostly focused on sitting exercise today.  Trial of right shoulder punch to ceiling; d/c due to increased pain and audible crepitus.  Patient with some noted sweating and anxiety during treatment.  Added shoulder strengthening to program today; updated HEP and issued RTB for home use.  Patient will benefit from continued  skilled therapy services to address deficits and promote return to optimal function.       Eval:Patient is a 89 y.o. female who was seen today for physical therapy evaluation and treatment for general weakness, impaired balance and neck and shoulder pain. Patient demonstrates decreased strength, balance deficits and gait abnormalities which are negatively impacting patient ability to perform ADLs and functional mobility tasks. Patient will benefit from skilled physical therapy services to address these deficits to improve level of function with ADLs, functional mobility tasks, and reduce risk for falls.    OBJECTIVE IMPAIRMENTS: Abnormal gait, decreased activity tolerance, decreased balance, decreased endurance, decreased knowledge of condition, decreased mobility, difficulty walking, decreased ROM, decreased strength, increased edema, increased fascial restrictions, impaired perceived functional ability, impaired UE functional use, and pain.   ACTIVITY LIMITATIONS: carrying, lifting, bending, sitting, standing, squatting, sleeping, stairs, transfers, bed mobility, toileting, dressing, reach over head, locomotion level, and caring for others  PARTICIPATION LIMITATIONS: meal prep, cleaning, laundry, shopping, community activity, yard work, and church  REHAB POTENTIAL: Good  CLINICAL DECISION MAKING: Evolving/moderate complexity  EVALUATION COMPLEXITY: Moderate   GOALS: Goals reviewed with patient? No  SHORT TERM GOALS: Target date: 04/10/2023 patient will be independent with initial HEP   Baseline: Goal status: INITIAL  2.  Patient will self report 30% improvement to improve tolerance for functional activity  Baseline:  Goal status: INITIAL  LONG TERM GOALS: Target date: 04/24/2023  Patient will be independent in self management strategies to improve quality of life and functional outcomes.  Baseline:  Goal status: INITIAL  2.  Patient will self report 50% improvement to  improve tolerance for functional activity  Baseline:  Goal status: INITIAL  3.  Patient  will be able to stand x 10" on each leg to demonstrate improved functional balanve Baseline:  Goal status: INITIAL  4.  Patient will increase right UE MMT's to 4+ to 5/5 to improve ability to cook a light meal at home Baseline:  Goal status: INITIAL  5.  Patient will improve 5 times sit to stand score from 27.51 sec to 22 sec to demonstrate improved functional mobility and increased lower extremity strength.  Baseline:  Goal status: INITIAL PLAN:  PT FREQUENCY: 2x/week  PT DURATION: 4 weeks  PLANNED INTERVENTIONS: Therapeutic exercises, Therapeutic activity, Neuromuscular re-education, Balance training, Gait training, Patient/Family education, Joint manipulation, Joint mobilization, Stair training, Orthotic/Fit training, DME instructions, Aquatic Therapy, Dry Needling, Electrical stimulation, Spinal manipulation, Spinal mobilization, Cryotherapy, Moist heat, Compression bandaging, scar mobilization, Splintting, Taping, Traction, Ultrasound, Ionotophoresis 4mg /ml Dexamethasone, and Manual therapy   PLAN FOR NEXT SESSION: gait, balance, cervical spine mobility, Right shoulder strength and mobility; postural strengthening, general strengthening; functional strength.     8:10 AM, 04/03/23 Traeh Milroy Small Dynisha Due MPT [facility] [place] [phone]

## 2023-04-04 ENCOUNTER — Encounter (HOSPITAL_COMMUNITY): Payer: Self-pay | Admitting: Physician Assistant

## 2023-04-04 ENCOUNTER — Ambulatory Visit (HOSPITAL_COMMUNITY)
Admission: RE | Admit: 2023-04-04 | Discharge: 2023-04-04 | Disposition: A | Discharge status: Home / Self Care | Payer: Medicare HMO | Source: Ambulatory Visit | Attending: Physician Assistant | Admitting: Physician Assistant

## 2023-04-04 VITALS — BP 126/72 | HR 58 | Ht 65.0 in | Wt 182.6 lb

## 2023-04-04 DIAGNOSIS — G4733 Obstructive sleep apnea (adult) (pediatric): Secondary | ICD-10-CM | POA: Diagnosis not present

## 2023-04-04 DIAGNOSIS — Z7901 Long term (current) use of anticoagulants: Secondary | ICD-10-CM | POA: Diagnosis not present

## 2023-04-04 DIAGNOSIS — Z79899 Other long term (current) drug therapy: Secondary | ICD-10-CM | POA: Diagnosis not present

## 2023-04-04 DIAGNOSIS — I2510 Atherosclerotic heart disease of native coronary artery without angina pectoris: Secondary | ICD-10-CM | POA: Insufficient documentation

## 2023-04-04 DIAGNOSIS — N189 Chronic kidney disease, unspecified: Secondary | ICD-10-CM | POA: Diagnosis not present

## 2023-04-04 DIAGNOSIS — E785 Hyperlipidemia, unspecified: Secondary | ICD-10-CM | POA: Diagnosis not present

## 2023-04-04 DIAGNOSIS — I129 Hypertensive chronic kidney disease with stage 1 through stage 4 chronic kidney disease, or unspecified chronic kidney disease: Secondary | ICD-10-CM | POA: Insufficient documentation

## 2023-04-04 DIAGNOSIS — D6869 Other thrombophilia: Secondary | ICD-10-CM | POA: Diagnosis not present

## 2023-04-04 DIAGNOSIS — Z8673 Personal history of transient ischemic attack (TIA), and cerebral infarction without residual deficits: Secondary | ICD-10-CM | POA: Diagnosis not present

## 2023-04-04 DIAGNOSIS — I480 Paroxysmal atrial fibrillation: Secondary | ICD-10-CM | POA: Diagnosis not present

## 2023-04-04 DIAGNOSIS — Z955 Presence of coronary angioplasty implant and graft: Secondary | ICD-10-CM | POA: Insufficient documentation

## 2023-04-04 MED ORDER — APIXABAN 5 MG PO TABS: 5.0000 mg | ORAL_TABLET | Freq: Two times a day (BID) | ORAL | 3 refills | Status: DC

## 2023-04-04 NOTE — Patient Instructions (Signed)
Start Eliquis 5mg twice a day 

## 2023-04-04 NOTE — Progress Notes (Addendum)
Primary Care Physician: [name], MD Primary Cardiologist: Nona Dell, MD Electrophysiologist: Lewayne Bunting, MD  Referring Physician: Dr Fleet Contras [name] is a 89 y.o. female with a history of CAD, HLD, CVA, OSA, CKD, HTN, atrial fibrillation who presents for follow up in the [facility].  The patient had an ILR placed for cryptogenic stroke on 01/14/23 by Dr Ladona Ridgel. The device clinic received an alert for a 10 hour episode that occurred on 03/30/23. Patient was unaware of her arrhythmia. Patient has a CHADS2VASC score of 7.  On follow up today, patient is in SR. No further episodes detected on ILR. There were no specific triggers that she could identify. Of note, she was diagnosed with rheumatic fever as a child.   Today, she denies symptoms of palpitations, chest pain, shortness of breath, orthopnea, PND, lower extremity edema, dizziness, presyncope, syncope, snoring, daytime somnolence, bleeding, or neurologic sequela. The patient is tolerating medications without difficulties and is otherwise without complaint today.    Atrial Fibrillation Risk Factors:  she does have symptoms or diagnosis of sleep apnea. she is not compliant with CPAP therapy. she does have a history of rheumatic fever. she does not have a history of alcohol use. The patient does not have a history of early familial atrial fibrillation or other arrhythmias.  Atrial Fibrillation Management history:  Previous antiarrhythmic drugs: none Previous cardioversions: none Previous ablations: none Anticoagulation history: none  ROS- All systems are reviewed and negative except as per the HPI above.   Physical Exam: BP 126/72   Pulse (!) 58   Ht 5\' 5"  (1.651 m)   Wt 82.8 kg   BMI 30.39 kg/m   GEN: Well nourished, well developed in no acute distress NECK: No JVD; No carotid bruits CARDIAC: Regular rate and rhythm, no murmurs, rubs, gallops RESPIRATORY:  Clear to  auscultation without rales, wheezing or rhonchi  ABDOMEN: Soft, non-tender, non-distended EXTREMITIES:  No edema; No deformity   Wt Readings from Last 3 Encounters:  04/04/23 82.8 kg  03/23/23 82.6 kg  02/11/23 86.7 kg     EKG today demonstrates  SB, RBBB, LAFB Vent. rate 58 BPM PR interval 174 ms QRS duration 122 ms QT/QTcB 448/439 ms  Echo 03/25/23 demonstrated   1. Left ventricular ejection fraction, by estimation, is 65 to 70%. The  left ventricle has normal function. The left ventricle has no regional  wall motion abnormalities. There is mild concentric left ventricular  hypertrophy. Left ventricular diastolic parameters are consistent with Grade I diastolic dysfunction (impaired relaxation).   2. Right ventricular systolic function is normal. The right ventricular  size is normal.   3. Left atrial size was mildly dilated.   4. The mitral valve is normal in structure. Trivial mitral valve  regurgitation. No evidence of mitral stenosis.   5. The aortic valve is tricuspid. There is mild calcification of the  aortic valve. Aortic valve regurgitation is trivial. Aortic valve  sclerosis/calcification is present, without any evidence of aortic  stenosis.   6. The inferior vena cava is normal in size with greater than 50%  respiratory variability, suggesting right atrial pressure of 3 mmHg.    CHA2DS2-VASc Score = 7  The patient's score is based upon: CHF History: 0 HTN History: 1 Diabetes History: 0 Stroke History: 2 Vascular Disease History: 1 Age Score: 2 Gender Score: 1       ASSESSMENT AND PLAN: Paroxysmal Atrial Fibrillation (ICD10:  I48.0) The patient's  CHA2DS2-VASc score is 7, indicating a 11.2% annual risk of stroke.   Patient back in SR. General education about afib provided and questions answered. We also discussed her stroke risk and the risks and benefits of anticoagulation. Will start Eliquis 5 mg BID (Cr < 1.5, weight > 60 kg). Will discuss continuation  of antiplatelet with her primary cardiologist.  Continue Toprol 12.5 mg daily Would recommend repeat cbc and bmet at follow up.  Secondary Hypercoagulable State (ICD10:  D68.69) The patient is at significant risk for stroke/thromboembolism based upon her CHA2DS2-VASc Score of 7.  Start Apixaban (Eliquis).   HTN Stable on current regimen  OSA  Patient declined CPAP  CAD S/p PCI x 3 No anginal symptoms    Follow up with Dr Diona Browner as scheduled.    Addendum: Discussed with Dr Diona Browner, will change Plavix to ASA 81 mg in addition to Eliquis.   Jorja Loa PA-C [facility] [place] [facility] [address] [place], [place] [address] [phone]

## 2023-04-05 ENCOUNTER — Ambulatory Visit (HOSPITAL_COMMUNITY): Payer: Medicare HMO

## 2023-04-05 ENCOUNTER — Other Ambulatory Visit (HOSPITAL_COMMUNITY): Payer: Self-pay | Admitting: *Deleted

## 2023-04-05 DIAGNOSIS — M6281 Muscle weakness (generalized): Secondary | ICD-10-CM

## 2023-04-05 DIAGNOSIS — M542 Cervicalgia: Secondary | ICD-10-CM

## 2023-04-05 DIAGNOSIS — I639 Cerebral infarction, unspecified: Secondary | ICD-10-CM

## 2023-04-05 DIAGNOSIS — R2689 Other abnormalities of gait and mobility: Secondary | ICD-10-CM

## 2023-04-05 MED ORDER — ASPIRIN 81 MG PO TBEC: 81.0000 mg | DELAYED_RELEASE_TABLET | Freq: Every day | ORAL | Status: DC

## 2023-04-05 NOTE — Addendum Note (Signed)
Encounter addended by: Danice Goltz, PA on: 04/05/2023 10:41 AM  Actions taken: Clinical Note Signed

## 2023-04-05 NOTE — Therapy (Signed)
OUTPATIENT PHYSICAL THERAPY LOWER EXTREMITY AND CERVICAL EVALUATION   Patient Name: [name] MRN: [id number] DOB:Sep 09, 1934, 89 y.o., female Today's Date: 04/05/2023  END OF SESSION:  PT End of Session - 04/05/23 0743     Visit Number 3    Number of Visits 8    Date for PT Re-Evaluation 04/24/23    Authorization Type [employer] HMO    Progress Note Due on Visit 10    PT Start Time 0735    PT Stop Time 0815    PT Time Calculation (min) 40 min    Activity Tolerance Patient tolerated treatment well    Behavior During Therapy [facility] for tasks assessed/performed             Past Medical History:  Diagnosis Date   Arthritis    CKD (chronic kidney disease), stage III (HCC)    Complete heart block (HCC) 11/16/2016   a. transient during NSTEMI, resolved with revascularization.   Coronary artery disease 11/2009   a. with prior stent placement to the ramus intermedius and RCA. b. NSTEMI 11/2016 s/p DES to DES to distal Cx with moderate residual dz.   CVA (cerebral vascular accident) [facility])    Essential hypertension    Mixed hyperlipidemia    Non-ST elevation (NSTEMI) myocardial infarction (HCC) 11/16/2016   Obesity    Osteoarthritis    Reflux esophagitis    Sleep apnea 09/27/2010   Untreated, REM 64.7/hr AHI 18.5/hr RDI 19.2/hr. Patuent refused CPAP therapy   Past Surgical History:  Procedure Laterality Date   CHOLECYSTECTOMY     CORONARY ANGIOPLASTY WITH STENT PLACEMENT  2007   A 3.0x29mm CYPHER stent post dilated to 3.29 mm the 100% occlusion was reduced to 0%   CORONARY STENT INTERVENTION N/A 11/16/2016   Procedure: Coronary Stent Intervention;  Surgeon: Tonny Bollman, MD;  Location: [facility] INVASIVE CV LAB;  Service: Cardiovascular;  Laterality: N/A;   LEFT HEART CATH AND CORONARY ANGIOGRAPHY N/A 11/16/2016   Procedure: Left Heart Cath and Coronary Angiography;  Surgeon: Tonny Bollman, MD;  Location: [facility] INVASIVE CV LAB;  Service: Cardiovascular;  Laterality: N/A;   LEFT  HEART CATHETERIZATION WITH CORONARY ANGIOGRAM N/A 07/12/2014   Procedure: LEFT HEART CATHETERIZATION WITH CORONARY ANGIOGRAM;  Surgeon: Micheline Chapman, MD;  Location: [facility] CATH LAB;  Service: Cardiovascular;  Laterality: N/A;   TEMPORARY PACEMAKER N/A 11/16/2016   Procedure: Temporary Pacemaker;  Surgeon: Tonny Bollman, MD;  Location: [facility] INVASIVE CV LAB;  Service: Cardiovascular;  Laterality: N/A;   Patient Active Problem List   Diagnosis Date Noted   Paroxysmal atrial fibrillation (HCC) 04/04/2023   Hypercoagulable state due to paroxysmal atrial fibrillation (HCC) 04/04/2023   Numbness and tingling of both feet 03/23/2023   Hypoalbuminemia due to protein-calorie malnutrition (HCC) 03/23/2023   Bilateral primary osteoarthritis of knee 11/01/2022   Encounter for general adult medical examination with abnormal findings 11/01/2022   Chronic kidney disease, stage 3b (HCC) 08/21/2022   Left pontine stroke (HCC) 11/06/2021   Dyslipidemia 11/06/2021   GERD without esophagitis 11/06/2021   CKD (chronic kidney disease), stage III (HCC) 11/23/2016   Pericardial effusion 11/23/2016   Complete heart block (HCC) 11/16/2016   Non-ST elevation (NSTEMI) myocardial infarction (HCC) 11/16/2016   Renal insufficiency 11/03/2015   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 09/06/2015   Uncontrolled hypertension 08/31/2015   HTN (hypertension), malignant    Symptomatic bradycardia    Mixed hyperlipidemia    Epigastric fullness    Near syncope  08/28/2015   CAD S/P PCI    Essential hypertension 03/05/2013   Obesity (BMI 30-39.9) 03/05/2013   Hyperlipidemia with target LDL less than 70 03/05/2013   Obstructive sleep apnea 03/05/2013    PCP: Delmar Landau, MD  REFERRING PROVIDER: Rolly Salter, MD  REFERRING DIAG: Outpt referral for PT/OT eval and treat- pt with decreased mobility due to generalized weakness and decreased balance  THERAPY DIAG:  Neck pain  Muscle weakness  (generalized)  Other abnormalities of gait and mobility  Cerebrovascular accident (CVA), unspecified mechanism (HCC)  Rationale for Evaluation and Treatment: Rehabilitation  ONSET DATE: 02/23/23  SUBJECTIVE:   SUBJECTIVE STATEMENT: Went to afib clinic yesterday; she states she has not gotten to rest too much so feeling tired  Eval:Went to hospital Saturday due to numbness in hands; ruled out stroke; d/c 6/15 from hospital; recommended therapy for strength and balance.  Also she was having right side arm pain that they thought is coming from her neck; had a fall last year and hurt her right leg and broke some toes; also has some arthritis in right shoulder; reports generally some pain right side shoulder and lower extremity  PERTINENT HISTORY: Feb 2023;  December 2023; small strokes; loop recorder placed in May  3 stents in heart  PAIN:  Are you having pain? Yes: NPRS scale: 3-4/10 Pain location: right side shoulder and right leg Pain description: sore and aching Aggravating factors: unknown Relieving factors: medication  PRECAUTIONS: Fall  WEIGHT BEARING RESTRICTIONS: No  FALLS:  Has patient fallen in last 6 months? No  LIVING ENVIRONMENT: Lives with: lives with their daughter Lives in: House/apartment Stairs: Yes: External: 6 steps; on right going up and on left going up Has following equipment at home: Dan Humphreys - 2 wheeled, Environmental consultant - 4 wheeled, Wheelchair (manual), shower chair, and bed side commode  OCCUPATION: retired  PLOF: Independent with household mobility with device  PATIENT GOALS: get back to moving and being independent as possible; get back to cooking  NEXT MD VISIT: 8/23  OBJECTIVE:   DIAGNOSTIC FINDINGS:  CLINICAL DATA:  Myelopathy, acute, cervical spine.   EXAM: MRI CERVICAL SPINE WITHOUT CONTRAST   TECHNIQUE: Multiplanar, multisequence MR imaging of the cervical spine was performed. No intravenous contrast was administered.   COMPARISON:  MRI  of the cervical spine 11/26/2021.   FINDINGS: Alignment: 3 mm anterolisthesis at C4-5 scratched at 3 mm anterolisthesis is present at C4-5. No other significant listhesis is present. Straightening of the normal cervical lordosis is again seen.   Vertebrae: Chronic loss of vertebral body heights at C6 and C7 is stable. A remote superior endplate fracture at T3 is new since the prior exam, but not acute.   Cord: Progressive cord compression is present at C2-3. The STIR sequence is distorted by patient motion. Central T2 cord signal changes are present at C2-3 and C3-4.   Posterior Fossa, vertebral arteries, paraspinal tissues: Craniocervical junction is normal. Flow is present in the vertebral arteries bilaterally. Visualized intracranial contents are normal.   Disc levels:   C1-2: Negative.   C2-3: A high-grade central canal stenosis is present. Canal is narrowed to less than 4 mm. Moderate foraminal stenosis is present bilaterally.   C3-4: High-grade stenosis is present. The canal and cord are narrowed to 5 mm. Severe left and moderate right foraminal stenosis is present.   C4-5: A broad-based disc osteophyte complex is present. The canal is narrowed to 8 mm. Moderate foraminal stenosis is worse left than right.  C5-6: A broad-based disc osteophyte complex is present. The central canal is narrowed to 7 mm. No abnormal cord signal is present. Moderate foraminal stenosis is worse on the left.   C6-7: A broad-based disc osteophyte complex is present. The central canal is narrowed to 6 mm. Moderate foraminal stenosis is present bilaterally.   C7-T1: No significant central canal stenosis is present. Moderate right and mild left foraminal narrowing is present.   IMPRESSION: 1. Progressive multilevel spondylosis of the cervical spine as described. 2. Progressive high-grade central canal stenosis at C2-3 and C3-4 with central T2 signal changes suggesting cord edema  or myelomalacia. 3. Moderate foraminal stenosis bilaterally at C2-3. 4. Severe left and moderate right foraminal stenosis at C3-4. 5. Moderate foraminal stenosis bilaterally at C4-5 and C5-6 is worse on the left. 6. Moderate foraminal stenosis bilaterally at C6-7. 7. Moderate right and mild left foraminal narrowing at C7-T1.   PATIENT SURVEYS:  NDI 19/50  COGNITION: Overall cognitive status:  mild memory issues per daughter      SENSATION: Some complaint of N/T right hand  EDEMA:  Noted swelling right foot and ankle   POSTURE: rounded shoulders, forward head, and flexed trunk   PALPATION: Tight and tender right upper trap   CERVICAL ROM:   Active ROM AROM (deg) eval  Flexion 27  Extension 25*  Right lateral flexion 40  Left lateral flexion 32  Right rotation 59  Left rotation 64   (Blank rows = not tested) LOWER EXTREMITY ROM:  Active ROM Right eval Left eval  Hip flexion    Hip extension    Hip abduction    Hip adduction    Hip internal rotation    Hip external rotation    Knee flexion    Knee extension    Ankle dorsiflexion    Ankle plantarflexion    Ankle inversion    Ankle eversion    Shoulder flexion Limited right shoulder flexion ~120    (Blank rows = not tested)  LOWER AND UPPER EXTREMITY MMT:  MMT Right eval Left eval  Hip flexion 4- 4  Hip extension    Hip abduction    Hip adduction    Hip internal rotation    Hip external rotation    Knee flexion    Knee extension 4+ 4+  Ankle dorsiflexion 4+ 4+  Ankle plantarflexion    Ankle inversion    Ankle eversion    Shoulder flexion 3- 4+  Elbow flexion 4 4+  Elbow extension 4 4+  grip 5 5   (Blank rows = not tested)  FUNCTIONAL TESTS:  5 times sit to stand: 27.51 sec minimal use of hands SLS 0 each side  GAIT: Distance walked: 50 ft in clinic Assistive device utilized: Walker - 2 wheeled Level of assistance: SBA Comments: accompanied by daughter   TODAY'S TREATMENT:  DATE:  04/05/23 Standing: Heel raises x 10 Toe raises x 10 Marching x 10  Sit to stand x 5 using hands to push up to stand  Seated: Scapular retraction x 10 2# LAQ's 2 x 10  Ambulation // bars down and back x 3 holding with 1 hand Sidestepping // bars  down and back x 2 with 2 UE assist   04/03/23 Seated  HR 84 irregular  Review of HEP and goals Seated: Upper trap stretch 3 x 20" each Heel/toe raises x 10 LAQ's x 10 each Marching x 10 HR 84 irregular to this point with exercise Shoulder ER with RTB 2 x 10 Shoulder HABD RTB 2 x 10 Trial of right shoulder punch to ceiling; d/c due to pain and audible crepitus Bicep curls 1# x 10 each      03/27/23 physical therapy evaluation and HEP    PATIENT EDUCATION:  Education details: Patient educated on exam findings, POC, scope of PT, HEP, and what to expect next visit. Person educated: Patient Education method: Explanation, Demonstration, and Handouts Education comprehension: verbalized understanding, returned demonstration, verbal cues required, and tactile cues required   HOME EXERCISE PROGRAM: 04/05/23 scapular retraction, walking along counter, sidestepping Access Code: [email or web address] URL: [email or web address] Date: 03/27/2023 Prepared by: [facility] - Rehab  Exercises - Sit to Stand  - 2 x daily - 7 x weekly - 5 sets - 5 reps - Seated Gentle Upper Trapezius Stretch  - 2 x daily - 7 x weekly - 1 sets - 5 reps - 20 sec hold - Standing Single Leg Stance with Counter Support  - 2 x daily - 7 x weekly - 1 sets - 5 reps - 10 sec hold  ASSESSMENT:  CLINICAL IMPRESSION: Today's session progressed lower extremity and core strengthening; progressed to standing exercise.  Continues with right shoulder pain and crepitus.  Added scapular retractions as patient demonstrates rounded shoulders; needs cues for  proper technique.  Needs several short rest breaks during treatment today.  Updated HEP.    Patient will benefit from continued skilled therapy services to address deficits and promote return to optimal function.       Eval:Patient is a 89 y.o. female who was seen today for physical therapy evaluation and treatment for general weakness, impaired balance and neck and shoulder pain. Patient demonstrates decreased strength, balance deficits and gait abnormalities which are negatively impacting patient ability to perform ADLs and functional mobility tasks. Patient will benefit from skilled physical therapy services to address these deficits to improve level of function with ADLs, functional mobility tasks, and reduce risk for falls.    OBJECTIVE IMPAIRMENTS: Abnormal gait, decreased activity tolerance, decreased balance, decreased endurance, decreased knowledge of condition, decreased mobility, difficulty walking, decreased ROM, decreased strength, increased edema, increased fascial restrictions, impaired perceived functional ability, impaired UE functional use, and pain.   ACTIVITY LIMITATIONS: carrying, lifting, bending, sitting, standing, squatting, sleeping, stairs, transfers, bed mobility, toileting, dressing, reach over head, locomotion level, and caring for others  PARTICIPATION LIMITATIONS: meal prep, cleaning, laundry, shopping, community activity, yard work, and church  REHAB POTENTIAL: Good  CLINICAL DECISION MAKING: Evolving/moderate complexity  EVALUATION COMPLEXITY: Moderate   GOALS: Goals reviewed with patient? No  SHORT TERM GOALS: Target date: 04/10/2023 patient will be independent with initial HEP   Baseline: Goal status: INITIAL  2.  Patient will self report 30% improvement to improve tolerance for functional activity  Baseline:  Goal status: INITIAL  LONG TERM GOALS: Target date: 04/24/2023  Patient will  be independent in self management strategies to improve  quality of life and functional outcomes.  Baseline:  Goal status: INITIAL  2.  Patient will self report 50% improvement to improve tolerance for functional activity  Baseline:  Goal status: INITIAL  3.  Patient will be able to stand x 10" on each leg to demonstrate improved functional balanve Baseline:  Goal status: INITIAL  4.  Patient will increase right UE MMT's to 4+ to 5/5 to improve ability to cook a light meal at home Baseline:  Goal status: INITIAL  5.  Patient will improve 5 times sit to stand score from 27.51 sec to 22 sec to demonstrate improved functional mobility and increased lower extremity strength.  Baseline:  Goal status: INITIAL PLAN:  PT FREQUENCY: 2x/week  PT DURATION: 4 weeks  PLANNED INTERVENTIONS: Therapeutic exercises, Therapeutic activity, Neuromuscular re-education, Balance training, Gait training, Patient/Family education, Joint manipulation, Joint mobilization, Stair training, Orthotic/Fit training, DME instructions, Aquatic Therapy, Dry Needling, Electrical stimulation, Spinal manipulation, Spinal mobilization, Cryotherapy, Moist heat, Compression bandaging, scar mobilization, Splintting, Taping, Traction, Ultrasound, Ionotophoresis 4mg /ml Dexamethasone, and Manual therapy   PLAN FOR NEXT SESSION: gait, balance, cervical spine mobility, Right shoulder strength and mobility; postural strengthening, general strengthening; functional strength.     8:15 AM, 04/05/23 Lucah Petta Small Zanita Millman MPT [facility] [place] [phone]

## 2023-04-09 NOTE — Progress Notes (Signed)
Carelink Summary Report / Loop Recorder 

## 2023-04-10 ENCOUNTER — Telehealth: Payer: Self-pay | Admitting: Cardiology

## 2023-04-10 ENCOUNTER — Telehealth (HOSPITAL_COMMUNITY): Payer: Self-pay

## 2023-04-10 ENCOUNTER — Ambulatory Visit (HOSPITAL_COMMUNITY): Payer: Medicare HMO

## 2023-04-10 NOTE — Telephone Encounter (Signed)
Pt c/o medication issue:  1. Name of Medication: apixaban (ELIQUIS) 5 MG TABS tablet   2. How are you currently taking this medication (dosage and times per day)?    3. Are you having a reaction (difficulty breathing--STAT)? no  4. What is your medication issue? Medication is calling patient to be ready shaky, where she can not be stable on her feet. Please advise

## 2023-04-10 NOTE — Telephone Encounter (Signed)
Not a common side effect. I would retry the eliquis, if recurrence let us know and could consider an alternative like xarelto  Dominga Ferry MD

## 2023-04-10 NOTE — Telephone Encounter (Signed)
Pt states that she started taking Eliquis on Monday and this morning she woke up around 1:30 am today feeling "different". She states that she felt uneasy about getting up and moving around. She did not take Eliquis this morning and now she feels better. Pt denies being SOB. Will forward to Dr. Diona Browner and DOD.

## 2023-04-10 NOTE — Telephone Encounter (Signed)
Called daughter's phone; she answered and passed the phone to patient and patient states she started a new medication yesterday; Eliquis.  States it started making her feel poorly last night and this morning.  Reminded patient of her upcoming appointment on Friday and to call us if she is still not feeling well.  Patient verbalizes understanding.    10:44 AM, 04/10/23 [name] Small Jaselle Pryer MPT [facility] [place] [phone]

## 2023-04-11 NOTE — Telephone Encounter (Signed)
Spoke to patient who stated that she does not want to restart Eliquis. Pt stated that she would like to try a new medication. Please advise.

## 2023-04-12 ENCOUNTER — Ambulatory Visit (HOSPITAL_COMMUNITY): Payer: Medicare HMO | Attending: Internal Medicine

## 2023-04-12 ENCOUNTER — Encounter (HOSPITAL_COMMUNITY): Payer: Self-pay

## 2023-04-12 DIAGNOSIS — M542 Cervicalgia: Secondary | ICD-10-CM | POA: Diagnosis not present

## 2023-04-12 DIAGNOSIS — M6281 Muscle weakness (generalized): Secondary | ICD-10-CM | POA: Insufficient documentation

## 2023-04-12 DIAGNOSIS — I639 Cerebral infarction, unspecified: Secondary | ICD-10-CM | POA: Insufficient documentation

## 2023-04-12 DIAGNOSIS — R2689 Other abnormalities of gait and mobility: Secondary | ICD-10-CM | POA: Diagnosis not present

## 2023-04-12 MED ORDER — RIVAROXABAN 15 MG PO TABS: 15.0000 mg | ORAL_TABLET | Freq: Every day | ORAL | 6 refills | Status: DC

## 2023-04-12 NOTE — Telephone Encounter (Signed)
I spoke with daughter and she says [name] will start Xarelto 15 mg daily with dinner. Free 30 day trial card given.  I will clarify if MD wants patient will stop ASA.

## 2023-04-12 NOTE — Therapy (Signed)
OUTPATIENT PHYSICAL THERAPY LOWER EXTREMITY AND CERVICAL TREATMENT   Patient Name: [name] MRN: [id number] DOB:1934/05/13, 89 y.o., female Today's Date: 04/12/2023  END OF SESSION:  PT End of Session - 04/12/23 0813     Visit Number 4    Number of Visits 8    Date for PT Re-Evaluation 04/24/23    Authorization Type [employer] HMO    Progress Note Due on Visit 10    PT Start Time 0731    PT Stop Time 0811    PT Time Calculation (min) 40 min    Equipment Utilized During Treatment Gait belt    Activity Tolerance Patient tolerated treatment well    Behavior During Therapy WFL for tasks assessed/performed              Past Medical History:  Diagnosis Date   Arthritis    CKD (chronic kidney disease), stage III (HCC)    Complete heart block (HCC) 11/16/2016   a. transient during NSTEMI, resolved with revascularization.   Coronary artery disease 11/2009   a. with prior stent placement to the ramus intermedius and RCA. b. NSTEMI 11/2016 s/p DES to DES to distal Cx with moderate residual dz.   CVA (cerebral vascular accident) [facility])    Essential hypertension    Mixed hyperlipidemia    Non-ST elevation (NSTEMI) myocardial infarction (HCC) 11/16/2016   Obesity    Osteoarthritis    Reflux esophagitis    Sleep apnea 09/27/2010   Untreated, REM 64.7/hr AHI 18.5/hr RDI 19.2/hr. Patuent refused CPAP therapy   Past Surgical History:  Procedure Laterality Date   CHOLECYSTECTOMY     CORONARY ANGIOPLASTY WITH STENT PLACEMENT  2007   A 3.0x43mm CYPHER stent post dilated to 3.29 mm the 100% occlusion was reduced to 0%   CORONARY STENT INTERVENTION N/A 11/16/2016   Procedure: Coronary Stent Intervention;  Surgeon: Tonny Bollman, MD;  Location: [facility] INVASIVE CV LAB;  Service: Cardiovascular;  Laterality: N/A;   LEFT HEART CATH AND CORONARY ANGIOGRAPHY N/A 11/16/2016   Procedure: Left Heart Cath and Coronary Angiography;  Surgeon: Tonny Bollman, MD;  Location: [facility] INVASIVE CV LAB;   Service: Cardiovascular;  Laterality: N/A;   LEFT HEART CATHETERIZATION WITH CORONARY ANGIOGRAM N/A 07/12/2014   Procedure: LEFT HEART CATHETERIZATION WITH CORONARY ANGIOGRAM;  Surgeon: Micheline Chapman, MD;  Location: [facility] CATH LAB;  Service: Cardiovascular;  Laterality: N/A;   TEMPORARY PACEMAKER N/A 11/16/2016   Procedure: Temporary Pacemaker;  Surgeon: Tonny Bollman, MD;  Location: [facility] INVASIVE CV LAB;  Service: Cardiovascular;  Laterality: N/A;   Patient Active Problem List   Diagnosis Date Noted   Paroxysmal atrial fibrillation (HCC) 04/04/2023   Hypercoagulable state due to paroxysmal atrial fibrillation (HCC) 04/04/2023   Numbness and tingling of both feet 03/23/2023   Hypoalbuminemia due to protein-calorie malnutrition (HCC) 03/23/2023   Bilateral primary osteoarthritis of knee 11/01/2022   Encounter for general adult medical examination with abnormal findings 11/01/2022   Chronic kidney disease, stage 3b (HCC) 08/21/2022   Left pontine stroke (HCC) 11/06/2021   Dyslipidemia 11/06/2021   GERD without esophagitis 11/06/2021   CKD (chronic kidney disease), stage III (HCC) 11/23/2016   Pericardial effusion 11/23/2016   Complete heart block (HCC) 11/16/2016   Non-ST elevation (NSTEMI) myocardial infarction (HCC) 11/16/2016   Renal insufficiency 11/03/2015   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 09/06/2015   Uncontrolled hypertension 08/31/2015   HTN (hypertension), malignant    Symptomatic bradycardia    Mixed hyperlipidemia  Epigastric fullness    Near syncope 08/28/2015   CAD S/P PCI    Essential hypertension 03/05/2013   Obesity (BMI 30-39.9) 03/05/2013   Hyperlipidemia with target LDL less than 70 03/05/2013   Obstructive sleep apnea 03/05/2013    PCP: Delmar Landau, MD  REFERRING PROVIDER: Rolly Salter, MD  REFERRING DIAG: Outpt referral for PT/OT eval and treat- pt with decreased mobility due to generalized weakness and decreased  balance  THERAPY DIAG:  Neck pain  Muscle weakness (generalized)  Other abnormalities of gait and mobility  Cerebrovascular accident (CVA), unspecified mechanism (HCC)  Rationale for Evaluation and Treatment: Rehabilitation  ONSET DATE: 02/23/23  SUBJECTIVE:   SUBJECTIVE STATEMENT: Pt reports she didn't feel good with new medication (Eliquis), reason she missed last apt.  Going to discuss alternative medication with MD.    Eval:Went to hospital Saturday due to numbness in hands; ruled out stroke; d/c 6/15 from hospital; recommended therapy for strength and balance.  Also she was having right side arm pain that they thought is coming from her neck; had a fall last year and hurt her right leg and broke some toes; also has some arthritis in right shoulder; reports generally some pain right side shoulder and lower extremity  PERTINENT HISTORY: Feb 2023;  December 2023; small strokes; loop recorder placed in May  3 stents in heart  PAIN:  Are you having pain? Yes: NPRS scale: 3-4/10 Pain location: right side shoulder and right leg Pain description: sore and aching Aggravating factors: unknown Relieving factors: medication  PRECAUTIONS: Fall  WEIGHT BEARING RESTRICTIONS: No  FALLS:  Has patient fallen in last 6 months? No  LIVING ENVIRONMENT: Lives with: lives with their daughter Lives in: House/apartment Stairs: Yes: External: 6 steps; on right going up and on left going up Has following equipment at home: Dan Humphreys - 2 wheeled, Environmental consultant - 4 wheeled, Wheelchair (manual), shower chair, and bed side commode  OCCUPATION: retired  PLOF: Independent with household mobility with device  PATIENT GOALS: get back to moving and being independent as possible; get back to cooking  NEXT MD VISIT: 8/23  OBJECTIVE:   DIAGNOSTIC FINDINGS:  CLINICAL DATA:  Myelopathy, acute, cervical spine.   EXAM: MRI CERVICAL SPINE WITHOUT CONTRAST   TECHNIQUE: Multiplanar, multisequence MR  imaging of the cervical spine was performed. No intravenous contrast was administered.   COMPARISON:  MRI of the cervical spine 11/26/2021.   FINDINGS: Alignment: 3 mm anterolisthesis at C4-5 scratched at 3 mm anterolisthesis is present at C4-5. No other significant listhesis is present. Straightening of the normal cervical lordosis is again seen.   Vertebrae: Chronic loss of vertebral body heights at C6 and C7 is stable. A remote superior endplate fracture at T3 is new since the prior exam, but not acute.   Cord: Progressive cord compression is present at C2-3. The STIR sequence is distorted by patient motion. Central T2 cord signal changes are present at C2-3 and C3-4.   Posterior Fossa, vertebral arteries, paraspinal tissues: Craniocervical junction is normal. Flow is present in the vertebral arteries bilaterally. Visualized intracranial contents are normal.   Disc levels:   C1-2: Negative.   C2-3: A high-grade central canal stenosis is present. Canal is narrowed to less than 4 mm. Moderate foraminal stenosis is present bilaterally.   C3-4: High-grade stenosis is present. The canal and cord are narrowed to 5 mm. Severe left and moderate right foraminal stenosis is present.   C4-5: A broad-based disc osteophyte complex is present. The canal  is narrowed to 8 mm. Moderate foraminal stenosis is worse left than right.   C5-6: A broad-based disc osteophyte complex is present. The central canal is narrowed to 7 mm. No abnormal cord signal is present. Moderate foraminal stenosis is worse on the left.   C6-7: A broad-based disc osteophyte complex is present. The central canal is narrowed to 6 mm. Moderate foraminal stenosis is present bilaterally.   C7-T1: No significant central canal stenosis is present. Moderate right and mild left foraminal narrowing is present.   IMPRESSION: 1. Progressive multilevel spondylosis of the cervical spine as described. 2. Progressive  high-grade central canal stenosis at C2-3 and C3-4 with central T2 signal changes suggesting cord edema or myelomalacia. 3. Moderate foraminal stenosis bilaterally at C2-3. 4. Severe left and moderate right foraminal stenosis at C3-4. 5. Moderate foraminal stenosis bilaterally at C4-5 and C5-6 is worse on the left. 6. Moderate foraminal stenosis bilaterally at C6-7. 7. Moderate right and mild left foraminal narrowing at C7-T1.   PATIENT SURVEYS:  NDI 19/50  COGNITION: Overall cognitive status:  mild memory issues per daughter      SENSATION: Some complaint of N/T right hand  EDEMA:  Noted swelling right foot and ankle   POSTURE: rounded shoulders, forward head, and flexed trunk   PALPATION: Tight and tender right upper trap   CERVICAL ROM:   Active ROM AROM (deg) eval  Flexion 27  Extension 25*  Right lateral flexion 40  Left lateral flexion 32  Right rotation 59  Left rotation 64   (Blank rows = not tested) LOWER EXTREMITY ROM:  Active ROM Right eval Left eval  Hip flexion    Hip extension    Hip abduction    Hip adduction    Hip internal rotation    Hip external rotation    Knee flexion    Knee extension    Ankle dorsiflexion    Ankle plantarflexion    Ankle inversion    Ankle eversion    Shoulder flexion Limited right shoulder flexion ~120    (Blank rows = not tested)  LOWER AND UPPER EXTREMITY MMT:  MMT Right eval Left eval  Hip flexion 4- 4  Hip extension    Hip abduction    Hip adduction    Hip internal rotation    Hip external rotation    Knee flexion    Knee extension 4+ 4+  Ankle dorsiflexion 4+ 4+  Ankle plantarflexion    Ankle inversion    Ankle eversion    Shoulder flexion 3- 4+  Elbow flexion 4 4+  Elbow extension 4 4+  grip 5 5   (Blank rows = not tested)  FUNCTIONAL TESTS:  5 times sit to stand: 27.51 sec minimal use of hands SLS 0 each side  GAIT: Distance walked: 50 ft in clinic Assistive device utilized:  Walker - 2 wheeled Level of assistance: SBA Comments: accompanied by daughter   TODAY'S TREATMENT:  DATE:  04/12/23: 10 STS no HHA eccentric control Standing: Heel raises x 10 Toe raises x 10 Marching x 10  Sidestepping // bars  down and back x 2 with 2 UE assist  Seated: Yellow theraband rows 2x 10 2# LAQ 2x 10 Shoulder rolls   04/05/23 Standing: Heel raises x 10 Toe raises x 10 Marching x 10  Sit to stand x 5 using hands to push up to stand  Seated: Scapular retraction x 10 2# LAQ's 2 x 10  Ambulation // bars down and back x 3 holding with 1 hand Sidestepping // bars  down and back x 2 with 2 UE assist   04/03/23 Seated  HR 84 irregular  Review of HEP and goals Seated: Upper trap stretch 3 x 20" each Heel/toe raises x 10 LAQ's x 10 each Marching x 10 HR 84 irregular to this point with exercise Shoulder ER with RTB 2 x 10 Shoulder HABD RTB 2 x 10 Trial of right shoulder punch to ceiling; d/c due to pain and audible crepitus Bicep curls 1# x 10 each      03/27/23 physical therapy evaluation and HEP    PATIENT EDUCATION:  Education details: Patient educated on exam findings, POC, scope of PT, HEP, and what to expect next visit. Person educated: Patient Education method: Explanation, Demonstration, and Handouts Education comprehension: verbalized understanding, returned demonstration, verbal cues required, and tactile cues required   HOME EXERCISE PROGRAM: 04/12/23: Walking program with walker 04/05/23 scapular retraction, walking along counter, sidestepping Access Code: [email or web address] URL: [email or web address] Date: 03/27/2023 Prepared by: [facility] - Rehab  Exercises - Sit to Stand  - 2 x daily - 7 x weekly - 5 sets - 5 reps - Seated Gentle Upper Trapezius Stretch  - 2 x daily - 7 x weekly - 1 sets - 5 reps - 20 sec hold -  Standing Single Leg Stance with Counter Support  - 2 x daily - 7 x weekly - 1 sets - 5 reps - 10 sec hold  ASSESSMENT:  CLINICAL IMPRESSION: Session focus with core and proximal strengthening as well as postural education to assist with balance.  Pt presents with forward head and rounded shoulders, educated benefits with proper seated posture for Rt shoulder pain control.  Pt limited by fatigue through session, required several short duration seated rest breaks through session.  Pt educated on benefits with beginning walking program to improve activity tolerance in safe location with RW, pt and daughter verbalized understanding and given handout to add to HEP.   Eval:Patient is a 89 y.o. female who was seen today for physical therapy evaluation and treatment for general weakness, impaired balance and neck and shoulder pain. Patient demonstrates decreased strength, balance deficits and gait abnormalities which are negatively impacting patient ability to perform ADLs and functional mobility tasks. Patient will benefit from skilled physical therapy services to address these deficits to improve level of function with ADLs, functional mobility tasks, and reduce risk for falls.    OBJECTIVE IMPAIRMENTS: Abnormal gait, decreased activity tolerance, decreased balance, decreased endurance, decreased knowledge of condition, decreased mobility, difficulty walking, decreased ROM, decreased strength, increased edema, increased fascial restrictions, impaired perceived functional ability, impaired UE functional use, and pain.   ACTIVITY LIMITATIONS: carrying, lifting, bending, sitting, standing, squatting, sleeping, stairs, transfers, bed mobility, toileting, dressing, reach over head, locomotion level, and caring for others  PARTICIPATION LIMITATIONS: meal prep, cleaning, laundry, shopping, community activity, yard work, and church  REHAB POTENTIAL: Good  CLINICAL DECISION MAKING:  Evolving/moderate  complexity  EVALUATION COMPLEXITY: Moderate   GOALS: Goals reviewed with patient? No  SHORT TERM GOALS: Target date: 04/10/2023 patient will be independent with initial HEP   Baseline: Goal status: IN PROGRESS  2.  Patient will self report 30% improvement to improve tolerance for functional activity  Baseline:  Goal status: IN PROGRESS  LONG TERM GOALS: Target date: 04/24/2023  Patient will be independent in self management strategies to improve quality of life and functional outcomes.  Baseline:  Goal status: IN PROGRESS  2.  Patient will self report 50% improvement to improve tolerance for functional activity  Baseline:  Goal status: IN PROGRESS  3.  Patient will be able to stand x 10" on each leg to demonstrate improved functional balanve Baseline:  Goal status: IN PROGRESS  4.  Patient will increase right UE MMT's to 4+ to 5/5 to improve ability to cook a light meal at home Baseline:  Goal status: IN PROGRESS  5.  Patient will improve 5 times sit to stand score from 27.51 sec to 22 sec to demonstrate improved functional mobility and increased lower extremity strength.  Baseline:  Goal status: IN PROGRESS PLAN:  PT FREQUENCY: 2x/week  PT DURATION: 4 weeks  PLANNED INTERVENTIONS: Therapeutic exercises, Therapeutic activity, Neuromuscular re-education, Balance training, Gait training, Patient/Family education, Joint manipulation, Joint mobilization, Stair training, Orthotic/Fit training, DME instructions, Aquatic Therapy, Dry Needling, Electrical stimulation, Spinal manipulation, Spinal mobilization, Cryotherapy, Moist heat, Compression bandaging, scar mobilization, Splintting, Taping, Traction, Ultrasound, Ionotophoresis 4mg /ml Dexamethasone, and Manual therapy   PLAN FOR NEXT SESSION: gait, balance, cervical spine mobility, Right shoulder strength and mobility; postural strengthening, general strengthening; functional strength.    Becky Sax, LPTA/CLT;  CBIS [phone]  Juel Burrow, PTA 04/12/2023, 9:26 AM  9:26 AM, 04/12/23

## 2023-04-15 NOTE — Telephone Encounter (Signed)
Attempted to contact patient- mailbox full  

## 2023-04-15 NOTE — Telephone Encounter (Signed)
Jonelle Sidle, MD: When Eliquis was initially discussed, plan was to take her off Plavix and leave her on aspirin 81 mg daily.  So at this point I would try to continue the aspirin as long as she tolerates the combination.

## 2023-04-17 ENCOUNTER — Ambulatory Visit (HOSPITAL_COMMUNITY): Payer: Medicare HMO

## 2023-04-17 DIAGNOSIS — R2689 Other abnormalities of gait and mobility: Secondary | ICD-10-CM

## 2023-04-17 DIAGNOSIS — M6281 Muscle weakness (generalized): Secondary | ICD-10-CM | POA: Diagnosis not present

## 2023-04-17 DIAGNOSIS — I639 Cerebral infarction, unspecified: Secondary | ICD-10-CM

## 2023-04-17 DIAGNOSIS — M542 Cervicalgia: Secondary | ICD-10-CM

## 2023-04-17 DIAGNOSIS — N1832 Chronic kidney disease, stage 3b: Secondary | ICD-10-CM | POA: Diagnosis not present

## 2023-04-17 DIAGNOSIS — I129 Hypertensive chronic kidney disease with stage 1 through stage 4 chronic kidney disease, or unspecified chronic kidney disease: Secondary | ICD-10-CM | POA: Diagnosis not present

## 2023-04-17 DIAGNOSIS — N2581 Secondary hyperparathyroidism of renal origin: Secondary | ICD-10-CM | POA: Diagnosis not present

## 2023-04-17 DIAGNOSIS — R809 Proteinuria, unspecified: Secondary | ICD-10-CM | POA: Diagnosis not present

## 2023-04-17 NOTE — Therapy (Signed)
OUTPATIENT PHYSICAL THERAPY LOWER EXTREMITY AND CERVICAL TREATMENT   Patient Name: [name] MRN: [id number] DOB:May 24, 1934, 89 y.o., female Today's Date: 04/17/2023  END OF SESSION:  PT End of Session - 04/17/23 0742     Visit Number 5    Number of Visits 8    Date for PT Re-Evaluation 04/24/23    Authorization Type [employer] HMO    Progress Note Due on Visit 10    PT Start Time [phone]   late check in   PT Stop Time 0815    PT Time Calculation (min) 33 min    Equipment Utilized During Treatment Gait belt    Activity Tolerance Patient tolerated treatment well    Behavior During Therapy WFL for tasks assessed/performed              Past Medical History:  Diagnosis Date   Arthritis    CKD (chronic kidney disease), stage III (HCC)    Complete heart block (HCC) 11/16/2016   a. transient during NSTEMI, resolved with revascularization.   Coronary artery disease 11/2009   a. with prior stent placement to the ramus intermedius and RCA. b. NSTEMI 11/2016 s/p DES to DES to distal Cx with moderate residual dz.   CVA (cerebral vascular accident) [facility])    Essential hypertension    Mixed hyperlipidemia    Non-ST elevation (NSTEMI) myocardial infarction (HCC) 11/16/2016   Obesity    Osteoarthritis    Reflux esophagitis    Sleep apnea 09/27/2010   Untreated, REM 64.7/hr AHI 18.5/hr RDI 19.2/hr. Patuent refused CPAP therapy   Past Surgical History:  Procedure Laterality Date   CHOLECYSTECTOMY     CORONARY ANGIOPLASTY WITH STENT PLACEMENT  2007   A 3.0x43mm CYPHER stent post dilated to 3.29 mm the 100% occlusion was reduced to 0%   CORONARY STENT INTERVENTION N/A 11/16/2016   Procedure: Coronary Stent Intervention;  Surgeon: Tonny Bollman, MD;  Location: [facility] INVASIVE CV LAB;  Service: Cardiovascular;  Laterality: N/A;   LEFT HEART CATH AND CORONARY ANGIOGRAPHY N/A 11/16/2016   Procedure: Left Heart Cath and Coronary Angiography;  Surgeon: Tonny Bollman, MD;  Location: [facility]  INVASIVE CV LAB;  Service: Cardiovascular;  Laterality: N/A;   LEFT HEART CATHETERIZATION WITH CORONARY ANGIOGRAM N/A 07/12/2014   Procedure: LEFT HEART CATHETERIZATION WITH CORONARY ANGIOGRAM;  Surgeon: Micheline Chapman, MD;  Location: [facility] CATH LAB;  Service: Cardiovascular;  Laterality: N/A;   TEMPORARY PACEMAKER N/A 11/16/2016   Procedure: Temporary Pacemaker;  Surgeon: Tonny Bollman, MD;  Location: [facility] INVASIVE CV LAB;  Service: Cardiovascular;  Laterality: N/A;   Patient Active Problem List   Diagnosis Date Noted   Paroxysmal atrial fibrillation (HCC) 04/04/2023   Hypercoagulable state due to paroxysmal atrial fibrillation (HCC) 04/04/2023   Numbness and tingling of both feet 03/23/2023   Hypoalbuminemia due to protein-calorie malnutrition (HCC) 03/23/2023   Bilateral primary osteoarthritis of knee 11/01/2022   Encounter for general adult medical examination with abnormal findings 11/01/2022   Chronic kidney disease, stage 3b (HCC) 08/21/2022   Left pontine stroke (HCC) 11/06/2021   Dyslipidemia 11/06/2021   GERD without esophagitis 11/06/2021   CKD (chronic kidney disease), stage III (HCC) 11/23/2016   Pericardial effusion 11/23/2016   Complete heart block (HCC) 11/16/2016   Non-ST elevation (NSTEMI) myocardial infarction (HCC) 11/16/2016   Renal insufficiency 11/03/2015   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 09/06/2015   Uncontrolled hypertension 08/31/2015   HTN (hypertension), malignant    Symptomatic bradycardia  Mixed hyperlipidemia    Epigastric fullness    Near syncope 08/28/2015   CAD S/P PCI    Essential hypertension 03/05/2013   Obesity (BMI 30-39.9) 03/05/2013   Hyperlipidemia with target LDL less than 70 03/05/2013   Obstructive sleep apnea 03/05/2013    PCP: Delmar Landau, MD  REFERRING PROVIDER: Rolly Salter, MD  REFERRING DIAG: Outpt referral for PT/OT eval and treat- pt with decreased mobility due to generalized weakness  and decreased balance  THERAPY DIAG:  Muscle weakness (generalized)  Neck pain  Other abnormalities of gait and mobility  Cerebrovascular accident (CVA), unspecified mechanism (HCC)  Rationale for Evaluation and Treatment: Rehabilitation  ONSET DATE: 02/23/23  SUBJECTIVE:   SUBJECTIVE STATEMENT: Patient reports she has been doing her exercises; legs and knees are sore from exercise.  Her right shoulder is still bothering her.  She is no longer taking Eliquis; taking new med; Xarelto and that seems to be working better for her.     Eval:Went to hospital Saturday due to numbness in hands; ruled out stroke; d/c 6/15 from hospital; recommended therapy for strength and balance.  Also she was having right side arm pain that they thought is coming from her neck; had a fall last year and hurt her right leg and broke some toes; also has some arthritis in right shoulder; reports generally some pain right side shoulder and lower extremity  PERTINENT HISTORY: Feb 2023;  December 2023; small strokes; loop recorder placed in May  3 stents in heart  PAIN:  Are you having pain? Yes: NPRS scale: 3-4/10 Pain location: right side shoulder and right leg Pain description: sore and aching Aggravating factors: unknown Relieving factors: medication  PRECAUTIONS: Fall  WEIGHT BEARING RESTRICTIONS: No  FALLS:  Has patient fallen in last 6 months? No  LIVING ENVIRONMENT: Lives with: lives with their daughter Lives in: House/apartment Stairs: Yes: External: 6 steps; on right going up and on left going up Has following equipment at home: Dan Humphreys - 2 wheeled, Environmental consultant - 4 wheeled, Wheelchair (manual), shower chair, and bed side commode  OCCUPATION: retired  PLOF: Independent with household mobility with device  PATIENT GOALS: get back to moving and being independent as possible; get back to cooking  NEXT MD VISIT: 8/23  OBJECTIVE:   DIAGNOSTIC FINDINGS:  CLINICAL DATA:  Myelopathy, acute,  cervical spine.   EXAM: MRI CERVICAL SPINE WITHOUT CONTRAST   TECHNIQUE: Multiplanar, multisequence MR imaging of the cervical spine was performed. No intravenous contrast was administered.   COMPARISON:  MRI of the cervical spine 11/26/2021.   FINDINGS: Alignment: 3 mm anterolisthesis at C4-5 scratched at 3 mm anterolisthesis is present at C4-5. No other significant listhesis is present. Straightening of the normal cervical lordosis is again seen.   Vertebrae: Chronic loss of vertebral body heights at C6 and C7 is stable. A remote superior endplate fracture at T3 is new since the prior exam, but not acute.   Cord: Progressive cord compression is present at C2-3. The STIR sequence is distorted by patient motion. Central T2 cord signal changes are present at C2-3 and C3-4.   Posterior Fossa, vertebral arteries, paraspinal tissues: Craniocervical junction is normal. Flow is present in the vertebral arteries bilaterally. Visualized intracranial contents are normal.   Disc levels:   C1-2: Negative.   C2-3: A high-grade central canal stenosis is present. Canal is narrowed to less than 4 mm. Moderate foraminal stenosis is present bilaterally.   C3-4: High-grade stenosis is present. The canal and cord  are narrowed to 5 mm. Severe left and moderate right foraminal stenosis is present.   C4-5: A broad-based disc osteophyte complex is present. The canal is narrowed to 8 mm. Moderate foraminal stenosis is worse left than right.   C5-6: A broad-based disc osteophyte complex is present. The central canal is narrowed to 7 mm. No abnormal cord signal is present. Moderate foraminal stenosis is worse on the left.   C6-7: A broad-based disc osteophyte complex is present. The central canal is narrowed to 6 mm. Moderate foraminal stenosis is present bilaterally.   C7-T1: No significant central canal stenosis is present. Moderate right and mild left foraminal narrowing is present.    IMPRESSION: 1. Progressive multilevel spondylosis of the cervical spine as described. 2. Progressive high-grade central canal stenosis at C2-3 and C3-4 with central T2 signal changes suggesting cord edema or myelomalacia. 3. Moderate foraminal stenosis bilaterally at C2-3. 4. Severe left and moderate right foraminal stenosis at C3-4. 5. Moderate foraminal stenosis bilaterally at C4-5 and C5-6 is worse on the left. 6. Moderate foraminal stenosis bilaterally at C6-7. 7. Moderate right and mild left foraminal narrowing at C7-T1.   PATIENT SURVEYS:  NDI 19/50  COGNITION: Overall cognitive status:  mild memory issues per daughter      SENSATION: Some complaint of N/T right hand  EDEMA:  Noted swelling right foot and ankle   POSTURE: rounded shoulders, forward head, and flexed trunk   PALPATION: Tight and tender right upper trap   CERVICAL ROM:   Active ROM AROM (deg) eval  Flexion 27  Extension 25*  Right lateral flexion 40  Left lateral flexion 32  Right rotation 59  Left rotation 64   (Blank rows = not tested) LOWER EXTREMITY ROM:  Active ROM Right eval Left eval  Hip flexion    Hip extension    Hip abduction    Hip adduction    Hip internal rotation    Hip external rotation    Knee flexion    Knee extension    Ankle dorsiflexion    Ankle plantarflexion    Ankle inversion    Ankle eversion    Shoulder flexion Limited right shoulder flexion ~120    (Blank rows = not tested)  LOWER AND UPPER EXTREMITY MMT:  MMT Right eval Left eval  Hip flexion 4- 4  Hip extension    Hip abduction    Hip adduction    Hip internal rotation    Hip external rotation    Knee flexion    Knee extension 4+ 4+  Ankle dorsiflexion 4+ 4+  Ankle plantarflexion    Ankle inversion    Ankle eversion    Shoulder flexion 3- 4+  Elbow flexion 4 4+  Elbow extension 4 4+  grip 5 5   (Blank rows = not tested)  FUNCTIONAL TESTS:  5 times sit to stand: 27.51 sec minimal  use of hands SLS 0 each side  GAIT: Distance walked: 50 ft in clinic Assistive device utilized: Walker - 2 wheeled Level of assistance: SBA Comments: accompanied by daughter   TODAY'S TREATMENT:  DATE:  04/17/23 Sit to stand 2 x 5 Standing: Hip abduction 2 x 5 Heel/toe raises 2 x 10 4" step ups x 5 each Small bos x 30" no UE assist Tandem stance x 20" each way no UE assist Sitting: RTB scapular retraction 2 x 10  04/12/23: 10 STS no HHA eccentric control Standing: Heel raises x 10 Toe raises x 10 Marching x 10  Sidestepping // bars  down and back x 2 with 2 UE assist  Seated: Yellow theraband rows 2x 10 2# LAQ 2x 10 Shoulder rolls   04/05/23 Standing: Heel raises x 10 Toe raises x 10 Marching x 10  Sit to stand x 5 using hands to push up to stand  Seated: Scapular retraction x 10 2# LAQ's 2 x 10  Ambulation // bars down and back x 3 holding with 1 hand Sidestepping // bars  down and back x 2 with 2 UE assist   04/03/23 Seated  HR 84 irregular  Review of HEP and goals Seated: Upper trap stretch 3 x 20" each Heel/toe raises x 10 LAQ's x 10 each Marching x 10 HR 84 irregular to this point with exercise Shoulder ER with RTB 2 x 10 Shoulder HABD RTB 2 x 10 Trial of right shoulder punch to ceiling; d/c due to pain and audible crepitus Bicep curls 1# x 10 each      03/27/23 physical therapy evaluation and HEP    PATIENT EDUCATION:  Education details: Patient educated on exam findings, POC, scope of PT, HEP, and what to expect next visit. Person educated: Patient Education method: Explanation, Demonstration, and Handouts Education comprehension: verbalized understanding, returned demonstration, verbal cues required, and tactile cues required   HOME EXERCISE PROGRAM: 04/17/23 RTB scapular retractions 04/12/23: Walking program with  walker 04/05/23 scapular retraction, walking along counter, sidestepping Access Code: [email or web address] URL: [email or web address] Date: 03/27/2023 Prepared by: [facility] - Rehab  Exercises - Sit to Stand  - 2 x daily - 7 x weekly - 5 sets - 5 reps - Seated Gentle Upper Trapezius Stretch  - 2 x daily - 7 x weekly - 1 sets - 5 reps - 20 sec hold - Standing Single Leg Stance with Counter Support  - 2 x daily - 7 x weekly - 1 sets - 5 reps - 10 sec hold  ASSESSMENT:  CLINICAL IMPRESSION: Late check in today; added step ups and hip abduction today to continue to challenge lower extremity strength.  Added scapular retractions with RTB to program today and updated HEP to include. Added balance activities today; patient needs CGA and occassional use of hands to maintain balance with tandem stance.    Patient will benefit from continued skilled therapy services to address deficits and promote return to optimal function.       Eval:Patient is a 89 y.o. female who was seen today for physical therapy evaluation and treatment for general weakness, impaired balance and neck and shoulder pain. Patient demonstrates decreased strength, balance deficits and gait abnormalities which are negatively impacting patient ability to perform ADLs and functional mobility tasks. Patient will benefit from skilled physical therapy services to address these deficits to improve level of function with ADLs, functional mobility tasks, and reduce risk for falls.    OBJECTIVE IMPAIRMENTS: Abnormal gait, decreased activity tolerance, decreased balance, decreased endurance, decreased knowledge of condition, decreased mobility, difficulty walking, decreased ROM, decreased strength, increased edema, increased fascial restrictions, impaired perceived functional ability, impaired UE functional use, and pain.   ACTIVITY LIMITATIONS:  carrying, lifting, bending, sitting, standing, squatting, sleeping, stairs, transfers, bed mobility,  toileting, dressing, reach over head, locomotion level, and caring for others  PARTICIPATION LIMITATIONS: meal prep, cleaning, laundry, shopping, community activity, yard work, and church  REHAB POTENTIAL: Good  CLINICAL DECISION MAKING: Evolving/moderate complexity  EVALUATION COMPLEXITY: Moderate   GOALS: Goals reviewed with patient? No  SHORT TERM GOALS: Target date: 04/10/2023 patient will be independent with initial HEP   Baseline: Goal status: IN PROGRESS  2.  Patient will self report 30% improvement to improve tolerance for functional activity  Baseline:  Goal status: IN PROGRESS  LONG TERM GOALS: Target date: 04/24/2023  Patient will be independent in self management strategies to improve quality of life and functional outcomes.  Baseline:  Goal status: IN PROGRESS  2.  Patient will self report 50% improvement to improve tolerance for functional activity  Baseline:  Goal status: IN PROGRESS  3.  Patient will be able to stand x 10" on each leg to demonstrate improved functional balanve Baseline:  Goal status: IN PROGRESS  4.  Patient will increase right UE MMT's to 4+ to 5/5 to improve ability to cook a light meal at home Baseline:  Goal status: IN PROGRESS  5.  Patient will improve 5 times sit to stand score from 27.51 sec to 22 sec to demonstrate improved functional mobility and increased lower extremity strength.  Baseline:  Goal status: IN PROGRESS PLAN:  PT FREQUENCY: 2x/week  PT DURATION: 4 weeks  PLANNED INTERVENTIONS: Therapeutic exercises, Therapeutic activity, Neuromuscular re-education, Balance training, Gait training, Patient/Family education, Joint manipulation, Joint mobilization, Stair training, Orthotic/Fit training, DME instructions, Aquatic Therapy, Dry Needling, Electrical stimulation, Spinal manipulation, Spinal mobilization, Cryotherapy, Moist heat, Compression bandaging, scar mobilization, Splintting, Taping, Traction, Ultrasound,  Ionotophoresis 4mg /ml Dexamethasone, and Manual therapy   PLAN FOR NEXT SESSION: gait, balance, cervical spine mobility, Right shoulder strength and mobility; postural strengthening, general strengthening; functional strength.    8:16 AM, 04/17/23  Small  MPT [facility] [place] [phone]

## 2023-04-18 NOTE — Telephone Encounter (Signed)
Patient notified to continue asa at this time. Pt had no further questions or concerns at this time.

## 2023-04-19 ENCOUNTER — Encounter (HOSPITAL_COMMUNITY): Payer: Medicare HMO

## 2023-04-22 DIAGNOSIS — N1832 Chronic kidney disease, stage 3b: Secondary | ICD-10-CM | POA: Diagnosis not present

## 2023-04-22 DIAGNOSIS — N2581 Secondary hyperparathyroidism of renal origin: Secondary | ICD-10-CM | POA: Diagnosis not present

## 2023-04-22 DIAGNOSIS — R809 Proteinuria, unspecified: Secondary | ICD-10-CM | POA: Diagnosis not present

## 2023-04-22 DIAGNOSIS — I129 Hypertensive chronic kidney disease with stage 1 through stage 4 chronic kidney disease, or unspecified chronic kidney disease: Secondary | ICD-10-CM | POA: Diagnosis not present

## 2023-04-23 ENCOUNTER — Emergency Department (HOSPITAL_COMMUNITY): Payer: Medicare HMO

## 2023-04-23 ENCOUNTER — Other Ambulatory Visit: Payer: Self-pay

## 2023-04-23 ENCOUNTER — Emergency Department (HOSPITAL_COMMUNITY)
Admission: EM | Admit: 2023-04-23 | Discharge: 2023-04-23 | Disposition: A | Discharge status: Home / Self Care | Payer: Medicare HMO | Attending: Emergency Medicine | Admitting: Emergency Medicine

## 2023-04-23 ENCOUNTER — Encounter (HOSPITAL_COMMUNITY): Payer: Self-pay

## 2023-04-23 DIAGNOSIS — M4802 Spinal stenosis, cervical region: Secondary | ICD-10-CM | POA: Insufficient documentation

## 2023-04-23 DIAGNOSIS — K449 Diaphragmatic hernia without obstruction or gangrene: Secondary | ICD-10-CM | POA: Diagnosis not present

## 2023-04-23 DIAGNOSIS — M25511 Pain in right shoulder: Secondary | ICD-10-CM | POA: Insufficient documentation

## 2023-04-23 DIAGNOSIS — M25519 Pain in unspecified shoulder: Secondary | ICD-10-CM

## 2023-04-23 DIAGNOSIS — Z950 Presence of cardiac pacemaker: Secondary | ICD-10-CM | POA: Diagnosis not present

## 2023-04-23 DIAGNOSIS — M542 Cervicalgia: Secondary | ICD-10-CM | POA: Diagnosis not present

## 2023-04-23 DIAGNOSIS — N183 Chronic kidney disease, stage 3 unspecified: Secondary | ICD-10-CM | POA: Diagnosis not present

## 2023-04-23 DIAGNOSIS — I2510 Atherosclerotic heart disease of native coronary artery without angina pectoris: Secondary | ICD-10-CM | POA: Diagnosis not present

## 2023-04-23 DIAGNOSIS — Z7901 Long term (current) use of anticoagulants: Secondary | ICD-10-CM | POA: Diagnosis not present

## 2023-04-23 DIAGNOSIS — R600 Localized edema: Secondary | ICD-10-CM | POA: Insufficient documentation

## 2023-04-23 DIAGNOSIS — I10 Essential (primary) hypertension: Secondary | ICD-10-CM | POA: Diagnosis not present

## 2023-04-23 DIAGNOSIS — N3090 Cystitis, unspecified without hematuria: Secondary | ICD-10-CM | POA: Diagnosis not present

## 2023-04-23 DIAGNOSIS — Z7982 Long term (current) use of aspirin: Secondary | ICD-10-CM | POA: Diagnosis not present

## 2023-04-23 DIAGNOSIS — Z79899 Other long term (current) drug therapy: Secondary | ICD-10-CM | POA: Diagnosis not present

## 2023-04-23 DIAGNOSIS — I129 Hypertensive chronic kidney disease with stage 1 through stage 4 chronic kidney disease, or unspecified chronic kidney disease: Secondary | ICD-10-CM | POA: Insufficient documentation

## 2023-04-23 LAB — URINALYSIS, ROUTINE W REFLEX MICROSCOPIC
Bilirubin Urine: NEGATIVE
Glucose, UA: NEGATIVE mg/dL
Hgb urine dipstick: NEGATIVE
Ketones, ur: NEGATIVE mg/dL
Nitrite: NEGATIVE
Protein, ur: 30 mg/dL — AB
Specific Gravity, Urine: 1.013 (ref 1.005–1.030)
pH: 8 (ref 5.0–8.0)

## 2023-04-23 LAB — HEPATIC FUNCTION PANEL
ALT: 17 U/L (ref 0–44)
AST: 21 U/L (ref 15–41)
Albumin: 2.5 g/dL — ABNORMAL LOW (ref 3.5–5.0)
Alkaline Phosphatase: 56 U/L (ref 38–126)
Bilirubin, Direct: 0.3 mg/dL — ABNORMAL HIGH (ref 0.0–0.2)
Indirect Bilirubin: 0.4 mg/dL (ref 0.3–0.9)
Total Bilirubin: 0.7 mg/dL (ref 0.3–1.2)
Total Protein: 5.3 g/dL — ABNORMAL LOW (ref 6.5–8.1)

## 2023-04-23 LAB — CBC
HCT: 36.5 % (ref 36.0–46.0)
Hemoglobin: 11.3 g/dL — ABNORMAL LOW (ref 12.0–15.0)
MCH: 28.3 pg (ref 26.0–34.0)
MCHC: 31 g/dL (ref 30.0–36.0)
MCV: 91.3 fL (ref 80.0–100.0)
Platelets: 147 10*3/uL — ABNORMAL LOW (ref 150–400)
RBC: 4 MIL/uL (ref 3.87–5.11)
RDW: 16.5 % — ABNORMAL HIGH (ref 11.5–15.5)
WBC: 5.3 10*3/uL (ref 4.0–10.5)
nRBC: 0 % (ref 0.0–0.2)

## 2023-04-23 LAB — BASIC METABOLIC PANEL
Anion gap: 7 (ref 5–15)
BUN: 13 mg/dL (ref 8–23)
CO2: 22 mmol/L (ref 22–32)
Calcium: 8.6 mg/dL — ABNORMAL LOW (ref 8.9–10.3)
Chloride: 108 mmol/L (ref 98–111)
Creatinine, Ser: 1.18 mg/dL — ABNORMAL HIGH (ref 0.44–1.00)
GFR, Estimated: 45 mL/min — ABNORMAL LOW (ref >60)
Glucose, Bld: 99 mg/dL (ref 70–99)
Potassium: 4.7 mmol/L (ref 3.5–5.1)
Sodium: 137 mmol/L (ref 135–145)

## 2023-04-23 LAB — BRAIN NATRIURETIC PEPTIDE: B Natriuretic Peptide: 73.4 pg/mL (ref 0.0–100.0)

## 2023-04-23 LAB — TROPONIN I (HIGH SENSITIVITY)
Troponin I (High Sensitivity): 21 ng/L — ABNORMAL HIGH (ref <18)
Troponin I (High Sensitivity): 28 ng/L — ABNORMAL HIGH (ref <18)

## 2023-04-23 LAB — CBG MONITORING, ED: Glucose-Capillary: 77 mg/dL (ref 70–99)

## 2023-04-23 MED ORDER — ACETAMINOPHEN 325 MG PO TABS
650.0000 mg | ORAL_TABLET | Freq: Once | ORAL | Status: AC
  Administered 2023-04-23: 650 mg via ORAL
  Filled 2023-04-23: qty 2

## 2023-04-23 MED ORDER — DEXAMETHASONE 2 MG PO TABS: ORAL_TABLET | ORAL | 0 refills | Status: DC

## 2023-04-23 MED ORDER — DEXAMETHASONE SODIUM PHOSPHATE 10 MG/ML IJ SOLN
10.0000 mg | Freq: Once | INTRAMUSCULAR | Status: AC
  Administered 2023-04-23: 10 mg via INTRAVENOUS
  Filled 2023-04-23: qty 1

## 2023-04-23 MED ORDER — CEPHALEXIN 500 MG PO CAPS: 500.0000 mg | ORAL_CAPSULE | Freq: Four times a day (QID) | ORAL | 0 refills | Status: AC

## 2023-04-23 NOTE — ED Provider Notes (Signed)
[place] EMERGENCY DEPARTMENT AT [facility] Provider Note   CSN: [id number] Arrival date & time: 04/23/23  0801     History  Chief Complaint  Patient presents with   Hypertension   Shoulder Pain   Leg Swelling    [name] is a 89 y.o. female. With past medical history of hypertension, hyperlipidemia, CVA, CKD III, CAD s/p NSTEMI and stent placement, complete heart block s/p pacemaker who presents to the emergency department with shoulder pain, leg swelling, hypertension.   Patient is a poor historian.  Presents with daughter-in-law who helps to provide some of the history.  Patient states that she woke up this morning with shoulder pain and generally not feeling well.  From the daughter-in-law, it sounds like she has a history of chronic right shoulder pain.  She was on a steroid taper at some point for the right shoulder which had ended.  She woke up this morning complaining of right shoulder and neck pain and difficulty moving her right arm.  The daughter-in-law also states that she generally is not feeling well and was complaining of not feeling good.  She normally is able to get up and do for herself which which she was unable to do this morning.  The patient denies having any fevers, cough, sore throat, vomiting, diarrhea.  Additionally has been having lower extremity swelling.  This also sounds to be a more chronic issue.  States that when she was hospitalized previously she had significant swelling and they took the fluid off.  States that they were seen yesterday by her kidney doctor and started on a fluid pill.  She is endorsing some mild shortness of breath.  She denies chest pain or palpitations.  On chart review, appears she had nephrology outpatient appointment yesterday. Noted to have edema on exam. She is on amlodipine, metoprolol, hydralazine, losartan. They prescribed Torsemide 20mg  daily.   Hypertension Associated symptoms include shortness of breath.   Shoulder Pain Associated symptoms: neck pain        Home Medications Prior to Admission medications   Medication Sig Start Date End Date Taking? Authorizing Provider  acetaminophen (TYLENOL) 500 MG tablet Take 2 tablets (1,000 mg total) by mouth every 6 (six) hours as needed for mild pain or moderate pain. 03/25/23  Yes Rolly Salter, MD  aspirin EC 81 MG tablet Take 1 tablet (81 mg total) by mouth daily. Swallow whole. 04/05/23  Yes Fenton, Clint R, PA  cephALEXin (KEFLEX) 500 MG capsule Take 1 capsule (500 mg total) by mouth 4 (four) times daily for 5 days. 04/23/23 04/28/23 Yes Cristopher Peru, PA-C  ergocalciferol (VITAMIN D2) 1.25 MG (50000 UT) capsule Take 50,000 Units by mouth every 30 (thirty) days. 04/12/23  Yes [provider]  hydrALAZINE (APRESOLINE) 25 MG tablet Take 1 tablet (25 mg total) by mouth in the morning and at bedtime. 11/21/22 11/16/23 Yes Jonelle Sidle, MD  Menthol, Topical Analgesic, (BIOFREEZE ROLL-ON) 4 % GEL Apply 1 application. topically daily as needed (pain).   Yes [provider]  metoprolol succinate (TOPROL XL) 25 MG 24 hr tablet Take 0.5 tablets (12.5 mg total) by mouth daily. 02/11/23  Yes Sharlene Dory, NP  nitroGLYCERIN (NITROSTAT) 0.4 MG SL tablet PLACE 1 TABLET UNDER THE TONGUE EVERY 5 MINUTES AS NEEDED. 11/21/22  Yes Jonelle Sidle, MD  pantoprazole (PROTONIX) 40 MG tablet TAKE 1 TABLET BY MOUTH EVERY DAY 03/22/23  Yes Jonelle Sidle, MD  Rivaroxaban Carlena Hurl) 15  MG TABS tablet Take 1 tablet (15 mg total) by mouth daily with supper. 04/12/23  Yes Jonelle Sidle, MD  rosuvastatin (CRESTOR) 40 MG tablet TAKE 1 TABLET BY MOUTH EVERY DAY 12/14/22  Yes Jonelle Sidle, MD  dexamethasone (DECADRON) 2 MG tablet Take 4 mg three times daily for 5 days,Take 4 mg twice daily for 5 days,Take 4 mg daily for 5 days,Take 2 mg daily for 5 days then stop 04/23/23   Cristopher Peru, PA-C  magnesium oxide (MAG-OX) 400 (240 Mg) MG tablet Take 1  tablet (400 mg total) by mouth daily. Patient not taking: Reported on 04/23/2023 03/25/23   Rolly Salter, MD  torsemide (DEMADEX) 20 MG tablet Take 20 mg by mouth daily. Patient not taking: Reported on 04/23/2023 04/22/23 04/21/24  [provider]      Allergies    Atorvastatin, Contrast media [iodinated contrast media], Darvon [propoxyphene], and Codeine    Review of Systems   Review of Systems  Constitutional:  Positive for activity change.  Respiratory:  Positive for shortness of breath.   Musculoskeletal:  Positive for arthralgias and neck pain.  All other systems reviewed and are negative.   Physical Exam Updated Vital Signs BP (!) 148/106   Pulse 77   Temp 98 F (36.7 C) (Oral)   Resp 16   SpO2 99%  Physical Exam Vitals and nursing note reviewed.  Constitutional:      Appearance: She is obese. She is ill-appearing. She is not toxic-appearing.     Comments: Elderly  HENT:     Head: Normocephalic and atraumatic.  Eyes:     General: No scleral icterus.    Extraocular Movements: Extraocular movements intact.  Neck:   Cardiovascular:     Rate and Rhythm: Normal rate and regular rhythm.     Pulses: Normal pulses.  Pulmonary:     Effort: Pulmonary effort is normal.     Breath sounds: Normal breath sounds.  Abdominal:     General: Bowel sounds are normal.     Palpations: Abdomen is soft.  Musculoskeletal:        General: Tenderness present.       Arms:     Cervical back: Normal range of motion and neck supple. Tenderness present. Spinous process tenderness and muscular tenderness present.     Right lower leg: 1+ Pitting Edema present.     Left lower leg: 1+ Pitting Edema present.     Comments: Tenderness to the right shoulder, right trapezius, C-spine.  Radial pulses 2+.  Has mild decreased range of motion secondary to pain.  Skin:    General: Skin is warm and dry.     Capillary Refill: Capillary refill takes less than 2 seconds.     Findings: No  bruising.  Neurological:     General: No focal deficit present.     Mental Status: She is alert and oriented to person, place, and time. Mental status is at baseline.  Psychiatric:        Mood and Affect: Mood normal.        Behavior: Behavior normal.     ED Results / Procedures / Treatments   Labs (all labs ordered are listed, but only abnormal results are displayed) Labs Reviewed  BASIC METABOLIC PANEL - Abnormal; Notable for the following components:      Result Value   Creatinine, Ser 1.18 (*)    Calcium 8.6 (*)    GFR, Estimated 45 (*)  All other components within normal limits  CBC - Abnormal; Notable for the following components:   Hemoglobin 11.3 (*)    RDW 16.5 (*)    Platelets 147 (*)    All other components within normal limits  HEPATIC FUNCTION PANEL - Abnormal; Notable for the following components:   Total Protein 5.3 (*)    Albumin 2.5 (*)    Bilirubin, Direct 0.3 (*)    All other components within normal limits  URINALYSIS, ROUTINE W REFLEX MICROSCOPIC - Abnormal; Notable for the following components:   APPearance HAZY (*)    Protein, ur 30 (*)    Leukocytes,Ua MODERATE (*)    Bacteria, UA FEW (*)    All other components within normal limits  TROPONIN I (HIGH SENSITIVITY) - Abnormal; Notable for the following components:   Troponin I (High Sensitivity) 21 (*)    All other components within normal limits  TROPONIN I (HIGH SENSITIVITY) - Abnormal; Notable for the following components:   Troponin I (High Sensitivity) 28 (*)    All other components within normal limits  URINE CULTURE  BRAIN NATRIURETIC PEPTIDE  CBG MONITORING, ED    EKG EKG Interpretation Date/Time:  Tuesday April 23 2023 07:54:22 EDT Ventricular Rate:  74 PR Interval:  210 QRS Duration:  114 QT Interval:  430 QTC Calculation: 477 R Axis:   -64  Text Interpretation: Sinus rhythm with 1st degree A-V block with Premature atrial complexes Right bundle branch block Left anterior  fascicular block Abnormal ECG When compared with ECG of 04-Apr-2023 11:39, PREVIOUS ECG IS PRESENT Confirmed by Alona Bene [phone]) on 04/23/2023 12:39:18 PM  Radiology DG Chest 2 View  Result Date: 04/23/2023 CLINICAL DATA:  Hypertension. EXAM: CHEST - 2 VIEW COMPARISON:  03/02/2023 FINDINGS: The lungs are clear without focal pneumonia, edema, pneumothorax or pleural effusion. The cardio pericardial silhouette is enlarged. Hiatal hernia again noted. No acute bony abnormality. IMPRESSION: 1. No active cardiopulmonary disease. 2. Hiatal hernia. Electronically Signed   By: Kennith Center M.D.   On: 04/23/2023 09:11    Procedures Procedures   Medications Ordered in ED Medications  dexamethasone (DECADRON) injection 10 mg (10 mg Intravenous Given 04/23/23 0948)  acetaminophen (TYLENOL) tablet 650 mg (650 mg Oral Given 04/23/23 0949)    ED Course/ Medical Decision Making/ A&P Clinical Course as of 04/23/23 1450  Tue Apr 23, 2023  1043 Pending further lab workup at this time.  She is sleeping when I go to reevaluate her.  She does wake up easily and states that the pain is down to a 6/10 from 8 out of 10.  Although she still having pain, daughter-in-law at bedside states that she did not tolerate narcotics at all previously.  Cannot give NSAIDs given that she is on DOAC.  Will observe her for now. [LA]    Clinical Course User Index [LA] Cristopher Peru, PA-C    Medical Decision Making Amount and/or Complexity of Data Reviewed Labs: ordered. Radiology: ordered.  Risk OTC drugs. Prescription drug management.  Initial Impression and Ddx 89 year old female who presents to the emergency department with hypertension, arm pain, leg swelling. Patient PMH that increases complexity of ED encounter: Hypertension, hyperlipidemia, CVA, CKD stage III, CAD s/p stent placement, complete heart block s/p pacemaker  Interpretation of Diagnostics I independent reviewed and interpreted the labs as followed:  CBC without leukocytosis, stable anemia.  CBC with stable creatinine without electrolyte dysfunction, AKI or transaminitis.  Glucose is normal.  BNP is normal.  Initial  troponin 21, delta+7.  UA leuks, 6-10 wbc  - I independently visualized the following imaging with scope of interpretation limited to determining acute life threatening conditions related to emergency care: Chest x-ray, which revealed no acute findings  Patient Reassessment and Ultimate Disposition/Management 89 year old female who presents to the emergency department with multiple complaints.  Generally feeling unwell, right shoulder pain.  On chart review,.  That she had MRI in July of the C-spine with significant canal stenosis.  Was placed on a steroid taper and to follow-up with neurosurgery.  Do not see follow-up with neurosurgery in care everywhere. She does have some lower extremity edema, feeling mildly short of breath.  Will start with labs to screen for reasons that she is generally feeling unwell, not like herself including CBC, CMP, UA.  I know that her right shoulder pain is chronic, however given that she feels mildly short of breath will obtain troponin and BNP.  Will get a chest x-ray and EKG.  Will start with a steroid dose and Tylenol.  If still having pain we will escalate to opiates.  After I rechecked her from steroids, Tylenol, pain mildly improved.  At the time however the patient was resting comfortably/sleeping when I enter the room.  Will hold off on any further medications at this time.  Daughter-in-law at bedside also states that she does not tolerate opioids.   Chest x-ray with no acute findings. BMP, CBC, glucose, BNP with no significant findings. Troponin 21, 28, delta 7.  EKG no ischemic findings. UA shows possible cystitis.  She has moderate leuks, 6-10 WBCs, few bacteria.  Patient with no significant findings on her workup.  Believe her right shoulder pain is acute on chronic related to her cervical  stenosis.  Discussed this at length at bedside with patient and daughter-in-law Tobi Bastos.  They adamantly refused narcotic pain control for home.  States they already have lidocaine patches.  Discussed that I can restart dexamethasone taper which they are agreeable with so I have prescribed this.  Additionally they were to have follow-up with neurosurgery which did not happen.  I have sent a new referral to Dr. Jordan Likes who she was referred to on her discharge.  I have also given them the information for his office if they do not call to schedule an appointment.  She likely steroid injections for better control of her symptoms.  She has already been ruled out as a surgical candidate.  They are agreeable to this. Will also prescribe her Keflex for her equivocal UA.  I have sent off a urine culture.  Given her age however and her generally feeling malaise, will give her antibiotics if this is the cause.  No other acute findings found on her workup.  Will discharge at this time with return precautions.  I discussed this case with Dr. Jacqulyn Bath who agrees with the plan of care.  The patient has been appropriately medically screened and/or stabilized in the ED. I have low suspicion for any other emergent medical condition which would require further screening, evaluation or treatment in the ED or require inpatient management. At time of discharge the patient is hemodynamically stable and in no acute distress. I have discussed work-up results and diagnosis with patient and answered all questions. Patient is agreeable with discharge plan. We discussed strict return precautions for returning to the emergency department and they verbalized understanding.    Patient management required discussion with the following services or consulting groups:  None  Complexity of Problems  Addressed Chronic illness with exacerbation  Additional Data Reviewed and Analyzed Further history obtained from: Further history from spouse/family  member, Past medical history and medications listed in the EMR, Prior ED visit notes, Recent discharge summary, Care Everywhere, and Prior labs/imaging results  Patient Encounter Risk Assessment Prescriptions, SDOH impact on management, and Consideration of hospitalization  Final Clinical Impression(s) / ED Diagnoses Final diagnoses:  Cystitis  Shoulder pain, unspecified chronicity, unspecified laterality  Cervical stenosis of spine    Rx / DC Orders ED Discharge Orders          Ordered    dexamethasone (DECADRON) 2 MG tablet        04/23/23 1416    cephALEXin (KEFLEX) 500 MG capsule  4 times daily        04/23/23 1417    Ambulatory referral to Neurosurgery        04/23/23 1420              Cristopher Peru, PA-C 04/23/23 1450    Long, Arlyss Repress, MD 04/26/23 [phone]

## 2023-04-23 NOTE — Discharge Instructions (Addendum)
You were seen in the emergency department today for shoulder pain.  I am represcribing your steroid taper.  Additionally I have sent a new referral to Dr. Dutch Quint with neurosurgery for you to be seen for more definitive treatment of your pain.  If they do not call you in the next 3 days, I have placed this information in your discharge paperwork to call and schedule an appointment.  Additionally you do have a UTI, I am placing you on antibiotics for the next 5 days.  Please use lidocaine patches and Tylenol as well for your shoulder pain.  Please return to the emergency department for worsening symptoms.

## 2023-04-23 NOTE — ED Triage Notes (Addendum)
Pt came in via POV d/t yesterday at her PCP her BP was elevated, her Rt shoulder was hurting & she had fluid in her legs (per pt). BP 180/90 while in triage & ankles are swollen. Pt reports she was given meds to take the fluid off but has not taken it, denies having CHF but does endorse having 3 cardiac stents. A/Ox4, rates her Rt shoulder pain 7/10 & says its worse when she raises that arm. Denies CP or SOB.

## 2023-04-24 ENCOUNTER — Telehealth (HOSPITAL_COMMUNITY): Payer: Self-pay

## 2023-04-24 ENCOUNTER — Encounter (HOSPITAL_COMMUNITY): Payer: Medicare HMO

## 2023-04-24 NOTE — Telephone Encounter (Signed)
[name] called in 7:45 and spoke with Verlon Au  states her mother would not be attending 7:30 appt.  States she went to the ED yesterday and this morning woke up with leg pain so told her daughter she did not feel up to coming to PT.  Daughter states patient is aware of upcoming appt Friday and plans to attend.     7:51 AM, 04/24/23  [place]  MPT [place] [facility] [place] [phone]

## 2023-04-26 ENCOUNTER — Encounter (HOSPITAL_COMMUNITY): Payer: Medicare HMO

## 2023-04-27 LAB — CUP PACEART REMOTE DEVICE CHECK
Date Time Interrogation Session: 20240816230544
Implantable Pulse Generator Implant Date: 20240506

## 2023-04-28 ENCOUNTER — Telehealth (HOSPITAL_BASED_OUTPATIENT_CLINIC_OR_DEPARTMENT_OTHER): Payer: Self-pay | Admitting: *Deleted

## 2023-04-28 NOTE — Telephone Encounter (Signed)
Post ED Visit - Positive Culture Follow-up  Culture report reviewed by antimicrobial stewardship pharmacist: [facility] []  Enzo Bi, Pharm.D. []  Celedonio Miyamoto, Pharm.D., BCPS AQ-ID []  Garvin Fila, Pharm.D., BCPS []  Georgina Pillion, Pharm.D., BCPS []  [place], [address].D., BCPS, AAHIVP []  Estella Husk, Pharm.D., BCPS, AAHIVP []  Lysle Pearl, PharmD, BCPS []  Phillips Climes, PharmD, BCPS []  Agapito Games, PharmD, BCPS []  Verlan Friends, PharmD []  Mervyn Gay, PharmD, BCPS [x]  Delmar Landau, PharmD  [facility] []  Len Childs, PharmD []  Greer Pickerel, PharmD []  Adalberto Cole, PharmD []  Perlie Gold, Rph []  Lonell Face) Jean Rosenthal, PharmD []  Earl Many, PharmD []  Junita Push, PharmD []  Dorna Leitz, PharmD []  Terrilee Files, PharmD []  Lynann Beaver, PharmD []  Keturah Barre, PharmD []  Loralee Pacas, PharmD []  Bernadene Person, PharmD   Positive urine culture Treated with Cephalexin, organism sensitive to the same and no further patient follow-up is required at this time.  [name] 04/28/2023, 2:11 PM

## 2023-04-29 ENCOUNTER — Ambulatory Visit: Payer: Medicare HMO

## 2023-04-29 ENCOUNTER — Telehealth: Payer: Self-pay

## 2023-04-29 DIAGNOSIS — Z8673 Personal history of transient ischemic attack (TIA), and cerebral infarction without residual deficits: Secondary | ICD-10-CM | POA: Diagnosis not present

## 2023-04-29 NOTE — Telephone Encounter (Signed)
Transition Care Management Unsuccessful Follow-up Telephone Call  Date of discharge and from where:  04/23/2023 [facility]  Attempts:  1st Attempt  Reason for unsuccessful TCM follow-up call:  No answer/busy  Venita Seng [facility]  [facility] Population Health Community Resource Care Guide   [email or web address]  ?? [phone]   Website: [email or web address]  [email or web address]

## 2023-04-30 ENCOUNTER — Telehealth: Payer: Self-pay

## 2023-04-30 NOTE — Telephone Encounter (Signed)
Transition Care Management Follow-up Telephone Call Date of discharge and from where: 04/23/2023 [facility] How have you been since you were released from the hospital? Patient stated she is feeling better and the swelling in her legs and feet is gone. Any questions or concerns? No  Items Reviewed: Did the pt receive and understand the discharge instructions provided? Yes  Medications obtained and verified? Yes  Other? No  Any new allergies since your discharge? No  Dietary orders reviewed? Yes Do you have support at home? Yes   Follow up appointments reviewed:  PCP Hospital f/u appt confirmed? Yes  Scheduled to see Lucina Mellow. Durwin Nora, MD on 05/03/2023 @ [facility]. Specialist Hospital f/u appt confirmed? No  Scheduled to see  on  @ . Are transportation arrangements needed? No  If their condition worsens, is the pt aware to call PCP or go to the Emergency Dept.? Yes Was the patient provided with contact information for the PCP's office or ED? Yes Was to pt encouraged to call back with questions or concerns? Yes  Dyami Umbach [facility]  [facility] Population Health Community Resource Care Guide   ??[email or web address]  ?? [phone]   Website: [email or web address]  [email or web address]

## 2023-05-03 ENCOUNTER — Encounter: Payer: Self-pay | Admitting: Internal Medicine

## 2023-05-03 ENCOUNTER — Ambulatory Visit: Payer: Medicare HMO | Admitting: Internal Medicine

## 2023-05-03 VITALS — BP 132/90 | HR 91 | Ht 65.0 in | Wt 187.2 lb

## 2023-05-03 DIAGNOSIS — K219 Gastro-esophageal reflux disease without esophagitis: Secondary | ICD-10-CM | POA: Diagnosis not present

## 2023-05-03 DIAGNOSIS — M4802 Spinal stenosis, cervical region: Secondary | ICD-10-CM

## 2023-05-03 DIAGNOSIS — I10 Essential (primary) hypertension: Secondary | ICD-10-CM

## 2023-05-03 DIAGNOSIS — I480 Paroxysmal atrial fibrillation: Secondary | ICD-10-CM | POA: Diagnosis not present

## 2023-05-03 DIAGNOSIS — N1831 Chronic kidney disease, stage 3a: Secondary | ICD-10-CM | POA: Diagnosis not present

## 2023-05-03 NOTE — Progress Notes (Signed)
Established Patient Office Visit  Subjective   Patient ID: EMAH DAHIR, female    DOB: 1934-08-26  Age: 89 y.o. MRN: [id number]  Chief Complaint  Patient presents with   Osteoarthritis    3 month follow up.   Ms. Buntain returns to care today for routine follow-up.  She was last evaluated by me on 5/23.  No medication changes were made at that time and 32-month follow-up was arranged.  In the interim, she was admitted to [facility] 7/13 - 7/15 in the setting of numbness in her hands and feet.  She was evaluated by neurology and ultimately transferred to [facility] for further evaluation.  Cervical spondylosis with stenosis and cord edema was identified.  Neurosurgery was consulted.  Patient's symptoms resolved with Decadron.  She was able to participate in physical therapy.  Deemed high risk surgical candidate due to chronic medical conditions.  Conservative management recommended.  She was referred to neurosurgery in the outpatient setting, continued on Plavix, and discharged on Decadron taper..  She has been evaluated by electrophysiology since hospital discharge.  Patient has an ILR that was placed in the setting of a cryptogenic stroke in May.  The device clinic was alerted to a 10-hour episode of arrhythmia that occurred on 7/20.  Patient was started on anticoagulation (Eliquis 5 mg twice daily) as her CHA2DS2-VASc score was 7.  Plavix discontinued.  Most recently, she presented to the emergency department on 8/13 endorsing HTN, shoulder pain, and leg swelling.  Her workup was largely reassuring aside from urine study concerning for UTI.  Treated with Keflex.  Ms Mammenga reports feeling well today.  She is asymptomatic and has no acute concerns to discuss.  Past Medical History:  Diagnosis Date   Arthritis    CKD (chronic kidney disease), stage III (HCC)    Complete heart block (HCC) 11/16/2016   a. transient during NSTEMI, resolved with revascularization.   Coronary artery disease 11/2009   a.  with prior stent placement to the ramus intermedius and RCA. b. NSTEMI 11/2016 s/p DES to DES to distal Cx with moderate residual dz.   CVA (cerebral vascular accident) [facility])    Essential hypertension    Mixed hyperlipidemia    Non-ST elevation (NSTEMI) myocardial infarction (HCC) 11/16/2016   Obesity    Osteoarthritis    Reflux esophagitis    Sleep apnea 09/27/2010   Untreated, REM 64.7/hr AHI 18.5/hr RDI 19.2/hr. Patuent refused CPAP therapy   Past Surgical History:  Procedure Laterality Date   CHOLECYSTECTOMY     CORONARY ANGIOPLASTY WITH STENT PLACEMENT  2007   A 3.0x27mm CYPHER stent post dilated to 3.29 mm the 100% occlusion was reduced to 0%   CORONARY STENT INTERVENTION N/A 11/16/2016   Procedure: Coronary Stent Intervention;  Surgeon: Tonny Bollman, MD;  Location: [facility] INVASIVE CV LAB;  Service: Cardiovascular;  Laterality: N/A;   LEFT HEART CATH AND CORONARY ANGIOGRAPHY N/A 11/16/2016   Procedure: Left Heart Cath and Coronary Angiography;  Surgeon: Tonny Bollman, MD;  Location: [facility] INVASIVE CV LAB;  Service: Cardiovascular;  Laterality: N/A;   LEFT HEART CATHETERIZATION WITH CORONARY ANGIOGRAM N/A 07/12/2014   Procedure: LEFT HEART CATHETERIZATION WITH CORONARY ANGIOGRAM;  Surgeon: Micheline Chapman, MD;  Location: [facility] CATH LAB;  Service: Cardiovascular;  Laterality: N/A;   TEMPORARY PACEMAKER N/A 11/16/2016   Procedure: Temporary Pacemaker;  Surgeon: Tonny Bollman, MD;  Location: [facility] INVASIVE CV LAB;  Service: Cardiovascular;  Laterality: N/A;   Social History   Tobacco Use  Smoking status: Never   Smokeless tobacco: Never  Vaping Use   Vaping status: Never Used  Substance Use Topics   Alcohol use: No   Drug use: No   Family History  Problem Relation Age of Onset   Hypertension Mother    Aneurysm Mother        died at age 45   Other Father        unsure of medical history   Hypertension Other    Allergies  Allergen Reactions   Atorvastatin Nausea And Vomiting    Contrast Media [Iodinated Contrast Media] Other (See Comments)    Nausea/Vomiting and Shortness of Breath   Darvon [Propoxyphene] Nausea And Vomiting   Codeine Nausea And Vomiting and Anxiety   Review of Systems  Constitutional:  Negative for chills and fever.  HENT:  Negative for sore throat.   Respiratory:  Negative for cough and shortness of breath.   Cardiovascular:  Negative for chest pain, palpitations and leg swelling.  Gastrointestinal:  Negative for abdominal pain, blood in stool, constipation, diarrhea, nausea and vomiting.  Genitourinary:  Negative for dysuria and hematuria.  Musculoskeletal:  Negative for myalgias.  Skin:  Negative for itching and rash.  Neurological:  Negative for dizziness and headaches.  Psychiatric/Behavioral:  Negative for depression and suicidal ideas.      Objective:     BP (!) 132/90 (BP Location: Left Arm, Patient Position: Sitting, Cuff Size: Normal)   Pulse 91   Ht 5\' 5"  (1.651 m)   Wt 187 lb 3.2 oz (84.9 kg)   SpO2 97%   BMI 31.15 kg/m  BP Readings from Last 3 Encounters:  05/03/23 (!) 132/90  04/23/23 (!) 148/106  04/04/23 126/72   Physical Exam Vitals reviewed.  Constitutional:      General: She is not in acute distress.    Appearance: Normal appearance. She is obese. She is not toxic-appearing.     Comments: Examined in wheelchair  HENT:     Head: Normocephalic and atraumatic.     Right Ear: External ear normal.     Left Ear: External ear normal.     Nose: Nose normal. No congestion or rhinorrhea.     Mouth/Throat:     Mouth: Mucous membranes are moist.     Pharynx: Oropharynx is clear. No oropharyngeal exudate or posterior oropharyngeal erythema.  Eyes:     General: No scleral icterus.    Extraocular Movements: Extraocular movements intact.     Conjunctiva/sclera: Conjunctivae normal.     Pupils: Pupils are equal, round, and reactive to light.  Cardiovascular:     Rate and Rhythm: Normal rate and regular rhythm.      Pulses: Normal pulses.     Heart sounds: Normal heart sounds. No murmur heard.    No friction rub. No gallop.  Pulmonary:     Effort: Pulmonary effort is normal.     Breath sounds: Normal breath sounds. No wheezing, rhonchi or rales.  Abdominal:     General: Abdomen is flat. Bowel sounds are normal. There is no distension.     Palpations: Abdomen is soft.     Tenderness: There is no abdominal tenderness.  Musculoskeletal:        General: No swelling. Normal range of motion.     Cervical back: Normal range of motion.     Right lower leg: No edema.     Left lower leg: No edema.  Lymphadenopathy:     Cervical: No cervical adenopathy.  Skin:  General: Skin is warm and dry.     Capillary Refill: Capillary refill takes less than 2 seconds.     Coloration: Skin is not jaundiced.  Neurological:     General: No focal deficit present.     Mental Status: She is alert and oriented to person, place, and time.  Psychiatric:        Mood and Affect: Mood normal.        Behavior: Behavior normal.   Last CBC Lab Results  Component Value Date   WBC 5.3 04/23/2023   HGB 11.3 (L) 04/23/2023   HCT 36.5 04/23/2023   MCV 91.3 04/23/2023   MCH 28.3 04/23/2023   RDW 16.5 (H) 04/23/2023   PLT 147 (L) 04/23/2023   Last metabolic panel Lab Results  Component Value Date   GLUCOSE 99 04/23/2023   NA 137 04/23/2023   K 4.7 04/23/2023   CL 108 04/23/2023   CO2 22 04/23/2023   BUN 13 04/23/2023   CREATININE 1.18 (H) 04/23/2023   GFRNONAA 45 (L) 04/23/2023   CALCIUM 8.6 (L) 04/23/2023   PROT 5.3 (L) 04/23/2023   ALBUMIN 2.5 (L) 04/23/2023   LABGLOB 2.9 01/19/2015   AGRATIO 1.3 01/19/2015   BILITOT 0.7 04/23/2023   ALKPHOS 56 04/23/2023   AST 21 04/23/2023   ALT 17 04/23/2023   ANIONGAP 7 04/23/2023   Last lipids Lab Results  Component Value Date   CHOL 103 08/21/2022   HDL 41 08/21/2022   LDLCALC 50 08/21/2022   TRIG 58 08/21/2022   CHOLHDL 2.5 08/21/2022   Last hemoglobin  A1c Lab Results  Component Value Date   HGBA1C 6.0 (H) 08/20/2022   Last thyroid functions Lab Results  Component Value Date   TSH 1.437 08/28/2015   Last vitamin B12 and Folate Lab Results  Component Value Date   VITAMINB12 433 03/25/2023   FOLATE 12.3 03/25/2023     Assessment & Plan:   Problem List Items Addressed This Visit       Essential hypertension    Adequately controlled on current antihypertensive regimen.  No medication changes are indicated today.      Paroxysmal atrial fibrillation (HCC) - Primary    New diagnosis.  10-hour run of arrhythmia noted on 7/20.  She is currently prescribed Xarelto 15 mg daily and remains on ASA 81 mg daily.  She is additionally prescribed Toprol-XL 12.5 mg daily.  Regular rate and rhythm detected on exam today. -No medication changes are indicated today.  Cardiology follow-up is scheduled for next month (9/18).      GERD without esophagitis    Symptoms remain adequately controlled with Protonix 40 mg daily.      CKD (chronic kidney disease), stage III (HCC)    Renal function is stable on latest labs.  She will be seen by nephrology for follow-up next month (9/9).      Cervical stenosis of spine    Recent hospital admission in the setting of numbness of hands and feet bilaterally.  Imaging revealed multilevel spondylosis of the cervical spine with progressive high-grade central canal stenosis at C2-3 and C3-4 with signal changes suggestive of cord edema.  Evaluated by neurosurgery.  Surgical risk deemed too high.  Conservative management recommended.  Treated with Decadron and was discharged on Decadron taper.  She has been referred to neurosurgery and will call their office on Monday (8/26 to schedule follow-up).      Return in about 3 months (around 08/03/2023).   Lucina Mellow  Durwin Nora, MD

## 2023-05-03 NOTE — Patient Instructions (Signed)
It was a pleasure to see you today.  Thank you for giving us the opportunity to be involved in your care.  Below is a brief recap of your visit and next steps.  We will plan to see you again in 3 months.  Summary No medication changes today. We will plan for follow up in 3 months.   

## 2023-05-06 ENCOUNTER — Telehealth: Payer: Self-pay | Admitting: Cardiology

## 2023-05-06 DIAGNOSIS — Z0279 Encounter for issue of other medical certificate: Secondary | ICD-10-CM

## 2023-05-06 NOTE — Telephone Encounter (Signed)
[name] would like to have FMLA paperwork updated. She states it expires on 9/01. She would like to know when she can bring it by the office and how much she will need to pay.

## 2023-05-06 NOTE — Telephone Encounter (Signed)
I spoke with daughter and told her it is $29 and she will bring papers in to the office.

## 2023-05-06 NOTE — Telephone Encounter (Signed)
Forms from Pt's daughter received on 05/06/2023. Completed billing and authorization forms.Fee received. All forms uploaded to patients chart. Billing form faxed to [facility] Billing dept. Blank forms given to nurse to give to Dr. Diona Browner when he returns to the office.   AGA 05/06/2023

## 2023-05-08 ENCOUNTER — Encounter: Payer: Self-pay | Admitting: Internal Medicine

## 2023-05-08 DIAGNOSIS — M4802 Spinal stenosis, cervical region: Secondary | ICD-10-CM | POA: Insufficient documentation

## 2023-05-08 NOTE — Assessment & Plan Note (Signed)
Renal function is stable on latest labs.  She will be seen by nephrology for follow-up next month (9/9).

## 2023-05-08 NOTE — Assessment & Plan Note (Signed)
New diagnosis.  10-hour run of arrhythmia noted on 7/20.  She is currently prescribed Xarelto 15 mg daily and remains on ASA 81 mg daily.  She is additionally prescribed Toprol-XL 12.5 mg daily.  Regular rate and rhythm detected on exam today. -No medication changes are indicated today.  Cardiology follow-up is scheduled for next month (9/18).

## 2023-05-08 NOTE — Assessment & Plan Note (Signed)
Adequately controlled on current antihypertensive regimen.  No medication changes are indicated today.

## 2023-05-08 NOTE — Assessment & Plan Note (Signed)
Recent hospital admission in the setting of numbness of hands and feet bilaterally.  Imaging revealed multilevel spondylosis of the cervical spine with progressive high-grade central canal stenosis at C2-3 and C3-4 with signal changes suggestive of cord edema.  Evaluated by neurosurgery.  Surgical risk deemed too high.  Conservative management recommended.  Treated with Decadron and was discharged on Decadron taper.  She has been referred to neurosurgery and will call their office on Monday (8/26 to schedule follow-up).

## 2023-05-08 NOTE — Assessment & Plan Note (Signed)
Symptoms remain adequately controlled with Protonix 40 mg daily.

## 2023-05-10 NOTE — Progress Notes (Signed)
Carelink Summary Report / Loop Recorder 

## 2023-05-15 ENCOUNTER — Ambulatory Visit (HOSPITAL_COMMUNITY): Payer: Medicare HMO | Admitting: Physician Assistant

## 2023-05-20 DIAGNOSIS — N2581 Secondary hyperparathyroidism of renal origin: Secondary | ICD-10-CM | POA: Diagnosis not present

## 2023-05-20 DIAGNOSIS — R809 Proteinuria, unspecified: Secondary | ICD-10-CM | POA: Diagnosis not present

## 2023-05-20 DIAGNOSIS — I129 Hypertensive chronic kidney disease with stage 1 through stage 4 chronic kidney disease, or unspecified chronic kidney disease: Secondary | ICD-10-CM | POA: Diagnosis not present

## 2023-05-20 DIAGNOSIS — N1832 Chronic kidney disease, stage 3b: Secondary | ICD-10-CM | POA: Diagnosis not present

## 2023-05-29 ENCOUNTER — Encounter: Payer: Self-pay | Admitting: Cardiology

## 2023-05-29 ENCOUNTER — Ambulatory Visit: Payer: Medicare HMO | Attending: Cardiology | Admitting: Cardiology

## 2023-05-29 VITALS — BP 142/70 | HR 96 | Ht 65.0 in | Wt 197.8 lb

## 2023-05-29 DIAGNOSIS — I480 Paroxysmal atrial fibrillation: Secondary | ICD-10-CM

## 2023-05-29 DIAGNOSIS — N183 Chronic kidney disease, stage 3 unspecified: Secondary | ICD-10-CM | POA: Diagnosis not present

## 2023-05-29 DIAGNOSIS — I25119 Atherosclerotic heart disease of native coronary artery with unspecified angina pectoris: Secondary | ICD-10-CM | POA: Diagnosis not present

## 2023-05-29 DIAGNOSIS — Z8673 Personal history of transient ischemic attack (TIA), and cerebral infarction without residual deficits: Secondary | ICD-10-CM | POA: Diagnosis not present

## 2023-05-29 DIAGNOSIS — I10 Essential (primary) hypertension: Secondary | ICD-10-CM | POA: Diagnosis not present

## 2023-05-29 NOTE — Patient Instructions (Signed)
Medication Instructions:   Your physician recommends that you continue on your current medications as directed. Please refer to the Current Medication list given to you today.  Labwork: None today  Testing/Procedures: None today  Follow-Up: 3 months Dr.McDowell  Any Other Special Instructions Will Be Listed Below (If Applicable).  If you need a refill on your cardiac medications before your next appointment, please call your pharmacy.

## 2023-05-29 NOTE — Progress Notes (Signed)
Cardiology Office Note  Date: 05/29/2023   ID: [name], DOB Dec 29, 1933, MRN [id number]  History of Present Illness: [name] is an 89 y.o. female last seen in June by Ms. Philis Nettle NP, I reviewed the note.  She has also had interval follow-up in the atrial fibrillation clinic.  She was started on anticoagulation in July given documentation of paroxysmal atrial fibrillation on ILR and CHA2DS2-VASc score of 7.  She is tolerating Xarelto at this point, no spontaneous bleeding problems.  Also does not voice any sense of palpitations.  She is in a wheelchair today, has intermittent leg swelling, decreased stamina but does plan to consider repeat PT assessment.  No obvious falls.  [employer] ILR in place with follow-up with Dr. Ladona Ridgel.  Interrogation in August indicated 1.2% AF burden.  I reviewed her medications.  Also reviewed lab work from August.  Physical Exam: VS:  BP (!) 142/70   Pulse 96   Ht 5\' 5"  (1.651 m)   Wt 197 lb 12.8 oz (89.7 kg)   SpO2 98%   BMI 32.92 kg/m , BMI Body mass index is 32.92 kg/m.  Wt Readings from Last 3 Encounters:  05/29/23 197 lb 12.8 oz (89.7 kg)  05/03/23 187 lb 3.2 oz (84.9 kg)  04/04/23 182 lb 9.6 oz (82.8 kg)    General: Patient appears comfortable at rest. HEENT: Conjunctiva and lids normal. Neck: Supple, no elevated JVP or carotid bruits. Lungs: Clear to auscultation, nonlabored breathing at rest. Cardiac: Regular rate and rhythm, no S3, 2/6 systolic murmur. Extremities: Mild lower leg edema.  ECG:  An ECG dated 04/23/2023 was personally reviewed today and demonstrated:  Sinus rhythm with prolonged PR interval, PAC, right bundle branch block, left anterior fascicular block.  Labwork: 03/25/2023: Magnesium 1.5 04/23/2023: ALT 17; AST 21; B Natriuretic Peptide 73.4; BUN 13; Creatinine, Ser 1.18; Hemoglobin 11.3; Platelets 147; Potassium 4.7; Sodium 137     Component Value Date/Time   CHOL 103 08/21/2022 0431   CHOL 146 01/19/2015  1048   TRIG 58 08/21/2022 0431   HDL 41 08/21/2022 0431   HDL 48 01/19/2015 1048   CHOLHDL 2.5 08/21/2022 0431   VLDL 12 08/21/2022 0431   LDLCALC 50 08/21/2022 0431   LDLCALC 77 01/19/2015 1048   Other Studies Reviewed Today:  Echocardiogram 03/25/2023:  1. Left ventricular ejection fraction, by estimation, is 65 to 70%. The  left ventricle has normal function. The left ventricle has no regional  wall motion abnormalities. There is mild concentric left ventricular  hypertrophy. Left ventricular diastolic  parameters are consistent with Grade I diastolic dysfunction (impaired  relaxation).   2. Right ventricular systolic function is normal. The right ventricular  size is normal.   3. Left atrial size was mildly dilated.   4. The mitral valve is normal in structure. Trivial mitral valve  regurgitation. No evidence of mitral stenosis.   5. The aortic valve is tricuspid. There is mild calcification of the  aortic valve. Aortic valve regurgitation is trivial. Aortic valve  sclerosis/calcification is present, without any evidence of aortic  stenosis.   6. The inferior vena cava is normal in size with greater than 50%  respiratory variability, suggesting right atrial pressure of 3 mmHg.   Assessment and Plan:  1.  CAD status post stent intervention to the ramus intermedius and RCA in 2011 followed by NSTEMI in 2018 status post DES to the distal circumflex.  LVEF 65 to 70%.  No angina at current  level of activity.  Medications have been adjusted, currently on aspirin, Toprol-XL, Crestor, and as needed nitroglycerin.  Plavix was discontinued with initiation of DOAC.   2.  Paroxysmal atrial fibrillation with CHA2DS2-VASc score of 7.  She is tolerating Xarelto 15 mg daily, no spontaneous bleeding problems reported and interval lab work reviewed.  She does not report any sense of palpitations.  Last ILR interrogation indicated approximately 1% rhythm burden..  3.  Mixed hyperlipidemia, on  Crestor.  LDL 50 in December 2023.   4.  CKD stage IIIb, creatinine 1.18 in August.   5.  Essential hypertension.  No changes to current regimen.  She is also on hydralazine.   6.  History of stroke in December 2023, likely embolic based on neuroimaging.  Disposition:  Follow up  3 months.  Signed, Jonelle Sidle, M.D., F.A.C.C. [facility]

## 2023-05-31 ENCOUNTER — Other Ambulatory Visit: Payer: Self-pay | Admitting: Cardiology

## 2023-06-03 ENCOUNTER — Ambulatory Visit (INDEPENDENT_AMBULATORY_CARE_PROVIDER_SITE_OTHER): Payer: Medicare HMO

## 2023-06-03 DIAGNOSIS — I480 Paroxysmal atrial fibrillation: Secondary | ICD-10-CM | POA: Diagnosis not present

## 2023-06-04 LAB — CUP PACEART REMOTE DEVICE CHECK
Date Time Interrogation Session: 20240918230833
Implantable Pulse Generator Implant Date: 20240506

## 2023-06-09 ENCOUNTER — Inpatient Hospital Stay (HOSPITAL_COMMUNITY)
Admission: EM | Admit: 2023-06-09 | Discharge: 2023-06-12 | DRG: 291 | Disposition: A | Discharge status: Home / Self Care | Payer: Medicare HMO | Attending: Infectious Diseases | Admitting: Infectious Diseases

## 2023-06-09 ENCOUNTER — Other Ambulatory Visit: Payer: Self-pay

## 2023-06-09 DIAGNOSIS — I2510 Atherosclerotic heart disease of native coronary artery without angina pectoris: Secondary | ICD-10-CM | POA: Diagnosis present

## 2023-06-09 DIAGNOSIS — E782 Mixed hyperlipidemia: Secondary | ICD-10-CM | POA: Diagnosis present

## 2023-06-09 DIAGNOSIS — R600 Localized edema: Principal | ICD-10-CM | POA: Diagnosis present

## 2023-06-09 DIAGNOSIS — I480 Paroxysmal atrial fibrillation: Secondary | ICD-10-CM | POA: Diagnosis present

## 2023-06-09 DIAGNOSIS — Z8673 Personal history of transient ischemic attack (TIA), and cerebral infarction without residual deficits: Secondary | ICD-10-CM

## 2023-06-09 DIAGNOSIS — Z955 Presence of coronary angioplasty implant and graft: Secondary | ICD-10-CM

## 2023-06-09 DIAGNOSIS — E669 Obesity, unspecified: Secondary | ICD-10-CM | POA: Diagnosis present

## 2023-06-09 DIAGNOSIS — Z950 Presence of cardiac pacemaker: Secondary | ICD-10-CM

## 2023-06-09 DIAGNOSIS — G4733 Obstructive sleep apnea (adult) (pediatric): Secondary | ICD-10-CM | POA: Diagnosis present

## 2023-06-09 DIAGNOSIS — I130 Hypertensive heart and chronic kidney disease with heart failure and stage 1 through stage 4 chronic kidney disease, or unspecified chronic kidney disease: Secondary | ICD-10-CM | POA: Diagnosis not present

## 2023-06-09 DIAGNOSIS — R531 Weakness: Secondary | ICD-10-CM

## 2023-06-09 DIAGNOSIS — I252 Old myocardial infarction: Secondary | ICD-10-CM

## 2023-06-09 DIAGNOSIS — Z888 Allergy status to other drugs, medicaments and biological substances status: Secondary | ICD-10-CM

## 2023-06-09 DIAGNOSIS — N1832 Chronic kidney disease, stage 3b: Secondary | ICD-10-CM | POA: Diagnosis present

## 2023-06-09 DIAGNOSIS — I10 Essential (primary) hypertension: Secondary | ICD-10-CM | POA: Diagnosis not present

## 2023-06-09 DIAGNOSIS — M1990 Unspecified osteoarthritis, unspecified site: Secondary | ICD-10-CM | POA: Diagnosis present

## 2023-06-09 DIAGNOSIS — I442 Atrioventricular block, complete: Secondary | ICD-10-CM | POA: Diagnosis present

## 2023-06-09 DIAGNOSIS — R509 Fever, unspecified: Secondary | ICD-10-CM

## 2023-06-09 DIAGNOSIS — Z8249 Family history of ischemic heart disease and other diseases of the circulatory system: Secondary | ICD-10-CM

## 2023-06-09 DIAGNOSIS — Z7901 Long term (current) use of anticoagulants: Secondary | ICD-10-CM

## 2023-06-09 DIAGNOSIS — Z6834 Body mass index (BMI) 34.0-34.9, adult: Secondary | ICD-10-CM

## 2023-06-09 DIAGNOSIS — E46 Unspecified protein-calorie malnutrition: Secondary | ICD-10-CM | POA: Diagnosis present

## 2023-06-09 DIAGNOSIS — N2581 Secondary hyperparathyroidism of renal origin: Secondary | ICD-10-CM | POA: Diagnosis present

## 2023-06-09 DIAGNOSIS — I452 Bifascicular block: Secondary | ICD-10-CM | POA: Diagnosis present

## 2023-06-09 DIAGNOSIS — Z79899 Other long term (current) drug therapy: Secondary | ICD-10-CM

## 2023-06-09 DIAGNOSIS — Z7982 Long term (current) use of aspirin: Secondary | ICD-10-CM

## 2023-06-09 DIAGNOSIS — Z555 Less than a high school diploma: Secondary | ICD-10-CM

## 2023-06-09 DIAGNOSIS — Z91041 Radiographic dye allergy status: Secondary | ICD-10-CM

## 2023-06-09 DIAGNOSIS — I5033 Acute on chronic diastolic (congestive) heart failure: Secondary | ICD-10-CM

## 2023-06-09 DIAGNOSIS — Z885 Allergy status to narcotic agent status: Secondary | ICD-10-CM

## 2023-06-09 LAB — COMPREHENSIVE METABOLIC PANEL
ALT: 16 U/L (ref 0–44)
AST: 24 U/L (ref 15–41)
Albumin: 2.4 g/dL — ABNORMAL LOW (ref 3.5–5.0)
Alkaline Phosphatase: 62 U/L (ref 38–126)
Anion gap: 8 (ref 5–15)
BUN: 15 mg/dL (ref 8–23)
CO2: 24 mmol/L (ref 22–32)
Calcium: 8.2 mg/dL — ABNORMAL LOW (ref 8.9–10.3)
Chloride: 106 mmol/L (ref 98–111)
Creatinine, Ser: 1.24 mg/dL — ABNORMAL HIGH (ref 0.44–1.00)
GFR, Estimated: 42 mL/min — ABNORMAL LOW (ref >60)
Glucose, Bld: 131 mg/dL — ABNORMAL HIGH (ref 70–99)
Potassium: 3.7 mmol/L (ref 3.5–5.1)
Sodium: 138 mmol/L (ref 135–145)
Total Bilirubin: 0.6 mg/dL (ref 0.3–1.2)
Total Protein: 5.7 g/dL — ABNORMAL LOW (ref 6.5–8.1)

## 2023-06-09 LAB — CBC WITH DIFFERENTIAL/PLATELET
Abs Immature Granulocytes: 0.03 10*3/uL (ref 0.00–0.07)
Basophils Absolute: 0 10*3/uL (ref 0.0–0.1)
Basophils Relative: 0 %
Eosinophils Absolute: 0.1 10*3/uL (ref 0.0–0.5)
Eosinophils Relative: 1 %
HCT: 35.5 % — ABNORMAL LOW (ref 36.0–46.0)
Hemoglobin: 11.1 g/dL — ABNORMAL LOW (ref 12.0–15.0)
Immature Granulocytes: 0 %
Lymphocytes Relative: 21 %
Lymphs Abs: 2.2 10*3/uL (ref 0.7–4.0)
MCH: 28.9 pg (ref 26.0–34.0)
MCHC: 31.3 g/dL (ref 30.0–36.0)
MCV: 92.4 fL (ref 80.0–100.0)
Monocytes Absolute: 1.2 10*3/uL — ABNORMAL HIGH (ref 0.1–1.0)
Monocytes Relative: 12 %
Neutro Abs: 6.8 10*3/uL (ref 1.7–7.7)
Neutrophils Relative %: 66 %
Platelets: 278 10*3/uL (ref 150–400)
RBC: 3.84 MIL/uL — ABNORMAL LOW (ref 3.87–5.11)
RDW: 16.2 % — ABNORMAL HIGH (ref 11.5–15.5)
WBC: 10.3 10*3/uL (ref 4.0–10.5)
nRBC: 0 % (ref 0.0–0.2)

## 2023-06-09 NOTE — ED Triage Notes (Signed)
Patient reports right leg/foot  swelling with edema onset yesterday , denies injury , respirations unlabored /no fever or chills .

## 2023-06-10 ENCOUNTER — Encounter (HOSPITAL_COMMUNITY): Payer: Self-pay

## 2023-06-10 ENCOUNTER — Emergency Department (HOSPITAL_COMMUNITY): Payer: Medicare HMO

## 2023-06-10 DIAGNOSIS — Z7901 Long term (current) use of anticoagulants: Secondary | ICD-10-CM | POA: Diagnosis not present

## 2023-06-10 DIAGNOSIS — I452 Bifascicular block: Secondary | ICD-10-CM | POA: Diagnosis not present

## 2023-06-10 DIAGNOSIS — R918 Other nonspecific abnormal finding of lung field: Secondary | ICD-10-CM | POA: Diagnosis not present

## 2023-06-10 DIAGNOSIS — I442 Atrioventricular block, complete: Secondary | ICD-10-CM | POA: Diagnosis not present

## 2023-06-10 DIAGNOSIS — E46 Unspecified protein-calorie malnutrition: Secondary | ICD-10-CM | POA: Diagnosis not present

## 2023-06-10 DIAGNOSIS — M1990 Unspecified osteoarthritis, unspecified site: Secondary | ICD-10-CM | POA: Diagnosis not present

## 2023-06-10 DIAGNOSIS — Z888 Allergy status to other drugs, medicaments and biological substances status: Secondary | ICD-10-CM | POA: Diagnosis not present

## 2023-06-10 DIAGNOSIS — Z8249 Family history of ischemic heart disease and other diseases of the circulatory system: Secondary | ICD-10-CM | POA: Diagnosis not present

## 2023-06-10 DIAGNOSIS — N1832 Chronic kidney disease, stage 3b: Secondary | ICD-10-CM | POA: Diagnosis not present

## 2023-06-10 DIAGNOSIS — Z950 Presence of cardiac pacemaker: Secondary | ICD-10-CM | POA: Diagnosis not present

## 2023-06-10 DIAGNOSIS — I5033 Acute on chronic diastolic (congestive) heart failure: Secondary | ICD-10-CM | POA: Diagnosis not present

## 2023-06-10 DIAGNOSIS — N2581 Secondary hyperparathyroidism of renal origin: Secondary | ICD-10-CM | POA: Diagnosis not present

## 2023-06-10 DIAGNOSIS — Z6834 Body mass index (BMI) 34.0-34.9, adult: Secondary | ICD-10-CM | POA: Diagnosis not present

## 2023-06-10 DIAGNOSIS — R0602 Shortness of breath: Secondary | ICD-10-CM | POA: Diagnosis not present

## 2023-06-10 DIAGNOSIS — Z885 Allergy status to narcotic agent status: Secondary | ICD-10-CM | POA: Diagnosis not present

## 2023-06-10 DIAGNOSIS — Z79899 Other long term (current) drug therapy: Secondary | ICD-10-CM | POA: Diagnosis not present

## 2023-06-10 DIAGNOSIS — E669 Obesity, unspecified: Secondary | ICD-10-CM | POA: Diagnosis not present

## 2023-06-10 DIAGNOSIS — M79661 Pain in right lower leg: Secondary | ICD-10-CM

## 2023-06-10 DIAGNOSIS — R600 Localized edema: Secondary | ICD-10-CM | POA: Diagnosis not present

## 2023-06-10 DIAGNOSIS — I252 Old myocardial infarction: Secondary | ICD-10-CM | POA: Diagnosis not present

## 2023-06-10 DIAGNOSIS — I130 Hypertensive heart and chronic kidney disease with heart failure and stage 1 through stage 4 chronic kidney disease, or unspecified chronic kidney disease: Secondary | ICD-10-CM | POA: Diagnosis not present

## 2023-06-10 DIAGNOSIS — Z555 Less than a high school diploma: Secondary | ICD-10-CM | POA: Diagnosis not present

## 2023-06-10 DIAGNOSIS — G4733 Obstructive sleep apnea (adult) (pediatric): Secondary | ICD-10-CM | POA: Diagnosis not present

## 2023-06-10 DIAGNOSIS — J90 Pleural effusion, not elsewhere classified: Secondary | ICD-10-CM | POA: Diagnosis not present

## 2023-06-10 DIAGNOSIS — Z955 Presence of coronary angioplasty implant and graft: Secondary | ICD-10-CM | POA: Diagnosis not present

## 2023-06-10 DIAGNOSIS — I480 Paroxysmal atrial fibrillation: Secondary | ICD-10-CM | POA: Diagnosis not present

## 2023-06-10 DIAGNOSIS — M79674 Pain in right toe(s): Secondary | ICD-10-CM | POA: Diagnosis not present

## 2023-06-10 DIAGNOSIS — I517 Cardiomegaly: Secondary | ICD-10-CM | POA: Diagnosis not present

## 2023-06-10 DIAGNOSIS — Z91041 Radiographic dye allergy status: Secondary | ICD-10-CM | POA: Diagnosis not present

## 2023-06-10 DIAGNOSIS — I2510 Atherosclerotic heart disease of native coronary artery without angina pectoris: Secondary | ICD-10-CM | POA: Diagnosis not present

## 2023-06-10 DIAGNOSIS — E782 Mixed hyperlipidemia: Secondary | ICD-10-CM | POA: Diagnosis not present

## 2023-06-10 LAB — URINALYSIS, ROUTINE W REFLEX MICROSCOPIC
Bilirubin Urine: NEGATIVE
Glucose, UA: NEGATIVE mg/dL
Hgb urine dipstick: NEGATIVE
Ketones, ur: NEGATIVE mg/dL
Nitrite: NEGATIVE
Protein, ur: NEGATIVE mg/dL
Specific Gravity, Urine: 1.008 (ref 1.005–1.030)
pH: 7 (ref 5.0–8.0)

## 2023-06-10 LAB — PROTEIN / CREATININE RATIO, URINE
Creatinine, Urine: 35 mg/dL
Protein Creatinine Ratio: 0.2 mg/mg{creat} — ABNORMAL HIGH (ref 0.00–0.15)
Total Protein, Urine: 7 mg/dL

## 2023-06-10 LAB — BRAIN NATRIURETIC PEPTIDE: B Natriuretic Peptide: 70.4 pg/mL (ref 0.0–100.0)

## 2023-06-10 MED ORDER — FUROSEMIDE 10 MG/ML IJ SOLN
40.0000 mg | Freq: Once | INTRAMUSCULAR | Status: AC
  Administered 2023-06-10: 40 mg via INTRAVENOUS
  Filled 2023-06-10: qty 4

## 2023-06-10 MED ORDER — FUROSEMIDE 10 MG/ML IJ SOLN
20.0000 mg | Freq: Once | INTRAMUSCULAR | Status: AC
  Administered 2023-06-10: 20 mg via INTRAVENOUS
  Filled 2023-06-10: qty 2

## 2023-06-10 MED ORDER — ACETAMINOPHEN 325 MG PO TABS
650.0000 mg | ORAL_TABLET | Freq: Four times a day (QID) | ORAL | Status: DC | PRN
  Administered 2023-06-10 – 2023-06-11 (×2): 650 mg via ORAL
  Filled 2023-06-10 (×2): qty 2

## 2023-06-10 MED ORDER — HYDRALAZINE HCL 25 MG PO TABS
25.0000 mg | ORAL_TABLET | Freq: Two times a day (BID) | ORAL | Status: DC
  Administered 2023-06-10 – 2023-06-12 (×5): 25 mg via ORAL
  Filled 2023-06-10 (×5): qty 1

## 2023-06-10 MED ORDER — ACETAMINOPHEN 650 MG RE SUPP: 650.0000 mg | Freq: Four times a day (QID) | RECTAL | Status: DC | PRN

## 2023-06-10 MED ORDER — METOPROLOL SUCCINATE ER 25 MG PO TB24
12.5000 mg | ORAL_TABLET | Freq: Every day | ORAL | Status: DC
  Administered 2023-06-10 – 2023-06-12 (×3): 12.5 mg via ORAL
  Filled 2023-06-10 (×3): qty 1

## 2023-06-10 MED ORDER — ROSUVASTATIN CALCIUM 20 MG PO TABS
40.0000 mg | ORAL_TABLET | Freq: Every day | ORAL | Status: DC
  Administered 2023-06-10 – 2023-06-12 (×3): 40 mg via ORAL
  Filled 2023-06-10 (×3): qty 2

## 2023-06-10 MED ORDER — PANTOPRAZOLE SODIUM 40 MG PO TBEC
40.0000 mg | DELAYED_RELEASE_TABLET | Freq: Every day | ORAL | Status: DC
  Administered 2023-06-10 – 2023-06-12 (×3): 40 mg via ORAL
  Filled 2023-06-10 (×3): qty 1

## 2023-06-10 MED ORDER — ASPIRIN 81 MG PO TBEC
81.0000 mg | DELAYED_RELEASE_TABLET | Freq: Every day | ORAL | Status: DC
  Administered 2023-06-10 – 2023-06-12 (×3): 81 mg via ORAL
  Filled 2023-06-10 (×3): qty 1

## 2023-06-10 MED ORDER — RIVAROXABAN 15 MG PO TABS
15.0000 mg | ORAL_TABLET | Freq: Every day | ORAL | Status: DC
  Administered 2023-06-10 – 2023-06-11 (×2): 15 mg via ORAL
  Filled 2023-06-10 (×2): qty 1

## 2023-06-10 NOTE — ED Notes (Signed)
ED TO INPATIENT HANDOFF REPORT  ED Nurse Name and Phone #: Danahi Reddish  S Name/Age/Gender [name] 89 y.o. female Room/Bed: 039C/039C  Code Status   Code Status: Full Code  Home/SNF/Other Skilled nursing facility Patient oriented to: self, place, time, and situation Is this baseline? Yes   Triage Complete: Triage complete  Chief Complaint Edema of both legs [R60.0]  Triage Note Patient reports right leg/foot  swelling with edema onset yesterday , denies injury , respirations unlabored /no fever or chills .    Allergies Allergies  Allergen Reactions   Atorvastatin Nausea And Vomiting   Contrast Media [Iodinated Contrast Media] Other (See Comments)    Nausea/Vomiting and Shortness of Breath   Darvon [Propoxyphene] Nausea And Vomiting   Codeine Nausea And Vomiting and Anxiety    Level of Care/Admitting Diagnosis ED Disposition     ED Disposition  Admit   Condition  --   Comment  Hospital Area: [facility] [[id number]]  Level of Care: Telemetry Medical [[id number]]  May admit patient to [facility] or [facility] if equivalent level of care is available:: No  Covid Evaluation: Asymptomatic - no recent exposure (last 10 days) testing not required  Diagnosis: Edema of both legs [540981]  Admitting Physician: Ginnie Smart [[id number]]  Attending Physician: Ninetta Lights, JEFFREY C [[id number]]  Certification:: I certify this patient will need inpatient services for at least 2 midnights  Expected Medical Readiness: 06/13/2023          B Medical/Surgery History Past Medical History:  Diagnosis Date   Arthritis    CKD (chronic kidney disease), stage III (HCC)    Complete heart block (HCC) 11/16/2016   a. transient during NSTEMI, resolved with revascularization.   Coronary artery disease 11/2009   a. with prior stent placement to the ramus intermedius and RCA. b. NSTEMI 11/2016 s/p DES to DES to distal Cx with moderate residual dz.   CVA (cerebral vascular accident)  [facility])    Essential hypertension    Mixed hyperlipidemia    Non-ST elevation (NSTEMI) myocardial infarction (HCC) 11/16/2016   Obesity    Osteoarthritis    Reflux esophagitis    Sleep apnea 09/27/2010   Untreated, REM 64.7/hr AHI 18.5/hr RDI 19.2/hr. Patuent refused CPAP therapy   Past Surgical History:  Procedure Laterality Date   CHOLECYSTECTOMY     CORONARY ANGIOPLASTY WITH STENT PLACEMENT  2007   A 3.0x55mm CYPHER stent post dilated to 3.29 mm the 100% occlusion was reduced to 0%   CORONARY STENT INTERVENTION N/A 11/16/2016   Procedure: Coronary Stent Intervention;  Surgeon: Tonny Bollman, MD;  Location[facility] INVASIVE CV LAB;  Service: Cardiovascular;  Laterality: N/A;   LEFT HEART CATH AND CORONARY ANGIOGRAPHY N/A 11/16/2016   Procedure: Left Heart Cath and Coronary Angiography;  Surgeon: Tonny Bollman, MD;  Location: [facility] INVASIVE CV LAB;  Service: Cardiovascular;  Laterality: N/A;   LEFT HEART CATHETERIZATION WITH CORONARY ANGIOGRAM N/A 07/12/2014   Procedure: LEFT HEART CATHETERIZATION WITH CORONARY ANGIOGRAM;  Surgeon: Micheline Chapman, MD;  Location: [facility] CATH LAB;  Service: Cardiovascular;  Laterality: N/A;   TEMPORARY PACEMAKER N/A 11/16/2016   Procedure: Temporary Pacemaker;  Surgeon: Tonny Bollman, MD;  Location: [facility] INVASIVE CV LAB;  Service: Cardiovascular;  Laterality: N/A;     A IV Location/Drains/Wounds Patient Lines/Drains/Airways Status     Active Line/Drains/Airways     Name Placement date Placement time Site Days   Peripheral IV 06/10/23 20 G Anterior;Left Forearm 06/10/23  0644  Forearm  less than 1            Intake/Output Last 24 hours  Intake/Output Summary (Last 24 hours) at 06/10/2023 1302 Last data filed at 06/10/2023 1030 Gross per 24 hour  Intake --  Output 1200 ml  Net -1200 ml    Labs/Imaging Results for orders placed or performed during the hospital encounter of 06/09/23 (from the past 48 hour(s))  CBC with Differential     Status: Abnormal    Collection Time: 06/09/23 11:19 PM  Result Value Ref Range   WBC 10.3 4.0 - 10.5 K/uL   RBC 3.84 (L) 3.87 - 5.11 MIL/uL   Hemoglobin 11.1 (L) 12.0 - 15.0 g/dL   HCT 40.9 (L) 81.1 - 91.4 %   MCV 92.4 80.0 - 100.0 fL   MCH 28.9 26.0 - 34.0 pg   MCHC 31.3 30.0 - 36.0 g/dL   RDW 78.2 (H) 95.6 - 21.3 %   Platelets 278 150 - 400 K/uL   nRBC 0.0 0.0 - 0.2 %   Neutrophils Relative % 66 %   Neutro Abs 6.8 1.7 - 7.7 K/uL   Lymphocytes Relative 21 %   Lymphs Abs 2.2 0.7 - 4.0 K/uL   Monocytes Relative 12 %   Monocytes Absolute 1.2 (H) 0.1 - 1.0 K/uL   Eosinophils Relative 1 %   Eosinophils Absolute 0.1 0.0 - 0.5 K/uL   Basophils Relative 0 %   Basophils Absolute 0.0 0.0 - 0.1 K/uL   Immature Granulocytes 0 %   Abs Immature Granulocytes 0.03 0.00 - 0.07 K/uL    Comment: Performed at [facility], [address] [place], [place] [address]  Comprehensive metabolic panel     Status: Abnormal   Collection Time: 06/09/23 11:19 PM  Result Value Ref Range   Sodium 138 135 - 145 mmol/L   Potassium 3.7 3.5 - 5.1 mmol/L   Chloride 106 98 - 111 mmol/L   CO2 24 22 - 32 mmol/L   Glucose, Bld 131 (H) 70 - 99 mg/dL    Comment: Glucose reference range applies only to samples taken after fasting for at least 8 hours.   BUN 15 8 - 23 mg/dL   Creatinine, Ser 8.46 (H) 0.44 - 1.00 mg/dL   Calcium 8.2 (L) 8.9 - 10.3 mg/dL   Total Protein 5.7 (L) 6.5 - 8.1 g/dL   Albumin 2.4 (L) 3.5 - 5.0 g/dL   AST 24 15 - 41 U/L   ALT 16 0 - 44 U/L   Alkaline Phosphatase 62 38 - 126 U/L   Total Bilirubin 0.6 0.3 - 1.2 mg/dL   GFR, Estimated 42 (L) >60 mL/min    Comment: (NOTE) Calculated using the CKD-EPI Creatinine Equation (2021)    Anion gap 8 5 - 15    Comment: Performed at [facility], [address], [place], [place] [address]  Brain natriuretic peptide     Status: None   Collection Time: 06/09/23 11:19 PM  Result Value Ref Range   B Natriuretic Peptide 70.4 0.0 - 100.0 pg/mL    Comment:  Performed at [facility], [address] [place], [place] [address]   VAS [place] LOWER EXTREMITY VENOUS (DVT)  Result Date: 06/10/2023  Lower Venous DVT Study Patient Name:  [name]  Date of Exam:   06/10/2023 Medical Rec #: [id number]       Accession #:    [id number] Date of Birth: [birth year]  Patient Gender: F Patient Age:   50 years Exam Location:  [facility] Procedure:      VAS [place] LOWER EXTREMITY VENOUS (DVT) Referring Phys: DAN FLOYD --------------------------------------------------------------------------------  Indications: Pain.  Risk Factors: None identified. Limitations: Body habitus, poor ultrasound/tissue interface and patient pain tolerance. Comparison Study: No prior studies. Performing Technologist: Chanda Busing RVT  Examination Guidelines: A complete evaluation includes B-mode imaging, spectral Doppler, color Doppler, and power Doppler as needed of all accessible portions of each vessel. Bilateral testing is considered an integral part of a complete examination. Limited examinations for reoccurring indications may be performed as noted. The reflux portion of the exam is performed with the patient in reverse Trendelenburg.  +---------+---------------+---------+-----------+----------+-------------------+ RIGHT    CompressibilityPhasicitySpontaneityPropertiesThrombus Aging      +---------+---------------+---------+-----------+----------+-------------------+ CFV      Full           Yes      Yes                                      +---------+---------------+---------+-----------+----------+-------------------+ SFJ      Full                                                             +---------+---------------+---------+-----------+----------+-------------------+ FV Prox  Full                                                             +---------+---------------+---------+-----------+----------+-------------------+ FV Mid   Full                                                              +---------+---------------+---------+-----------+----------+-------------------+ FV Distal               Yes      Yes                                      +---------+---------------+---------+-----------+----------+-------------------+ PFV      Full                                                             +---------+---------------+---------+-----------+----------+-------------------+ POP                     Yes      Yes                                      +---------+---------------+---------+-----------+----------+-------------------+ PTV      Full                                                             +---------+---------------+---------+-----------+----------+-------------------+  PERO                                                  Not well visualized +---------+---------------+---------+-----------+----------+-------------------+   +----+---------------+---------+-----------+----------+--------------+ LEFTCompressibilityPhasicitySpontaneityPropertiesThrombus Aging +----+---------------+---------+-----------+----------+--------------+ CFV Full           Yes      Yes                                 +----+---------------+---------+-----------+----------+--------------+    Summary: RIGHT: - There is no evidence of deep vein thrombosis in the lower extremity. However, portions of this examination were limited- see technologist comments above.  - No cystic structure found in the popliteal fossa.  LEFT: - No evidence of common femoral vein obstruction.   *See table(s) above for measurements and observations.    Preliminary    DG Chest Port 1 View  Result Date: 06/10/2023 CLINICAL DATA:  Shortness of breath EXAM: PORTABLE CHEST 1 VIEW COMPARISON:  CXR 04/23/23 FINDINGS: Cardiomegaly. Small hiatal hernia. Small left pleural effusion. There is a superimposed hazy opacity at the left lung base that could represent  atelectasis or infection. No radiographically apparent displaced rib fractures. Visualized upper abdomen is unremarkable. Degenerative changes of the right glenohumeral AC joints. IMPRESSION: Small left pleural effusion with a superimposed airspace opacity at the left lung base that could represent atelectasis or infection. Electronically Signed   By: Lorenza Cambridge M.D.   On: 06/10/2023 08:05    Pending Labs Unresulted Labs (From admission, onward)     Start     Ordered   06/11/23 0500  Basic metabolic panel  Tomorrow morning,   R        06/10/23 1120   06/11/23 0500  CBC  Tomorrow morning,   R        06/10/23 1120            Vitals/Pain Today's Vitals   06/10/23 0730 06/10/23 0756 06/10/23 0925 06/10/23 1030  BP: (!) 170/95  (!) 169/91 (!) 134/110  Pulse: 84  84 (!) 101  Resp: (!) 21  16 19   Temp:  98.7 F (37.1 C)  99 F (37.2 C)  TempSrc:  Oral  Oral  SpO2: 100%  100% 100%  Weight:      Height:      PainSc:  9   8     Isolation Precautions No active isolations  Medications Medications  acetaminophen (TYLENOL) tablet 650 mg (has no administration in time range)    Or  acetaminophen (TYLENOL) suppository 650 mg (has no administration in time range)  aspirin EC tablet 81 mg (81 mg Oral Given 06/10/23 1245)  hydrALAZINE (APRESOLINE) tablet 25 mg (25 mg Oral Given 06/10/23 1244)  metoprolol succinate (TOPROL-XL) 24 hr tablet 12.5 mg (12.5 mg Oral Given 06/10/23 1245)  pantoprazole (PROTONIX) EC tablet 40 mg (40 mg Oral Given 06/10/23 1245)  Rivaroxaban (XARELTO) tablet 15 mg (has no administration in time range)  rosuvastatin (CRESTOR) tablet 40 mg (40 mg Oral Given 06/10/23 1245)  furosemide (LASIX) injection 40 mg (40 mg Intravenous Given 06/10/23 0644)    Mobility non-ambulatory     Focused Assessments Cardiac Assessment Handoff:  Cardiac Rhythm: Normal sinus rhythm Lab Results  Component Value Date   CKTOTAL 127 12/01/2011   CKMB 2.1 12/01/2011  TROPONINI  7.89 (HH) 11/18/2016   Lab Results  Component Value Date   DDIMER 0.48 12/01/2011   Does the Patient currently have chest pain? No    R Recommendations: See Admitting Provider Note  Report given to:   Additional Notes: n/a

## 2023-06-10 NOTE — Discharge Instructions (Addendum)
Patient Instructions:  You were seen for bilateral leg swelling and general weakness. After our workup, we treated you with diuresis with medications to help you get the extra fluid off. Cardiology was consulted and they made some recommendations to help ensure you continue to keep the fluid off once you leave the hospital.   You will now take your torsemide 20 mg pill every day, as well as a daily potassium supplement. Please weigh yourself when you get home (unclothed, dry), to get a sense of your dry weight now so you can monitor if you are gaining any additional weight.   The heart failure cardiology specialists would like to see you for a follow up appointment. Dr. Hughie Closs Christopher's office ([facility]) will call to help you set that up. Their office number is [phone].   You will be referred to physical and occupational therapy at [facility] to continue working on regaining your strength.   Please make an appointment with your PCP, Dr. Christel Mormon, within the next 2 weeks for a hospitalization follow-up visit. He will take some labs to help ensure your potassium levels and your kidney function are doing well. He will also look over your medications and make any adjustments as needed.   If you have a medical emergency, please call 911.   We are so glad you are feeling better. It was a pleasure to serve you while you were here. - Dr. Justin Mend and the Internal Medicine team

## 2023-06-10 NOTE — Progress Notes (Signed)
Right lower extremity venous duplex has been completed. Preliminary results can be found in CV Proc through chart review.   06/10/23 11:58 AM Olen Cordial RVT

## 2023-06-10 NOTE — ED Provider Notes (Addendum)
Received patient in turnover from Dr. Eudelia Bunch.  Please see their note for further details of Hx, PE.  Briefly patient is a 89 y.o. female with a Right Leg Swelling .  Sounds like this has been an ongoing issue for her.  She typically wakes up and it is a bit worse and she has some trouble getting around and sometimes improves with elevation.  She has had multiple changes to her diuretic recently.  Plan was to obtain a BNP and reassess the patient.  She still feels like her leg is a bit more swollen than typical but has had significant urine output here.  Discussed risk and benefits of admission and after some discussion we will have her try to follow-up with her family doctor in the office.  The family was worried about her mobility at home and we attempted to ambulate her down here unfortunately she was unable due to discomfort.  I feel she be very unstable and dangerous to go home.  I do agree with the initial diagnosis that this is most likely edema.  Likely will just need further diuresis.  I will order a DVT study.  Discussed with medicine for admission.   Melene Plan, DO 06/10/23 1030

## 2023-06-10 NOTE — H&P (Signed)
Date: 06/10/2023               Patient Name:  [name] MRN: [id number]  DOB: [birth year] Age / Sex: 89 y.o., female   PCP: [name], MD         Medical Service: Internal Medicine Teaching Service         Attending Physician: Dr. Ginnie Smart, MD    First Contact: Dr. Philomena Doheny Pager: [phone]  Second Contact: Dr. Gwenevere Abbot Pager: [phone]       After Hours (After 5p/  First Contact Pager: [phone]  weekends / holidays): Second Contact Pager: [phone]   Chief Complaint: leg pain and swelling  History of Present Illness: [name] is a 89 y.o. F with PMH CAD, NSTEMI s/p CAD, hx CVA, CKD stage IIIb, PAF, HLD, HTN who presented with bilateral LE pain and edema.  She explains that sometime last week she had progressive increase in her LE edema. The edema causes her pain and limits her ability to ambulate, which is of particular concern as she has 5 steps that she has to go up/down to get into/out of her home. Because of this she has not left her home in at least one week. She uses a walker to get around at home but does also have a wheelchair. She has had malaise and poor PO intake and stated this is "probably because [her] leg hurts." EDP attempted to ambulate her after she diuresed ~1.2L but she was unable. She has not had chest pain, shortness of breath, acute illness. Movement is limited by pain and "heaviness" of the limbs due to swelling.  Of note she has had recurrent LE edema over the last 1 year. She follows outpatient with cardiology and nephrology. Amlodipine and losartan were recently stopped. The last note I can see from nephrology (05/20/2023) has instructions for her to take torsemide daily until swelling improves, then she may taper dose. She had previously cut back from daily to every other day dosing because of urinary frequency due to the medication. She was last seen by cardiology 05/29/2023 and in their note there is discussion  about decreased stamina and patient stating she was considering repeat PT assessment.   IMTS paged for admission after patient unable to ambulate due to leg pain and swelling despite diuresis.  Meds:  Current Meds  Medication Sig   acetaminophen (TYLENOL) 500 MG tablet Take 2 tablets (1,000 mg total) by mouth every 6 (six) hours as needed for mild pain or moderate pain.   aspirin EC 81 MG tablet Take 1 tablet (81 mg total) by mouth daily. Swallow whole.   ergocalciferol (VITAMIN D2) 1.25 MG (50000 UT) capsule Take 50,000 Units by mouth every 30 (thirty) days.   hydrALAZINE (APRESOLINE) 25 MG tablet Take 1 tablet (25 mg total) by mouth in the morning and at bedtime.   Menthol, Topical Analgesic, (BIOFREEZE ROLL-ON) 4 % GEL Apply 1 application. topically daily as needed (pain).   Menthol, Topical Analgesic, (BIOFREEZE) 10 % CREA Apply 1 application  topically in the morning and at bedtime.   metoprolol succinate (TOPROL XL) 25 MG 24 hr tablet Take 0.5 tablets (12.5 mg total) by mouth daily.   nitroGLYCERIN (NITROSTAT) 0.4 MG SL tablet PLACE 1 TABLET UNDER THE TONGUE EVERY 5 MINUTES AS NEEDED.   pantoprazole (PROTONIX) 40 MG tablet TAKE 1 TABLET BY MOUTH EVERY DAY   Rivaroxaban (XARELTO) 15 MG TABS tablet Take 1  tablet (15 mg total) by mouth daily with supper.   rosuvastatin (CRESTOR) 40 MG tablet TAKE 1 TABLET BY MOUTH EVERY DAY   torsemide (DEMADEX) 20 MG tablet Take 20 mg by mouth every other day.   Allergies: Allergies as of 06/09/2023 - Review Complete 05/29/2023  Allergen Reaction Noted   Atorvastatin Nausea And Vomiting 11/16/2016   Contrast media [iodinated contrast media] Other (See Comments) 12/01/2011   Darvon [propoxyphene] Nausea And Vomiting 11/16/2016   Codeine Nausea And Vomiting and Anxiety 12/01/2011   Past Medical History: CAD s/p DES x2 in 2011 NSTEMI s/p DES x1 in 2018 CKD stage IIIb Paroxysmal atrial fibrillation Mixed hyperlipidemia HTN Hx CVA 08/2022 Secondary  hyperparathyroidism OA OSA Reflux espophagitis  Past Surgical History: Past Surgical History:  Procedure Laterality Date   CHOLECYSTECTOMY     CORONARY ANGIOPLASTY WITH STENT PLACEMENT  2007   A 3.0x30mm CYPHER stent post dilated to 3.29 mm the 100% occlusion was reduced to 0%   CORONARY STENT INTERVENTION N/A 11/16/2016   Procedure: Coronary Stent Intervention;  Surgeon: Tonny Bollman, MD;  Location[facility] INVASIVE CV LAB;  Service: Cardiovascular;  Laterality: N/A;   LEFT HEART CATH AND CORONARY ANGIOGRAPHY N/A 11/16/2016   Procedure: Left Heart Cath and Coronary Angiography;  Surgeon: Tonny Bollman, MD;  Location: [facility] INVASIVE CV LAB;  Service: Cardiovascular;  Laterality: N/A;   LEFT HEART CATHETERIZATION WITH CORONARY ANGIOGRAM N/A 07/12/2014   Procedure: LEFT HEART CATHETERIZATION WITH CORONARY ANGIOGRAM;  Surgeon: Micheline Chapman, MD;  Location: [facility] CATH LAB;  Service: Cardiovascular;  Laterality: N/A;   TEMPORARY PACEMAKER N/A 11/16/2016   Procedure: Temporary Pacemaker;  Surgeon: Tonny Bollman, MD;  Location: [facility] INVASIVE CV LAB;  Service: Cardiovascular;  Laterality: N/A;   Family History:  Family History  Problem Relation Age of Onset   Hypertension Mother    Aneurysm Mother        died at age 60   Other Father        unsure of medical history   Hypertension Other    Social History: Independent in ADLs and IADLs. Gets assistance from her children who live locally. Has multiple walkers and a wheelchair for ambulatory assistance. Denies alcohol, tobacco, illicit substance use. PCP is through [facility].  Review of Systems: A complete ROS was negative except as per HPI.   Physical Exam: Blood pressure (!) 134/110, pulse (!) 101, temperature 99 F (37.2 C), temperature source Oral, resp. rate 19, height 5\' 5"  (1.651 m), weight 92.8 kg, SpO2 100%. Constitutional:Appears stated age. In no acute distress. Cardio:Regular rate and rhythm. No murmurs, rubs, or  gallops. Pulm:Clear to auscultation bilaterally. Normal work of breathing on room air. Abdomen:Soft, nontender, nondistended. MSK/Skin:Bilateral 2+ pitting edema to knee. No increased warmth or erythema, no difference in size between extremities. Skin is warm and dry with feet being mildly cooler than more proximal aspect of extremities. Neuro:Alert and oriented x3. No focal deficit noted. Unable to fully assess LE strength as it is limited by pain per patient. Psych:Pleasant mood and affect.  EKG: NSR with PACs, RBB, LAFB, bifascicular block overall comparable to prior EKGs over last several months.  CXR:  1.Small left pleural effusion with a superimposed airspace opacity at the left lung base that could represent atelectasis or infection.  Assessment & Plan by Problem: Principal Problem:   Edema of both legs  Bilateral LE edema Hx HFpEF BNP WNL. HDS, no increased work of breathing or signs of acute heart failure exacerbation. I  suspect her current dosing of torsemide (every other day) is not sufficient to manage her peripheral edema. Family does explain that the dosing has been adjusted recently. Given bilateral nature and response to diuresis I do not suspect this is lymphedema and clinically I am not concerned for DVT. Has gotten IV lasix equivalent of doubled home torsemide dose already today. Plan: -Strict I's & O's, daily weights -Additonal lasix 20 mg IV this afternoon -F/u DVT study -F/u UA  CAD s/p DES x2 in 2011 NSTEMI s/p DES x1 in 2018 Mixed HLD Hx CVA 08/2022 Plan: -Continue home rosuvastatin 40 mg daily  CKD stage IIIb Secondary hyperparathyroidism Renal function stable at this time. Will need to keep close eye on this while we diurese. Plan: -Trend renal function  Paroxysmal atrial fibrillation Rate, rhythm controlled at this time. Plan: -Continue home xarelto 15 mg daily -Continue metoprolol 12.5 mg daily  HTN Mildly hypertensive. Suspect this is due to  her not having taken her medications since daytime yesterday.  Plan: -Resume home hydralazine 25 mg BID, metoprolol 12.5 mg daily  Physical deconditioning Per chart review this seems chronic in nature, acutely worsened due to bilateral LE edema. Plan: -PT/OT  Dispo: Admit patient to Inpatient with expected length of stay greater than 2 midnights.  Signed: Champ Mungo, DO 06/10/2023, 11:21 AM  After 5pm on weekdays and 1pm on weekends: On Call pager: [phone]

## 2023-06-10 NOTE — Evaluation (Addendum)
Physical Therapy Evaluation Patient Details Name: [name] MRN: [id number] DOB: Feb 17, 1934 Today's Date: 06/10/2023  History of Present Illness  89 y.o. Female who on 9/29 presented with bilateral LE pain and edema. Principle problem Bilateral LE edema with Hx HFpEF. PMHx: CAD, NSTEMI s/p CAD, hx CVA, CKD stage IIIb, PAF, HLD, HTN.  Clinical Impression  Pt admitted with above diagnosis. PTA pt ambulatory short distance with walker, needed assist with bathing, and navigating steps to enter/exit her home. She was going to physical therapy and making good progress on an outpatient basis. Per daughter, pt typically oriented without cognitive deficits, however pt unaware of year, stating 46 and 3035; but presently oriented otherwise. Seems to show poor insight into deficits. Currently patient required up to max assist for bed mobility, and heavy mod assist for sit to stand transfer with RW. Unable to pivot or lateral step along bed. Discussed findings with daughter and I believe she would benefit most from post-acute rehab <3hr/day. Pt very hopeful to return home but given significant functional deficits I think it is unlikely that she will progress rapidly enough to a level that family can safely care for her during this acute admission. She has 5-6 stairs to navigate in order to get into/out of her home. We will continue to monitor her progress and update recs as appropriate. Pt currently with functional limitations due to the deficits listed below (see PT Problem List). Pt will benefit from acute skilled PT to increase their independence and safety with mobility to allow discharge.           If plan is discharge home, recommend the following: Two people to help with walking and/or transfers;A lot of help with bathing/dressing/bathroom;Assistance with cooking/housework;Direct supervision/assist for medications management;Direct supervision/assist for financial management;Assist for  transportation;Help with stairs or ramp for entrance   Can travel by private vehicle   No    Equipment Recommendations None recommended by PT  Recommendations for Other Services       Functional Status Assessment Patient has had a recent decline in their functional status and demonstrates the ability to make significant improvements in function in a reasonable and predictable amount of time.     Precautions / Restrictions Precautions Precautions: Fall Restrictions Weight Bearing Restrictions: No      Mobility  Bed Mobility Overal bed mobility: Needs Assistance Bed Mobility: Rolling, Sidelying to Sit, Supine to Sit Rolling: Min assist, Used rails Sidelying to sit: Max assist Supine to sit: Mod assist     General bed mobility comments: Min assist to roll onto Lt side, CGA towards Rt with use of rail while changing bed pad. Max assist for trunk support and to facilitate LEs out of bed while rising. Mod assist for LEs back into bed. Heavy cues throughout for sequencing.    Transfers Overall transfer level: Needs assistance Equipment used: Rolling walker (2 wheels) Transfers: Sit to/from Stand Sit to Stand: Mod assist           General transfer comment: Heavy mod assist for boost to stand after several attempts. LEs weak, limited WB through RLE. Unable to side step or pivot with RW. Trunk heavily flexed. Limited tolerance with standing due to fatigue and weakness.    Ambulation/Gait               General Gait Details: unable at this time.  Stairs            Wheelchair Mobility     Tilt Bed  Modified Rankin (Stroke Patients Only)       Balance Overall balance assessment: Needs assistance Sitting-balance support: No upper extremity supported, Feet supported Sitting balance-Leahy Scale: Fair     Standing balance support: Bilateral upper extremity supported Standing balance-Leahy Scale: Poor                                Pertinent Vitals/Pain Pain Assessment Pain Assessment: Faces Faces Pain Scale: Hurts even more Pain Location: RLE and Rt shoulder Pain Descriptors / Indicators: Aching, Sore Pain Intervention(s): Monitored during session, Repositioned, Limited activity within patient's tolerance    Home Living Family/patient expects to be discharged to:: Private residence Living Arrangements: Children Available Help at Discharge: Family;Available PRN/intermittently Type of Home: House Home Access: Stairs to enter Entrance Stairs-Rails: Right;Left;Can reach both Entrance Stairs-Number of Steps: 5-6   Home Layout: One level Home Equipment: Rollator (4 wheels);Cane - single point;Shower seat;Grab bars - Chartered loss adjuster (2 wheels);BSC/3in1;Wheelchair - manual      Prior Function Prior Level of Function : Needs assist             Mobility Comments: uses RW or Rolator for mobility ADLs Comments: Dtr assist with all bathing, occasional assist with RUE dressing. Dtr drives pt to therapy     Extremity/Trunk Assessment   Upper Extremity Assessment Upper Extremity Assessment: Defer to OT evaluation    Lower Extremity Assessment Lower Extremity Assessment: Generalized weakness (waxy appearance of RLE compared to Lt)    Cervical / Trunk Assessment Cervical / Trunk Assessment: Normal  Communication   Communication Communication: No apparent difficulties Cueing Techniques: Verbal cues  Cognition Arousal: Alert Behavior During Therapy: WFL for tasks assessed/performed Overall Cognitive Status: Impaired/Different from baseline Area of Impairment: Orientation, Safety/judgement                 Orientation Level: Disoriented to, Time       Safety/Judgement: Decreased awareness of safety, Decreased awareness of deficits     General Comments: Poor insight, stating she could climb steps to enter home if daughter helps, however pt can barely get off bet with heavy  assistance from PT. Additionally pt stats year is 66, and 97. Daughter reports she is tyipcally oriented x4        General Comments General comments (skin integrity, edema, etc.): Spoke with Dtr on phone. Pt typically oriented x4 but seems a little off, especially with her inability to recall year.    Exercises     Assessment/Plan    PT Assessment Patient needs continued PT services  PT Problem List Decreased strength;Decreased range of motion;Decreased activity tolerance;Decreased balance;Decreased mobility;Decreased cognition;Decreased knowledge of use of DME;Decreased safety awareness;Decreased knowledge of precautions;Obesity;Pain       PT Treatment Interventions Gait training;DME instruction;Functional mobility training;Therapeutic activities;Stair training;Therapeutic exercise;Balance training;Neuromuscular re-education;Patient/family education;Wheelchair mobility training;Modalities;Cognitive remediation    PT Goals (Current goals can be found in the Care Plan section)  Acute Rehab PT Goals Patient Stated Goal: go home PT Goal Formulation: With patient/family Time For Goal Achievement: 06/24/23 Potential to Achieve Goals: Good    Frequency Min 1X/week     Co-evaluation               AM-PAC PT "6 Clicks" Mobility  Outcome Measure Help needed turning from your back to your side while in a flat bed without using bedrails?: A Little Help needed moving from lying on your back to sitting on the side of  a flat bed without using bedrails?: A Lot Help needed moving to and from a bed to a chair (including a wheelchair)?: A Lot Help needed standing up from a chair using your arms (e.g., wheelchair or bedside chair)?: A Lot Help needed to walk in hospital room?: Total Help needed climbing 3-5 steps with a railing? : Total 6 Click Score: 11    End of Session Equipment Utilized During Treatment: Gait belt Activity Tolerance: Patient limited by fatigue;Patient limited by  pain Patient left: in bed;with call bell/phone within reach;with bed alarm set Nurse Communication: Mobility status PT Visit Diagnosis: Unsteadiness on feet (R26.81);Muscle weakness (generalized) (M62.81);Difficulty in walking, not elsewhere classified (R26.2);Pain Pain - Right/Left: Right Pain - part of body: Leg    Time: 8413-2440 PT Time Calculation (min) (ACUTE ONLY): 43 min   Charges:   PT Evaluation $PT Eval Moderate Complexity: 1 Mod PT Treatments $Therapeutic Activity: 23-37 mins PT General Charges $$ ACUTE PT VISIT: 1 Visit         Kathlyn Sacramento, PT, DPT [facility]  Re[facility] Services Physical Therapist Office: [phone] Website: [email or web address]   Berton Mount 06/10/2023, 4:26 PM

## 2023-06-10 NOTE — ED Provider Notes (Signed)
[place] EMERGENCY DEPARTMENT AT [facility] Provider Note  CSN: [id number] Arrival date & time: 06/09/23 2234  Chief Complaint(s) Right Leg Swelling  HPI [name] is a 89 y.o. female with a past medical history listed below including prior NSTEMI status post stenting, diastolic heart failure, paroxysmal A-fib on Eliquis who presents to the emergency department with bilateral lower extremity edema causing discomfort.  Edema was notably worse yesterday. Patient reports that this pain intermittently ongoing for several months.  States that she recently had her torsemide decreased from daily to every other day.  Patient has tried compression stockings but states that they do not work.  Swelling does improve at times with appropriate elevation.  Reports intermittent dyspnea on exertion.  No orthopnea.  The history is provided by the patient.    Past Medical History Past Medical History:  Diagnosis Date   Arthritis    CKD (chronic kidney disease), stage III (HCC)    Complete heart block (HCC) 11/16/2016   a. transient during NSTEMI, resolved with revascularization.   Coronary artery disease 11/2009   a. with prior stent placement to the ramus intermedius and RCA. b. NSTEMI 11/2016 s/p DES to DES to distal Cx with moderate residual dz.   CVA (cerebral vascular accident) [facility])    Essential hypertension    Mixed hyperlipidemia    Non-ST elevation (NSTEMI) myocardial infarction (HCC) 11/16/2016   Obesity    Osteoarthritis    Reflux esophagitis    Sleep apnea 09/27/2010   Untreated, REM 64.7/hr AHI 18.5/hr RDI 19.2/hr. Patuent refused CPAP therapy   Patient Active Problem List   Diagnosis Date Noted   Cervical stenosis of spine 05/08/2023   Paroxysmal atrial fibrillation (HCC) 04/04/2023   Hypercoagulable state due to paroxysmal atrial fibrillation (HCC) 04/04/2023   Numbness and tingling of both feet 03/23/2023   Hypoalbuminemia due to protein-calorie malnutrition (HCC)  03/23/2023   Bilateral primary osteoarthritis of knee 11/01/2022   Encounter for general adult medical examination with abnormal findings 11/01/2022   Chronic kidney disease, stage 3b (HCC) 08/21/2022   Left pontine stroke (HCC) 11/06/2021   Dyslipidemia 11/06/2021   GERD without esophagitis 11/06/2021   Benign hypertensive kidney disease with chronic kidney disease 07/31/2019   Proteinuria 07/31/2019   Secondary hyperparathyroidism of renal origin (HCC) 07/31/2019   Stage 3b chronic kidney disease (HCC) 07/31/2019   CKD (chronic kidney disease), stage III (HCC) 11/23/2016   Pericardial effusion 11/23/2016   Complete heart block (HCC) 11/16/2016   Non-ST elevation (NSTEMI) myocardial infarction (HCC) 11/16/2016   Renal insufficiency 11/03/2015   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 Authorizing Provider  acetaminophen (TYLENOL) 500 MG tablet Take 2 tablets (1,000 mg total) by mouth every 6 (six) hours as needed for mild pain or moderate pain. 03/25/23  Yes Rolly Salter, MD  aspirin EC 81 MG tablet Take 1 tablet (81 mg total) by mouth daily. Swallow whole. 04/05/23  Yes Fenton, Clint R, PA  ergocalciferol (VITAMIN D2) 1.25 MG (  50000 UT) capsule Take 50,000 Units by mouth every 30 (thirty) days. 04/12/23  Yes [provider]  hydrALAZINE (APRESOLINE) 25 MG tablet Take 1 tablet (25 mg total) by mouth in the morning and at bedtime. 11/21/22 11/16/23 Yes Jonelle Sidle, MD  Menthol, Topical  Analgesic, (BIOFREEZE ROLL-ON) 4 % GEL Apply 1 application. topically daily as needed (pain).   Yes [provider]  Menthol, Topical Analgesic, (BIOFREEZE) 10 % CREA Apply 1 application  topically in the morning and at bedtime.   Yes [provider]  metoprolol succinate (TOPROL XL) 25 MG 24 hr tablet Take 0.5 tablets (12.5 mg total) by mouth daily. 02/11/23  Yes Sharlene Dory, NP  nitroGLYCERIN (NITROSTAT) 0.4 MG SL tablet PLACE 1 TABLET UNDER THE TONGUE EVERY 5 MINUTES AS NEEDED. 11/21/22  Yes Jonelle Sidle, MD  pantoprazole (PROTONIX) 40 MG tablet TAKE 1 TABLET BY MOUTH EVERY DAY 03/22/23  Yes Jonelle Sidle, MD  Rivaroxaban (XARELTO) 15 MG TABS tablet Take 1 tablet (15 mg total) by mouth daily with supper. 04/12/23  Yes Jonelle Sidle, MD  rosuvastatin (CRESTOR) 40 MG tablet TAKE 1 TABLET BY MOUTH EVERY DAY 05/31/23  Yes Jonelle Sidle, MD  torsemide (DEMADEX) 20 MG tablet Take 20 mg by mouth every other day. 04/22/23 04/21/24 Yes [provider]                                                                                                                                    Allergies Atorvastatin, Contrast media [iodinated contrast media], Darvon [propoxyphene], and Codeine  Review of Systems Review of Systems As noted in HPI  Physical Exam Vital Signs  I have reviewed the triage vital signs BP (!) 164/75 (BP Location: Left Arm)   Pulse 84   Temp 98.8 F (37.1 C) (Oral)   Resp 18   Ht 5\' 5"  (1.651 m)   Wt 92.8 kg   SpO2 100%   BMI 34.05 kg/m   Physical Exam Vitals reviewed.  Constitutional:      General: She is not in acute distress.    Appearance: She is well-developed. She is not diaphoretic.  HENT:     Head: Normocephalic and atraumatic.     Right Ear: External ear normal.     Left Ear: External ear normal.     Nose: Nose normal.  Eyes:     General: No scleral icterus.    Conjunctiva/sclera: Conjunctivae normal.  Neck:      Trachea: Phonation normal.  Cardiovascular:     Rate and Rhythm: Normal rate and regular rhythm.  Pulmonary:     Effort: Pulmonary effort is normal. No respiratory distress.     Breath sounds: No stridor.  Abdominal:     General: There is no distension.  Musculoskeletal:        General: Normal range of motion.     Cervical back: Normal range  of motion.     Right lower leg: 1+ Pitting Edema present.     Left lower leg: 1+ Pitting Edema present.  Neurological:     Mental Status: She is alert and oriented to person, place, and time.  Psychiatric:        Behavior: Behavior normal.     ED Results and Treatments Labs (all labs ordered are listed, but only abnormal results are displayed) Labs Reviewed  CBC WITH DIFFERENTIAL/PLATELET - Abnormal; Notable for the following components:      Result Value   RBC 3.84 (*)    Hemoglobin 11.1 (*)    HCT 35.5 (*)    RDW 16.2 (*)    Monocytes Absolute 1.2 (*)    All other components within normal limits  COMPREHENSIVE METABOLIC PANEL - Abnormal; Notable for the following components:   Glucose, Bld 131 (*)    Creatinine, Ser 1.24 (*)    Calcium 8.2 (*)    Total Protein 5.7 (*)    Albumin 2.4 (*)    GFR, Estimated 42 (*)    All other components within normal limits  BRAIN NATRIURETIC PEPTIDE                                                                                                                         EKG  EKG Interpretation Date/Time:  Sunday June 09 2023 23:19:38 EDT Ventricular Rate:  86 PR Interval:  178 QRS Duration:  112 QT Interval:  430 QTC Calculation: 514 R Axis:   -72  Text Interpretation: Sinus rhythm with Premature atrial complexes Low voltage QRS Right bundle branch block Left anterior fascicular block Bifascicular block Abnormal ECG When compared with ECG of 09-Jun-2023 23:18, PREVIOUS ECG IS PRESENT Confirmed by Drema Pry [phone]) on 06/10/2023 6:13:14 AM       Radiology No results  found.  Medications Ordered in ED Medications  furosemide (LASIX) injection 40 mg (40 mg Intravenous Given 06/10/23 0644)   Procedures Procedures  (including critical care time) Medical Decision Making / ED Course   Medical Decision Making Amount and/or Complexity of Data Reviewed Labs: ordered. Decision-making details documented in ED Course. Radiology: ordered and independent interpretation performed. Decision-making details documented in ED Course. ECG/medicine tests: ordered and independent interpretation performed. Decision-making details documented in ED Course.  Risk Prescription drug management. Decision regarding hospitalization.    BLE edema Intermittent DOE. Will check patient's renal function and determine ability for diuresis in the emergency department. Will rule out heart failure.  CBC without leukocytosis.  Mild anemia with stable hemoglobin. CMP without significant electrolyte derangement.  Mild renal sufficiency with stable renal function. BNP pending CXR pending  Will give 40mg  IV lasix and allow her to diurese while rest of work up is complete. Anticipate DC home with increase of Torsamide from 20mg  every other day to alternating 20mg  day 1 and 10 mg day 2 ...  Patient care turned over to oncoming provider. Patient case and results discussed in  detail; please see their note for further ED managment.        Final Clinical Impression(s) / ED Diagnoses Final diagnoses:  Peripheral edema    This chart was dictated using voice recognition software.  Despite best efforts to proofread,  errors can occur which can change the documentation meaning.    Nira Conn, MD 06/10/23 [phone]

## 2023-06-11 ENCOUNTER — Telehealth: Payer: Self-pay | Admitting: Internal Medicine

## 2023-06-11 ENCOUNTER — Inpatient Hospital Stay (HOSPITAL_COMMUNITY): Payer: Medicare HMO

## 2023-06-11 DIAGNOSIS — R509 Fever, unspecified: Secondary | ICD-10-CM

## 2023-06-11 DIAGNOSIS — I5033 Acute on chronic diastolic (congestive) heart failure: Secondary | ICD-10-CM | POA: Diagnosis not present

## 2023-06-11 DIAGNOSIS — R600 Localized edema: Secondary | ICD-10-CM | POA: Diagnosis not present

## 2023-06-11 DIAGNOSIS — M79674 Pain in right toe(s): Secondary | ICD-10-CM | POA: Diagnosis not present

## 2023-06-11 LAB — TROPONIN I (HIGH SENSITIVITY)
Troponin I (High Sensitivity): 50 ng/L — ABNORMAL HIGH (ref <18)
Troponin I (High Sensitivity): 45 ng/L — ABNORMAL HIGH (ref <18)

## 2023-06-11 LAB — CBC
HCT: 36 % (ref 36.0–46.0)
Hemoglobin: 11.4 g/dL — ABNORMAL LOW (ref 12.0–15.0)
MCH: 29.3 pg (ref 26.0–34.0)
MCHC: 31.7 g/dL (ref 30.0–36.0)
MCV: 92.5 fL (ref 80.0–100.0)
Platelets: 283 10*3/uL (ref 150–400)
RBC: 3.89 MIL/uL (ref 3.87–5.11)
RDW: 15.9 % — ABNORMAL HIGH (ref 11.5–15.5)
WBC: 8.3 10*3/uL (ref 4.0–10.5)
nRBC: 0 % (ref 0.0–0.2)

## 2023-06-11 LAB — BASIC METABOLIC PANEL
Anion gap: 12 (ref 5–15)
BUN: 16 mg/dL (ref 8–23)
CO2: 23 mmol/L (ref 22–32)
Calcium: 8.1 mg/dL — ABNORMAL LOW (ref 8.9–10.3)
Chloride: 99 mmol/L (ref 98–111)
Creatinine, Ser: 1.32 mg/dL — ABNORMAL HIGH (ref 0.44–1.00)
GFR, Estimated: 39 mL/min — ABNORMAL LOW (ref >60)
Glucose, Bld: 113 mg/dL — ABNORMAL HIGH (ref 70–99)
Potassium: 3.5 mmol/L (ref 3.5–5.1)
Sodium: 134 mmol/L — ABNORMAL LOW (ref 135–145)

## 2023-06-11 LAB — MAGNESIUM: Magnesium: 1.4 mg/dL — ABNORMAL LOW (ref 1.7–2.4)

## 2023-06-11 MED ORDER — MAGNESIUM SULFATE 4 GM/100ML IV SOLN
4.0000 g | Freq: Once | INTRAVENOUS | Status: AC
  Administered 2023-06-11: 4 g via INTRAVENOUS
  Filled 2023-06-11: qty 100

## 2023-06-11 MED ORDER — FUROSEMIDE 10 MG/ML IJ SOLN
40.0000 mg | Freq: Once | INTRAMUSCULAR | Status: AC
  Administered 2023-06-11: 40 mg via INTRAVENOUS
  Filled 2023-06-11: qty 4

## 2023-06-11 NOTE — Progress Notes (Signed)
OT Cancellation Note  Patient Details Name: FATIMA FEDIE MRN: [id number] DOB: 1934/01/14   Cancelled Treatment:    Reason Eval/Treat Not Completed: (P) Other (comment) (Cancelled session with Pt) Pt and daughter present upon entry. Pt and daughter expressed concerns about DC recommendations to postacute facility. Attempted to explain therapy recommendations and purpose of therapy, Pt and daughter stated adamantly that they wanted to return home with no follow up therapy and did not want to complete therapy while at hospital. Pt and daughter reiterated that they have all the support from family/friends at home needed to remain independent and felt that therapy was pressuring them. Reassured Pt/daughter that hospital staff is here to assist with any needs and preferences for returning home at Pt request through active listening and validating Pt needs. OT will hold off unless Pt requests further therapy.   Alexis Goodell 06/11/2023, 1:19 PM

## 2023-06-11 NOTE — Progress Notes (Signed)
Summary: [name] is a 89 yo with a history of HFpEF (LVEF 65-70% July 2024), NSTEMI 2018, CAD s/p stent placement, CVA (2023), CKD stage IIIb, pAfib, HLD, and HTN who presented with bilateral LE pain and edema and was admitted for an acute on chronic HF exacerbation. Temporary pacemaker was placed 05/14/2023 and was recently interrogated August 2024 with a 1.2% Atrial fibrillation burden.   Subjective:  No overnight events reported. Patient's two daughters are at bedside. Per patient, feeling better today, able to sleep flatter than previous nights. She denied any SOB, CP, heart palpitations, N/V, or abdominal pain. She believes her swelling, especially in the legs, is improved and her legs are less tender today. Shared updates with patient and her family.   Objective:  Vital signs in last 24 hours: Vitals:   06/10/23 1953 06/10/23 2142 06/11/23 0439 06/11/23 0500  BP: (!) 162/84 118/81 139/63   Pulse: (!) 111 (!) 113 88   Resp: 19 20 19    Temp: (!) 101.4 F (38.6 C) (!) 100.6 F (38.1 C) 97.8 F (36.6 C)   TempSrc:  Oral    SpO2: 100% 99% 100%   Weight:    88.2 kg  Height:          Latest Ref Rng & Units 06/11/2023    4:34 AM 06/09/2023   11:19 PM 04/23/2023    8:18 AM  CBC  WBC 4.0 - 10.5 K/uL 8.3  10.3  5.3   Hemoglobin 12.0 - 15.0 g/dL 09.8  11.9  14.7   Hematocrit 36.0 - 46.0 % 36.0  35.5  36.5   Platelets 150 - 400 K/uL 283  278  147        Latest Ref Rng & Units 06/11/2023    4:34 AM 06/09/2023   11:19 PM 04/23/2023    8:18 AM  BMP  Glucose 70 - 99 mg/dL 829  562  99   BUN 8 - 23 mg/dL 16  15  13    Creatinine 0.44 - 1.00 mg/dL 1.30  8.65  7.84   Sodium 135 - 145 mmol/L 134  138  137   Potassium 3.5 - 5.1 mmol/L 3.5  3.7  4.7   Chloride 98 - 111 mmol/L 99  106  108   CO2 22 - 32 mmol/L 23  24  22    Calcium 8.9 - 10.3 mg/dL 8.1  8.2  8.6     Magnesium 1.4  Troponin I pending  Physical Exam Constitutional: Patient is resting in bed in no acute distress.  She is speaking in full sentences. Of note, patient sometimes needed redirection to answer questions appropriately, may have difficulties with hearing.  CV: Regular rate and rhythm without murmurs on auscultation. 1+ bilateral lower extremity edema, non-tender. Bilateral pedal pulses intact, right toes cooler than left.  Pulmonary/Respiratory: Normal respiratory effort on room air, no coughing observed.  Abdominal: Soft, non-tender, non-distended; positive bowel sounds.  Skin: Warm and dry Psych: Normal mood and affect.   Assessment/Plan:  Principal Problem:   Edema of both legs Active Problems:   CKD stage 3b, GFR 30-44 ml/min (HCC)   Acute on chronic heart failure with preserved ejection fraction (HCC)  Bilateral LE edema Hx HFpEF Given IV lasix 20 mg and IV lasix 40 mg 9/30 with -2L output since admission. Reports improvement in symptoms, lower leg edema improved on physical exam. No tenderness reported today compared to yesterday (DVT negative). On touch, right foot/toes continue to be cooler compared  to the left, ordered ABIs. EKG NSR and patient without any chest pain on admission or today. Due to history of NSTEMI ordered troponin for completion, low concern for ischemia.    Patient on home torsemide 20 mg every other day (increased frequency if feeling overloaded), not taken consistently. Per patient and family, patient recently seen by cardiology (Dr. Diona Browner) 9/18, and at that time she reported being many pounds above her dry weight (89.7 kg at visit, 82.8 kg in July). Consulted with HF team to help determine additional diuresis dosing and PO diuretic plan for discharge. Appreciate their recommendations  Plan: -Strict I's & O's, daily weights -F/u ABIs -F/u cardiology reccs -F/u troponin I -Daily BMP, Mg, replete as needed   CAD s/p DES x2 in 2011 NSTEMI s/p DES x1 in 2018 Mixed HLD Hx CVA 08/2022 Plan: -Continue home rosuvastatin 40 mg daily   CKD stage IIIb Secondary  hyperparathyroidism Renal function stable at this time, Cr 1.32 today. Will monitory closely while diuresing patient.  Plan: -Trend renal function   Paroxysmal atrial fibrillation History of temporary pacemaker placement 2018, last interrogated August 2024. Rate, rhythm controlled since admission. Resumed home medications on admission.  Plan: -Continue home xarelto 15 mg daily -Continue metoprolol 12.5 mg daily   HTN Mildly hypertensive on admission. Per patient and family, does not take all medications consistently. Home meds last taken 9/29. Resumed home medications for BP here.  Plan: -Continue home hydralazine 25 mg BID, metoprolol 12.5 mg daily   Physical deconditioning Chronic, worsened by recent bilateral edema. Patient requires assistance from adult children at home. PT/OT evaluation recommending SNF. Will continue to work with her while she is here.  Plan: - Follow-up SNF placement with social work   Diet: HH Status: Full Code VTE prophylaxis: Rivaroxaban 15 mg   Prior to Admission Living Arrangement: home Anticipated Discharge Location: SNF Barriers to Discharge: medical management  Dispo: Anticipated discharge in approximately 1-2 day(s).   Philomena Doheny, MD, PGY-1 06/11/2023, 6:56 AM Pager: [phone] After 5pm on weekdays and 1pm on weekends: On Call pager [phone]

## 2023-06-11 NOTE — Consult Note (Addendum)
Patient Name:  [name]  Date of Exam:   06/10/2023 Medical Rec #: [id number]       Accession #:    [id number] Date of Birth: 25-Jan-1934       Patient Gender: F Patient Age:   89 years Exam Location:  [facility] Procedure:      VAS [place] LOWER EXTREMITY VENOUS (DVT) Referring Phys: DAN FLOYD --------------------------------------------------------------------------------  Indications: Pain.  Risk Factors: None identified. Limitations: Body habitus, poor ultrasound/tissue interface and patient pain tolerance. Comparison Study: No prior studies. Performing Technologist: Chanda Busing RVT  Examination Guidelines: A complete evaluation includes B-mode imaging, spectral Doppler, color Doppler, and power Doppler as needed of all accessible portions of each vessel. Bilateral testing is considered an integral part of a complete examination. Limited examinations for reoccurring  indications may be performed as noted. The reflux portion of the exam is performed with the patient in reverse Trendelenburg.  +---------+---------------+---------+-----------+----------+-------------------+ RIGHT    CompressibilityPhasicitySpontaneityPropertiesThrombus Aging      +---------+---------------+---------+-----------+----------+-------------------+ CFV      Full           Yes      Yes                                      +---------+---------------+---------+-----------+----------+-------------------+ SFJ      Full                                                             +---------+---------------+---------+-----------+----------+-------------------+ FV Prox  Full                                                             +---------+---------------+---------+-----------+----------+-------------------+ FV Mid   Full                                                             +---------+---------------+---------+-----------+----------+-------------------+ FV Distal               Yes      Yes                                      +---------+---------------+---------+-----------+----------+-------------------+ PFV      Full                                                             +---------+---------------+---------+-----------+----------+-------------------+ POP                     Yes      Yes                                      +---------+---------------+---------+-----------+----------+-------------------+  Cardiology Consultation   Patient ID: BLONNIE MASKE MRN: [id number]; DOB: [birth year]  Admit date: 06/09/2023 Date of Consult: 06/11/2023  PCP:  [name], MD   [facility] Providers Cardiologist:  Nona Dell, MD  Electrophysiologist:  Lewayne Bunting, MD       Patient Profile:   [name] is a 89 y.o. female with a hx of PAF on Xarelto, HFpEF (LVEF 65-70% July 2024), NSTEMI 2018, CAD s/p stent RI/RCA 2011, then CFX 2018, CVA (2023), CKD stage IIIb, HLD, and HTN, who is being seen 06/11/2023 for the evaluation of CHF at the request of Dr Ninetta Lights.  History of Present Illness:   Ms. Sulton was seen by Dr Diona Browner 09/18, wt 197 lbs (up 10 lbs in a month), +LE edema w/ decreased stamina, no med changes. ILR w/ 17 AF episodes, some w/ poor rate control, total AF burden 0.2%.  Pt admitted 09/30 with 1 week of worsening sx, wt 204 lbs. She got Lasix 20 mg and 40 mg IV in the ER, none today. Cards asked to see.   Ms. Sheperd does not feel that she overeats or over drinks.  Her daughter does the cooking and does not use salt.  She does not eat out a lot or eat junk food or prepared foods.  She has not been weighing daily, but can.  She has noticed increasing lower extremity edema, but denies orthopnea or PND.  Her activity level at baseline is poor, she cannot really say if it has decreased in the Last week or 2.  According to her daughter, the urine collection canisters were filled out 3 times in the ER.  Minimal intake is recorded, but she is net -2.3 L and is breathing much better.  Her lower extremity edema is improved.  Her daughter notes that the patient's base weight is about 184 pounds.  She is aware that the patient is put on 20 pounds since July, but says it is likely not all fluid.  She wakes up in the night every night, does not feel that she is waking up from shortness of breath.  She is able to sleep lying flat, no orthopnea.  No chest pain.   Past  Medical History:  Diagnosis Date   Arthritis    CKD (chronic kidney disease), stage III (HCC)    Complete heart block (HCC) 11/16/2016   a. transient during NSTEMI, resolved with revascularization.   Coronary artery disease 11/2009   a. with prior stent placement to the ramus intermedius and RCA. b. NSTEMI 11/2016 s/p DES to DES to distal Cx with moderate residual dz.   CVA (cerebral vascular accident) [facility])    Essential hypertension    Mixed hyperlipidemia    Non-ST elevation (NSTEMI) myocardial infarction (HCC) 11/16/2016   Obesity    Osteoarthritis    Reflux esophagitis    Sleep apnea 09/27/2010   Untreated, REM 64.7/hr AHI 18.5/hr RDI 19.2/hr. Patuent refused CPAP therapy    Past Surgical History:  Procedure Laterality Date   CHOLECYSTECTOMY     CORONARY ANGIOPLASTY WITH STENT PLACEMENT  2007   A 3.0x75mm CYPHER stent post dilated to 3.29 mm the 100% occlusion was reduced to 0%   CORONARY STENT INTERVENTION N/A 11/16/2016   Procedure: Coronary Stent Intervention;  Surgeon: Tonny Bollman, MD;  Location: [facility] INVASIVE CV LAB;  Service: Cardiovascular;  Laterality: N/A;   LEFT HEART CATH AND CORONARY ANGIOGRAPHY N/A 11/16/2016   Procedure: Left Heart Cath and Coronary  Cardiology Consultation   Patient ID: BLONNIE MASKE MRN: [id number]; DOB: [birth year]  Admit date: 06/09/2023 Date of Consult: 06/11/2023  PCP:  [name], MD   [facility] Providers Cardiologist:  Nona Dell, MD  Electrophysiologist:  Lewayne Bunting, MD       Patient Profile:   [name] is a 89 y.o. female with a hx of PAF on Xarelto, HFpEF (LVEF 65-70% July 2024), NSTEMI 2018, CAD s/p stent RI/RCA 2011, then CFX 2018, CVA (2023), CKD stage IIIb, HLD, and HTN, who is being seen 06/11/2023 for the evaluation of CHF at the request of Dr Ninetta Lights.  History of Present Illness:   Ms. Sulton was seen by Dr Diona Browner 09/18, wt 197 lbs (up 10 lbs in a month), +LE edema w/ decreased stamina, no med changes. ILR w/ 17 AF episodes, some w/ poor rate control, total AF burden 0.2%.  Pt admitted 09/30 with 1 week of worsening sx, wt 204 lbs. She got Lasix 20 mg and 40 mg IV in the ER, none today. Cards asked to see.   Ms. Sheperd does not feel that she overeats or over drinks.  Her daughter does the cooking and does not use salt.  She does not eat out a lot or eat junk food or prepared foods.  She has not been weighing daily, but can.  She has noticed increasing lower extremity edema, but denies orthopnea or PND.  Her activity level at baseline is poor, she cannot really say if it has decreased in the Last week or 2.  According to her daughter, the urine collection canisters were filled out 3 times in the ER.  Minimal intake is recorded, but she is net -2.3 L and is breathing much better.  Her lower extremity edema is improved.  Her daughter notes that the patient's base weight is about 184 pounds.  She is aware that the patient is put on 20 pounds since July, but says it is likely not all fluid.  She wakes up in the night every night, does not feel that she is waking up from shortness of breath.  She is able to sleep lying flat, no orthopnea.  No chest pain.   Past  Medical History:  Diagnosis Date   Arthritis    CKD (chronic kidney disease), stage III (HCC)    Complete heart block (HCC) 11/16/2016   a. transient during NSTEMI, resolved with revascularization.   Coronary artery disease 11/2009   a. with prior stent placement to the ramus intermedius and RCA. b. NSTEMI 11/2016 s/p DES to DES to distal Cx with moderate residual dz.   CVA (cerebral vascular accident) [facility])    Essential hypertension    Mixed hyperlipidemia    Non-ST elevation (NSTEMI) myocardial infarction (HCC) 11/16/2016   Obesity    Osteoarthritis    Reflux esophagitis    Sleep apnea 09/27/2010   Untreated, REM 64.7/hr AHI 18.5/hr RDI 19.2/hr. Patuent refused CPAP therapy    Past Surgical History:  Procedure Laterality Date   CHOLECYSTECTOMY     CORONARY ANGIOPLASTY WITH STENT PLACEMENT  2007   A 3.0x75mm CYPHER stent post dilated to 3.29 mm the 100% occlusion was reduced to 0%   CORONARY STENT INTERVENTION N/A 11/16/2016   Procedure: Coronary Stent Intervention;  Surgeon: Tonny Bollman, MD;  Location: [facility] INVASIVE CV LAB;  Service: Cardiovascular;  Laterality: N/A;   LEFT HEART CATH AND CORONARY ANGIOGRAPHY N/A 11/16/2016   Procedure: Left Heart Cath and Coronary  Cardiology Consultation   Patient ID: BLONNIE MASKE MRN: [id number]; DOB: [birth year]  Admit date: 06/09/2023 Date of Consult: 06/11/2023  PCP:  [name], MD   [facility] Providers Cardiologist:  Nona Dell, MD  Electrophysiologist:  Lewayne Bunting, MD       Patient Profile:   [name] is a 89 y.o. female with a hx of PAF on Xarelto, HFpEF (LVEF 65-70% July 2024), NSTEMI 2018, CAD s/p stent RI/RCA 2011, then CFX 2018, CVA (2023), CKD stage IIIb, HLD, and HTN, who is being seen 06/11/2023 for the evaluation of CHF at the request of Dr Ninetta Lights.  History of Present Illness:   Ms. Sulton was seen by Dr Diona Browner 09/18, wt 197 lbs (up 10 lbs in a month), +LE edema w/ decreased stamina, no med changes. ILR w/ 17 AF episodes, some w/ poor rate control, total AF burden 0.2%.  Pt admitted 09/30 with 1 week of worsening sx, wt 204 lbs. She got Lasix 20 mg and 40 mg IV in the ER, none today. Cards asked to see.   Ms. Sheperd does not feel that she overeats or over drinks.  Her daughter does the cooking and does not use salt.  She does not eat out a lot or eat junk food or prepared foods.  She has not been weighing daily, but can.  She has noticed increasing lower extremity edema, but denies orthopnea or PND.  Her activity level at baseline is poor, she cannot really say if it has decreased in the Last week or 2.  According to her daughter, the urine collection canisters were filled out 3 times in the ER.  Minimal intake is recorded, but she is net -2.3 L and is breathing much better.  Her lower extremity edema is improved.  Her daughter notes that the patient's base weight is about 184 pounds.  She is aware that the patient is put on 20 pounds since July, but says it is likely not all fluid.  She wakes up in the night every night, does not feel that she is waking up from shortness of breath.  She is able to sleep lying flat, no orthopnea.  No chest pain.   Past  Medical History:  Diagnosis Date   Arthritis    CKD (chronic kidney disease), stage III (HCC)    Complete heart block (HCC) 11/16/2016   a. transient during NSTEMI, resolved with revascularization.   Coronary artery disease 11/2009   a. with prior stent placement to the ramus intermedius and RCA. b. NSTEMI 11/2016 s/p DES to DES to distal Cx with moderate residual dz.   CVA (cerebral vascular accident) [facility])    Essential hypertension    Mixed hyperlipidemia    Non-ST elevation (NSTEMI) myocardial infarction (HCC) 11/16/2016   Obesity    Osteoarthritis    Reflux esophagitis    Sleep apnea 09/27/2010   Untreated, REM 64.7/hr AHI 18.5/hr RDI 19.2/hr. Patuent refused CPAP therapy    Past Surgical History:  Procedure Laterality Date   CHOLECYSTECTOMY     CORONARY ANGIOPLASTY WITH STENT PLACEMENT  2007   A 3.0x75mm CYPHER stent post dilated to 3.29 mm the 100% occlusion was reduced to 0%   CORONARY STENT INTERVENTION N/A 11/16/2016   Procedure: Coronary Stent Intervention;  Surgeon: Tonny Bollman, MD;  Location: [facility] INVASIVE CV LAB;  Service: Cardiovascular;  Laterality: N/A;   LEFT HEART CATH AND CORONARY ANGIOGRAPHY N/A 11/16/2016   Procedure: Left Heart Cath and Coronary  Patient Name:  [name]  Date of Exam:   06/10/2023 Medical Rec #: [id number]       Accession #:    [id number] Date of Birth: 25-Jan-1934       Patient Gender: F Patient Age:   89 years Exam Location:  [facility] Procedure:      VAS [place] LOWER EXTREMITY VENOUS (DVT) Referring Phys: DAN FLOYD --------------------------------------------------------------------------------  Indications: Pain.  Risk Factors: None identified. Limitations: Body habitus, poor ultrasound/tissue interface and patient pain tolerance. Comparison Study: No prior studies. Performing Technologist: Chanda Busing RVT  Examination Guidelines: A complete evaluation includes B-mode imaging, spectral Doppler, color Doppler, and power Doppler as needed of all accessible portions of each vessel. Bilateral testing is considered an integral part of a complete examination. Limited examinations for reoccurring  indications may be performed as noted. The reflux portion of the exam is performed with the patient in reverse Trendelenburg.  +---------+---------------+---------+-----------+----------+-------------------+ RIGHT    CompressibilityPhasicitySpontaneityPropertiesThrombus Aging      +---------+---------------+---------+-----------+----------+-------------------+ CFV      Full           Yes      Yes                                      +---------+---------------+---------+-----------+----------+-------------------+ SFJ      Full                                                             +---------+---------------+---------+-----------+----------+-------------------+ FV Prox  Full                                                             +---------+---------------+---------+-----------+----------+-------------------+ FV Mid   Full                                                             +---------+---------------+---------+-----------+----------+-------------------+ FV Distal               Yes      Yes                                      +---------+---------------+---------+-----------+----------+-------------------+ PFV      Full                                                             +---------+---------------+---------+-----------+----------+-------------------+ POP                     Yes      Yes                                      +---------+---------------+---------+-----------+----------+-------------------+  Patient Name:  [name]  Date of Exam:   06/10/2023 Medical Rec #: [id number]       Accession #:    [id number] Date of Birth: 25-Jan-1934       Patient Gender: F Patient Age:   89 years Exam Location:  [facility] Procedure:      VAS [place] LOWER EXTREMITY VENOUS (DVT) Referring Phys: DAN FLOYD --------------------------------------------------------------------------------  Indications: Pain.  Risk Factors: None identified. Limitations: Body habitus, poor ultrasound/tissue interface and patient pain tolerance. Comparison Study: No prior studies. Performing Technologist: Chanda Busing RVT  Examination Guidelines: A complete evaluation includes B-mode imaging, spectral Doppler, color Doppler, and power Doppler as needed of all accessible portions of each vessel. Bilateral testing is considered an integral part of a complete examination. Limited examinations for reoccurring  indications may be performed as noted. The reflux portion of the exam is performed with the patient in reverse Trendelenburg.  +---------+---------------+---------+-----------+----------+-------------------+ RIGHT    CompressibilityPhasicitySpontaneityPropertiesThrombus Aging      +---------+---------------+---------+-----------+----------+-------------------+ CFV      Full           Yes      Yes                                      +---------+---------------+---------+-----------+----------+-------------------+ SFJ      Full                                                             +---------+---------------+---------+-----------+----------+-------------------+ FV Prox  Full                                                             +---------+---------------+---------+-----------+----------+-------------------+ FV Mid   Full                                                             +---------+---------------+---------+-----------+----------+-------------------+ FV Distal               Yes      Yes                                      +---------+---------------+---------+-----------+----------+-------------------+ PFV      Full                                                             +---------+---------------+---------+-----------+----------+-------------------+ POP                     Yes      Yes                                      +---------+---------------+---------+-----------+----------+-------------------+  Cardiology Consultation   Patient ID: BLONNIE MASKE MRN: [id number]; DOB: [birth year]  Admit date: 06/09/2023 Date of Consult: 06/11/2023  PCP:  [name], MD   [facility] Providers Cardiologist:  Nona Dell, MD  Electrophysiologist:  Lewayne Bunting, MD       Patient Profile:   [name] is a 89 y.o. female with a hx of PAF on Xarelto, HFpEF (LVEF 65-70% July 2024), NSTEMI 2018, CAD s/p stent RI/RCA 2011, then CFX 2018, CVA (2023), CKD stage IIIb, HLD, and HTN, who is being seen 06/11/2023 for the evaluation of CHF at the request of Dr Ninetta Lights.  History of Present Illness:   Ms. Sulton was seen by Dr Diona Browner 09/18, wt 197 lbs (up 10 lbs in a month), +LE edema w/ decreased stamina, no med changes. ILR w/ 17 AF episodes, some w/ poor rate control, total AF burden 0.2%.  Pt admitted 09/30 with 1 week of worsening sx, wt 204 lbs. She got Lasix 20 mg and 40 mg IV in the ER, none today. Cards asked to see.   Ms. Sheperd does not feel that she overeats or over drinks.  Her daughter does the cooking and does not use salt.  She does not eat out a lot or eat junk food or prepared foods.  She has not been weighing daily, but can.  She has noticed increasing lower extremity edema, but denies orthopnea or PND.  Her activity level at baseline is poor, she cannot really say if it has decreased in the Last week or 2.  According to her daughter, the urine collection canisters were filled out 3 times in the ER.  Minimal intake is recorded, but she is net -2.3 L and is breathing much better.  Her lower extremity edema is improved.  Her daughter notes that the patient's base weight is about 184 pounds.  She is aware that the patient is put on 20 pounds since July, but says it is likely not all fluid.  She wakes up in the night every night, does not feel that she is waking up from shortness of breath.  She is able to sleep lying flat, no orthopnea.  No chest pain.   Past  Medical History:  Diagnosis Date   Arthritis    CKD (chronic kidney disease), stage III (HCC)    Complete heart block (HCC) 11/16/2016   a. transient during NSTEMI, resolved with revascularization.   Coronary artery disease 11/2009   a. with prior stent placement to the ramus intermedius and RCA. b. NSTEMI 11/2016 s/p DES to DES to distal Cx with moderate residual dz.   CVA (cerebral vascular accident) [facility])    Essential hypertension    Mixed hyperlipidemia    Non-ST elevation (NSTEMI) myocardial infarction (HCC) 11/16/2016   Obesity    Osteoarthritis    Reflux esophagitis    Sleep apnea 09/27/2010   Untreated, REM 64.7/hr AHI 18.5/hr RDI 19.2/hr. Patuent refused CPAP therapy    Past Surgical History:  Procedure Laterality Date   CHOLECYSTECTOMY     CORONARY ANGIOPLASTY WITH STENT PLACEMENT  2007   A 3.0x75mm CYPHER stent post dilated to 3.29 mm the 100% occlusion was reduced to 0%   CORONARY STENT INTERVENTION N/A 11/16/2016   Procedure: Coronary Stent Intervention;  Surgeon: Tonny Bollman, MD;  Location: [facility] INVASIVE CV LAB;  Service: Cardiovascular;  Laterality: N/A;   LEFT HEART CATH AND CORONARY ANGIOGRAPHY N/A 11/16/2016   Procedure: Left Heart Cath and Coronary

## 2023-06-11 NOTE — Telephone Encounter (Signed)
FMLA (daughter Jalani Cullifer)  Noted  Copied Sleeved  Original in PCP box Copy front desk folder

## 2023-06-12 ENCOUNTER — Telehealth: Payer: Self-pay | Admitting: Cardiology

## 2023-06-12 ENCOUNTER — Other Ambulatory Visit: Payer: Self-pay | Admitting: Cardiology

## 2023-06-12 ENCOUNTER — Other Ambulatory Visit (HOSPITAL_COMMUNITY): Payer: Self-pay

## 2023-06-12 DIAGNOSIS — I5033 Acute on chronic diastolic (congestive) heart failure: Secondary | ICD-10-CM | POA: Diagnosis not present

## 2023-06-12 DIAGNOSIS — R600 Localized edema: Secondary | ICD-10-CM | POA: Diagnosis not present

## 2023-06-12 LAB — BASIC METABOLIC PANEL
Anion gap: 10 (ref 5–15)
BUN: 18 mg/dL (ref 8–23)
CO2: 25 mmol/L (ref 22–32)
Calcium: 7.9 mg/dL — ABNORMAL LOW (ref 8.9–10.3)
Chloride: 100 mmol/L (ref 98–111)
Creatinine, Ser: 1.38 mg/dL — ABNORMAL HIGH (ref 0.44–1.00)
GFR, Estimated: 37 mL/min — ABNORMAL LOW (ref >60)
Glucose, Bld: 120 mg/dL — ABNORMAL HIGH (ref 70–99)
Potassium: 3.2 mmol/L — ABNORMAL LOW (ref 3.5–5.1)
Sodium: 135 mmol/L (ref 135–145)

## 2023-06-12 LAB — CBC
HCT: 32.1 % — ABNORMAL LOW (ref 36.0–46.0)
Hemoglobin: 10.7 g/dL — ABNORMAL LOW (ref 12.0–15.0)
MCH: 30.2 pg (ref 26.0–34.0)
MCHC: 33.3 g/dL (ref 30.0–36.0)
MCV: 90.7 fL (ref 80.0–100.0)
Platelets: 257 10*3/uL (ref 150–400)
RBC: 3.54 MIL/uL — ABNORMAL LOW (ref 3.87–5.11)
RDW: 15.8 % — ABNORMAL HIGH (ref 11.5–15.5)
WBC: 7.6 10*3/uL (ref 4.0–10.5)
nRBC: 0 % (ref 0.0–0.2)

## 2023-06-12 LAB — VAS US ABI WITH/WO TBI
Left ABI: 1.12
Right ABI: 1.22

## 2023-06-12 LAB — MAGNESIUM: Magnesium: 2 mg/dL (ref 1.7–2.4)

## 2023-06-12 MED ORDER — POTASSIUM CHLORIDE CRYS ER 20 MEQ PO TBCR
40.0000 meq | EXTENDED_RELEASE_TABLET | Freq: Two times a day (BID) | ORAL | Status: DC
  Administered 2023-06-12: 40 meq via ORAL
  Filled 2023-06-12: qty 2

## 2023-06-12 MED ORDER — POTASSIUM CHLORIDE 20 MEQ PO PACK
20.0000 meq | PACK | Freq: Every day | ORAL | 0 refills | Status: DC
  Filled 2023-06-12: qty 30, 30d supply, fill #0

## 2023-06-12 MED ORDER — POTASSIUM CHLORIDE 20 MEQ PO PACK: 20.0000 meq | PACK | Freq: Every day | ORAL | Status: DC

## 2023-06-12 MED ORDER — TORSEMIDE 20 MG PO TABS
20.0000 mg | ORAL_TABLET | Freq: Every day | ORAL | 0 refills | Status: DC
  Filled 2023-06-12: qty 30, 30d supply, fill #0

## 2023-06-12 MED ORDER — TORSEMIDE 20 MG PO TABS
20.0000 mg | ORAL_TABLET | Freq: Every day | ORAL | Status: DC
  Administered 2023-06-12: 20 mg via ORAL
  Filled 2023-06-12: qty 1

## 2023-06-12 NOTE — Progress Notes (Signed)
error

## 2023-06-12 NOTE — Telephone Encounter (Signed)
Transition of Care Follow-up Phone Call Request    Patient Name: [name] Date of Birth: 1934/06/30 Date of Encounter: 06/12/2023  Primary Care Provider:  [name], MD Primary Cardiologist:  Nona Dell, MD  [name] has been scheduled for a transition of care follow up appointment with a [facility] provider:  EPhilis Nettle 10/25. Repeat BMP in 1 week  Please reach out to [name] within 48 hours of discharge to confirm appointment and review transition of care protocol questionnaire. Anticipated discharge date: later today   [name] Kitchens, PA-C  06/12/2023, 10:14 AM

## 2023-06-12 NOTE — Discharge Summary (Signed)
Yes      Yes                                      +---------+---------------+---------+-----------+----------+-------------------+ PFV      Full                                                             +---------+---------------+---------+-----------+----------+-------------------+ POP                     Yes      Yes                                      +---------+---------------+---------+-----------+----------+-------------------+ PTV      Full                                                              +---------+---------------+---------+-----------+----------+-------------------+ PERO                                                  Not well visualized +---------+---------------+---------+-----------+----------+-------------------+   +----+---------------+---------+-----------+----------+--------------+ LEFTCompressibilityPhasicitySpontaneityPropertiesThrombus Aging +----+---------------+---------+-----------+----------+--------------+ CFV Full           Yes      Yes                                 +----+---------------+---------+-----------+----------+--------------+    Summary: RIGHT: - There is no evidence of deep vein thrombosis in the lower extremity. However, portions of this examination were limited- see technologist comments above.  - No cystic structure found in the popliteal fossa.  LEFT: - No evidence of common femoral vein obstruction.   *See table(s) above for measurements and observations. Electronically signed by Sherald Hess MD on 06/10/2023 at 1:45:50 PM.    Final    DG Chest Port 1 View  Result Date: 06/10/2023 CLINICAL DATA:  Shortness of breath EXAM: PORTABLE CHEST 1 VIEW COMPARISON:  CXR 04/23/23 FINDINGS: Cardiomegaly. Small hiatal hernia. Small left pleural effusion. There is a superimposed hazy opacity at the left lung base that could represent atelectasis or infection. No radiographically apparent displaced rib fractures. Visualized upper abdomen is unremarkable. Degenerative changes of the right glenohumeral AC joints. IMPRESSION: Small left pleural effusion with a superimposed airspace opacity at the left lung base that could represent atelectasis or infection. Electronically Signed   By: Lorenza Cambridge M.D.   On: 06/10/2023 08:05    LOWER EXTREMITY DOPPLER STUDY 06/11/2023  Patient Name:  [name]  Date of Exam:   06/10/2023  Medical Rec #: [id number]       Accession #:    [id number]  Date of Birth: 1934-07-02       Patient Gender: F  Patient Age:    11  113  131   BUN 8 - 23 mg/dL 18  16  15    Creatinine 0.44 - 1.00 mg/dL 1.61  0.96  0.45   Sodium 135 - 145 mmol/L 135  134  138   Potassium 3.5 - 5.1 mmol/L 3.2  3.5  3.7   Chloride 98 - 111 mmol/L 100  99  106   CO2 22 - 32 mmol/L 25  23  24    Calcium 8.9 - 10.3 mg/dL 7.9  8.1  8.2   Total Protein 6.5 - 8.1 g/dL   5.7   Total Bilirubin 0.3 - 1.2 mg/dL   0.6   Alkaline Phos 38 - 126 U/L   62   AST 15 - 41 U/L   24   ALT 0 - 44 U/L   16     VAS [place] LOWER EXTREMITY VENOUS (DVT)  Result Date: 06/10/2023  Lower Venous DVT Study Patient Name:  [name]  Date of Exam:   06/10/2023 Medical Rec #: [id number]       Accession #:    [id number] Date of Birth: 06-Aug-1934       Patient Gender: F Patient Age:   89 years Exam Location:  [facility] Procedure:      VAS [place] LOWER EXTREMITY VENOUS (DVT) Referring Phys: DAN FLOYD --------------------------------------------------------------------------------  Indications: Pain.  Risk Factors: None identified. Limitations: Body habitus, poor ultrasound/tissue interface and patient pain tolerance. Comparison Study: No prior studies. Performing Technologist: Chanda Busing RVT   Examination Guidelines: A complete evaluation includes B-mode imaging, spectral Doppler, color Doppler, and power Doppler as needed of all accessible portions of each vessel. Bilateral testing is considered an integral part of a complete examination. Limited examinations for reoccurring indications may be performed as noted. The reflux portion of the exam is performed with the patient in reverse Trendelenburg.  +---------+---------------+---------+-----------+----------+-------------------+ RIGHT    CompressibilityPhasicitySpontaneityPropertiesThrombus Aging      +---------+---------------+---------+-----------+----------+-------------------+ CFV      Full           Yes      Yes                                      +---------+---------------+---------+-----------+----------+-------------------+ SFJ      Full                                                             +---------+---------------+---------+-----------+----------+-------------------+ FV Prox  Full                                                             +---------+---------------+---------+-----------+----------+-------------------+ FV Mid   Full                                                             +---------+---------------+---------+-----------+----------+-------------------+ FV Distal  Yes      Yes                                      +---------+---------------+---------+-----------+----------+-------------------+ PFV      Full                                                             +---------+---------------+---------+-----------+----------+-------------------+ POP                     Yes      Yes                                      +---------+---------------+---------+-----------+----------+-------------------+ PTV      Full                                                              +---------+---------------+---------+-----------+----------+-------------------+ PERO                                                  Not well visualized +---------+---------------+---------+-----------+----------+-------------------+   +----+---------------+---------+-----------+----------+--------------+ LEFTCompressibilityPhasicitySpontaneityPropertiesThrombus Aging +----+---------------+---------+-----------+----------+--------------+ CFV Full           Yes      Yes                                 +----+---------------+---------+-----------+----------+--------------+    Summary: RIGHT: - There is no evidence of deep vein thrombosis in the lower extremity. However, portions of this examination were limited- see technologist comments above.  - No cystic structure found in the popliteal fossa.  LEFT: - No evidence of common femoral vein obstruction.   *See table(s) above for measurements and observations. Electronically signed by Sherald Hess MD on 06/10/2023 at 1:45:50 PM.    Final    DG Chest Port 1 View  Result Date: 06/10/2023 CLINICAL DATA:  Shortness of breath EXAM: PORTABLE CHEST 1 VIEW COMPARISON:  CXR 04/23/23 FINDINGS: Cardiomegaly. Small hiatal hernia. Small left pleural effusion. There is a superimposed hazy opacity at the left lung base that could represent atelectasis or infection. No radiographically apparent displaced rib fractures. Visualized upper abdomen is unremarkable. Degenerative changes of the right glenohumeral AC joints. IMPRESSION: Small left pleural effusion with a superimposed airspace opacity at the left lung base that could represent atelectasis or infection. Electronically Signed   By: Lorenza Cambridge M.D.   On: 06/10/2023 08:05    LOWER EXTREMITY DOPPLER STUDY 06/11/2023  Patient Name:  [name]  Date of Exam:   06/10/2023  Medical Rec #: [id number]       Accession #:    [id number]  Date of Birth: 1934-07-02       Patient Gender: F  Patient Age:    11  Yes      Yes                                      +---------+---------------+---------+-----------+----------+-------------------+ PFV      Full                                                             +---------+---------------+---------+-----------+----------+-------------------+ POP                     Yes      Yes                                      +---------+---------------+---------+-----------+----------+-------------------+ PTV      Full                                                              +---------+---------------+---------+-----------+----------+-------------------+ PERO                                                  Not well visualized +---------+---------------+---------+-----------+----------+-------------------+   +----+---------------+---------+-----------+----------+--------------+ LEFTCompressibilityPhasicitySpontaneityPropertiesThrombus Aging +----+---------------+---------+-----------+----------+--------------+ CFV Full           Yes      Yes                                 +----+---------------+---------+-----------+----------+--------------+    Summary: RIGHT: - There is no evidence of deep vein thrombosis in the lower extremity. However, portions of this examination were limited- see technologist comments above.  - No cystic structure found in the popliteal fossa.  LEFT: - No evidence of common femoral vein obstruction.   *See table(s) above for measurements and observations. Electronically signed by Sherald Hess MD on 06/10/2023 at 1:45:50 PM.    Final    DG Chest Port 1 View  Result Date: 06/10/2023 CLINICAL DATA:  Shortness of breath EXAM: PORTABLE CHEST 1 VIEW COMPARISON:  CXR 04/23/23 FINDINGS: Cardiomegaly. Small hiatal hernia. Small left pleural effusion. There is a superimposed hazy opacity at the left lung base that could represent atelectasis or infection. No radiographically apparent displaced rib fractures. Visualized upper abdomen is unremarkable. Degenerative changes of the right glenohumeral AC joints. IMPRESSION: Small left pleural effusion with a superimposed airspace opacity at the left lung base that could represent atelectasis or infection. Electronically Signed   By: Lorenza Cambridge M.D.   On: 06/10/2023 08:05    LOWER EXTREMITY DOPPLER STUDY 06/11/2023  Patient Name:  [name]  Date of Exam:   06/10/2023  Medical Rec #: [id number]       Accession #:    [id number]  Date of Birth: 1934-07-02       Patient Gender: F  Patient Age:    11  Name: CONSUELA WIDENER MRN: [id number] DOB: [birth year] 89 y.o. PCP: [name], MD  Date of Admission: 06/09/2023 10:45 PM Date of Discharge: 06/12/2023 7:25 PM Attending Physician: Dr.  Ninetta Lights  Discharge Diagnosis: Principal Problem:   Edema of both legs Active Problems:   CKD stage 3b, GFR 30-44 ml/min (HCC)   Acute on chronic heart failure with preserved ejection fraction (HCC)   Fever    Discharge Medications: Allergies as of 06/12/2023       Reactions   Atorvastatin Nausea And Vomiting   Contrast Media [iodinated Contrast Media] Other (See Comments)   Nausea/Vomiting and Shortness of Breath   Darvon [propoxyphene] Nausea And Vomiting   Codeine Nausea And Vomiting, Anxiety        Medication List     TAKE these medications    acetaminophen 500 MG tablet Commonly known as: TYLENOL Take 2 tablets (1,000 mg total) by mouth every 6 (six) hours as needed for mild pain or moderate pain.   aspirin EC 81 MG tablet Take 1 tablet (81 mg total) by mouth daily. Swallow whole.   Biofreeze Roll-On 4 % Gel Generic drug: Menthol (Topical Analgesic) Apply 1 application. topically daily as needed (pain).   Biofreeze 10 % Crea Generic drug: Menthol (Topical Analgesic) Apply 1 application  topically in the morning and at bedtime.   ergocalciferol 1.25 MG (50000 UT) capsule Commonly known as: VITAMIN D2 Take 50,000 Units by mouth every 30 (thirty) days.   hydrALAZINE 25 MG tablet Commonly known as: APRESOLINE Take 1 tablet (25 mg total) by mouth in the morning and at bedtime.   metoprolol succinate 25 MG 24 hr tablet Commonly known as: Toprol XL Take 0.5 tablets (12.5 mg total) by mouth daily.   nitroGLYCERIN 0.4 MG SL tablet Commonly known as: NITROSTAT PLACE 1 TABLET UNDER THE TONGUE EVERY 5 MINUTES AS NEEDED.   pantoprazole 40 MG tablet Commonly known as: PROTONIX TAKE 1 TABLET BY MOUTH EVERY DAY   potassium chloride 20 MEQ packet Commonly known as:  KLOR-CON Take 20 mEq by mouth daily. Start taking on: June 13, 2023   Rivaroxaban 15 MG Tabs tablet Commonly known as: Xarelto Take 1 tablet (15 mg total) by mouth daily with supper.   rosuvastatin 40 MG tablet Commonly known as: CRESTOR TAKE 1 TABLET BY MOUTH EVERY DAY   torsemide 20 MG tablet Commonly known as: DEMADEX Take 1 tablet (20 mg total) by mouth daily. Start taking on: June 13, 2023 What changed: when to take this        Disposition and follow-up:   Ms.Maycie Judie Petit Nagele was discharged from [facility] in Stable condition.  At the hospital follow up visit please address:  1.  Follow-up:  Diuresis regimen - Patient assessed by cardiology HF team, who recommended daily torsemide 20 mg. Patient instructed to get a dry weight when home, and to check weight regularly in order to assess additional need for diuresis. Patient also scheduled for a transition of care follow up appointment with a [facility] provider, E. Philis Nettle, NP on 10/25. Please ensure patient is taking torsemide, follows up with [facility] provider, and assess for volume status at clinic visit.   Hypokalemia - Patient to take daily potassium supplement while taking torsemide. Potassium was repleted during hospitalization. Please follow up with BMP and assess potassium and other electrolytes.   Home Equipment - PT recommended home equipment including hoyer lift and a hospital bed, however patient's daughter Tobi Bastos declined at  113  131   BUN 8 - 23 mg/dL 18  16  15    Creatinine 0.44 - 1.00 mg/dL 1.61  0.96  0.45   Sodium 135 - 145 mmol/L 135  134  138   Potassium 3.5 - 5.1 mmol/L 3.2  3.5  3.7   Chloride 98 - 111 mmol/L 100  99  106   CO2 22 - 32 mmol/L 25  23  24    Calcium 8.9 - 10.3 mg/dL 7.9  8.1  8.2   Total Protein 6.5 - 8.1 g/dL   5.7   Total Bilirubin 0.3 - 1.2 mg/dL   0.6   Alkaline Phos 38 - 126 U/L   62   AST 15 - 41 U/L   24   ALT 0 - 44 U/L   16     VAS [place] LOWER EXTREMITY VENOUS (DVT)  Result Date: 06/10/2023  Lower Venous DVT Study Patient Name:  [name]  Date of Exam:   06/10/2023 Medical Rec #: [id number]       Accession #:    [id number] Date of Birth: 06-Aug-1934       Patient Gender: F Patient Age:   89 years Exam Location:  [facility] Procedure:      VAS [place] LOWER EXTREMITY VENOUS (DVT) Referring Phys: DAN FLOYD --------------------------------------------------------------------------------  Indications: Pain.  Risk Factors: None identified. Limitations: Body habitus, poor ultrasound/tissue interface and patient pain tolerance. Comparison Study: No prior studies. Performing Technologist: Chanda Busing RVT   Examination Guidelines: A complete evaluation includes B-mode imaging, spectral Doppler, color Doppler, and power Doppler as needed of all accessible portions of each vessel. Bilateral testing is considered an integral part of a complete examination. Limited examinations for reoccurring indications may be performed as noted. The reflux portion of the exam is performed with the patient in reverse Trendelenburg.  +---------+---------------+---------+-----------+----------+-------------------+ RIGHT    CompressibilityPhasicitySpontaneityPropertiesThrombus Aging      +---------+---------------+---------+-----------+----------+-------------------+ CFV      Full           Yes      Yes                                      +---------+---------------+---------+-----------+----------+-------------------+ SFJ      Full                                                             +---------+---------------+---------+-----------+----------+-------------------+ FV Prox  Full                                                             +---------+---------------+---------+-----------+----------+-------------------+ FV Mid   Full                                                             +---------+---------------+---------+-----------+----------+-------------------+ FV Distal  113  131   BUN 8 - 23 mg/dL 18  16  15    Creatinine 0.44 - 1.00 mg/dL 1.61  0.96  0.45   Sodium 135 - 145 mmol/L 135  134  138   Potassium 3.5 - 5.1 mmol/L 3.2  3.5  3.7   Chloride 98 - 111 mmol/L 100  99  106   CO2 22 - 32 mmol/L 25  23  24    Calcium 8.9 - 10.3 mg/dL 7.9  8.1  8.2   Total Protein 6.5 - 8.1 g/dL   5.7   Total Bilirubin 0.3 - 1.2 mg/dL   0.6   Alkaline Phos 38 - 126 U/L   62   AST 15 - 41 U/L   24   ALT 0 - 44 U/L   16     VAS [place] LOWER EXTREMITY VENOUS (DVT)  Result Date: 06/10/2023  Lower Venous DVT Study Patient Name:  [name]  Date of Exam:   06/10/2023 Medical Rec #: [id number]       Accession #:    [id number] Date of Birth: 06-Aug-1934       Patient Gender: F Patient Age:   89 years Exam Location:  [facility] Procedure:      VAS [place] LOWER EXTREMITY VENOUS (DVT) Referring Phys: DAN FLOYD --------------------------------------------------------------------------------  Indications: Pain.  Risk Factors: None identified. Limitations: Body habitus, poor ultrasound/tissue interface and patient pain tolerance. Comparison Study: No prior studies. Performing Technologist: Chanda Busing RVT   Examination Guidelines: A complete evaluation includes B-mode imaging, spectral Doppler, color Doppler, and power Doppler as needed of all accessible portions of each vessel. Bilateral testing is considered an integral part of a complete examination. Limited examinations for reoccurring indications may be performed as noted. The reflux portion of the exam is performed with the patient in reverse Trendelenburg.  +---------+---------------+---------+-----------+----------+-------------------+ RIGHT    CompressibilityPhasicitySpontaneityPropertiesThrombus Aging      +---------+---------------+---------+-----------+----------+-------------------+ CFV      Full           Yes      Yes                                      +---------+---------------+---------+-----------+----------+-------------------+ SFJ      Full                                                             +---------+---------------+---------+-----------+----------+-------------------+ FV Prox  Full                                                             +---------+---------------+---------+-----------+----------+-------------------+ FV Mid   Full                                                             +---------+---------------+---------+-----------+----------+-------------------+ FV Distal

## 2023-06-12 NOTE — Progress Notes (Addendum)
Physical Therapy Treatment Patient Details Name: [name] MRN: [id number] DOB: 22-Apr-1934 Today's Date: 06/12/2023   History of Present Illness 89 y.o. Female who on 9/29 presented with bilateral LE pain and edema. Principle problem bilateral LE edema with Hx HFpEF. PMHx: CAD, NSTEMI s/p CAD, hx CVA, CKD stage IIIb, PAF, HLD, HTN.    PT Comments  Pt received in supine, agreeable to therapy session with emphasis on transfer, gait and stair negotiation. Pt with improved standing tolerance this date and able to initiate gait training at bedside and initiate stepping up onto 7" step. Pt quick to fatigue during stair trial, not able to perform the amount of stairs she will need to enter home. Pt performs functional mobility tasks with up to modA +2 and dense safety cues. Disposition and DME recommendations updated below per discussion with pt, daughter and supervising PT Logan B. Pt continues to benefit from PT services to progress toward functional mobility goals.     If plan is discharge home, recommend the following: Two people to help with walking and/or transfers;A lot of help with bathing/dressing/bathroom;Assistance with cooking/housework;Direct supervision/assist for medications management;Direct supervision/assist for financial management;Assist for transportation;Help with stairs or ramp for entrance;Supervision due to cognitive status   Can travel by private vehicle     No (PTAR)  Equipment Recommendations  Hoyer lift;Hospital bed    Recommendations for Other Services       Precautions / Restrictions Precautions Precautions: Fall Restrictions Weight Bearing Restrictions: No     Mobility  Bed Mobility Overal bed mobility: Needs Assistance Bed Mobility: Supine to Sit     Supine to sit: Min assist     General bed mobility comments: From flat bed to R EOB, HHA for trunk lifting. Increased time  to initiate and perform tasks.    Transfers Overall transfer level: Needs  assistance Equipment used: Rolling walker (2 wheels) Transfers: Sit to/from Stand, Bed to chair/wheelchair/BSC Sit to Stand: Mod assist, Min assist, +2 safety/equipment, From elevated surface           General transfer comment: Heavy mod assist for boost to stand from slightly elevated bed height, daughter and PTA assisting her to fully rise. Trunk heavily flexed. Pt had planned to walk to bathroom but then started immediately to turn and pivot to sit on rollator. Pt was reminded that she wanted to go to bathroom and pt able to stand upright with cues.    Ambulation/Gait Ambulation/Gait assistance: Min assist, +2 safety/equipment Gait Distance (Feet): 12 Feet (44ft, seated break on BSC, then 69ft) Assistive device: Rolling walker (2 wheels), Rollator (4 wheels) Gait Pattern/deviations: Step-to pattern, Trunk flexed, Shuffle, Decreased dorsiflexion - right, Decreased dorsiflexion - left, Decreased stride length, Wide base of support Gait velocity: Decreased, grossly <0.1 m/s     General Gait Details: Pt having difficulty keeping proximity to RW, maintains flexed posture throughout, moderate DOE and HR observed to be 111 bpm with exertional tasks. Decreased bilateral foot clearance and pt often resting forearms on AD. ~40ft with rollator to [facility] seated break, then ~75ft with RW. Pt attempting to sit prematurely prior to reaching proximity to chair/BSC.   Stairs Stairs: Yes Stairs assistance: Min assist, +2 physical assistance Stair Management: With walker, Forwards, Step to pattern (RW to simulate bilateral rails) Number of Stairs: 2 General stair comments: single 7" step x2 reps in room; HR max 111 bpm. Pt unable to perform additional steps due to c/o fatigue.   Wheelchair Mobility     Tilt  Bed    Modified Rankin (Stroke Patients Only)       Balance Overall balance assessment: Needs assistance Sitting-balance support: No upper extremity supported, Feet supported Sitting  balance-Leahy Scale: Fair Sitting balance - Comments: pt able lean forward to adjust socks without LOB   Standing balance support: Bilateral upper extremity supported Standing balance-Leahy Scale: Poor Standing balance comment: heavy reliance on BUE support, pt needing assist to pull up pants and for peri care after toileting.                            Cognition Arousal: Alert Behavior During Therapy: WFL for tasks assessed/performed, Impulsive Overall Cognitive Status: Within Functional Limits for tasks assessed Area of Impairment: Safety/judgement, Memory, Problem solving                 Orientation Level:  (oriented to self/location/situation, not otherwise assessed)   Memory: Decreased short-term memory, Decreased recall of precautions   Safety/Judgement: Decreased awareness of safety, Decreased awareness of deficits   Problem Solving: Requires verbal cues, Requires tactile cues, Difficulty sequencing General Comments: Pt cooperative but needs dense safety cues and impulsive to sit prior to reaching proximity to chair/BSC. Decreased insight into deficits.        Exercises      General Comments General comments (skin integrity, edema, etc.): SpO2 WFL on RA while standing      Pertinent Vitals/Pain Pain Assessment Pain Assessment: Faces Faces Pain Scale: Hurts a little bit Pain Location: RLE and Rt shoulder Pain Descriptors / Indicators: Aching, Sore Pain Intervention(s): Monitored during session, Repositioned, Limited activity within patient's tolerance     PT Goals (current goals can now be found in the care plan section) Acute Rehab PT Goals Patient Stated Goal: To go home with family assist. PT Goal Formulation: With patient/family Time For Goal Achievement: 06/24/23 Progress towards PT goals: Progressing toward goals    Frequency    Min 1X/week      PT Plan      Co-evaluation PT/OT/SLP Co-Evaluation/Treatment:  (dovetail/overlap  with OT session due to pt multidisciplinary therapy needs and ADL related questions)       AM-PAC PT "6 Clicks" Mobility   Outcome Measure  Help needed turning from your back to your side while in a flat bed without using bedrails?: A Little Help needed moving from lying on your back to sitting on the side of a flat bed without using bedrails?: A Little Help needed moving to and from a bed to a chair (including a wheelchair)?: A Lot Help needed standing up from a chair using your arms (e.g., wheelchair or bedside chair)?: A Lot Help needed to walk in hospital room?: A Lot (<39ft at a time today but could have if she had not done stairs) Help needed climbing 3-5 steps with a railing? : A Lot 6 Click Score: 14    End of Session Equipment Utilized During Treatment: Gait belt Activity Tolerance: Patient limited by fatigue;Patient tolerated treatment well Patient left: with call bell/phone within reach;in chair;with chair alarm set;with family/visitor present;Other (comment) (OT present for further instruction with pt/daughter) Nurse Communication: Mobility status PT Visit Diagnosis: Unsteadiness on feet (R26.81);Muscle weakness (generalized) (M62.81);Difficulty in walking, not elsewhere classified (R26.2);Pain Pain - Right/Left: Right Pain - part of body: Leg     Time: 1315-1350 PT Time Calculation (min) (ACUTE ONLY): 35 min  Charges:    $Gait Training: 8-22 mins $Therapeutic Activity: 8-22 mins PT  General Charges $$ ACUTE PT VISIT: 1 Visit                     Slayter Moorhouse P., PTA Acute Rehabilitation Services Secure Chat Preferred 9a-5:30pm Office: [phone]    Dorathy Kinsman [facility] 06/12/2023, 2:29 PM

## 2023-06-12 NOTE — Progress Notes (Addendum)
Patient Name: [name] Date of Encounter: 06/12/2023 [facility] Cardiologist: Nona Dell, MD   Interval Summary  .    Feeling good.  She has no complaints and really did not have any shortness of breath when she came in.  Vital Signs .    Vitals:   06/11/23 2124 06/12/23 0500 06/12/23 0607 06/12/23 0825  BP:   (!) 144/60 (!) 103/48  Pulse:   73 73  Resp:   19 17  Temp: 100 F (37.8 C)  98.9 F (37.2 C) 98.4 F (36.9 C)  TempSrc: Oral  Oral Oral  SpO2:   100% 100%  Weight:  88.3 kg    Height:        Intake/Output Summary (Last 24 hours) at 06/12/2023 0952 Last data filed at 06/12/2023 0825 Gross per 24 hour  Intake 360 ml  Output 900 ml  Net -540 ml      06/12/2023    5:00 AM 06/11/2023    5:00 AM 06/10/2023    1:00 PM  Last 3 Weights  Weight (lbs) 194 lb 10.7 oz 194 lb 7.1 oz 192 lb 7.4 oz  Weight (kg) 88.3 kg 88.2 kg 87.3 kg      Telemetry/ECG    Normal sinus rhythm heart rate in the 70s and 80s- Personally Reviewed  CV Studies     Physical Exam .   GEN: No acute distress.   Neck: Difficult to assess JVD Cardiac: RRR, no murmurs, rubs, or gallops.  Respiratory: Clear to auscultation bilaterally. GI: Soft, nontender, non-distended  MS: No edema  Patient Profile    Libby Judie Petit Boxley is a 89 y.o. female has hx of  PAF on Xarelto, HFpEF (LVEF 65-70% July 2024), NSTEMI 2018, CAD s/p stent RI/RCA 2011, then CFX 2018, CVA (2023), CKD stage IIIb, HLD, and HTN, CVA 2023,  and admitted on 06/11/2023 for the evaluation of CHF.  Assessment & Plan .     Acute on chronic HFpEF Overall looks to be euvolemic with no complaints.  She had reported some weight gain/swelling recently but no significant shortness of breath.  She is compliant with her medications Transition back to p.o. torsemide 20 mg, can continue this daily instead of every other day PTA.  Continue potassium supplementation for today and tomorrow. Then after that likely can be  maintained on daily. Will discuss with MD.  Reports frequent UTIs so therefore would not be a great candidate for SGLT2 inhibitor.  At follow-up as long as renal function stabilizes consider trying spironolactone as this will help with gentle diuresis and has heart failure benefit.  May be able to decrease her torsemide or hydralazine. Will need to recheck renal function in 1 week  CAD status post DES RI/RCA 2011, then CFX 2018 Appears to be stable disease without any anginal complaints.  Continue statin.  Currently on aspirin too however with Xarelto higher risk of bleeding.  Defer to MD whether or not this should be continued.  Paroxysmal atrial fibrillation Maintaining normal sinus rhythm.  Continue Xarelto 15mg  and Toprol XL 12.5mg .  Interrogation in August indicated 1.2% AF burden   CKD stage IIIb Creatinine today 1.38.  PTA was 1.18  Hypertension On hydralazine 25 mg twice daily, beta-blocker as above.  Decently controlled here.  Has follow up in 3 weeks.     For questions or updates, please contact [facility] Please consult [email or web address] for contact info under  Signed, Abagail Kitchens, PA-C

## 2023-06-12 NOTE — Progress Notes (Signed)
1615 patient going home with daughter alert x4 able to make all needs known. DC papers reviewed and follow up appoints medications filled at pharmacy prior to leaving hospital picking up on the way out

## 2023-06-12 NOTE — Telephone Encounter (Signed)
Patient daughter called needs ASAP to keep her job.  Asking to fax papers back ASAP.

## 2023-06-12 NOTE — TOC Transition Note (Signed)
Transition of Care [facility]) - CM/SW Discharge Note   Patient Details  Name: [name] MRN: [id number] Date of Birth: 05-30-1934  [facility]) CM/SW Contact:  Tom-Johnson, Hershal Coria, RN Phone Number: 06/12/2023, 1:18 PM   Clinical Narrative:      CM spoke with patient an daughter, [name] at bedside about discharge disposition.  SNF recommended, patient declined stating she is going home and resume Outpatient PT at [facility]. [name] states patient has all necessary DME's at home. Referral and info on AVS.   CM will continue to follow as patient progresses with care towards discharge.         Final next level of care: OP Rehab Barriers to Discharge: Barriers Resolved   Patient Goals and CMS Choice CMS Medicare.gov Compare Post Acute Care list provided to:: Patient Choice offered to / list presented to : Patient, Adult Children (Daughter, [name])  Discharge Placement                  Patient to be transferred to facility by: Daughter Name of family member notified: [name]    Discharge Plan and Services Additional resources added to the After Visit Summary for                  DME Arranged: N/A DME Agency: NA       HH Arranged: NA HH Agency: NA        Social Determinants of Health (SDOH) Interventions SDOH Screenings   Food Insecurity: No Food Insecurity (06/10/2023)  Housing: Low Risk  (06/10/2023)  Transportation Needs: No Transportation Needs (06/10/2023)  Utilities: Not At Risk (06/10/2023)  Depression (PHQ2-9): Low Risk  (05/03/2023)  Financial Resource Strain: Low Risk  (03/07/2023)  Physical Activity: Unknown (01/27/2023)  Social Connections: Unknown (01/27/2023)  Stress: No Stress Concern Present (01/27/2023)  Tobacco Use: Low Risk  (06/10/2023)     Readmission Risk Interventions    06/12/2023   10:22 AM 03/25/2023    3:24 PM 08/21/2022   12:03 PM  Readmission Risk Prevention Plan  Post Dischage Appt  Complete   Medication Screening   Complete   Transportation Screening Complete Complete Complete  PCP or Specialist Appt within 5-7 Days Complete    Home Care Screening Complete  Complete  Medication Review (RN CM) Referral to Pharmacy  Complete

## 2023-06-12 NOTE — Plan of Care (Addendum)
Patient alert x4 on room air able to make all needs known assist with ADLs needed at baseline patient daughters assist at home. Plan to go home with outpatient therapy. Problem: Education: Goal: Knowledge of disease or condition will improve Outcome: Progressing Goal: Knowledge of secondary prevention will improve (MUST DOCUMENT ALL) Outcome: Progressing Goal: Knowledge of patient specific risk factors will improve Loraine Leriche N/A or DELETE if not current risk factor) Outcome: Progressing   Problem: Ischemic Stroke/TIA Tissue Perfusion: Goal: Complications of ischemic stroke/TIA will be minimized Outcome: Progressing   Problem: Coping: Goal: Will verbalize positive feelings about self Outcome: Progressing Goal: Will identify appropriate support needs Outcome: Progressing   Problem: Health Behavior/Discharge Planning: Goal: Ability to manage health-related needs will improve Outcome: Progressing Goal: Goals will be collaboratively established with patient/family Outcome: Progressing   Problem: Self-Care: Goal: Ability to participate in self-care as condition permits will improve Outcome: Not Progressing Note: R/t patient needs help with ADL care 2 person assist daughter helps patient at home  Goal: Verbalization of feelings and concerns over difficulty with self-care will improve Outcome: Progressing Goal: Ability to communicate needs accurately will improve Outcome: Progressing   Problem: Nutrition: Goal: Risk of aspiration will decrease Outcome: Progressing Goal: Dietary intake will improve Outcome: Progressing   Problem: Education: Goal: Knowledge of General Education information will improve Description: Including pain rating scale, medication(s)/side effects and non-pharmacologic comfort measures Outcome: Progressing   Problem: Health Behavior/Discharge Planning: Goal: Ability to manage health-related needs will improve Outcome: Progressing   Problem: Clinical  Measurements: Goal: Ability to maintain clinical measurements within normal limits will improve Outcome: Progressing Goal: Will remain free from infection Outcome: Progressing Goal: Diagnostic test results will improve Outcome: Progressing Goal: Respiratory complications will improve Outcome: Progressing Goal: Cardiovascular complication will be avoided Outcome: Progressing   Problem: Activity: Goal: Risk for activity intolerance will decrease Outcome: Progressing   Problem: Nutrition: Goal: Adequate nutrition will be maintained Outcome: Progressing   Problem: Coping: Goal: Level of anxiety will decrease Outcome: Progressing   Problem: Elimination: Goal: Will not experience complications related to bowel motility Outcome: Progressing Goal: Will not experience complications related to urinary retention Outcome: Progressing   Problem: Pain Managment: Goal: General experience of comfort will improve Outcome: Progressing   Problem: Safety: Goal: Ability to remain free from injury will improve Outcome: Progressing   Problem: Skin Integrity: Goal: Risk for impaired skin integrity will decrease Outcome: Progressing

## 2023-06-12 NOTE — Evaluation (Addendum)
Occupational Therapy Evaluation Patient Details Name: [name] MRN: [id number] DOB: 1934/01/25 Today's Date: 06/12/2023   History of Present Illness 89 y.o. Female who on 9/29 presented with bilateral LE pain and edema. Principle problem bilateral LE edema with Hx HFpEF. PMHx: CAD, NSTEMI s/p CAD, hx CVA, CKD stage IIIb, PAF, HLD, HTN.   Clinical Impression   Pt admitted for above, presents as generally weak and requires Min A to CGA for STS from raised surfaces, anticipate more assist for lower surfaces. Pt has assist with ADLs at baseline, needs Mod A to setup for ADLs. Educated pt family on gait belt use, would benefit from continued education and trials of pt safety/care. OT continue to follow pt acutely to address deficits and help reduce caregiver burden by introducing compensatory ADL strategies prn. Patient would benefit from post acute Home OT services to help maximize functional independence in natural environment     If plan is discharge home, recommend the following: A little help with walking and/or transfers;A lot of help with bathing/dressing/bathroom;Assistance with cooking/housework;Supervision due to cognitive status    Functional Status Assessment  Patient has had a recent decline in their functional status and demonstrates the ability to make significant improvements in function in a reasonable and predictable amount of time.  Equipment Recommendations  Tub/shower bench;Hoyer lift;Other (comment) (issued gait belt)    Recommendations for Other Services       Precautions / Restrictions Precautions Precautions: Fall Restrictions Weight Bearing Restrictions: No      Mobility Bed Mobility               General bed mobility comments: Pt OOB upon OT arrival with PT    Transfers Overall transfer level: Needs assistance Equipment used: Rolling walker (2 wheels) Transfers: Sit to/from Stand, Bed to chair/wheelchair/BSC Sit to Stand: Min assist, +2  safety/equipment, From elevated surface, Contact guard assist           General transfer comment: Min A STS from [facility], CGA from recliner with cueing      Balance Overall balance assessment: Needs assistance Sitting-balance support: No upper extremity supported, Feet supported Sitting balance-Leahy Scale: Fair Sitting balance - Comments: in recliner, donning socks   Standing balance support: Bilateral upper extremity supported Standing balance-Leahy Scale: Poor Standing balance comment: heavy reliance on BUE support, pt needing assist to pull up pants and for peri care after toileting.                           ADL either performed or assessed with clinical judgement   ADL Overall ADL's : Needs assistance/impaired Eating/Feeding: Sitting;Set up   Grooming: Sitting;Supervision/safety;Set up;Wash/dry hands   Upper Body Bathing: Sitting;Set up   Lower Body Bathing: Sitting/lateral leans;Moderate assistance   Upper Body Dressing : Sitting;Minimal assistance   Lower Body Dressing: Sitting/lateral leans;Moderate assistance;Sit to/from stand Lower Body Dressing Details (indicate cue type and reason): to don socks and Mod A to pull up briefs while standing Toilet Transfer: Minimal assistance;+2 for safety/equipment;Rolling walker (2 wheels);BSC/3in1   Toileting- Clothing Manipulation and Hygiene: Sit to/from stand;Moderate assistance         General ADL Comments: Educated pt daughter on the use of gait belt and step by step sequence for improved STS. Cues such as: scoot forward, heels back, push off surfaces,chest up     Vision         Perception         Praxis  Pertinent Vitals/Pain Pain Assessment Pain Assessment: Faces Faces Pain Scale: Hurts a little bit Pain Location: RLE and Rt shoulder Pain Descriptors / Indicators: Aching, Sore Pain Intervention(s): Monitored during session, Repositioned     Extremity/Trunk Assessment Upper Extremity  Assessment Upper Extremity Assessment: Generalized weakness           Communication Communication Communication: No apparent difficulties Cueing Techniques: Verbal cues   Cognition Arousal: Alert Behavior During Therapy: WFL for tasks assessed/performed, Impulsive Overall Cognitive Status: Impaired/Different from baseline Area of Impairment: Memory, Safety/judgement, Problem solving                     Memory: Decreased short-term memory   Safety/Judgement: Decreased awareness of safety, Decreased awareness of deficits   Problem Solving: Requires verbal cues, Requires tactile cues, Difficulty sequencing General Comments: Pt needs dense safety cues, attempted to sit in recliner without proper positioning     General Comments  VSS on RA    Exercises     Shoulder Instructions      Home Living Family/patient expects to be discharged to:: Private residence Living Arrangements: Children Available Help at Discharge: Family;Available PRN/intermittently Type of Home: House Home Access: Stairs to enter [employer] of Steps: 5-6 Entrance Stairs-Rails: Right;Left;Can reach both Home Layout: One level     Bathroom Shower/Tub: Tub/shower unit;Sponge bathes at baseline   [employer]: Standard Bathroom Accessibility: Yes   Home Equipment: Rollator (4 wheels);Cane - single point;Shower seat;Grab bars - Chartered loss adjuster (2 wheels);BSC/3in1;Wheelchair - manual          Prior Functioning/Environment               Mobility Comments: uses RW or Rolator for mobility ADLs Comments: Dtr assist with all bathing, occasional assist with RUE dressing. Dtr drives pt to therapy        OT Problem List: Decreased strength;Impaired balance (sitting and/or standing)      OT Treatment/Interventions: Self-care/ADL training;Balance training;Therapeutic exercise;Therapeutic activities;Patient/family education;DME and/or AE instruction    OT  Goals(Current goals can be found in the care plan section) Acute Rehab OT Goals Patient Stated Goal: To go home OT Goal Formulation: With patient/family Time For Goal Achievement: 06/26/23 Potential to Achieve Goals: Good ADL Goals Pt Will Perform Grooming: standing;with contact guard assist Pt Will Perform Lower Body Bathing: sitting/lateral leans;with set-up;with supervision Pt Will Perform Upper Body Dressing: with set-up;with supervision;sitting Pt Will Transfer to Toilet: with contact guard assist;ambulating;bedside commode (with caregiver indepence) Additional ADL Goal #1: Pt family will demonstrate adequate use of gait belt to complete functional transfers with Pt to/from  OT Frequency: Min 1X/week    Co-evaluation              AM-PAC OT "6 Clicks" Daily Activity     Outcome Measure Help from another person eating meals?: A Little Help from another person taking care of personal grooming?: A Little Help from another person toileting, which includes using toliet, bedpan, or urinal?: A Little Help from another person bathing (including washing, rinsing, drying)?: A Lot Help from another person to put on and taking off regular upper body clothing?: A Little Help from another person to put on and taking off regular lower body clothing?: A Lot 6 Click Score: 16   End of Session Equipment Utilized During Treatment: Gait belt;Rolling walker (2 wheels) Nurse Communication: Mobility status  Activity Tolerance: Patient tolerated treatment well Patient left: with chair alarm set;with call bell/phone within reach;in chair  OT Visit Diagnosis: Unsteadiness on  feet (R26.81);Other abnormalities of gait and mobility (R26.89);Muscle weakness (generalized) (M62.81)                Time: 3329-5188 OT Time Calculation (min): 37 min Charges:  OT General Charges $OT Visit: 1 Visit OT Evaluation $OT Eval Moderate Complexity: 1 Mod OT Treatments $Therapeutic Activity: 8-22  mins  06/12/2023  AB, OTR/L  Acute Rehabilitation Services  Office: [phone]   Tristan Schroeder 06/12/2023, 4:22 PM

## 2023-06-13 ENCOUNTER — Telehealth: Payer: Self-pay

## 2023-06-13 DIAGNOSIS — I639 Cerebral infarction, unspecified: Secondary | ICD-10-CM

## 2023-06-13 NOTE — Telephone Encounter (Signed)
Papers completed and faxed

## 2023-06-13 NOTE — Consult Note (Signed)
Value-Based Care Institute  [facility] Inpatient Consult   06/13/2023  Nadea TEKILA CAILLOUET 05-23-1934 [id number]  [facility] [[facility]]  Accountable Care Organization [ACO] Patient: Monia Pouch Medicare   Primary Care Provider: [name], MD with [facility] Primary Care this provider is listed for the transition of care follow up appointments    [facility] Liaison screened the patient remotely at [facility]. Patient transitioned home 06/12/23 and for Outpatient rehab, patient had declined SNF recommendation. Spoke with patient and Tobi Bastos, daughter per patient's request.  HIPAA verified. Spoke about reason for follow up all for community care coordination.  They were accepting and generous.  Wanted to know more information regarding fluid retention and restrictions.    Tobi Bastos also spoke about and regarding post hospital follow up needs. Patient wanted to place a grievance about care incidence that upset them during the hospital stay.  Contacted the operator for the phone number for the Office of Patient Experience and gave the information to [place] as well.    The patient was screened for with noted medium high rising risk score for unplanned readmission risk 2 ED and 2 hospital admissions in 6 months.  The patient was assessed for potential [facility]) Care Management service needs for post hospital transition for care coordination. Review of patient's electronic medical record reveals patient is had declined SNF rehab recommendations for home with OP rehab services to resume.   Plan: [facility] Liaison will make a Referral request for community care coordination: care coordination follow up   Community Care Management/Population Health does not replace or interfere with any arrangements made by the Inpatient Transition of Care team.   For questions contact:   Charlesetta Shanks, RN, BSN, CCM [place]  [facility]  Liaison Direct Dial: [phone] or secure chat Website: [email or web address]

## 2023-06-14 ENCOUNTER — Encounter (HOSPITAL_COMMUNITY): Payer: Self-pay

## 2023-06-14 ENCOUNTER — Other Ambulatory Visit: Payer: Self-pay

## 2023-06-14 ENCOUNTER — Inpatient Hospital Stay (HOSPITAL_COMMUNITY)
Admission: EM | Admit: 2023-06-14 | Discharge: 2023-06-20 | DRG: 689 | Disposition: A | Discharge status: Skilled Nursing Facility | Payer: Medicare HMO | Attending: Internal Medicine | Admitting: Internal Medicine

## 2023-06-14 ENCOUNTER — Emergency Department (HOSPITAL_COMMUNITY): Payer: Medicare HMO

## 2023-06-14 DIAGNOSIS — N390 Urinary tract infection, site not specified: Secondary | ICD-10-CM | POA: Diagnosis not present

## 2023-06-14 DIAGNOSIS — G4700 Insomnia, unspecified: Secondary | ICD-10-CM | POA: Diagnosis present

## 2023-06-14 DIAGNOSIS — Z743 Need for continuous supervision: Secondary | ICD-10-CM | POA: Diagnosis not present

## 2023-06-14 DIAGNOSIS — I480 Paroxysmal atrial fibrillation: Secondary | ICD-10-CM | POA: Diagnosis present

## 2023-06-14 DIAGNOSIS — R531 Weakness: Principal | ICD-10-CM | POA: Diagnosis present

## 2023-06-14 DIAGNOSIS — K2100 Gastro-esophageal reflux disease with esophagitis, without bleeding: Secondary | ICD-10-CM | POA: Diagnosis present

## 2023-06-14 DIAGNOSIS — Z7901 Long term (current) use of anticoagulants: Secondary | ICD-10-CM

## 2023-06-14 DIAGNOSIS — R630 Anorexia: Secondary | ICD-10-CM | POA: Diagnosis present

## 2023-06-14 DIAGNOSIS — N2581 Secondary hyperparathyroidism of renal origin: Secondary | ICD-10-CM | POA: Diagnosis present

## 2023-06-14 DIAGNOSIS — K449 Diaphragmatic hernia without obstruction or gangrene: Secondary | ICD-10-CM | POA: Diagnosis not present

## 2023-06-14 DIAGNOSIS — N179 Acute kidney failure, unspecified: Secondary | ICD-10-CM | POA: Diagnosis not present

## 2023-06-14 DIAGNOSIS — I252 Old myocardial infarction: Secondary | ICD-10-CM

## 2023-06-14 DIAGNOSIS — I2510 Atherosclerotic heart disease of native coronary artery without angina pectoris: Secondary | ICD-10-CM | POA: Diagnosis present

## 2023-06-14 DIAGNOSIS — R918 Other nonspecific abnormal finding of lung field: Secondary | ICD-10-CM | POA: Diagnosis not present

## 2023-06-14 DIAGNOSIS — E782 Mixed hyperlipidemia: Secondary | ICD-10-CM | POA: Diagnosis present

## 2023-06-14 DIAGNOSIS — Z1152 Encounter for screening for COVID-19: Secondary | ICD-10-CM

## 2023-06-14 DIAGNOSIS — I5032 Chronic diastolic (congestive) heart failure: Secondary | ICD-10-CM | POA: Diagnosis present

## 2023-06-14 DIAGNOSIS — Z79899 Other long term (current) drug therapy: Secondary | ICD-10-CM

## 2023-06-14 DIAGNOSIS — N1832 Chronic kidney disease, stage 3b: Secondary | ICD-10-CM | POA: Diagnosis present

## 2023-06-14 DIAGNOSIS — E66811 Obesity, class 1: Secondary | ICD-10-CM | POA: Diagnosis present

## 2023-06-14 DIAGNOSIS — B9620 Unspecified Escherichia coli [E. coli] as the cause of diseases classified elsewhere: Secondary | ICD-10-CM | POA: Diagnosis present

## 2023-06-14 DIAGNOSIS — Z950 Presence of cardiac pacemaker: Secondary | ICD-10-CM

## 2023-06-14 DIAGNOSIS — Z9049 Acquired absence of other specified parts of digestive tract: Secondary | ICD-10-CM

## 2023-06-14 DIAGNOSIS — Z8249 Family history of ischemic heart disease and other diseases of the circulatory system: Secondary | ICD-10-CM

## 2023-06-14 DIAGNOSIS — I442 Atrioventricular block, complete: Secondary | ICD-10-CM | POA: Diagnosis present

## 2023-06-14 DIAGNOSIS — Z7982 Long term (current) use of aspirin: Secondary | ICD-10-CM

## 2023-06-14 DIAGNOSIS — L89152 Pressure ulcer of sacral region, stage 2: Secondary | ICD-10-CM | POA: Diagnosis present

## 2023-06-14 DIAGNOSIS — Z87440 Personal history of urinary (tract) infections: Secondary | ICD-10-CM

## 2023-06-14 DIAGNOSIS — G9341 Metabolic encephalopathy: Secondary | ICD-10-CM | POA: Diagnosis present

## 2023-06-14 DIAGNOSIS — Z888 Allergy status to other drugs, medicaments and biological substances status: Secondary | ICD-10-CM

## 2023-06-14 DIAGNOSIS — Z885 Allergy status to narcotic agent status: Secondary | ICD-10-CM

## 2023-06-14 DIAGNOSIS — Z713 Dietary counseling and surveillance: Secondary | ICD-10-CM

## 2023-06-14 DIAGNOSIS — Z555 Less than a high school diploma: Secondary | ICD-10-CM

## 2023-06-14 DIAGNOSIS — G4733 Obstructive sleep apnea (adult) (pediatric): Secondary | ICD-10-CM | POA: Diagnosis present

## 2023-06-14 DIAGNOSIS — Z955 Presence of coronary angioplasty implant and graft: Secondary | ICD-10-CM

## 2023-06-14 DIAGNOSIS — L8990 Pressure ulcer of unspecified site, unspecified stage: Secondary | ICD-10-CM | POA: Insufficient documentation

## 2023-06-14 DIAGNOSIS — I517 Cardiomegaly: Secondary | ICD-10-CM | POA: Diagnosis not present

## 2023-06-14 DIAGNOSIS — D631 Anemia in chronic kidney disease: Secondary | ICD-10-CM | POA: Diagnosis present

## 2023-06-14 DIAGNOSIS — I130 Hypertensive heart and chronic kidney disease with heart failure and stage 1 through stage 4 chronic kidney disease, or unspecified chronic kidney disease: Secondary | ICD-10-CM | POA: Diagnosis present

## 2023-06-14 DIAGNOSIS — Z1612 Extended spectrum beta lactamase (ESBL) resistance: Secondary | ICD-10-CM | POA: Diagnosis present

## 2023-06-14 DIAGNOSIS — Z6833 Body mass index (BMI) 33.0-33.9, adult: Secondary | ICD-10-CM

## 2023-06-14 DIAGNOSIS — Z751 Person awaiting admission to adequate facility elsewhere: Secondary | ICD-10-CM

## 2023-06-14 DIAGNOSIS — Z91041 Radiographic dye allergy status: Secondary | ICD-10-CM

## 2023-06-14 DIAGNOSIS — R5383 Other fatigue: Secondary | ICD-10-CM | POA: Diagnosis not present

## 2023-06-14 DIAGNOSIS — I10 Essential (primary) hypertension: Secondary | ICD-10-CM | POA: Diagnosis present

## 2023-06-14 DIAGNOSIS — Z8673 Personal history of transient ischemic attack (TIA), and cerebral infarction without residual deficits: Secondary | ICD-10-CM

## 2023-06-14 LAB — COMPREHENSIVE METABOLIC PANEL
ALT: 12 U/L (ref 0–44)
AST: 27 U/L (ref 15–41)
Albumin: 2.3 g/dL — ABNORMAL LOW (ref 3.5–5.0)
Alkaline Phosphatase: 64 U/L (ref 38–126)
Anion gap: 11 (ref 5–15)
BUN: 31 mg/dL — ABNORMAL HIGH (ref 8–23)
CO2: 25 mmol/L (ref 22–32)
Calcium: 8.3 mg/dL — ABNORMAL LOW (ref 8.9–10.3)
Chloride: 99 mmol/L (ref 98–111)
Creatinine, Ser: 1.83 mg/dL — ABNORMAL HIGH (ref 0.44–1.00)
GFR, Estimated: 27 mL/min — ABNORMAL LOW (ref >60)
Glucose, Bld: 117 mg/dL — ABNORMAL HIGH (ref 70–99)
Potassium: 3.8 mmol/L (ref 3.5–5.1)
Sodium: 135 mmol/L (ref 135–145)
Total Bilirubin: 0.5 mg/dL (ref 0.3–1.2)
Total Protein: 6 g/dL — ABNORMAL LOW (ref 6.5–8.1)

## 2023-06-14 LAB — CBC
HCT: 34.2 % — ABNORMAL LOW (ref 36.0–46.0)
Hemoglobin: 10.8 g/dL — ABNORMAL LOW (ref 12.0–15.0)
MCH: 28.9 pg (ref 26.0–34.0)
MCHC: 31.6 g/dL (ref 30.0–36.0)
MCV: 91.4 fL (ref 80.0–100.0)
Platelets: 307 10*3/uL (ref 150–400)
RBC: 3.74 MIL/uL — ABNORMAL LOW (ref 3.87–5.11)
RDW: 15.8 % — ABNORMAL HIGH (ref 11.5–15.5)
WBC: 9.5 10*3/uL (ref 4.0–10.5)
nRBC: 0 % (ref 0.0–0.2)

## 2023-06-14 LAB — URINALYSIS, ROUTINE W REFLEX MICROSCOPIC
Bilirubin Urine: NEGATIVE
Glucose, UA: NEGATIVE mg/dL
Ketones, ur: NEGATIVE mg/dL
Nitrite: NEGATIVE
Protein, ur: 30 mg/dL — AB
Specific Gravity, Urine: 1.008 (ref 1.005–1.030)
pH: 7 (ref 5.0–8.0)

## 2023-06-14 LAB — SARS CORONAVIRUS 2 BY RT PCR: SARS Coronavirus 2 by RT PCR: NEGATIVE

## 2023-06-14 LAB — PROTIME-INR
INR: 2.4 — ABNORMAL HIGH (ref 0.8–1.2)
Prothrombin Time: 26.3 s — ABNORMAL HIGH (ref 11.4–15.2)

## 2023-06-14 LAB — MAGNESIUM: Magnesium: 1.9 mg/dL (ref 1.7–2.4)

## 2023-06-14 LAB — TROPONIN I (HIGH SENSITIVITY)
Troponin I (High Sensitivity): 20 ng/L — ABNORMAL HIGH (ref <18)
Troponin I (High Sensitivity): 19 ng/L — ABNORMAL HIGH (ref <18)

## 2023-06-14 LAB — BRAIN NATRIURETIC PEPTIDE: B Natriuretic Peptide: 34 pg/mL (ref 0.0–100.0)

## 2023-06-14 MED ORDER — RIVAROXABAN 15 MG PO TABS
15.0000 mg | ORAL_TABLET | Freq: Every day | ORAL | Status: DC
  Administered 2023-06-14 – 2023-06-19 (×6): 15 mg via ORAL
  Filled 2023-06-14 (×6): qty 1

## 2023-06-14 MED ORDER — HYDRALAZINE HCL 25 MG PO TABS
25.0000 mg | ORAL_TABLET | Freq: Two times a day (BID) | ORAL | Status: DC
  Administered 2023-06-14 – 2023-06-20 (×13): 25 mg via ORAL
  Filled 2023-06-14 (×13): qty 1

## 2023-06-14 MED ORDER — ROSUVASTATIN CALCIUM 20 MG PO TABS
40.0000 mg | ORAL_TABLET | Freq: Every day | ORAL | Status: DC
  Administered 2023-06-14 – 2023-06-20 (×7): 40 mg via ORAL
  Filled 2023-06-14 (×7): qty 2

## 2023-06-14 MED ORDER — PANTOPRAZOLE SODIUM 40 MG PO TBEC
40.0000 mg | DELAYED_RELEASE_TABLET | Freq: Every day | ORAL | Status: DC
  Administered 2023-06-14 – 2023-06-20 (×7): 40 mg via ORAL
  Filled 2023-06-14 (×7): qty 1

## 2023-06-14 MED ORDER — TORSEMIDE 20 MG PO TABS
20.0000 mg | ORAL_TABLET | Freq: Every day | ORAL | Status: DC
  Administered 2023-06-15 – 2023-06-20 (×6): 20 mg via ORAL
  Filled 2023-06-14 (×6): qty 1

## 2023-06-14 MED ORDER — MUSCLE RUB 10-15 % EX CREA: TOPICAL_CREAM | Freq: Two times a day (BID) | CUTANEOUS | Status: DC | PRN

## 2023-06-14 MED ORDER — METOPROLOL SUCCINATE ER 25 MG PO TB24
12.5000 mg | ORAL_TABLET | Freq: Every day | ORAL | Status: DC
  Administered 2023-06-14 – 2023-06-20 (×7): 12.5 mg via ORAL
  Filled 2023-06-14 (×7): qty 1

## 2023-06-14 MED ORDER — POTASSIUM CHLORIDE 20 MEQ PO PACK
20.0000 meq | PACK | Freq: Every day | ORAL | Status: DC
  Administered 2023-06-14 – 2023-06-20 (×7): 20 meq via ORAL
  Filled 2023-06-14 (×7): qty 1

## 2023-06-14 MED ORDER — ASPIRIN 81 MG PO TBEC
81.0000 mg | DELAYED_RELEASE_TABLET | Freq: Every day | ORAL | Status: DC
  Administered 2023-06-14 – 2023-06-20 (×7): 81 mg via ORAL
  Filled 2023-06-14 (×7): qty 1

## 2023-06-14 MED ORDER — ACETAMINOPHEN 500 MG PO TABS
1000.0000 mg | ORAL_TABLET | Freq: Four times a day (QID) | ORAL | Status: DC | PRN
  Administered 2023-06-14 – 2023-06-15 (×2): 1000 mg via ORAL
  Filled 2023-06-14 (×2): qty 2

## 2023-06-14 MED ORDER — MENTHOL (TOPICAL ANALGESIC) 10 % EX CREA: 1.0000 "application " | TOPICAL_CREAM | Freq: Two times a day (BID) | CUTANEOUS | Status: DC | PRN

## 2023-06-14 NOTE — Progress Notes (Addendum)
VFIEP:[id number]  Addend @ 1:40 PM CSW has outreached to [facility] and [facility] with no response.

## 2023-06-14 NOTE — ED Notes (Signed)
Lab and Xray at bedside ° °

## 2023-06-14 NOTE — Hospital Course (Signed)
89 year old female with a history of HFpEF, coronary disease with NSTEMI, stroke, CKD stage III, paroxysmal atrial fibrillation, hypertension, hyperlipidemia, bilateral lower extremity edema presenting secondary to generalized weakness.  The patient was recently hospitalized at [facility] from 06/09/2023 to 06/12/2023 with acute on chronic HFpEF.  The patient was diuresed over 3 L and discharged home with torsemide 20 mg daily with instructions to follow-up with cardiology in [place].  PT evaluation during the hospitalization recommended SNF but family opted for the patient to go home.  The patient represents today because of generalized weakness with difficulty getting out of bed.  There is also some shortness of breath.  There was continued leg edema.

## 2023-06-14 NOTE — ED Provider Notes (Addendum)
  Physical Exam  BP (!) 133/57   Pulse 84   Temp 99.2 F (37.3 C) (Oral)   Resp 19   Ht 5\' 5"  (1.651 m)   Wt 90.5 kg   SpO2 97%   BMI 33.20 kg/m   Physical Exam  Procedures  Procedures  ED Course / MDM    Medical Decision Making Amount and/or Complexity of Data Reviewed Labs: ordered. Radiology: ordered.  Risk OTC drugs. Prescription drug management. Decision regarding hospitalization.   Patient was seen in the ER for worsening weakness.  Recent admission for CHF.  After discharge, patient having difficulty coping at home.  Family having difficulty managing patient.  I reviewed the patient's chart, noted that patient was recommended SNF but had declined.  Social work consult has been placed.  I still did consult medicine, they agree that we just need to hold a dose of diuretic for today and resume it tomorrow.  Patient does not have profound renal insufficiency.   I will add BMP for tomorrow morning as well. UA ordered and normal. PT and TOC consult ordered.     Derwood Kaplan, MD 06/14/23 1256

## 2023-06-14 NOTE — Progress Notes (Addendum)
Transition of Care [facility]) - Emergency Department Mini Assessment   Patient Details  Name: [name] MRN: [id number] Date of Birth: 1934/03/01  Transition of Care [facility]) CM/SW Contact:    Princella Ion, LCSW Phone Number: 06/14/2023, 10:45 AM   Clinical Narrative: Pt presented to ED with weakness and pain. PT is recommending SNF. CSW contacted pt's daughter, Tobi Bastos, who is interested in pt going to SNF for STR. Tobi Bastos prefers [facility], [place], or [facility]. CSW to fax out. Bed offers pending.    ED Mini Assessment: What brought you to the Emergency Department? : weakness/pain  Barriers to Discharge: SNF Pending bed offer, Insurance Authorization  Barrier interventions: SNF workup  Means of departure: Not know       Patient Contact and Communications Key Contact 1: Surabhi, Gadea (Daughter)  [phone]   Spoke with: Rayshell, Goecke (Daughter)  [phone] Contact Date: 06/14/23,   Contact time: 1039 Contact Phone Number: [phone] Call outcome: Family interested in STR at SNF.  Patient states their goals for this hospitalization and ongoing recovery are:: STR at [facility] CMS Medicare.gov Compare Post Acute Care list provided to:: Patient Represenative (must comment) Choice offered to / list presented to : Adult Children  Admission diagnosis:  Weakness Patient Active Problem List   Diagnosis Date Noted   Fever 06/11/2023   Edema of both legs 06/10/2023   Acute on chronic heart failure with preserved ejection fraction (HCC) 06/10/2023   Cervical stenosis of spine 05/08/2023   Paroxysmal atrial fibrillation (HCC) 04/04/2023   Hypercoagulable state due to paroxysmal atrial fibrillation (HCC) 04/04/2023   Numbness and tingling of both feet 03/23/2023   Hypoalbuminemia due to protein-calorie malnutrition (HCC) 03/23/2023   Bilateral primary osteoarthritis of knee 11/01/2022   Encounter for general adult medical examination with abnormal findings 11/01/2022   Chronic kidney disease,  stage 3b (HCC) 08/21/2022   Left pontine stroke (HCC) 11/06/2021   Dyslipidemia 11/06/2021   GERD without esophagitis 11/06/2021   Benign hypertensive kidney disease with chronic kidney disease 07/31/2019   Proteinuria 07/31/2019   Secondary hyperparathyroidism of renal origin (HCC) 07/31/2019   CKD stage 3b, GFR 30-44 ml/min (HCC) 07/31/2019   CKD (chronic kidney disease), stage III (HCC) 11/23/2016   Pericardial effusion 11/23/2016   Complete heart block (HCC) 11/16/2016   Non-ST elevation (NSTEMI) myocardial infarction (HCC) 11/16/2016   Renal insufficiency 11/03/2015   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 09/06/2015   Uncontrolled hypertension 08/31/2015   HTN (hypertension), malignant    Symptomatic bradycardia    Mixed hyperlipidemia    Epigastric fullness    Near syncope 08/28/2015   CAD S/P PCI    Essential hypertension 03/05/2013   Obesity (BMI 30-39.9) 03/05/2013   Hyperlipidemia with target LDL less than 70 03/05/2013   Obstructive sleep apnea 03/05/2013   PCP:  [name], MD Pharmacy:   [facility] [phone] - [place], [place] - [address] AT [facility] CENTER [address] [place] [address] Phone: [phone] Fax: [phone]  [facility] [address] [place] [address] Phone: [phone] Fax: [phone]

## 2023-06-14 NOTE — ED Provider Notes (Signed)
[facility]-EMERGENCY DEPT [facility] Emergency Department Provider Note MRN:  [id number]  Arrival date & time: 06/14/23     Chief Complaint   Weakness   History of Present Illness   [name] is a 89 y.o. year-old female with a history of CAD, CKD, CHF presenting to the ED with chief complaint of weakness.  Recent admission to the hospital was lower extremity edema, required diuresis.  Was doing better for a day or 2 but is now worse again.  Generalized weakness, not really able to get out of bed on her own.  Endorsing occasional shortness of breath, the legs are better than they were during the hospitalization but are still a bit swollen.  Review of Systems  A thorough review of systems was obtained and all systems are negative except as noted in the HPI and PMH.   Patient's Health History    Past Medical History:  Diagnosis Date   Arthritis    CKD (chronic kidney disease), stage III (HCC)    Complete heart block (HCC) 11/16/2016   a. transient during NSTEMI, resolved with revascularization.   Coronary artery disease 11/2009   a. with prior stent placement to the ramus intermedius and RCA. b. NSTEMI 11/2016 s/p DES to DES to distal Cx with moderate residual dz.   CVA (cerebral vascular accident) [facility])    Essential hypertension    Mixed hyperlipidemia    Non-ST elevation (NSTEMI) myocardial infarction (HCC) 11/16/2016   Obesity    Osteoarthritis    Reflux esophagitis    Sleep apnea 09/27/2010   Untreated, REM 64.7/hr AHI 18.5/hr RDI 19.2/hr. Patuent refused CPAP therapy    Past Surgical History:  Procedure Laterality Date   CHOLECYSTECTOMY     CORONARY ANGIOPLASTY WITH STENT PLACEMENT  2007   A 3.0x2mm CYPHER stent post dilated to 3.29 mm the 100% occlusion was reduced to 0%   CORONARY STENT INTERVENTION N/A 11/16/2016   Procedure: Coronary Stent Intervention;  Surgeon: Tonny Bollman, MD;  Location: [facility] INVASIVE CV LAB;  Service: Cardiovascular;  Laterality: N/A;    LEFT HEART CATH AND CORONARY ANGIOGRAPHY N/A 11/16/2016   Procedure: Left Heart Cath and Coronary Angiography;  Surgeon: Tonny Bollman, MD;  Location: [facility] INVASIVE CV LAB;  Service: Cardiovascular;  Laterality: N/A;   LEFT HEART CATHETERIZATION WITH CORONARY ANGIOGRAM N/A 07/12/2014   Procedure: LEFT HEART CATHETERIZATION WITH CORONARY ANGIOGRAM;  Surgeon: Micheline Chapman, MD;  Location: [facility] CATH LAB;  Service: Cardiovascular;  Laterality: N/A;   TEMPORARY PACEMAKER N/A 11/16/2016   Procedure: Temporary Pacemaker;  Surgeon: Tonny Bollman, MD;  Location: [facility] INVASIVE CV LAB;  Service: Cardiovascular;  Laterality: N/A;    Family History  Problem Relation Age of Onset   Hypertension Mother    Aneurysm Mother        died at age 68   Other Father        unsure of medical history   Hypertension Other     Social History   Socioeconomic History   Marital status: Single    Spouse name: Not on file   Number of children: 9   Years of education: 10th grade   Highest education level: 9th grade  Occupational History   Occupation: Retired  Tobacco Use   Smoking status: Never   Smokeless tobacco: Never  Vaping Use   Vaping status: Never Used  Substance and Sexual Activity   Alcohol use: No   Drug use: No   Sexual activity: Never  Other Topics Concern  Not on file  Social History Narrative   Lives alone (children/grandchildren come by daily - sometimes spend the night).   Right-handed.   Nine children (8 living).   Social Determinants of Health   Financial Resource Strain: Low Risk  (03/07/2023)   Overall Financial Resource Strain (CARDIA)    Difficulty of Paying Living Expenses: Not hard at all  Food Insecurity: No Food Insecurity (06/10/2023)   Hunger Vital Sign    Worried About Running Out of Food in the Last Year: Never true    Ran Out of Food in the Last Year: Never true  Transportation Needs: No Transportation Needs (06/10/2023)   PRAPARE - Administrator, Civil Service  (Medical): No    Lack of Transportation (Non-Medical): No  Physical Activity: Unknown (01/27/2023)   Exercise Vital Sign    Days of Exercise per Week: 1 day    Minutes of Exercise per Session: Not on file  Stress: No Stress Concern Present (01/27/2023)   [employer] of Occupational Health - Occupational Stress Questionnaire    Feeling of Stress : Only a little  Social Connections: Unknown (01/27/2023)   Social Connection and Isolation Panel [NHANES]    Frequency of Communication with Friends and Family: More than three times a week    Frequency of Social Gatherings with Friends and Family: Three times a week    Attends Religious Services: 1 to 4 times per year    Active Member of Clubs or Organizations: Not on file    Attends Club or Organization Meetings: Not on file    Marital Status: Never married  Intimate Partner Violence: Not At Risk (06/10/2023)   Humiliation, Afraid, Rape, and Kick questionnaire    Fear of Current or Ex-Partner: No    Emotionally Abused: No    Physically Abused: No    Sexually Abused: No     Physical Exam   Vitals:   06/14/23 0500 06/14/23 0600  BP: (!) 167/77 (!) 133/57  Pulse: 85 83  Resp: (!) 23 (!) 22  Temp:    SpO2: 95% 97%    CONSTITUTIONAL: Well-appearing, NAD NEURO/PSYCH:  Alert and oriented x 3, no focal deficits EYES:  eyes equal and reactive ENT/NECK:  no LAD, no JVD CARDIO: Regular rate, well-perfused, normal S1 and S2 PULM:  CTAB no wheezing or rhonchi GI/GU:  non-distended, non-tender MSK/SPINE:  No gross deformities, no edema SKIN:  no rash, atraumatic   *Additional and/or pertinent findings included in MDM below  Diagnostic and Interventional Summary    EKG Interpretation Date/Time:  Friday June 14 2023 04:34:51 EDT Ventricular Rate:  86 PR Interval:  186 QRS Duration:  123 QT Interval:  424 QTC Calculation: 508 R Axis:   -55  Text Interpretation: Sinus rhythm Atrial premature complex RBBB and LAFB Confirmed by  Kennis Carina [phone]) on 06/14/2023 6:03:33 AM       Labs Reviewed  CBC - Abnormal; Notable for the following components:      Result Value   RBC 3.74 (*)    Hemoglobin 10.8 (*)    HCT 34.2 (*)    RDW 15.8 (*)    All other components within normal limits  COMPREHENSIVE METABOLIC PANEL - Abnormal; Notable for the following components:   Glucose, Bld 117 (*)    BUN 31 (*)    Creatinine, Ser 1.83 (*)    Calcium 8.3 (*)    Total Protein 6.0 (*)    Albumin 2.3 (*)    GFR,  Estimated 27 (*)    All other components within normal limits  PROTIME-INR - Abnormal; Notable for the following components:   Prothrombin Time 26.3 (*)    INR 2.4 (*)    All other components within normal limits  TROPONIN I (HIGH SENSITIVITY) - Abnormal; Notable for the following components:   Troponin I (High Sensitivity) 20 (*)    All other components within normal limits  SARS CORONAVIRUS 2 BY RT PCR  BRAIN NATRIURETIC PEPTIDE  MAGNESIUM  URINALYSIS, ROUTINE W REFLEX MICROSCOPIC  TROPONIN I (HIGH SENSITIVITY)    DG Chest Port 1 View  Final Result      Medications - No data to display   Procedures  /  Critical Care Procedures  ED Course and Medical Decision Making  Initial Impression and Ddx Differential diagnosis includes continued fluid overloaded state question CHF versus CKD as the etiology.  Pneumonia, UTI, electrolyte disturbance, AKI all in consideration as well.  Legs do not appear infected, while hospitalized she had DVT study of the right leg which was negative.  Past medical/surgical history that increases complexity of ED encounter: CHF, CKD  Interpretation of Diagnostics I personally reviewed the EKG and my interpretation is as follows: Sinus rhythm without concerning features  Labs reveal worsening renal function, acute kidney injury, possibly overdiuresis.  Patient Reassessment and Ultimate Disposition/Management     Patient with continued profound weakness, cannot ambulate or  stand up on her own.  Given this and the acute kidney injury will request hospitalist admission.  Patient management required discussion with the following services or consulting groups:  Hospitalist Service  Complexity of Problems Addressed Acute illness or injury that poses threat of life of bodily function  Additional Data Reviewed and Analyzed Further history obtained from: Further history from spouse/family member  Additional Factors Impacting ED Encounter Risk Consideration of hospitalization  Elmer Sow. Pilar Plate, MD [facility] Health Emergency Medicine [facility] [email or web address]  Final Clinical Impressions(s) / ED Diagnoses     ICD-10-CM   1. Weakness  R53.1     2. AKI (acute kidney injury) (HCC)  N17.9       ED Discharge Orders     None        Discharge Instructions Discussed with and Provided to Patient:   Discharge Instructions   None      Sabas Sous, MD 06/14/23 [phone]

## 2023-06-14 NOTE — Evaluation (Signed)
Physical Therapy Evaluation Patient Details Name: [name] MRN: [id number] DOB: 12-25-1933 Today's Date: 06/14/2023  History of Present Illness  History of Present Illness              [name] is a 89 y.o. year-old female with a history of CAD, CKD, CHF presenting to the ED with chief complaint of weakness.     Recent admission to the hospital was lower extremity edema, required diuresis.  Was doing better for a day or 2 but is now worse again.  Generalized weakness, not really able to get out of bed on her own.  Endorsing occasional shortness of breath, the legs are better than they were during the hospitalization but are still a bit swollen.   Clinical Impression  Patient demonstrates slow labored movement for sitting up at bedside with c/o right shoulder pain when using RUE, very unsteady on feet with buckling of knees when standing with RW, unable to take steps due to weakness and fall risk.  Patient required Mod/max assist to reposition when put back to bed.  Patient will benefit from continued skilled physical therapy in hospital and recommended venue below to increase strength, balance, endurance for safe ADLs and gait.          If plan is discharge home, recommend the following: A lot of help with walking and/or transfers;A lot of help with bathing/dressing/bathroom;Assistance with cooking/housework;Help with stairs or ramp for entrance   Can travel by private vehicle   No    Equipment Recommendations None recommended by PT  Recommendations for Other Services       Functional Status Assessment Patient has had a recent decline in their functional status and demonstrates the ability to make significant improvements in function in a reasonable and predictable amount of time.     Precautions / Restrictions Precautions Precautions: Fall Restrictions Weight Bearing Restrictions: No      Mobility  Bed Mobility Overal bed mobility: Needs Assistance Bed Mobility:  Supine to Sit, Sit to Supine     Supine to sit: Mod assist, Max assist Sit to supine: Mod assist, Max assist   General bed mobility comments: increased time, labored movement with c/o right shoulder pain    Transfers Overall transfer level: Needs assistance Equipment used: Rolling walker (2 wheels) Transfers: Sit to/from Stand Sit to Stand: Max assist           General transfer comment: poor tolerance for standing due to BLE weakness    Ambulation/Gait                  Stairs            Wheelchair Mobility     Tilt Bed    Modified Rankin (Stroke Patients Only)       Balance Overall balance assessment: Needs assistance Sitting-balance support: Feet supported, Bilateral upper extremity supported Sitting balance-Leahy Scale: Fair Sitting balance - Comments: seated at EOB   Standing balance support: During functional activity, Reliant on assistive device for balance, Bilateral upper extremity supported Standing balance-Leahy Scale: Poor Standing balance comment: frequent buckling of knees due to weakness                             Pertinent Vitals/Pain Pain Assessment Pain Assessment: Faces Faces Pain Scale: Hurts little more Pain Location: RLE and Right shoulder Pain Descriptors / Indicators: Aching, Discomfort, Grimacing, Guarding, Sore Pain Intervention(s): Limited activity within patient's  tolerance, Monitored during session, Repositioned    Home Living Family/patient expects to be discharged to:: Private residence Living Arrangements: Children Available Help at Discharge: Family;Available PRN/intermittently Type of Home: House Home Access: Stairs to enter Entrance Stairs-Rails: Right;Left;Can reach both Entrance Stairs-Number of Steps: 5-6   Home Layout: One level Home Equipment: Rollator (4 wheels);Cane - single point;Shower seat;Grab bars - tub/shower;Rolling Walker (2 wheels);BSC/3in1;Wheelchair - manual Additional  Comments: Pt's family reported no change in previous living history.    Prior Function Prior Level of Function : Needs assist       Physical Assist : ADLs (physical)   ADLs (physical): Bathing;IADLs Mobility Comments: uses RW or Rolator for mobility ADLs Comments: Dtr assist with all bathing, occasional assist with RUE dressing. Dtr drives pt to therapy     Extremity/Trunk Assessment   Upper Extremity Assessment Upper Extremity Assessment: Generalized weakness    Lower Extremity Assessment Lower Extremity Assessment: Generalized weakness    Cervical / Trunk Assessment Cervical / Trunk Assessment: Normal  Communication   Communication Communication: No apparent difficulties Cueing Techniques: Verbal cues;Tactile cues  Cognition Arousal: Alert Behavior During Therapy: WFL for tasks assessed/performed, Anxious Overall Cognitive Status: Within Functional Limits for tasks assessed Area of Impairment: Safety/judgement, Problem solving                 Orientation Level: Person, Place                      General Comments      Exercises     Assessment/Plan    PT Assessment Patient needs continued PT services  PT Problem List Decreased strength;Decreased activity tolerance;Decreased balance;Decreased mobility       PT Treatment Interventions DME instruction;Gait training;Stair training;Functional mobility training;Therapeutic activities;Therapeutic exercise;Balance training;Patient/family education    PT Goals (Current goals can be found in the Care Plan section)  Acute Rehab PT Goals Patient Stated Goal: return home PT Goal Formulation: With patient Time For Goal Achievement: 06/28/23 Potential to Achieve Goals: Good    Frequency Min 2X/week     Co-evaluation               AM-PAC PT "6 Clicks" Mobility  Outcome Measure Help needed turning from your back to your side while in a flat bed without using bedrails?: A Lot Help needed moving  from lying on your back to sitting on the side of a flat bed without using bedrails?: A Lot Help needed moving to and from a bed to a chair (including a wheelchair)?: A Lot Help needed standing up from a chair using your arms (e.g., wheelchair or bedside chair)?: A Lot Help needed to walk in hospital room?: Total Help needed climbing 3-5 steps with a railing? : Total 6 Click Score: 10    End of Session   Activity Tolerance: Patient tolerated treatment well;Patient limited by fatigue;Patient limited by pain Patient left: in bed;with call bell/phone within reach;with nursing/sitter in room Nurse Communication: Mobility status PT Visit Diagnosis: Unsteadiness on feet (R26.81);Other abnormalities of gait and mobility (R26.89);Muscle weakness (generalized) (M62.81) Pain - Right/Left: Right Pain - part of body: Shoulder;Leg    Time: 4403-4742 PT Time Calculation (min) (ACUTE ONLY): 20 min   Charges:     PT Treatments $Therapeutic Activity: 8-22 mins PT General Charges $$ ACUTE PT VISIT: 1 Visit         10:45 AM, 06/14/23 Ocie Bob, MPT Physical Therapist with [facility] 336 [phone] office [phone] mobile phone

## 2023-06-14 NOTE — Plan of Care (Signed)
  Problem: Acute Rehab PT Goals(only PT should resolve) Goal: Pt Will Go Supine/Side To Sit Outcome: Progressing Flowsheets (Taken 06/14/2023 1046) Pt will go Supine/Side to Sit: with moderate assist Goal: Patient Will Transfer Sit To/From Stand Outcome: Progressing Flowsheets (Taken 06/14/2023 1046) Patient will transfer sit to/from stand: with moderate assist Goal: Pt Will Transfer Bed To Chair/Chair To Bed Outcome: Progressing Flowsheets (Taken 06/14/2023 1046) Pt will Transfer Bed to Chair/Chair to Bed: with mod assist Goal: Pt Will Ambulate Outcome: Progressing Flowsheets (Taken 06/14/2023 1046) Pt will Ambulate:  10 feet  with rolling walker  with moderate assist   10:47 AM, 06/14/23 Ocie Bob, MPT Physical Therapist with [facility] [phone] office [phone] mobile phone

## 2023-06-14 NOTE — [place] FL2 (Signed)
[place] MEDICAID FL2 LEVEL OF CARE FORM     IDENTIFICATION  Patient Name: [name] Birthdate: 27-Jun-1934 Sex: female Admission Date (Current Location): 06/14/2023  [facility] and [place] Number:  [employer] and Address:  [facility],  [address] [place] [address]      Provider Number: [id number]  Attending Physician Name and Address:  Derwood Kaplan, MD  Relative Name and Phone Number:  [name], [name] (Daughter)  [phone]    Current Level of Care: Hospital Recommended Level of Care: Skilled Nursing Facility Prior Approval Number:    Date Approved/Denied:   PASRR Number: PENDING- NCMUST SYSTEM ISSUES  Discharge Plan: SNF    Current Diagnoses: Patient Active Problem List   Diagnosis Date Noted   Fever 06/11/2023   Edema of both legs 06/10/2023   Acute on chronic heart failure with preserved ejection fraction (HCC) 06/10/2023   Cervical stenosis of spine 05/08/2023   Paroxysmal atrial fibrillation (HCC) 04/04/2023   Hypercoagulable state due to paroxysmal atrial fibrillation (HCC) 04/04/2023   Numbness and tingling of both feet 03/23/2023   Hypoalbuminemia due to protein-calorie malnutrition (HCC) 03/23/2023   Bilateral primary osteoarthritis of knee 11/01/2022   Encounter for general adult medical examination with abnormal findings 11/01/2022   Chronic kidney disease, stage 3b (HCC) 08/21/2022   Left pontine stroke (HCC) 11/06/2021   Dyslipidemia 11/06/2021   GERD without esophagitis 11/06/2021   Benign hypertensive kidney disease with chronic kidney disease 07/31/2019   Proteinuria 07/31/2019   Secondary hyperparathyroidism of renal origin (HCC) 07/31/2019   CKD stage 3b, GFR 30-44 ml/min (HCC) 07/31/2019   CKD (chronic kidney disease), stage III (HCC) 11/23/2016   Pericardial effusion 11/23/2016   Complete heart block (HCC) 11/16/2016   Non-ST elevation (NSTEMI) myocardial infarction (HCC) 11/16/2016   Renal insufficiency  11/03/2015   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 09/06/2015   Uncontrolled hypertension 08/31/2015   HTN (hypertension), malignant    Symptomatic bradycardia    Mixed hyperlipidemia    Epigastric fullness    Near syncope 08/28/2015   CAD S/P PCI    Essential hypertension 03/05/2013   Obesity (BMI 30-39.9) 03/05/2013   Hyperlipidemia with target LDL less than 70 03/05/2013   Obstructive sleep apnea 03/05/2013    Orientation RESPIRATION BLADDER Height & Weight     Self, Situation, Place, Time  Normal Continent Weight: 199 lb 8.3 oz (90.5 kg) Height:  5\' 5"  (165.1 cm)  BEHAVIORAL SYMPTOMS/MOOD NEUROLOGICAL BOWEL NUTRITION STATUS      Continent Diet (Regular)  AMBULATORY STATUS COMMUNICATION OF NEEDS Skin   Extensive Assist Verbally Normal                       Personal Care Assistance Level of Assistance  Bathing, Feeding, Dressing Bathing Assistance: Maximum assistance Feeding assistance: Limited assistance Dressing Assistance: Maximum assistance     Functional Limitations Info  Sight, Hearing, Speech Sight Info: Adequate Hearing Info: Adequate Speech Info: Adequate    SPECIAL CARE FACTORS FREQUENCY  PT (By licensed PT), OT (By licensed OT)     PT Frequency: x5/week OT Frequency: x5/week            Contractures Contractures Info: Not present    Additional Factors Info  Code Status, Allergies Code Status Info: Full Allergies Info: Atorvastatin  Contrast Media (Iodinated Contrast Media)  Darvon (Propoxyphene)  Codeine           Current Medications (06/14/2023):  This  is the current hospital active medication list Current Facility-Administered Medications  Medication Dose Route Frequency Provider Last Rate Last Admin   acetaminophen (TYLENOL) tablet 1,000 mg  1,000 mg Oral Q6H PRN Derwood Kaplan, MD   1,000 mg at 06/14/23 1031   aspirin EC tablet 81 mg  81 mg Oral Daily Nanavati, Ankit, MD   81 mg at 06/14/23 0955    hydrALAZINE (APRESOLINE) tablet 25 mg  25 mg Oral BID Derwood Kaplan, MD   25 mg at 06/14/23 0955   metoprolol succinate (TOPROL-XL) 24 hr tablet 12.5 mg  12.5 mg Oral Daily Rhunette Croft, Ankit, MD   12.5 mg at 06/14/23 0955   Muscle Rub CREA   Topical BID PRN Orson Aloe, RPH       pantoprazole (PROTONIX) EC tablet 40 mg  40 mg Oral Daily Derwood Kaplan, MD   40 mg at 06/14/23 0955   potassium chloride (KLOR-CON) packet 20 mEq  20 mEq Oral Daily Derwood Kaplan, MD   20 mEq at 06/14/23 2841   Rivaroxaban (XARELTO) tablet 15 mg  15 mg Oral Q supper Derwood Kaplan, MD       rosuvastatin (CRESTOR) tablet 40 mg  40 mg Oral Daily Rhunette Croft, Ankit, MD   40 mg at 06/14/23 0955   [START ON 06/15/2023] torsemide (DEMADEX) tablet 20 mg  20 mg Oral Daily Derwood Kaplan, MD       Current Outpatient Medications  Medication Sig Dispense Refill   acetaminophen (TYLENOL) 500 MG tablet Take 2 tablets (1,000 mg total) by mouth every 6 (six) hours as needed for mild pain or moderate pain.     aspirin EC 81 MG tablet Take 1 tablet (81 mg total) by mouth daily. Swallow whole.     ergocalciferol (VITAMIN D2) 1.25 MG (50000 UT) capsule Take 50,000 Units by mouth every 30 (thirty) days.     hydrALAZINE (APRESOLINE) 25 MG tablet Take 1 tablet (25 mg total) by mouth in the morning and at bedtime. 180 tablet 3   Menthol, Topical Analgesic, (BIOFREEZE ROLL-ON) 4 % GEL Apply 1 application. topically daily as needed (pain).     Menthol, Topical Analgesic, (BIOFREEZE) 10 % CREA Apply 1 application  topically in the morning and at bedtime.     metoprolol succinate (TOPROL XL) 25 MG 24 hr tablet Take 0.5 tablets (12.5 mg total) by mouth daily. 15 tablet 5   nitroGLYCERIN (NITROSTAT) 0.4 MG SL tablet PLACE 1 TABLET UNDER THE TONGUE EVERY 5 MINUTES AS NEEDED. 25 tablet 3   pantoprazole (PROTONIX) 40 MG tablet TAKE 1 TABLET BY MOUTH EVERY DAY 90 tablet 0   potassium chloride (KLOR-CON) 20 MEQ packet Take 20 mEq by mouth  daily. 30 packet 0   Rivaroxaban (XARELTO) 15 MG TABS tablet Take 1 tablet (15 mg total) by mouth daily with supper. 30 tablet 6   rosuvastatin (CRESTOR) 40 MG tablet TAKE 1 TABLET BY MOUTH EVERY DAY 90 tablet 1   torsemide (DEMADEX) 20 MG tablet Take 1 tablet (20 mg total) by mouth daily. 30 tablet 0     Discharge Medications: Please see discharge summary for a list of discharge medications.  Relevant Imaging Results:  Relevant Lab Results:   Additional Information SSN: [id number]  Princella Ion, LCSW

## 2023-06-14 NOTE — ED Triage Notes (Signed)
Patient from home for weakness and pain all over x2 days. Patient was recently admitted to [facility] for fluid overload and discharged with Lasix for CHF. EMS reports patient's daughter states that the swelling is not going down. Upon arrival to ER, patient is alert and oriented

## 2023-06-15 DIAGNOSIS — B9620 Unspecified Escherichia coli [E. coli] as the cause of diseases classified elsewhere: Secondary | ICD-10-CM | POA: Diagnosis not present

## 2023-06-15 DIAGNOSIS — L89152 Pressure ulcer of sacral region, stage 2: Secondary | ICD-10-CM | POA: Diagnosis not present

## 2023-06-15 DIAGNOSIS — N1832 Chronic kidney disease, stage 3b: Secondary | ICD-10-CM | POA: Diagnosis not present

## 2023-06-15 DIAGNOSIS — N390 Urinary tract infection, site not specified: Secondary | ICD-10-CM | POA: Diagnosis present

## 2023-06-15 DIAGNOSIS — L8990 Pressure ulcer of unspecified site, unspecified stage: Secondary | ICD-10-CM | POA: Insufficient documentation

## 2023-06-15 DIAGNOSIS — I442 Atrioventricular block, complete: Secondary | ICD-10-CM | POA: Diagnosis not present

## 2023-06-15 DIAGNOSIS — E782 Mixed hyperlipidemia: Secondary | ICD-10-CM | POA: Diagnosis not present

## 2023-06-15 DIAGNOSIS — I130 Hypertensive heart and chronic kidney disease with heart failure and stage 1 through stage 4 chronic kidney disease, or unspecified chronic kidney disease: Secondary | ICD-10-CM | POA: Diagnosis not present

## 2023-06-15 DIAGNOSIS — E66811 Obesity, class 1: Secondary | ICD-10-CM | POA: Diagnosis not present

## 2023-06-15 DIAGNOSIS — I5032 Chronic diastolic (congestive) heart failure: Secondary | ICD-10-CM | POA: Diagnosis not present

## 2023-06-15 DIAGNOSIS — R630 Anorexia: Secondary | ICD-10-CM | POA: Diagnosis not present

## 2023-06-15 DIAGNOSIS — I10 Essential (primary) hypertension: Secondary | ICD-10-CM | POA: Diagnosis not present

## 2023-06-15 DIAGNOSIS — Z6833 Body mass index (BMI) 33.0-33.9, adult: Secondary | ICD-10-CM | POA: Diagnosis not present

## 2023-06-15 DIAGNOSIS — M6281 Muscle weakness (generalized): Secondary | ICD-10-CM | POA: Diagnosis not present

## 2023-06-15 DIAGNOSIS — R531 Weakness: Secondary | ICD-10-CM | POA: Diagnosis not present

## 2023-06-15 DIAGNOSIS — D631 Anemia in chronic kidney disease: Secondary | ICD-10-CM | POA: Diagnosis not present

## 2023-06-15 DIAGNOSIS — N179 Acute kidney failure, unspecified: Secondary | ICD-10-CM | POA: Diagnosis not present

## 2023-06-15 DIAGNOSIS — K219 Gastro-esophageal reflux disease without esophagitis: Secondary | ICD-10-CM | POA: Diagnosis not present

## 2023-06-15 DIAGNOSIS — I480 Paroxysmal atrial fibrillation: Secondary | ICD-10-CM | POA: Diagnosis not present

## 2023-06-15 DIAGNOSIS — I2510 Atherosclerotic heart disease of native coronary artery without angina pectoris: Secondary | ICD-10-CM | POA: Diagnosis not present

## 2023-06-15 DIAGNOSIS — K2100 Gastro-esophageal reflux disease with esophagitis, without bleeding: Secondary | ICD-10-CM | POA: Diagnosis not present

## 2023-06-15 DIAGNOSIS — I252 Old myocardial infarction: Secondary | ICD-10-CM | POA: Diagnosis not present

## 2023-06-15 DIAGNOSIS — G9341 Metabolic encephalopathy: Secondary | ICD-10-CM | POA: Diagnosis not present

## 2023-06-15 DIAGNOSIS — Z1612 Extended spectrum beta lactamase (ESBL) resistance: Secondary | ICD-10-CM | POA: Diagnosis not present

## 2023-06-15 DIAGNOSIS — G4733 Obstructive sleep apnea (adult) (pediatric): Secondary | ICD-10-CM | POA: Diagnosis not present

## 2023-06-15 DIAGNOSIS — G4700 Insomnia, unspecified: Secondary | ICD-10-CM | POA: Diagnosis not present

## 2023-06-15 DIAGNOSIS — Z1152 Encounter for screening for COVID-19: Secondary | ICD-10-CM | POA: Diagnosis not present

## 2023-06-15 DIAGNOSIS — N2581 Secondary hyperparathyroidism of renal origin: Secondary | ICD-10-CM | POA: Diagnosis not present

## 2023-06-15 LAB — URINALYSIS, ROUTINE W REFLEX MICROSCOPIC
Bilirubin Urine: NEGATIVE
Glucose, UA: NEGATIVE mg/dL
Ketones, ur: 5 mg/dL — AB
Nitrite: POSITIVE — AB
Protein, ur: 100 mg/dL — AB
Specific Gravity, Urine: 1.011 (ref 1.005–1.030)
WBC, UA: >50 WBC/hpf (ref 0–5)
pH: 8 (ref 5.0–8.0)

## 2023-06-15 LAB — CBC
HCT: 33.6 % — ABNORMAL LOW (ref 36.0–46.0)
Hemoglobin: 10.6 g/dL — ABNORMAL LOW (ref 12.0–15.0)
MCH: 29 pg (ref 26.0–34.0)
MCHC: 31.5 g/dL (ref 30.0–36.0)
MCV: 91.8 fL (ref 80.0–100.0)
Platelets: 330 10*3/uL (ref 150–400)
RBC: 3.66 MIL/uL — ABNORMAL LOW (ref 3.87–5.11)
RDW: 15.6 % — ABNORMAL HIGH (ref 11.5–15.5)
WBC: 10 10*3/uL (ref 4.0–10.5)
nRBC: 0 % (ref 0.0–0.2)

## 2023-06-15 LAB — RENAL FUNCTION PANEL
Albumin: 2 g/dL — ABNORMAL LOW (ref 3.5–5.0)
Anion gap: 9 (ref 5–15)
BUN: 27 mg/dL — ABNORMAL HIGH (ref 8–23)
CO2: 24 mmol/L (ref 22–32)
Calcium: 8 mg/dL — ABNORMAL LOW (ref 8.9–10.3)
Chloride: 101 mmol/L (ref 98–111)
Creatinine, Ser: 1.51 mg/dL — ABNORMAL HIGH (ref 0.44–1.00)
GFR, Estimated: 33 mL/min — ABNORMAL LOW (ref >60)
Glucose, Bld: 96 mg/dL (ref 70–99)
Phosphorus: 3.4 mg/dL (ref 2.5–4.6)
Potassium: 3.9 mmol/L (ref 3.5–5.1)
Sodium: 134 mmol/L — ABNORMAL LOW (ref 135–145)

## 2023-06-15 MED ORDER — TRAZODONE HCL 50 MG PO TABS
50.0000 mg | ORAL_TABLET | Freq: Every evening | ORAL | Status: DC | PRN
  Filled 2023-06-15: qty 1

## 2023-06-15 MED ORDER — POLYETHYLENE GLYCOL 3350 17 G PO PACK
17.0000 g | PACK | Freq: Every day | ORAL | Status: DC | PRN
  Administered 2023-06-19: 17 g via ORAL
  Filled 2023-06-15: qty 1

## 2023-06-15 MED ORDER — SODIUM CHLORIDE 0.9% FLUSH: 3.0000 mL | INTRAVENOUS | Status: DC | PRN

## 2023-06-15 MED ORDER — ONDANSETRON HCL 4 MG/2ML IJ SOLN
4.0000 mg | Freq: Four times a day (QID) | INTRAMUSCULAR | Status: DC | PRN
  Administered 2023-06-17: 4 mg via INTRAVENOUS
  Filled 2023-06-15: qty 2

## 2023-06-15 MED ORDER — SODIUM CHLORIDE 0.9 % IV SOLN
1.0000 g | INTRAVENOUS | Status: DC
Start: 2023-06-15 — End: 2023-06-15

## 2023-06-15 MED ORDER — ACETAMINOPHEN 650 MG RE SUPP: 650.0000 mg | Freq: Four times a day (QID) | RECTAL | Status: DC | PRN

## 2023-06-15 MED ORDER — ONDANSETRON HCL 4 MG PO TABS: 4.0000 mg | ORAL_TABLET | Freq: Four times a day (QID) | ORAL | Status: DC | PRN

## 2023-06-15 MED ORDER — SODIUM CHLORIDE 0.9% FLUSH
3.0000 mL | Freq: Two times a day (BID) | INTRAVENOUS | Status: DC
  Administered 2023-06-15 – 2023-06-20 (×5): 3 mL via INTRAVENOUS

## 2023-06-15 MED ORDER — SODIUM CHLORIDE 0.9 % IV SOLN: INTRAVENOUS | Status: DC | PRN

## 2023-06-15 MED ORDER — BISACODYL 10 MG RE SUPP: 10.0000 mg | Freq: Every day | RECTAL | Status: DC | PRN

## 2023-06-15 MED ORDER — MIRTAZAPINE 15 MG PO TABS
15.0000 mg | ORAL_TABLET | Freq: Every day | ORAL | Status: DC
  Filled 2023-06-15: qty 1

## 2023-06-15 MED ORDER — SODIUM CHLORIDE 0.9% FLUSH
3.0000 mL | Freq: Two times a day (BID) | INTRAVENOUS | Status: DC
  Administered 2023-06-15 – 2023-06-17 (×6): 3 mL via INTRAVENOUS

## 2023-06-15 MED ORDER — SODIUM CHLORIDE 0.9 % IV SOLN
1.0000 g | INTRAVENOUS | Status: DC
  Administered 2023-06-16 – 2023-06-17 (×2): 1 g via INTRAVENOUS
  Filled 2023-06-15 (×2): qty 10

## 2023-06-15 MED ORDER — HYDRALAZINE HCL 20 MG/ML IJ SOLN: 10.0000 mg | Freq: Four times a day (QID) | INTRAMUSCULAR | Status: DC | PRN

## 2023-06-15 MED ORDER — SODIUM CHLORIDE 0.9 % IV SOLN
2.0000 g | Freq: Once | INTRAVENOUS | Status: AC
  Administered 2023-06-15: 2 g via INTRAVENOUS
  Filled 2023-06-15: qty 20

## 2023-06-15 MED ORDER — ACETAMINOPHEN 325 MG PO TABS
650.0000 mg | ORAL_TABLET | Freq: Four times a day (QID) | ORAL | Status: DC | PRN
  Administered 2023-06-16 – 2023-06-19 (×6): 650 mg via ORAL
  Filled 2023-06-15 (×7): qty 2

## 2023-06-15 NOTE — ED Notes (Signed)
Spoke with social worker still seeking SNF placement no beds available. Pt's daughters updated. Daughter states that she does not want pt placed in SNF that she wants at home PT/ rehab. Social work notified at this time.

## 2023-06-15 NOTE — H&P (Signed)
Patient Demographics:    [name], is a 89 y.o. female  MRN: [id number]   DOB - 07-07-1934  Admit Date - 06/14/2023  Outpatient Primary MD for the patient is [name], MD   Assessment & Plan:   Assessment and Plan: 1) acute metabolic encephalopathy--- secondary to presumed UTI -spiked a fever of 101 on 06/15/2023 with UA suggestive of UTI, patient was found to be more lethargic, somewhat forgetful/confused and nauseous consistent with acute metabolic encephalopathy due to presumed UTI -IV Rocephin as ordered pending urine and blood cultures  2)Generalized weakness and deconditioning--patient with significant mobility related challenges -Patient was found to be weak prior to discharge evaluated by physical therapy patient and family declined SNF placement on 06/12/2023 prior to discharge from inpatient service [facility] -When patient arrived home on 06/12/2023 required wheelchair to get from the car into the house she could not ambulate -When she woke up on 06/14/2023 she had significant difficulties with mobility related ADLs- -Pt is unable to perform mobility related ADLs Will request PT reeval  3)CAD s/p DES x2 in 2011 NSTEMI s/p DES x1 in 2018 Mixed HLD Hx CVA 08/2022 Patient with extensive history of NSTEMI, stents, and stroke.  Continued PTA aspirin, Crestor and metoprolol   4)CKD stage IIIb Secondary hyperparathyroidism -creatinine is 1.51 down from 1.83 on 06/14/2023 baseline creatinine usually around 1.2-1.3   5)PAFib-continue Xarelto for stroke prophylaxis and metoprolol for rate control History of temporary pacemaker placement 2018, last interrogated August 2024. -   6)HTN -Continue metoprolol and hydralazine  7)Class 1 Obesity-/OSA -Low calorie diet, portion control and increase  physical activity discussed with patient -Body mass index is 33.2 kg/m. -BiPAP nightly advised  8) chronic anemia--multifactorial in the setting of CKD and chronic Xarelto and aspirin use -Hgb is close to baseline -No bleeding concerns at this time -Monitor closely  9) insomnia and anorexia--- patient and family agreed to trial of Remeron  10)chronic HFpEF/chronic diastolic dysfunction CHF/chronic bilateral lower extremity edema -Torsemide on hold due to increased creatinine on 06/14/2023 Reports frequent UTIs so therefore would not be a great candidate for SGLT2  -Continue metoprolol -Restart torsemide when feasible -TED stockings advised  Status is: Inpatient  Remains inpatient appropriate because:   Dispo: The patient is from: Home              Anticipated d/c is to: SNF              Anticipated d/c date is: 2 days              Patient currently is not medically stable to d/c. Barriers: Not Clinically Stable-    With History of - Reviewed by me  Past Medical History:  Diagnosis Date   Arthritis    CKD (chronic kidney disease), stage III (HCC)    Complete heart block (HCC) 11/16/2016   a. transient during NSTEMI, resolved with revascularization.   Coronary artery disease  11/2009   a. with prior stent placement to the ramus intermedius and RCA. b. NSTEMI 11/2016 s/p DES to DES to distal Cx with moderate residual dz.   CVA (cerebral vascular accident) [facility])    Essential hypertension    Mixed hyperlipidemia    Non-ST elevation (NSTEMI) myocardial infarction (HCC) 11/16/2016   Obesity    Osteoarthritis    Reflux esophagitis    Sleep apnea 09/27/2010   Untreated, REM 64.7/hr AHI 18.5/hr RDI 19.2/hr. Patuent refused CPAP therapy      Past Surgical History:  Procedure Laterality Date   CHOLECYSTECTOMY     CORONARY ANGIOPLASTY WITH STENT PLACEMENT  2007   A 3.0x48mm CYPHER stent post dilated to 3.29 mm the 100% occlusion was reduced to 0%   CORONARY STENT INTERVENTION  N/A 11/16/2016   Procedure: Coronary Stent Intervention;  Surgeon: Tonny Bollman, MD;  Location: [facility] INVASIVE CV LAB;  Service: Cardiovascular;  Laterality: N/A;   LEFT HEART CATH AND CORONARY ANGIOGRAPHY N/A 11/16/2016   Procedure: Left Heart Cath and Coronary Angiography;  Surgeon: Tonny Bollman, MD;  Location: [facility] INVASIVE CV LAB;  Service: Cardiovascular;  Laterality: N/A;   LEFT HEART CATHETERIZATION WITH CORONARY ANGIOGRAM N/A 07/12/2014   Procedure: LEFT HEART CATHETERIZATION WITH CORONARY ANGIOGRAM;  Surgeon: Micheline Chapman, MD;  Location: [facility] CATH LAB;  Service: Cardiovascular;  Laterality: N/A;   TEMPORARY PACEMAKER N/A 11/16/2016   Procedure: Temporary Pacemaker;  Surgeon: Tonny Bollman, MD;  Location: [facility] INVASIVE CV LAB;  Service: Cardiovascular;  Laterality: N/A;    Chief Complaint  Patient presents with   Weakness      HPI:    [name]  is a 89 y.o. female with Pmhx of HFpEF (LVEF 65-70% July 2024), NSTEMI 2018, CAD s/p stent placement, CVA (2023), CKD stage IIIb, PAfib, HLD, , HTN recently treated at [facility] health internal medicine residency inpatient service from 06/10/2023 to 06/12/2023 -Patient was found to be weak prior to discharge evaluated by physical therapy patient and family declined SNF placement -When patient arrived home on 06/12/2023 required wheelchair to get from the car into the house she could not ambulate -When she woke up on 06/14/2023 she had significant difficulties with mobility related ADLs- -family brought her back to the ED on 06/14/2023 due to worsening weakness and lower extremity edema as well as increasing creatinine --Plan was to place at an SNF facility--TOC/social workers were working on this when she spiked a fever of 101 on 06/15/2023 with UA suggestive of UTI, patient was found to be more lethargic, somewhat forgetful/confused and nauseous consistent with acute metabolic encephalopathy due to presumed UTI -EDP requested evaluation by hospitalist  service for possible admission -Blood and urine cultures requested -WBC is 10.0, Hgb 10.6 platelets 330 --sodium is 134-creatinine is 1.51 down from 1.83 on 06/14/2023 baseline creatinine usually around 1.2-1.3 -Additional history obtained from patient's daughters Tobi Bastos and Dois Davenport at bedside           Review of systems:    In addition to the HPI above,   A full Review of  Systems was done, all other systems reviewed are negative except as noted above in HPI , .    Social History:  Reviewed by me    Social History   Tobacco Use   Smoking status: Never   Smokeless tobacco: Never  Substance Use Topics   Alcohol use: No       Family History :  Reviewed by me    Family History  Problem  Relation Age of Onset   Hypertension Mother    Aneurysm Mother        died at age 13   Other Father        unsure of medical history   Hypertension Other     Home Medications:   Prior to Admission medications   Medication Sig Start Date End Date Taking? Authorizing Provider  acetaminophen (TYLENOL) 500 MG tablet Take 2 tablets (1,000 mg total) by mouth every 6 (six) hours as needed for mild pain or moderate pain. 03/25/23  Yes Rolly Salter, MD  aspirin EC 81 MG tablet Take 1 tablet (81 mg total) by mouth daily. Swallow whole. 04/05/23  Yes Fenton, Clint R, PA  ergocalciferol (VITAMIN D2) 1.25 MG (50000 UT) capsule Take 50,000 Units by mouth every 30 (thirty) days. 04/12/23  Yes [provider]  hydrALAZINE (APRESOLINE) 25 MG tablet Take 1 tablet (25 mg total) by mouth in the morning and at bedtime. 11/21/22 11/16/23 Yes Jonelle Sidle, MD  Menthol, Topical Analgesic, (BIOFREEZE ROLL-ON) 4 % GEL Apply 1 application. topically daily as needed (pain).   Yes [provider]  Menthol, Topical Analgesic, (BIOFREEZE) 10 % CREA Apply 1 application  topically in the morning and at bedtime.   Yes [provider]  metoprolol succinate (TOPROL XL) 25 MG 24 hr tablet Take  0.5 tablets (12.5 mg total) by mouth daily. 02/11/23  Yes Sharlene Dory, NP  nitroGLYCERIN (NITROSTAT) 0.4 MG SL tablet PLACE 1 TABLET UNDER THE TONGUE EVERY 5 MINUTES AS NEEDED. 11/21/22  Yes Jonelle Sidle, MD  pantoprazole (PROTONIX) 40 MG tablet TAKE 1 TABLET BY MOUTH EVERY DAY 03/22/23  Yes Jonelle Sidle, MD  potassium chloride (KLOR-CON) 20 MEQ packet Take 20 mEq by mouth daily. 06/13/23  Yes Philomena Doheny, MD  Rivaroxaban (XARELTO) 15 MG TABS tablet Take 1 tablet (15 mg total) by mouth daily with supper. 04/12/23  Yes Jonelle Sidle, MD  rosuvastatin (CRESTOR) 40 MG tablet TAKE 1 TABLET BY MOUTH EVERY DAY 05/31/23  Yes Jonelle Sidle, MD  torsemide (DEMADEX) 20 MG tablet Take 1 tablet (20 mg total) by mouth daily. 06/13/23  Yes Philomena Doheny, MD     Allergies:     Allergies  Allergen Reactions   Atorvastatin Nausea And Vomiting   Contrast Media [Iodinated Contrast Media] Other (See Comments)    Nausea/Vomiting and Shortness of Breath   Darvon [Propoxyphene] Nausea And Vomiting   Codeine Nausea And Vomiting and Anxiety     Physical Exam:   Vitals  Blood pressure (!) 129/94, pulse 86, temperature 98.3 F (36.8 C), temperature source Oral, resp. rate 19, height 5\' 5"  (1.651 m), weight 90.5 kg, SpO2 99%.  Physical Examination: General appearance - alert,  in no distresS Mental status -at times, occasional confusion and disorientation  eyes - sclera anicteric Neck - supple, no JVD elevation , Chest - clear  to auscultation bilaterally, symmetrical air movement,  Heart - S1 and S2 normal, regular , pacemaker in situ Abdomen - soft, nontender, nondistended, +BS, no CVA area tenderness Neurological -somewhat lethargic and confused at times,, generalized weakness without new focal deficits per se Extremities -+2 pitting pedal edema noted, intact peripheral pulses  Skin - warm, dry     Data Review:    CBC Recent Labs  Lab 06/09/23 2319  06/11/23 0434 06/12/23 0431 06/14/23 0520 06/15/23 1209  WBC 10.3 8.3 7.6 9.5 10.0  HGB 11.1* 11.4* 10.7* 10.8* 10.6*  HCT 35.5* 36.0 32.1* 34.2* 33.6*  PLT 278 283 257 307 330  MCV 92.4 92.5 90.7 91.4 91.8  MCH 28.9 29.3 30.2 28.9 29.0  MCHC 31.3 31.7 33.3 31.6 31.5  RDW 16.2* 15.9* 15.8* 15.8* 15.6*  LYMPHSABS 2.2  --   --   --   --   MONOABS 1.2*  --   --   --   --   EOSABS 0.1  --   --   --   --   BASOSABS 0.0  --   --   --   --    ------------------------------------------------------------------------------------------------------------------  Chemistries  Recent Labs  Lab 06/09/23 2319 06/11/23 0434 06/12/23 0431 06/14/23 0520 06/15/23 1209  NA 138 134* 135 135 134*  K 3.7 3.5 3.2* 3.8 3.9  CL 106 99 100 99 101  CO2 24 23 25 25 24   GLUCOSE 131* 113* 120* 117* 96  BUN 15 16 18  31* 27*  CREATININE 1.24* 1.32* 1.38* 1.83* 1.51*  CALCIUM 8.2* 8.1* 7.9* 8.3* 8.0*  MG  --  1.4* 2.0 1.9  --   AST 24  --   --  27  --   ALT 16  --   --  12  --   ALKPHOS 62  --   --  64  --   BILITOT 0.6  --   --  0.5  --    ------------------------------------------------------------------------------------------------------------------ estimated creatinine clearance is 29.7 mL/min (A) (by C-G formula based on SCr of 1.51 mg/dL (H)). ------------------------------------------------------------------------------------------------------------------  Coagulation profile Recent Labs  Lab 06/14/23 0520  INR 2.4*   -----------------------------------------------------------------------------    Component Value Date/Time   BNP 34.0 06/14/2023 0520   Urinalysis    Component Value Date/Time   COLORURINE AMBER (A) 06/15/2023 0934   APPEARANCEUR CLOUDY (A) 06/15/2023 0934   LABSPEC 1.011 06/15/2023 0934   PHURINE 8.0 06/15/2023 0934   GLUCOSEU NEGATIVE 06/15/2023 0934   HGBUR MODERATE (A) 06/15/2023 0934   BILIRUBINUR NEGATIVE 06/15/2023 0934   KETONESUR 5 (A) 06/15/2023 0934    PROTEINUR 100 (A) 06/15/2023 0934   UROBILINOGEN 1.0 07/11/2014 0914   NITRITE POSITIVE (A) 06/15/2023 0934   LEUKOCYTESUR MODERATE (A) 06/15/2023 0934    ----------------------------------------------------------------------------------------------------------------   Imaging Results:    DG Chest Port 1 View  Result Date: 06/14/2023 CLINICAL DATA:  89 year old female with history of shortness of breath. EXAM: PORTABLE CHEST 1 VIEW COMPARISON:  Chest x-ray 06/10/2023. FINDINGS: Lung volumes are normal. No consolidative airspace disease. Mild blunting of the costophrenic sulci may suggest chronic pleuroparenchymal scarring although trace bilateral pleural effusions are not excluded. No pneumothorax. No evidence of pulmonary edema. Heart size is mildly enlarged. Retrocardiac lucency indicative of a large hiatal hernia. Upper mediastinal contours are within normal limits. Electronic device projecting over the left hemithorax, presumably an implantable loop recorder. IMPRESSION: 1. Possible trace bilateral pleural effusions versus chronic basilar pleuroparenchymal scarring. No other acute findings. 2. Cardiomegaly. 3. Large hiatal hernia. Electronically Signed   By: Trudie Reed M.D.   On: 06/14/2023 05:50    Radiological Exams on Admission: DG Chest Port 1 View  Result Date: 06/14/2023 CLINICAL DATA:  89 year old female with history of shortness of breath. EXAM: PORTABLE CHEST 1 VIEW COMPARISON:  Chest x-ray 06/10/2023. FINDINGS: Lung volumes are normal. No consolidative airspace disease. Mild blunting of the costophrenic sulci may suggest chronic pleuroparenchymal scarring although trace bilateral pleural effusions are not excluded. No pneumothorax. No evidence of pulmonary edema. Heart size is mildly enlarged. Retrocardiac lucency  indicative of a large hiatal hernia. Upper mediastinal contours are within normal limits. Electronic device projecting over the left hemithorax, presumably an  implantable loop recorder. IMPRESSION: 1. Possible trace bilateral pleural effusions versus chronic basilar pleuroparenchymal scarring. No other acute findings. 2. Cardiomegaly. 3. Large hiatal hernia. Electronically Signed   By: Trudie Reed M.D.   On: 06/14/2023 05:50    DVT Prophylaxis -SCD/Xarelto AM Labs Ordered, also please review Full Orders  Family Communication: Admission, patients condition and plan of care including tests being ordered have been discussed with the patient and daughters Tobi Bastos and Dois Davenport who indicate understanding and agree with the plan   Condition  -stable  Shon Hale M.D on 06/15/2023 at 5:14 PM Go to [email or web address] -  for contact info  [facility] - Office  [phone]

## 2023-06-15 NOTE — ED Notes (Signed)
Spoke with family and pt after hospitalist. Pt and family agreement to being admitted and still wanting Rehab/ PT with HH.

## 2023-06-15 NOTE — Progress Notes (Signed)
Patient daughter refused remeron at 2200 and CPAP as well. Stated, "she doesn't not have sleep apnea and if they want her to have this she need another sleep study done". Plan of care ongoing.

## 2023-06-15 NOTE — ED Provider Notes (Signed)
Emergency Medicine Observation Re-evaluation Note  [name] is a 89 y.o. female, seen on rounds today.  Pt initially presented to the ED for complaints of Weakness Currently, the patient is awaiting nursing home placement.  Physical Exam  BP (!) 144/69   Pulse 75   Temp 99.2 F (37.3 C) (Oral)   Resp (!) 21   Ht 5\' 5"  (1.651 m)   Wt 90.5 kg   SpO2 99%   BMI 33.20 kg/m  Physical Exam Alert and in no acute distress  ED Course / MDM  EKG:EKG Interpretation Date/Time:  Friday June 14 2023 04:34:51 EDT Ventricular Rate:  86 PR Interval:  186 QRS Duration:  123 QT Interval:  424 QTC Calculation: 508 R Axis:   -55  Text Interpretation: duplicate, discard Confirmed by Dione Booze ([id number]) on 06/15/2023 7:08:09 AM  I have reviewed the labs performed to date as well as medications administered while in observation.  Recent changes in the last 24 hours include none.  Plan  Current plan is for nursing home placement.    Bethann Berkshire, MD 06/15/23 [phone]

## 2023-06-15 NOTE — Plan of Care (Signed)
  Problem: Education: Goal: Knowledge of disease or condition will improve Outcome: Progressing   

## 2023-06-15 NOTE — ED Notes (Signed)
ED TO INPATIENT HANDOFF REPORT  ED Nurse Name and Phone #: Jennette Kettle [phone]  S Name/Age/Gender [name] 89 y.o. female Room/Bed: APA05/APA05  Code Status   Code Status: Full Code  Home/SNF/Other Home Patient oriented to: self, place, time, and situation Is this baseline? Yes   Triage Complete: Triage complete  Chief Complaint Acute metabolic encephalopathy [G93.41]  Triage Note Patient from home for weakness and pain all over x2 days. Patient was recently admitted to [facility] for fluid overload and discharged with Lasix for CHF. EMS reports patient's daughter states that the swelling is not going down. Upon arrival to ER, patient is alert and oriented   Allergies Allergies  Allergen Reactions   Atorvastatin Nausea And Vomiting   Contrast Media [Iodinated Contrast Media] Other (See Comments)    Nausea/Vomiting and Shortness of Breath   Darvon [Propoxyphene] Nausea And Vomiting   Codeine Nausea And Vomiting and Anxiety    Level of Care/Admitting Diagnosis ED Disposition     ED Disposition  Admit   Condition  --   Comment  Hospital Area: [facility] [[id number]]  Level of Care: Telemetry [5]  Covid Evaluation: Asymptomatic - no recent exposure (last 10 days) testing not required  Diagnosis: Acute metabolic encephalopathy [0865784]  Admitting Physician: Marylyn Ishihara  Attending Physician: Marylyn Ishihara  Certification:: I certify this patient will need inpatient services for at least 2 midnights  Expected Medical Readiness: 06/17/2023          B Medical/Surgery History Past Medical History:  Diagnosis Date   Arthritis    CKD (chronic kidney disease), stage III (HCC)    Complete heart block (HCC) 11/16/2016   a. transient during NSTEMI, resolved with revascularization.   Coronary artery disease 11/2009   a. with prior stent placement to the ramus intermedius and RCA. b. NSTEMI 11/2016 s/p DES to DES to distal Cx with  moderate residual dz.   CVA (cerebral vascular accident) [facility])    Essential hypertension    Mixed hyperlipidemia    Non-ST elevation (NSTEMI) myocardial infarction (HCC) 11/16/2016   Obesity    Osteoarthritis    Reflux esophagitis    Sleep apnea 09/27/2010   Untreated, REM 64.7/hr AHI 18.5/hr RDI 19.2/hr. Patuent refused CPAP therapy   Past Surgical History:  Procedure Laterality Date   CHOLECYSTECTOMY     CORONARY ANGIOPLASTY WITH STENT PLACEMENT  2007   A 3.0x88mm CYPHER stent post dilated to 3.29 mm the 100% occlusion was reduced to 0%   CORONARY STENT INTERVENTION N/A 11/16/2016   Procedure: Coronary Stent Intervention;  Surgeon: Tonny Bollman, MD;  Location: [facility] INVASIVE CV LAB;  Service: Cardiovascular;  Laterality: N/A;   LEFT HEART CATH AND CORONARY ANGIOGRAPHY N/A 11/16/2016   Procedure: Left Heart Cath and Coronary Angiography;  Surgeon: Tonny Bollman, MD;  Location: [facility] INVASIVE CV LAB;  Service: Cardiovascular;  Laterality: N/A;   LEFT HEART CATHETERIZATION WITH CORONARY ANGIOGRAM N/A 07/12/2014   Procedure: LEFT HEART CATHETERIZATION WITH CORONARY ANGIOGRAM;  Surgeon: Micheline Chapman, MD;  Location: [facility] CATH LAB;  Service: Cardiovascular;  Laterality: N/A;   TEMPORARY PACEMAKER N/A 11/16/2016   Procedure: Temporary Pacemaker;  Surgeon: Tonny Bollman, MD;  Location: [facility] INVASIVE CV LAB;  Service: Cardiovascular;  Laterality: N/A;     A IV Location/Drains/Wounds Patient Lines/Drains/Airways Status     Active Line/Drains/Airways     Name Placement date Placement time Site Days   Peripheral IV 06/15/23 22 G Anterior;Left;Proximal Forearm 06/15/23  1100  Forearm  less than 1   Pressure Injury 06/14/23 Sacrum Stage 2 -  Partial thickness loss of dermis presenting as a shallow open injury with a red, pink wound bed without slough. 06/14/23  1143  -- 1            Intake/Output Last 24 hours  Intake/Output Summary (Last 24 hours) at 06/15/2023 1204 Last data filed at  06/15/2023 1203 Gross per 24 hour  Intake 103 ml  Output --  Net 103 ml    Labs/Imaging Results for orders placed or performed during the hospital encounter of 06/14/23 (from the past 48 hour(s))  Urinalysis, Routine w reflex microscopic -Urine, Clean Catch     Status: Abnormal   Collection Time: 06/14/23  5:07 AM  Result Value Ref Range   Color, Urine YELLOW YELLOW   APPearance CLOUDY (A) CLEAR   Specific Gravity, Urine 1.008 1.005 - 1.030   pH 7.0 5.0 - 8.0   Glucose, UA NEGATIVE NEGATIVE mg/dL   Hgb urine dipstick SMALL (A) NEGATIVE   Bilirubin Urine NEGATIVE NEGATIVE   Ketones, ur NEGATIVE NEGATIVE mg/dL   Protein, ur 30 (A) NEGATIVE mg/dL   Nitrite NEGATIVE NEGATIVE   Leukocytes,Ua TRACE (A) NEGATIVE   RBC / HPF 0-5 0 - 5 RBC/hpf   WBC, UA 0-5 0 - 5 WBC/hpf   Bacteria, UA RARE (A) NONE SEEN   Squamous Epithelial / HPF 0-5 0 - 5 /HPF   Mucus PRESENT    Crystals PRESENT (A) NEGATIVE    Comment: Performed at [facility], [address], [place], [place] [address]  CBC     Status: Abnormal   Collection Time: 06/14/23  5:20 AM  Result Value Ref Range   WBC 9.5 4.0 - 10.5 K/uL   RBC 3.74 (L) 3.87 - 5.11 MIL/uL   Hemoglobin 10.8 (L) 12.0 - 15.0 g/dL   HCT 60.4 (L) 54.0 - 98.1 %   MCV 91.4 80.0 - 100.0 fL   MCH 28.9 26.0 - 34.0 pg   MCHC 31.6 30.0 - 36.0 g/dL   RDW 19.1 (H) 47.8 - 29.5 %   Platelets 307 150 - 400 K/uL   nRBC 0.0 0.0 - 0.2 %    Comment: Performed at [facility], [address] [place], [place] [address]  Comprehensive metabolic panel     Status: Abnormal   Collection Time: 06/14/23  5:20 AM  Result Value Ref Range   Sodium 135 135 - 145 mmol/L   Potassium 3.8 3.5 - 5.1 mmol/L   Chloride 99 98 - 111 mmol/L   CO2 25 22 - 32 mmol/L   Glucose, Bld 117 (H) 70 - 99 mg/dL    Comment: Glucose reference range applies only to samples taken after fasting for at least 8 hours.   BUN 31 (H) 8 - 23 mg/dL   Creatinine, Ser 8.65 (H) 0.44 - 1.00 mg/dL   Calcium 8.3  (L) 8.9 - 10.3 mg/dL   Total Protein 6.0 (L) 6.5 - 8.1 g/dL   Albumin 2.3 (L) 3.5 - 5.0 g/dL   AST 27 15 - 41 U/L   ALT 12 0 - 44 U/L   Alkaline Phosphatase 64 38 - 126 U/L   Total Bilirubin 0.5 0.3 - 1.2 mg/dL   GFR, Estimated 27 (L) >60 mL/min    Comment: (NOTE) Calculated using the CKD-EPI Creatinine Equation (2021)    Anion gap 11 5 - 15    Comment: Performed at [facility], [address] [place], [place] [address]  Brain natriuretic peptide     Status: None   Collection Time: 06/14/23  5:20 AM  Result Value Ref Range   B Natriuretic Peptide 34.0 0.0 - 100.0 pg/mL    Comment: Performed at [facility], [address] [place], [place] [address]  Troponin I (High Sensitivity)     Status: Abnormal   Collection Time: 06/14/23  5:20 AM  Result Value Ref Range   Troponin I (High Sensitivity) 20 (H) <18 ng/L    Comment: (NOTE) Elevated high sensitivity troponin I (hsTnI) values and significant  changes across serial measurements may suggest ACS but many other  chronic and acute conditions are known to elevate hsTnI results.  Refer to the "Links" section for chest pain algorithms and additional  guidance. Performed at [facility], [address], [place], [place] [address]   Protime-INR     Status: Abnormal   Collection Time: 06/14/23  5:20 AM  Result Value Ref Range   Prothrombin Time 26.3 (H) 11.4 - 15.2 seconds   INR 2.4 (H) 0.8 - 1.2    Comment: (NOTE) INR goal varies based on device and disease states. Performed at [facility], [address] [place], [place] [address]   Magnesium     Status: None   Collection Time: 06/14/23  5:20 AM  Result Value Ref Range   Magnesium 1.9 1.7 - 2.4 mg/dL    Comment: Performed at [facility], [address], [place], [place] [address]  SARS Coronavirus 2 by RT PCR (hospital order, performed in [facility] hospital lab) *cepheid single result test* Anterior Nasal Swab     Status: None   Collection Time: 06/14/23  6:10 AM   Specimen:  Anterior Nasal Swab  Result Value Ref Range   SARS Coronavirus 2 by RT PCR NEGATIVE NEGATIVE    Comment: (NOTE) SARS-CoV-2 target nucleic acids are NOT DETECTED.  The SARS-CoV-2 RNA is generally detectable in upper and lower respiratory specimens during the acute phase of infection. The lowest concentration of SARS-CoV-2 viral copies this assay can detect is 250 copies / mL. A negative result does not preclude SARS-CoV-2 infection and should not be used as the sole basis for treatment or other patient management decisions.  A negative result may occur with improper specimen collection / handling, submission of specimen other than nasopharyngeal swab, presence of viral mutation(s) within the areas targeted by this assay, and inadequate number of viral copies (<250 copies / mL). A negative result must be combined with clinical observations, patient history, and epidemiological information.  Fact Sheet for Patients:   RoadLapTop.co.za  Fact Sheet for Healthcare Providers: http://kim-miller.com/  This test is not yet approved or  cleared by the [place] FDA and has been authorized for detection and/or diagnosis of SARS-CoV-2 by FDA under an Emergency Use Authorization (EUA).  This EUA will remain in effect (meaning this test can be used) for the duration of the COVID-19 declaration under Section 564(b)(1) of the Act, 21 U.S.C. section 360bbb-3(b)(1), unless the authorization is terminated or revoked sooner.  Performed at [facility], [address], [place], [place] [address]   Troponin I (High Sensitivity)     Status: Abnormal   Collection Time: 06/14/23  7:18 AM  Result Value Ref Range   Troponin I (High Sensitivity) 19 (H) <18 ng/L    Comment: (NOTE) Elevated high sensitivity troponin I (hsTnI) values and significant  changes across serial measurements may suggest ACS but many other  chronic  and acute conditions are known to  elevate hsTnI results.  Refer to the "Links" section for chest pain algorithms and additional  guidance. Performed at [facility], [address], [place], [place] [address]   Urinalysis, Routine w reflex microscopic -Urine, Clean Catch     Status: Abnormal   Collection Time: 06/15/23  9:34 AM  Result Value Ref Range   Color, Urine AMBER (A) YELLOW    Comment: BIOCHEMICALS MAY BE AFFECTED BY COLOR   APPearance CLOUDY (A) CLEAR   Specific Gravity, Urine 1.011 1.005 - 1.030   pH 8.0 5.0 - 8.0   Glucose, UA NEGATIVE NEGATIVE mg/dL   Hgb urine dipstick MODERATE (A) NEGATIVE   Bilirubin Urine NEGATIVE NEGATIVE   Ketones, ur 5 (A) NEGATIVE mg/dL   Protein, ur 191 (A) NEGATIVE mg/dL   Nitrite POSITIVE (A) NEGATIVE   Leukocytes,Ua MODERATE (A) NEGATIVE   RBC / HPF 11-20 0 - 5 RBC/hpf   WBC, UA >50 0 - 5 WBC/hpf   Bacteria, UA MANY (A) NONE SEEN   Squamous Epithelial / HPF 0-5 0 - 5 /HPF   WBC Clumps PRESENT    Mucus PRESENT    Budding Yeast PRESENT    Hyaline Casts, UA PRESENT    Triple Phosphate Crystal PRESENT    Non Squamous Epithelial 0-5 (A) NONE SEEN    Comment: Performed at [facility], [address] [place], [place] [address]   DG Chest Port 1 View  Result Date: 06/14/2023 CLINICAL DATA:  89 year old female with history of shortness of breath. EXAM: PORTABLE CHEST 1 VIEW COMPARISON:  Chest x-ray 06/10/2023. FINDINGS: Lung volumes are normal. No consolidative airspace disease. Mild blunting of the costophrenic sulci may suggest chronic pleuroparenchymal scarring although trace bilateral pleural effusions are not excluded. No pneumothorax. No evidence of pulmonary edema. Heart size is mildly enlarged. Retrocardiac lucency indicative of a large hiatal hernia. Upper mediastinal contours are within normal limits. Electronic device projecting over the left hemithorax, presumably an implantable loop recorder. IMPRESSION: 1. Possible trace bilateral pleural effusions versus chronic  basilar pleuroparenchymal scarring. No other acute findings. 2. Cardiomegaly. 3. Large hiatal hernia. Electronically Signed   By: Trudie Reed M.D.   On: 06/14/2023 05:50    Pending Labs Unresulted Labs (From admission, onward)     Start     Ordered   06/15/23 1119  CBC  Once,   STAT        06/15/23 1118   06/15/23 1119  Renal function panel  Once,   URGENT        06/15/23 1118   06/15/23 1119  Culture, blood (Routine X 2) w Reflex to ID Panel  BLOOD CULTURE X 2,   R (with TIMED occurrences)      06/15/23 1119   06/15/23 0849  Urine Culture  Once,   URGENT       Question:  Indication  Answer:  Dysuria   06/15/23 0848            Vitals/Pain Today's Vitals   06/15/23 0908 06/15/23 0922 06/15/23 1040 06/15/23 1103  BP: (!) 140/56 (!) 140/56    Pulse:  100    Resp:  17    Temp:  (!) 101 F (38.3 C) 99 F (37.2 C)   TempSrc:  Rectal Oral   SpO2:  97%    Weight:      Height:      PainSc:    7     Isolation Precautions Airborne and  Contact precautions  Medications Medications  rosuvastatin (CRESTOR) tablet 40 mg (40 mg Oral Given 06/15/23 0909)  torsemide (DEMADEX) tablet 20 mg (20 mg Oral Given 06/15/23 0908)  potassium chloride (KLOR-CON) packet 20 mEq (20 mEq Oral Given 06/15/23 0908)  Rivaroxaban (XARELTO) tablet 15 mg (15 mg Oral Given 06/14/23 1733)  pantoprazole (PROTONIX) EC tablet 40 mg (40 mg Oral Given 06/15/23 0908)  metoprolol succinate (TOPROL-XL) 24 hr tablet 12.5 mg (12.5 mg Oral Given 06/15/23 0909)  hydrALAZINE (APRESOLINE) tablet 25 mg (25 mg Oral Given 06/15/23 0908)  aspirin EC tablet 81 mg (81 mg Oral Given 06/15/23 0908)  Muscle Rub CREA (has no administration in time range)  cefTRIAXone (ROCEPHIN) 1 g in sodium chloride 0.9 % 100 mL IVPB (has no administration in time range)  sodium chloride flush (NS) 0.9 % injection 3 mL (3 mLs Intravenous Given 06/15/23 1203)  sodium chloride flush (NS) 0.9 % injection 3 mL (3 mLs Intravenous Given 06/15/23 1203)   sodium chloride flush (NS) 0.9 % injection 3 mL (has no administration in time range)  0.9 %  sodium chloride infusion (has no administration in time range)  acetaminophen (TYLENOL) tablet 650 mg (has no administration in time range)    Or  acetaminophen (TYLENOL) suppository 650 mg (has no administration in time range)  traZODone (DESYREL) tablet 50 mg (has no administration in time range)  polyethylene glycol (MIRALAX / GLYCOLAX) packet 17 g (has no administration in time range)  bisacodyl (DULCOLAX) suppository 10 mg (has no administration in time range)  ondansetron (ZOFRAN) tablet 4 mg (has no administration in time range)    Or  ondansetron (ZOFRAN) injection 4 mg (has no administration in time range)  hydrALAZINE (APRESOLINE) injection 10 mg (has no administration in time range)  cefTRIAXone (ROCEPHIN) 2 g in sodium chloride 0.9 % 100 mL IVPB (0 g Intravenous Stopped 06/15/23 1203)    Mobility walks with device     Focused Assessments Sepsis, UTI   R Recommendations: See Admitting Provider Note  Report given to:   Additional Notes: pt initially set for d/c to SNF/ daughter wants pt d/c home with [facility] rehab/PT, social worker updated

## 2023-06-15 NOTE — TOC Progression Note (Addendum)
Transition of Care [facility]) - Progression Note    Patient Details  Name: [name] MRN: [id number] Date of Birth: 06/10/1934  Transition [facility]) CM/SW Contact  Leitha Bleak, RN Phone Number: 06/15/2023, 10:13 AM  Clinical Narrative:   Patient has no bed offers, TOC expanding the search, TOC following.  Addendum: Patient has UTI, with fever, being admitted. Per RN daughter is Charity fundraiser at [employer], she wanted CIR for her mother. MD explained she does not qualify for CIR and explained SNF vs Nursing home. TOC to discuss bed offers when they come in.      Barriers to Discharge: SNF Pending bed offer, Insurance Authorization  Expected Discharge Plan and Services       Social Determinants of Health (SDOH) Interventions SDOH Screenings   Food Insecurity: No Food Insecurity (06/10/2023)  Housing: Low Risk  (06/10/2023)  Transportation Needs: No Transportation Needs (06/10/2023)  Utilities: Not At Risk (06/10/2023)  Depression (PHQ2-9): Low Risk  (05/03/2023)  Financial Resource Strain: Low Risk  (03/07/2023)  Physical Activity: Unknown (01/27/2023)  Social Connections: Unknown (01/27/2023)  Stress: No Stress Concern Present (01/27/2023)  Tobacco Use: Low Risk  (06/14/2023)    Readmission Risk Interventions    06/12/2023   10:22 AM 03/25/2023    3:24 PM 08/21/2022   12:03 PM  Readmission Risk Prevention Plan  Post Dischage Appt  Complete   Medication Screening  Complete   Transportation Screening Complete Complete Complete  PCP or Specialist Appt within 5-7 Days Complete    Home Care Screening Complete  Complete  Medication Review (RN CM) Referral to Pharmacy  Complete

## 2023-06-16 DIAGNOSIS — G9341 Metabolic encephalopathy: Secondary | ICD-10-CM

## 2023-06-16 MED ORDER — MIRTAZAPINE 15 MG PO TABS
7.5000 mg | ORAL_TABLET | Freq: Every day | ORAL | Status: DC
  Administered 2023-06-16 – 2023-06-17 (×2): 7.5 mg via ORAL
  Filled 2023-06-16 (×2): qty 1

## 2023-06-16 NOTE — Progress Notes (Signed)
PROGRESS NOTE     [name], is a 89 y.o. female, DOB - 06-15-1934, OZD:[id number]  Admit date - 06/14/2023   Admitting Physician Salah Burlison Mariea Clonts, MD  Outpatient Primary MD for the patient is [name], MD  LOS - 1  Chief Complaint  Patient presents with   Weakness        Brief Narrative:   89 y.o. female with Pmhx of HFpEF (LVEF 65-70% July 2024), NSTEMI 2018, CAD s/p stent placement, CVA (2023), CKD stage IIIb, PAfib, HLD, , HTN admitted on 06/15/2023 with acute metabolic encephalopathy and generalized weakness in the setting of E. coli UTI   -Assessment and Plan: 1) acute metabolic encephalopathy--- secondary to E. coli UTI -Mentation improving according to parent members at bedside -  2) E. coli UTI----final ID and sensitivities pending -Continue IV Rocephin -Blood cultures with GPC's most likely contaminant   3)Generalized weakness and deconditioning--patient with significant mobility related challenges---most likely due to E. coli UTI infection -Patient was found to be weak prior to discharge evaluated by physical therapy patient and family declined SNF placement on 06/12/2023 prior to discharge from inpatient service [facility] -When patient arrived home on 06/12/2023 required wheelchair to get from the car into the house she could not ambulate -When she woke up on 06/14/2023 she had significant difficulties with mobility related ADLs- -Pt is unable to perform mobility related ADLs -PT eval requested   4)CAD s/p DES x2 in 2011 NSTEMI s/p DES x1 in 2018 Mixed HLD Hx CVA 08/2022 Patient with extensive history of NSTEMI, stents, and stroke.  Continued PTA aspirin, Crestor and metoprolol    5)CKD stage IIIb Secondary hyperparathyroidism -creatinine is 1.51 down from 1.83 on 06/14/2023 baseline creatinine usually around 1.2-1.3   6)PAFib-continue Xarelto for stroke prophylaxis and metoprolol for rate control History of temporary pacemaker placement 2018, last  interrogated August 2024. -   7)HTN -Continue metoprolol and hydralazine   8) chronic anemia--multifactorial in the setting of CKD and chronic Xarelto and aspirin use -Hgb is close to baseline -No bleeding concerns at this time -Monitor closely   9) insomnia and anorexia--- patient and family agreed to trial of Remeron   10)chronic HFpEF/chronic diastolic dysfunction CHF/chronic bilateral lower extremity edema -Torsemide on hold due to increased creatinine on 06/14/2023 Reports frequent UTIs so therefore would not be a great candidate for SGLT2  -Continue metoprolol -Restart torsemide when feasible -TED stockings advised   11)Social/Ethics--Patient's son Timothy/TJ is at bedside -[name] Dois Davenport also visiting -[name] Tobi Bastos is on the phone -Questions answered -Patient remains a full code  12)Morbid Obesity- -Low calorie diet, portion control and increase physical activity discussed with patient -Body mass index is 33.2 kg/m.  Status is: Inpatient   Disposition: The patient is from: Home              Anticipated d/c is to: SNF              Anticipated d/c date is: 1 day              Patient currently is not medically stable to d/c. Barriers: Not Clinically Stable-   Code Status :  -  Code Status: Full Code   Family Communication:    (patient is alert, awake and coherent)  -Discussed with son Marcial Pacas and daughters Tobi Bastos and Dois Davenport at bedside  DVT Prophylaxis  :   - SCDs   Place TED hose Start: 06/15/23 1237 SCDs Start: 06/15/23 1123 Place TED hose Start:  06/15/23 1123 Rivaroxaban (XARELTO) tablet 15 mg   Lab Results  Component Value Date   PLT 330 06/15/2023   Inpatient Medications  Scheduled Meds:  aspirin EC  81 mg Oral Daily   hydrALAZINE  25 mg Oral BID   metoprolol succinate  12.5 mg Oral Daily   mirtazapine  7.5 mg Oral QHS   pantoprazole  40 mg Oral Daily   potassium chloride  20 mEq Oral Daily   Rivaroxaban  15 mg Oral Q supper    rosuvastatin  40 mg Oral Daily   sodium chloride flush  3 mL Intravenous Q12H   sodium chloride flush  3 mL Intravenous Q12H   torsemide  20 mg Oral Daily   Continuous Infusions:  sodium chloride     cefTRIAXone (ROCEPHIN)  IV 1 g (06/16/23 1047)   PRN Meds:.sodium chloride, acetaminophen **OR** acetaminophen, bisacodyl, hydrALAZINE, Muscle Rub, ondansetron **OR** ondansetron (ZOFRAN) IV, polyethylene glycol, sodium chloride flush, traZODone   Anti-infectives (From admission, onward)    Start     Dose/Rate Route Frequency Ordered Stop   06/16/23 1000  cefTRIAXone (ROCEPHIN) 1 g in sodium chloride 0.9 % 100 mL IVPB        1 g 200 mL/hr over 30 Minutes Intravenous Every 24 hours 06/15/23 1121 06/19/23 0959   06/15/23 1130  cefTRIAXone (ROCEPHIN) 1 g in sodium chloride 0.9 % 100 mL IVPB  Status:  Discontinued        1 g 200 mL/hr over 30 Minutes Intravenous Every 24 hours 06/15/23 1120 06/15/23 1121   06/15/23 1030  cefTRIAXone (ROCEPHIN) 2 g in sodium chloride 0.9 % 100 mL IVPB        2 g 200 mL/hr over 30 Minutes Intravenous  Once 06/15/23 1025 06/15/23 1203       Subjective: [name] today has no fevers, no emesis,  No chest pain,   - Patient's son Timothy/TJ is at bedside -[name] Dois Davenport also visiting -[name] Tobi Bastos is on the phone -Questions answered  Objective: Vitals:   06/16/23 0042 06/16/23 0618 06/16/23 0930 06/16/23 1413  BP: 126/73 (!) 127/58  (!) 147/58  Pulse: 90 90  82  Resp:    (!) 22  Temp: 99.3 F (37.4 C) 99.7 F (37.6 C)  98.8 F (37.1 C)  TempSrc: Oral Oral  Oral  SpO2: 95% 99% 97% 96%  Weight:      Height:        Intake/Output Summary (Last 24 hours) at 06/16/2023 1844 Last data filed at 06/16/2023 1336 Gross per 24 hour  Intake 240 ml  Output 950 ml  Net -710 ml   Filed Weights   06/14/23 0432  Weight: 90.5 kg    Physical Exam  Gen:- Awake Alert, in no acute distress HEENT:- [place].AT, No sclera  icterus Neck-Supple Neck,No JVD,.  Lungs-  CTAB , fair symmetrical air movement CV- S1, S2 normal, regular  Abd-  +ve B.Sounds, Abd Soft, No tenderness, no CVA area tenderness Extremity/Skin:- No  edema, pedal pulses present  Psych-affect is appropriate, oriented x3 Neuro-generalized weakness no new focal deficits, no tremors  Data Reviewed: I have personally reviewed following labs and imaging studies  CBC: Recent Labs  Lab 06/09/23 2319 06/11/23 0434 06/12/23 0431 06/14/23 0520 06/15/23 1209  WBC 10.3 8.3 7.6 9.5 10.0  NEUTROABS 6.8  --   --   --   --   HGB 11.1* 11.4* 10.7* 10.8* 10.6*  HCT 35.5* 36.0 32.1* 34.2* 33.6*  MCV  92.4 92.5 90.7 91.4 91.8  PLT 278 283 257 307 330   Basic Metabolic Panel: Recent Labs  Lab 06/09/23 2319 06/11/23 0434 06/12/23 0431 06/14/23 0520 06/15/23 1209  NA 138 134* 135 135 134*  K 3.7 3.5 3.2* 3.8 3.9  CL 106 99 100 99 101  CO2 24 23 25 25 24   GLUCOSE 131* 113* 120* 117* 96  BUN 15 16 18  31* 27*  CREATININE 1.24* 1.32* 1.38* 1.83* 1.51*  CALCIUM 8.2* 8.1* 7.9* 8.3* 8.0*  MG  --  1.4* 2.0 1.9  --   PHOS  --   --   --   --  3.4   GFR: Estimated Creatinine Clearance: 29.7 mL/min (A) (by C-G formula based on SCr of 1.51 mg/dL (H)). Liver Function Tests: Recent Labs  Lab 06/09/23 2319 06/14/23 0520 06/15/23 1209  AST 24 27  --   ALT 16 12  --   ALKPHOS 62 64  --   BILITOT 0.6 0.5  --   PROT 5.7* 6.0*  --   ALBUMIN 2.4* 2.3* 2.0*   Recent Results (from the past 240 hour(s))  SARS Coronavirus 2 by RT PCR (hospital order, performed in [facility] hospital lab) *cepheid single result test* Anterior Nasal Swab     Status: None   Collection Time: 06/14/23  6:10 AM   Specimen: Anterior Nasal Swab  Result Value Ref Range Status   SARS Coronavirus 2 by RT PCR NEGATIVE NEGATIVE Final    Comment: (NOTE) SARS-CoV-2 target nucleic acids are NOT DETECTED.  The SARS-CoV-2 RNA is generally detectable in upper and lower respiratory  specimens during the acute phase of infection. The lowest concentration of SARS-CoV-2 viral copies this assay can detect is 250 copies / mL. A negative result does not preclude SARS-CoV-2 infection and should not be used as the sole basis for treatment or other patient management decisions.  A negative result may occur with improper specimen collection / handling, submission of specimen other than nasopharyngeal swab, presence of viral mutation(s) within the areas targeted by this assay, and inadequate number of viral copies (<250 copies / mL). A negative result must be combined with clinical observations, patient history, and epidemiological information.  Fact Sheet for Patients:   RoadLapTop.co.za  Fact Sheet for Healthcare Providers: http://kim-miller.com/  This test is not yet approved or  cleared by the [place] FDA and has been authorized for detection and/or diagnosis of SARS-CoV-2 by FDA under an Emergency Use Authorization (EUA).  This EUA will remain in effect (meaning this test can be used) for the duration of the COVID-19 declaration under Section 564(b)(1) of the Act, 21 U.S.C. section 360bbb-3(b)(1), unless the authorization is terminated or revoked sooner.  Performed at [facility], [address], [place], [place] [address]   Urine Culture     Status: Abnormal (Preliminary result)   Collection Time: 06/15/23  9:34 AM   Specimen: Urine, Clean Catch  Result Value Ref Range Status   Specimen Description   Final    URINE, CLEAN CATCH Performed at [facility], [address], [place], [place] [address]    Special Requests   Final    NONE Performed at [facility], [address] [place], [place] [address]    Culture (A)  Final    >=100,000 COLONIES/mL ESCHERICHIA COLI CULTURE REINCUBATED FOR BETTER GROWTH SUSCEPTIBILITIES TO FOLLOW Performed at [facility], [address] [place], [place] [address]     Report Status PENDING  Incomplete  Culture, blood (Routine X 2) w Reflex to ID Panel     Status: None (Preliminary result)   Collection Time: 06/15/23 12:09 PM   Specimen: Right Antecubital; Blood  Result Value Ref Range Status   Specimen Description   Final    RIGHT ANTECUBITAL BOTTLES DRAWN AEROBIC AND ANAEROBIC   Special Requests Blood Culture adequate volume  Final   Culture   Final    NO GROWTH < 24 HOURS Performed at [facility], [address], [place], [place] [address]    Report Status PENDING  Incomplete  Culture, blood (Routine X 2) w Reflex to ID Panel     Status: None (Preliminary result)   Collection Time: 06/15/23 12:09 PM   Specimen: Left Antecubital; Blood  Result Value Ref Range Status   Specimen Description   Final    LEFT ANTECUBITAL BOTTLES DRAWN AEROBIC AND ANAEROBIC   Special Requests Blood Culture adequate volume  Final   Culture  Setup Time   Final    GRAM POSITIVE COCCI ANAEROBIC BOTTLE ONLY Gram Stain Report Called to,Read Back By and Verified With: D TUCHMAN AT 1238 ON [id number] BY S DALTON Performed at [facility], [address] [place], [place] [address]    Culture [facility] POSITIVE COCCI  Final   Report Status PENDING  Incomplete     Radiology Studies: No results found.  Scheduled Meds:  aspirin EC  81 mg Oral Daily   hydrALAZINE  25 mg Oral BID   metoprolol succinate  12.5 mg Oral Daily   mirtazapine  7.5 mg Oral QHS   pantoprazole  40 mg Oral Daily   potassium chloride  20 mEq Oral Daily   Rivaroxaban  15 mg Oral Q supper   rosuvastatin  40 mg Oral Daily   sodium chloride flush  3 mL Intravenous Q12H   sodium chloride flush  3 mL Intravenous Q12H   torsemide  20 mg Oral Daily   Continuous Infusions:  sodium chloride     cefTRIAXone (ROCEPHIN)  IV 1 g (06/16/23 1047)    LOS: 1 day   Shon Hale M.D on 06/16/2023 at 6:44 PM  Go to [email or web address] - for contact info  [facility] - Office  [phone]  If 7PM-7AM, please  contact night-coverage [email or web address] 06/16/2023, 6:44 PM

## 2023-06-17 DIAGNOSIS — G9341 Metabolic encephalopathy: Secondary | ICD-10-CM | POA: Diagnosis not present

## 2023-06-17 LAB — URINE CULTURE: Culture: >=100000 — AB

## 2023-06-17 LAB — BLOOD CULTURE ID PANEL (REFLEXED) - BCID2

## 2023-06-17 LAB — CBC
HCT: 33 % — ABNORMAL LOW (ref 36.0–46.0)
Hemoglobin: 10.2 g/dL — ABNORMAL LOW (ref 12.0–15.0)
MCH: 28.7 pg (ref 26.0–34.0)
MCHC: 30.9 g/dL (ref 30.0–36.0)
MCV: 93 fL (ref 80.0–100.0)
Platelets: 299 10*3/uL (ref 150–400)
RBC: 3.55 MIL/uL — ABNORMAL LOW (ref 3.87–5.11)
RDW: 15.9 % — ABNORMAL HIGH (ref 11.5–15.5)
WBC: 8 10*3/uL (ref 4.0–10.5)
nRBC: 0 % (ref 0.0–0.2)

## 2023-06-17 LAB — BASIC METABOLIC PANEL
Anion gap: 9 (ref 5–15)
BUN: 27 mg/dL — ABNORMAL HIGH (ref 8–23)
CO2: 26 mmol/L (ref 22–32)
Calcium: 8 mg/dL — ABNORMAL LOW (ref 8.9–10.3)
Chloride: 99 mmol/L (ref 98–111)
Creatinine, Ser: 1.76 mg/dL — ABNORMAL HIGH (ref 0.44–1.00)
GFR, Estimated: 28 mL/min — ABNORMAL LOW (ref >60)
Glucose, Bld: 89 mg/dL (ref 70–99)
Potassium: 3.7 mmol/L (ref 3.5–5.1)
Sodium: 134 mmol/L — ABNORMAL LOW (ref 135–145)

## 2023-06-17 MED ORDER — SODIUM CHLORIDE 0.9 % IV SOLN
1.0000 g | Freq: Two times a day (BID) | INTRAVENOUS | Status: DC
  Administered 2023-06-17 – 2023-06-20 (×7): 1 g via INTRAVENOUS
  Filled 2023-06-17 (×7): qty 20

## 2023-06-17 NOTE — Progress Notes (Signed)
Mobility Specialist Progress Note:    06/17/23 1005  Mobility  Activity Stood at bedside  Level of Assistance Maximum assist, patient does 25-49%  Assistive Device Front wheel walker  Range of Motion/Exercises Active;Right arm;Left arm;Active Assistive;Left leg;Right leg  Activity Response Tolerated well  Mobility Referral Yes  $Mobility charge 1 Mobility  Mobility Specialist Start Time (ACUTE ONLY) 1005  Mobility Specialist Stop Time (ACUTE ONLY) 1025  Mobility Specialist Time Calculation (min) (ACUTE ONLY) 20 min   Pt received in bed, family in room. Agreeable to mobility, NT assisted with standing pt up. Required MaxA +2 to stand with RW, deferred ambulation d/t weakness and fear of falling. Tolerated well, pt unsteady when standing. Returned pt supine, bed alarm on. Call bell in reach, all needs met.   Lawerance Bach Mobility Specialist Please contact via Special educational needs teacher or  Rehab office at [phone]

## 2023-06-17 NOTE — Care Management Important Message (Signed)
Important Message  Patient Details  Name: [name] MRN: [id number] Date of Birth: Nov 17, 1933   Important Message Given:  N/A - LOS <3 / Initial given by admissions     Corey Harold 06/17/2023, 10:42 AM

## 2023-06-17 NOTE — Plan of Care (Signed)
  Problem: Self-Care: Goal: Ability to participate in self-care as condition permits will improve Outcome: Progressing   Problem: Nutrition: Goal: Dietary intake will improve Outcome: Progressing

## 2023-06-17 NOTE — Plan of Care (Signed)
  Problem: Coping: Goal: Will identify appropriate support needs Outcome: Progressing   Problem: Health Behavior/Discharge Planning: Goal: Goals will be collaboratively established with patient/family Outcome: Progressing   Problem: Self-Care: Goal: Ability to communicate needs accurately will improve Outcome: Progressing   Problem: Health Behavior/Discharge Planning: Goal: Ability to manage health-related needs will improve Outcome: Progressing

## 2023-06-17 NOTE — Progress Notes (Signed)
Carelink Summary Report / Loop Recorder 

## 2023-06-17 NOTE — Progress Notes (Signed)
PROGRESS NOTE     [name], is a 89 y.o. female, DOB - 21-Sep-1933, FTD:[id number]  Admit date - 06/14/2023   Admitting Physician [name] Thielke Mariea Clonts, MD  Outpatient Primary MD for the patient is [name] Lade, MD  LOS - 2  Chief Complaint  Patient presents with   Weakness     Brief Narrative:   89 y.o. female with Pmhx of HFpEF (LVEF 65-70% July 2024), NSTEMI 2018, CAD s/p stent placement, CVA (2023), CKD stage IIIb, PAfib, HLD, , HTN admitted on 06/15/2023 with acute metabolic encephalopathy and generalized weakness in the setting of E. coli UTI   -Assessment and Plan: 1) acute metabolic encephalopathy--- secondary to E. coli UTI -Mentation is back to baseline according to patient's son and daughters-  - 2)ESBL  E. coli UTI---- -stop IV Rocephin on 06/17/23 -Start IV meropenem on 06/17/23  -Blood cultures with staph epi most likely contaminant   3)Generalized weakness and Deconditioning--patient with significant mobility related challenges---due to ESBL E. coli UTI infection -Patient was found to be weak prior to discharge evaluated by physical therapy patient and family declined SNF placement on 06/12/2023 prior to discharge from inpatient service [facility] -When patient arrived home on 06/12/2023 required wheelchair to get from the car into the house she could not ambulate -When she woke up on 06/14/2023 she had significant difficulties with mobility related ADLs- -Pt is unable to perform mobility related ADLs -PT eval appreciated recommends SNF rehab   4)CAD s/p DES x2 in 2011 NSTEMI s/p DES x1 in 2018 Mixed HLD Hx CVA 08/2022 Patient with extensive history of NSTEMI, stents, and stroke.  Continued PTA aspirin, Crestor and metoprolol    5)CKD stage IIIb Secondary hyperparathyroidism -Creatinine trending up  baseline creatinine usually around 1.2-1.3   6)PAFib-continue Xarelto for stroke prophylaxis and metoprolol for rate control History of temporary pacemaker  placement 2018, last interrogated August 2024. -   7)HTN -Continue metoprolol and hydralazine   8) chronic anemia--multifactorial in the setting of CKD and chronic Xarelto and aspirin use -Hgb is close to baseline -No bleeding concerns at this time -Monitor closely   9) insomnia and anorexia----doing well with low-dose Remeron at this time   10)chronic HFpEF/chronic diastolic dysfunction CHF/chronic bilateral lower extremity edema -Torsemide on hold due to increased creatinine on 06/14/2023 Reports frequent UTIs so therefore would not be a great candidate for SGLT2  -Continue metoprolol -Restart torsemide when feasible -TED stockings advised   11)Social/Ethics--Patient's son [name]/[name] is at bedside -[name] [name] also visiting -[name] [name] is on the phone -Questions answered -Patient remains a full code  12)Morbid Obesity- -Low calorie diet, portion control and increase physical activity discussed with patient -Body mass index is 33.2 kg/m.  Status is: Inpatient   Disposition: The patient is from: Home              Anticipated d/c is to: SNF              Anticipated d/c date is: 1 day              Patient currently is not medically stable to d/c. Barriers: Not Clinically Stable-   Code Status :  -  Code Status: Full Code   Family Communication:    (patient is alert, awake and coherent)  -Discussed with son [name] and daughters [name] and [name] at bedside  DVT Prophylaxis  :   - SCDs   Place TED hose Start: 06/15/23 1237 SCDs Start: 06/15/23 1123 Place  TED hose Start: 06/15/23 1123 Rivaroxaban (XARELTO) tablet 15 mg   Lab Results  Component Value Date   PLT 299 06/17/2023   Inpatient Medications  Scheduled Meds:  aspirin EC  81 mg Oral Daily   hydrALAZINE  25 mg Oral BID   metoprolol succinate  12.5 mg Oral Daily   mirtazapine  7.5 mg Oral QHS   pantoprazole  40 mg Oral Daily   potassium chloride  20 mEq Oral Daily   Rivaroxaban  15  mg Oral Q supper   rosuvastatin  40 mg Oral Daily   sodium chloride flush  3 mL Intravenous Q12H   sodium chloride flush  3 mL Intravenous Q12H   torsemide  20 mg Oral Daily   Continuous Infusions:  sodium chloride     meropenem (MERREM) IV 1 g (06/17/23 1412)   PRN Meds:.sodium chloride, acetaminophen **OR** acetaminophen, bisacodyl, hydrALAZINE, Muscle Rub, ondansetron **OR** ondansetron (ZOFRAN) IV, polyethylene glycol, sodium chloride flush, traZODone   Anti-infectives (From admission, onward)    Start     Dose/Rate Route Frequency Ordered Stop   06/17/23 1400  meropenem (MERREM) 1 g in sodium chloride 0.9 % 100 mL IVPB        1 g 200 mL/hr over 30 Minutes Intravenous Every 12 hours 06/17/23 1224     06/16/23 1000  cefTRIAXone (ROCEPHIN) 1 g in sodium chloride 0.9 % 100 mL IVPB  Status:  Discontinued        1 g 200 mL/hr over 30 Minutes Intravenous Every 24 hours 06/15/23 1121 06/17/23 1224   06/15/23 1130  cefTRIAXone (ROCEPHIN) 1 g in sodium chloride 0.9 % 100 mL IVPB  Status:  Discontinued        1 g 200 mL/hr over 30 Minutes Intravenous Every 24 hours 06/15/23 1120 06/15/23 1121   06/15/23 1030  cefTRIAXone (ROCEPHIN) 2 g in sodium chloride 0.9 % 100 mL IVPB        2 g 200 mL/hr over 30 Minutes Intravenous  Once 06/15/23 1025 06/15/23 1203       Subjective: [name] today has no fevers, no emesis,  No chest pain,   - -Slept better last night -Eat better for breakfast -Son [name] at bedside, questions answered  Objective: Vitals:   06/16/23 2308 06/17/23 0349 06/17/23 0957 06/17/23 1336  BP: 128/65 129/77 131/74 (!) 125/96  Pulse: 78 84 88 84  Resp: 18 18 19 14   Temp:  98.3 F (36.8 C) 98.2 F (36.8 C)   TempSrc:  Oral Oral   SpO2: 97% 97% 98% 98%  Weight:      Height:        Intake/Output Summary (Last 24 hours) at 06/17/2023 1709 Last data filed at 06/17/2023 0500 Gross per 24 hour  Intake 100 ml  Output 450 ml  Net -350 ml   Filed Weights    06/14/23 0432  Weight: 90.5 kg    Physical Exam  Gen:- Awake Alert, in no acute distress HEENT:- [place].AT, No sclera icterus Neck-Supple Neck,No JVD,.  Lungs-  CTAB , fair symmetrical air movement CV- S1, S2 normal, regular  Abd-  +ve B.Sounds, Abd Soft, No tenderness, no CVA area tenderness Extremity/Skin:- No  edema, pedal pulses present  Psych-affect is appropriate, oriented x3 Neuro-generalized weakness no new focal deficits, no tremors  Data Reviewed: I have personally reviewed following labs and imaging studies  CBC: Recent Labs  Lab 06/11/23 0434 06/12/23 0431 06/14/23 0520 06/15/23 1209 06/17/23 0345  WBC 8.3 7.6  9.5 10.0 8.0  HGB 11.4* 10.7* 10.8* 10.6* 10.2*  HCT 36.0 32.1* 34.2* 33.6* 33.0*  MCV 92.5 90.7 91.4 91.8 93.0  PLT 283 257 307 330 299   Basic Metabolic Panel: Recent Labs  Lab 06/11/23 0434 06/12/23 0431 06/14/23 0520 06/15/23 1209 06/17/23 0345  NA 134* 135 135 134* 134*  K 3.5 3.2* 3.8 3.9 3.7  CL 99 100 99 101 99  CO2 23 25 25 24 26   GLUCOSE 113* 120* 117* 96 89  BUN 16 18 31* 27* 27*  CREATININE 1.32* 1.38* 1.83* 1.51* 1.76*  CALCIUM 8.1* 7.9* 8.3* 8.0* 8.0*  MG 1.4* 2.0 1.9  --   --   PHOS  --   --   --  3.4  --    GFR: Estimated Creatinine Clearance: 25.5 mL/min (A) (by C-G formula based on SCr of 1.76 mg/dL (H)). Liver Function Tests: Recent Labs  Lab 06/14/23 0520 06/15/23 1209  AST 27  --   ALT 12  --   ALKPHOS 64  --   BILITOT 0.5  --   PROT 6.0*  --   ALBUMIN 2.3* 2.0*   Recent Results (from the past 240 hour(s))  SARS Coronavirus 2 by RT PCR (hospital order, performed in [facility] hospital lab) *cepheid single result test* Anterior Nasal Swab     Status: None   Collection Time: 06/14/23  6:10 AM   Specimen: Anterior Nasal Swab  Result Value Ref Range Status   SARS Coronavirus 2 by RT PCR NEGATIVE NEGATIVE Final    Comment: (NOTE) SARS-CoV-2 target nucleic acids are NOT DETECTED.  The SARS-CoV-2 RNA is generally  detectable in upper and lower respiratory specimens during the acute phase of infection. The lowest concentration of SARS-CoV-2 viral copies this assay can detect is 250 copies / mL. A negative result does not preclude SARS-CoV-2 infection and should not be used as the sole basis for treatment or other patient management decisions.  A negative result may occur with improper specimen collection / handling, submission of specimen other than nasopharyngeal swab, presence of viral mutation(s) within the areas targeted by this assay, and inadequate number of viral copies (<250 copies / mL). A negative result must be combined with clinical observations, patient history, and epidemiological information.  Fact Sheet for Patients:   RoadLapTop.co.za  Fact Sheet for Healthcare Providers: http://kim-miller.com/  This test is not yet approved or  cleared by the [place] FDA and has been authorized for detection and/or diagnosis of SARS-CoV-2 by FDA under an Emergency Use Authorization (EUA).  This EUA will remain in effect (meaning this test can be used) for the duration of the COVID-19 declaration under Section 564(b)(1) of the Act, 21 U.S.C. section 360bbb-3(b)(1), unless the authorization is terminated or revoked sooner.  Performed at [facility], [address] [place], [place] [address]   Urine Culture     Status: Abnormal   Collection Time: 06/15/23  9:34 AM   Specimen: Urine, Clean Catch  Result Value Ref Range Status   Specimen Description   Final    URINE, CLEAN CATCH Performed at [facility], [address] [place], [place] [address]    Special Requests   Final    NONE Performed at [facility], [address], [place], [place] [address]    Culture (A)  Final    >=100,000 COLONIES/mL ESCHERICHIA COLI Confirmed Extended Spectrum Beta-Lactamase Producer (ESBL).  In bloodstream infections from ESBL organisms, carbapenems are  preferred over piperacillin/tazobactam. They are  shown to have a lower risk of mortality.    Report Status 06/17/2023 FINAL  Final   Organism ID, Bacteria ESCHERICHIA COLI (A)  Final      Susceptibility   Escherichia coli - MIC*    AMPICILLIN >=32 RESISTANT Resistant     CEFAZOLIN >=64 RESISTANT Resistant     CEFEPIME 16 RESISTANT Resistant     CEFTRIAXONE >=64 RESISTANT Resistant     CIPROFLOXACIN >=4 RESISTANT Resistant     GENTAMICIN <=1 SENSITIVE Sensitive     IMIPENEM <=0.25 SENSITIVE Sensitive     NITROFURANTOIN <=16 SENSITIVE Sensitive     TRIMETH/SULFA <=20 SENSITIVE Sensitive     AMPICILLIN/SULBACTAM 16 INTERMEDIATE Intermediate     PIP/TAZO 8 SENSITIVE Sensitive ug/mL    * >=100,000 COLONIES/mL ESCHERICHIA COLI  Culture, blood (Routine X 2) w Reflex to ID Panel     Status: None (Preliminary result)   Collection Time: 06/15/23 12:09 PM   Specimen: Right Antecubital; Blood  Result Value Ref Range Status   Specimen Description   Final    RIGHT ANTECUBITAL BOTTLES DRAWN AEROBIC AND ANAEROBIC   Special Requests Blood Culture adequate volume  Final   Culture   Final    NO GROWTH 2 DAYS Performed at [facility], [address] [place], [place] [address]    Report Status PENDING  Incomplete  Culture, blood (Routine X 2) w Reflex to ID Panel     Status: None (Preliminary result)   Collection Time: 06/15/23 12:09 PM   Specimen: Left Antecubital; Blood  Result Value Ref Range Status   Specimen Description   Final    LEFT ANTECUBITAL BOTTLES DRAWN AEROBIC AND ANAEROBIC Performed at [facility], [address], [place], [place] [address]    Special Requests   Final    Blood Culture adequate volume Performed at [facility], [address] [place], [place] [address]    Culture  Setup Time   Final    GRAM POSITIVE COCCI ANAEROBIC BOTTLE ONLY Gram Stain Report Called to,Read Back By and Verified With: D TUCHMAN AT 1238 ON [id number] BY S DALTON CRITICAL RESULT CALLED TO, READ  BACK BY AND VERIFIED WITH: S RICHARDSON RN 06/17/23 @ 0106 BY AB    Culture   Final    GRAM POSITIVE COCCI TOO YOUNG TO READ Performed at [facility], [address], [place], [place] [address]    Report Status PENDING  Incomplete  Blood Culture ID Panel (Reflexed)     Status: Abnormal   Collection Time: 06/15/23 12:09 PM  Result Value Ref Range Status   Enterococcus faecalis NOT DETECTED NOT DETECTED Final   Enterococcus Faecium NOT DETECTED NOT DETECTED Final   Listeria monocytogenes NOT DETECTED NOT DETECTED Final   Staphylococcus species DETECTED (A) NOT DETECTED Final    Comment: CRITICAL RESULT CALLED TO, READ BACK BY AND VERIFIED WITH: S RICHARDSON RN 06/17/23 @ 0106 BY AB    Staphylococcus aureus (BCID) NOT DETECTED NOT DETECTED Final   Staphylococcus epidermidis DETECTED (A) NOT DETECTED Final    Comment: Methicillin (oxacillin) resistant coagulase negative staphylococcus. Possible blood culture contaminant (unless isolated from more than one blood culture draw or clinical case suggests pathogenicity). No antibiotic treatment is indicated for blood  culture contaminants. CRITICAL RESULT CALLED TO, READ BACK BY AND VERIFIED WITH: S RICHARDSON RN 06/17/23 @ 0106 BY AB    Staphylococcus lugdunensis NOT DETECTED NOT DETECTED Final   Streptococcus species NOT DETECTED NOT DETECTED Final   Streptococcus  agalactiae NOT DETECTED NOT DETECTED Final   Streptococcus pneumoniae NOT DETECTED NOT DETECTED Final   Streptococcus pyogenes NOT DETECTED NOT DETECTED Final   A.calcoaceticus-baumannii NOT DETECTED NOT DETECTED Final   Bacteroides fragilis NOT DETECTED NOT DETECTED Final   Enterobacterales NOT DETECTED NOT DETECTED Final   Enterobacter cloacae complex NOT DETECTED NOT DETECTED Final   Escherichia coli NOT DETECTED NOT DETECTED Final   Klebsiella aerogenes NOT DETECTED NOT DETECTED Final   Klebsiella oxytoca NOT DETECTED NOT DETECTED Final   Klebsiella pneumoniae NOT  DETECTED NOT DETECTED Final   Proteus species NOT DETECTED NOT DETECTED Final   Salmonella species NOT DETECTED NOT DETECTED Final   Serratia marcescens NOT DETECTED NOT DETECTED Final   Haemophilus influenzae NOT DETECTED NOT DETECTED Final   Neisseria meningitidis NOT DETECTED NOT DETECTED Final   Pseudomonas aeruginosa NOT DETECTED NOT DETECTED Final   Stenotrophomonas maltophilia NOT DETECTED NOT DETECTED Final   Candida albicans NOT DETECTED NOT DETECTED Final   Candida auris NOT DETECTED NOT DETECTED Final   Candida glabrata NOT DETECTED NOT DETECTED Final   Candida krusei NOT DETECTED NOT DETECTED Final   Candida parapsilosis NOT DETECTED NOT DETECTED Final   Candida tropicalis NOT DETECTED NOT DETECTED Final   Cryptococcus neoformans/gattii NOT DETECTED NOT DETECTED Final   Methicillin resistance mecA/C DETECTED (A) NOT DETECTED Final    Comment: CRITICAL RESULT CALLED TO, READ BACK BY AND VERIFIED WITH: S RICHARDSON RN 06/17/23 @ 0106 BY AB Performed at [facility], [address], [place], [place] [address]     Radiology Studies: No results found.  Scheduled Meds:  aspirin EC  81 mg Oral Daily   hydrALAZINE  25 mg Oral BID   metoprolol succinate  12.5 mg Oral Daily   mirtazapine  7.5 mg Oral QHS   pantoprazole  40 mg Oral Daily   potassium chloride  20 mEq Oral Daily   Rivaroxaban  15 mg Oral Q supper   rosuvastatin  40 mg Oral Daily   sodium chloride flush  3 mL Intravenous Q12H   sodium chloride flush  3 mL Intravenous Q12H   torsemide  20 mg Oral Daily   Continuous Infusions:  sodium chloride     meropenem (MERREM) IV 1 g (06/17/23 1412)    LOS: 2 days   Shon Hale M.D on 06/17/2023 at 5:09 PM  Go to [email or web address] - for contact info  [facility] - Office  [phone]  If 7PM-7AM, please contact night-coverage [email or web address] 06/17/2023, 5:09 PM

## 2023-06-17 NOTE — TOC Progression Note (Addendum)
Transition of Care [facility]) - Progression Note    Patient Details  Name: [name] MRN: [id number] Date of Birth: [birth year]  Transition of Care [facility] CM/SW Contact  Villa Herb, [place] Phone Number: 06/17/2023, 1:27 PM  Clinical Narrative:    CSW spoke with pts daughter Tobi Bastos this morning to review bed offers. Pts daughter requested that CSW follow up with [facility] as it is closest facility, if [facility] did not have a bed pts daughter stated she would consider [facility]. CSW spoke to [place] in admissions at [facility] who states that they do have a bed and can offer SNF bed to pt.   CSW updated pts daughter of this. Pts daughter states she would like to visit both facilities. CSW explained that pt is medically ready for SNF placement and that insurance auth cannot be started until a facility is chosen so we will need to know their facility preference as soon as possible. CSW explained that Medicare.gov and google reviews could be reviewed as well for facilities reviews and ratings. TOC to follow.  Addendum 2:40pm: CSW updated by pts daughter that pt and family accept bed offer at [facility]. CSW updated admissions at [facility] of bed choice. TOC to start insurance auth at this time. TOC to follow.    Barriers to Discharge: SNF Pending bed offer, Insurance Authorization  Expected Discharge Plan and Services                                               Social Determinants of Health (SDOH) Interventions SDOH Screenings   Food Insecurity: No Food Insecurity (06/10/2023)  Housing: Low Risk  (06/10/2023)  Transportation Needs: No Transportation Needs (06/10/2023)  Utilities: Not At Risk (06/10/2023)  Depression (PHQ2-9): Low Risk  (05/03/2023)  Financial Resource Strain: Low Risk  (03/07/2023)  Physical Activity: Unknown (01/27/2023)  Social Connections: Unknown (01/27/2023)  Stress: No Stress Concern Present (01/27/2023)  Tobacco Use: Low Risk  (06/14/2023)    Readmission Risk  Interventions    06/12/2023   10:22 AM 03/25/2023    3:24 PM 08/21/2022   12:03 PM  Readmission Risk Prevention Plan  Post Dischage Appt  Complete   Medication Screening  Complete   Transportation Screening Complete Complete Complete  PCP or Specialist Appt within 5-7 Days Complete    Home Care Screening Complete  Complete  Medication Review (RN CM) Referral to Pharmacy  Complete

## 2023-06-17 NOTE — Progress Notes (Signed)
Physical Therapy Treatment Patient Details Name: [name] MRN: [id number] DOB: November 15, 1933 Today's Date: 06/17/2023   History of Present Illness History of Present Illness              [name] is a 89 y.o. year-old female with a history of CAD, CKD, CHF presenting to the ED with chief complaint of weakness.     Recent admission to the hospital was lower extremity edema, required diuresis.  Was doing better for a day or 2 but is now worse again.  Generalized weakness, not really able to get out of bed on her own.  Endorsing occasional shortness of breath, the legs are better than they were during the hospitalization but are still a bit swollen.    PT Comments  Patient demonstrates slow labored movement for sitting up at bedside requiring frequent rest breaks due to fatigue and right shoulder/neck pain with any pressure.  Patient able to stand and tolerated taking a few shuffling side steps at bedside before having to sit due to fatigue and poor standing balance.  Patient tolerated sitting up in chair after therapy with her son present.  Patient will benefit from continued skilled physical therapy in hospital and recommended venue below to increase strength, balance, endurance for safe ADLs and gait.       If plan is discharge home, recommend the following: A lot of help with walking and/or transfers;A lot of help with bathing/dressing/bathroom;Assistance with cooking/housework;Help with stairs or ramp for entrance   Can travel by private vehicle     No  Equipment Recommendations  None recommended by PT    Recommendations for Other Services       Precautions / Restrictions Precautions Precautions: Fall Restrictions Weight Bearing Restrictions: No     Mobility  Bed Mobility Overal bed mobility: Needs Assistance Bed Mobility: Supine to Sit     Supine to sit: Mod assist, Max assist     General bed mobility comments: increased time, labored movement with c/o right  shoulder pain, frequent rest breaks    Transfers Overall transfer level: Needs assistance Equipment used: Rolling walker (2 wheels) Transfers: Sit to/from Stand, Bed to chair/wheelchair/BSC Sit to Stand: Mod assist, Max assist   Step pivot transfers: Mod assist, Max assist       General transfer comment: unsteady labored movement    Ambulation/Gait Ambulation/Gait assistance: Mod assist, Max assist Gait Distance (Feet): 4 Feet Assistive device: Rolling walker (2 wheels) Gait Pattern/deviations: Decreased step length - right, Decreased step length - left, Decreased stride length, Trunk flexed, Shuffle Gait velocity: slow     General Gait Details: limited to a few slow labored unsteady side steps with flexed trunk and shuffling  of feet due to weakness   Stairs             Wheelchair Mobility     Tilt Bed    Modified Rankin (Stroke Patients Only)       Balance Overall balance assessment: Needs assistance Sitting-balance support: Feet supported, No upper extremity supported Sitting balance-Leahy Scale: Fair Sitting balance - Comments: fair/good seated at EOB   Standing balance support: Reliant on assistive device for balance, During functional activity, Bilateral upper extremity supported Standing balance-Leahy Scale: Poor Standing balance comment: using RW                            Cognition Arousal: Alert Behavior During Therapy: WFL for tasks assessed/performed, Anxious Overall  Cognitive Status: Within Functional Limits for tasks assessed                                          Exercises General Exercises - Lower Extremity Long Arc Quad: Seated, AROM, Strengthening, Both, 10 reps Hip Flexion/Marching: Seated, AROM, Strengthening, Both, 10 reps Toe Raises: Seated, AROM, Strengthening, Both, 10 reps Heel Raises: Seated, AROM, Strengthening, Both, 10 reps    General Comments        Pertinent Vitals/Pain Pain  Assessment Pain Assessment: Faces Faces Pain Scale: Hurts even more Pain Location: right shoulder, neck Pain Descriptors / Indicators: Sore, Grimacing, Guarding, Discomfort Pain Intervention(s): Limited activity within patient's tolerance, Monitored during session, Repositioned    Home Living                          Prior Function            PT Goals (current goals can now be found in the care plan section) Acute Rehab PT Goals Patient Stated Goal: return home PT Goal Formulation: With patient/family Time For Goal Achievement: 06/28/23 Potential to Achieve Goals: Good Progress towards PT goals: Progressing toward goals    Frequency    Min 3X/week      PT Plan      Co-evaluation              AM-PAC PT "6 Clicks" Mobility   Outcome Measure  Help needed turning from your back to your side while in a flat bed without using bedrails?: A Lot Help needed moving from lying on your back to sitting on the side of a flat bed without using bedrails?: A Lot Help needed moving to and from a bed to a chair (including a wheelchair)?: A Lot Help needed standing up from a chair using your arms (e.g., wheelchair or bedside chair)?: A Lot Help needed to walk in hospital room?: A Lot Help needed climbing 3-5 steps with a railing? : Total 6 Click Score: 11    End of Session Equipment Utilized During Treatment: Gait belt Activity Tolerance: Patient tolerated treatment well;Patient limited by fatigue;Patient limited by pain Patient left: in chair;with call bell/phone within reach;with family/visitor present Nurse Communication: Mobility status PT Visit Diagnosis: Unsteadiness on feet (R26.81);Other abnormalities of gait and mobility (R26.89);Muscle weakness (generalized) (M62.81) Pain - Right/Left: Right Pain - part of body: Shoulder     Time: 1610-9604 PT Time Calculation (min) (ACUTE ONLY): 30 min  Charges:    $Therapeutic Exercise: 8-22 mins $Therapeutic  Activity: 8-22 mins PT General Charges $$ ACUTE PT VISIT: 1 Visit                     12:29 PM, 06/17/23 Ocie Bob, MPT Physical Therapist with [facility] [phone] office [phone] mobile phone

## 2023-06-18 DIAGNOSIS — G9341 Metabolic encephalopathy: Secondary | ICD-10-CM | POA: Diagnosis not present

## 2023-06-18 LAB — CULTURE, BLOOD (ROUTINE X 2): Special Requests: ADEQUATE

## 2023-06-18 MED ORDER — SODIUM CHLORIDE 0.9% FLUSH
3.0000 mL | Freq: Two times a day (BID) | INTRAVENOUS | Status: DC
  Administered 2023-06-19 – 2023-06-20 (×3): 3 mL via INTRAVENOUS

## 2023-06-18 NOTE — Progress Notes (Addendum)
PROGRESS NOTE  [name], is a 89 y.o. female, DOB - 1934-09-07, ZOX:[id number]  Admit date - 06/14/2023   Admitting Physician Geraldine Tesar Mariea Clonts, MD  Outpatient Primary MD for the patient is [name], MD  LOS - 3  Chief Complaint  Patient presents with   Weakness     Brief Narrative:   89 y.o. female with Pmhx of HFpEF (LVEF 65-70% July 2024), NSTEMI 2018, CAD s/p stent placement, CVA (2023), CKD stage IIIb, PAfib, HLD, , HTN admitted on 06/15/2023 with acute metabolic encephalopathy and generalized weakness in the setting of E. coli UTI  -Patient is medically stable for transfer to SNF rehab - Awaiting insurance approval for transfer to SNF rehab   -Assessment and Plan: 1) acute metabolic encephalopathy--- secondary to E. coli UTI -Mentation is back to baseline according to patient's son and daughters-  - 2)ESBL  E. coli UTI---- -stopped IV Rocephin on 06/17/23 -Started IV meropenem on 06/17/23 --consider giving fosfomycin at discharge -Blood cultures with staph epi most likely contaminant   3)Generalized weakness and Deconditioning--patient with significant mobility related challenges---due to ESBL E. coli UTI infection -Patient was found to be weak prior to discharge evaluated by physical therapy patient and family declined SNF placement on 06/12/2023 prior to discharge from inpatient service [facility] -When patient arrived home on 06/12/2023 required wheelchair to get from the car into the house she could not ambulate -When she woke up on 06/14/2023 she had significant difficulties with mobility related ADLs- -Pt is unable to perform mobility related ADLs -PT eval appreciated recommends SNF rehab --Patient is medically stable for transfer to SNF rehab - Awaiting insurance approval for transfer to SNF rehab   4)CAD s/p DES x2 in 2011 NSTEMI s/p DES x1 in 2018 Mixed HLD Hx CVA 08/2022 Patient with extensive history of NSTEMI, stents, and stroke.  -Chest wall  tenderness on 06/18/2023 EKG nonacute, EKG when compared to prior is without new change Continued PTA aspirin, Crestor and metoprolol    5)CKD stage IIIb Secondary hyperparathyroidism -Creatinine trending up  baseline creatinine usually around 1.2-1.3   6)PAFib-continue Xarelto for stroke prophylaxis and metoprolol for rate control History of temporary pacemaker placement 2018, last interrogated August 2024. -   7)HTN -Continue metoprolol and hydralazine   8) chronic anemia--multifactorial in the setting of CKD and chronic Xarelto and aspirin use -Hgb is close to baseline -No bleeding concerns at this time -Monitor closely   9) insomnia and anorexia----family requesting to stop trial of Remeron  10)chronic HFpEF/chronic diastolic dysfunction CHF/chronic bilateral lower extremity edema -Torsemide on hold due to increased creatinine on 06/14/2023 Reports frequent UTIs so therefore would not be a great candidate for SGLT2  -Continue metoprolol -Restart torsemide when feasible -TED stockings advised   11)Social/Ethics--discussed with patient's son Marcial Pacas Margo Aye), [name] Dois Davenport and [name] Tobi Bastos  -Patient remains a full code  12)Class 1 Obesity- -Low calorie diet, portion control and increase physical activity discussed with patient -Body mass index is 33.2 kg/m.  Status is: Inpatient   Disposition: The patient is from: Home              Anticipated d/c is to: SNF              Anticipated d/c date is: 1 day              Patient currently is medically stable to d/c. Barriers: Awaiting insurance approval for transfer to SNF rehab  Code Status :  -  Code  Status: Full Code   Family Communication:    (patient is alert, awake and coherent)  -Discussed with son Marcial Pacas and daughters Tobi Bastos and Dois Davenport at bedside  DVT Prophylaxis  :   - SCDs   Place TED hose Start: 06/15/23 1237 SCDs Start: 06/15/23 1123 Place TED hose Start: 06/15/23 1123 Rivaroxaban (XARELTO)  tablet 15 mg   Lab Results  Component Value Date   PLT 299 06/17/2023   Inpatient Medications  Scheduled Meds:  aspirin EC  81 mg Oral Daily   hydrALAZINE  25 mg Oral BID   metoprolol succinate  12.5 mg Oral Daily   pantoprazole  40 mg Oral Daily   potassium chloride  20 mEq Oral Daily   Rivaroxaban  15 mg Oral Q supper   rosuvastatin  40 mg Oral Daily   sodium chloride flush  3 mL Intravenous Q12H   sodium chloride flush  3 mL Intravenous Q12H   torsemide  20 mg Oral Daily   Continuous Infusions:  meropenem (MERREM) IV 1 g (06/18/23 0939)   PRN Meds:.acetaminophen **OR** acetaminophen, bisacodyl, hydrALAZINE, Muscle Rub, ondansetron **OR** ondansetron (ZOFRAN) IV, polyethylene glycol, traZODone   Anti-infectives (From admission, onward)    Start     Dose/Rate Route Frequency Ordered Stop   06/17/23 1400  meropenem (MERREM) 1 g in sodium chloride 0.9 % 100 mL IVPB        1 g 200 mL/hr over 30 Minutes Intravenous Every 12 hours 06/17/23 1224     06/16/23 1000  cefTRIAXone (ROCEPHIN) 1 g in sodium chloride 0.9 % 100 mL IVPB  Status:  Discontinued        1 g 200 mL/hr over 30 Minutes Intravenous Every 24 hours 06/15/23 1121 06/17/23 1224   06/15/23 1130  cefTRIAXone (ROCEPHIN) 1 g in sodium chloride 0.9 % 100 mL IVPB  Status:  Discontinued        1 g 200 mL/hr over 30 Minutes Intravenous Every 24 hours 06/15/23 1120 06/15/23 1121   06/15/23 1030  cefTRIAXone (ROCEPHIN) 2 g in sodium chloride 0.9 % 100 mL IVPB        2 g 200 mL/hr over 30 Minutes Intravenous  Once 06/15/23 1025 06/15/23 1203      Subjective: [name] today has no fevers, no emesis,  No chest pain,   - -Son Marcial Pacas)  at bedside -Patient states he does not feel as good today as yesterday -Complaining of left/upper chest wall discomfort-- consistently reproducible upper/anterior left chest wall tenderness on palpation and with positional change  Objective: Vitals:   06/18/23 0430 06/18/23 0919  06/18/23 1004 06/18/23 1426  BP: 127/60 123/66 125/76 (!) 122/57  Pulse: 86 85 84 91  Resp:      Temp: 98.7 F (37.1 C) 98.6 F (37 C)    TempSrc: Oral Oral    SpO2:  99% 99% 95%  Weight:      Height:        Intake/Output Summary (Last 24 hours) at 06/18/2023 1829 Last data filed at 06/18/2023 1426 Gross per 24 hour  Intake --  Output 1475 ml  Net -1475 ml   Filed Weights   06/14/23 0432  Weight: 90.5 kg    Physical Exam Gen:- Awake Alert, in no acute distress HEENT:- [place].AT, No sclera icterus Neck-Supple Neck,No JVD,.  Lungs-  CTAB , fair symmetrical air movement, consistently reproducible upper/anterior left chest wall tenderness on palpation and with positional change CV- S1, S2 normal, regular  Abd-  +ve  B.Sounds, Abd Soft, No tenderness, no CVA area tenderness Extremity/Skin:- No  edema, pedal pulses present  Psych-affect is appropriate, oriented x3 Neuro-generalized weakness no new focal deficits, no tremors  Data Reviewed: I have personally reviewed following labs and imaging studies  CBC: Recent Labs  Lab 06/12/23 0431 06/14/23 0520 06/15/23 1209 06/17/23 0345  WBC 7.6 9.5 10.0 8.0  HGB 10.7* 10.8* 10.6* 10.2*  HCT 32.1* 34.2* 33.6* 33.0*  MCV 90.7 91.4 91.8 93.0  PLT 257 307 330 299   Basic Metabolic Panel: Recent Labs  Lab 06/12/23 0431 06/14/23 0520 06/15/23 1209 06/17/23 0345  NA 135 135 134* 134*  K 3.2* 3.8 3.9 3.7  CL 100 99 101 99  CO2 25 25 24 26   GLUCOSE 120* 117* 96 89  BUN 18 31* 27* 27*  CREATININE 1.38* 1.83* 1.51* 1.76*  CALCIUM 7.9* 8.3* 8.0* 8.0*  MG 2.0 1.9  --   --   PHOS  --   --  3.4  --    GFR: Estimated Creatinine Clearance: 25.5 mL/min (A) (by C-G formula based on SCr of 1.76 mg/dL (H)). Liver Function Tests: Recent Labs  Lab 06/14/23 0520 06/15/23 1209  AST 27  --   ALT 12  --   ALKPHOS 64  --   BILITOT 0.5  --   PROT 6.0*  --   ALBUMIN 2.3* 2.0*   Recent Results (from the past 240 hour(s))  SARS  Coronavirus 2 by RT PCR (hospital order, performed in [facility] hospital lab) *cepheid single result test* Anterior Nasal Swab     Status: None   Collection Time: 06/14/23  6:10 AM   Specimen: Anterior Nasal Swab  Result Value Ref Range Status   SARS Coronavirus 2 by RT PCR NEGATIVE NEGATIVE Final    Comment: (NOTE) SARS-CoV-2 target nucleic acids are NOT DETECTED.  The SARS-CoV-2 RNA is generally detectable in upper and lower respiratory specimens during the acute phase of infection. The lowest concentration of SARS-CoV-2 viral copies this assay can detect is 250 copies / mL. A negative result does not preclude SARS-CoV-2 infection and should not be used as the sole basis for treatment or other patient management decisions.  A negative result may occur with improper specimen collection / handling, submission of specimen other than nasopharyngeal swab, presence of viral mutation(s) within the areas targeted by this assay, and inadequate number of viral copies (<250 copies / mL). A negative result must be combined with clinical observations, patient history, and epidemiological information.  Fact Sheet for Patients:   RoadLapTop.co.za  Fact Sheet for Healthcare Providers: http://kim-miller.com/  This test is not yet approved or  cleared by the [place] FDA and has been authorized for detection and/or diagnosis of SARS-CoV-2 by FDA under an Emergency Use Authorization (EUA).  This EUA will remain in effect (meaning this test can be used) for the duration of the COVID-19 declaration under Section 564(b)(1) of the Act, 21 U.S.C. section 360bbb-3(b)(1), unless the authorization is terminated or revoked sooner.  Performed at [facility], [address], [place], [place] [address]   Urine Culture     Status: Abnormal   Collection Time: 06/15/23  9:34 AM   Specimen: Urine, Clean Catch  Result Value Ref Range Status   Specimen  Description   Final    URINE, CLEAN CATCH Performed at [facility], [address] [place], [place] [address]    Special Requests   Final    NONE Performed at [facility], [address], [place], [place] [address]    Culture (A)  Final    >=100,000 COLONIES/mL ESCHERICHIA COLI Confirmed Extended Spectrum Beta-Lactamase Producer (ESBL).  In bloodstream infections from ESBL organisms, carbapenems are preferred over piperacillin/tazobactam. They are shown to have a lower risk of mortality.    Report Status 06/17/2023 FINAL  Final   Organism ID, Bacteria ESCHERICHIA COLI (A)  Final      Susceptibility   Escherichia coli - MIC*    AMPICILLIN >=32 RESISTANT Resistant     CEFAZOLIN >=64 RESISTANT Resistant     CEFEPIME 16 RESISTANT Resistant     CEFTRIAXONE >=64 RESISTANT Resistant     CIPROFLOXACIN >=4 RESISTANT Resistant     GENTAMICIN <=1 SENSITIVE Sensitive     IMIPENEM <=0.25 SENSITIVE Sensitive     NITROFURANTOIN <=16 SENSITIVE Sensitive     TRIMETH/SULFA <=20 SENSITIVE Sensitive     AMPICILLIN/SULBACTAM 16 INTERMEDIATE Intermediate     PIP/TAZO 8 SENSITIVE Sensitive ug/mL    * >=100,000 COLONIES/mL ESCHERICHIA COLI  Culture, blood (Routine X 2) w Reflex to ID Panel     Status: None (Preliminary result)   Collection Time: 06/15/23 12:09 PM   Specimen: Right Antecubital; Blood  Result Value Ref Range Status   Specimen Description   Final    RIGHT ANTECUBITAL BOTTLES DRAWN AEROBIC AND ANAEROBIC   Special Requests Blood Culture adequate volume  Final   Culture   Final    NO GROWTH 3 DAYS Performed at [facility], [address] [place], [place] [address]    Report Status PENDING  Incomplete  Culture, blood (Routine X 2) w Reflex to ID Panel     Status: Abnormal   Collection Time: 06/15/23 12:09 PM   Specimen: Left Antecubital; Blood  Result Value Ref Range Status   Specimen Description   Final    LEFT ANTECUBITAL BOTTLES DRAWN AEROBIC AND ANAEROBIC Performed at [facility], [address] [place], [place] [address]    Special Requests   Final    Blood Culture adequate volume Performed at [facility], [address] [place], [place] [address]    Culture  Setup Time   Final    GRAM POSITIVE COCCI ANAEROBIC BOTTLE ONLY Gram Stain Report Called to,Read Back By and Verified With: D TUCHMAN AT 1238 ON [id number] BY S DALTON CRITICAL RESULT CALLED TO, READ BACK BY AND VERIFIED WITH: S RICHARDSON RN 06/17/23 @ 0106 BY AB    Culture (A)  Final    STAPHYLOCOCCUS EPIDERMIDIS THE SIGNIFICANCE OF ISOLATING THIS ORGANISM FROM A SINGLE SET OF BLOOD CULTURES WHEN MULTIPLE SETS ARE DRAWN IS UNCERTAIN. PLEASE NOTIFY THE MICROBIOLOGY DEPARTMENT WITHIN ONE WEEK IF SPECIATION AND SENSITIVITIES ARE REQUIRED. Performed at [facility], [address] [place], [place] [address]    Report Status 06/18/2023 FINAL  Final  Blood Culture ID Panel (Reflexed)     Status: Abnormal   Collection Time: 06/15/23 12:09 PM  Result Value Ref Range Status   Enterococcus faecalis NOT DETECTED NOT DETECTED Final   Enterococcus Faecium NOT DETECTED NOT DETECTED Final   Listeria monocytogenes NOT DETECTED NOT DETECTED Final   Staphylococcus species DETECTED (A) NOT DETECTED Final    Comment: CRITICAL RESULT CALLED TO, READ BACK BY AND VERIFIED WITH: S RICHARDSON RN 06/17/23 @ 0106 BY AB    Staphylococcus aureus (BCID) NOT DETECTED NOT DETECTED Final   Staphylococcus epidermidis DETECTED (A) NOT DETECTED Final    Comment: Methicillin (oxacillin) resistant coagulase negative  staphylococcus. Possible blood culture contaminant (unless isolated from more than one blood culture draw or clinical case suggests pathogenicity). No antibiotic treatment is indicated for blood  culture contaminants. CRITICAL RESULT CALLED TO, READ BACK BY AND VERIFIED WITH: S RICHARDSON RN 06/17/23 @ 0106 BY AB    Staphylococcus lugdunensis NOT DETECTED NOT DETECTED Final   Streptococcus species NOT DETECTED NOT  DETECTED Final   Streptococcus agalactiae NOT DETECTED NOT DETECTED Final   Streptococcus pneumoniae NOT DETECTED NOT DETECTED Final   Streptococcus pyogenes NOT DETECTED NOT DETECTED Final   A.calcoaceticus-baumannii NOT DETECTED NOT DETECTED Final   Bacteroides fragilis NOT DETECTED NOT DETECTED Final   Enterobacterales NOT DETECTED NOT DETECTED Final   Enterobacter cloacae complex NOT DETECTED NOT DETECTED Final   Escherichia coli NOT DETECTED NOT DETECTED Final   Klebsiella aerogenes NOT DETECTED NOT DETECTED Final   Klebsiella oxytoca NOT DETECTED NOT DETECTED Final   Klebsiella pneumoniae NOT DETECTED NOT DETECTED Final   Proteus species NOT DETECTED NOT DETECTED Final   Salmonella species NOT DETECTED NOT DETECTED Final   Serratia marcescens NOT DETECTED NOT DETECTED Final   Haemophilus influenzae NOT DETECTED NOT DETECTED Final   Neisseria meningitidis NOT DETECTED NOT DETECTED Final   Pseudomonas aeruginosa NOT DETECTED NOT DETECTED Final   Stenotrophomonas maltophilia NOT DETECTED NOT DETECTED Final   Candida albicans NOT DETECTED NOT DETECTED Final   Candida auris NOT DETECTED NOT DETECTED Final   Candida glabrata NOT DETECTED NOT DETECTED Final   Candida krusei NOT DETECTED NOT DETECTED Final   Candida parapsilosis NOT DETECTED NOT DETECTED Final   Candida tropicalis NOT DETECTED NOT DETECTED Final   Cryptococcus neoformans/gattii NOT DETECTED NOT DETECTED Final   Methicillin resistance mecA/C DETECTED (A) NOT DETECTED Final    Comment: CRITICAL RESULT CALLED TO, READ BACK BY AND VERIFIED WITH: S RICHARDSON RN 06/17/23 @ 0106 BY AB Performed at [facility], [address], [place], [place] [address]     Radiology Studies: No results found.  Scheduled Meds:  aspirin EC  81 mg Oral Daily   hydrALAZINE  25 mg Oral BID   metoprolol succinate  12.5 mg Oral Daily   pantoprazole  40 mg Oral Daily   potassium chloride  20 mEq Oral Daily   Rivaroxaban  15 mg Oral Q  supper   rosuvastatin  40 mg Oral Daily   sodium chloride flush  3 mL Intravenous Q12H   sodium chloride flush  3 mL Intravenous Q12H   torsemide  20 mg Oral Daily   Continuous Infusions:  meropenem (MERREM) IV 1 g (06/18/23 0939)    LOS: 3 days   Shon Hale M.D on 06/18/2023 at 6:29 PM  Go to [email or web address] - for contact info  [facility] - Office  [phone]  If 7PM-7AM, please contact night-coverage [email or web address] 06/18/2023, 6:29 PM

## 2023-06-18 NOTE — Plan of Care (Signed)
Pt progressing. Slept through night without incident during nursing shift

## 2023-06-18 NOTE — Progress Notes (Signed)
Patient c/o chest pain,and left arm pain unable to rate pain at this time,patient stated" It just hurts". Dr Marisa Severin notified,nurse at bedside. Vital signs B/P 125/76,hr 79, temp 98.2,room air saturation 98 percent. Plan of care on going.

## 2023-06-18 NOTE — Plan of Care (Signed)
  Problem: Nutrition: Goal: Adequate nutrition will be maintained Outcome: Progressing   

## 2023-06-18 NOTE — Progress Notes (Addendum)
Patient place on Contact Isolation per MD's order,and Infection control policy. Patient and family educated on Contact Isolation,verbalized understanding, educational care notes given. Plan of care on going.

## 2023-06-18 NOTE — TOC Progression Note (Signed)
Transition of Care [facility]) - Progression Note    Patient Details  Name: [name] MRN: [id number] Date of Birth: [birth year]  Transition of Care [facility]) CM/SW Contact  [place], [place] Phone Number: 06/18/2023, 11:29 AM  Clinical Narrative:    CSW spoke with admissions rep with [facility] who states that they are not able to start insurance auth due to showing not in network with pts insurance. CSW updated pts daughter of this. At this time pts daughter states she will need to follow up with pt and her siblings to see which facility they would like to accept. TOC to follow.     Barriers to Discharge: Insurance Authorization  Expected Discharge Plan and Services                                               Social Determinants of Health (SDOH) Interventions SDOH Screenings   Food Insecurity: No Food Insecurity (06/10/2023)  Housing: Low Risk  (06/10/2023)  Transportation Needs: No Transportation Needs (06/10/2023)  Utilities: Not At Risk (06/10/2023)  Depression (PHQ2-9): Low Risk  (05/03/2023)  Financial Resource Strain: Low Risk  (03/07/2023)  Physical Activity: Unknown (01/27/2023)  Social Connections: Unknown (01/27/2023)  Stress: No Stress Concern Present (01/27/2023)  Tobacco Use: Low Risk  (06/14/2023)    Readmission Risk Interventions    06/12/2023   10:22 AM 03/25/2023    3:24 PM 08/21/2022   12:03 PM  Readmission Risk Prevention Plan  Post Dischage Appt  Complete   Medication Screening  Complete   Transportation Screening Complete Complete Complete  PCP or Specialist Appt within 5-7 Days Complete    Home Care Screening Complete  Complete  Medication Review (RN CM) Referral to Pharmacy  Complete

## 2023-06-19 ENCOUNTER — Other Ambulatory Visit: Payer: Self-pay | Admitting: Cardiology

## 2023-06-19 DIAGNOSIS — G9341 Metabolic encephalopathy: Secondary | ICD-10-CM | POA: Diagnosis not present

## 2023-06-19 DIAGNOSIS — N390 Urinary tract infection, site not specified: Principal | ICD-10-CM

## 2023-06-19 DIAGNOSIS — I480 Paroxysmal atrial fibrillation: Secondary | ICD-10-CM | POA: Diagnosis not present

## 2023-06-19 DIAGNOSIS — G4733 Obstructive sleep apnea (adult) (pediatric): Secondary | ICD-10-CM

## 2023-06-19 DIAGNOSIS — R531 Weakness: Secondary | ICD-10-CM

## 2023-06-19 DIAGNOSIS — N1832 Chronic kidney disease, stage 3b: Secondary | ICD-10-CM | POA: Diagnosis not present

## 2023-06-19 DIAGNOSIS — I10 Essential (primary) hypertension: Secondary | ICD-10-CM

## 2023-06-19 DIAGNOSIS — N179 Acute kidney failure, unspecified: Secondary | ICD-10-CM

## 2023-06-19 LAB — CBC
HCT: 33.5 % — ABNORMAL LOW (ref 36.0–46.0)
Hemoglobin: 10.7 g/dL — ABNORMAL LOW (ref 12.0–15.0)
MCH: 29.1 pg (ref 26.0–34.0)
MCHC: 31.9 g/dL (ref 30.0–36.0)
MCV: 91 fL (ref 80.0–100.0)
Platelets: 320 10*3/uL (ref 150–400)
RBC: 3.68 MIL/uL — ABNORMAL LOW (ref 3.87–5.11)
RDW: 16 % — ABNORMAL HIGH (ref 11.5–15.5)
WBC: 10.1 10*3/uL (ref 4.0–10.5)
nRBC: 0 % (ref 0.0–0.2)

## 2023-06-19 LAB — BASIC METABOLIC PANEL
Anion gap: 11 (ref 5–15)
BUN: 26 mg/dL — ABNORMAL HIGH (ref 8–23)
CO2: 26 mmol/L (ref 22–32)
Calcium: 8.2 mg/dL — ABNORMAL LOW (ref 8.9–10.3)
Chloride: 97 mmol/L — ABNORMAL LOW (ref 98–111)
Creatinine, Ser: 2.03 mg/dL — ABNORMAL HIGH (ref 0.44–1.00)
GFR, Estimated: 23 mL/min — ABNORMAL LOW (ref >60)
Glucose, Bld: 91 mg/dL (ref 70–99)
Potassium: 3.6 mmol/L (ref 3.5–5.1)
Sodium: 134 mmol/L — ABNORMAL LOW (ref 135–145)

## 2023-06-19 NOTE — Progress Notes (Signed)
Physical Therapy Treatment Patient Details Name: [name] MRN: [id number] DOB: [birth year] Today's Date: 06/19/2023   History of Present Illness History of Present Illness              [name] is a 89 y.o. year-old female with a history of CAD, CKD, CHF presenting to the ED with chief complaint of weakness.     Recent admission to the hospital was lower extremity edema, required diuresis.  Was doing better for a day or 2 but is now worse again.  Generalized weakness, not really able to get out of bed on her own.  Endorsing occasional shortness of breath, the legs are better than they were during the hospitalization but are still a bit swollen.    PT Comments  Patient demonstrates slow labored movement for sitting up at bedside with limited use RUE due shoulder pain.  Patient tolerated 2 trials of taking side steps at bedside with mostly shuffling of feet due weakness and unable to attempt taking steps away from bedside due to fall risk.  Patient demonstrates fair return for completing BLE ROM/strengthening exercises requiring frequent verbal cueing and active assistance.  Patient tolerated sitting up in chair after therapy with family members in room.  Patient will benefit from continued skilled physical therapy in hospital and recommended venue below to increase strength, balance, endurance for safe ADLs and gait.    ]    If plan is discharge home, recommend the following: A lot of help with walking and/or transfers;A lot of help with bathing/dressing/bathroom;Assistance with cooking/housework;Help with stairs or ramp for entrance   Can travel by private vehicle     No  Equipment Recommendations  None recommended by PT    Recommendations for Other Services       Precautions / Restrictions Precautions Precautions: Fall Restrictions Weight Bearing Restrictions: No     Mobility  Bed Mobility Overal bed mobility: Needs Assistance Bed Mobility: Supine to Sit     Supine to  sit: Mod assist, Max assist     General bed mobility comments: increased time, labored movement    Transfers Overall transfer level: Needs assistance Equipment used: Rolling walker (2 wheels) Transfers: Sit to/from Stand, Bed to chair/wheelchair/BSC Sit to Stand: Mod assist   Step pivot transfers: Mod assist       General transfer comment: increased time, labored movement    Ambulation/Gait Ambulation/Gait assistance: Mod assist, Max assist Gait Distance (Feet): 6 Feet Assistive device: Rolling walker (2 wheels) Gait Pattern/deviations: Decreased step length - right, Decreased step length - left, Decreased stride length, Trunk flexed, Shuffle Gait velocity: slow     General Gait Details: able to complete a few slow labored side steps requiring occasional tactile assistance to move right foot, has to shuffle most of time and limited mostly due to fatigue and BLE weakness   Stairs             Wheelchair Mobility     Tilt Bed    Modified Rankin (Stroke Patients Only)       Balance Overall balance assessment: Needs assistance Sitting-balance support: Feet supported, No upper extremity supported Sitting balance-Leahy Scale: Fair Sitting balance - Comments: fair/good seated at EOB   Standing balance support: Reliant on assistive device for balance, During functional activity, Bilateral upper extremity supported Standing balance-Leahy Scale: Poor Standing balance comment: using RW  Cognition Arousal: Alert Behavior During Therapy: WFL for tasks assessed/performed Overall Cognitive Status: Within Functional Limits for tasks assessed                                          Exercises General Exercises - Lower Extremity Long Arc Quad: Seated, AROM, Strengthening, Both, 10 reps, AAROM Hip Flexion/Marching: Seated, AROM, Strengthening, Both, 10 reps, AAROM Toe Raises: Seated, AROM, Strengthening, Both, 10  reps Heel Raises: Seated, AROM, Strengthening, Both, 10 reps    General Comments        Pertinent Vitals/Pain Pain Assessment Pain Assessment: Faces Faces Pain Scale: Hurts little more Pain Location: right shoulder, neck Pain Descriptors / Indicators: Sore, Grimacing, Guarding, Discomfort Pain Intervention(s): Limited activity within patient's tolerance, Monitored during session, Repositioned    Home Living                          Prior Function            PT Goals (current goals can now be found in the care plan section) Acute Rehab PT Goals Patient Stated Goal: return home PT Goal Formulation: With patient/family Time For Goal Achievement: 06/28/23 Potential to Achieve Goals: Good Progress towards PT goals: Progressing toward goals    Frequency    Min 3X/week      PT Plan      Co-evaluation              AM-PAC PT "6 Clicks" Mobility   Outcome Measure  Help needed turning from your back to your side while in a flat bed without using bedrails?: A Lot Help needed moving from lying on your back to sitting on the side of a flat bed without using bedrails?: A Lot Help needed moving to and from a bed to a chair (including a wheelchair)?: A Lot Help needed standing up from a chair using your arms (e.g., wheelchair or bedside chair)?: A Lot Help needed to walk in hospital room?: A Lot Help needed climbing 3-5 steps with a railing? : Total 6 Click Score: 11    End of Session   Activity Tolerance: Patient tolerated treatment well;Patient limited by fatigue Patient left: in chair;with call bell/phone within reach;with family/visitor present Nurse Communication: Mobility status PT Visit Diagnosis: Unsteadiness on feet (R26.81);Other abnormalities of gait and mobility (R26.89);Muscle weakness (generalized) (M62.81) Pain - Right/Left: Right Pain - part of body: Shoulder     Time: 8413-2440 PT Time Calculation (min) (ACUTE ONLY): 33 min  Charges:     $Therapeutic Exercise: 8-22 mins $Therapeutic Activity: 8-22 mins PT General Charges $$ ACUTE PT VISIT: 1 Visit                     1:54 PM, 06/19/23 Ocie Bob, MPT Physical Therapist with [facility] 336 [phone] office [phone] mobile phone

## 2023-06-19 NOTE — TOC Progression Note (Addendum)
Transition of Care [facility]) - Progression Note    Patient Details  Name: [name] MRN: [id number] Date of Birth: 11/04/1933  [facility] CM/SW Contact  Villa Herb, [place] Phone Number: 06/19/2023, 11:20 AM  Clinical Narrative:    CSW spoke with pts daughter who states they will accept bed at [facility]. CSW updated Jill Side with admissions of bed choice. Insurance [employer] Harvey is still pending at this time. Avoidable day entered.   Addendum 4pm: CSW updated that pts insurance [employer] has been approved at this time. CSW updated Jill Side with [facility] rehab who states they can accept pt tomorrow. TOC to follow.     Barriers to Discharge: Insurance Authorization  Expected Discharge Plan and Services                                               Social Determinants of Health (SDOH) Interventions SDOH Screenings   Food Insecurity: No Food Insecurity (06/10/2023)  Housing: Low Risk  (06/10/2023)  Transportation Needs: No Transportation Needs (06/10/2023)  Utilities: Not At Risk (06/10/2023)  Depression (PHQ2-9): Low Risk  (05/03/2023)  Financial Resource Strain: Low Risk  (03/07/2023)  Physical Activity: Unknown (01/27/2023)  Social Connections: Unknown (01/27/2023)  Stress: No Stress Concern Present (01/27/2023)  Tobacco Use: Low Risk  (06/14/2023)    Readmission Risk Interventions    06/12/2023   10:22 AM 03/25/2023    3:24 PM 08/21/2022   12:03 PM  Readmission Risk Prevention Plan  Post Dischage Appt  Complete   Medication Screening  Complete   Transportation Screening Complete Complete Complete  PCP or Specialist Appt within 5-7 Days Complete    Home Care Screening Complete  Complete  Medication Review (RN CM) Referral to Pharmacy  Complete

## 2023-06-19 NOTE — Progress Notes (Signed)
   06/19/23 2200  Vitals  Temp 98.7 F (37.1 C)  Temp Source Oral  MEWS COLOR  MEWS Score Color Green  MEWS Score  MEWS Temp 0  MEWS Systolic 0  MEWS Pulse 0  MEWS RR 0  MEWS LOC 0  MEWS Score 0  Provider Notification  Provider Name/Title Zierle-Ghosh  Date Provider Notified 06/19/23  Time Provider Notified 2317  Method of Notification Page  Notification Reason Other (Comment) (yellow muse follow up oral temp improved)

## 2023-06-19 NOTE — Plan of Care (Signed)
  Problem: Self-Care: Goal: Ability to participate in self-care as condition permits will improve Outcome: Progressing   Problem: Nutrition: Goal: Risk of aspiration will decrease Outcome: Progressing   

## 2023-06-19 NOTE — Progress Notes (Signed)
PROGRESS NOTE  [name], is a 89 y.o. female, DOB - 03-Mar-1934, WNU:[id number]  Admit date - 06/14/2023   Admitting Physician Courage Mariea Clonts, MD  Outpatient Primary MD for the patient is [name], MD  LOS - 4  Chief Complaint  Patient presents with   Weakness     Brief Narrative:   89 y.o. female with Pmhx of HFpEF (LVEF 65-70% July 2024), NSTEMI 2018, CAD s/p stent placement, CVA (2023), CKD stage IIIb, PAfib, HLD, , HTN admitted on 06/15/2023 with acute metabolic encephalopathy and generalized weakness in the setting of E. coli UTI  -Patient is medically stable for transfer to SNF rehab - Awaiting insurance approval for transfer to SNF rehab; patient is hemodynamically stable and medically ready for discharge.   -Assessment and Plan: 1) acute metabolic encephalopathy--- secondary to E. coli UTI -Mentation is back to baseline according to patient's son and daughters-  - 2)ESBL  E. coli UTI---- -stopped IV Rocephin on 06/17/23 -Started IV meropenem on 06/17/23 --consider giving fosfomycin at discharge -Blood cultures with staph epi most likely contaminant   3)Generalized weakness and Deconditioning--patient with significant mobility related challenges---due to ESBL E. coli UTI infection -Patient was found to be weak prior to discharge evaluated by physical therapy patient and family declined SNF placement on 06/12/2023 prior to discharge from inpatient service [facility] -When patient arrived home on 06/12/2023 required wheelchair to get from the car into the house she could not ambulate -When she woke up on 06/14/2023 she had significant difficulties with mobility related ADLs- -Pt is unable to perform mobility related ADLs -PT eval appreciated recommends SNF rehab --Patient is medically stable for transfer to SNF rehab - Awaiting insurance approval for transfer to SNF rehab; patient is hemodynamically stable and medically ready.   4)CAD s/p DES x2 in 2011 NSTEMI s/p  DES x1 in 2018 Mixed HLD Hx CVA 08/2022 Patient with extensive history of NSTEMI, stents, and stroke.  -Chest wall tenderness on 06/18/2023 EKG nonacute, EKG when compared to prior is without new change Continued PTA aspirin, Crestor and metoprolol    5)CKD stage IIIb Secondary hyperparathyroidism -Creatinine trending up  baseline creatinine usually around 1.2-1.3   6)PAFib- -continue Xarelto for stroke prophylaxis and metoprolol for rate control History of temporary pacemaker placement 2018, last interrogated August 2024. -   7)HTN -Continue metoprolol and hydralazine   8) chronic anemia--multifactorial in the setting of CKD and chronic Xarelto and aspirin use -Hgb is close to baseline -No overt bleeding appreciated. -Monitor closely   9) insomnia and anorexia- -Remeron has been discontinue following family request and adverse effects of significant somnolence.  10)chronic HFpEF/chronic diastolic dysfunction CHF/chronic bilateral lower extremity edema -Torsemide on hold due to increased creatinine on 06/14/2023 Reports frequent UTIs so therefore would not be a great candidate for SGLT2  -Continue metoprolol and resume the use of torsemide. -TED stockings advised/recommended.   11)Social/Ethics--discussed with patient's son Marcial Pacas Margo Aye), [name] Dois Davenport and [name] Tobi Bastos  -Patient remains a full code  12)Class 1 Obesity- -Low calorie diet, portion control and increase physical activity discussed with patient -Body mass index is 33.2 kg/m.  Status is: Inpatient   Disposition: The patient is from: Home              Anticipated d/c is to: SNF              Anticipated d/c date is: 1 day  Patient currently is medically stable to d/c. Barriers: Awaiting insurance approval for transfer to SNF rehab  Code Status :  -  Code Status: Full Code   Family Communication:    (patient is alert, awake and coherent)  -Discussed with son Marcial Pacas and  daughters Tobi Bastos and Dois Davenport at bedside  DVT Prophylaxis  :   - SCDs   Place TED hose Start: 06/15/23 1237 SCDs Start: 06/15/23 1123 Place TED hose Start: 06/15/23 1123 Rivaroxaban (XARELTO) tablet 15 mg   Lab Results  Component Value Date   PLT 320 06/19/2023   Inpatient Medications  Scheduled Meds:  aspirin EC  81 mg Oral Daily   hydrALAZINE  25 mg Oral BID   metoprolol succinate  12.5 mg Oral Daily   pantoprazole  40 mg Oral Daily   potassium chloride  20 mEq Oral Daily   Rivaroxaban  15 mg Oral Q supper   rosuvastatin  40 mg Oral Daily   sodium chloride flush  3 mL Intravenous Q12H   sodium chloride flush  3 mL Intravenous Q12H   torsemide  20 mg Oral Daily   Continuous Infusions:  meropenem (MERREM) IV 1 g (06/19/23 1044)   PRN Meds:.acetaminophen **OR** acetaminophen, bisacodyl, hydrALAZINE, Muscle Rub, ondansetron **OR** ondansetron (ZOFRAN) IV, polyethylene glycol, traZODone   Anti-infectives (From admission, onward)    Start     Dose/Rate Route Frequency Ordered Stop   06/17/23 1400  meropenem (MERREM) 1 g in sodium chloride 0.9 % 100 mL IVPB        1 g 200 mL/hr over 30 Minutes Intravenous Every 12 hours 06/17/23 1224     06/16/23 1000  cefTRIAXone (ROCEPHIN) 1 g in sodium chloride 0.9 % 100 mL IVPB  Status:  Discontinued        1 g 200 mL/hr over 30 Minutes Intravenous Every 24 hours 06/15/23 1121 06/17/23 1224   06/15/23 1130  cefTRIAXone (ROCEPHIN) 1 g in sodium chloride 0.9 % 100 mL IVPB  Status:  Discontinued        1 g 200 mL/hr over 30 Minutes Intravenous Every 24 hours 06/15/23 1120 06/15/23 1121   06/15/23 1030  cefTRIAXone (ROCEPHIN) 2 g in sodium chloride 0.9 % 100 mL IVPB        2 g 200 mL/hr over 30 Minutes Intravenous  Once 06/15/23 1025 06/15/23 1203      Subjective: Gertrue Holte no chest pain, no nausea, no vomiting, no fever.  Reports feeling weak and deconditioned.  Patient expressed bilateral knee pains and is asking for the use of  Biofreeze.  Afebrile.  Objective: Vitals:   06/18/23 1426 06/18/23 2045 06/19/23 0505 06/19/23 1404  BP: (!) 122/57 125/66 (!) 155/60 117/76  Pulse: 91 92 83 94  Resp:      Temp:  (!) 100.9 F (38.3 C) 97.8 F (36.6 C) 99 F (37.2 C)  TempSrc:  Oral Oral Oral  SpO2: 95% 95% 98% 98%  Weight:      Height:        Intake/Output Summary (Last 24 hours) at 06/19/2023 1626 Last data filed at 06/19/2023 1100 Gross per 24 hour  Intake 540 ml  Output 1550 ml  Net -1010 ml   Filed Weights   06/14/23 0432  Weight: 90.5 kg    Physical Exam General exam: Alert, awake, oriented x 3; no chest pain, no nausea, no vomiting.  Feeling better but expressing weakness and deconditioning. Respiratory system: Clear to auscultation. Respiratory effort normal.  No using  accessory muscles.  Good saturation on room air. Cardiovascular system: Rate controlled, no rubs, no gallops, no JVD. Gastrointestinal system: Abdomen is obese, nondistended, soft and nontender. No organomegaly or masses felt. Normal bowel sounds heard. Central nervous system: Alert and oriented. No focal neurological deficits. Extremities: No cyanosis or clubbing; patient reports pain on her knees bilaterally. Skin: No petechiae. Psychiatry:  Mood & affect appropriate.   Data Reviewed: I have personally reviewed following labs and imaging studies  CBC: Recent Labs  Lab 06/14/23 0520 06/15/23 1209 06/17/23 0345 06/19/23 0419  WBC 9.5 10.0 8.0 10.1  HGB 10.8* 10.6* 10.2* 10.7*  HCT 34.2* 33.6* 33.0* 33.5*  MCV 91.4 91.8 93.0 91.0  PLT 307 330 299 320   Basic Metabolic Panel: Recent Labs  Lab 06/14/23 0520 06/15/23 1209 06/17/23 0345 06/19/23 0419  NA 135 134* 134* 134*  K 3.8 3.9 3.7 3.6  CL 99 101 99 97*  CO2 25 24 26 26   GLUCOSE 117* 96 89 91  BUN 31* 27* 27* 26*  CREATININE 1.83* 1.51* 1.76* 2.03*  CALCIUM 8.3* 8.0* 8.0* 8.2*  MG 1.9  --   --   --   PHOS  --  3.4  --   --    GFR: Estimated Creatinine  Clearance: 21.7 mL/min (A) (by C-G formula based on SCr of 2.03 mg/dL (H)). Liver Function Tests: Recent Labs  Lab 06/14/23 0520 06/15/23 1209  AST 27  --   ALT 12  --   ALKPHOS 64  --   BILITOT 0.5  --   PROT 6.0*  --   ALBUMIN 2.3* 2.0*   Recent Results (from the past 240 hour(s))  SARS Coronavirus 2 by RT PCR (hospital order, performed in [facility] hospital lab) *cepheid single result test* Anterior Nasal Swab     Status: None   Collection Time: 06/14/23  6:10 AM   Specimen: Anterior Nasal Swab  Result Value Ref Range Status   SARS Coronavirus 2 by RT PCR NEGATIVE NEGATIVE Final    Comment: (NOTE) SARS-CoV-2 target nucleic acids are NOT DETECTED.  The SARS-CoV-2 RNA is generally detectable in upper and lower respiratory specimens during the acute phase of infection. The lowest concentration of SARS-CoV-2 viral copies this assay can detect is 250 copies / mL. A negative result does not preclude SARS-CoV-2 infection and should not be used as the sole basis for treatment or other patient management decisions.  A negative result may occur with improper specimen collection / handling, submission of specimen other than nasopharyngeal swab, presence of viral mutation(s) within the areas targeted by this assay, and inadequate number of viral copies (<250 copies / mL). A negative result must be combined with clinical observations, patient history, and epidemiological information.  Fact Sheet for Patients:   RoadLapTop.co.za  Fact Sheet for Healthcare Providers: http://kim-miller.com/  This test is not yet approved or  cleared by the [place] FDA and has been authorized for detection and/or diagnosis of SARS-CoV-2 by FDA under an Emergency Use Authorization (EUA).  This EUA will remain in effect (meaning this test can be used) for the duration of the COVID-19 declaration under Section 564(b)(1) of the Act, 21 U.S.C. section  360bbb-3(b)(1), unless the authorization is terminated or revoked sooner.  Performed at [facility], [address], [place], [place] [address]   Urine Culture     Status: Abnormal   Collection Time: 06/15/23  9:34 AM   Specimen: Urine, Clean Catch  Result Value Ref Range Status  Specimen Description   Final    URINE, CLEAN CATCH Performed at [facility], [address], [place], [place] [address]    Special Requests   Final    NONE Performed at [facility], [address] [place], [place] [address]    Culture (A)  Final    >=100,000 COLONIES/mL ESCHERICHIA COLI Confirmed Extended Spectrum Beta-Lactamase Producer (ESBL).  In bloodstream infections from ESBL organisms, carbapenems are preferred over piperacillin/tazobactam. They are shown to have a lower risk of mortality.    Report Status 06/17/2023 FINAL  Final   Organism ID, Bacteria ESCHERICHIA COLI (A)  Final      Susceptibility   Escherichia coli - MIC*    AMPICILLIN >=32 RESISTANT Resistant     CEFAZOLIN >=64 RESISTANT Resistant     CEFEPIME 16 RESISTANT Resistant     CEFTRIAXONE >=64 RESISTANT Resistant     CIPROFLOXACIN >=4 RESISTANT Resistant     GENTAMICIN <=1 SENSITIVE Sensitive     IMIPENEM <=0.25 SENSITIVE Sensitive     NITROFURANTOIN <=16 SENSITIVE Sensitive     TRIMETH/SULFA <=20 SENSITIVE Sensitive     AMPICILLIN/SULBACTAM 16 INTERMEDIATE Intermediate     PIP/TAZO 8 SENSITIVE Sensitive ug/mL    * >=100,000 COLONIES/mL ESCHERICHIA COLI  Culture, blood (Routine X 2) w Reflex to ID Panel     Status: None (Preliminary result)   Collection Time: 06/15/23 12:09 PM   Specimen: Right Antecubital; Blood  Result Value Ref Range Status   Specimen Description   Final    RIGHT ANTECUBITAL BOTTLES DRAWN AEROBIC AND ANAEROBIC   Special Requests Blood Culture adequate volume  Final   Culture   Final    NO GROWTH 4 DAYS Performed at [facility], [address], [place], [place] [address]    Report Status PENDING   Incomplete  Culture, blood (Routine X 2) w Reflex to ID Panel     Status: Abnormal   Collection Time: 06/15/23 12:09 PM   Specimen: Left Antecubital; Blood  Result Value Ref Range Status   Specimen Description   Final    LEFT ANTECUBITAL BOTTLES DRAWN AEROBIC AND ANAEROBIC Performed at [facility], [address] [place], [place] [address]    Special Requests   Final    Blood Culture adequate volume Performed at [facility], [address] [place], [place] [address]    Culture  Setup Time   Final    GRAM POSITIVE COCCI ANAEROBIC BOTTLE ONLY Gram Stain Report Called to,Read Back By and Verified With: D TUCHMAN AT 1238 ON [id number] BY S DALTON CRITICAL RESULT CALLED TO, READ BACK BY AND VERIFIED WITH: S RICHARDSON RN 06/17/23 @ 0106 BY AB    Culture (A)  Final    STAPHYLOCOCCUS EPIDERMIDIS THE SIGNIFICANCE OF ISOLATING THIS ORGANISM FROM A SINGLE SET OF BLOOD CULTURES WHEN MULTIPLE SETS ARE DRAWN IS UNCERTAIN. PLEASE NOTIFY THE MICROBIOLOGY DEPARTMENT WITHIN ONE WEEK IF SPECIATION AND SENSITIVITIES ARE REQUIRED. Performed at [facility], [address], [place], [place] [address]    Report Status 06/18/2023 FINAL  Final  Blood Culture ID Panel (Reflexed)     Status: Abnormal   Collection Time: 06/15/23 12:09 PM  Result Value Ref Range Status   Enterococcus faecalis NOT DETECTED NOT DETECTED Final   Enterococcus Faecium NOT DETECTED NOT DETECTED Final   Listeria monocytogenes NOT DETECTED NOT DETECTED Final   Staphylococcus species DETECTED (A) NOT DETECTED Final    Comment: CRITICAL RESULT CALLED TO, READ BACK BY AND VERIFIED  WITH: Berenda Morale RN 06/17/23 @ 0106 BY AB    Staphylococcus aureus (BCID) NOT DETECTED NOT DETECTED Final   Staphylococcus epidermidis DETECTED (A) NOT DETECTED Final    Comment: Methicillin (oxacillin) resistant coagulase negative staphylococcus. Possible blood culture contaminant (unless isolated from more than one blood culture draw or clinical  case suggests pathogenicity). No antibiotic treatment is indicated for blood  culture contaminants. CRITICAL RESULT CALLED TO, READ BACK BY AND VERIFIED WITH: S RICHARDSON RN 06/17/23 @ 0106 BY AB    Staphylococcus lugdunensis NOT DETECTED NOT DETECTED Final   Streptococcus species NOT DETECTED NOT DETECTED Final   Streptococcus agalactiae NOT DETECTED NOT DETECTED Final   Streptococcus pneumoniae NOT DETECTED NOT DETECTED Final   Streptococcus pyogenes NOT DETECTED NOT DETECTED Final   A.calcoaceticus-baumannii NOT DETECTED NOT DETECTED Final   Bacteroides fragilis NOT DETECTED NOT DETECTED Final   Enterobacterales NOT DETECTED NOT DETECTED Final   Enterobacter cloacae complex NOT DETECTED NOT DETECTED Final   Escherichia coli NOT DETECTED NOT DETECTED Final   Klebsiella aerogenes NOT DETECTED NOT DETECTED Final   Klebsiella oxytoca NOT DETECTED NOT DETECTED Final   Klebsiella pneumoniae NOT DETECTED NOT DETECTED Final   Proteus species NOT DETECTED NOT DETECTED Final   Salmonella species NOT DETECTED NOT DETECTED Final   Serratia marcescens NOT DETECTED NOT DETECTED Final   Haemophilus influenzae NOT DETECTED NOT DETECTED Final   Neisseria meningitidis NOT DETECTED NOT DETECTED Final   Pseudomonas aeruginosa NOT DETECTED NOT DETECTED Final   Stenotrophomonas maltophilia NOT DETECTED NOT DETECTED Final   Candida albicans NOT DETECTED NOT DETECTED Final   Candida auris NOT DETECTED NOT DETECTED Final   Candida glabrata NOT DETECTED NOT DETECTED Final   Candida krusei NOT DETECTED NOT DETECTED Final   Candida parapsilosis NOT DETECTED NOT DETECTED Final   Candida tropicalis NOT DETECTED NOT DETECTED Final   Cryptococcus neoformans/gattii NOT DETECTED NOT DETECTED Final   Methicillin resistance mecA/C DETECTED (A) NOT DETECTED Final    Comment: CRITICAL RESULT CALLED TO, READ BACK BY AND VERIFIED WITH: S RICHARDSON RN 06/17/23 @ 0106 BY AB Performed at [facility], [address] [place], [place] [address]     Radiology Studies: No results found.  Scheduled Meds:  aspirin EC  81 mg Oral Daily   hydrALAZINE  25 mg Oral BID   metoprolol succinate  12.5 mg Oral Daily   pantoprazole  40 mg Oral Daily   potassium chloride  20 mEq Oral Daily   Rivaroxaban  15 mg Oral Q supper   rosuvastatin  40 mg Oral Daily   sodium chloride flush  3 mL Intravenous Q12H   sodium chloride flush  3 mL Intravenous Q12H   torsemide  20 mg Oral Daily   Continuous Infusions:  meropenem (MERREM) IV 1 g (06/19/23 1044)    LOS: 4 days   Vassie Loll M.D on 06/19/2023 at 4:26 PM  Go to [email or web address] - for contact info  [facility] - Office  [phone]  If 7PM-7AM, please contact night-coverage [email or web address] 06/19/2023, 4:26 PM

## 2023-06-19 NOTE — Progress Notes (Signed)
   06/19/23 2306  Vitals  Temp 99.3 F (37.4 C)  BP (!) 119/56  MAP (mmHg) 75  BP Location Left Arm  BP Method Automatic  Patient Position (if appropriate) Lying  Pulse Rate 91  Pulse Rate Source Monitor  Resp 20  MEWS COLOR  MEWS Score Color Green  Oxygen Therapy  SpO2 95 %  O2 Device Room Air  MEWS Score  MEWS Temp 0  MEWS Systolic 0  MEWS Pulse 0  MEWS RR 0  MEWS LOC 0  MEWS Score 0  Provider Notification  Provider Name/Title Zierle-Ghosh  Date Provider Notified 06/19/23  Time Provider Notified 2317  Method of Notification Page  Notification Reason Other (Comment) (yellow muse pt temp improved)  Provider response No new orders

## 2023-06-19 NOTE — Plan of Care (Signed)
  Problem: Education: Goal: Knowledge of secondary prevention will improve (MUST DOCUMENT ALL) Outcome: Progressing Goal: Knowledge of patient specific risk factors will improve Loraine Leriche N/A or DELETE if not current risk factor) Outcome: Progressing   Problem: Coping: Goal: Will identify appropriate support needs Outcome: Progressing

## 2023-06-19 NOTE — Progress Notes (Signed)
   06/19/23 2005  Vitals  Temp (!) 101.6 F (38.7 C)  Temp Source Oral  BP (!) 124/58  MAP (mmHg) 78  BP Location Left Arm  BP Method Automatic  Patient Position (if appropriate) Lying  Pulse Rate 93  Pulse Rate Source Dinamap  Level of Consciousness  Level of Consciousness Alert  MEWS COLOR  MEWS Score Color Yellow  Oxygen Therapy  SpO2 99 %  O2 Device Room Air  MEWS Score  MEWS Temp 2  MEWS Systolic 0  MEWS Pulse 0  MEWS RR 0  MEWS LOC 0  MEWS Score 2  Provider Notification  Provider Name/Title Zierle-Ghosh  Date Provider Notified 06/19/23  Time Provider Notified 2014  Method of Notification Page  Notification Reason Other (Comment) (yellow mews)  Provider response No new orders  Date of Provider Response 06/19/23  Time of Provider Response 2014

## 2023-06-20 ENCOUNTER — Ambulatory Visit: Payer: Medicare HMO | Admitting: Internal Medicine

## 2023-06-20 DIAGNOSIS — I10 Essential (primary) hypertension: Secondary | ICD-10-CM | POA: Diagnosis not present

## 2023-06-20 DIAGNOSIS — I2510 Atherosclerotic heart disease of native coronary artery without angina pectoris: Secondary | ICD-10-CM | POA: Diagnosis not present

## 2023-06-20 DIAGNOSIS — N1832 Chronic kidney disease, stage 3b: Secondary | ICD-10-CM | POA: Diagnosis not present

## 2023-06-20 DIAGNOSIS — I480 Paroxysmal atrial fibrillation: Secondary | ICD-10-CM | POA: Diagnosis not present

## 2023-06-20 DIAGNOSIS — I6782 Cerebral ischemia: Secondary | ICD-10-CM | POA: Diagnosis not present

## 2023-06-20 DIAGNOSIS — Z7389 Other problems related to life management difficulty: Secondary | ICD-10-CM | POA: Diagnosis not present

## 2023-06-20 DIAGNOSIS — N179 Acute kidney failure, unspecified: Secondary | ICD-10-CM | POA: Diagnosis not present

## 2023-06-20 DIAGNOSIS — R2681 Unsteadiness on feet: Secondary | ICD-10-CM | POA: Diagnosis not present

## 2023-06-20 DIAGNOSIS — L89152 Pressure ulcer of sacral region, stage 2: Secondary | ICD-10-CM | POA: Diagnosis not present

## 2023-06-20 DIAGNOSIS — G9341 Metabolic encephalopathy: Secondary | ICD-10-CM | POA: Diagnosis not present

## 2023-06-20 DIAGNOSIS — G4700 Insomnia, unspecified: Secondary | ICD-10-CM | POA: Diagnosis not present

## 2023-06-20 DIAGNOSIS — R918 Other nonspecific abnormal finding of lung field: Secondary | ICD-10-CM | POA: Diagnosis not present

## 2023-06-20 DIAGNOSIS — M549 Dorsalgia, unspecified: Secondary | ICD-10-CM | POA: Diagnosis not present

## 2023-06-20 DIAGNOSIS — Z743 Need for continuous supervision: Secondary | ICD-10-CM | POA: Diagnosis not present

## 2023-06-20 DIAGNOSIS — K219 Gastro-esophageal reflux disease without esophagitis: Secondary | ICD-10-CM | POA: Diagnosis not present

## 2023-06-20 DIAGNOSIS — N390 Urinary tract infection, site not specified: Secondary | ICD-10-CM | POA: Diagnosis not present

## 2023-06-20 DIAGNOSIS — Z1152 Encounter for screening for COVID-19: Secondary | ICD-10-CM | POA: Diagnosis not present

## 2023-06-20 DIAGNOSIS — I130 Hypertensive heart and chronic kidney disease with heart failure and stage 1 through stage 4 chronic kidney disease, or unspecified chronic kidney disease: Secondary | ICD-10-CM | POA: Diagnosis not present

## 2023-06-20 DIAGNOSIS — Z79899 Other long term (current) drug therapy: Secondary | ICD-10-CM | POA: Diagnosis not present

## 2023-06-20 DIAGNOSIS — R630 Anorexia: Secondary | ICD-10-CM | POA: Diagnosis not present

## 2023-06-20 DIAGNOSIS — R0689 Other abnormalities of breathing: Secondary | ICD-10-CM | POA: Diagnosis not present

## 2023-06-20 DIAGNOSIS — R2689 Other abnormalities of gait and mobility: Secondary | ICD-10-CM | POA: Diagnosis not present

## 2023-06-20 DIAGNOSIS — R4182 Altered mental status, unspecified: Secondary | ICD-10-CM | POA: Diagnosis not present

## 2023-06-20 DIAGNOSIS — Z7982 Long term (current) use of aspirin: Secondary | ICD-10-CM | POA: Diagnosis not present

## 2023-06-20 DIAGNOSIS — G4733 Obstructive sleep apnea (adult) (pediatric): Secondary | ICD-10-CM | POA: Diagnosis not present

## 2023-06-20 DIAGNOSIS — R410 Disorientation, unspecified: Secondary | ICD-10-CM | POA: Diagnosis not present

## 2023-06-20 DIAGNOSIS — R531 Weakness: Principal | ICD-10-CM

## 2023-06-20 DIAGNOSIS — I5033 Acute on chronic diastolic (congestive) heart failure: Secondary | ICD-10-CM | POA: Diagnosis not present

## 2023-06-20 DIAGNOSIS — I5032 Chronic diastolic (congestive) heart failure: Secondary | ICD-10-CM | POA: Diagnosis not present

## 2023-06-20 DIAGNOSIS — D631 Anemia in chronic kidney disease: Secondary | ICD-10-CM | POA: Diagnosis not present

## 2023-06-20 DIAGNOSIS — M6281 Muscle weakness (generalized): Secondary | ICD-10-CM | POA: Diagnosis not present

## 2023-06-20 DIAGNOSIS — K449 Diaphragmatic hernia without obstruction or gangrene: Secondary | ICD-10-CM | POA: Diagnosis not present

## 2023-06-20 LAB — CULTURE, BLOOD (ROUTINE X 2): Special Requests: ADEQUATE

## 2023-06-20 MED ORDER — FOSFOMYCIN TROMETHAMINE 3 G PO PACK: 3.0000 g | PACK | Freq: Once | ORAL | Status: DC

## 2023-06-20 NOTE — Progress Notes (Signed)
Ng Discharge Note  Admit Date:  06/14/2023 Discharge date: 06/20/2023   [name] to be D/C'd Skilled nursing facility per MD order.  AVS completed. Patient/caregiver able to verbalize understanding.  Discharge Medication: Allergies as of 06/20/2023       Reactions   Atorvastatin Nausea And Vomiting   Contrast Media [iodinated Contrast Media] Other (See Comments)   Nausea/Vomiting and Shortness of Breath   Darvon [propoxyphene] Nausea And Vomiting   Codeine Nausea And Vomiting, Anxiety        Medication List     TAKE these medications    acetaminophen 500 MG tablet Commonly known as: TYLENOL Take 2 tablets (1,000 mg total) by mouth every 6 (six) hours as needed for mild pain or moderate pain.   aspirin EC 81 MG tablet Take 1 tablet (81 mg total) by mouth daily. Swallow whole.   Biofreeze Roll-On 4 % Gel Generic drug: Menthol (Topical Analgesic) Apply 1 application. topically daily as needed (pain).   Biofreeze 10 % Crea Generic drug: Menthol (Topical Analgesic) Apply 1 application  topically in the morning and at bedtime.   ergocalciferol 1.25 MG (50000 UT) capsule Commonly known as: VITAMIN D2 Take 50,000 Units by mouth every 30 (thirty) days.   hydrALAZINE 25 MG tablet Commonly known as: APRESOLINE Take 1 tablet (25 mg total) by mouth in the morning and at bedtime.   metoprolol succinate 25 MG 24 hr tablet Commonly known as: Toprol XL Take 0.5 tablets (12.5 mg total) by mouth daily.   nitroGLYCERIN 0.4 MG SL tablet Commonly known as: NITROSTAT PLACE 1 TABLET UNDER THE TONGUE EVERY 5 MINUTES AS NEEDED.   pantoprazole 40 MG tablet Commonly known as: PROTONIX TAKE 1 TABLET BY MOUTH EVERY DAY   potassium chloride 20 MEQ packet Commonly known as: KLOR-CON Take 20 mEq by mouth daily.   Rivaroxaban 15 MG Tabs tablet Commonly known as: Xarelto Take 1 tablet (15 mg total) by mouth daily with supper.   rosuvastatin 40 MG tablet Commonly known as:  CRESTOR TAKE 1 TABLET BY MOUTH EVERY DAY   torsemide 20 MG tablet Commonly known as: DEMADEX Take 1 tablet (20 mg total) by mouth daily.               Discharge Care Instructions  (From admission, onward)           Start     Ordered   06/20/23 0000  Discharge wound care:       Comments: Keep area clean and dry; constant repositioning and continue using preventive measures.   06/20/23 1208            Discharge Assessment: Vitals:   06/20/23 0341 06/20/23 0908  BP: (!) 131/53 (!) 122/58  Pulse: 87 83  Resp: 20   Temp: 98.7 F (37.1 C) 98.2 F (36.8 C)  SpO2: 97% 97%   Skin clean, dry and intact without evidence of skin break down, no evidence of skin tears noted. IV catheter discontinued intact. Site without signs and symptoms of complications - no redness or edema noted at insertion site, patient denies c/o pain - only slight tenderness at site.  Dressing with slight pressure applied.  D/c Instructions-Education: Discharge instructions given to patient/family with verbalized understanding. D/c education completed with patient/family including follow up instructions, medication list, d/c activities limitations if indicated, with other d/c instructions as indicated by MD - patient able to verbalize understanding, all questions fully answered. Patient instructed to return to ED, call 911, or  call MD for any changes in condition.  Patient escorted via stretcher by EMS staff for transport to [facility].  Cristal Ford, LPN 16/06/9603 5:40 PM

## 2023-06-20 NOTE — Progress Notes (Signed)
Report given to Baxter Hire, nurse at [facility], no further questions at this time.

## 2023-06-20 NOTE — Discharge Summary (Signed)
Ordered   06/20/23 0000  Discharge wound care:       Comments: Keep area clean and dry; constant repositioning and continue using preventive measures.   06/20/23 1208            Contact information for follow-up providers     [name], MD. Schedule an appointment as soon as possible for a visit in 10 day(s).   Specialty: Internal Medicine Why: After discharge from the skilled nursing  facility. Contact information: [address] [place] [address] [phone]              Contact information for after-discharge care     Destination     [facility] .   Service: Skilled Nursing Contact information: [address] [place] [address] [phone]                    Discharge Exam: [name] Weights   06/14/23 0432  Weight: 90.5 kg   General exam: Alert, awake, oriented x 3; no chest pain, no nausea, no vomiting.  Feeling better but expressing weakness and deconditioning. Respiratory system: Clear to auscultation. Respiratory effort normal.  No using accessory muscles.  Good saturation on room air. Cardiovascular system: Rate controlled, no rubs, no gallops, no JVD. Gastrointestinal system: Abdomen is obese, nondistended, soft and nontender. No organomegaly or masses felt. Normal bowel sounds heard. Central nervous system: Alert and oriented. No focal neurological deficits. Extremities: No cyanosis or clubbing; patient reports pain on her knees bilaterally. Skin: No petechiae. Psychiatry:  Mood & affect appropriate.   Condition at discharge: Stable and improved.  The results of significant diagnostics from this hospitalization (including imaging, microbiology, ancillary and laboratory) are listed below for reference.   Imaging Studies: DG Chest Port 1 View  Result Date: 06/14/2023 CLINICAL DATA:  89 year old female with history of shortness of breath. EXAM: PORTABLE CHEST 1 VIEW COMPARISON:  Chest x-ray 06/10/2023. FINDINGS: Lung volumes are normal. No consolidative airspace disease. Mild blunting of the costophrenic sulci may suggest chronic pleuroparenchymal scarring although trace bilateral pleural effusions are not excluded. No pneumothorax. No evidence of pulmonary edema. Heart size is mildly enlarged. Retrocardiac lucency indicative of a large hiatal hernia. Upper mediastinal contours are  within normal limits. Electronic device projecting over the left hemithorax, presumably an implantable loop recorder. IMPRESSION: 1. Possible trace bilateral pleural effusions versus chronic basilar pleuroparenchymal scarring. No other acute findings. 2. Cardiomegaly. 3. Large hiatal hernia. Electronically Signed   By: Trudie Reed M.D.   On: 06/14/2023 05:50   VAS [place] ABI WITH/WO TBI  Result Date: 06/12/2023  LOWER EXTREMITY DOPPLER STUDY Patient Name:  [name]  Date of Exam:   06/10/2023 Medical Rec #: [id number]       Accession #:    [id number] Date of Birth: 1934-05-12       Patient Gender: F Patient Age:   89 years Exam Location:  [facility] Procedure:      VAS [place] ABI WITH/WO TBI Referring Phys: --------------------------------------------------------------------------------  Indications: Rest pain. Cold foot High Risk Factors: Hypertension, coronary artery disease, prior CVA.  Performing Technologist: Shona Simpson  Examination Guidelines: A complete evaluation includes at minimum, Doppler waveform signals and systolic blood pressure reading at the level of bilateral brachial, anterior tibial, and posterior tibial arteries, when vessel segments are accessible. Bilateral testing is considered an integral part of a complete examination. Photoelectric Plethysmograph (PPG) waveforms and toe systolic pressure readings  Ordered   06/20/23 0000  Discharge wound care:       Comments: Keep area clean and dry; constant repositioning and continue using preventive measures.   06/20/23 1208            Contact information for follow-up providers     [name], MD. Schedule an appointment as soon as possible for a visit in 10 day(s).   Specialty: Internal Medicine Why: After discharge from the skilled nursing  facility. Contact information: [address] [place] [address] [phone]              Contact information for after-discharge care     Destination     [facility] .   Service: Skilled Nursing Contact information: [address] [place] [address] [phone]                    Discharge Exam: [name] Weights   06/14/23 0432  Weight: 90.5 kg   General exam: Alert, awake, oriented x 3; no chest pain, no nausea, no vomiting.  Feeling better but expressing weakness and deconditioning. Respiratory system: Clear to auscultation. Respiratory effort normal.  No using accessory muscles.  Good saturation on room air. Cardiovascular system: Rate controlled, no rubs, no gallops, no JVD. Gastrointestinal system: Abdomen is obese, nondistended, soft and nontender. No organomegaly or masses felt. Normal bowel sounds heard. Central nervous system: Alert and oriented. No focal neurological deficits. Extremities: No cyanosis or clubbing; patient reports pain on her knees bilaterally. Skin: No petechiae. Psychiatry:  Mood & affect appropriate.   Condition at discharge: Stable and improved.  The results of significant diagnostics from this hospitalization (including imaging, microbiology, ancillary and laboratory) are listed below for reference.   Imaging Studies: DG Chest Port 1 View  Result Date: 06/14/2023 CLINICAL DATA:  89 year old female with history of shortness of breath. EXAM: PORTABLE CHEST 1 VIEW COMPARISON:  Chest x-ray 06/10/2023. FINDINGS: Lung volumes are normal. No consolidative airspace disease. Mild blunting of the costophrenic sulci may suggest chronic pleuroparenchymal scarring although trace bilateral pleural effusions are not excluded. No pneumothorax. No evidence of pulmonary edema. Heart size is mildly enlarged. Retrocardiac lucency indicative of a large hiatal hernia. Upper mediastinal contours are  within normal limits. Electronic device projecting over the left hemithorax, presumably an implantable loop recorder. IMPRESSION: 1. Possible trace bilateral pleural effusions versus chronic basilar pleuroparenchymal scarring. No other acute findings. 2. Cardiomegaly. 3. Large hiatal hernia. Electronically Signed   By: Trudie Reed M.D.   On: 06/14/2023 05:50   VAS [place] ABI WITH/WO TBI  Result Date: 06/12/2023  LOWER EXTREMITY DOPPLER STUDY Patient Name:  [name]  Date of Exam:   06/10/2023 Medical Rec #: [id number]       Accession #:    [id number] Date of Birth: 1934-05-12       Patient Gender: F Patient Age:   89 years Exam Location:  [facility] Procedure:      VAS [place] ABI WITH/WO TBI Referring Phys: --------------------------------------------------------------------------------  Indications: Rest pain. Cold foot High Risk Factors: Hypertension, coronary artery disease, prior CVA.  Performing Technologist: Shona Simpson  Examination Guidelines: A complete evaluation includes at minimum, Doppler waveform signals and systolic blood pressure reading at the level of bilateral brachial, anterior tibial, and posterior tibial arteries, when vessel segments are accessible. Bilateral testing is considered an integral part of a complete examination. Photoelectric Plethysmograph (PPG) waveforms and toe systolic pressure readings  Ordered   06/20/23 0000  Discharge wound care:       Comments: Keep area clean and dry; constant repositioning and continue using preventive measures.   06/20/23 1208            Contact information for follow-up providers     [name], MD. Schedule an appointment as soon as possible for a visit in 10 day(s).   Specialty: Internal Medicine Why: After discharge from the skilled nursing  facility. Contact information: [address] [place] [address] [phone]              Contact information for after-discharge care     Destination     [facility] .   Service: Skilled Nursing Contact information: [address] [place] [address] [phone]                    Discharge Exam: [name] Weights   06/14/23 0432  Weight: 90.5 kg   General exam: Alert, awake, oriented x 3; no chest pain, no nausea, no vomiting.  Feeling better but expressing weakness and deconditioning. Respiratory system: Clear to auscultation. Respiratory effort normal.  No using accessory muscles.  Good saturation on room air. Cardiovascular system: Rate controlled, no rubs, no gallops, no JVD. Gastrointestinal system: Abdomen is obese, nondistended, soft and nontender. No organomegaly or masses felt. Normal bowel sounds heard. Central nervous system: Alert and oriented. No focal neurological deficits. Extremities: No cyanosis or clubbing; patient reports pain on her knees bilaterally. Skin: No petechiae. Psychiatry:  Mood & affect appropriate.   Condition at discharge: Stable and improved.  The results of significant diagnostics from this hospitalization (including imaging, microbiology, ancillary and laboratory) are listed below for reference.   Imaging Studies: DG Chest Port 1 View  Result Date: 06/14/2023 CLINICAL DATA:  89 year old female with history of shortness of breath. EXAM: PORTABLE CHEST 1 VIEW COMPARISON:  Chest x-ray 06/10/2023. FINDINGS: Lung volumes are normal. No consolidative airspace disease. Mild blunting of the costophrenic sulci may suggest chronic pleuroparenchymal scarring although trace bilateral pleural effusions are not excluded. No pneumothorax. No evidence of pulmonary edema. Heart size is mildly enlarged. Retrocardiac lucency indicative of a large hiatal hernia. Upper mediastinal contours are  within normal limits. Electronic device projecting over the left hemithorax, presumably an implantable loop recorder. IMPRESSION: 1. Possible trace bilateral pleural effusions versus chronic basilar pleuroparenchymal scarring. No other acute findings. 2. Cardiomegaly. 3. Large hiatal hernia. Electronically Signed   By: Trudie Reed M.D.   On: 06/14/2023 05:50   VAS [place] ABI WITH/WO TBI  Result Date: 06/12/2023  LOWER EXTREMITY DOPPLER STUDY Patient Name:  [name]  Date of Exam:   06/10/2023 Medical Rec #: [id number]       Accession #:    [id number] Date of Birth: 1934-05-12       Patient Gender: F Patient Age:   89 years Exam Location:  [facility] Procedure:      VAS [place] ABI WITH/WO TBI Referring Phys: --------------------------------------------------------------------------------  Indications: Rest pain. Cold foot High Risk Factors: Hypertension, coronary artery disease, prior CVA.  Performing Technologist: Shona Simpson  Examination Guidelines: A complete evaluation includes at minimum, Doppler waveform signals and systolic blood pressure reading at the level of bilateral brachial, anterior tibial, and posterior tibial arteries, when vessel segments are accessible. Bilateral testing is considered an integral part of a complete examination. Photoelectric Plethysmograph (PPG) waveforms and toe systolic pressure readings  are included as required and additional duplex testing as needed. Limited examinations for reoccurring indications may be performed as noted.  ABI Findings: +---------+------------------+-----+----------+--------+ Right    Rt Pressure (mmHg)IndexWaveform  Comment  +---------+------------------+-----+----------+--------+ Brachial 162                    triphasic          +---------+------------------+-----+----------+--------+ PTA      197               1.22 biphasic           +---------+------------------+-----+----------+--------+ DP       187                1.15 monophasic         +---------+------------------+-----+----------+--------+ Great Toe102               0.63 Abnormal           +---------+------------------+-----+----------+--------+ +---------+------------------+-----+---------+-------+ Left     Lt Pressure (mmHg)IndexWaveform Comment +---------+------------------+-----+---------+-------+ Brachial 141                    triphasic        +---------+------------------+-----+---------+-------+ PTA      182               1.12 triphasic        +---------+------------------+-----+---------+-------+ DP       173               1.07 biphasic         +---------+------------------+-----+---------+-------+ Ernst Spell               0.76 Normal           +---------+------------------+-----+---------+-------+ +-------+-----------+-----------+------------+------------+ ABI/TBIToday's ABIToday's TBIPrevious ABIPrevious TBI +-------+-----------+-----------+------------+------------+ Right  1.22       0.63                                +-------+-----------+-----------+------------+------------+ Left   1.12       0.76                                +-------+-----------+-----------+------------+------------+  Summary: Right: Resting right ankle-brachial index is within normal range. The right toe-brachial index is normal. Although ankle brachial indices are within normal limits (0.95-1.29), arterial Doppler waveforms at the ankle suggest some component of arterial occlusive disease. Left: Resting left ankle-brachial index is within normal range. The left toe-brachial index is normal. *See table(s) above for measurements and observations.  Electronically signed by Sherald Hess MD on 06/12/2023 at 9:26:57 AM.    Final    VAS [place] LOWER EXTREMITY VENOUS (DVT)  Result Date: 06/10/2023  Lower Venous DVT Study Patient Name:  [name]  Date of Exam:   06/10/2023 Medical Rec #: [id number]       Accession #:     [id number] Date of Birth: 1933/09/27       Patient Gender: F Patient Age:   49 years Exam Location:  [facility] Procedure:      VAS [place] LOWER EXTREMITY VENOUS (DVT) Referring Phys: DAN FLOYD --------------------------------------------------------------------------------  Indications: Pain.  Risk Factors: None identified. Limitations: Body habitus, poor ultrasound/tissue interface and patient pain tolerance. Comparison Study: No prior studies. Performing Technologist: Chanda Busing RVT  Examination Guidelines: A complete evaluation includes B-mode imaging, spectral Doppler, color Doppler, and power  Ordered   06/20/23 0000  Discharge wound care:       Comments: Keep area clean and dry; constant repositioning and continue using preventive measures.   06/20/23 1208            Contact information for follow-up providers     [name], MD. Schedule an appointment as soon as possible for a visit in 10 day(s).   Specialty: Internal Medicine Why: After discharge from the skilled nursing  facility. Contact information: [address] [place] [address] [phone]              Contact information for after-discharge care     Destination     [facility] .   Service: Skilled Nursing Contact information: [address] [place] [address] [phone]                    Discharge Exam: [name] Weights   06/14/23 0432  Weight: 90.5 kg   General exam: Alert, awake, oriented x 3; no chest pain, no nausea, no vomiting.  Feeling better but expressing weakness and deconditioning. Respiratory system: Clear to auscultation. Respiratory effort normal.  No using accessory muscles.  Good saturation on room air. Cardiovascular system: Rate controlled, no rubs, no gallops, no JVD. Gastrointestinal system: Abdomen is obese, nondistended, soft and nontender. No organomegaly or masses felt. Normal bowel sounds heard. Central nervous system: Alert and oriented. No focal neurological deficits. Extremities: No cyanosis or clubbing; patient reports pain on her knees bilaterally. Skin: No petechiae. Psychiatry:  Mood & affect appropriate.   Condition at discharge: Stable and improved.  The results of significant diagnostics from this hospitalization (including imaging, microbiology, ancillary and laboratory) are listed below for reference.   Imaging Studies: DG Chest Port 1 View  Result Date: 06/14/2023 CLINICAL DATA:  89 year old female with history of shortness of breath. EXAM: PORTABLE CHEST 1 VIEW COMPARISON:  Chest x-ray 06/10/2023. FINDINGS: Lung volumes are normal. No consolidative airspace disease. Mild blunting of the costophrenic sulci may suggest chronic pleuroparenchymal scarring although trace bilateral pleural effusions are not excluded. No pneumothorax. No evidence of pulmonary edema. Heart size is mildly enlarged. Retrocardiac lucency indicative of a large hiatal hernia. Upper mediastinal contours are  within normal limits. Electronic device projecting over the left hemithorax, presumably an implantable loop recorder. IMPRESSION: 1. Possible trace bilateral pleural effusions versus chronic basilar pleuroparenchymal scarring. No other acute findings. 2. Cardiomegaly. 3. Large hiatal hernia. Electronically Signed   By: Trudie Reed M.D.   On: 06/14/2023 05:50   VAS [place] ABI WITH/WO TBI  Result Date: 06/12/2023  LOWER EXTREMITY DOPPLER STUDY Patient Name:  [name]  Date of Exam:   06/10/2023 Medical Rec #: [id number]       Accession #:    [id number] Date of Birth: 1934-05-12       Patient Gender: F Patient Age:   89 years Exam Location:  [facility] Procedure:      VAS [place] ABI WITH/WO TBI Referring Phys: --------------------------------------------------------------------------------  Indications: Rest pain. Cold foot High Risk Factors: Hypertension, coronary artery disease, prior CVA.  Performing Technologist: Shona Simpson  Examination Guidelines: A complete evaluation includes at minimum, Doppler waveform signals and systolic blood pressure reading at the level of bilateral brachial, anterior tibial, and posterior tibial arteries, when vessel segments are accessible. Bilateral testing is considered an integral part of a complete examination. Photoelectric Plethysmograph (PPG) waveforms and toe systolic pressure readings  Physician Discharge Summary   Patient: [name] MRN: [id number] DOB: December 17, 1933  Admit date:     06/14/2023  Discharge date: 06/20/23  Discharge Physician: Vassie Loll   PCP: [name], MD   Recommendations at discharge:  Repeat basic metabolic panel to follow electrolytes and renal function Repeat CBC to follow hemoglobin trend/WBCs and stability Reassess blood pressure and adjust antihypertensive treatment as needed Continue assisting patient with weight loss management. Outpatient goals of care and advance care planning discussion recommended.  Discharge Diagnoses: Principal Problem:   Acute metabolic encephalopathy Active Problems:   UTI (urinary tract infection)   Essential hypertension   Obstructive sleep apnea   Chronic kidney disease, stage 3b (HCC)   Paroxysmal atrial fibrillation (HCC)   CKD stage 3b, GFR 30-44 ml/min (HCC)   Pressure injury of skin   AKI (acute kidney injury) (HCC)   Weakness   Brief Hospital admission course: As per H&P written by Dr. Mariea Clonts on 06/15/2023 [name]  is a 89 y.o. female with Pmhx of HFpEF (LVEF 65-70% July 2024), NSTEMI 2018, CAD s/p stent placement, CVA (2023), CKD stage IIIb, PAfib, HLD, , HTN recently treated at [facility] health internal medicine residency inpatient service from 06/10/2023 to 06/12/2023 -Patient was found to be weak prior to discharge evaluated by physical therapy patient and family declined SNF placement -When patient arrived home on 06/12/2023 required wheelchair to get from the car into the house she could not ambulate -When she woke up on 06/14/2023 she had significant difficulties with mobility related ADLs- -family brought her back to the ED on 06/14/2023 due to worsening weakness and lower extremity edema as well as increasing creatinine --Plan was to place at an SNF facility--TOC/social workers were working on this when she spiked a fever of 101 on 06/15/2023 with UA suggestive of UTI, patient was  found to be more lethargic, somewhat forgetful/confused and nauseous consistent with acute metabolic encephalopathy due to presumed UTI -EDP requested evaluation by hospitalist service for possible admission -Blood and urine cultures requested -WBC is 10.0, Hgb 10.6 platelets 330 --sodium is 134-creatinine is 1.51 down from 1.83 on 06/14/2023 baseline creatinine usually around 1.2-1.3  Assessment and Plan: 1) acute metabolic encephalopathy--- secondary to E. coli UTI -Mentation is back to baseline according to patient's son and daughters- -continue constant reorientation and supportive care.   2)ESBL  E. coli UTI---- -initially on IV Rocephin and subsequently transition on 06/16/2023 to IV meropenem. -Patient received IV meropenem and while hospitalized and at time of discharge 1 dose of fosfomycin to finalize antibiotic therapy. -Blood cultures with staph epi most likely contaminant and no further treatment required.   3)Generalized weakness and Deconditioning--patient with significant mobility related challenges---due to ESBL E. coli UTI infection -Patient was found to be weak prior to discharge evaluated by physical therapy patient and family declined SNF placement on 06/12/2023 prior to discharge from inpatient service [facility] -When patient arrived home on 06/12/2023 required wheelchair to get from the car into the house she could not ambulate -When she woke up on 06/14/2023 she had significant difficulties with mobility related ADLs- -Pt is unable to perform mobility related ADLs -PT eval appreciated recommends SNF rehab --Patient is medically stable for transfer to SNF rehab -Safely discharge to [facility] for further rehabilitation and care.   4)CAD s/p DES x2 in 2011 NSTEMI s/p DES x1 in 2018 Mixed HLD Hx CVA 08/2022 Patient with extensive history of NSTEMI, stents, and stroke.  -Chest wall tenderness on  Physician Discharge Summary   Patient: [name] MRN: [id number] DOB: December 17, 1933  Admit date:     06/14/2023  Discharge date: 06/20/23  Discharge Physician: Vassie Loll   PCP: [name], MD   Recommendations at discharge:  Repeat basic metabolic panel to follow electrolytes and renal function Repeat CBC to follow hemoglobin trend/WBCs and stability Reassess blood pressure and adjust antihypertensive treatment as needed Continue assisting patient with weight loss management. Outpatient goals of care and advance care planning discussion recommended.  Discharge Diagnoses: Principal Problem:   Acute metabolic encephalopathy Active Problems:   UTI (urinary tract infection)   Essential hypertension   Obstructive sleep apnea   Chronic kidney disease, stage 3b (HCC)   Paroxysmal atrial fibrillation (HCC)   CKD stage 3b, GFR 30-44 ml/min (HCC)   Pressure injury of skin   AKI (acute kidney injury) (HCC)   Weakness   Brief Hospital admission course: As per H&P written by Dr. Mariea Clonts on 06/15/2023 [name]  is a 89 y.o. female with Pmhx of HFpEF (LVEF 65-70% July 2024), NSTEMI 2018, CAD s/p stent placement, CVA (2023), CKD stage IIIb, PAfib, HLD, , HTN recently treated at [facility] health internal medicine residency inpatient service from 06/10/2023 to 06/12/2023 -Patient was found to be weak prior to discharge evaluated by physical therapy patient and family declined SNF placement -When patient arrived home on 06/12/2023 required wheelchair to get from the car into the house she could not ambulate -When she woke up on 06/14/2023 she had significant difficulties with mobility related ADLs- -family brought her back to the ED on 06/14/2023 due to worsening weakness and lower extremity edema as well as increasing creatinine --Plan was to place at an SNF facility--TOC/social workers were working on this when she spiked a fever of 101 on 06/15/2023 with UA suggestive of UTI, patient was  found to be more lethargic, somewhat forgetful/confused and nauseous consistent with acute metabolic encephalopathy due to presumed UTI -EDP requested evaluation by hospitalist service for possible admission -Blood and urine cultures requested -WBC is 10.0, Hgb 10.6 platelets 330 --sodium is 134-creatinine is 1.51 down from 1.83 on 06/14/2023 baseline creatinine usually around 1.2-1.3  Assessment and Plan: 1) acute metabolic encephalopathy--- secondary to E. coli UTI -Mentation is back to baseline according to patient's son and daughters- -continue constant reorientation and supportive care.   2)ESBL  E. coli UTI---- -initially on IV Rocephin and subsequently transition on 06/16/2023 to IV meropenem. -Patient received IV meropenem and while hospitalized and at time of discharge 1 dose of fosfomycin to finalize antibiotic therapy. -Blood cultures with staph epi most likely contaminant and no further treatment required.   3)Generalized weakness and Deconditioning--patient with significant mobility related challenges---due to ESBL E. coli UTI infection -Patient was found to be weak prior to discharge evaluated by physical therapy patient and family declined SNF placement on 06/12/2023 prior to discharge from inpatient service [facility] -When patient arrived home on 06/12/2023 required wheelchair to get from the car into the house she could not ambulate -When she woke up on 06/14/2023 she had significant difficulties with mobility related ADLs- -Pt is unable to perform mobility related ADLs -PT eval appreciated recommends SNF rehab --Patient is medically stable for transfer to SNF rehab -Safely discharge to [facility] for further rehabilitation and care.   4)CAD s/p DES x2 in 2011 NSTEMI s/p DES x1 in 2018 Mixed HLD Hx CVA 08/2022 Patient with extensive history of NSTEMI, stents, and stroke.  -Chest wall tenderness on  Physician Discharge Summary   Patient: [name] MRN: [id number] DOB: December 17, 1933  Admit date:     06/14/2023  Discharge date: 06/20/23  Discharge Physician: Vassie Loll   PCP: [name], MD   Recommendations at discharge:  Repeat basic metabolic panel to follow electrolytes and renal function Repeat CBC to follow hemoglobin trend/WBCs and stability Reassess blood pressure and adjust antihypertensive treatment as needed Continue assisting patient with weight loss management. Outpatient goals of care and advance care planning discussion recommended.  Discharge Diagnoses: Principal Problem:   Acute metabolic encephalopathy Active Problems:   UTI (urinary tract infection)   Essential hypertension   Obstructive sleep apnea   Chronic kidney disease, stage 3b (HCC)   Paroxysmal atrial fibrillation (HCC)   CKD stage 3b, GFR 30-44 ml/min (HCC)   Pressure injury of skin   AKI (acute kidney injury) (HCC)   Weakness   Brief Hospital admission course: As per H&P written by Dr. Mariea Clonts on 06/15/2023 [name]  is a 89 y.o. female with Pmhx of HFpEF (LVEF 65-70% July 2024), NSTEMI 2018, CAD s/p stent placement, CVA (2023), CKD stage IIIb, PAfib, HLD, , HTN recently treated at [facility] health internal medicine residency inpatient service from 06/10/2023 to 06/12/2023 -Patient was found to be weak prior to discharge evaluated by physical therapy patient and family declined SNF placement -When patient arrived home on 06/12/2023 required wheelchair to get from the car into the house she could not ambulate -When she woke up on 06/14/2023 she had significant difficulties with mobility related ADLs- -family brought her back to the ED on 06/14/2023 due to worsening weakness and lower extremity edema as well as increasing creatinine --Plan was to place at an SNF facility--TOC/social workers were working on this when she spiked a fever of 101 on 06/15/2023 with UA suggestive of UTI, patient was  found to be more lethargic, somewhat forgetful/confused and nauseous consistent with acute metabolic encephalopathy due to presumed UTI -EDP requested evaluation by hospitalist service for possible admission -Blood and urine cultures requested -WBC is 10.0, Hgb 10.6 platelets 330 --sodium is 134-creatinine is 1.51 down from 1.83 on 06/14/2023 baseline creatinine usually around 1.2-1.3  Assessment and Plan: 1) acute metabolic encephalopathy--- secondary to E. coli UTI -Mentation is back to baseline according to patient's son and daughters- -continue constant reorientation and supportive care.   2)ESBL  E. coli UTI---- -initially on IV Rocephin and subsequently transition on 06/16/2023 to IV meropenem. -Patient received IV meropenem and while hospitalized and at time of discharge 1 dose of fosfomycin to finalize antibiotic therapy. -Blood cultures with staph epi most likely contaminant and no further treatment required.   3)Generalized weakness and Deconditioning--patient with significant mobility related challenges---due to ESBL E. coli UTI infection -Patient was found to be weak prior to discharge evaluated by physical therapy patient and family declined SNF placement on 06/12/2023 prior to discharge from inpatient service [facility] -When patient arrived home on 06/12/2023 required wheelchair to get from the car into the house she could not ambulate -When she woke up on 06/14/2023 she had significant difficulties with mobility related ADLs- -Pt is unable to perform mobility related ADLs -PT eval appreciated recommends SNF rehab --Patient is medically stable for transfer to SNF rehab -Safely discharge to [facility] for further rehabilitation and care.   4)CAD s/p DES x2 in 2011 NSTEMI s/p DES x1 in 2018 Mixed HLD Hx CVA 08/2022 Patient with extensive history of NSTEMI, stents, and stroke.  -Chest wall tenderness on  are included as required and additional duplex testing as needed. Limited examinations for reoccurring indications may be performed as noted.  ABI Findings: +---------+------------------+-----+----------+--------+ Right    Rt Pressure (mmHg)IndexWaveform  Comment  +---------+------------------+-----+----------+--------+ Brachial 162                    triphasic          +---------+------------------+-----+----------+--------+ PTA      197               1.22 biphasic           +---------+------------------+-----+----------+--------+ DP       187                1.15 monophasic         +---------+------------------+-----+----------+--------+ Great Toe102               0.63 Abnormal           +---------+------------------+-----+----------+--------+ +---------+------------------+-----+---------+-------+ Left     Lt Pressure (mmHg)IndexWaveform Comment +---------+------------------+-----+---------+-------+ Brachial 141                    triphasic        +---------+------------------+-----+---------+-------+ PTA      182               1.12 triphasic        +---------+------------------+-----+---------+-------+ DP       173               1.07 biphasic         +---------+------------------+-----+---------+-------+ Ernst Spell               0.76 Normal           +---------+------------------+-----+---------+-------+ +-------+-----------+-----------+------------+------------+ ABI/TBIToday's ABIToday's TBIPrevious ABIPrevious TBI +-------+-----------+-----------+------------+------------+ Right  1.22       0.63                                +-------+-----------+-----------+------------+------------+ Left   1.12       0.76                                +-------+-----------+-----------+------------+------------+  Summary: Right: Resting right ankle-brachial index is within normal range. The right toe-brachial index is normal. Although ankle brachial indices are within normal limits (0.95-1.29), arterial Doppler waveforms at the ankle suggest some component of arterial occlusive disease. Left: Resting left ankle-brachial index is within normal range. The left toe-brachial index is normal. *See table(s) above for measurements and observations.  Electronically signed by Sherald Hess MD on 06/12/2023 at 9:26:57 AM.    Final    VAS [place] LOWER EXTREMITY VENOUS (DVT)  Result Date: 06/10/2023  Lower Venous DVT Study Patient Name:  [name]  Date of Exam:   06/10/2023 Medical Rec #: [id number]       Accession #:     [id number] Date of Birth: 1933/09/27       Patient Gender: F Patient Age:   49 years Exam Location:  [facility] Procedure:      VAS [place] LOWER EXTREMITY VENOUS (DVT) Referring Phys: DAN FLOYD --------------------------------------------------------------------------------  Indications: Pain.  Risk Factors: None identified. Limitations: Body habitus, poor ultrasound/tissue interface and patient pain tolerance. Comparison Study: No prior studies. Performing Technologist: Chanda Busing RVT  Examination Guidelines: A complete evaluation includes B-mode imaging, spectral Doppler, color Doppler, and power

## 2023-06-20 NOTE — Plan of Care (Signed)
  Problem: Education: Goal: Knowledge of General Education information will improve Description Including pain rating scale, medication(s)/side effects and non-pharmacologic comfort measures Outcome: Progressing   Problem: Health Behavior/Discharge Planning: Goal: Ability to manage health-related needs will improve Outcome: Progressing   

## 2023-06-20 NOTE — TOC Transition Note (Signed)
Transition of Care [facility]) - CM/SW Discharge Note   Patient Details  Name: [name] MRN: [id number] Date of Birth: 1933-10-15  [facility] CM/SW Contact:  Erin Sons, LCSW Phone Number: 06/20/2023, 12:35 PM   Clinical Narrative:     Per MD patient ready for DC to [facility]. RN, patient, patient's family, and facility notified of DC. Discharge Summary and FL2 sent to facility. RN to call report prior to discharge [phone] Room 304-2). DC packet on chart. Ambulance transport requested for patient.   CSW will sign off for now as social work intervention is no longer needed. Please consult [place] again if new needs arise.   Final next level of care: Skilled Nursing Facility Barriers to Discharge: No Barriers Identified   Patient Goals and CMS Choice CMS Medicare.gov Compare Post Acute Care list provided to:: Patient Represenative (must comment) Choice offered to / list presented to : Adult Children  Discharge Placement                Patient chooses bed at:  [facility]) Patient to be transferred to facility by: Daughter Name of family member notified: Tobi Bastos Patient and family notified of of transfer: 06/20/23  Discharge Plan and Services Additional resources added to the After Visit Summary for                                       Social Determinants of Health (SDOH) Interventions SDOH Screenings   Food Insecurity: No Food Insecurity (06/10/2023)  Housing: Low Risk  (06/10/2023)  Transportation Needs: No Transportation Needs (06/10/2023)  Utilities: Not At Risk (06/10/2023)  Depression (PHQ2-9): Low Risk  (05/03/2023)  Financial Resource Strain: Low Risk  (03/07/2023)  Physical Activity: Unknown (01/27/2023)  Social Connections: Unknown (01/27/2023)  Stress: No Stress Concern Present (01/27/2023)  Tobacco Use: Low Risk  (06/14/2023)     Readmission Risk Interventions    06/12/2023   10:22 AM 03/25/2023    3:24 PM 08/21/2022   12:03 PM   Readmission Risk Prevention Plan  Post Dischage Appt  Complete   Medication Screening  Complete   Transportation Screening Complete Complete Complete  PCP or Specialist Appt within 5-7 Days Complete    Home Care Screening Complete  Complete  Medication Review (RN CM) Referral to Pharmacy  Complete

## 2023-06-21 ENCOUNTER — Emergency Department (HOSPITAL_COMMUNITY): Payer: Medicare HMO

## 2023-06-21 ENCOUNTER — Emergency Department (HOSPITAL_COMMUNITY)
Admission: EM | Admit: 2023-06-21 | Discharge: 2023-06-27 | Disposition: A | Discharge status: Home / Self Care | Payer: Medicare HMO | Attending: Emergency Medicine | Admitting: Emergency Medicine

## 2023-06-21 ENCOUNTER — Encounter (HOSPITAL_COMMUNITY): Payer: Self-pay

## 2023-06-21 ENCOUNTER — Other Ambulatory Visit: Payer: Self-pay

## 2023-06-21 DIAGNOSIS — I5033 Acute on chronic diastolic (congestive) heart failure: Secondary | ICD-10-CM | POA: Insufficient documentation

## 2023-06-21 DIAGNOSIS — R2689 Other abnormalities of gait and mobility: Secondary | ICD-10-CM | POA: Insufficient documentation

## 2023-06-21 DIAGNOSIS — M6281 Muscle weakness (generalized): Secondary | ICD-10-CM | POA: Diagnosis not present

## 2023-06-21 DIAGNOSIS — R531 Weakness: Secondary | ICD-10-CM | POA: Diagnosis not present

## 2023-06-21 DIAGNOSIS — Z79899 Other long term (current) drug therapy: Secondary | ICD-10-CM | POA: Insufficient documentation

## 2023-06-21 DIAGNOSIS — I6782 Cerebral ischemia: Secondary | ICD-10-CM | POA: Diagnosis not present

## 2023-06-21 DIAGNOSIS — Z1152 Encounter for screening for COVID-19: Secondary | ICD-10-CM | POA: Insufficient documentation

## 2023-06-21 DIAGNOSIS — R4182 Altered mental status, unspecified: Secondary | ICD-10-CM | POA: Diagnosis not present

## 2023-06-21 DIAGNOSIS — I130 Hypertensive heart and chronic kidney disease with heart failure and stage 1 through stage 4 chronic kidney disease, or unspecified chronic kidney disease: Secondary | ICD-10-CM | POA: Diagnosis not present

## 2023-06-21 DIAGNOSIS — Z20822 Contact with and (suspected) exposure to covid-19: Secondary | ICD-10-CM | POA: Insufficient documentation

## 2023-06-21 DIAGNOSIS — Z789 Other specified health status: Secondary | ICD-10-CM

## 2023-06-21 DIAGNOSIS — I2510 Atherosclerotic heart disease of native coronary artery without angina pectoris: Secondary | ICD-10-CM | POA: Insufficient documentation

## 2023-06-21 DIAGNOSIS — R918 Other nonspecific abnormal finding of lung field: Secondary | ICD-10-CM | POA: Diagnosis not present

## 2023-06-21 DIAGNOSIS — Z7982 Long term (current) use of aspirin: Secondary | ICD-10-CM | POA: Diagnosis not present

## 2023-06-21 DIAGNOSIS — N1832 Chronic kidney disease, stage 3b: Secondary | ICD-10-CM | POA: Diagnosis not present

## 2023-06-21 DIAGNOSIS — Z7389 Other problems related to life management difficulty: Secondary | ICD-10-CM | POA: Diagnosis not present

## 2023-06-21 DIAGNOSIS — R2681 Unsteadiness on feet: Secondary | ICD-10-CM | POA: Diagnosis not present

## 2023-06-21 DIAGNOSIS — R410 Disorientation, unspecified: Secondary | ICD-10-CM | POA: Diagnosis not present

## 2023-06-21 DIAGNOSIS — K449 Diaphragmatic hernia without obstruction or gangrene: Secondary | ICD-10-CM | POA: Diagnosis not present

## 2023-06-21 LAB — COMPREHENSIVE METABOLIC PANEL
ALT: 17 U/L (ref 0–44)
AST: 34 U/L (ref 15–41)
Albumin: 2 g/dL — ABNORMAL LOW (ref 3.5–5.0)
Alkaline Phosphatase: 60 U/L (ref 38–126)
Anion gap: 10 (ref 5–15)
BUN: 29 mg/dL — ABNORMAL HIGH (ref 8–23)
CO2: 25 mmol/L (ref 22–32)
Calcium: 8.6 mg/dL — ABNORMAL LOW (ref 8.9–10.3)
Chloride: 98 mmol/L (ref 98–111)
Creatinine, Ser: 2.07 mg/dL — ABNORMAL HIGH (ref 0.44–1.00)
GFR, Estimated: 23 mL/min — ABNORMAL LOW (ref >60)
Glucose, Bld: 88 mg/dL (ref 70–99)
Potassium: 4.2 mmol/L (ref 3.5–5.1)
Sodium: 133 mmol/L — ABNORMAL LOW (ref 135–145)
Total Bilirubin: 0.5 mg/dL (ref 0.3–1.2)
Total Protein: 6.1 g/dL — ABNORMAL LOW (ref 6.5–8.1)

## 2023-06-21 LAB — URINALYSIS, ROUTINE W REFLEX MICROSCOPIC
Bilirubin Urine: NEGATIVE
Glucose, UA: NEGATIVE mg/dL
Ketones, ur: NEGATIVE mg/dL
Nitrite: NEGATIVE
Protein, ur: NEGATIVE mg/dL
Specific Gravity, Urine: 1.009 (ref 1.005–1.030)
pH: 5 (ref 5.0–8.0)

## 2023-06-21 LAB — TROPONIN I (HIGH SENSITIVITY)
Troponin I (High Sensitivity): 20 ng/L — ABNORMAL HIGH (ref <18)
Troponin I (High Sensitivity): 20 ng/L — ABNORMAL HIGH (ref <18)

## 2023-06-21 LAB — CBC WITH DIFFERENTIAL/PLATELET
Abs Immature Granulocytes: 0.03 10*3/uL (ref 0.00–0.07)
Basophils Absolute: 0 10*3/uL (ref 0.0–0.1)
Basophils Relative: 1 %
Eosinophils Absolute: 0.2 10*3/uL (ref 0.0–0.5)
Eosinophils Relative: 3 %
HCT: 34.8 % — ABNORMAL LOW (ref 36.0–46.0)
Hemoglobin: 11.3 g/dL — ABNORMAL LOW (ref 12.0–15.0)
Immature Granulocytes: 0 %
Lymphocytes Relative: 17 %
Lymphs Abs: 1.5 10*3/uL (ref 0.7–4.0)
MCH: 29 pg (ref 26.0–34.0)
MCHC: 32.5 g/dL (ref 30.0–36.0)
MCV: 89.2 fL (ref 80.0–100.0)
Monocytes Absolute: 0.9 10*3/uL (ref 0.1–1.0)
Monocytes Relative: 10 %
Neutro Abs: 6 10*3/uL (ref 1.7–7.7)
Neutrophils Relative %: 69 %
Platelets: 311 10*3/uL (ref 150–400)
RBC: 3.9 MIL/uL (ref 3.87–5.11)
RDW: 15.7 % — ABNORMAL HIGH (ref 11.5–15.5)
WBC: 8.6 10*3/uL (ref 4.0–10.5)
nRBC: 0 % (ref 0.0–0.2)

## 2023-06-21 LAB — BLOOD GAS, VENOUS
Acid-Base Excess: 6.4 mmol/L — ABNORMAL HIGH (ref 0.0–2.0)
Bicarbonate: 30.5 mmol/L — ABNORMAL HIGH (ref 20.0–28.0)
Drawn by: 27160
O2 Saturation: 81.8 %
Patient temperature: 37.6
pCO2, Ven: 42 mm[Hg] — ABNORMAL LOW (ref 44–60)
pH, Ven: 7.47 — ABNORMAL HIGH (ref 7.25–7.43)
pO2, Ven: 49 mm[Hg] — ABNORMAL HIGH (ref 32–45)

## 2023-06-21 LAB — RESP PANEL BY RT-PCR (RSV, FLU A&B, COVID)  RVPGX2
Influenza A by PCR: NEGATIVE
Influenza B by PCR: NEGATIVE
Resp Syncytial Virus by PCR: NEGATIVE
SARS Coronavirus 2 by RT PCR: NEGATIVE

## 2023-06-21 LAB — BRAIN NATRIURETIC PEPTIDE: B Natriuretic Peptide: 43 pg/mL (ref 0.0–100.0)

## 2023-06-21 LAB — AMMONIA: Ammonia: <10 umol/L (ref 9–35)

## 2023-06-21 MED ORDER — HYDRALAZINE HCL 25 MG PO TABS
25.0000 mg | ORAL_TABLET | Freq: Two times a day (BID) | ORAL | Status: DC
  Administered 2023-06-21 – 2023-06-27 (×11): 25 mg via ORAL
  Filled 2023-06-21 (×11): qty 1

## 2023-06-21 MED ORDER — ACETAMINOPHEN 500 MG PO TABS
1000.0000 mg | ORAL_TABLET | Freq: Four times a day (QID) | ORAL | Status: DC | PRN
  Administered 2023-06-22 – 2023-06-26 (×4): 1000 mg via ORAL
  Filled 2023-06-21 (×5): qty 2

## 2023-06-21 MED ORDER — RIVAROXABAN 15 MG PO TABS
15.0000 mg | ORAL_TABLET | Freq: Every day | ORAL | Status: DC
  Administered 2023-06-22 – 2023-06-26 (×4): 15 mg via ORAL
  Filled 2023-06-21 (×4): qty 1

## 2023-06-21 MED ORDER — PANTOPRAZOLE SODIUM 40 MG PO TBEC
40.0000 mg | DELAYED_RELEASE_TABLET | Freq: Every day | ORAL | Status: DC
  Administered 2023-06-21 – 2023-06-27 (×7): 40 mg via ORAL
  Filled 2023-06-21 (×7): qty 1

## 2023-06-21 MED ORDER — METOPROLOL SUCCINATE ER 25 MG PO TB24
12.5000 mg | ORAL_TABLET | Freq: Every day | ORAL | Status: DC
  Administered 2023-06-21 – 2023-06-27 (×7): 12.5 mg via ORAL
  Filled 2023-06-21 (×7): qty 1

## 2023-06-21 NOTE — ED Triage Notes (Signed)
Pt bib [facility] from [facility] rehab center. Pt family states she has altered mental status. Pt was discharged from this hospital yesterday with a UTI. Family states they feel the UTI isn't getting any better. Pt was able to answer all questions. States her back hurts but that is normal for her.

## 2023-06-21 NOTE — ED Notes (Signed)
[facility] called for update and CN told them pt is a TOC hold due to pt refuses to return to facility. CN spoke to [facility].

## 2023-06-21 NOTE — ED Notes (Signed)
Patient care taken, son sitting at bedside, no complaints at this time.

## 2023-06-21 NOTE — ED Provider Notes (Signed)
[place] EMERGENCY DEPARTMENT AT [facility] Provider Note  CSN: [id number] Arrival date & time: 06/21/23 3474  Chief Complaint(s) Altered Mental Status  HPI [name] is a 89 y.o. female with past medical history as below, significant for CKD, CVA, hypertension, HLD, arthritis who presents to the ED with complaint of AMS  Of note she was also admitted 9/30 secondary to presumed CHF exacerbation and discharged 10/2.  She reported back to the ER 10/4 following discharge due to family having difficulty taking care of her, progressive weakness/deconditioning  She was admitted 06/14/2023 with metabolic encephalopathy likely secondary to UTI, also noted deconditioning.  Discharged 06/19/2023.  Mental status improved at that time after treatment of ESBL E. coli UTI.  Was discharged to [facility] rehab.  Son at bedside reports that mother called him around 1 AM this morning and was confused, did not know where she was.  Facility reports that patient was trying to get out of bed and nearly fell to the ground but she was caught by aid and did not fall to the ground.   Productive cough with clear phlegm over the past 24 to 48 hours.    Symptoms today very similar to presentation when she was recently admitted for UTI, metabolic encephalopathy  Past Medical History Past Medical History:  Diagnosis Date   Arthritis    CKD (chronic kidney disease), stage III (HCC)    Complete heart block (HCC) 11/16/2016   a. transient during NSTEMI, resolved with revascularization.   Coronary artery disease 11/2009   a. with prior stent placement to the ramus intermedius and RCA. b. NSTEMI 11/2016 s/p DES to DES to distal Cx with moderate residual dz.   CVA (cerebral vascular accident) [facility])    Essential hypertension    Mixed hyperlipidemia    Non-ST elevation (NSTEMI) myocardial infarction (HCC) 11/16/2016   Obesity    Osteoarthritis    Reflux esophagitis    Sleep apnea 09/27/2010   Untreated, REM  64.7/hr AHI 18.5/hr RDI 19.2/hr. Patuent refused CPAP therapy   Patient Active Problem List   Diagnosis Date Noted   AKI (acute kidney injury) (HCC) 06/20/2023   Weakness 06/20/2023   Acute metabolic encephalopathy 06/15/2023   Pressure injury of skin 06/15/2023   UTI (urinary tract infection) 06/15/2023   Fever 06/11/2023   Edema of both legs 06/10/2023   Acute on chronic heart failure with preserved ejection fraction (HCC) 06/10/2023   Cervical stenosis of spine 05/08/2023   Paroxysmal atrial fibrillation (HCC) 04/04/2023   Hypercoagulable state due to paroxysmal atrial fibrillation (HCC) 04/04/2023   Numbness and tingling of both feet 03/23/2023   Hypoalbuminemia due to protein-calorie malnutrition (HCC) 03/23/2023   Bilateral primary osteoarthritis of knee 11/01/2022   Encounter for general adult medical examination with abnormal findings 11/01/2022   Chronic kidney disease, stage 3b (HCC) 08/21/2022   Left pontine stroke (HCC) 11/06/2021   Dyslipidemia 11/06/2021   GERD without esophagitis 11/06/2021   Benign hypertensive kidney disease with chronic kidney disease 07/31/2019   Proteinuria 07/31/2019   Secondary hyperparathyroidism of renal origin (HCC) 07/31/2019   CKD stage 3b, GFR 30-44 ml/min (HCC) 07/31/2019   CKD (chronic kidney disease), stage III (HCC) 11/23/2016   Pericardial effusion 11/23/2016   Complete heart block (HCC) 11/16/2016   Non-ST elevation (NSTEMI) myocardial infarction (HCC) 11/16/2016   Renal insufficiency 11/03/2015   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 09/06/2015   Uncontrolled hypertension 08/31/2015   HTN (hypertension), malignant  Symptomatic bradycardia    Mixed hyperlipidemia    Epigastric fullness    Near syncope 08/28/2015   CAD S/P PCI    Essential hypertension 03/05/2013   Obesity (BMI 30-39.9) 03/05/2013   Hyperlipidemia with target LDL less than 70 03/05/2013   Obstructive sleep apnea 03/05/2013    Home Medication(s) Prior to Admission medications   Medication Sig Start Date End Date Taking? Authorizing Provider  acetaminophen (TYLENOL) 500 MG tablet Take 2 tablets (1,000 mg total) by mouth every 6 (six) hours as needed for mild pain or moderate pain. 03/25/23   Rolly Salter, MD  aspirin EC 81 MG tablet Take 1 tablet (81 mg total) by mouth daily. Swallow whole. 04/05/23   Fenton, Clint R, PA  ergocalciferol (VITAMIN D2) 1.25 MG (50000 UT) capsule Take 50,000 Units by mouth every 30 (thirty) days. 04/12/23   [provider]  hydrALAZINE (APRESOLINE) 25 MG tablet Take 1 tablet (25 mg total) by mouth in the morning and at bedtime. 11/21/22 11/16/23  Jonelle Sidle, MD  Menthol, Topical Analgesic, (BIOFREEZE ROLL-ON) 4 % GEL Apply 1 application. topically daily as needed (pain).    [provider]  Menthol, Topical Analgesic, (BIOFREEZE) 10 % CREA Apply 1 application  topically in the morning and at bedtime.    [provider]  metoprolol succinate (TOPROL XL) 25 MG 24 hr tablet Take 0.5 tablets (12.5 mg total) by mouth daily. 02/11/23   Sharlene Dory, NP  nitroGLYCERIN (NITROSTAT) 0.4 MG SL tablet PLACE 1 TABLET UNDER THE TONGUE EVERY 5 MINUTES AS NEEDED. 11/21/22   Jonelle Sidle, MD  pantoprazole (PROTONIX) 40 MG tablet TAKE 1 TABLET BY MOUTH EVERY DAY 03/22/23   Jonelle Sidle, MD  potassium chloride (KLOR-CON) 20 MEQ packet Take 20 mEq by mouth daily. 06/13/23   Philomena Doheny, MD  Rivaroxaban (XARELTO) 15 MG TABS tablet Take 1 tablet (15 mg total) by mouth daily with supper. 04/12/23   Jonelle Sidle, MD  rosuvastatin (CRESTOR) 40 MG tablet TAKE 1 TABLET BY MOUTH EVERY DAY 05/31/23   Jonelle Sidle, MD  torsemide (DEMADEX) 20 MG tablet Take 1 tablet (20 mg total) by mouth daily. 06/13/23   Philomena Doheny, MD                                                                                                                                     Past Surgical History Past Surgical History:  Procedure Laterality Date   CHOLECYSTECTOMY     CORONARY ANGIOPLASTY WITH STENT PLACEMENT  2007   A 3.0x2mm CYPHER stent post dilated to 3.29 mm the 100% occlusion was reduced to 0%   CORONARY STENT INTERVENTION N/A 11/16/2016   Procedure: Coronary Stent Intervention;  Surgeon: Tonny Bollman, MD;  Location: [facility] INVASIVE CV LAB;  Service: Cardiovascular;  Laterality: N/A;   LEFT HEART CATH AND CORONARY ANGIOGRAPHY N/A 11/16/2016  Procedure: Left Heart Cath and Coronary Angiography;  Surgeon: Tonny Bollman, MD;  Location: [facility] INVASIVE CV LAB;  Service: Cardiovascular;  Laterality: N/A;   LEFT HEART CATHETERIZATION WITH CORONARY ANGIOGRAM N/A 07/12/2014   Procedure: LEFT HEART CATHETERIZATION WITH CORONARY ANGIOGRAM;  Surgeon: Micheline Chapman, MD;  Location[facility] CATH LAB;  Service: Cardiovascular;  Laterality: N/A;   TEMPORARY PACEMAKER N/A 11/16/2016   Procedure: Temporary Pacemaker;  Surgeon: Tonny Bollman, MD;  Location: [facility] INVASIVE CV LAB;  Service: Cardiovascular;  Laterality: N/A;   Family History Family History  Problem Relation Age of Onset   Hypertension Mother    Aneurysm Mother        died at age 66   Other Father        unsure of medical history   Hypertension Other     Social History Social History   Tobacco Use   Smoking status: Never   Smokeless tobacco: Never  Vaping Use   Vaping status: Never Used  Substance Use Topics   Alcohol use: No   Drug use: No   Allergies Atorvastatin, Contrast media [iodinated contrast media], Darvon [propoxyphene], and Codeine  Review of Systems Review of Systems  Unable to perform ROS: Mental status change  Constitutional:  Positive for fatigue. Negative for chills and fever.  Respiratory:  Positive for cough. Negative for shortness of breath.   Cardiovascular:  Negative for chest pain.  Gastrointestinal:  Negative for abdominal pain, nausea and vomiting.  Musculoskeletal:   Positive for arthralgias.  Skin:  Negative for wound.  Psychiatric/Behavioral:  Positive for confusion.     Physical Exam Vital Signs  I have reviewed the triage vital signs BP 114/75   Pulse 71   Temp 98.1 F (36.7 C)   Resp 20   Ht 5\' 5"  (1.651 m)   Wt 90.5 kg   SpO2 99%   BMI 33.20 kg/m  Physical Exam Vitals and nursing note reviewed.  Constitutional:      General: She is not in acute distress.    Appearance: Normal appearance. She is obese.  HENT:     Head: Normocephalic and atraumatic.     Right Ear: External ear normal.     Left Ear: External ear normal.     Nose: Nose normal.     Mouth/Throat:     Mouth: Mucous membranes are moist.  Eyes:     General: No scleral icterus.       Right eye: No discharge.        Left eye: No discharge.  Cardiovascular:     Rate and Rhythm: Normal rate and regular rhythm.     Pulses: Normal pulses.     Heart sounds: Normal heart sounds.  Pulmonary:     Effort: Pulmonary effort is normal. No respiratory distress.     Breath sounds: Normal breath sounds. No stridor.  Abdominal:     General: Abdomen is flat. There is no distension.     Palpations: Abdomen is soft.     Tenderness: There is no abdominal tenderness.  Musculoskeletal:     Cervical back: No rigidity.     Right lower leg: No edema.     Left lower leg: No edema.  Skin:    General: Skin is warm and dry.     Capillary Refill: Capillary refill takes less than 2 seconds.  Neurological:     Mental Status: She is alert. She is confused.     GCS: GCS eye subscore is 4. GCS  verbal subscore is 4. GCS motor subscore is 6.     Cranial Nerves: Cranial nerves 2-12 are intact.     Sensory: Sensation is intact.     Motor: Motor function is intact.     Coordination: Coordination is intact.     Comments: Gait testing deferred secondary to patient safety.  Strength 5/5 blue blle  Psychiatric:        Mood and Affect: Mood normal.        Behavior: Behavior normal. Behavior is  cooperative.     ED Results and Treatments Labs (all labs ordered are listed, but only abnormal results are displayed) Labs Reviewed  CBC WITH DIFFERENTIAL/PLATELET - Abnormal; Notable for the following components:      Result Value   Hemoglobin 11.3 (*)    HCT 34.8 (*)    RDW 15.7 (*)    All other components within normal limits  COMPREHENSIVE METABOLIC PANEL - Abnormal; Notable for the following components:   Sodium 133 (*)    BUN 29 (*)    Creatinine, Ser 2.07 (*)    Calcium 8.6 (*)    Total Protein 6.1 (*)    Albumin 2.0 (*)    GFR, Estimated 23 (*)    All other components within normal limits  URINALYSIS, ROUTINE W REFLEX MICROSCOPIC - Abnormal; Notable for the following components:   APPearance HAZY (*)    Hgb urine dipstick SMALL (*)    Leukocytes,Ua TRACE (*)    Bacteria, UA RARE (*)    All other components within normal limits  BLOOD GAS, VENOUS - Abnormal; Notable for the following components:   pH, Ven 7.47 (*)    pCO2, Ven 42 (*)    pO2, Ven 49 (*)    Bicarbonate 30.5 (*)    Acid-Base Excess 6.4 (*)    All other components within normal limits  TROPONIN I (HIGH SENSITIVITY) - Abnormal; Notable for the following components:   Troponin I (High Sensitivity) 20 (*)    All other components within normal limits  TROPONIN I (HIGH SENSITIVITY) - Abnormal; Notable for the following components:   Troponin I (High Sensitivity) 20 (*)    All other components within normal limits  RESP PANEL BY RT-PCR (RSV, FLU A&B, COVID)  RVPGX2  SARS CORONAVIRUS 2 BY RT PCR  URINE CULTURE  AMMONIA  BRAIN NATRIURETIC PEPTIDE                                                                                                                          Radiology CT Head Wo Contrast  Result Date: 06/21/2023 CLINICAL DATA:  Delirium EXAM: CT HEAD WITHOUT CONTRAST TECHNIQUE: Contiguous axial images were obtained from the base of the skull through the vertex without intravenous contrast.  RADIATION DOSE REDUCTION: This exam was performed according to the departmental dose-optimization program which includes automated exposure control, adjustment of the mA and/or kV according to patient size and/or use of iterative reconstruction technique. COMPARISON:  03/23/2023 FINDINGS: Brain: No evidence of acute infarction, hemorrhage, hydrocephalus, extra-axial collection or mass lesion/mass effect. Patchy low-density changes within the periventricular and subcortical white matter most compatible with chronic microvascular ischemic change. Mild diffuse cerebral volume loss. Vascular: Atherosclerotic calcifications involving the large vessels of the skull base. No unexpected hyperdense vessel. Skull: Normal. Negative for fracture or focal lesion. Sinuses/Orbits: No acute finding. Other: None. IMPRESSION: 1. No acute intracranial findings. 2. Chronic microvascular ischemic change and cerebral volume loss. Electronically Signed   By: Duanne Guess D.O.   On: 06/21/2023 14:29   DG Chest Portable 1 View  Result Date: 06/21/2023 CLINICAL DATA:  screening.  Altered mental status. EXAM: PORTABLE CHEST 1 VIEW COMPARISON:  06/14/2023. FINDINGS: Redemonstration of left retrocardiac airspace opacity obscuring the left hemidiaphragm, descending thoracic aorta and blunting the left lateral costophrenic angle suggesting combination of left lower lobe atelectasis and/or consolidation with probable pleural effusion. Bilateral lung fields are otherwise clear. Right lateral costophrenic angle is clear. Stable cardio-mediastinal silhouette. No acute osseous abnormalities. Redemonstration of small-to-moderate retrocardiac hiatal hernia. The soft tissues are within normal limits. IMPRESSION: 1. Left lower lobe atelectasis and/or consolidation with probable associated pleural effusion. 2. Small-to-moderate retrocardiac hiatal hernia. Electronically Signed   By: Jules Schick M.D.   On: 06/21/2023 11:23    Pertinent labs &  imaging results that were available during my care of the patient were reviewed by me and considered in my medical decision making (see MDM for details).  Medications Ordered in ED Medications - No data to display                                                                                                                                   Procedures Procedures  (including critical care time)  Medical Decision Making / ED Course    Medical Decision Making:    Taquilla Judie Petit Solberg is a 89 y.o. female with past medical history as below, significant for CKD, CVA, hypertension, HLD, arthritis who presents to the ED with complaint of AMS. The complaint involves an extensive differential diagnosis and also carries with it a high risk of complications and morbidity.  Serious etiology was considered. Ddx includes but is not limited to: Differential diagnoses for altered mental status includes but is not exclusive to alcohol, illicit or prescription medications, intracranial pathology such as stroke, intracerebral hemorrhage, fever or infectious causes including sepsis, hypoxemia, uremia, trauma, endocrine related disorders such as diabetes, hypoglycemia, thyroid-related diseases, etc.   Complete initial physical exam performed, notably the patient  was no acute distress, low-grade temperature noted..    Reviewed and confirmed nursing documentation for past medical history, family history, social history.  Vital signs reviewed.    Clinical Course as of 06/21/23 2053  Fri Jun 21, 2023  1205 Troponin I (High Sensitivity)(!): 20 Similar to recent values [SG]  1206 Creatinine(!): 2.07 Similar to prior at discharge [SG]  1206 B Natriuretic  Peptide: 43.0 Chest x-ray with consolidation, cough with occ clear sputum, no fevers. Will send viral swab. [SG]  1501 Troponin I (High Sensitivity)(!): 20 Trop flat [SG]    Clinical Course User Index [SG] Sloan Leiter, DO     Neuroexam is nonfocal, mental  status back to baseline.  Confusion favored secondary to delirium given she was recently placed into a SNF and time of delirium was approx 1:30 in the morning.  Back to baseline this time.  Upon recheck she is feeling better.  Patient is concerned in regarding her rehab facility, she does not like the facility which seems to be her primary concern today  Her workup today looks okay, nothing that would require re-admission. She is not willing to go back to her SNF. Will consult TOC to see if we can get her to a different SNF, d/w pt and family this might not be possible and they may have to wait multiple days for this to happen if we were able to do this. Pt adamant she cannot go back to SNF despite this.               Additional history obtained: -Additional history obtained from family -External records from outside source obtained and reviewed including: Chart review including previous notes, labs, imaging, consultation notes including  Recent admission, recent medications, recent cultures   Lab Tests: -I ordered, reviewed, and interpreted labs.   The pertinent results include:   Labs Reviewed  CBC WITH DIFFERENTIAL/PLATELET - Abnormal; Notable for the following components:      Result Value   Hemoglobin 11.3 (*)    HCT 34.8 (*)    RDW 15.7 (*)    All other components within normal limits  COMPREHENSIVE METABOLIC PANEL - Abnormal; Notable for the following components:   Sodium 133 (*)    BUN 29 (*)    Creatinine, Ser 2.07 (*)    Calcium 8.6 (*)    Total Protein 6.1 (*)    Albumin 2.0 (*)    GFR, Estimated 23 (*)    All other components within normal limits  URINALYSIS, ROUTINE W REFLEX MICROSCOPIC - Abnormal; Notable for the following components:   APPearance HAZY (*)    Hgb urine dipstick SMALL (*)    Leukocytes,Ua TRACE (*)    Bacteria, UA RARE (*)    All other components within normal limits  BLOOD GAS, VENOUS - Abnormal; Notable for the following components:    pH, Ven 7.47 (*)    pCO2, Ven 42 (*)    pO2, Ven 49 (*)    Bicarbonate 30.5 (*)    Acid-Base Excess 6.4 (*)    All other components within normal limits  TROPONIN I (HIGH SENSITIVITY) - Abnormal; Notable for the following components:   Troponin I (High Sensitivity) 20 (*)    All other components within normal limits  TROPONIN I (HIGH SENSITIVITY) - Abnormal; Notable for the following components:   Troponin I (High Sensitivity) 20 (*)    All other components within normal limits  RESP PANEL BY RT-PCR (RSV, FLU A&B, COVID)  RVPGX2  SARS CORONAVIRUS 2 BY RT PCR  URINE CULTURE  AMMONIA  BRAIN NATRIURETIC PEPTIDE    Notable for labs stable  EKG   EKG Interpretation Date/Time:    Ventricular Rate:    PR Interval:    QRS Duration:    QT Interval:    QTC Calculation:   R Axis:      Text Interpretation:  Imaging Studies ordered: I ordered imaging studies including cxr cth I independently visualized the following imaging with scope of interpretation limited to determining acute life threatening conditions related to emergency care; findings noted above I independently visualized and interpreted imaging. I agree with the radiologist interpretation   Medicines ordered and prescription drug management: No orders of the defined types were placed in this encounter.   -I have reviewed the patients home medicines and have made adjustments as needed   Consultations Obtained: I requested consultation with the toc   Cardiac Monitoring: Continuous pulse oximetry interpreted by myself, 99% on RA.    Social Determinants of Health:  Diagnosis or treatment significantly limited by social determinants of health: obesity   Reevaluation: After the interventions noted above, I reevaluated the patient and found that they have improved  Co morbidities that complicate the patient evaluation  Past Medical History:  Diagnosis Date   Arthritis    CKD (chronic kidney  disease), stage III (HCC)    Complete heart block (HCC) 11/16/2016   a. transient during NSTEMI, resolved with revascularization.   Coronary artery disease 11/2009   a. with prior stent placement to the ramus intermedius and RCA. b. NSTEMI 11/2016 s/p DES to DES to distal Cx with moderate residual dz.   CVA (cerebral vascular accident) [facility])    Essential hypertension    Mixed hyperlipidemia    Non-ST elevation (NSTEMI) myocardial infarction (HCC) 11/16/2016   Obesity    Osteoarthritis    Reflux esophagitis    Sleep apnea 09/27/2010   Untreated, REM 64.7/hr AHI 18.5/hr RDI 19.2/hr. Patuent refused CPAP therapy      Dispostion: Disposition decision including need for hospitalization was considered, and patient disposition pending at time of sign out.    Final Clinical Impression(s) / ED Diagnoses Final diagnoses:  Difficulty with activities of daily living        Sloan Leiter, DO 06/21/23 2053

## 2023-06-21 NOTE — ED Notes (Signed)
Christina from [facility] called to inquire on pt.

## 2023-06-22 DIAGNOSIS — R4182 Altered mental status, unspecified: Secondary | ICD-10-CM | POA: Diagnosis not present

## 2023-06-22 NOTE — ED Provider Notes (Signed)
Emergency Medicine Observation Re-evaluation Note  [name] is a 89 y.o. female, seen on rounds today.  Pt initially presented to the ED for complaints of Altered Mental Status Currently, the patient is resting comfortably.  Physical Exam  BP 132/66   Pulse 66   Temp 98.1 F (36.7 C)   Resp (!) 27   Ht 5\' 5"  (1.651 m)   Wt 90.5 kg   SpO2 100%   BMI 33.20 kg/m  Physical Exam Vitals and nursing note reviewed.  Constitutional:      General: She is not in acute distress.    Appearance: She is well-developed.  HENT:     Head: Normocephalic and atraumatic.  Eyes:     Conjunctiva/sclera: Conjunctivae normal.  Cardiovascular:     Rate and Rhythm: Normal rate and regular rhythm.     Heart sounds: No murmur heard. Pulmonary:     Effort: Pulmonary effort is normal. No respiratory distress.     Breath sounds: Normal breath sounds.  Abdominal:     Palpations: Abdomen is soft.     Tenderness: There is no abdominal tenderness.  Musculoskeletal:        General: No swelling.     Cervical back: Neck supple.  Skin:    General: Skin is warm and dry.     Capillary Refill: Capillary refill takes less than 2 seconds.  Neurological:     Mental Status: She is alert.  Psychiatric:        Mood and Affect: Mood normal.      ED Course / MDM  EKG:EKG Interpretation Date/Time:  Friday June 21 2023 09:50:38 EDT Ventricular Rate:  90 PR Interval:  187 QRS Duration:  118 QT Interval:  419 QTC Calculation: 513 R Axis:   -58  Text Interpretation: Sinus rhythm Incomplete RBBB and LAFB similar to prior no stemi Confirmed by Tanda Rockers ([id number]) on 06/21/2023 9:18:49 PM  I have reviewed the labs performed to date as well as medications administered while in observation.  Recent changes in the last 24 hours include TOC evaluation as patient refuses to return to facility.  Plan  Current plan is for [facility] evaluation.    Glendora Score, MD 06/22/23 [phone]

## 2023-06-22 NOTE — ED Notes (Signed)
Pt was complaining of pain from pressure sore on bottom. Repositioned pt and placed pillow under left hip to help alleviate some pressure, and pulled pt up in the bed. Pt states she is now feeling relief on her bottom and is resting comfortably.

## 2023-06-22 NOTE — ED Notes (Signed)
Pt moved up in bed and repositioned

## 2023-06-22 NOTE — ED Notes (Addendum)
PT completed assessment.

## 2023-06-22 NOTE — ED Notes (Signed)
PT present.

## 2023-06-22 NOTE — Evaluation (Signed)
Physical Therapy Evaluation Patient Details Name: [name] MRN: [id number] DOB: 06-08-1934 Today's Date: 06/22/2023  History of Present Illness  Pt is a 89 yo female [facility] due to reported AMS at [facility]. Pt was recently admittd [facility] due to CHF exacerbation and BLE edema. Per pt, unwilling to return to previous rehab facility.  Clinical Impression   Pt is a 89yo female presenting to [facility] due to reason stated in the HPI.  Pt tolerated today's Physical Therapy Evaluation, showing similar functional level when previously hospitalization on 10/09. Pt showing ability to perform functional mobility but with moderate assistance and cuing. Generalized weakness and deconditioning are primarily reasons for functional impairments.. Based upon these deficits/impairments, pt would benefit from skilled acute physical therapy services to address the above deficits and improve their functional status. PT recommends pt discharge to short term skilled rehab facility in order to improve pt's functional status, safety and independence with functional mobility and overall QOL.             If plan is discharge home, recommend the following: A lot of help with walking and/or transfers;A lot of help with bathing/dressing/bathroom;Assistance with cooking/housework;Help with stairs or ramp for entrance   Can travel by private vehicle   No    Equipment Recommendations None recommended by PT  Recommendations for Other Services       Functional Status Assessment Patient has had a recent decline in their functional status and demonstrates the ability to make significant improvements in function in a reasonable and predictable amount of time.     Precautions / Restrictions Precautions Precautions: Fall Precaution Comments: Airborne/Contact Restrictions Weight Bearing Restrictions: No      Mobility  Bed Mobility Overal bed mobility: Needs  Assistance Bed Mobility: Supine to Sit Rolling: Min assist Sidelying to sit: Mod assist Supine to sit: Mod assist Sit to supine: Mod assist   General bed mobility comments: increased time, labored movement, pt needing increased cuing for proper movement patterns and reduce use of RUE.  supine to sit with cues for rolling with assist at trunk for elevation into sitting. Independent scooting to straight self in sitting EOB.    Transfers Overall transfer level: Needs assistance Equipment used: Rolling walker (2 wheels) Transfers: Sit to/from Stand Sit to Stand: Mod assist           General transfer comment: 3x sit/stands from ED bed. Slow movement with poor postural awareness when preparing for sit/stand. limited RUE assist on RW.    Ambulation/Gait Ambulation/Gait assistance: Mod assist Gait Distance (Feet): 3 Feet Assistive device: Rolling walker (2 wheels) Gait Pattern/deviations: Decreased step length - right, Decreased step length - left, Decreased stride length, Trunk flexed, Shuffle       General Gait Details: completed small labored sidesteps at EOB with tactile cues and cues at hips for psotrual awareness in preparation for bed positioning.  Stairs            Wheelchair Mobility     Tilt Bed    Modified Rankin (Stroke Patients Only)       Balance Overall balance assessment: Needs assistance Sitting-balance support: No upper extremity supported Sitting balance-Leahy Scale: Fair Sitting balance - Comments: fair/good seated at EOB Postural control: Left lateral lean Standing balance support: Reliant on assistive device for balance, During functional activity, Bilateral upper extremity supported Standing balance-Leahy Scale: Poor Standing balance comment: using RW  Pertinent Vitals/Pain Pain Assessment Pain Assessment: 0-10 Pain Score: 4  Pain Location: Right lower extremity Pain Descriptors / Indicators:  Aching Pain Intervention(s): Monitored during session    Home Living Family/patient expects to be discharged to:: Private residence Living Arrangements: Children Available Help at Discharge: Family;Available PRN/intermittently Type of Home: House Home Access: Stairs to enter Entrance Stairs-Rails: Right;Left;Can reach both Entrance Stairs-Number of Steps: 5-6   Home Layout: One level Home Equipment: Rollator (4 wheels);Cane - single point;Shower seat;Grab bars - tub/shower;Rolling Walker (2 wheels);BSC/3in1;Wheelchair - manual Additional Comments: Pt's family reported no change in previous living history.    Prior Function Prior Level of Function : Needs assist       Physical Assist : Mobility (physical);ADLs (physical) Mobility (physical): Transfers ADLs (physical): Feeding;Bathing;Dressing;IADLs Mobility Comments: uses RW or Rolator for mobility ADLs Comments: Dtr assist with all bathing, occasional assist with RUE dressing. Dtr drives pt to therapy     Extremity/Trunk Assessment   Upper Extremity Assessment Upper Extremity Assessment: Defer to OT evaluation    Lower Extremity Assessment Lower Extremity Assessment: Generalized weakness    Cervical / Trunk Assessment Cervical / Trunk Assessment: Kyphotic  Communication   Communication Communication: No apparent difficulties Cueing Techniques: Verbal cues;Tactile cues  Cognition Arousal: Alert Behavior During Therapy: WFL for tasks assessed/performed Overall Cognitive Status: Within Functional Limits for tasks assessed                   Orientation Level: Person, Place, Situation                      General Comments      Exercises     Assessment/Plan    PT Assessment Patient needs continued PT services  PT Problem List Decreased strength;Decreased activity tolerance;Decreased balance;Decreased mobility       PT Treatment Interventions DME instruction;Gait training;Stair  training;Functional mobility training;Therapeutic activities;Therapeutic exercise;Balance training;Patient/family education    PT Goals (Current goals can be found in the Care Plan section)  Acute Rehab PT Goals Patient Stated Goal: a new rehab facility PT Goal Formulation: With patient/family Time For Goal Achievement: 07/06/23 Potential to Achieve Goals: Good    Frequency Min 3X/week     Co-evaluation               AM-PAC PT "6 Clicks" Mobility  Outcome Measure Help needed turning from your back to your side while in a flat bed without using bedrails?: A Lot Help needed moving from lying on your back to sitting on the side of a flat bed without using bedrails?: A Lot Help needed moving to and from a bed to a chair (including a wheelchair)?: A Lot Help needed standing up from a chair using your arms (e.g., wheelchair or bedside chair)?: A Lot Help needed to walk in hospital room?: A Lot Help needed climbing 3-5 steps with a railing? : Total 6 Click Score: 11    End of Session Equipment Utilized During Treatment: Gait belt Activity Tolerance: Patient tolerated treatment well;Patient limited by fatigue Patient left: in bed;with call bell/phone within reach;with family/visitor present Nurse Communication: Mobility status PT Visit Diagnosis: Unsteadiness on feet (R26.81);Other abnormalities of gait and mobility (R26.89);Muscle weakness (generalized) (M62.81) Pain - Right/Left: Right Pain - part of body: Shoulder;Leg    Time: 2841-3244 PT Time Calculation (min) (ACUTE ONLY): 31 min   Charges:   PT Evaluation $PT Eval Low Complexity: 1 Low   PT General Charges $$ ACUTE PT VISIT: 1 Visit  Nelida Meuse PT, DPT Physical Therapist with [facility] [phone] office  Nelida Meuse 06/22/2023, 9:02 AM

## 2023-06-22 NOTE — Plan of Care (Signed)
  Problem: Acute Rehab PT Goals(only PT should resolve) Goal: Pt Will Go Supine/Side To Sit Flowsheets (Taken 06/22/2023 0906) Pt will go Supine/Side to Sit: with contact guard assist Goal: Patient Will Transfer Sit To/From Stand Flowsheets (Taken 06/22/2023 0906) Patient will transfer sit to/from stand: with contact guard assist Goal: Pt Will Ambulate Flowsheets (Taken 06/22/2023 0906) Pt will Ambulate:  25 feet  50 feet  with contact guard assist  with rolling walker  Nelida Meuse PT, DPT Physical Therapist with [facility] [phone] office

## 2023-06-22 NOTE — ED Notes (Signed)
Pt resting comfortably at this time. Daughter at bedside. Daughter gave pt bed bath. No needs at this time. Call light within reach, side rails up x2,  bed locked and in lowest position.

## 2023-06-23 DIAGNOSIS — R4182 Altered mental status, unspecified: Secondary | ICD-10-CM | POA: Diagnosis not present

## 2023-06-23 LAB — URINE CULTURE

## 2023-06-23 MED ORDER — ROSUVASTATIN CALCIUM 20 MG PO TABS
40.0000 mg | ORAL_TABLET | Freq: Every day | ORAL | Status: DC
  Administered 2023-06-23 – 2023-06-27 (×5): 40 mg via ORAL
  Filled 2023-06-23 (×5): qty 2

## 2023-06-23 MED ORDER — POLYETHYLENE GLYCOL 3350 17 G PO PACK
17.0000 g | PACK | Freq: Two times a day (BID) | ORAL | Status: DC
  Administered 2023-06-23 – 2023-06-26 (×5): 17 g via ORAL
  Filled 2023-06-23 (×5): qty 1

## 2023-06-23 MED ORDER — ASPIRIN 81 MG PO TBEC
81.0000 mg | DELAYED_RELEASE_TABLET | Freq: Every day | ORAL | Status: DC
  Administered 2023-06-23 – 2023-06-27 (×5): 81 mg via ORAL
  Filled 2023-06-23 (×5): qty 1

## 2023-06-23 MED ORDER — TORSEMIDE 20 MG PO TABS
20.0000 mg | ORAL_TABLET | Freq: Every day | ORAL | Status: DC
  Administered 2023-06-23 – 2023-06-27 (×5): 20 mg via ORAL
  Filled 2023-06-23 (×6): qty 1

## 2023-06-23 MED ORDER — POTASSIUM CHLORIDE CRYS ER 20 MEQ PO TBCR
20.0000 meq | EXTENDED_RELEASE_TABLET | Freq: Every day | ORAL | Status: DC
  Administered 2023-06-23 – 2023-06-24 (×2): 20 meq via ORAL
  Filled 2023-06-23 (×4): qty 1

## 2023-06-23 NOTE — ED Notes (Signed)
Pt turned to left side.

## 2023-06-23 NOTE — ED Notes (Signed)
Pt did not get breakfast tray this morning. Dietary notified and to bring tray to ED.

## 2023-06-23 NOTE — ED Provider Notes (Signed)
Emergency Medicine Observation Re-evaluation Note  [name] is a 89 y.o. female, seen on rounds today.  Pt initially presented to the ED for complaints of Altered Mental Status Currently, the patient is resting comfortably.  Physical Exam  BP (!) 130/57   Pulse 76   Temp 98 F (36.7 C) (Oral)   Resp 18   Ht 5\' 5"  (1.651 m)   Wt 90.5 kg   SpO2 95%   BMI 33.20 kg/m  Physical Exam Vitals and nursing note reviewed.  Constitutional:      General: She is not in acute distress.    Appearance: She is well-developed.  HENT:     Head: Normocephalic and atraumatic.  Eyes:     Conjunctiva/sclera: Conjunctivae normal.  Pulmonary:     Effort: Pulmonary effort is normal. No respiratory distress.  Musculoskeletal:        General: No swelling.     Cervical back: Neck supple.  Skin:    General: Skin is warm and dry.     Capillary Refill: Capillary refill takes less than 2 seconds.  Neurological:     Mental Status: She is alert.  Psychiatric:        Mood and Affect: Mood normal.      ED Course / MDM  EKG:EKG Interpretation Date/Time:  Friday June 21 2023 09:50:38 EDT Ventricular Rate:  90 PR Interval:  187 QRS Duration:  118 QT Interval:  419 QTC Calculation: 513 R Axis:   -58  Text Interpretation: Sinus rhythm Incomplete RBBB and LAFB similar to prior no stemi Confirmed by Tanda Rockers ([id number]) on 06/21/2023 9:18:49 PM  I have reviewed the labs performed to date as well as medications administered while in observation.  Recent changes in the last 24 hours include physical therapy evaluation recommending short-term skilled rehab, continue TOC evaluation  Plan  Current plan is for placement.    Glendora Score, MD 06/23/23 [phone]

## 2023-06-23 NOTE — ED Notes (Signed)
Laying on right side. Brief changed  No BM since pt arrival. Will notify next shift about no BM

## 2023-06-23 NOTE — ED Notes (Signed)
Pt reluctantly took medications. Pt stated "I want everyone to leave me alone." Daughter attempting to get pt to eat.

## 2023-06-24 DIAGNOSIS — R4182 Altered mental status, unspecified: Secondary | ICD-10-CM | POA: Diagnosis not present

## 2023-06-24 NOTE — ED Provider Notes (Signed)
Emergency Medicine Observation Re-evaluation Note  [name] is a 89 y.o. female, seen on rounds today.  Pt initially presented to the ED for complaints of Altered Mental Status Currently, the patient is resting.  Physical Exam  BP (!) 123/43   Pulse 74   Temp 97.7 F (36.5 C) (Axillary)   Resp 16   Ht 5\' 5"  (1.651 m)   Wt 90.5 kg   SpO2 96%   BMI 33.20 kg/m  Physical Exam General: nad Lungs: no resp distress  Psych: calm  ED Course / MDM  EKG:EKG Interpretation Date/Time:  Friday June 21 2023 09:50:38 EDT Ventricular Rate:  90 PR Interval:  187 QRS Duration:  118 QT Interval:  419 QTC Calculation: 513 R Axis:   -58  Text Interpretation: Sinus rhythm Incomplete RBBB and LAFB similar to prior no stemi Confirmed by Tanda Rockers ([id number]) on 06/21/2023 9:18:49 PM  I have reviewed the labs performed to date as well as medications administered while in observation.  Recent changes in the last 24 hours include no changes.  Plan  Current plan is for SW eval, placement     Sloan Leiter, DO 06/24/23 1610

## 2023-06-24 NOTE — ED Notes (Signed)
Placed pt on a bedpan, pt was unable to go. Changed brief and purewick. Pt is comfortably resting in the bed.

## 2023-06-24 NOTE — NC FL2 (Addendum)
[place] MEDICAID FL2 LEVEL OF CARE FORM     IDENTIFICATION  Patient Name: [name] Birthdate: 1934/02/21 Sex: female Admission Date (Current Location): 06/21/2023  [facility] and [place] Number:  [employer] and Address:  [facility],  [address], [place] [address]      Provider Number: [id number]  Attending Physician Name and Address:  System, Provider Not In  Relative Name and Phone Number:       Current Level of Care: Hospital Recommended Level of Care: Skilled Nursing Facility Prior Approval Number:    Date Approved/Denied:   PASRR Number: [id number]  Discharge Plan: SNF    Current Diagnoses: Patient Active Problem List   Diagnosis Date Noted   AKI (acute kidney injury) (HCC) 06/20/2023   Weakness 06/20/2023   Acute metabolic encephalopathy 06/15/2023   Pressure injury of skin 06/15/2023   UTI (urinary tract infection) 06/15/2023   Fever 06/11/2023   Edema of both legs 06/10/2023   Acute on chronic heart failure with preserved ejection fraction (HCC) 06/10/2023   Cervical stenosis of spine 05/08/2023   Paroxysmal atrial fibrillation (HCC) 04/04/2023   Hypercoagulable state due to paroxysmal atrial fibrillation (HCC) 04/04/2023   Numbness and tingling of both feet 03/23/2023   Hypoalbuminemia due to protein-calorie malnutrition (HCC) 03/23/2023   Bilateral primary osteoarthritis of knee 11/01/2022   Encounter for general adult medical examination with abnormal findings 11/01/2022   Chronic kidney disease, stage 3b (HCC) 08/21/2022   Left pontine stroke (HCC) 11/06/2021   Dyslipidemia 11/06/2021   GERD without esophagitis 11/06/2021   Benign hypertensive kidney disease with chronic kidney disease 07/31/2019   Proteinuria 07/31/2019   Secondary hyperparathyroidism of renal origin (HCC) 07/31/2019   CKD stage 3b, GFR 30-44 ml/min (HCC) 07/31/2019   CKD (chronic kidney disease), stage III (HCC) 11/23/2016   Pericardial  effusion 11/23/2016   Complete heart block (HCC) 11/16/2016   Non-ST elevation (NSTEMI) myocardial infarction (HCC) 11/16/2016   Renal insufficiency 11/03/2015   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 09/06/2015   Uncontrolled hypertension 08/31/2015   HTN (hypertension), malignant    Symptomatic bradycardia    Mixed hyperlipidemia    Epigastric fullness    Near syncope 08/28/2015   CAD S/P PCI    Essential hypertension 03/05/2013   Obesity (BMI 30-39.9) 03/05/2013   Hyperlipidemia with target LDL less than 70 03/05/2013   Obstructive sleep apnea 03/05/2013    Orientation RESPIRATION BLADDER Height & Weight     Self, Time, Situation, Place  Normal Continent Weight: 199 lb 8.3 oz (90.5 kg) Height:  5\' 5"  (165.1 cm)  BEHAVIORAL SYMPTOMS/MOOD NEUROLOGICAL BOWEL NUTRITION STATUS      Continent Diet (see dc summary)  AMBULATORY STATUS COMMUNICATION OF NEEDS Skin   Extensive Assist Verbally                         Personal Care Assistance Level of Assistance    Bathing Assistance: Maximum assistance Feeding assistance: Limited assistance Dressing Assistance: Maximum assistance     Functional Limitations Info    Sight Info: Adequate Hearing Info: Adequate Speech Info: Adequate    SPECIAL CARE FACTORS FREQUENCY  PT (By licensed PT), OT (By licensed OT)     PT Frequency: 5x week OT Frequency: 5x week            Contractures Contractures Info: Not present    Additional Factors Info    Code Status Info:  Full Allergies Info: Atorvastatin, Contrast Media (Iodinated Contrast Media), Darvon (Propoxyphene), Codeine           Current Medications (06/24/2023):  This is the current hospital active medication list Current Facility-Administered Medications  Medication Dose Route Frequency Provider Last Rate Last Admin   acetaminophen (TYLENOL) tablet 1,000 mg  1,000 mg Oral Q6H PRN Tanda Rockers A, DO   1,000 mg at 06/23/23 0114    aspirin EC tablet 81 mg  81 mg Oral Daily Kommor, Madison, MD   81 mg at 06/24/23 1028   hydrALAZINE (APRESOLINE) tablet 25 mg  25 mg Oral BID Tanda Rockers A, DO   25 mg at 06/24/23 1029   metoprolol succinate (TOPROL-XL) 24 hr tablet 12.5 mg  12.5 mg Oral Daily Tanda Rockers A, DO   12.5 mg at 06/24/23 1028   pantoprazole (PROTONIX) EC tablet 40 mg  40 mg Oral Daily Tanda Rockers A, DO   40 mg at 06/24/23 1029   polyethylene glycol (MIRALAX / GLYCOLAX) packet 17 g  17 g Oral BID Kommor, Madison, MD   17 g at 06/24/23 1026   potassium chloride SA (KLOR-CON M) CR tablet 20 mEq  20 mEq Oral Daily Kommor, Madison, MD   20 mEq at 06/24/23 1029   Rivaroxaban (XARELTO) tablet 15 mg  15 mg Oral Q supper Tanda Rockers A, DO   15 mg at 06/23/23 1655   rosuvastatin (CRESTOR) tablet 40 mg  40 mg Oral Daily Kommor, Madison, MD   40 mg at 06/24/23 1027   torsemide (DEMADEX) tablet 20 mg  20 mg Oral Daily Kommor, Madison, MD   20 mg at 06/24/23 1027   Current Outpatient Medications  Medication Sig Dispense Refill   acetaminophen (TYLENOL) 500 MG tablet Take 2 tablets (1,000 mg total) by mouth every 6 (six) hours as needed for mild pain or moderate pain.     aspirin EC 81 MG tablet Take 1 tablet (81 mg total) by mouth daily. Swallow whole.     ergocalciferol (VITAMIN D2) 1.25 MG (50000 UT) capsule Take 50,000 Units by mouth every 30 (thirty) days.     hydrALAZINE (APRESOLINE) 25 MG tablet Take 1 tablet (25 mg total) by mouth in the morning and at bedtime. 180 tablet 3   metoprolol succinate (TOPROL XL) 25 MG 24 hr tablet Take 0.5 tablets (12.5 mg total) by mouth daily. 15 tablet 5   nitroGLYCERIN (NITROSTAT) 0.4 MG SL tablet PLACE 1 TABLET UNDER THE TONGUE EVERY 5 MINUTES AS NEEDED. (Patient taking differently: Place 0.4 mg under the tongue every 5 (five) minutes as needed for chest pain.) 25 tablet 3   pantoprazole (PROTONIX) 40 MG tablet TAKE 1 TABLET BY MOUTH EVERY DAY (Patient taking differently: Take 40 mg by  mouth daily.) 90 tablet 0   potassium chloride (KLOR-CON) 20 MEQ packet Take 20 mEq by mouth daily. 30 packet 0   Rivaroxaban (XARELTO) 15 MG TABS tablet Take 1 tablet (15 mg total) by mouth daily with supper. 30 tablet 6   rosuvastatin (CRESTOR) 40 MG tablet TAKE 1 TABLET BY MOUTH EVERY DAY (Patient taking differently: Take 40 mg by mouth daily.) 90 tablet 1   torsemide (DEMADEX) 20 MG tablet Take 1 tablet (20 mg total) by mouth daily. 30 tablet 0     Discharge Medications: Please see discharge summary for a list of discharge medications.  Relevant Imaging Results:  Relevant Lab Results:   Additional Information SSN: [id number], [place]

## 2023-06-24 NOTE — ED Notes (Addendum)
Spoke with pt's daughter about returning to [facility]. Dtr stated that they want a new SNF as pt fell at [facility] and dtr states pt was left lying on the floor. CMS provider options reviewed and will refer as requested.   1510: Bed offers presented to pt/family and they have selected [facility]. TOC starting insurance auth. Will follow up once auth received to finalize dc to SNF.

## 2023-06-25 DIAGNOSIS — R4182 Altered mental status, unspecified: Secondary | ICD-10-CM | POA: Diagnosis not present

## 2023-06-25 NOTE — ED Notes (Signed)
Pt's insurance auth for SNF is still pending. Updated PT note sent to [employer] by [facility] team. Will follow to assist with dc to [facility] SNF once auth received.

## 2023-06-25 NOTE — ED Provider Notes (Signed)
Emergency Medicine Observation Re-evaluation Note  [name] is a 89 y.o. female, seen on rounds today.  Pt initially presented to the ED for complaints of Altered Mental Status Currently, the patient is awaiting placement.  Physical Exam  BP 137/67   Pulse 74   Temp 98.7 F (37.1 C) (Oral)   Resp 15   Ht 5\' 5"  (1.651 m)   Wt 90.5 kg   SpO2 95%   BMI 33.20 kg/m  Physical Exam Alert and in no acute distress  ED Course / MDM  EKG:EKG Interpretation Date/Time:  Friday June 21 2023 09:50:38 EDT Ventricular Rate:  90 PR Interval:  187 QRS Duration:  118 QT Interval:  419 QTC Calculation: 513 R Axis:   -58  Text Interpretation: Sinus rhythm Incomplete RBBB and LAFB similar to prior no stemi Confirmed by Tanda Rockers ([id number]) on 06/21/2023 9:18:49 PM  I have reviewed the labs performed to date as well as medications administered while in observation.  Recent changes in the last 24 hours include none  Plan  Current plan is for placement.    Bethann Berkshire, MD 06/25/23 [phone]

## 2023-06-25 NOTE — ED Notes (Signed)
Pt is now cleaned up from a BM on the bedpan. While I was assisting family to change pt family member pulled off sacral patch. There is now a new sacral patch in place. Pt is now dry and clean.

## 2023-06-25 NOTE — ED Notes (Signed)
Pt assisted with the bedpan.

## 2023-06-25 NOTE — Progress Notes (Signed)
Physical Therapy Treatment Patient Details Name: [name] MRN: [id number] DOB: Feb 12, 1934 Today's Date: 06/25/2023   History of Present Illness Pt is a 89 yo female [facility] due to reported A[facility] at [facility]. Pt was recently admittd [facility] due to CHF exacerbation and BLE edema. Per pt, unwilling to return to previous rehab facility.    PT Comments  Patient presents with her son in room and requires encouragement for participating with therapy.  Patient demonstrates slow labored movement for sitting up at bedside with c/o increased pain with pressure to legs and movement of RUE.  Patient had difficulty maintaining standing balance using RW due to BLE weakness with frequent buckling of knees and unable transfer to chair.  Patient required Max assist for repositioning when put back to bed.  Patient left with son present in room.  Patient will benefit from continued skilled physical therapy in hospital and recommended venue below to increase strength, balance, endurance for safe ADLs and gait.      If plan is discharge home, recommend the following: A lot of help with walking and/or transfers;A lot of help with bathing/dressing/bathroom;Help with stairs or ramp for entrance   Can travel by private vehicle     No  Equipment Recommendations       Recommendations for Other Services       Precautions / Restrictions Precautions Precautions: Fall Restrictions Weight Bearing Restrictions: No     Mobility  Bed Mobility Overal bed mobility: Needs Assistance Bed Mobility: Supine to Sit     Supine to sit: Mod assist, Max assist Sit to supine: Mod assist, Max assist   General bed mobility comments: increased time, labored movement    Transfers Overall transfer level: Needs assistance Equipment used: Rolling walker (2 wheels) Transfers: Sit to/from Stand Sit to Stand: Mod assist, Max assist           General transfer comment: very unsteady on feet with  buckling of knees    Ambulation/Gait                   Stairs             Wheelchair Mobility     Tilt Bed    Modified Rankin (Stroke Patients Only)       Balance Overall balance assessment: Needs assistance Sitting-balance support: Feet supported, Bilateral upper extremity supported Sitting balance-Leahy Scale: Fair Sitting balance - Comments: seated at EOB Postural control: Posterior lean Standing balance support: Reliant on assistive device for balance, During functional activity, Bilateral upper extremity supported Standing balance-Leahy Scale: Poor Standing balance comment: using RW                            Cognition Arousal: Alert Behavior During Therapy: Agitated, Anxious   Area of Impairment: Safety/judgement, Following commands                 Orientation Level: Person, Place     Following Commands: Follows one step commands with increased time Safety/Judgement: Decreased awareness of safety   Problem Solving: Decreased initiation General Comments: Patient very anxious and slightly confused        Exercises      General Comments        Pertinent Vitals/Pain Pain Assessment Pain Assessment: Faces Faces Pain Scale: Hurts a little bit Pain Location: BLE, hands with pressure Pain Descriptors / Indicators: Discomfort, Guarding, Grimacing, Moaning    Home  Living                          Prior Function            PT Goals (current goals can now be found in the care plan section) Acute Rehab PT Goals Patient Stated Goal: Return home PT Goal Formulation: With patient/family Time For Goal Achievement: 07/06/23 Potential to Achieve Goals: Good    Frequency    Min 2X/week      PT Plan      Co-evaluation              AM-PAC PT "6 Clicks" Mobility   Outcome Measure  Help needed turning from your back to your side while in a flat bed without using bedrails?: A Lot Help needed moving  from lying on your back to sitting on the side of a flat bed without using bedrails?: A Lot Help needed moving to and from a bed to a chair (including a wheelchair)?: A Lot Help needed standing up from a chair using your arms (e.g., wheelchair or bedside chair)?: A Lot Help needed to walk in hospital room?: A Lot Help needed climbing 3-5 steps with a railing? : Total 6 Click Score: 11    End of Session Equipment Utilized During Treatment: Gait belt Activity Tolerance: Patient tolerated treatment well;Patient limited by fatigue;Patient limited by pain Patient left: in bed;with call bell/phone within reach;with family/visitor present Nurse Communication: Mobility status PT Visit Diagnosis: Unsteadiness on feet (R26.81);Other abnormalities of gait and mobility (R26.89);Muscle weakness (generalized) (M62.81) Pain - Right/Left: Right Pain - part of body: Shoulder;Hand;Leg     Time: 1610-9604 PT Time Calculation (min) (ACUTE ONLY): 24 min  Charges:    $Therapeutic Activity: 23-37 mins PT General Charges $$ ACUTE PT VISIT: 1 Visit                     10:51 AM, 06/25/23 Ocie Bob, MPT Physical Therapist with [facility] [phone] office [phone] mobile phone

## 2023-06-26 ENCOUNTER — Encounter: Payer: Self-pay | Admitting: Neurology

## 2023-06-26 ENCOUNTER — Ambulatory Visit: Payer: Medicare HMO | Admitting: Neurology

## 2023-06-26 DIAGNOSIS — R4182 Altered mental status, unspecified: Secondary | ICD-10-CM | POA: Diagnosis not present

## 2023-06-26 MED ORDER — POTASSIUM CHLORIDE 20 MEQ PO PACK
20.0000 meq | PACK | Freq: Once | ORAL | Status: AC
  Administered 2023-06-26: 20 meq via ORAL
  Filled 2023-06-26: qty 1

## 2023-06-26 NOTE — ED Notes (Signed)
Patients insurance auth for SNF at [facility] is still pending at this time. TOC continues to follow for updates.

## 2023-06-26 NOTE — ED Notes (Signed)
Pt had a large BM and was cleaned by this RN and EDT Darin Engels), new sheets and blankets provided. Call bell within reach.

## 2023-06-26 NOTE — ED Provider Notes (Signed)
Emergency Medicine Observation Re-evaluation Note  [name] is a 89 y.o. female, seen on rounds today.  Pt initially presented to the ED for complaints of Altered Mental Status Currently, the patient is awaiting placement.  Physical Exam  BP 130/72 (BP Location: Right Arm)   Pulse 78   Temp 98.2 F (36.8 C) (Oral)   Resp 18   Ht 5\' 5"  (1.651 m)   Wt 90.5 kg   SpO2 98%   BMI 33.20 kg/m  Physical Exam Alert and in no acute distress  ED Course / MDM  EKG:EKG Interpretation Date/Time:  Friday June 21 2023 09:50:38 EDT Ventricular Rate:  90 PR Interval:  187 QRS Duration:  118 QT Interval:  419 QTC Calculation: 513 R Axis:   -58  Text Interpretation: Sinus rhythm Incomplete RBBB and LAFB similar to prior no stemi Confirmed by Tanda Rockers ([id number]) on 06/21/2023 9:18:49 PM  I have reviewed the labs performed to date as well as medications administered while in observation.  Recent changes in the last 24 hours include none.  Plan  Current plan is for placement.    Bethann Berkshire, MD 06/26/23 [phone]

## 2023-06-26 NOTE — ED Notes (Signed)
Pt placed on hospital bed for comfort and pure wick changed.

## 2023-06-27 DIAGNOSIS — Z8673 Personal history of transient ischemic attack (TIA), and cerebral infarction without residual deficits: Secondary | ICD-10-CM | POA: Diagnosis not present

## 2023-06-27 DIAGNOSIS — R4182 Altered mental status, unspecified: Secondary | ICD-10-CM | POA: Diagnosis not present

## 2023-06-27 DIAGNOSIS — Z955 Presence of coronary angioplasty implant and graft: Secondary | ICD-10-CM | POA: Diagnosis not present

## 2023-06-27 DIAGNOSIS — G4730 Sleep apnea, unspecified: Secondary | ICD-10-CM | POA: Diagnosis not present

## 2023-06-27 DIAGNOSIS — M62562 Muscle wasting and atrophy, not elsewhere classified, left lower leg: Secondary | ICD-10-CM | POA: Diagnosis not present

## 2023-06-27 DIAGNOSIS — Z7982 Long term (current) use of aspirin: Secondary | ICD-10-CM | POA: Diagnosis not present

## 2023-06-27 DIAGNOSIS — Z7901 Long term (current) use of anticoagulants: Secondary | ICD-10-CM | POA: Diagnosis not present

## 2023-06-27 DIAGNOSIS — E66811 Obesity, class 1: Secondary | ICD-10-CM | POA: Diagnosis not present

## 2023-06-27 DIAGNOSIS — R410 Disorientation, unspecified: Secondary | ICD-10-CM | POA: Diagnosis not present

## 2023-06-27 DIAGNOSIS — Z91041 Radiographic dye allergy status: Secondary | ICD-10-CM | POA: Diagnosis not present

## 2023-06-27 DIAGNOSIS — E782 Mixed hyperlipidemia: Secondary | ICD-10-CM | POA: Diagnosis not present

## 2023-06-27 DIAGNOSIS — B9620 Unspecified Escherichia coli [E. coli] as the cause of diseases classified elsewhere: Secondary | ICD-10-CM | POA: Diagnosis not present

## 2023-06-27 DIAGNOSIS — R2689 Other abnormalities of gait and mobility: Secondary | ICD-10-CM | POA: Diagnosis not present

## 2023-06-27 DIAGNOSIS — Z1624 Resistance to multiple antibiotics: Secondary | ICD-10-CM | POA: Diagnosis not present

## 2023-06-27 DIAGNOSIS — R531 Weakness: Secondary | ICD-10-CM | POA: Diagnosis not present

## 2023-06-27 DIAGNOSIS — I2510 Atherosclerotic heart disease of native coronary artery without angina pectoris: Secondary | ICD-10-CM | POA: Diagnosis not present

## 2023-06-27 DIAGNOSIS — G4700 Insomnia, unspecified: Secondary | ICD-10-CM | POA: Diagnosis not present

## 2023-06-27 DIAGNOSIS — N1831 Chronic kidney disease, stage 3a: Secondary | ICD-10-CM | POA: Diagnosis not present

## 2023-06-27 DIAGNOSIS — R630 Anorexia: Secondary | ICD-10-CM | POA: Diagnosis not present

## 2023-06-27 DIAGNOSIS — I5032 Chronic diastolic (congestive) heart failure: Secondary | ICD-10-CM | POA: Diagnosis not present

## 2023-06-27 DIAGNOSIS — K449 Diaphragmatic hernia without obstruction or gangrene: Secondary | ICD-10-CM | POA: Diagnosis not present

## 2023-06-27 DIAGNOSIS — N3000 Acute cystitis without hematuria: Secondary | ICD-10-CM | POA: Diagnosis not present

## 2023-06-27 DIAGNOSIS — N390 Urinary tract infection, site not specified: Secondary | ICD-10-CM | POA: Diagnosis not present

## 2023-06-27 DIAGNOSIS — A419 Sepsis, unspecified organism: Secondary | ICD-10-CM | POA: Diagnosis not present

## 2023-06-27 DIAGNOSIS — M62561 Muscle wasting and atrophy, not elsewhere classified, right lower leg: Secondary | ICD-10-CM | POA: Diagnosis not present

## 2023-06-27 DIAGNOSIS — I480 Paroxysmal atrial fibrillation: Secondary | ICD-10-CM | POA: Diagnosis not present

## 2023-06-27 DIAGNOSIS — Z87440 Personal history of urinary (tract) infections: Secondary | ICD-10-CM | POA: Diagnosis not present

## 2023-06-27 DIAGNOSIS — I252 Old myocardial infarction: Secondary | ICD-10-CM | POA: Diagnosis not present

## 2023-06-27 DIAGNOSIS — M150 Primary generalized (osteo)arthritis: Secondary | ICD-10-CM | POA: Diagnosis not present

## 2023-06-27 DIAGNOSIS — Z885 Allergy status to narcotic agent status: Secondary | ICD-10-CM | POA: Diagnosis not present

## 2023-06-27 DIAGNOSIS — N1832 Chronic kidney disease, stage 3b: Secondary | ICD-10-CM | POA: Diagnosis not present

## 2023-06-27 DIAGNOSIS — N179 Acute kidney failure, unspecified: Secondary | ICD-10-CM | POA: Diagnosis not present

## 2023-06-27 DIAGNOSIS — R54 Age-related physical debility: Secondary | ICD-10-CM | POA: Diagnosis not present

## 2023-06-27 DIAGNOSIS — E785 Hyperlipidemia, unspecified: Secondary | ICD-10-CM | POA: Diagnosis not present

## 2023-06-27 DIAGNOSIS — G4733 Obstructive sleep apnea (adult) (pediatric): Secondary | ICD-10-CM | POA: Diagnosis not present

## 2023-06-27 DIAGNOSIS — Z743 Need for continuous supervision: Secondary | ICD-10-CM | POA: Diagnosis not present

## 2023-06-27 DIAGNOSIS — E876 Hypokalemia: Secondary | ICD-10-CM | POA: Diagnosis not present

## 2023-06-27 DIAGNOSIS — I130 Hypertensive heart and chronic kidney disease with heart failure and stage 1 through stage 4 chronic kidney disease, or unspecified chronic kidney disease: Secondary | ICD-10-CM | POA: Diagnosis not present

## 2023-06-27 DIAGNOSIS — Z888 Allergy status to other drugs, medicaments and biological substances status: Secondary | ICD-10-CM | POA: Diagnosis not present

## 2023-06-27 DIAGNOSIS — Z7401 Bed confinement status: Secondary | ICD-10-CM | POA: Diagnosis not present

## 2023-06-27 DIAGNOSIS — G9341 Metabolic encephalopathy: Secondary | ICD-10-CM | POA: Diagnosis not present

## 2023-06-27 DIAGNOSIS — N183 Chronic kidney disease, stage 3 unspecified: Secondary | ICD-10-CM | POA: Diagnosis not present

## 2023-06-27 DIAGNOSIS — E7849 Other hyperlipidemia: Secondary | ICD-10-CM | POA: Diagnosis not present

## 2023-06-27 DIAGNOSIS — M1990 Unspecified osteoarthritis, unspecified site: Secondary | ICD-10-CM | POA: Diagnosis not present

## 2023-06-27 DIAGNOSIS — D649 Anemia, unspecified: Secondary | ICD-10-CM | POA: Diagnosis not present

## 2023-06-27 DIAGNOSIS — I10 Essential (primary) hypertension: Secondary | ICD-10-CM | POA: Diagnosis not present

## 2023-06-27 DIAGNOSIS — R6520 Severe sepsis without septic shock: Secondary | ICD-10-CM | POA: Diagnosis not present

## 2023-06-27 NOTE — ED Provider Notes (Signed)
Emergency Medicine Observation Re-evaluation Note  [name] is a 89 y.o. female, seen on rounds today.  Pt initially presented to the ED for complaints of Altered Mental Status Currently, the patient is resting comfortably with no complaints Physical Exam  BP 111/64   Pulse 72   Temp 98.7 F (37.1 C) (Oral)   Resp 18   Ht 5\' 5"  (1.651 m)   Wt 90.5 kg   SpO2 96%   BMI 33.20 kg/m  Physical Exam General: well appearing  Cardiac: normal rate  Lungs: normal WOB Psych: Calm and cooperative   ED Course / MDM  EKG:EKG Interpretation Date/Time:  Friday June 21 2023 09:50:38 EDT Ventricular Rate:  90 PR Interval:  187 QRS Duration:  118 QT Interval:  419 QTC Calculation: 513 R Axis:   -58  Text Interpretation: Sinus rhythm Incomplete RBBB and LAFB similar to prior no stemi Confirmed by Tanda Rockers ([id number]) on 06/21/2023 9:18:49 PM  I have reviewed the labs performed to date as well as medications administered while in observation.  Recent changes in the last 24 hours include Accepted at [facility].  Plan  Current plan is for transfer/discharge to facility.     Coral Spikes, DO 06/27/23 1146

## 2023-06-27 NOTE — Discharge Instructions (Addendum)
Please follow-up with your primary doctor.  Please take medications had been prescribed to you.

## 2023-06-27 NOTE — ED Notes (Signed)
Pt has insurance auth for SNF. Updated Destiny at [facility] and they can admit pt today.   Updated pt/dtr and they remain in agreement with the dc plan.   DC clinical to be sent electronically. RN to call report and Diplomatic Services operational officer will call EMS.  No other TOC needs for dc.

## 2023-07-02 DIAGNOSIS — R6520 Severe sepsis without septic shock: Secondary | ICD-10-CM | POA: Diagnosis not present

## 2023-07-02 DIAGNOSIS — K5900 Constipation, unspecified: Secondary | ICD-10-CM | POA: Diagnosis not present

## 2023-07-02 DIAGNOSIS — R630 Anorexia: Secondary | ICD-10-CM | POA: Diagnosis not present

## 2023-07-02 DIAGNOSIS — M1990 Unspecified osteoarthritis, unspecified site: Secondary | ICD-10-CM | POA: Diagnosis not present

## 2023-07-02 DIAGNOSIS — M62561 Muscle wasting and atrophy, not elsewhere classified, right lower leg: Secondary | ICD-10-CM | POA: Diagnosis not present

## 2023-07-02 DIAGNOSIS — I252 Old myocardial infarction: Secondary | ICD-10-CM | POA: Diagnosis not present

## 2023-07-02 DIAGNOSIS — R109 Unspecified abdominal pain: Secondary | ICD-10-CM | POA: Diagnosis not present

## 2023-07-02 DIAGNOSIS — N3000 Acute cystitis without hematuria: Secondary | ICD-10-CM | POA: Diagnosis not present

## 2023-07-02 DIAGNOSIS — G4700 Insomnia, unspecified: Secondary | ICD-10-CM | POA: Diagnosis not present

## 2023-07-02 DIAGNOSIS — Z1624 Resistance to multiple antibiotics: Secondary | ICD-10-CM | POA: Diagnosis not present

## 2023-07-02 DIAGNOSIS — Z87440 Personal history of urinary (tract) infections: Secondary | ICD-10-CM | POA: Diagnosis not present

## 2023-07-02 DIAGNOSIS — Z955 Presence of coronary angioplasty implant and graft: Secondary | ICD-10-CM | POA: Diagnosis not present

## 2023-07-02 DIAGNOSIS — Z792 Long term (current) use of antibiotics: Secondary | ICD-10-CM | POA: Diagnosis not present

## 2023-07-02 DIAGNOSIS — Z91041 Radiographic dye allergy status: Secondary | ICD-10-CM | POA: Diagnosis not present

## 2023-07-02 DIAGNOSIS — M62562 Muscle wasting and atrophy, not elsewhere classified, left lower leg: Secondary | ICD-10-CM | POA: Diagnosis not present

## 2023-07-02 DIAGNOSIS — Z7982 Long term (current) use of aspirin: Secondary | ICD-10-CM | POA: Diagnosis not present

## 2023-07-02 DIAGNOSIS — Z885 Allergy status to narcotic agent status: Secondary | ICD-10-CM | POA: Diagnosis not present

## 2023-07-02 DIAGNOSIS — Z79899 Other long term (current) drug therapy: Secondary | ICD-10-CM | POA: Diagnosis not present

## 2023-07-02 DIAGNOSIS — I10 Essential (primary) hypertension: Secondary | ICD-10-CM | POA: Diagnosis not present

## 2023-07-02 DIAGNOSIS — I2510 Atherosclerotic heart disease of native coronary artery without angina pectoris: Secondary | ICD-10-CM | POA: Diagnosis not present

## 2023-07-02 DIAGNOSIS — Z743 Need for continuous supervision: Secondary | ICD-10-CM | POA: Diagnosis not present

## 2023-07-02 DIAGNOSIS — G4733 Obstructive sleep apnea (adult) (pediatric): Secondary | ICD-10-CM | POA: Diagnosis not present

## 2023-07-02 DIAGNOSIS — R2689 Other abnormalities of gait and mobility: Secondary | ICD-10-CM | POA: Diagnosis not present

## 2023-07-02 DIAGNOSIS — E782 Mixed hyperlipidemia: Secondary | ICD-10-CM | POA: Diagnosis not present

## 2023-07-02 DIAGNOSIS — I5022 Chronic systolic (congestive) heart failure: Secondary | ICD-10-CM | POA: Diagnosis not present

## 2023-07-02 DIAGNOSIS — A419 Sepsis, unspecified organism: Secondary | ICD-10-CM | POA: Diagnosis not present

## 2023-07-02 DIAGNOSIS — B9620 Unspecified Escherichia coli [E. coli] as the cause of diseases classified elsewhere: Secondary | ICD-10-CM | POA: Diagnosis not present

## 2023-07-02 DIAGNOSIS — I5032 Chronic diastolic (congestive) heart failure: Secondary | ICD-10-CM | POA: Diagnosis not present

## 2023-07-02 DIAGNOSIS — N390 Urinary tract infection, site not specified: Secondary | ICD-10-CM | POA: Diagnosis not present

## 2023-07-02 DIAGNOSIS — R4182 Altered mental status, unspecified: Secondary | ICD-10-CM | POA: Diagnosis not present

## 2023-07-02 DIAGNOSIS — N179 Acute kidney failure, unspecified: Secondary | ICD-10-CM | POA: Diagnosis not present

## 2023-07-02 DIAGNOSIS — Z888 Allergy status to other drugs, medicaments and biological substances status: Secondary | ICD-10-CM | POA: Diagnosis not present

## 2023-07-02 DIAGNOSIS — K449 Diaphragmatic hernia without obstruction or gangrene: Secondary | ICD-10-CM | POA: Diagnosis not present

## 2023-07-02 DIAGNOSIS — N183 Chronic kidney disease, stage 3 unspecified: Secondary | ICD-10-CM | POA: Diagnosis not present

## 2023-07-02 DIAGNOSIS — R531 Weakness: Secondary | ICD-10-CM | POA: Diagnosis not present

## 2023-07-02 DIAGNOSIS — N1832 Chronic kidney disease, stage 3b: Secondary | ICD-10-CM | POA: Diagnosis not present

## 2023-07-02 DIAGNOSIS — E66811 Obesity, class 1: Secondary | ICD-10-CM | POA: Diagnosis not present

## 2023-07-02 DIAGNOSIS — E785 Hyperlipidemia, unspecified: Secondary | ICD-10-CM | POA: Diagnosis not present

## 2023-07-02 DIAGNOSIS — I480 Paroxysmal atrial fibrillation: Secondary | ICD-10-CM | POA: Diagnosis not present

## 2023-07-02 DIAGNOSIS — G9341 Metabolic encephalopathy: Secondary | ICD-10-CM | POA: Diagnosis not present

## 2023-07-02 DIAGNOSIS — R410 Disorientation, unspecified: Secondary | ICD-10-CM | POA: Diagnosis not present

## 2023-07-02 DIAGNOSIS — I130 Hypertensive heart and chronic kidney disease with heart failure and stage 1 through stage 4 chronic kidney disease, or unspecified chronic kidney disease: Secondary | ICD-10-CM | POA: Diagnosis not present

## 2023-07-02 DIAGNOSIS — Z8673 Personal history of transient ischemic attack (TIA), and cerebral infarction without residual deficits: Secondary | ICD-10-CM | POA: Diagnosis not present

## 2023-07-02 DIAGNOSIS — D649 Anemia, unspecified: Secondary | ICD-10-CM | POA: Diagnosis not present

## 2023-07-02 DIAGNOSIS — Z7901 Long term (current) use of anticoagulants: Secondary | ICD-10-CM | POA: Diagnosis not present

## 2023-07-02 DIAGNOSIS — E876 Hypokalemia: Secondary | ICD-10-CM | POA: Diagnosis not present

## 2023-07-02 DIAGNOSIS — R54 Age-related physical debility: Secondary | ICD-10-CM | POA: Diagnosis not present

## 2023-07-02 DIAGNOSIS — I4891 Unspecified atrial fibrillation: Secondary | ICD-10-CM | POA: Diagnosis not present

## 2023-07-03 ENCOUNTER — Telehealth: Payer: Self-pay

## 2023-07-03 NOTE — Telephone Encounter (Signed)
Call to patient to advise that appointment was moved to 4:15 pm on 10/29. Left message to call back.

## 2023-07-05 ENCOUNTER — Ambulatory Visit: Payer: Medicare HMO | Admitting: Nurse Practitioner

## 2023-07-08 ENCOUNTER — Ambulatory Visit (INDEPENDENT_AMBULATORY_CARE_PROVIDER_SITE_OTHER): Payer: Medicare HMO

## 2023-07-08 DIAGNOSIS — I2510 Atherosclerotic heart disease of native coronary artery without angina pectoris: Secondary | ICD-10-CM | POA: Diagnosis not present

## 2023-07-08 DIAGNOSIS — I6782 Cerebral ischemia: Secondary | ICD-10-CM | POA: Diagnosis not present

## 2023-07-08 DIAGNOSIS — R4182 Altered mental status, unspecified: Secondary | ICD-10-CM | POA: Diagnosis not present

## 2023-07-08 DIAGNOSIS — G4733 Obstructive sleep apnea (adult) (pediatric): Secondary | ICD-10-CM | POA: Diagnosis not present

## 2023-07-08 DIAGNOSIS — I509 Heart failure, unspecified: Secondary | ICD-10-CM | POA: Diagnosis not present

## 2023-07-08 DIAGNOSIS — G4700 Insomnia, unspecified: Secondary | ICD-10-CM | POA: Diagnosis not present

## 2023-07-08 DIAGNOSIS — K5900 Constipation, unspecified: Secondary | ICD-10-CM | POA: Diagnosis not present

## 2023-07-08 DIAGNOSIS — I5032 Chronic diastolic (congestive) heart failure: Secondary | ICD-10-CM | POA: Diagnosis not present

## 2023-07-08 DIAGNOSIS — N179 Acute kidney failure, unspecified: Secondary | ICD-10-CM | POA: Diagnosis not present

## 2023-07-08 DIAGNOSIS — N183 Chronic kidney disease, stage 3 unspecified: Secondary | ICD-10-CM | POA: Diagnosis not present

## 2023-07-08 DIAGNOSIS — R404 Transient alteration of awareness: Secondary | ICD-10-CM | POA: Diagnosis not present

## 2023-07-08 DIAGNOSIS — E782 Mixed hyperlipidemia: Secondary | ICD-10-CM | POA: Diagnosis not present

## 2023-07-08 DIAGNOSIS — I10 Essential (primary) hypertension: Secondary | ICD-10-CM | POA: Diagnosis not present

## 2023-07-08 DIAGNOSIS — Z8673 Personal history of transient ischemic attack (TIA), and cerebral infarction without residual deficits: Secondary | ICD-10-CM | POA: Diagnosis not present

## 2023-07-08 DIAGNOSIS — N3000 Acute cystitis without hematuria: Secondary | ICD-10-CM | POA: Diagnosis not present

## 2023-07-08 DIAGNOSIS — M1990 Unspecified osteoarthritis, unspecified site: Secondary | ICD-10-CM | POA: Diagnosis not present

## 2023-07-08 DIAGNOSIS — M62561 Muscle wasting and atrophy, not elsewhere classified, right lower leg: Secondary | ICD-10-CM | POA: Diagnosis not present

## 2023-07-08 DIAGNOSIS — Z87440 Personal history of urinary (tract) infections: Secondary | ICD-10-CM | POA: Diagnosis not present

## 2023-07-08 DIAGNOSIS — Z955 Presence of coronary angioplasty implant and graft: Secondary | ICD-10-CM | POA: Diagnosis not present

## 2023-07-08 DIAGNOSIS — N1832 Chronic kidney disease, stage 3b: Secondary | ICD-10-CM | POA: Diagnosis not present

## 2023-07-08 DIAGNOSIS — N189 Chronic kidney disease, unspecified: Secondary | ICD-10-CM | POA: Diagnosis not present

## 2023-07-08 DIAGNOSIS — I63412 Cerebral infarction due to embolism of left middle cerebral artery: Secondary | ICD-10-CM | POA: Diagnosis not present

## 2023-07-08 DIAGNOSIS — I5022 Chronic systolic (congestive) heart failure: Secondary | ICD-10-CM | POA: Diagnosis not present

## 2023-07-08 DIAGNOSIS — E66811 Obesity, class 1: Secondary | ICD-10-CM | POA: Diagnosis not present

## 2023-07-08 DIAGNOSIS — Z7901 Long term (current) use of anticoagulants: Secondary | ICD-10-CM | POA: Diagnosis not present

## 2023-07-08 DIAGNOSIS — Z7982 Long term (current) use of aspirin: Secondary | ICD-10-CM | POA: Diagnosis not present

## 2023-07-08 DIAGNOSIS — Z79899 Other long term (current) drug therapy: Secondary | ICD-10-CM | POA: Diagnosis not present

## 2023-07-08 DIAGNOSIS — B9620 Unspecified Escherichia coli [E. coli] as the cause of diseases classified elsewhere: Secondary | ICD-10-CM | POA: Diagnosis not present

## 2023-07-08 DIAGNOSIS — R2689 Other abnormalities of gait and mobility: Secondary | ICD-10-CM | POA: Diagnosis not present

## 2023-07-08 DIAGNOSIS — D649 Anemia, unspecified: Secondary | ICD-10-CM | POA: Diagnosis not present

## 2023-07-08 DIAGNOSIS — E876 Hypokalemia: Secondary | ICD-10-CM | POA: Diagnosis not present

## 2023-07-08 DIAGNOSIS — I252 Old myocardial infarction: Secondary | ICD-10-CM | POA: Diagnosis not present

## 2023-07-08 DIAGNOSIS — E870 Hyperosmolality and hypernatremia: Secondary | ICD-10-CM | POA: Diagnosis not present

## 2023-07-08 DIAGNOSIS — R0902 Hypoxemia: Secondary | ICD-10-CM | POA: Diagnosis not present

## 2023-07-08 DIAGNOSIS — I4891 Unspecified atrial fibrillation: Secondary | ICD-10-CM | POA: Diagnosis not present

## 2023-07-08 DIAGNOSIS — I130 Hypertensive heart and chronic kidney disease with heart failure and stage 1 through stage 4 chronic kidney disease, or unspecified chronic kidney disease: Secondary | ICD-10-CM | POA: Diagnosis not present

## 2023-07-08 DIAGNOSIS — Z743 Need for continuous supervision: Secondary | ICD-10-CM | POA: Diagnosis not present

## 2023-07-08 DIAGNOSIS — L8995 Pressure ulcer of unspecified site, unstageable: Secondary | ICD-10-CM | POA: Diagnosis not present

## 2023-07-08 DIAGNOSIS — Z792 Long term (current) use of antibiotics: Secondary | ICD-10-CM | POA: Diagnosis not present

## 2023-07-08 DIAGNOSIS — M62562 Muscle wasting and atrophy, not elsewhere classified, left lower leg: Secondary | ICD-10-CM | POA: Diagnosis not present

## 2023-07-08 DIAGNOSIS — R630 Anorexia: Secondary | ICD-10-CM | POA: Diagnosis not present

## 2023-07-08 DIAGNOSIS — I480 Paroxysmal atrial fibrillation: Secondary | ICD-10-CM | POA: Diagnosis not present

## 2023-07-08 DIAGNOSIS — G9341 Metabolic encephalopathy: Secondary | ICD-10-CM | POA: Diagnosis not present

## 2023-07-08 DIAGNOSIS — N390 Urinary tract infection, site not specified: Secondary | ICD-10-CM | POA: Diagnosis not present

## 2023-07-08 DIAGNOSIS — R54 Age-related physical debility: Secondary | ICD-10-CM | POA: Diagnosis not present

## 2023-07-08 DIAGNOSIS — A419 Sepsis, unspecified organism: Secondary | ICD-10-CM | POA: Diagnosis not present

## 2023-07-08 LAB — CUP PACEART REMOTE DEVICE CHECK
Date Time Interrogation Session: 20241027231526
Implantable Pulse Generator Implant Date: 20240506

## 2023-07-09 ENCOUNTER — Encounter: Payer: Self-pay | Admitting: Neurology

## 2023-07-09 ENCOUNTER — Ambulatory Visit (INDEPENDENT_AMBULATORY_CARE_PROVIDER_SITE_OTHER): Payer: Medicare HMO | Admitting: Neurology

## 2023-07-09 VITALS — BP 130/74 | HR 75 | Resp 16 | Ht 65.0 in

## 2023-07-09 DIAGNOSIS — I63412 Cerebral infarction due to embolism of left middle cerebral artery: Secondary | ICD-10-CM

## 2023-07-09 DIAGNOSIS — G9341 Metabolic encephalopathy: Secondary | ICD-10-CM | POA: Diagnosis not present

## 2023-07-09 DIAGNOSIS — N390 Urinary tract infection, site not specified: Secondary | ICD-10-CM | POA: Diagnosis not present

## 2023-07-09 NOTE — Progress Notes (Signed)
[facility]  PATIENT: [name] DOB: 1933/11/29  REQUESTING CLINICIAN: [name], MD HISTORY FROM: Patient and daughter  REASON FOR VISIT: Stroke follow up   HISTORICAL  CHIEF COMPLAINT:  Chief Complaint  Patient presents with   Cerebrovascular Accident    RM12, DAUGHTER PRESENT, CVA [id number] LEFT [facility] YESTERDAY VERY FATIGUED   INTERVAL HISTORY 07/09/2023 Patient presents today for follow-up, last visit was in April.  She is accompanied by her daughter.  At last visit, we planned for loop recorder which confirmed atrial fibrillation.  Her Plavix discontinued and patient is on Xarelto and aspirin.  Unfortunately in the past couple months, patient has been going in and out of the hospital for UTI.  She was recently discharged as of yesterday at [facility] for multidrug-resistant E-Coli UTI and sepsis.  She is still on antibiotics at the nursing home. Today she is very somnolent and tired, and encephalopathy.   INTERVAL HISTORY 12/19/2022:  Patient presents today for follow-up, she is accompanied by her daughter.  Since last visit she has been doing well until December 2023 when she was admitted to the hospital for slurred speech and dehydration and was found to have multiple stroke, etiology likely embolic.  Per daughter patient was complaining of slurred speech for about 5 days.  Initially was thought to be dehydration, she increased her fluid intake but symptoms did not resolved.  In the ED her MRI was completed and showed multiple punctate strokes within the left hemisphere concerning for embolic etiology.  She completed DAPT aspirin and Plavix for 21 days and currently is on Plavix alone.  She was recommended cardiac monitoring to rule out atrial fibrillation but this has not been completed.  From her previous stroke in February 2022 she did have a 30-day cardiac monitor which was unrevealing.  Currently she does not have any deficit.  She feels she is back  to her normal self.   Hospital Course and Summary  As per H&P written by Dr. Arville Care on 08/20/2022 [name] is a 89 y.o. African-American female with medical history significant for osteoarthritis, CVA, hypertension, dyslipidemia, coronary artery disease, GERD, OSA and obesity, who presented to the emergency room with acute onset of expressive dysphasia that started about 5 days ago.  The patient lives at home alone and has been independent.  Her daughters live close to her and frequently check on her.  Her family noticed that last week she was having difficulty finding words and her symptoms persisted.  She has been having frontal headache over the last couple of days for which she has been taking Tylenol with improvement.  She was recently seen in urgent care and treated for URI.  This was before her symptoms started.  She had a CVA earlier this year without residual deficits.  No unilateral muscle weakness or paresthesias.  No gait abnormalities.  No visual loss, blurred vision or diplopia, tinnitus or vertigo.  No fever or chills.  No chest pain or palpitations.  No cough or wheezing.  No nausea or vomiting or abdominal pain.  No dysuria, oliguria or hematuria or flank pain.   Brain MRI with MRA of the head without contrast revealed the following: 1. Multifocal punctate acute ischemia in the left hemisphere, within multiple vascular territories. The greatest number are located in the medial left temporal lobe. Distribution is most consistent with embolic disease from the left carotid or distal aortic arch. 2. Normal intracranial MRA.   Dr. Wilford Corner  was contacted about the patient and recommended admission here.  Teleneurology consult will be obtained in a.m.  The patient will be admitted to a medical telemetry bed for further evaluation and management.   Assessment and Plan: * Acute CVA (cerebrovascular accident) (HCC) - The patient has multiple left hemispheric punctate ischemic infarcts mainly  in the left temporal lobe concerning for embolic disease. -Case discussed with neurology service who recommended dual antiplatelet therapy for 3 weeks with transition to monotherapy using Plavix. -Continue Crestor for LDL goal less than 70 -Continue risk factor modifications including blood pressure control towards normotension and loop recorder placement by cardiology as an outpatient. -Physical therapy has assessed patient with recommendations for outpatient PT.   HISTORY OF PRESENT ILLNESS:  This is a 89 year old woman with past medical history hypertension, hyperlipidemia, GERD, obstructive sleep apnea, CKD, coronary artery disease status post 2 stents who is presenting to clinic after being admitted to the hospital for right lower leg weakness and found to have a left pontine stroke.  Stroke etiology likely small vessel disease but cannot rule out large vessel disease.  She was started on aspirin and Plavix for total of 21 days and plan was to continue with aspirin thereafter.  Since leaving the hospital, she has declined PT, OT because she feels back to her normal strength.  She is currently wearing a cardiac monitor for total of 1 month. A month later after initial admission for stroke, she presented to the hospital for right thigh numbness and was diagnosed with meralgia paresthetica. Hospital course below    Hospital summary and course below "[name] is a 89 y.o. African-American female with medical history significant for coronary artery disease status post PCI and stent, hypertension, dyslipidemia, GERD, OSA, CKD stage IIIb and osteoarthritis, who presented to the emergency room with acute onset of right foot numbness on her bed around midnight but was able to get off of her bed.  She went to sleep at 10 PM.  When she woke up at 6 AM she felt numbness in her right face as well as right arm, right leg and foot.  Her symptoms have been improving some since 6 AM but not resolved.  She  denies any muscle weakness, dysarthria or dysphagia.  No tinnitus or vertigo.  She denied any ataxia and no diplopia.  No urinary or stool incontinence.  No chest pain or palpitations.  She has chronic neck and right shoulder pain.  No fever or chills.  She was seen in the ER for neck and shoulder pain on Saturday 2/18 and placed on steroids and muscle relaxants.  She was also seen on 2/16 for elevated BP that was managed.  ED Course: When she came to the ER, blood pressure was 216/89 with heart rate of 57 with otherwise normal vital signs.  BP later on was 141/69.  Labs revealed a creatinine of 1.2 with otherwise unremarkable CMP.  CBC was within normal. EKG as reviewed by me : Showed normal sinus rhythm with rate of 80 with right bundle branch block and left intrafascicular block (bifascicular block). Imaging: Noncontrast CT scan showed no acute intracranial normalities.  It showed atrophy and chronic microvascular disease.  Brain MRI without contrast revealed small acute infarction of the dorsal left pons with no hemorrhage or mass effect.  It showed findings of chronic small vessel ischemic disease and generalized volume loss.  The patient was given 1 mg of IV Ativan.  She will be admitted to a  medical telemetry bed for further evaluation and management."   Patient was seen by neurology for stroke work-up including MRA, carotid Doppler, echocardiogram, lipid panel, A1c.Marland Kitchen She was started on dual antiplatelet therapy aspirin and Plavix for 3 weeks then to continue aspirin only.  She was seen by PT OT and recommended for home health therapy.  Message sent to cardiology to arrange for 30-day event monitor for A-fib on discharge.  Follow-up with neurology."    OTHER MEDICAL CONDITIONS: Hypertension, hyperlipidemia, CAD status post stent, obstructive sleep apnea on CPAP, CKD   REVIEW OF SYSTEMS: Full 14 system review of systems performed and negative with exception of: As noted in the  HPI  ALLERGIES: Allergies  Allergen Reactions   Atorvastatin Nausea And Vomiting   Contrast Media [Iodinated Contrast Media] Other (See Comments)    Nausea/Vomiting and Shortness of Breath   Darvon [Propoxyphene] Nausea And Vomiting   Codeine Nausea And Vomiting and Anxiety    HOME MEDICATIONS: Outpatient Medications Prior to Visit  Medication Sig Dispense Refill   acetaminophen (TYLENOL) 500 MG tablet Take 2 tablets (1,000 mg total) by mouth every 6 (six) hours as needed for mild pain or moderate pain.     aspirin EC 81 MG tablet Take 1 tablet (81 mg total) by mouth daily. Swallow whole.     ergocalciferol (VITAMIN D2) 1.25 MG (50000 UT) capsule Take 50,000 Units by mouth every 30 (thirty) days.     hydrALAZINE (APRESOLINE) 25 MG tablet Take 1 tablet (25 mg total) by mouth in the morning and at bedtime. 180 tablet 3   metoprolol succinate (TOPROL XL) 25 MG 24 hr tablet Take 0.5 tablets (12.5 mg total) by mouth daily. 15 tablet 5   nitroGLYCERIN (NITROSTAT) 0.4 MG SL tablet PLACE 1 TABLET UNDER THE TONGUE EVERY 5 MINUTES AS NEEDED. (Patient taking differently: Place 0.4 mg under the tongue every 5 (five) minutes as needed for chest pain.) 25 tablet 3   pantoprazole (PROTONIX) 40 MG tablet TAKE 1 TABLET BY MOUTH EVERY DAY (Patient taking differently: Take 40 mg by mouth daily.) 90 tablet 0   potassium chloride (KLOR-CON) 20 MEQ packet Take 20 mEq by mouth daily. 30 packet 0   Rivaroxaban (XARELTO) 15 MG TABS tablet Take 1 tablet (15 mg total) by mouth daily with supper. 30 tablet 6   rosuvastatin (CRESTOR) 40 MG tablet TAKE 1 TABLET BY MOUTH EVERY DAY (Patient taking differently: Take 40 mg by mouth daily.) 90 tablet 1   torsemide (DEMADEX) 20 MG tablet Take 1 tablet (20 mg total) by mouth daily. 30 tablet 0   No facility-administered medications prior to visit.    PAST MEDICAL HISTORY: Past Medical History:  Diagnosis Date   Arthritis    CKD (chronic kidney disease), stage III (HCC)     Complete heart block (HCC) 11/16/2016   a. transient during NSTEMI, resolved with revascularization.   Coronary artery disease 11/2009   a. with prior stent placement to the ramus intermedius and RCA. b. NSTEMI 11/2016 s/p DES to DES to distal Cx with moderate residual dz.   CVA (cerebral vascular accident) [facility])    Essential hypertension    Mixed hyperlipidemia    Non-ST elevation (NSTEMI) myocardial infarction (HCC) 11/16/2016   Obesity    Osteoarthritis    Reflux esophagitis    Sleep apnea 09/27/2010   Untreated, REM 64.7/hr AHI 18.5/hr RDI 19.2/hr. Patuent refused CPAP therapy    PAST SURGICAL HISTORY: Past Surgical History:  Procedure Laterality Date  CHOLECYSTECTOMY     CORONARY ANGIOPLASTY WITH STENT PLACEMENT  2007   A 3.0x9mm CYPHER stent post dilated to 3.29 mm the 100% occlusion was reduced to 0%   CORONARY STENT INTERVENTION N/A 11/16/2016   Procedure: Coronary Stent Intervention;  Surgeon: Tonny Bollman, MD;  Location: [facility] INVASIVE CV LAB;  Service: Cardiovascular;  Laterality: N/A;   LEFT HEART CATH AND CORONARY ANGIOGRAPHY N/A 11/16/2016   Procedure: Left Heart Cath and Coronary Angiography;  Surgeon: Tonny Bollman, MD;  Location: [facility] INVASIVE CV LAB;  Service: Cardiovascular;  Laterality: N/A;   LEFT HEART CATHETERIZATION WITH CORONARY ANGIOGRAM N/A 07/12/2014   Procedure: LEFT HEART CATHETERIZATION WITH CORONARY ANGIOGRAM;  Surgeon: Micheline Chapman, MD;  Location: [facility] CATH LAB;  Service: Cardiovascular;  Laterality: N/A;   TEMPORARY PACEMAKER N/A 11/16/2016   Procedure: Temporary Pacemaker;  Surgeon: Tonny Bollman, MD;  Location: [facility] INVASIVE CV LAB;  Service: Cardiovascular;  Laterality: N/A;    FAMILY HISTORY: Family History  Problem Relation Age of Onset   Hypertension Mother    Aneurysm Mother        died at age 30   Other Father        unsure of medical history   Hypertension Other     SOCIAL HISTORY: Social History   Socioeconomic History   Marital  status: Single    Spouse name: Not on file   Number of children: 9   Years of education: 10th grade   Highest education level: 9th grade  Occupational History   Occupation: Retired  Tobacco Use   Smoking status: Never   Smokeless tobacco: Never  Vaping Use   Vaping status: Never Used  Substance and Sexual Activity   Alcohol use: No   Drug use: No   Sexual activity: Never  Other Topics Concern   Not on file  Social History Narrative   Lives alone (children/grandchildren come by daily - sometimes spend the night).   Right-handed.   Nine children (8 living).   Social Determinants of Health   Financial Resource Strain: Low Risk  (07/03/2023)   Received [facility]   Overall Financial Resource Strain (CARDIA)    Difficulty of Paying Living Expenses: Not hard at all  Food Insecurity: No Food Insecurity (06/10/2023)   Hunger Vital Sign    Worried About Running Out of Food in the Last Year: Never true    Ran Out of Food in the Last Year: Never true  Transportation Needs: No Transportation Needs (06/10/2023)   PRAPARE - Administrator, Civil Service (Medical): No    Lack of Transportation (Non-Medical): No  Physical Activity: Unknown (01/27/2023)   Exercise Vital Sign    Days of Exercise per Week: 1 day    Minutes of Exercise per Session: Not on file  Stress: No Stress Concern Present (01/27/2023)   [employer] of Occupational Health - Occupational Stress Questionnaire    Feeling of Stress : Only a little  Social Connections: Unknown (01/27/2023)   Social Connection and Isolation Panel [NHANES]    Frequency of Communication with Friends and Family: More than three times a week    Frequency of Social Gatherings with Friends and Family: Three times a week    Attends Religious Services: 1 to 4 times per year    Active Member of Clubs or Organizations: Not on file    Attends Banker Meetings: Not on file    Marital Status: Never married   Intimate Partner  Violence: Not At Risk (06/10/2023)   Humiliation, Afraid, Rape, and Kick questionnaire    Fear of Current or Ex-Partner: No    Emotionally Abused: No    Physically Abused: No    Sexually Abused: No    PHYSICAL EXAM  GENERAL EXAM/CONSTITUTIONAL: Vitals:  Vitals:   07/09/23 1604  BP: 130/74  Pulse: 75  Resp: 16  Height: 5\' 5"  (1.651 m)   Body mass index is 33.2 kg/m. Wt Readings from Last 3 Encounters:  06/21/23 199 lb 8.3 oz (90.5 kg)  06/14/23 199 lb 8.3 oz (90.5 kg)  06/12/23 194 lb 10.7 oz (88.3 kg)   Patient is in no distress; well developed, nourished and groomed; neck is supple  EYES: Visual fields full to confrontation, Extraocular movements intacts,   MUSCULOSKELETAL: Gait, strength, tone, movements noted in Neurologic exam below  NEUROLOGIC: MENTAL STATUS:      No data to display         Somnolent, decrease in attention and concentration  Will open eyes but goes right back to sleep  Not answering questions, not following commands.     DIAGNOSTIC DATA (LABS, IMAGING, TESTING) - I reviewed patient records, labs, notes, testing and imaging myself where available.  Lab Results  Component Value Date   WBC 8.6 06/21/2023   HGB 11.3 (L) 06/21/2023   HCT 34.8 (L) 06/21/2023   MCV 89.2 06/21/2023   PLT 311 06/21/2023      Component Value Date/Time   NA 133 (L) 06/21/2023 1037   NA 139 01/19/2015 1048   K 4.2 06/21/2023 1037   CL 98 06/21/2023 1037   CO2 25 06/21/2023 1037   GLUCOSE 88 06/21/2023 1037   BUN 29 (H) 06/21/2023 1037   BUN 14 01/19/2015 1048   CREATININE 2.07 (H) 06/21/2023 1037   CREATININE 0.96 03/02/2014 1057   CALCIUM 8.6 (L) 06/21/2023 1037   PROT 6.1 (L) 06/21/2023 1037   PROT 6.7 01/19/2015 1048   ALBUMIN 2.0 (L) 06/21/2023 1037   ALBUMIN 3.8 01/19/2015 1048   AST 34 06/21/2023 1037   ALT 17 06/21/2023 1037   ALKPHOS 60 06/21/2023 1037   BILITOT 0.5 06/21/2023 1037   BILITOT 0.3 01/19/2015 1048    GFRNONAA 23 (L) 06/21/2023 1037   GFRAA 37 (L) 06/05/2020 2049   Lab Results  Component Value Date   CHOL 103 08/21/2022   HDL 41 08/21/2022   LDLCALC 50 08/21/2022   TRIG 58 08/21/2022   CHOLHDL 2.5 08/21/2022   Lab Results  Component Value Date   HGBA1C 6.0 (H) 08/20/2022   Lab Results  Component Value Date   VITAMINB12 433 03/25/2023   Lab Results  Component Value Date   TSH 1.437 08/28/2015    MRI 11/26/2021: 1. Decreased conspicuity of a subacute left pontine infarct. 2. No evidence of interval/new acute abnormality. 3. Chronic microvascular ischemic disease and atrophy.   MRA head 11/26/2021 : No large vessel occlusion or proximal hemodynamically significant stenosis.   MRA neck 11/26/2021: Unfortunately, the aorta, great vessel origins and proximal common carotid arteries and vertebral arteries were not imaged. Within this limitation and patient motion, the visible common carotid arteries, internal carotid arteries and vertebral arteries are patent without significant (greater than 50%) stenosis  MRI/MRA 08/20/2022 1. Multifocal punctate acute ischemia in the left hemisphere, within multiple vascular territories. The greatest number are located in the medial left temporal lobe. Distribution is most consistent with embolic disease from the left carotid or distal aortic arch. 2.  Normal intracranial MRA   ASSESSMENT AND PLAN 89 y.o. year old female with vascular risk factors including hypertension, hyperlipidemia, coronary artery disease status post 3 stents who is presenting for follow-up.  Her loop recorder captured atrial fibrillation, patient is currently on Xarelto and Plavix.  Unfortunately she was recently admitted to the hospital for E. coli multidrug-resistant UTI and sepsis.  She was discharged yesterday, still on antibiotic but today she is acutely encephalopathic.  She is sleepy, will open eyes but does not respond to questions or follow directions.  I have  informed daughter that this picture is consistent with metabolic encephalopathy from her UTI.  Once the infection clears up, her level of alertness should improve.  Advised them to continue current medications, finish antibiotic course, continue with Xarelto and aspirin I will see him in 6 months or sooner if worse.  They voiced understanding.   1. Metabolic encephalopathy   2. Cerebrovascular accident (CVA) due to embolism of left middle cerebral artery (HCC)   3. Urinary tract infection without hematuria, site unspecified     Patient Instructions  Continue current medications including Xarelto and Aspirin  Follow up with PCP  Return in 6 months or sooner if worse   No orders of the defined types were placed in this encounter.   No orders of the defined types were placed in this encounter.   Return in about 6 months (around 01/07/2024).  I have spent a total of 35 minutes dedicated to this patient today, preparing to see patient, performing a medically appropriate examination and evaluation, ordering tests and/or medications and procedures, and counseling and educating the patient/family/caregiver; independently interpreting result and communicating results to the family/patient/caregiver; and documenting clinical information in the electronic medical record.    Windell Norfolk, MD 07/09/2023, 6:01 PM  [facility] [address], [address] [place], [place] [address] [phone]

## 2023-07-09 NOTE — Patient Instructions (Signed)
Continue current medications including Xarelto and Aspirin  Follow up with PCP  Return in 6 months or sooner if worse

## 2023-07-11 NOTE — Telephone Encounter (Signed)
Please see previous TOC request. I do not see that he was ever seen. Can we call and figure out what's going on please?

## 2023-07-12 ENCOUNTER — Telehealth: Payer: Self-pay | Admitting: Cardiology

## 2023-07-12 NOTE — Telephone Encounter (Signed)
Message sent to scheduling 

## 2023-07-12 NOTE — Telephone Encounter (Signed)
LVMTCB for TOC appt

## 2023-07-15 ENCOUNTER — Telehealth: Payer: Self-pay | Admitting: Internal Medicine

## 2023-07-15 NOTE — Telephone Encounter (Signed)
FMLA (Son [name])  Noted  Copied Sleeved  Original in PCP box Copy front desk folder

## 2023-07-17 DIAGNOSIS — N179 Acute kidney failure, unspecified: Secondary | ICD-10-CM | POA: Diagnosis not present

## 2023-07-17 DIAGNOSIS — Z7901 Long term (current) use of anticoagulants: Secondary | ICD-10-CM | POA: Diagnosis not present

## 2023-07-17 DIAGNOSIS — I6782 Cerebral ischemia: Secondary | ICD-10-CM | POA: Diagnosis not present

## 2023-07-17 DIAGNOSIS — Z743 Need for continuous supervision: Secondary | ICD-10-CM | POA: Diagnosis not present

## 2023-07-17 DIAGNOSIS — R404 Transient alteration of awareness: Secondary | ICD-10-CM | POA: Diagnosis not present

## 2023-07-17 DIAGNOSIS — N390 Urinary tract infection, site not specified: Secondary | ICD-10-CM | POA: Diagnosis not present

## 2023-07-17 DIAGNOSIS — R0902 Hypoxemia: Secondary | ICD-10-CM | POA: Diagnosis not present

## 2023-07-17 DIAGNOSIS — I130 Hypertensive heart and chronic kidney disease with heart failure and stage 1 through stage 4 chronic kidney disease, or unspecified chronic kidney disease: Secondary | ICD-10-CM | POA: Diagnosis not present

## 2023-07-17 DIAGNOSIS — I509 Heart failure, unspecified: Secondary | ICD-10-CM | POA: Diagnosis not present

## 2023-07-17 DIAGNOSIS — N189 Chronic kidney disease, unspecified: Secondary | ICD-10-CM | POA: Diagnosis not present

## 2023-07-17 DIAGNOSIS — L8995 Pressure ulcer of unspecified site, unstageable: Secondary | ICD-10-CM | POA: Diagnosis not present

## 2023-07-17 DIAGNOSIS — R4182 Altered mental status, unspecified: Secondary | ICD-10-CM | POA: Diagnosis not present

## 2023-07-17 DIAGNOSIS — Z79899 Other long term (current) drug therapy: Secondary | ICD-10-CM | POA: Diagnosis not present

## 2023-07-17 DIAGNOSIS — N183 Chronic kidney disease, stage 3 unspecified: Secondary | ICD-10-CM | POA: Diagnosis not present

## 2023-07-17 DIAGNOSIS — E870 Hyperosmolality and hypernatremia: Secondary | ICD-10-CM | POA: Diagnosis not present

## 2023-07-17 DIAGNOSIS — N3000 Acute cystitis without hematuria: Secondary | ICD-10-CM | POA: Diagnosis not present

## 2023-07-17 DIAGNOSIS — I4891 Unspecified atrial fibrillation: Secondary | ICD-10-CM | POA: Diagnosis not present

## 2023-07-18 DIAGNOSIS — R1310 Dysphagia, unspecified: Secondary | ICD-10-CM | POA: Diagnosis not present

## 2023-07-18 DIAGNOSIS — E870 Hyperosmolality and hypernatremia: Secondary | ICD-10-CM | POA: Diagnosis not present

## 2023-07-18 DIAGNOSIS — N390 Urinary tract infection, site not specified: Secondary | ICD-10-CM | POA: Diagnosis not present

## 2023-07-18 DIAGNOSIS — G9341 Metabolic encephalopathy: Secondary | ICD-10-CM | POA: Diagnosis not present

## 2023-07-18 DIAGNOSIS — R4182 Altered mental status, unspecified: Secondary | ICD-10-CM | POA: Diagnosis not present

## 2023-07-18 DIAGNOSIS — I480 Paroxysmal atrial fibrillation: Secondary | ICD-10-CM | POA: Diagnosis not present

## 2023-07-18 DIAGNOSIS — N183 Chronic kidney disease, stage 3 unspecified: Secondary | ICD-10-CM | POA: Diagnosis not present

## 2023-07-18 DIAGNOSIS — N179 Acute kidney failure, unspecified: Secondary | ICD-10-CM | POA: Diagnosis not present

## 2023-07-18 DIAGNOSIS — R54 Age-related physical debility: Secondary | ICD-10-CM | POA: Diagnosis not present

## 2023-07-18 DIAGNOSIS — Z79899 Other long term (current) drug therapy: Secondary | ICD-10-CM | POA: Diagnosis not present

## 2023-07-18 DIAGNOSIS — I5032 Chronic diastolic (congestive) heart failure: Secondary | ICD-10-CM | POA: Diagnosis not present

## 2023-07-19 DIAGNOSIS — I480 Paroxysmal atrial fibrillation: Secondary | ICD-10-CM | POA: Diagnosis not present

## 2023-07-19 DIAGNOSIS — Z7901 Long term (current) use of anticoagulants: Secondary | ICD-10-CM | POA: Diagnosis not present

## 2023-07-19 DIAGNOSIS — N390 Urinary tract infection, site not specified: Secondary | ICD-10-CM | POA: Diagnosis not present

## 2023-07-19 DIAGNOSIS — I5032 Chronic diastolic (congestive) heart failure: Secondary | ICD-10-CM | POA: Diagnosis not present

## 2023-07-19 DIAGNOSIS — R9089 Other abnormal findings on diagnostic imaging of central nervous system: Secondary | ICD-10-CM | POA: Diagnosis not present

## 2023-07-19 DIAGNOSIS — N183 Chronic kidney disease, stage 3 unspecified: Secondary | ICD-10-CM | POA: Diagnosis not present

## 2023-07-19 DIAGNOSIS — E876 Hypokalemia: Secondary | ICD-10-CM | POA: Diagnosis not present

## 2023-07-19 DIAGNOSIS — R54 Age-related physical debility: Secondary | ICD-10-CM | POA: Diagnosis not present

## 2023-07-19 DIAGNOSIS — E870 Hyperosmolality and hypernatremia: Secondary | ICD-10-CM | POA: Diagnosis not present

## 2023-07-19 DIAGNOSIS — K5900 Constipation, unspecified: Secondary | ICD-10-CM | POA: Diagnosis not present

## 2023-07-19 DIAGNOSIS — R1310 Dysphagia, unspecified: Secondary | ICD-10-CM | POA: Diagnosis not present

## 2023-07-19 DIAGNOSIS — G9341 Metabolic encephalopathy: Secondary | ICD-10-CM | POA: Diagnosis not present

## 2023-07-19 DIAGNOSIS — R4182 Altered mental status, unspecified: Secondary | ICD-10-CM | POA: Diagnosis not present

## 2023-07-19 DIAGNOSIS — R531 Weakness: Secondary | ICD-10-CM | POA: Diagnosis not present

## 2023-07-19 DIAGNOSIS — I6782 Cerebral ischemia: Secondary | ICD-10-CM | POA: Diagnosis not present

## 2023-07-20 DIAGNOSIS — N179 Acute kidney failure, unspecified: Secondary | ICD-10-CM | POA: Diagnosis not present

## 2023-07-20 DIAGNOSIS — K5900 Constipation, unspecified: Secondary | ICD-10-CM | POA: Diagnosis not present

## 2023-07-20 DIAGNOSIS — R54 Age-related physical debility: Secondary | ICD-10-CM | POA: Diagnosis not present

## 2023-07-20 DIAGNOSIS — R412 Retrograde amnesia: Secondary | ICD-10-CM | POA: Diagnosis not present

## 2023-07-20 DIAGNOSIS — E870 Hyperosmolality and hypernatremia: Secondary | ICD-10-CM | POA: Diagnosis not present

## 2023-07-20 DIAGNOSIS — E876 Hypokalemia: Secondary | ICD-10-CM | POA: Diagnosis not present

## 2023-07-20 DIAGNOSIS — N183 Chronic kidney disease, stage 3 unspecified: Secondary | ICD-10-CM | POA: Diagnosis not present

## 2023-07-20 DIAGNOSIS — R1310 Dysphagia, unspecified: Secondary | ICD-10-CM | POA: Diagnosis not present

## 2023-07-20 DIAGNOSIS — G9341 Metabolic encephalopathy: Secondary | ICD-10-CM | POA: Diagnosis not present

## 2023-07-20 DIAGNOSIS — I480 Paroxysmal atrial fibrillation: Secondary | ICD-10-CM | POA: Diagnosis not present

## 2023-07-20 DIAGNOSIS — I5032 Chronic diastolic (congestive) heart failure: Secondary | ICD-10-CM | POA: Diagnosis not present

## 2023-07-20 DIAGNOSIS — N390 Urinary tract infection, site not specified: Secondary | ICD-10-CM | POA: Diagnosis not present

## 2023-07-21 DIAGNOSIS — N179 Acute kidney failure, unspecified: Secondary | ICD-10-CM | POA: Diagnosis not present

## 2023-07-21 DIAGNOSIS — R4182 Altered mental status, unspecified: Secondary | ICD-10-CM | POA: Diagnosis not present

## 2023-07-21 DIAGNOSIS — G9341 Metabolic encephalopathy: Secondary | ICD-10-CM | POA: Diagnosis not present

## 2023-07-21 DIAGNOSIS — E870 Hyperosmolality and hypernatremia: Secondary | ICD-10-CM | POA: Diagnosis not present

## 2023-07-21 DIAGNOSIS — E876 Hypokalemia: Secondary | ICD-10-CM | POA: Diagnosis not present

## 2023-07-21 DIAGNOSIS — I480 Paroxysmal atrial fibrillation: Secondary | ICD-10-CM | POA: Diagnosis not present

## 2023-07-21 DIAGNOSIS — R54 Age-related physical debility: Secondary | ICD-10-CM | POA: Diagnosis not present

## 2023-07-21 DIAGNOSIS — N390 Urinary tract infection, site not specified: Secondary | ICD-10-CM | POA: Diagnosis not present

## 2023-07-21 DIAGNOSIS — R1310 Dysphagia, unspecified: Secondary | ICD-10-CM | POA: Diagnosis not present

## 2023-07-21 DIAGNOSIS — N183 Chronic kidney disease, stage 3 unspecified: Secondary | ICD-10-CM | POA: Diagnosis not present

## 2023-07-21 DIAGNOSIS — I5032 Chronic diastolic (congestive) heart failure: Secondary | ICD-10-CM | POA: Diagnosis not present

## 2023-07-21 DIAGNOSIS — K5900 Constipation, unspecified: Secondary | ICD-10-CM | POA: Diagnosis not present

## 2023-07-22 DIAGNOSIS — E876 Hypokalemia: Secondary | ICD-10-CM | POA: Diagnosis not present

## 2023-07-22 DIAGNOSIS — R54 Age-related physical debility: Secondary | ICD-10-CM | POA: Diagnosis not present

## 2023-07-22 DIAGNOSIS — I5032 Chronic diastolic (congestive) heart failure: Secondary | ICD-10-CM | POA: Diagnosis not present

## 2023-07-22 DIAGNOSIS — R1310 Dysphagia, unspecified: Secondary | ICD-10-CM | POA: Diagnosis not present

## 2023-07-22 DIAGNOSIS — N183 Chronic kidney disease, stage 3 unspecified: Secondary | ICD-10-CM | POA: Diagnosis not present

## 2023-07-22 DIAGNOSIS — N179 Acute kidney failure, unspecified: Secondary | ICD-10-CM | POA: Diagnosis not present

## 2023-07-22 DIAGNOSIS — R4182 Altered mental status, unspecified: Secondary | ICD-10-CM | POA: Diagnosis not present

## 2023-07-22 DIAGNOSIS — G9341 Metabolic encephalopathy: Secondary | ICD-10-CM | POA: Diagnosis not present

## 2023-07-22 DIAGNOSIS — K5900 Constipation, unspecified: Secondary | ICD-10-CM | POA: Diagnosis not present

## 2023-07-22 DIAGNOSIS — N390 Urinary tract infection, site not specified: Secondary | ICD-10-CM | POA: Diagnosis not present

## 2023-07-22 DIAGNOSIS — E870 Hyperosmolality and hypernatremia: Secondary | ICD-10-CM | POA: Diagnosis not present

## 2023-07-23 DIAGNOSIS — R54 Age-related physical debility: Secondary | ICD-10-CM | POA: Diagnosis not present

## 2023-07-23 DIAGNOSIS — N183 Chronic kidney disease, stage 3 unspecified: Secondary | ICD-10-CM | POA: Diagnosis not present

## 2023-07-23 DIAGNOSIS — E870 Hyperosmolality and hypernatremia: Secondary | ICD-10-CM | POA: Diagnosis not present

## 2023-07-23 DIAGNOSIS — K5900 Constipation, unspecified: Secondary | ICD-10-CM | POA: Diagnosis not present

## 2023-07-23 DIAGNOSIS — I480 Paroxysmal atrial fibrillation: Secondary | ICD-10-CM | POA: Diagnosis not present

## 2023-07-23 DIAGNOSIS — N179 Acute kidney failure, unspecified: Secondary | ICD-10-CM | POA: Diagnosis not present

## 2023-07-23 DIAGNOSIS — N390 Urinary tract infection, site not specified: Secondary | ICD-10-CM | POA: Diagnosis not present

## 2023-07-23 DIAGNOSIS — R1310 Dysphagia, unspecified: Secondary | ICD-10-CM | POA: Diagnosis not present

## 2023-07-23 DIAGNOSIS — I5032 Chronic diastolic (congestive) heart failure: Secondary | ICD-10-CM | POA: Diagnosis not present

## 2023-07-23 DIAGNOSIS — G9341 Metabolic encephalopathy: Secondary | ICD-10-CM | POA: Diagnosis not present

## 2023-07-23 DIAGNOSIS — R4182 Altered mental status, unspecified: Secondary | ICD-10-CM | POA: Diagnosis not present

## 2023-07-24 DIAGNOSIS — E876 Hypokalemia: Secondary | ICD-10-CM | POA: Diagnosis not present

## 2023-07-24 DIAGNOSIS — R1310 Dysphagia, unspecified: Secondary | ICD-10-CM | POA: Diagnosis not present

## 2023-07-24 DIAGNOSIS — E870 Hyperosmolality and hypernatremia: Secondary | ICD-10-CM | POA: Diagnosis not present

## 2023-07-24 DIAGNOSIS — N183 Chronic kidney disease, stage 3 unspecified: Secondary | ICD-10-CM | POA: Diagnosis not present

## 2023-07-24 DIAGNOSIS — I5032 Chronic diastolic (congestive) heart failure: Secondary | ICD-10-CM | POA: Diagnosis not present

## 2023-07-24 DIAGNOSIS — R918 Other nonspecific abnormal finding of lung field: Secondary | ICD-10-CM | POA: Diagnosis not present

## 2023-07-24 DIAGNOSIS — I700 Atherosclerosis of aorta: Secondary | ICD-10-CM | POA: Diagnosis not present

## 2023-07-24 DIAGNOSIS — K5900 Constipation, unspecified: Secondary | ICD-10-CM | POA: Diagnosis not present

## 2023-07-24 DIAGNOSIS — I480 Paroxysmal atrial fibrillation: Secondary | ICD-10-CM | POA: Diagnosis not present

## 2023-07-24 DIAGNOSIS — R4182 Altered mental status, unspecified: Secondary | ICD-10-CM | POA: Diagnosis not present

## 2023-07-24 DIAGNOSIS — R059 Cough, unspecified: Secondary | ICD-10-CM | POA: Diagnosis not present

## 2023-07-24 DIAGNOSIS — N179 Acute kidney failure, unspecified: Secondary | ICD-10-CM | POA: Diagnosis not present

## 2023-07-24 DIAGNOSIS — R54 Age-related physical debility: Secondary | ICD-10-CM | POA: Diagnosis not present

## 2023-07-24 DIAGNOSIS — G9341 Metabolic encephalopathy: Secondary | ICD-10-CM | POA: Diagnosis not present

## 2023-07-25 DIAGNOSIS — I5032 Chronic diastolic (congestive) heart failure: Secondary | ICD-10-CM | POA: Diagnosis not present

## 2023-07-25 DIAGNOSIS — E870 Hyperosmolality and hypernatremia: Secondary | ICD-10-CM | POA: Diagnosis not present

## 2023-07-25 DIAGNOSIS — R1310 Dysphagia, unspecified: Secondary | ICD-10-CM | POA: Diagnosis not present

## 2023-07-25 DIAGNOSIS — K5900 Constipation, unspecified: Secondary | ICD-10-CM | POA: Diagnosis not present

## 2023-07-25 DIAGNOSIS — N179 Acute kidney failure, unspecified: Secondary | ICD-10-CM | POA: Diagnosis not present

## 2023-07-25 DIAGNOSIS — R54 Age-related physical debility: Secondary | ICD-10-CM | POA: Diagnosis not present

## 2023-07-25 DIAGNOSIS — R4182 Altered mental status, unspecified: Secondary | ICD-10-CM | POA: Diagnosis not present

## 2023-07-25 DIAGNOSIS — N183 Chronic kidney disease, stage 3 unspecified: Secondary | ICD-10-CM | POA: Diagnosis not present

## 2023-07-25 DIAGNOSIS — I480 Paroxysmal atrial fibrillation: Secondary | ICD-10-CM | POA: Diagnosis not present

## 2023-07-25 DIAGNOSIS — L89159 Pressure ulcer of sacral region, unspecified stage: Secondary | ICD-10-CM | POA: Diagnosis not present

## 2023-07-25 DIAGNOSIS — G9341 Metabolic encephalopathy: Secondary | ICD-10-CM | POA: Diagnosis not present

## 2023-07-25 DIAGNOSIS — I130 Hypertensive heart and chronic kidney disease with heart failure and stage 1 through stage 4 chronic kidney disease, or unspecified chronic kidney disease: Secondary | ICD-10-CM | POA: Diagnosis not present

## 2023-07-26 ENCOUNTER — Telehealth: Payer: Self-pay | Admitting: Internal Medicine

## 2023-07-26 ENCOUNTER — Telehealth: Payer: Self-pay

## 2023-07-26 DIAGNOSIS — N179 Acute kidney failure, unspecified: Secondary | ICD-10-CM | POA: Diagnosis not present

## 2023-07-26 DIAGNOSIS — E870 Hyperosmolality and hypernatremia: Secondary | ICD-10-CM | POA: Diagnosis not present

## 2023-07-26 DIAGNOSIS — I480 Paroxysmal atrial fibrillation: Secondary | ICD-10-CM | POA: Diagnosis not present

## 2023-07-26 DIAGNOSIS — G9341 Metabolic encephalopathy: Secondary | ICD-10-CM | POA: Diagnosis not present

## 2023-07-26 DIAGNOSIS — R4182 Altered mental status, unspecified: Secondary | ICD-10-CM | POA: Diagnosis not present

## 2023-07-26 DIAGNOSIS — I5032 Chronic diastolic (congestive) heart failure: Secondary | ICD-10-CM | POA: Diagnosis not present

## 2023-07-26 DIAGNOSIS — N183 Chronic kidney disease, stage 3 unspecified: Secondary | ICD-10-CM | POA: Diagnosis not present

## 2023-07-26 DIAGNOSIS — K5900 Constipation, unspecified: Secondary | ICD-10-CM | POA: Diagnosis not present

## 2023-07-26 DIAGNOSIS — I10 Essential (primary) hypertension: Secondary | ICD-10-CM | POA: Diagnosis not present

## 2023-07-26 DIAGNOSIS — R54 Age-related physical debility: Secondary | ICD-10-CM | POA: Diagnosis not present

## 2023-07-26 DIAGNOSIS — R1310 Dysphagia, unspecified: Secondary | ICD-10-CM | POA: Diagnosis not present

## 2023-07-26 NOTE — Telephone Encounter (Signed)
Copied from CRM [id number]. Topic: Clinical - Medical Advice >> Jul 26, 2023  8:18 AM Almira Coaster wrote: Reason for CRM: Patient's son called because the patient is currently admitted in [facility], They recommended placing the patient in hospice; however, the family would like to discuss this with Dr.Dixon prior to making any decision. He is expecting a call back.

## 2023-07-26 NOTE — Telephone Encounter (Signed)
Copied from CRM [id number]. Topic: General - Other >> Jul 26, 2023 11:45 AM Theodis Sato wrote: Tanja Port (NP) from [facility] needs to speak with Dr. Durwin Nora regarding PT having been admitted at [facility] for a week now but her family would like her transferred to [facility] and would like Dr. Durwin Nora to know her status. Please contact Tanja Port at [phone]

## 2023-07-27 DIAGNOSIS — E46 Unspecified protein-calorie malnutrition: Secondary | ICD-10-CM | POA: Diagnosis not present

## 2023-07-27 DIAGNOSIS — R54 Age-related physical debility: Secondary | ICD-10-CM | POA: Diagnosis not present

## 2023-07-27 DIAGNOSIS — N390 Urinary tract infection, site not specified: Secondary | ICD-10-CM | POA: Diagnosis not present

## 2023-07-27 DIAGNOSIS — G9341 Metabolic encephalopathy: Secondary | ICD-10-CM | POA: Diagnosis not present

## 2023-07-27 DIAGNOSIS — E870 Hyperosmolality and hypernatremia: Secondary | ICD-10-CM | POA: Diagnosis not present

## 2023-07-27 DIAGNOSIS — I5032 Chronic diastolic (congestive) heart failure: Secondary | ICD-10-CM | POA: Diagnosis not present

## 2023-07-27 DIAGNOSIS — R1310 Dysphagia, unspecified: Secondary | ICD-10-CM | POA: Diagnosis not present

## 2023-07-27 DIAGNOSIS — K5900 Constipation, unspecified: Secondary | ICD-10-CM | POA: Diagnosis not present

## 2023-07-27 DIAGNOSIS — E43 Unspecified severe protein-calorie malnutrition: Secondary | ICD-10-CM | POA: Diagnosis not present

## 2023-07-27 DIAGNOSIS — N183 Chronic kidney disease, stage 3 unspecified: Secondary | ICD-10-CM | POA: Diagnosis not present

## 2023-07-27 DIAGNOSIS — N179 Acute kidney failure, unspecified: Secondary | ICD-10-CM | POA: Diagnosis not present

## 2023-07-27 DIAGNOSIS — R4182 Altered mental status, unspecified: Secondary | ICD-10-CM | POA: Diagnosis not present

## 2023-07-28 DIAGNOSIS — R4182 Altered mental status, unspecified: Secondary | ICD-10-CM | POA: Diagnosis not present

## 2023-07-28 DIAGNOSIS — I5032 Chronic diastolic (congestive) heart failure: Secondary | ICD-10-CM | POA: Diagnosis not present

## 2023-07-28 DIAGNOSIS — K5900 Constipation, unspecified: Secondary | ICD-10-CM | POA: Diagnosis not present

## 2023-07-28 DIAGNOSIS — N183 Chronic kidney disease, stage 3 unspecified: Secondary | ICD-10-CM | POA: Diagnosis not present

## 2023-07-28 DIAGNOSIS — R54 Age-related physical debility: Secondary | ICD-10-CM | POA: Diagnosis not present

## 2023-07-28 DIAGNOSIS — I808 Phlebitis and thrombophlebitis of other sites: Secondary | ICD-10-CM | POA: Diagnosis not present

## 2023-07-28 DIAGNOSIS — I82612 Acute embolism and thrombosis of superficial veins of left upper extremity: Secondary | ICD-10-CM | POA: Diagnosis not present

## 2023-07-28 DIAGNOSIS — N179 Acute kidney failure, unspecified: Secondary | ICD-10-CM | POA: Diagnosis not present

## 2023-07-28 DIAGNOSIS — E43 Unspecified severe protein-calorie malnutrition: Secondary | ICD-10-CM | POA: Diagnosis not present

## 2023-07-28 DIAGNOSIS — E870 Hyperosmolality and hypernatremia: Secondary | ICD-10-CM | POA: Diagnosis not present

## 2023-07-28 DIAGNOSIS — M7989 Other specified soft tissue disorders: Secondary | ICD-10-CM | POA: Diagnosis not present

## 2023-07-28 DIAGNOSIS — N390 Urinary tract infection, site not specified: Secondary | ICD-10-CM | POA: Diagnosis not present

## 2023-07-28 DIAGNOSIS — G9341 Metabolic encephalopathy: Secondary | ICD-10-CM | POA: Diagnosis not present

## 2023-07-28 DIAGNOSIS — R1310 Dysphagia, unspecified: Secondary | ICD-10-CM | POA: Diagnosis not present

## 2023-07-29 DIAGNOSIS — N390 Urinary tract infection, site not specified: Secondary | ICD-10-CM | POA: Diagnosis not present

## 2023-07-29 DIAGNOSIS — I480 Paroxysmal atrial fibrillation: Secondary | ICD-10-CM | POA: Diagnosis not present

## 2023-07-29 DIAGNOSIS — R1310 Dysphagia, unspecified: Secondary | ICD-10-CM | POA: Diagnosis not present

## 2023-07-29 DIAGNOSIS — G9341 Metabolic encephalopathy: Secondary | ICD-10-CM | POA: Diagnosis not present

## 2023-07-29 DIAGNOSIS — R54 Age-related physical debility: Secondary | ICD-10-CM | POA: Diagnosis not present

## 2023-07-29 DIAGNOSIS — N183 Chronic kidney disease, stage 3 unspecified: Secondary | ICD-10-CM | POA: Diagnosis not present

## 2023-07-29 DIAGNOSIS — R4182 Altered mental status, unspecified: Secondary | ICD-10-CM | POA: Diagnosis not present

## 2023-07-29 DIAGNOSIS — E43 Unspecified severe protein-calorie malnutrition: Secondary | ICD-10-CM | POA: Diagnosis not present

## 2023-07-29 DIAGNOSIS — I5032 Chronic diastolic (congestive) heart failure: Secondary | ICD-10-CM | POA: Diagnosis not present

## 2023-07-29 DIAGNOSIS — E870 Hyperosmolality and hypernatremia: Secondary | ICD-10-CM | POA: Diagnosis not present

## 2023-07-29 DIAGNOSIS — I2510 Atherosclerotic heart disease of native coronary artery without angina pectoris: Secondary | ICD-10-CM | POA: Diagnosis not present

## 2023-07-29 DIAGNOSIS — K5900 Constipation, unspecified: Secondary | ICD-10-CM | POA: Diagnosis not present

## 2023-07-29 NOTE — Progress Notes (Signed)
Carelink Summary Report / Loop Recorder 

## 2023-07-30 DIAGNOSIS — I517 Cardiomegaly: Secondary | ICD-10-CM | POA: Diagnosis not present

## 2023-07-30 DIAGNOSIS — R4182 Altered mental status, unspecified: Secondary | ICD-10-CM | POA: Diagnosis not present

## 2023-07-30 DIAGNOSIS — R059 Cough, unspecified: Secondary | ICD-10-CM | POA: Diagnosis not present

## 2023-07-30 DIAGNOSIS — E870 Hyperosmolality and hypernatremia: Secondary | ICD-10-CM | POA: Diagnosis not present

## 2023-07-30 DIAGNOSIS — G9341 Metabolic encephalopathy: Secondary | ICD-10-CM | POA: Diagnosis not present

## 2023-07-30 DIAGNOSIS — I480 Paroxysmal atrial fibrillation: Secondary | ICD-10-CM | POA: Diagnosis not present

## 2023-07-30 DIAGNOSIS — N390 Urinary tract infection, site not specified: Secondary | ICD-10-CM | POA: Diagnosis not present

## 2023-07-30 DIAGNOSIS — R918 Other nonspecific abnormal finding of lung field: Secondary | ICD-10-CM | POA: Diagnosis not present

## 2023-07-30 DIAGNOSIS — N183 Chronic kidney disease, stage 3 unspecified: Secondary | ICD-10-CM | POA: Diagnosis not present

## 2023-07-30 DIAGNOSIS — R54 Age-related physical debility: Secondary | ICD-10-CM | POA: Diagnosis not present

## 2023-07-30 DIAGNOSIS — R531 Weakness: Secondary | ICD-10-CM | POA: Diagnosis not present

## 2023-07-30 DIAGNOSIS — E43 Unspecified severe protein-calorie malnutrition: Secondary | ICD-10-CM | POA: Diagnosis not present

## 2023-07-30 DIAGNOSIS — R1310 Dysphagia, unspecified: Secondary | ICD-10-CM | POA: Diagnosis not present

## 2023-07-30 DIAGNOSIS — Z931 Gastrostomy status: Secondary | ICD-10-CM | POA: Diagnosis not present

## 2023-07-30 DIAGNOSIS — N179 Acute kidney failure, unspecified: Secondary | ICD-10-CM | POA: Diagnosis not present

## 2023-07-30 DIAGNOSIS — I5032 Chronic diastolic (congestive) heart failure: Secondary | ICD-10-CM | POA: Diagnosis not present

## 2023-07-31 DIAGNOSIS — G9341 Metabolic encephalopathy: Secondary | ICD-10-CM | POA: Diagnosis not present

## 2023-07-31 DIAGNOSIS — R1310 Dysphagia, unspecified: Secondary | ICD-10-CM | POA: Diagnosis not present

## 2023-07-31 DIAGNOSIS — R4182 Altered mental status, unspecified: Secondary | ICD-10-CM | POA: Diagnosis not present

## 2023-07-31 DIAGNOSIS — K5900 Constipation, unspecified: Secondary | ICD-10-CM | POA: Diagnosis not present

## 2023-07-31 DIAGNOSIS — I480 Paroxysmal atrial fibrillation: Secondary | ICD-10-CM | POA: Diagnosis not present

## 2023-07-31 DIAGNOSIS — N183 Chronic kidney disease, stage 3 unspecified: Secondary | ICD-10-CM | POA: Diagnosis not present

## 2023-07-31 DIAGNOSIS — E870 Hyperosmolality and hypernatremia: Secondary | ICD-10-CM | POA: Diagnosis not present

## 2023-07-31 DIAGNOSIS — K449 Diaphragmatic hernia without obstruction or gangrene: Secondary | ICD-10-CM | POA: Diagnosis not present

## 2023-07-31 DIAGNOSIS — K561 Intussusception: Secondary | ICD-10-CM | POA: Diagnosis not present

## 2023-07-31 DIAGNOSIS — N179 Acute kidney failure, unspecified: Secondary | ICD-10-CM | POA: Diagnosis not present

## 2023-07-31 DIAGNOSIS — E43 Unspecified severe protein-calorie malnutrition: Secondary | ICD-10-CM | POA: Diagnosis not present

## 2023-07-31 DIAGNOSIS — Z931 Gastrostomy status: Secondary | ICD-10-CM | POA: Diagnosis not present

## 2023-07-31 DIAGNOSIS — I5032 Chronic diastolic (congestive) heart failure: Secondary | ICD-10-CM | POA: Diagnosis not present

## 2023-07-31 DIAGNOSIS — Z4682 Encounter for fitting and adjustment of non-vascular catheter: Secondary | ICD-10-CM | POA: Diagnosis not present

## 2023-07-31 DIAGNOSIS — R54 Age-related physical debility: Secondary | ICD-10-CM | POA: Diagnosis not present

## 2023-08-01 DIAGNOSIS — E43 Unspecified severe protein-calorie malnutrition: Secondary | ICD-10-CM | POA: Diagnosis not present

## 2023-08-01 DIAGNOSIS — R54 Age-related physical debility: Secondary | ICD-10-CM | POA: Diagnosis not present

## 2023-08-01 DIAGNOSIS — R1310 Dysphagia, unspecified: Secondary | ICD-10-CM | POA: Diagnosis not present

## 2023-08-01 DIAGNOSIS — G9341 Metabolic encephalopathy: Secondary | ICD-10-CM | POA: Diagnosis not present

## 2023-08-01 DIAGNOSIS — I480 Paroxysmal atrial fibrillation: Secondary | ICD-10-CM | POA: Diagnosis not present

## 2023-08-01 DIAGNOSIS — R4182 Altered mental status, unspecified: Secondary | ICD-10-CM | POA: Diagnosis not present

## 2023-08-01 DIAGNOSIS — N179 Acute kidney failure, unspecified: Secondary | ICD-10-CM | POA: Diagnosis not present

## 2023-08-01 DIAGNOSIS — K5900 Constipation, unspecified: Secondary | ICD-10-CM | POA: Diagnosis not present

## 2023-08-01 DIAGNOSIS — N183 Chronic kidney disease, stage 3 unspecified: Secondary | ICD-10-CM | POA: Diagnosis not present

## 2023-08-01 DIAGNOSIS — Z931 Gastrostomy status: Secondary | ICD-10-CM | POA: Diagnosis not present

## 2023-08-01 DIAGNOSIS — E870 Hyperosmolality and hypernatremia: Secondary | ICD-10-CM | POA: Diagnosis not present

## 2023-08-01 DIAGNOSIS — I5032 Chronic diastolic (congestive) heart failure: Secondary | ICD-10-CM | POA: Diagnosis not present

## 2023-08-02 DIAGNOSIS — N179 Acute kidney failure, unspecified: Secondary | ICD-10-CM | POA: Diagnosis not present

## 2023-08-02 DIAGNOSIS — R1310 Dysphagia, unspecified: Secondary | ICD-10-CM | POA: Diagnosis not present

## 2023-08-02 DIAGNOSIS — E870 Hyperosmolality and hypernatremia: Secondary | ICD-10-CM | POA: Diagnosis not present

## 2023-08-02 DIAGNOSIS — E43 Unspecified severe protein-calorie malnutrition: Secondary | ICD-10-CM | POA: Diagnosis not present

## 2023-08-02 DIAGNOSIS — R4182 Altered mental status, unspecified: Secondary | ICD-10-CM | POA: Diagnosis not present

## 2023-08-02 DIAGNOSIS — I5032 Chronic diastolic (congestive) heart failure: Secondary | ICD-10-CM | POA: Diagnosis not present

## 2023-08-02 DIAGNOSIS — G9341 Metabolic encephalopathy: Secondary | ICD-10-CM | POA: Diagnosis not present

## 2023-08-02 DIAGNOSIS — I480 Paroxysmal atrial fibrillation: Secondary | ICD-10-CM | POA: Diagnosis not present

## 2023-08-02 DIAGNOSIS — R54 Age-related physical debility: Secondary | ICD-10-CM | POA: Diagnosis not present

## 2023-08-02 DIAGNOSIS — N183 Chronic kidney disease, stage 3 unspecified: Secondary | ICD-10-CM | POA: Diagnosis not present

## 2023-08-02 DIAGNOSIS — D631 Anemia in chronic kidney disease: Secondary | ICD-10-CM | POA: Diagnosis not present

## 2023-08-03 ENCOUNTER — Other Ambulatory Visit: Payer: Self-pay | Admitting: Nurse Practitioner

## 2023-08-03 DIAGNOSIS — N183 Chronic kidney disease, stage 3 unspecified: Secondary | ICD-10-CM | POA: Diagnosis not present

## 2023-08-03 DIAGNOSIS — E43 Unspecified severe protein-calorie malnutrition: Secondary | ICD-10-CM | POA: Diagnosis not present

## 2023-08-03 DIAGNOSIS — D631 Anemia in chronic kidney disease: Secondary | ICD-10-CM | POA: Diagnosis not present

## 2023-08-03 DIAGNOSIS — E870 Hyperosmolality and hypernatremia: Secondary | ICD-10-CM | POA: Diagnosis not present

## 2023-08-03 DIAGNOSIS — R54 Age-related physical debility: Secondary | ICD-10-CM | POA: Diagnosis not present

## 2023-08-03 DIAGNOSIS — G9341 Metabolic encephalopathy: Secondary | ICD-10-CM | POA: Diagnosis not present

## 2023-08-03 DIAGNOSIS — I480 Paroxysmal atrial fibrillation: Secondary | ICD-10-CM | POA: Diagnosis not present

## 2023-08-03 DIAGNOSIS — I5032 Chronic diastolic (congestive) heart failure: Secondary | ICD-10-CM | POA: Diagnosis not present

## 2023-08-03 DIAGNOSIS — N179 Acute kidney failure, unspecified: Secondary | ICD-10-CM | POA: Diagnosis not present

## 2023-08-03 DIAGNOSIS — R4182 Altered mental status, unspecified: Secondary | ICD-10-CM | POA: Diagnosis not present

## 2023-08-03 DIAGNOSIS — R1310 Dysphagia, unspecified: Secondary | ICD-10-CM | POA: Diagnosis not present

## 2023-08-04 DIAGNOSIS — E43 Unspecified severe protein-calorie malnutrition: Secondary | ICD-10-CM | POA: Diagnosis not present

## 2023-08-04 DIAGNOSIS — N179 Acute kidney failure, unspecified: Secondary | ICD-10-CM | POA: Diagnosis not present

## 2023-08-04 DIAGNOSIS — G9341 Metabolic encephalopathy: Secondary | ICD-10-CM | POA: Diagnosis not present

## 2023-08-04 DIAGNOSIS — Z931 Gastrostomy status: Secondary | ICD-10-CM | POA: Diagnosis not present

## 2023-08-04 DIAGNOSIS — Z792 Long term (current) use of antibiotics: Secondary | ICD-10-CM | POA: Diagnosis not present

## 2023-08-04 DIAGNOSIS — N183 Chronic kidney disease, stage 3 unspecified: Secondary | ICD-10-CM | POA: Diagnosis not present

## 2023-08-04 DIAGNOSIS — R4182 Altered mental status, unspecified: Secondary | ICD-10-CM | POA: Diagnosis not present

## 2023-08-04 DIAGNOSIS — D631 Anemia in chronic kidney disease: Secondary | ICD-10-CM | POA: Diagnosis not present

## 2023-08-04 DIAGNOSIS — R1310 Dysphagia, unspecified: Secondary | ICD-10-CM | POA: Diagnosis not present

## 2023-08-04 DIAGNOSIS — R54 Age-related physical debility: Secondary | ICD-10-CM | POA: Diagnosis not present

## 2023-08-04 DIAGNOSIS — I480 Paroxysmal atrial fibrillation: Secondary | ICD-10-CM | POA: Diagnosis not present

## 2023-08-04 DIAGNOSIS — I5032 Chronic diastolic (congestive) heart failure: Secondary | ICD-10-CM | POA: Diagnosis not present

## 2023-08-05 DIAGNOSIS — R509 Fever, unspecified: Secondary | ICD-10-CM | POA: Diagnosis not present

## 2023-08-05 DIAGNOSIS — I5032 Chronic diastolic (congestive) heart failure: Secondary | ICD-10-CM | POA: Diagnosis not present

## 2023-08-05 DIAGNOSIS — R54 Age-related physical debility: Secondary | ICD-10-CM | POA: Diagnosis not present

## 2023-08-05 DIAGNOSIS — E43 Unspecified severe protein-calorie malnutrition: Secondary | ICD-10-CM | POA: Diagnosis not present

## 2023-08-05 DIAGNOSIS — J90 Pleural effusion, not elsewhere classified: Secondary | ICD-10-CM | POA: Diagnosis not present

## 2023-08-05 DIAGNOSIS — N179 Acute kidney failure, unspecified: Secondary | ICD-10-CM | POA: Diagnosis not present

## 2023-08-05 DIAGNOSIS — I480 Paroxysmal atrial fibrillation: Secondary | ICD-10-CM | POA: Diagnosis not present

## 2023-08-05 DIAGNOSIS — E870 Hyperosmolality and hypernatremia: Secondary | ICD-10-CM | POA: Diagnosis not present

## 2023-08-05 DIAGNOSIS — R140 Abdominal distension (gaseous): Secondary | ICD-10-CM | POA: Diagnosis not present

## 2023-08-05 DIAGNOSIS — R4182 Altered mental status, unspecified: Secondary | ICD-10-CM | POA: Diagnosis not present

## 2023-08-05 DIAGNOSIS — N183 Chronic kidney disease, stage 3 unspecified: Secondary | ICD-10-CM | POA: Diagnosis not present

## 2023-08-05 DIAGNOSIS — I130 Hypertensive heart and chronic kidney disease with heart failure and stage 1 through stage 4 chronic kidney disease, or unspecified chronic kidney disease: Secondary | ICD-10-CM | POA: Diagnosis not present

## 2023-08-05 DIAGNOSIS — R0689 Other abnormalities of breathing: Secondary | ICD-10-CM | POA: Diagnosis not present

## 2023-08-05 DIAGNOSIS — R1310 Dysphagia, unspecified: Secondary | ICD-10-CM | POA: Diagnosis not present

## 2023-08-05 DIAGNOSIS — G9341 Metabolic encephalopathy: Secondary | ICD-10-CM | POA: Diagnosis not present

## 2023-08-06 ENCOUNTER — Telehealth: Payer: Self-pay | Admitting: Internal Medicine

## 2023-08-06 DIAGNOSIS — E43 Unspecified severe protein-calorie malnutrition: Secondary | ICD-10-CM | POA: Diagnosis not present

## 2023-08-06 DIAGNOSIS — E870 Hyperosmolality and hypernatremia: Secondary | ICD-10-CM | POA: Diagnosis not present

## 2023-08-06 DIAGNOSIS — R54 Age-related physical debility: Secondary | ICD-10-CM | POA: Diagnosis not present

## 2023-08-06 DIAGNOSIS — N183 Chronic kidney disease, stage 3 unspecified: Secondary | ICD-10-CM | POA: Diagnosis not present

## 2023-08-06 DIAGNOSIS — N179 Acute kidney failure, unspecified: Secondary | ICD-10-CM | POA: Diagnosis not present

## 2023-08-06 DIAGNOSIS — R4182 Altered mental status, unspecified: Secondary | ICD-10-CM | POA: Diagnosis not present

## 2023-08-06 DIAGNOSIS — I5032 Chronic diastolic (congestive) heart failure: Secondary | ICD-10-CM | POA: Diagnosis not present

## 2023-08-06 DIAGNOSIS — I480 Paroxysmal atrial fibrillation: Secondary | ICD-10-CM | POA: Diagnosis not present

## 2023-08-06 DIAGNOSIS — G9341 Metabolic encephalopathy: Secondary | ICD-10-CM | POA: Diagnosis not present

## 2023-08-06 DIAGNOSIS — U071 COVID-19: Secondary | ICD-10-CM | POA: Diagnosis not present

## 2023-08-06 DIAGNOSIS — R1310 Dysphagia, unspecified: Secondary | ICD-10-CM | POA: Diagnosis not present

## 2023-08-06 NOTE — Telephone Encounter (Signed)
FMLA for son to help with this patient (mother)  Copied Noted Sleeved Call when forms are ready Jeaneane Barnier (son) [phone]. also fax to [employer] [phone]

## 2023-08-06 NOTE — Telephone Encounter (Signed)
Form uploaded to S drive. Completed and ready for provider signature.

## 2023-08-07 DIAGNOSIS — R54 Age-related physical debility: Secondary | ICD-10-CM | POA: Diagnosis not present

## 2023-08-07 DIAGNOSIS — R1310 Dysphagia, unspecified: Secondary | ICD-10-CM | POA: Diagnosis not present

## 2023-08-07 DIAGNOSIS — U071 COVID-19: Secondary | ICD-10-CM | POA: Diagnosis not present

## 2023-08-07 DIAGNOSIS — G9341 Metabolic encephalopathy: Secondary | ICD-10-CM | POA: Diagnosis not present

## 2023-08-07 DIAGNOSIS — I5032 Chronic diastolic (congestive) heart failure: Secondary | ICD-10-CM | POA: Diagnosis not present

## 2023-08-07 DIAGNOSIS — N183 Chronic kidney disease, stage 3 unspecified: Secondary | ICD-10-CM | POA: Diagnosis not present

## 2023-08-07 DIAGNOSIS — I130 Hypertensive heart and chronic kidney disease with heart failure and stage 1 through stage 4 chronic kidney disease, or unspecified chronic kidney disease: Secondary | ICD-10-CM | POA: Diagnosis not present

## 2023-08-07 DIAGNOSIS — E870 Hyperosmolality and hypernatremia: Secondary | ICD-10-CM | POA: Diagnosis not present

## 2023-08-07 DIAGNOSIS — I480 Paroxysmal atrial fibrillation: Secondary | ICD-10-CM | POA: Diagnosis not present

## 2023-08-07 DIAGNOSIS — N179 Acute kidney failure, unspecified: Secondary | ICD-10-CM | POA: Diagnosis not present

## 2023-08-07 DIAGNOSIS — R4182 Altered mental status, unspecified: Secondary | ICD-10-CM | POA: Diagnosis not present

## 2023-08-07 NOTE — Telephone Encounter (Signed)
Form faxed and pt can be notified

## 2023-08-08 DIAGNOSIS — R1032 Left lower quadrant pain: Secondary | ICD-10-CM | POA: Diagnosis not present

## 2023-08-08 DIAGNOSIS — G9341 Metabolic encephalopathy: Secondary | ICD-10-CM | POA: Diagnosis not present

## 2023-08-08 DIAGNOSIS — Z4682 Encounter for fitting and adjustment of non-vascular catheter: Secondary | ICD-10-CM | POA: Diagnosis not present

## 2023-08-08 DIAGNOSIS — U071 COVID-19: Secondary | ICD-10-CM | POA: Diagnosis not present

## 2023-08-08 DIAGNOSIS — R1310 Dysphagia, unspecified: Secondary | ICD-10-CM | POA: Diagnosis not present

## 2023-08-08 DIAGNOSIS — R4182 Altered mental status, unspecified: Secondary | ICD-10-CM | POA: Diagnosis not present

## 2023-08-08 DIAGNOSIS — I480 Paroxysmal atrial fibrillation: Secondary | ICD-10-CM | POA: Diagnosis not present

## 2023-08-08 DIAGNOSIS — N183 Chronic kidney disease, stage 3 unspecified: Secondary | ICD-10-CM | POA: Diagnosis not present

## 2023-08-08 DIAGNOSIS — R54 Age-related physical debility: Secondary | ICD-10-CM | POA: Diagnosis not present

## 2023-08-08 DIAGNOSIS — N179 Acute kidney failure, unspecified: Secondary | ICD-10-CM | POA: Diagnosis not present

## 2023-08-08 DIAGNOSIS — I5032 Chronic diastolic (congestive) heart failure: Secondary | ICD-10-CM | POA: Diagnosis not present

## 2023-08-08 DIAGNOSIS — E43 Unspecified severe protein-calorie malnutrition: Secondary | ICD-10-CM | POA: Diagnosis not present

## 2023-08-08 DIAGNOSIS — E870 Hyperosmolality and hypernatremia: Secondary | ICD-10-CM | POA: Diagnosis not present

## 2023-08-09 DIAGNOSIS — I480 Paroxysmal atrial fibrillation: Secondary | ICD-10-CM | POA: Diagnosis not present

## 2023-08-09 DIAGNOSIS — R1310 Dysphagia, unspecified: Secondary | ICD-10-CM | POA: Diagnosis not present

## 2023-08-09 DIAGNOSIS — U071 COVID-19: Secondary | ICD-10-CM | POA: Diagnosis not present

## 2023-08-09 DIAGNOSIS — N179 Acute kidney failure, unspecified: Secondary | ICD-10-CM | POA: Diagnosis not present

## 2023-08-09 DIAGNOSIS — I5032 Chronic diastolic (congestive) heart failure: Secondary | ICD-10-CM | POA: Diagnosis not present

## 2023-08-09 DIAGNOSIS — E43 Unspecified severe protein-calorie malnutrition: Secondary | ICD-10-CM | POA: Diagnosis not present

## 2023-08-09 DIAGNOSIS — R54 Age-related physical debility: Secondary | ICD-10-CM | POA: Diagnosis not present

## 2023-08-09 DIAGNOSIS — G9341 Metabolic encephalopathy: Secondary | ICD-10-CM | POA: Diagnosis not present

## 2023-08-09 DIAGNOSIS — N183 Chronic kidney disease, stage 3 unspecified: Secondary | ICD-10-CM | POA: Diagnosis not present

## 2023-08-09 DIAGNOSIS — R4182 Altered mental status, unspecified: Secondary | ICD-10-CM | POA: Diagnosis not present

## 2023-08-09 DIAGNOSIS — E870 Hyperosmolality and hypernatremia: Secondary | ICD-10-CM | POA: Diagnosis not present

## 2023-08-10 DIAGNOSIS — R1310 Dysphagia, unspecified: Secondary | ICD-10-CM | POA: Diagnosis not present

## 2023-08-10 DIAGNOSIS — R4182 Altered mental status, unspecified: Secondary | ICD-10-CM | POA: Diagnosis not present

## 2023-08-10 DIAGNOSIS — E43 Unspecified severe protein-calorie malnutrition: Secondary | ICD-10-CM | POA: Diagnosis not present

## 2023-08-10 DIAGNOSIS — N179 Acute kidney failure, unspecified: Secondary | ICD-10-CM | POA: Diagnosis not present

## 2023-08-10 DIAGNOSIS — R54 Age-related physical debility: Secondary | ICD-10-CM | POA: Diagnosis not present

## 2023-08-10 DIAGNOSIS — E870 Hyperosmolality and hypernatremia: Secondary | ICD-10-CM | POA: Diagnosis not present

## 2023-08-10 DIAGNOSIS — G9341 Metabolic encephalopathy: Secondary | ICD-10-CM | POA: Diagnosis not present

## 2023-08-10 DIAGNOSIS — I480 Paroxysmal atrial fibrillation: Secondary | ICD-10-CM | POA: Diagnosis not present

## 2023-08-10 DIAGNOSIS — U071 COVID-19: Secondary | ICD-10-CM | POA: Diagnosis not present

## 2023-08-10 DIAGNOSIS — N183 Chronic kidney disease, stage 3 unspecified: Secondary | ICD-10-CM | POA: Diagnosis not present

## 2023-08-10 DIAGNOSIS — I5032 Chronic diastolic (congestive) heart failure: Secondary | ICD-10-CM | POA: Diagnosis not present

## 2023-08-11 DIAGNOSIS — G9341 Metabolic encephalopathy: Secondary | ICD-10-CM | POA: Diagnosis not present

## 2023-08-11 DIAGNOSIS — R54 Age-related physical debility: Secondary | ICD-10-CM | POA: Diagnosis not present

## 2023-08-11 DIAGNOSIS — I5032 Chronic diastolic (congestive) heart failure: Secondary | ICD-10-CM | POA: Diagnosis not present

## 2023-08-11 DIAGNOSIS — U071 COVID-19: Secondary | ICD-10-CM | POA: Diagnosis not present

## 2023-08-11 DIAGNOSIS — E43 Unspecified severe protein-calorie malnutrition: Secondary | ICD-10-CM | POA: Diagnosis not present

## 2023-08-11 DIAGNOSIS — E870 Hyperosmolality and hypernatremia: Secondary | ICD-10-CM | POA: Diagnosis not present

## 2023-08-11 DIAGNOSIS — N179 Acute kidney failure, unspecified: Secondary | ICD-10-CM | POA: Diagnosis not present

## 2023-08-11 DIAGNOSIS — I480 Paroxysmal atrial fibrillation: Secondary | ICD-10-CM | POA: Diagnosis not present

## 2023-08-11 DIAGNOSIS — I130 Hypertensive heart and chronic kidney disease with heart failure and stage 1 through stage 4 chronic kidney disease, or unspecified chronic kidney disease: Secondary | ICD-10-CM | POA: Diagnosis not present

## 2023-08-11 DIAGNOSIS — R4182 Altered mental status, unspecified: Secondary | ICD-10-CM | POA: Diagnosis not present

## 2023-08-11 DIAGNOSIS — N183 Chronic kidney disease, stage 3 unspecified: Secondary | ICD-10-CM | POA: Diagnosis not present

## 2023-08-11 LAB — CUP PACEART REMOTE DEVICE CHECK
Date Time Interrogation Session: 20241129230707
Implantable Pulse Generator Implant Date: 20240506

## 2023-08-12 ENCOUNTER — Ambulatory Visit (INDEPENDENT_AMBULATORY_CARE_PROVIDER_SITE_OTHER): Payer: Medicare HMO

## 2023-08-12 DIAGNOSIS — N183 Chronic kidney disease, stage 3 unspecified: Secondary | ICD-10-CM | POA: Diagnosis not present

## 2023-08-12 DIAGNOSIS — R4182 Altered mental status, unspecified: Secondary | ICD-10-CM | POA: Diagnosis not present

## 2023-08-12 DIAGNOSIS — I130 Hypertensive heart and chronic kidney disease with heart failure and stage 1 through stage 4 chronic kidney disease, or unspecified chronic kidney disease: Secondary | ICD-10-CM | POA: Diagnosis not present

## 2023-08-12 DIAGNOSIS — R5381 Other malaise: Secondary | ICD-10-CM | POA: Diagnosis not present

## 2023-08-12 DIAGNOSIS — E871 Hypo-osmolality and hyponatremia: Secondary | ICD-10-CM | POA: Diagnosis not present

## 2023-08-12 DIAGNOSIS — N179 Acute kidney failure, unspecified: Secondary | ICD-10-CM | POA: Diagnosis not present

## 2023-08-12 DIAGNOSIS — I480 Paroxysmal atrial fibrillation: Secondary | ICD-10-CM | POA: Diagnosis not present

## 2023-08-12 DIAGNOSIS — Z931 Gastrostomy status: Secondary | ICD-10-CM | POA: Diagnosis not present

## 2023-08-12 DIAGNOSIS — Z8673 Personal history of transient ischemic attack (TIA), and cerebral infarction without residual deficits: Secondary | ICD-10-CM

## 2023-08-12 DIAGNOSIS — I5032 Chronic diastolic (congestive) heart failure: Secondary | ICD-10-CM | POA: Diagnosis not present

## 2023-08-12 DIAGNOSIS — U071 COVID-19: Secondary | ICD-10-CM | POA: Diagnosis not present

## 2023-08-12 DIAGNOSIS — E43 Unspecified severe protein-calorie malnutrition: Secondary | ICD-10-CM | POA: Diagnosis not present

## 2023-08-12 DIAGNOSIS — G9341 Metabolic encephalopathy: Secondary | ICD-10-CM | POA: Diagnosis not present

## 2023-08-12 DIAGNOSIS — R1310 Dysphagia, unspecified: Secondary | ICD-10-CM | POA: Diagnosis not present

## 2023-08-13 DIAGNOSIS — I130 Hypertensive heart and chronic kidney disease with heart failure and stage 1 through stage 4 chronic kidney disease, or unspecified chronic kidney disease: Secondary | ICD-10-CM | POA: Diagnosis not present

## 2023-08-13 DIAGNOSIS — R1310 Dysphagia, unspecified: Secondary | ICD-10-CM | POA: Diagnosis not present

## 2023-08-13 DIAGNOSIS — N183 Chronic kidney disease, stage 3 unspecified: Secondary | ICD-10-CM | POA: Diagnosis not present

## 2023-08-13 DIAGNOSIS — E43 Unspecified severe protein-calorie malnutrition: Secondary | ICD-10-CM | POA: Diagnosis not present

## 2023-08-13 DIAGNOSIS — G9341 Metabolic encephalopathy: Secondary | ICD-10-CM | POA: Diagnosis not present

## 2023-08-13 DIAGNOSIS — R4182 Altered mental status, unspecified: Secondary | ICD-10-CM | POA: Diagnosis not present

## 2023-08-13 DIAGNOSIS — I5030 Unspecified diastolic (congestive) heart failure: Secondary | ICD-10-CM | POA: Diagnosis not present

## 2023-08-13 DIAGNOSIS — E871 Hypo-osmolality and hyponatremia: Secondary | ICD-10-CM | POA: Diagnosis not present

## 2023-08-13 DIAGNOSIS — U071 COVID-19: Secondary | ICD-10-CM | POA: Diagnosis not present

## 2023-08-13 DIAGNOSIS — N179 Acute kidney failure, unspecified: Secondary | ICD-10-CM | POA: Diagnosis not present

## 2023-08-13 DIAGNOSIS — I480 Paroxysmal atrial fibrillation: Secondary | ICD-10-CM | POA: Diagnosis not present

## 2023-08-14 ENCOUNTER — Ambulatory Visit: Payer: Medicare HMO | Admitting: Cardiology

## 2023-08-14 DIAGNOSIS — I130 Hypertensive heart and chronic kidney disease with heart failure and stage 1 through stage 4 chronic kidney disease, or unspecified chronic kidney disease: Secondary | ICD-10-CM | POA: Diagnosis not present

## 2023-08-14 DIAGNOSIS — R1310 Dysphagia, unspecified: Secondary | ICD-10-CM | POA: Diagnosis not present

## 2023-08-14 DIAGNOSIS — R54 Age-related physical debility: Secondary | ICD-10-CM | POA: Diagnosis not present

## 2023-08-14 DIAGNOSIS — N183 Chronic kidney disease, stage 3 unspecified: Secondary | ICD-10-CM | POA: Diagnosis not present

## 2023-08-14 DIAGNOSIS — E43 Unspecified severe protein-calorie malnutrition: Secondary | ICD-10-CM | POA: Diagnosis not present

## 2023-08-14 DIAGNOSIS — U071 COVID-19: Secondary | ICD-10-CM | POA: Diagnosis not present

## 2023-08-14 DIAGNOSIS — I480 Paroxysmal atrial fibrillation: Secondary | ICD-10-CM | POA: Diagnosis not present

## 2023-08-14 DIAGNOSIS — L89159 Pressure ulcer of sacral region, unspecified stage: Secondary | ICD-10-CM | POA: Diagnosis not present

## 2023-08-14 DIAGNOSIS — I5032 Chronic diastolic (congestive) heart failure: Secondary | ICD-10-CM | POA: Diagnosis not present

## 2023-08-14 DIAGNOSIS — R509 Fever, unspecified: Secondary | ICD-10-CM | POA: Diagnosis not present

## 2023-08-14 DIAGNOSIS — G9341 Metabolic encephalopathy: Secondary | ICD-10-CM | POA: Diagnosis not present

## 2023-08-15 DIAGNOSIS — R54 Age-related physical debility: Secondary | ICD-10-CM | POA: Diagnosis not present

## 2023-08-15 DIAGNOSIS — R1310 Dysphagia, unspecified: Secondary | ICD-10-CM | POA: Diagnosis not present

## 2023-08-15 DIAGNOSIS — N183 Chronic kidney disease, stage 3 unspecified: Secondary | ICD-10-CM | POA: Diagnosis not present

## 2023-08-15 DIAGNOSIS — I10 Essential (primary) hypertension: Secondary | ICD-10-CM | POA: Diagnosis not present

## 2023-08-15 DIAGNOSIS — I5032 Chronic diastolic (congestive) heart failure: Secondary | ICD-10-CM | POA: Diagnosis not present

## 2023-08-15 DIAGNOSIS — E43 Unspecified severe protein-calorie malnutrition: Secondary | ICD-10-CM | POA: Diagnosis not present

## 2023-08-15 DIAGNOSIS — U071 COVID-19: Secondary | ICD-10-CM | POA: Diagnosis not present

## 2023-08-15 DIAGNOSIS — E870 Hyperosmolality and hypernatremia: Secondary | ICD-10-CM | POA: Diagnosis not present

## 2023-08-15 DIAGNOSIS — N179 Acute kidney failure, unspecified: Secondary | ICD-10-CM | POA: Diagnosis not present

## 2023-08-15 DIAGNOSIS — R4182 Altered mental status, unspecified: Secondary | ICD-10-CM | POA: Diagnosis not present

## 2023-08-15 DIAGNOSIS — I480 Paroxysmal atrial fibrillation: Secondary | ICD-10-CM | POA: Diagnosis not present

## 2023-08-15 DIAGNOSIS — G9341 Metabolic encephalopathy: Secondary | ICD-10-CM | POA: Diagnosis not present

## 2023-08-16 DIAGNOSIS — Z931 Gastrostomy status: Secondary | ICD-10-CM | POA: Diagnosis not present

## 2023-08-16 DIAGNOSIS — N184 Chronic kidney disease, stage 4 (severe): Secondary | ICD-10-CM | POA: Diagnosis not present

## 2023-08-16 DIAGNOSIS — Z6833 Body mass index (BMI) 33.0-33.9, adult: Secondary | ICD-10-CM | POA: Diagnosis not present

## 2023-08-16 DIAGNOSIS — I5032 Chronic diastolic (congestive) heart failure: Secondary | ICD-10-CM | POA: Diagnosis not present

## 2023-08-16 DIAGNOSIS — R54 Age-related physical debility: Secondary | ICD-10-CM | POA: Diagnosis not present

## 2023-08-16 DIAGNOSIS — Z743 Need for continuous supervision: Secondary | ICD-10-CM | POA: Diagnosis not present

## 2023-08-16 DIAGNOSIS — D631 Anemia in chronic kidney disease: Secondary | ICD-10-CM | POA: Diagnosis not present

## 2023-08-16 DIAGNOSIS — I130 Hypertensive heart and chronic kidney disease with heart failure and stage 1 through stage 4 chronic kidney disease, or unspecified chronic kidney disease: Secondary | ICD-10-CM | POA: Diagnosis not present

## 2023-08-16 DIAGNOSIS — E43 Unspecified severe protein-calorie malnutrition: Secondary | ICD-10-CM | POA: Diagnosis not present

## 2023-08-16 DIAGNOSIS — I480 Paroxysmal atrial fibrillation: Secondary | ICD-10-CM | POA: Diagnosis not present

## 2023-08-16 DIAGNOSIS — Z7401 Bed confinement status: Secondary | ICD-10-CM | POA: Diagnosis not present

## 2023-08-16 DIAGNOSIS — R404 Transient alteration of awareness: Secondary | ICD-10-CM | POA: Diagnosis not present

## 2023-08-16 DIAGNOSIS — L98421 Non-pressure chronic ulcer of back limited to breakdown of skin: Secondary | ICD-10-CM | POA: Diagnosis not present

## 2023-08-16 DIAGNOSIS — Z8616 Personal history of COVID-19: Secondary | ICD-10-CM | POA: Diagnosis not present

## 2023-08-16 DIAGNOSIS — R1310 Dysphagia, unspecified: Secondary | ICD-10-CM | POA: Diagnosis not present

## 2023-08-16 NOTE — Discharge Summary (Signed)
 ------------------------------------------------------------------------------- Attestation signed by Rosena Tyronne Larger, MD at 08/16/23 [phone] I have seen, examined, and chart reviewed for  [name] and supervised the nurse practitioner/PA in the care of the patient.  Relevant conversation regarding plan of care has been done.  I have discussed with the APP and agree with the assessment and plan.  Hzw:Jdozze Heent: wnl Heart: RRR Pulmonary: CTAB Abdomen:Soft, NT, ND Extremities:No edema   Tyronne Rosena, MD -------------------------------------------------------------------------------  Physician Discharge Summary  Admit date: 07/17/2023  Discharge date: 08/16/2023   Discharge to: Home with [facility], daughter [name]  Discharge Service: General Medicine (MED)  Discharge Attending Physician: Brutus FORBES Shank, FNP  Discharge Diagnoses:  Active Problems:   Atrial fibrillation (CMS-HCC)   Chronic heart failure with preserved ejection fraction (CMS-HCC)   Essential hypertension   Dysphagia   Chronic ulcer of sacral region limited to breakdown of skin (CMS-HCC)   Severe protein-calorie malnutrition (CMS-HCC)   Anemia due to stage 4 chronic kidney disease (CMS-HCC)    Procedures: PEG tube insertion 11/18 by Dr. Straughan CT head 11/6 Chest x-ray 11/6, 11/13, 11/19, 11/25 Abdominal x-ray 11/20, 11/28 MRI brain 11/8 Doppler left upper extremity 11/19  Consults:     General Surgery, Physical Therapy Evaluation and Treatment, and Occupational Therapy Evaluation and Treatment, speech therapy evaluation and treatment  Home Health/DME: Case Management has organized home health.  With [facility]  Pertinent Test Results:  See all test results below  Physical Exam: BP 130/62   Pulse 82   Temp 37.7 C (99.9 F) (Axillary)   Resp 18   Ht 166 cm (5' 5.35)   Wt 92.9 kg (204 lb 12.8 oz)   LMP  (LMP Unknown)   SpO2 94%   BMI 33.71 kg/m    Physical Exam Vitals  and nursing note reviewed.  Constitutional:      Appearance: She is obese. She is ill-appearing.  HENT:     Head: Normocephalic.     Right Ear: External ear normal.     Left Ear: External ear normal.     Nose: Nose normal.     Mouth/Throat:     Mouth: Mucous membranes are moist.  Eyes:     Pupils: Pupils are equal, round, and reactive to light.  Cardiovascular:     Rate and Rhythm: Normal rate and regular rhythm.     Pulses: Normal pulses.     Heart sounds: Normal heart sounds.  Pulmonary:     Effort: Pulmonary effort is normal.     Breath sounds: Normal breath sounds.  Abdominal:     General: Bowel sounds are normal.     Palpations: Abdomen is soft.     Comments: PEG tube in place  Musculoskeletal:        General: Normal range of motion.     Cervical back: Neck supple.     Right lower leg: No edema.     Left lower leg: No edema.  Skin:    General: Skin is warm and dry.     Findings: Lesion (Sacral skin breakdown, see media) present.  Neurological:     General: No focal deficit present.     Mental Status: She is alert.     Comments: Unable to assess orientation, patient not verbalizing today  Psychiatric:        Mood and Affect: Mood normal.      Hospital Course:   [name] is a 89 y.o. female that presented to [facility] and was  admitted for Metabolic encephalopathy on 07/17/2023 12:38 PM .  HPI from admission on July 17, 2023: [name] is a 89 y.o. female that presented to [facility] 11/6 and was admitted for Altered mental status.   89 yo F with h/o CHF, CVA, CKD stage 3 and recented treated at this facility and discharged for UTI. Presents from rehab [facility] rehab) due to AMS. Per daughter she has been more lethargic and anorexic for the last few days prior to admission. Staff at SNF state the patient has been pocketing food in her mouth. No known urinary symptoms although patient herself is unable to give much meaningful  history due to somnolence. She was very lethargic upon arrival to the ED and was not oriented.  She falls asleep easily.    Patient was recently hospitalized and discharged for AMS, UTI and weakness.  Urine culture grew MDR Ecoli, antibiotics were changed to Merrem  on 10/25.  Discharged with 7 days of therapy due to complicated MDR UTI with end date of November 1.  On admission patient's sodium was markedly elevated, 159.  Calcium  14.8 with albumin  2.0.  Creatinine 3.03.  She was thought to be severely dehydrated.  Was given IV fluid hydration as she was too somnolent to take in food or fluids p.o.  Her sodium slowly corrected as well as her calcium .  She was given Aredia 11/11 and her calcium  has corrected to normal.  Due to prolonged lack of p.o. intake the family made the decision to proceed with PEG tube insertion on 11/18.  Since that time she has been receiving tube feedings.  Her level of alertness has only improved minimally, patient remains somnolent, more arousable at sometimes than others.  There are occasional times when the patient is alert and interactive, laughs and answers questions or responds.  Today the patient is awake but not responding verbally.  She pushes away with her arms during physical exam.  Daughter [name] has decided to take the patient home and care for her there with the help of [facility] who has provided hospital bed and other medical supplies.  Patient is medically stable and ready for discharge today.  She has completed 7 days of Zosyn therapy for Pseudomonas UTI.  She is on room air and is tolerating tube feedings at goal, 60/h.  The following problems were addressed while the patient was admitted to [facility];  Metabolic encephalopathy, AMS, hypernatremia, hypercalcemia All resolved although patient remains sleepy    2.  Possible aspiration PNA No oxygen requirement Completed 4 days of Unasyn, day 7 of Zosyn Afebrile, no white blood count  elevation   3.  Afib, paroxysmal Rate has been controlled throughout admission without metoprolol  but has been resumed at 12.5 mg daily, patient tolerating well Xarelto  and the tube for anticoagulation   4.  Age related physical debility with acute weakness/severe protein calorie malnutrition Tube feedings are at goal PT and OT have worked with the patient, she is severely debilitated   5.  Severe Protein-Calorie Malnutrition in the context of acute illness or injury (07/26/23 1500) Severe Protein-Calorie Malnutrition in the context of acute illness or injury (07/26/23 1500) Energy Intake: < or equal to 50% of estimated energy requirement of > or equal to 5 days Interpretation of Wt. Loss: > 2% x 1 week Fat Loss: Moderate Muscle Loss: Moderate Malnutrition Score: 4   5.  Chronic HF with preserved EF   Patient currently euvolemic Torsemide  has been resumed  6.  CKD stage III with superimposed AKI Creatinine is trending down every day, 1.37 today   7.  Dysphagia Started at rehab, was given a pured diet Unable to swallow throughout this admission, PEG tube inserted and patient getting tube feeds Able to take small sips of water    8.  Constipation -RESOLVED   9.  Hypokalemia --RESOLVED -Potassium slightly high at 5.4, trending down   10.  Hypomagnesemia - Resolved, 2.0 at last check   11.  Skin breakdown sacral area Sacral, see media for pics, updated 11/26 Allevyn applied Offload, turn every 2 hours Air mattress obtained 11/9 Complicated due to severe protein calorie malnutrition, patient has very poor wound healing potential   12.  HTN Blood pressure remained stable but has trended higher, metoprolol  has been restarted   13.  Hypercalcemia Calcium  high since admission but now normal at 8.8 Thought initially to be dehydration Aredia 11/11 (due to renal function) Continue free water  flushes through PEG tube   14.  Fever Resolved   15.  Covid 19 Diagnosis  11/25, completed course of remdesivir No oxygen requirement   16.  Anemia Thought to be of chronic disease, not acute bleeding as hemoglobin has been trending down gradually  Today hemoglobin 8.1 Continue anticoagulation, no obvious bleeding, platelets normal Transfused one unit of blood August 08, 2023   Patient is medically stable and ready for discharge home with family and hospice support.  Discharge plan discussed with patient's daughter [name] yesterday and today by phone.  EMS will transport the patient home.  Discussed patient's discharge plan with Dr. Rosena who agrees with the plan.  Discharge home with hospice.   I spent greater than 30 mins in the discharge of this patient.  Condition at Discharge: stable Discharge Medications:    Your Medication List     STOP taking these medications    meropenem  1 g in sodium chloride  0.9 % 0.9 % 100 mL IVPB   nitroglycerin  0.4 MG SL tablet Commonly known as: NITROSTAT    omeprazole  40 MG capsule Commonly known as: PriLOSEC   polyethylene glycol 17 gram packet Commonly known as: MIRALAX        START taking these medications    ascorbic acid (vitamin C) 1000 MG tablet Commonly known as: VITAMIN C 1 tablet (1,000 mg total) by G-tube route two (2) times a day.   esomeprazole  40 mg packet Commonly known as: NEXIUM  1 packet (40 mg total) by Enteral tube: gastric route daily. Start taking on: August 17, 2023   guaiFENesin  100 mg/5 mL syrup Commonly known as: ROBITUSSIN 10 mL (200 mg total) by G-tube route every four (4) hours as needed for cough or congestion.   loperamide 2 mg capsule Commonly known as: IMODIUM 1 capsule (2 mg total) by G-tube route four (4) times a day as needed.       CHANGE how you take these medications    acetaminophen  500 MG tablet Commonly known as: TYLENOL  2 tablets (1,000 mg total) by G-tube route every six (6) hours as needed for pain. What changed: how to take this   aspirin  81 MG  tablet Commonly known as: ECOTRIN 1 tablet (81 mg total) by G-tube route daily. What changed: how to take this   atorvastatin 40 MG tablet Commonly known as: LIPITOR 1 tablet (40 mg total) by G-tube route daily. What changed: how to take this   ergocalciferol -1,250 mcg (50,000 unit) 1,250 mcg (50,000 unit) capsule Commonly known as: DRISDOL 1 capsule (  1,250 mcg total) by G-tube route once a week. What changed: how to take this   hydrALAZINE  25 MG tablet Commonly known as: APRESOLINE  1 tablet (25 mg total) by G-tube route two (2) times a day. What changed: how to take this   rivaroxaban  15 mg Tab Commonly known as: XARELTO  1 tablet (15 mg total) by Enteral tube: gastric route daily with evening meal. What changed: how to take this   torsemide  20 MG tablet Commonly known as: DEMADEX  0.5 tablets (10 mg total) by G-tube route daily. What changed: how to take this       CONTINUE taking these medications    docusate sodium  100 MG capsule Commonly known as: COLACE Take 2 capsules (200 mg total) by mouth two (2) times a day.   metoPROLOL  succinate 25 MG 24 hr tablet Commonly known as: Toprol -XL Take 0.5 tablets (12.5 mg total) by mouth daily. Start taking on: August 17, 2023        Labs: Blood Recent Labs    Units 08/10/23 [id number] 08/11/23 0651 08/12/23 0606 08/13/23 0450 08/14/23 0400 08/15/23 0427 08/16/23 0513  WBC 10*9/L 7.7 6.2 6.7 9.7 6.0 8.3 7.8  HGB g/dL 9.6* 8.3* 8.1* 8.6* 9.0* 8.3* 8.1*  HCT % 30.4* 26.0* 25.6* 27.0* 27.8* 25.1* 25.0*  PLT 10*9/L 324 353 385 436* 429* 407 425*   Recent Labs    Units 08/10/23 0616 08/11/23 0651 08/12/23 0606 08/13/23 0450 08/14/23 0400 08/15/23 0427 08/16/23 0513  NA mmol/L 141 145 144 140 141 139 138  K mmol/L 4.9 5.1* 5.6* 5.7* 5.6* 5.4* 5.7*  CL mmol/L 109* 112* 111* 109* 108* 106 105  CO2 mmol/L 26.3 25.9 25.9 27.2 27.5 27.4 28.1  BUN mg/dL 33* 32* 28* 28* 27* 24* 24*  CREATININE mg/dL 8.41* 8.31* 8.41*  8.35* 1.54* 1.41* 1.37*  GLU mg/dL 875 895 892 878 883 872 116  CALCIUM  mg/dL 8.0* 7.9* 8.2* 8.6 8.5 8.7 8.8   No results for input(s): CKTOTAL, TROPONINI, TROPONINT, CKMB, EDTPNI, PROBNP, CHOL, LDL, HDL, TRIG in the last 168 hours. No results for input(s): INR, LABPROT, APTT, DDIMER in the last 168 hours. No results for input(s): TSH, ETOH, ACETAMIN, SALICYLATE, FREET3, T4FREE, A1C, ACETONE, TTR, ESR, CRP, HSCRP, ANA, RF, VITAMINB12, FOLATE, IRON , LABIRON, TIBC, FERRITIN, RETIC, RETICCTPCT, C3, C4, LDH, HAPTM, URICACID, HEPP4, CEA, RAPSCRN, CDIFRPCR, CDIFFNAP1, HIV12SCRN, HIVCP, TBCELLQN in the last 168 hours.No results for input(s): HEPAIGM, HEPBSAG, HEPBIGM, HEPCAB, MITOAB in the last 168 hours. Urine No results for input(s): WBCUA, NITRITE, LEUKOCYTESUR, BACTERIA, RBCUA, BLOODU, GLUCOSEU, PROTEINUA, KETONESU in the last 168 hours. No results found for: METH, OPIACU, [id number], AMPM, [id number], [id number], ORM27156, OXYCO  Body Fluids No results for input(s): FTYP1, WBCFLUID, FNEUT, LYMPHSFL, FMONO, EOSFL, RBCFL, CLARITYFLUID, COLORFL in the last 168 hours. ABG No results for input(s): O2SOUR, FIO2ART, PHART, PCO2ART, PO2ART, HCO3ART, O2SATART, BEART in the last 72 hours. Microbiology Results (last day)     ** No results found for the last 24 hours. **       Radiology: XR Abdomen 1 View Result Date: 08/08/2023 CLINICAL DATA:  Left lower quadrant pain EXAM: ABDOMEN - 1 VIEW COMPARISON:  07/31/2023 FINDINGS: Peg tube projects within the epigastric region. The bowel gas pattern is normal. Cholecystectomy clips. No radio-opaque calculi or other significant radiographic abnormality are seen.   Negative. Electronically Signed   By: Mabel Converse D.O.   On: 08/08/2023 12:27   XR Chest Portable Result Date:  08/05/2023 CLINICAL DATA:  89 year old female with  fever. EXAM: PORTABLE CHEST 1 VIEW COMPARISON:  Portable chest 07/30/2023 and earlier. FINDINGS: Portable AP upright view at 0851 hours. Hiatal hernia again evident, at least moderate size. Stable cardiac size and mediastinal contours. Anterior chest cardiac loop recorder or ICD again suspected. Improved lung volumes. Left lung base hypo ventilation. No pneumothorax, pulmonary edema. No air bronchograms. Difficult to exclude small effusions. Visualized tracheal air column is within normal limits. No acute osseous abnormality identified. Paucity of bowel gas the visible abdomen.   1. Hiatal hernia with left lung base hypoventilation. Favor atelectasis but lung base infection difficult to exclude in the setting of fever. Small pleural effusions also difficult to exclude. 2. No other acute cardiopulmonary abnormality. Electronically Signed   By: VEAR Hurst M.D.   On: 08/05/2023 12:11   XR Abdomen 1 View Result Date: 07/31/2023 CLINICAL DATA:  Intestinal obstruction EXAM: ABDOMEN - 1 VIEW COMPARISON:  07/05/2023 and CT scans from 09/26/2018 and 04/13/2017 FINDINGS: A PEG tube is observed projecting over the left abdomen. No dilated bowel identified. Right upper quadrant clips are likely from prior cholecystectomy. Retrocardiac lucency and surrounding density favoring a moderate-sized hiatal hernia.   1. No dilated bowel identified. 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 2. Moderate-sized hiatal hernia. 3. PEG tube projecting over the left abdomen. Electronically Signed   By: Ryan Salvage M.D.   On: 07/31/2023 12:16   PVL Venous Duplex Upper Extremity Left Result Date: 07/30/2023 CLINICAL DATA:  Left upper extremity swelling.   Evaluate for DVT. EXAM: LEFT UPPER EXTREMITY VENOUS DOPPLER ULTRASOUND TECHNIQUE: Gray-scale sonography with graded compression, as well as color Doppler and duplex ultrasound were performed to evaluate the upper extremity deep venous system from the level of the subclavian vein and including the jugular, axillary, basilic, radial, ulnar and upper cephalic vein. Spectral Doppler was utilized to evaluate flow at rest and with distal augmentation maneuvers. COMPARISON:  None Available. FINDINGS: Contralateral Subclavian Vein: Respiratory phasicity is normal and symmetric with the symptomatic side. No evidence of thrombus. Normal compressibility. Internal Jugular Vein: No evidence of thrombus. Normal compressibility, respiratory phasicity and response to augmentation. Subclavian Vein: No evidence of thrombus. Normal compressibility, respiratory phasicity and response to augmentation. Axillary Vein: No evidence of thrombus. Normal compressibility, respiratory phasicity and response to augmentation. Cephalic Vein: There is age-indeterminate mixed echogenic occlusive thrombus involving the left cephalic vein at the level of the left upper arm (images 15-18). The cephalic vein appears patent at the level of the forearm. Basilic Vein: No evidence of thrombus. Normal compressibility, respiratory phasicity and response to augmentation. Brachial Veins: No evidence of thrombus. Normal compressibility, respiratory phasicity and response to augmentation. Radial Veins: No evidence of thrombus. Normal compressibility, respiratory phasicity and response to augmentation. Ulnar Veins: No evidence of thrombus. Normal compressibility, respiratory phasicity and response to augmentation. Other Findings:  None visualized.   1. No evidence of DVT within the left upper extremity. 2. The examination is positive for age-indeterminate occlusive superficial thrombophlebitis involving the cephalic vein at the level of the left upper arm.  Electronically Signed   By: Norleen Roulette M.D.   On: 07/30/2023 13:43   XR Chest 1 view Result Date: 07/30/2023 CLINICAL DATA:  Cough, weakness. EXAM: CHEST  1 VIEW COMPARISON:  07/24/2023 FINDINGS:  Stable cardiac enlargement. Loop recorder is again noted in the projection of the left side of heart. Moderate to large hiatal hernia suspected.. Unchanged decreased aeration to the left base, which may reflect atelectasis, airspace disease and or pleural effusion. The appearance is unchanged when compared with 07/24/2023. Aeration to the right lower lung has improved in the interval.   1. Stable cardiac enlargement. 2. Unchanged decreased aeration to the left base which may reflect atelectasis, airspace disease or pleural effusion. Consider further evaluation with dedicated upright PA and lateral chest radiograph for more definitive characterization. 3. Improved aeration to the right base. 4. Moderate to large hiatal hernia. Electronically Signed   By: Waddell Calk M.D.   On: 07/30/2023 06:53   XR Chest Portable Result Date: 07/24/2023 CLINICAL DATA:  COUGH Cough EXAM: PORTABLE CHEST 1 VIEW COMPARISON:  Chest XR, 07/17/2023 and 07/02/2023. FINDINGS: Cardiac silhouette is similar to mildly enlarged. Aortic arch calcifications. Atrial appendage occluder. Lungs are hypoinflated with minimal retrocardiac and bibasilar patchy opacities.. No acute osseous abnormality.   1. Hypoinflation with minimal bibasilar opacities, favor atelectasis however early superimposed pneumonia can appear similar. 2.  Aortic Atherosclerosis (ICD10-I70.0). Electronically Signed   By: Thom Hall M.D.   On: 07/24/2023 07:46   ECG 12 Lead Result Date: 07/20/2023 Normal sinus rhythm Right bundle branch block Left anterior fascicular block ** Bifascicular block ** Abnormal ECG When compared with ECG of 02-Jul-2023 16:15, No significant change was found  MRI Brain Wo Contrast Result Date: 07/19/2023 CLINICAL DATA:  Stroke-like  symptoms.  Weakness. EXAM: MRI HEAD WITHOUT CONTRAST TECHNIQUE: Multiplanar, multiecho pulse sequences of the brain and surrounding structures were obtained without intravenous contrast. COMPARISON:  Head CT July 17, 2023.  MRI brain March 24, 2023. FINDINGS: The study is partially degraded by motion. Brain: No acute infarction, hemorrhage, hydrocephalus, extra-axial collection or mass lesion. Scattered confluent foci of T2 hyperintensity are seen within the white matter of the cerebral hemispheres, nonspecific, most likely related to chronic small vessel ischemia, similar to prior MRI. Moderate parenchymal volume loss. Vascular: Normal flow voids. Skull and upper cervical spine: Normal marrow signal. Sinuses/Orbits: Negative. Other: None.   1. No acute intracranial abnormality. 2. Moderate chronic microvascular ischemic changes of the white matter and parenchymal volume loss. Electronically Signed   By: Katyucia  de Macedo Rodrigues M.D.   On: 07/19/2023 11:59   CT Head Wo Contrast Result Date: 07/17/2023 CLINICAL DATA:  Altered mental status. EXAM: CT HEAD WITHOUT CONTRAST TECHNIQUE: Contiguous axial images were obtained from the base of the skull through the vertex without intravenous contrast. RADIATION DOSE REDUCTION: This exam was performed according to the departmental dose-optimization program which includes automated exposure control, adjustment of the mA and/or kV according to patient size and/or use of iterative reconstruction technique. COMPARISON:  Head CT 07/02/2023 FINDINGS: The study is mildly motion degraded. Brain: There is no evidence of an acute infarct, intracranial hemorrhage, mass, midline shift, or extra-axial fluid collection. Cerebral white matter hypodensities are unchanged and nonspecific but compatible with moderate chronic small vessel ischemic disease. Mild cerebral atrophy is within normal limits for age. Vascular: Calcified atherosclerosis at the skull base. No hyperdense  vessel. Skull: No acute fracture or suspicious osseous lesion. Sinuses/Orbits: Largely clear paranasal sinuses and mastoid air cells. No acute orbital finding. Other: None.   1. No evidence of acute intracranial abnormality. 2. Moderate chronic small vessel ischemic disease. Electronically Signed   By: Dasie Hamburg M.D.   On: 07/17/2023 15:34   XR  Chest Portable Result Date: 07/17/2023 CLINICAL DATA:  Altered mental status EXAM: PORTABLE CHEST - 1 VIEW COMPARISON:  07/02/2023 FINDINGS: Lungs are clear. Heart size and mediastinal contours are within normal limits. Implanted monitor projects over the left chest. No effusion. Right shoulder DJD.   No acute cardiopulmonary disease. Electronically Signed   By: JONETTA Faes M.D.   On: 07/17/2023 15:25    Discharge Instructions:  Activity Instructions     Activity as tolerated        Diet Instructions     Discharge diet (specify)     Discharge Nutrition Therapy: Regular      Other Instructions     Call MD for:  persistent nausea or vomiting     Call MD for:  severe uncontrolled pain     Call MD for: Temperature > 38.5 Celsius ( > 101.3 Fahrenheit)     Discharge instructions     Medications in the tube.  Metoprolol  dosage decreased to 12.5mg  daily.  Sips of water  when awake and alert only, while sitting straight up in bed.  No food by mouth.  Tube feedings and free water  flushes as directed in G tube.    [facility] staff will be assisting with care and will see her this evening at [facility].  Please do not leave patient alone at any time.  Call the hospital with questions/needs; [phone] and press 0 for the operator, ask her to page Hospitalist.    Return to the hospital with any new problems or worsening in current condition.   It was our pleasure taking care of you!   Discharge instructions to patient: Call your primary care doctor and make an appointment to see them:     Within 2 weeks from the time you are discharged from the hospital      Follow Up instructions and Outpatient Referrals    Call MD for:  persistent nausea or vomiting     Call MD for:  severe uncontrolled pain     Call MD for: Temperature > 38.5 Celsius ( > 101.3 Fahrenheit)     Discharge instructions      Appointments which have been scheduled for you    Sep 16, 2023 7:15 AM EXTERNAL APPOINTMENT with LTC NURSING  1- [facility] [place] [address] [place] [address] [phone]     Oct 02, 2023 10:00 AM EXTERNAL APPOINTMENT with LTC NURSING  1- [facility] [place] [facility] [place] [address] [place] [address] [phone]          This chart has been completed using [employer] Dictation software, and while attempts have been made to ensure accuracy, certain words and phrases may not be transcribed as intended  Brutus FORBES Shank, FNP

## 2023-08-19 ENCOUNTER — Telehealth: Payer: Self-pay

## 2023-08-19 NOTE — Transitions of Care (Post Inpatient/ED Visit) (Signed)
   08/19/2023  Name: [name] MRN: [id number] DOB: 1934/04/19  Today's TOC FU Call Status:   Patient's Name and Date of Birth confirmed.  Transition Care Management Follow-up Telephone Call Type of Discharge: Inpatient Admission Primary Inpatient Discharge Diagnosis:: weakness How have you been since you were released from the hospital?: Same (Spoke with daughter who confirms patinet was discharged to hospice.)  Items Reviewed:  Spoke with daughter who confirms that patient was discharged to her sisters house with hospice.   Lonia Chimera, RN, BSN, CEN [employer]- Transition of Care Team.  [facility] [phone]

## 2023-08-19 NOTE — Transitions of Care (Post Inpatient/ED Visit) (Signed)
   08/19/2023  Name: [name] MRN: [id number] DOB: March 06, 1934  Today's TOC FU Call Status: Today's TOC FU Call Status:: Unsuccessful Call (1st Attempt) Unsuccessful Call (1st Attempt) Date: 08/19/23  Attempted to reach the patient regarding the most recent Inpatient/ED visit. Placed call to patient and daughter to confirm if patient is still inpatient at [facility] or has discharged from another facility. Difficulty seeing data.  No answer with patient or daughter.   Follow Up Plan: Additional outreach attempts will be made to reach the patient to complete the Transitions of Care (Post Inpatient/ED visit) call.   Lonia Chimera, RN, BSN, CEN [employer]- Transition of Care Team.  [facility] [phone]

## 2023-08-20 ENCOUNTER — Telehealth: Payer: Self-pay | Admitting: Cardiology

## 2023-08-20 NOTE — Telephone Encounter (Signed)
Ok with phone visit per Dr.McDowell.

## 2023-08-20 NOTE — Telephone Encounter (Signed)
Pt's daughter would like to know if appt tomorrow is able to be virtual. Please advise

## 2023-08-21 ENCOUNTER — Encounter: Payer: Self-pay | Admitting: Cardiology

## 2023-08-21 ENCOUNTER — Telehealth: Payer: Self-pay

## 2023-08-21 ENCOUNTER — Ambulatory Visit: Payer: Medicare HMO | Attending: Cardiology | Admitting: Cardiology

## 2023-08-21 VITALS — BP 130/85 | Ht 65.0 in | Wt 198.0 lb

## 2023-08-21 DIAGNOSIS — I25119 Atherosclerotic heart disease of native coronary artery with unspecified angina pectoris: Secondary | ICD-10-CM | POA: Diagnosis not present

## 2023-08-21 DIAGNOSIS — I480 Paroxysmal atrial fibrillation: Secondary | ICD-10-CM

## 2023-08-21 DIAGNOSIS — N1832 Chronic kidney disease, stage 3b: Secondary | ICD-10-CM | POA: Diagnosis not present

## 2023-08-21 MED ORDER — NITROGLYCERIN 0.4 MG SL SUBL: SUBLINGUAL_TABLET | SUBLINGUAL | 3 refills | Status: DC

## 2023-08-21 NOTE — Progress Notes (Signed)
Virtual Visit via Telephone Note   Because of [name]'s co-morbid illnesses, she is at least at moderate risk for complications without adequate follow up.  This format is felt to be most appropriate for this patient at this time.  The patient did not have access to video technology/had technical difficulties with video requiring transitioning to audio format only (telephone).  All issues noted in this document were discussed and addressed.  No physical exam could be performed with this format.  Please refer to the patient's chart for her consent to telehealth for [facility].    Date:  08/21/2023   ID:  [name], DOB 04/28/1934, MRN [id number] The patient was identified using 2 identifiers.  Patient Location: Home Provider Location: Office/Clinic   PCP:  [name], MD  Evaluation Performed:  Follow-Up Visit  Chief Complaint: Cardiac follow-up  History of Present Illness:    [name] is an 89 y.o. female last seen in September.  Telephone communication arranged today for follow-up encounter.  I spoke with the patient and 2 of her daughters.  I reviewed interval records noting month-long admission at [facility] and home hospice at this time.  She was managed for metabolic encephalopathy in the setting of UTI, acute renal insufficiency, altered mental status with inability to take adequate oral intake, severe protein calorie malnutrition, possible aspiration pneumonia, COVID-19 diagnosis and dysphagia ultimately status post PEG tube insertion.  Brain MRI in early November did not demonstrate any acute findings.  I went over her medication list, overall stable from a cardiac perspective.  She is getting these medications delivered via PEG tube.  Her daughter did ask about whether she would be able to take anything orally indicating that mental status has improved as well as strength.  I did ask them to check with hospice regarding a follow-up swallowing  evaluation prior to giving her anything by mouth however.  Medtronic ILR in place with follow-up by Dr. Ladona Ridgel.  Recent device check indicated no new atrial fibrillation.   Prior CV studies:    Echocardiogram 03/25/2023:  1. Left ventricular ejection fraction, by estimation, is 65 to 70%. The  left ventricle has normal function. The left ventricle has no regional  wall motion abnormalities. There is mild concentric left ventricular  hypertrophy. Left ventricular diastolic  parameters are consistent with Grade I diastolic dysfunction (impaired  relaxation).   2. Right ventricular systolic function is normal. The right ventricular  size is normal.   3. Left atrial size was mildly dilated.   4. The mitral valve is normal in structure. Trivial mitral valve  regurgitation. No evidence of mitral stenosis.   5. The aortic valve is tricuspid. There is mild calcification of the  aortic valve. Aortic valve regurgitation is trivial. Aortic valve  sclerosis/calcification is present, without any evidence of aortic  stenosis.   6. The inferior vena cava is normal in size with greater than 50%  respiratory variability, suggesting right atrial pressure of 3 mmHg.   Labs/Other Tests and Data Reviewed:    EKG:  An ECG dated 06/21/2023 was personally reviewed today and demonstrated:  Sinus rhythm with right bundle branch block and left anterior fascicular block.  Recent Labs: 06/14/2023: Magnesium 1.9 06/21/2023: ALT 17; B Natriuretic Peptide 43.0; BUN 29; Creatinine, Ser 2.07; Hemoglobin 11.3; Platelets 311; Potassium 4.2; Sodium 133  December 2024: Hemoglobin 8.1, platelets 425, potassium 5.7, BUN 24, creatinine 1.37, GFR 37  Recent Lipid Panel Lab Results  Component Value Date/Time   CHOL 103 08/21/2022 04:31 AM   CHOL 146 01/19/2015 10:48 AM   TRIG 58 08/21/2022 04:31 AM   HDL 41 08/21/2022 04:31 AM   HDL 48 01/19/2015 10:48 AM   CHOLHDL 2.5 08/21/2022 04:31 AM   LDLCALC 50 08/21/2022  04:31 AM   LDLCALC 77 01/19/2015 10:48 AM    Wt Readings from Last 3 Encounters:  08/21/23 198 lb (89.8 kg)  06/21/23 199 lb 8.3 oz (90.5 kg)  06/14/23 199 lb 8.3 oz (90.5 kg)        Objective:    Vital Signs:  BP 130/85   Ht 5\' 5"  (1.651 m)   Wt 198 lb (89.8 kg)   BMI 32.95 kg/m    ASSESSMENT & PLAN:    1.  CAD status post stent intervention to the ramus intermedius and RCA in 2011 followed by NSTEMI in 2018 status post DES to the distal circumflex.  LVEF 65 to 70%.  No angina at this time.  Recent hospital stay reviewed.  Continue medical therapy including aspirin, Toprol-XL, Crestor, and as needed nitroglycerin.   2.  Paroxysmal atrial fibrillation with CHA2DS2-VASc score of 7.  Medtronic ILR in place with follow-up by Dr. Ladona Ridgel.  No referral atrial fibrillation documented by recent interrogation.  She remains on Xarelto for stroke prophylaxis.   3.  Mixed hyperlipidemia, on Crestor.  LDL 50 in December 2023.   4.  CKD stage IIIb, recent creatinine 1.37 with GFR 37.   5.  Primary hypertension.  Blood pressure today reported at 130/85.  No changes made.   6.  History of stroke in December 2023, likely embolic based on neuroimaging.  Continue stroke prophylaxis in the setting of paroxysmal atrial fibrillation.    Time:   Today, I have spent 10 minutes with the patient with telehealth technology discussing the above problems.    Follow Up:    2 to 3 months.  Signed, Nona Dell, MD  08/21/2023 9:56 AM    [facility]

## 2023-08-21 NOTE — Telephone Encounter (Signed)
  Patient Consent for Virtual Visit    [name] has provided verbal consent on 08/21/2023 for a virtual visit (video or telephone).   CONSENT FOR VIRTUAL VISIT FOR:  [name]  By participating in this virtual visit I agree to the following:  I hereby voluntarily request, consent and authorize [facility] and its employed or contracted physicians, physician assistants, nurse practitioners or other licensed health care professionals (the Practitioner), to provide me with telemedicine health care services (the "Services") as deemed necessary by the treating Practitioner. I acknowledge and consent to receive the Services by the Practitioner via telemedicine. I understand that the telemedicine visit will involve communicating with the Practitioner through live audiovisual communication technology and the disclosure of certain medical information by electronic transmission. I acknowledge that I have been given the opportunity to request an in-person assessment or other available alternative prior to the telemedicine visit and am voluntarily participating in the telemedicine visit.  I understand that I have the right to withhold or withdraw my consent to the use of telemedicine in the course of my care at any time, without affecting my right to future care or treatment, and that the Practitioner or I may terminate the telemedicine visit at any time. I understand that I have the right to inspect all information obtained and/or recorded in the course of the telemedicine visit and may receive copies of available information for a reasonable fee.  I understand that some of the potential risks of receiving the Services via telemedicine include:  Delay or interruption in medical evaluation due to technological equipment failure or disruption; Information transmitted may not be sufficient (e.g. poor resolution of images) to allow for appropriate medical decision making by the Practitioner;  and/or  In rare instances, security protocols could fail, causing a breach of personal health information.  Furthermore, I acknowledge that it is my responsibility to provide information about my medical history, conditions and care that is complete and accurate to the best of my ability. I acknowledge that Practitioner's advice, recommendations, and/or decision may be based on factors not within their control, such as incomplete or inaccurate data provided by me or distortions of diagnostic images or specimens that may result from electronic transmissions. I understand that the practice of medicine is not an exact science and that Practitioner makes no warranties or guarantees regarding treatment outcomes. I acknowledge that a copy of this consent can be made available to me via my patient portal [facility] MyChart), or I can request a printed copy by calling the office of  [facility].    I understand that my insurance will be billed for this visit.   I have read or had this consent read to me. I understand the contents of this consent, which adequately explains the benefits and risks of the Services being provided via telemedicine.  I have been provided ample opportunity to ask questions regarding this consent and the Services and have had my questions answered to my satisfaction. I give my informed consent for the services to be provided through the use of telemedicine in my medical care

## 2023-08-21 NOTE — Patient Instructions (Signed)
Medication Instructions:  Your physician recommends that you continue on your current medications as directed. Please refer to the Current Medication list given to you today.  *If you need a refill on your cardiac medications before your next appointment, please call your pharmacy*   Lab Work: None If you have labs (blood work) drawn today and your tests are completely normal, you will receive your results only by: MyChart Message (if you have MyChart) OR A paper copy in the mail If you have any lab test that is abnormal or we need to change your treatment, we will call you to review the results.   Testing/Procedures: None   Follow-Up: At [facility], you and your health needs are our priority.  As part of our continuing mission to provide you with exceptional heart care, we have created designated Provider Care Teams.  These Care Teams include your primary Cardiologist (physician) and Advanced Practice Providers (APPs -  Physician Assistants and Nurse Practitioners) who all work together to provide you with the care you need, when you need it.  We recommend signing up for the patient portal called "MyChart".  Sign up information is provided on this After Visit Summary.  MyChart is used to connect with patients for Virtual Visits (Telemedicine).  Patients are able to view lab/test results, encounter notes, upcoming appointments, etc.  Non-urgent messages can be sent to your provider as well.   To learn more about what you can do with MyChart, go to [email or web address].    Your next appointment:   2-3 month(s)  Provider:   You may see Nona Dell, MD or one of the following Advanced Practice Providers on your designated Care Team:   Randall An, PA-C  Jacolyn Reedy, [place]     Other Instructions

## 2023-08-27 ENCOUNTER — Telehealth: Payer: Self-pay | Admitting: Internal Medicine

## 2023-08-27 NOTE — Telephone Encounter (Signed)
Mailbox full

## 2023-08-27 NOTE — Telephone Encounter (Signed)
Daughter came by office on patient behalf.  Wants to have orders placed for barium swallow test Needed by Cala Bradford with  Hospice care [phone]  Daughter wants a call back.

## 2023-08-27 NOTE — Telephone Encounter (Signed)
Copied from CRM [id number]. Topic: Clinical - Request for Lab/Test Order >> Aug 27, 2023  9:46 AM Adelina Mings wrote: Reason for CRM: want to run a test on patient need doctor orders

## 2023-09-09 ENCOUNTER — Telehealth: Payer: Self-pay | Admitting: Cardiology

## 2023-09-09 MED ORDER — RIVAROXABAN 15 MG PO TABS: 15.0000 mg | ORAL_TABLET | Freq: Every day | ORAL | 0 refills | Status: DC

## 2023-09-09 MED ORDER — RIVAROXABAN 15 MG PO TABS: 15.0000 mg | ORAL_TABLET | Freq: Every day | ORAL | 2 refills | Status: DC

## 2023-09-09 NOTE — Telephone Encounter (Signed)
[name] (son) called asked to refax forms to [employer] [phone]. forms faxed and patient son [name] informed

## 2023-09-09 NOTE — Telephone Encounter (Signed)
Sample of Xarelto 15 mg provided to pt.

## 2023-09-09 NOTE — Telephone Encounter (Signed)
Pt last saw Dr Diona Browner 08/21/23, last labs 08/16/23 Creat 1.37, age 89, weight 89.8kg, CrCl 41.01, based on CrCl pt is on appropriate dosage of Xarelto 15mg  every day for afib.  Will refill rx.

## 2023-09-09 NOTE — Telephone Encounter (Signed)
*  STAT* If patient is at the pharmacy, call can be transferred to refill team.   1. Which medications need to be refilled? (please list name of each medication and dose if known)  Rivaroxaban (XARELTO) 15 MG TABS tablet    2. Would you like to learn more about the convenience, safety, & potential cost savings by using the [facility]? N/A   3. Are you open to using the [facility] (Type [facility]. N/A   4. Which pharmacy/location (including street and city if local pharmacy) is medication to be sent to? [id number] - , [place] - [address]    5. Do they need a 30 day or 90 day supply? 90 day  Patient is out of medication.

## 2023-09-09 NOTE — Telephone Encounter (Signed)
She has requested a refill on xarelto. She is completely out Insurance is pending she isn't able to pick it up yet

## 2023-09-10 ENCOUNTER — Telehealth: Payer: Self-pay | Admitting: Pharmacy Technician

## 2023-09-10 ENCOUNTER — Other Ambulatory Visit (HOSPITAL_COMMUNITY): Payer: Self-pay

## 2023-09-10 NOTE — Telephone Encounter (Signed)
 Pharmacy Patient Advocate Encounter   Received notification from CoverMyMeds that prior authorization for Xarelto  15mg  is required/requested.   Insurance verification completed.   The patient is insured through [employer] .   Per test claim: I ran the test claim through our pharmacy and I received a 0.00 copay for 30 days on the patient's insurance. I called the patient's pharmacy and asked them to try to reprocess the Xarelto . The pharmacy said they were told to process this prescription under a hospice plan so they processed the Xarelto  for only a 15 day supply and that was also a 0.00 charge. [facility] today.

## 2023-09-10 NOTE — Telephone Encounter (Signed)
Daughter cam by office has not heard back from anyone. Call Tobi Bastos at    (daughter) [phone].

## 2023-09-10 NOTE — Telephone Encounter (Signed)
Noted  

## 2023-09-12 NOTE — Telephone Encounter (Signed)
Spoke with patient's daughter.

## 2023-09-16 ENCOUNTER — Ambulatory Visit (INDEPENDENT_AMBULATORY_CARE_PROVIDER_SITE_OTHER)

## 2023-09-16 DIAGNOSIS — Z8673 Personal history of transient ischemic attack (TIA), and cerebral infarction without residual deficits: Secondary | ICD-10-CM

## 2023-09-17 LAB — CUP PACEART REMOTE DEVICE CHECK
Date Time Interrogation Session: 20250105231831
Implantable Pulse Generator Implant Date: 20240506

## 2023-10-15 ENCOUNTER — Telehealth: Payer: Self-pay | Admitting: Neurology

## 2023-10-15 NOTE — Telephone Encounter (Signed)
VM box full, sent mychart msg informing pt of cancellation of 02/10/24 appt - MD out

## 2023-10-21 ENCOUNTER — Ambulatory Visit (INDEPENDENT_AMBULATORY_CARE_PROVIDER_SITE_OTHER)

## 2023-10-21 DIAGNOSIS — I442 Atrioventricular block, complete: Secondary | ICD-10-CM | POA: Diagnosis not present

## 2023-10-21 LAB — CUP PACEART REMOTE DEVICE CHECK
Date Time Interrogation Session: 20250209231952
Implantable Pulse Generator Implant Date: 20240506

## 2023-10-23 NOTE — Telephone Encounter (Signed)
Spoke to [place] again, he will email over new forms to complete for Intermittent

## 2023-10-23 NOTE — Telephone Encounter (Unsigned)
Copied from CRM [id number]. Topic: General - Other >> Oct 23, 2023  1:48 PM Gildardo Pounds wrote: Reason for CRM: Patient's son, Alinda Money, is calling about his FMLA paperwork completed to assist with mother. Paperwork needs to be changed to intermittent instead of continuous. Callback number is [phone]

## 2023-10-24 ENCOUNTER — Other Ambulatory Visit: Payer: Self-pay | Admitting: Internal Medicine

## 2023-10-28 ENCOUNTER — Encounter: Payer: Self-pay | Admitting: Internal Medicine

## 2023-10-28 NOTE — Addendum Note (Signed)
Addended by: Geralyn Flash D on: 10/28/2023 12:19 PM   Modules accepted: Orders

## 2023-10-28 NOTE — Progress Notes (Signed)
 Carelink Summary Report / Loop Recorder

## 2023-10-31 ENCOUNTER — Telehealth: Payer: Self-pay | Admitting: Cardiology

## 2023-10-31 NOTE — Telephone Encounter (Signed)
Daughter Deshonna Trnka advised and verbalized understanding.  Daughter Malasia Torain will be available for the call.

## 2023-10-31 NOTE — Telephone Encounter (Signed)
Pt daughter called in stating pt has a Museum/gallery exhibitions officer with Dr. Diona Browner. She will not be able to be present during call but she asked if its okay if her sister speaks on behalf of the pt. She states pt is on a lot of medications and unable to speak much. Please advise.   She asked that you call her back at her work number and only ask for Tobi Bastos [phone]

## 2023-11-01 ENCOUNTER — Ambulatory Visit: Payer: Medicare HMO | Attending: Cardiology | Admitting: Cardiology

## 2023-11-01 ENCOUNTER — Encounter: Payer: Self-pay | Admitting: Cardiology

## 2023-11-01 DIAGNOSIS — I25119 Atherosclerotic heart disease of native coronary artery with unspecified angina pectoris: Secondary | ICD-10-CM

## 2023-11-01 DIAGNOSIS — N1832 Chronic kidney disease, stage 3b: Secondary | ICD-10-CM | POA: Diagnosis not present

## 2023-11-01 DIAGNOSIS — E782 Mixed hyperlipidemia: Secondary | ICD-10-CM | POA: Diagnosis not present

## 2023-11-01 DIAGNOSIS — I480 Paroxysmal atrial fibrillation: Secondary | ICD-10-CM | POA: Diagnosis not present

## 2023-11-01 NOTE — Progress Notes (Signed)
Virtual Visit via Telephone Note   Because of [name]'s co-morbid illnesses, she is at least at moderate risk for complications without adequate follow up.  This format is felt to be most appropriate for this patient at this time.  The patient did not have access to video technology/had technical difficulties with video requiring transitioning to audio format only (telephone).  All issues noted in this document were discussed and addressed.  No physical exam could be performed with this format.  Please refer to the patient's chart for her consent to telehealth for [facility].    Date:  11/01/2023   ID:  [name], DOB Feb 02, 1934, MRN [id number] The patient was identified using 2 identifiers.  Patient Location: Home Provider Location: Office/Clinic  Evaluation Performed:  Follow-Up Visit  Chief Complaint:  Cardiac follow-up  History of Present Illness:    [name] is a 89 y.o. female last seen in September 2024.  Today I communicated by phone with the patient's daughter who was at work at the time.  She spoke on behalf of her mother.  [name] is living at home with another daughter who is a Engineer, civil (consulting), also has home hospice care in place.  She has a feeding tube for most nutrition although was able to take some limited oral nutrition.  I reviewed her medications which have been simplified since our last encounter.  No longer on diuretic therapy or potassium supplement.  No obvious report of angina or nitroglycerin use.  She does remain on anticoagulant therapy.  ILR in place with follow-up with Dr. Ladona Ridgel.  Device interrogation earlier this month indicated no new atrial fibrillation episodes.  Labs/Other Tests and Data Reviewed:    EKG:  An ECG dated 06/21/2023 was personally reviewed today and demonstrated:  Sinus rhythm with incomplete right bundle branch block, left anterior fascicular block..  Recent Labs: 06/14/2023: Magnesium 1.9 06/21/2023: ALT 17; B  Natriuretic Peptide 43.0; BUN 29; Creatinine, Ser 2.07; Hemoglobin 11.3; Platelets 311; Potassium 4.2; Sodium 133   Recent Lipid Panel Lab Results  Component Value Date/Time   CHOL 103 08/21/2022 04:31 AM   CHOL 146 01/19/2015 10:48 AM   TRIG 58 08/21/2022 04:31 AM   HDL 41 08/21/2022 04:31 AM   HDL 48 01/19/2015 10:48 AM   CHOLHDL 2.5 08/21/2022 04:31 AM   LDLCALC 50 08/21/2022 04:31 AM   LDLCALC 77 01/19/2015 10:48 AM    Wt Readings from Last 3 Encounters:  08/21/23 198 lb (89.8 kg)  06/21/23 199 lb 8.3 oz (90.5 kg)  06/14/23 199 lb 8.3 oz (90.5 kg)       Objective:    Vital Signs:  No vital signs were obtained for this encounter.  ASSESSMENT & PLAN:    1.  CAD status post stent intervention to the ramus intermedius and RCA in 2011 followed by NSTEMI in 2018 status post DES to the distal circumflex.  LVEF 65 to 70%.  Patient lives with one of her daughters who is a Engineer, civil (consulting), also has home hospice in place.  Medications have been simplified.  Current cardiac regimen includes aspirin 81 mg daily, Toprol-XL 12.5 mg daily, Crestor 40 mg daily, and as needed nitroglycerin which she has not needed in the interim.   2.  Paroxysmal atrial fibrillation with CHA2DS2-VASc score of 7.  She is tolerating Xarelto 15 mg daily, no spontaneous bleeding problems reported.  ILR interrogation by Dr. Ladona Ridgel earlier this month did not indicate any new arrhythmia episodes.  3.  Mixed hyperlipidemia.  LDL 50 in December 2023.  Continue Crestor 40 mg daily.   4.  CKD stage IIIb, creatinine 2.07 in October 2024.   5.  Primary hypertension.  Continue hydralazine 25 mg twice daily.   6.  History of stroke in December 2023, likely embolic based on neuroimaging.   Time:   Today, I have spent 6 minutes with the patient with telehealth technology discussing the above problems.     Follow Up:  Virtual Visit   6 months.  Signed, Nona Dell, MD  11/01/2023 11:48 AM    [facility]

## 2023-11-01 NOTE — Patient Instructions (Addendum)
Medication Instructions:  Your physician recommends that you continue on your current medications as directed. Please refer to the Current Medication list given to you today.  Labwork: none  Testing/Procedures: none  Follow-Up: Your physician recommends that you schedule a follow-up appointment in: 6 months (phone visit)  Any Other Special Instructions Will Be Listed Below (If Applicable).  If you need a refill on your cardiac medications before your next appointment, please call your pharmacy.

## 2023-11-25 ENCOUNTER — Ambulatory Visit (INDEPENDENT_AMBULATORY_CARE_PROVIDER_SITE_OTHER)

## 2023-11-25 DIAGNOSIS — Z8673 Personal history of transient ischemic attack (TIA), and cerebral infarction without residual deficits: Secondary | ICD-10-CM | POA: Diagnosis not present

## 2023-11-26 LAB — CUP PACEART REMOTE DEVICE CHECK
Date Time Interrogation Session: 20250316231837
Implantable Pulse Generator Implant Date: 20240506

## 2023-11-29 NOTE — Progress Notes (Signed)
 Carelink Summary Report / Loop Recorder

## 2023-12-05 ENCOUNTER — Telehealth: Payer: Self-pay | Admitting: Cardiology

## 2023-12-05 NOTE — Telephone Encounter (Signed)
 Per Ruthe Mannan, daughter of patient and who is listed on DPR, patient currently resides in [place] with her other daughter due to decline in health. Per Tobi Bastos, it is taxing to get patient to appointments and she is wanting to know if the one year follow up visit in EP Clinic to evaluate ILR is necessary. Tobi Bastos reports that Dr. Ladona Ridgel knows patient personally and she wanted to know if he would come out to he house to do visit and just see how she is doing. Advised that one year appointment is needed since she has ILR and offered a virtual visit. Tobi Bastos explained that she is frustrated with our office because this message was sent to Dr. Ival Bible office in [place] and it was supposed to have been sent to [facility] where she wanted the appointment, if the appointment is needed. Apologized to Tobi Bastos and advised that message would be sent to provider to make final decision on whether or not one year f/u in EP Clinic is necessary. Tobi Bastos request that this message does not get sent to Dr. Ladona Ridgel and says she would call his personal phone in regards to this matter. Tobi Bastos declines scheduling an appointment at this time and says she will go to the [facility] Location and get this straightened out. Advised that provider will be copied on phone conversation as an [place] and apologized again to [place].

## 2023-12-05 NOTE — Telephone Encounter (Signed)
 Pts daughter calling about yearly F/U she would like to know if the appt is needed and what would take place at this appt. Please advise

## 2023-12-09 NOTE — Telephone Encounter (Signed)
 [name] Tobi Bastos is on the line requesting for a follow up. Per patient's daughter, "if the patient has to come to in office for an appointment, then the patient will have to arrive via ambulance". Patient daughter stated "since the patient is under hospice care at the home and this will be the only way she would be able to go." Please advise.

## 2023-12-11 ENCOUNTER — Telehealth: Payer: Self-pay | Admitting: *Deleted

## 2023-12-11 NOTE — Telephone Encounter (Signed)
 Spoke with patient Dtr in check out area to explain 1 year follow up is an annual visit and Loop recorder is being monitored remotely. Dtr was told that and PA or NP will see per follow up Recall. Dtr schedule 6-24 with [place].

## 2023-12-15 NOTE — Telephone Encounter (Signed)
 No need to come to the office.

## 2023-12-30 ENCOUNTER — Ambulatory Visit (INDEPENDENT_AMBULATORY_CARE_PROVIDER_SITE_OTHER)

## 2023-12-30 DIAGNOSIS — Z8673 Personal history of transient ischemic attack (TIA), and cerebral infarction without residual deficits: Secondary | ICD-10-CM

## 2023-12-31 LAB — CUP PACEART REMOTE DEVICE CHECK
Date Time Interrogation Session: 20250420232019
Implantable Pulse Generator Implant Date: 20240506

## 2024-01-02 ENCOUNTER — Encounter: Payer: Self-pay | Admitting: Internal Medicine

## 2024-01-09 ENCOUNTER — Ambulatory Visit: Payer: Medicare HMO | Admitting: Neurology

## 2024-01-13 NOTE — Addendum Note (Signed)
 Addended by: Edra Govern D on: 01/13/2024 03:24 PM   Modules accepted: Orders

## 2024-01-13 NOTE — Progress Notes (Signed)
 Carelink Summary Report / Loop Recorder

## 2024-01-21 ENCOUNTER — Telehealth: Payer: Self-pay | Admitting: Pharmacy Technician

## 2024-01-21 ENCOUNTER — Other Ambulatory Visit (HOSPITAL_COMMUNITY): Payer: Self-pay

## 2024-01-21 NOTE — Telephone Encounter (Signed)
 Received cmm to do a prior authorization for rosuvastatin  under hospice. Called [facility] and asked if they can run it under her regular insurance

## 2024-02-04 ENCOUNTER — Ambulatory Visit (INDEPENDENT_AMBULATORY_CARE_PROVIDER_SITE_OTHER)

## 2024-02-04 DIAGNOSIS — Z8673 Personal history of transient ischemic attack (TIA), and cerebral infarction without residual deficits: Secondary | ICD-10-CM | POA: Diagnosis not present

## 2024-02-05 LAB — CUP PACEART REMOTE DEVICE CHECK
Date Time Interrogation Session: 20250526233035
Implantable Pulse Generator Implant Date: 20240506

## 2024-02-06 ENCOUNTER — Ambulatory Visit: Payer: Self-pay | Admitting: Internal Medicine

## 2024-02-10 ENCOUNTER — Ambulatory Visit: Payer: Medicare HMO | Admitting: Neurology

## 2024-02-19 NOTE — Progress Notes (Signed)
 Carelink Summary Report / Loop Recorder

## 2024-02-19 NOTE — Addendum Note (Signed)
 Addended by: Edra Govern D on: 02/19/2024 11:23 AM   Modules accepted: Orders

## 2024-03-03 ENCOUNTER — Ambulatory Visit: Attending: Student | Admitting: Student

## 2024-03-03 NOTE — Progress Notes (Signed)
  Reviewed chart and prior discussion with Dr. Waddell.   No need for in person visit. She has had AF identified previously on loop.  She has been managed appropriately on Xarelto  with up to date annual labs in 08/2024. She remains in [facility]osp[facility] care and has difficulty travelling.    EP will continue to follow remotely via loop. Can do in person visits in [place] with Dr. Debera as she is able, or telephone visits with labs as previously done.   Discussed above with her daughter, [name], who was very appreciative of the call, and not having to arrange transport.   [name] Jodie Passey, PA-C  03/03/2024 9:04 AM

## 2024-03-05 ENCOUNTER — Ambulatory Visit (INDEPENDENT_AMBULATORY_CARE_PROVIDER_SITE_OTHER)

## 2024-03-05 DIAGNOSIS — Z8673 Personal history of transient ischemic attack (TIA), and cerebral infarction without residual deficits: Secondary | ICD-10-CM | POA: Diagnosis not present

## 2024-03-05 LAB — CUP PACEART REMOTE DEVICE CHECK
Date Time Interrogation Session: 20250625233335
Implantable Pulse Generator Implant Date: 20240506

## 2024-03-08 ENCOUNTER — Ambulatory Visit: Payer: Self-pay | Admitting: Internal Medicine

## 2024-03-23 ENCOUNTER — Emergency Department (HOSPITAL_COMMUNITY)

## 2024-03-23 ENCOUNTER — Encounter (HOSPITAL_COMMUNITY): Payer: Self-pay | Admitting: Emergency Medicine

## 2024-03-23 ENCOUNTER — Inpatient Hospital Stay (HOSPITAL_COMMUNITY)
Admission: EM | Admit: 2024-03-23 | Discharge: 2024-04-03 | DRG: 871 | Disposition: A | Discharge status: Hospice | Attending: Student | Admitting: Student

## 2024-03-23 DIAGNOSIS — M4628 Osteomyelitis of vertebra, sacral and sacrococcygeal region: Secondary | ICD-10-CM | POA: Diagnosis present

## 2024-03-23 DIAGNOSIS — A419 Sepsis, unspecified organism: Principal | ICD-10-CM | POA: Diagnosis present

## 2024-03-23 DIAGNOSIS — Z8619 Personal history of other infectious and parasitic diseases: Secondary | ICD-10-CM

## 2024-03-23 DIAGNOSIS — D631 Anemia in chronic kidney disease: Secondary | ICD-10-CM | POA: Diagnosis present

## 2024-03-23 DIAGNOSIS — N179 Acute kidney failure, unspecified: Secondary | ICD-10-CM | POA: Diagnosis present

## 2024-03-23 DIAGNOSIS — Z993 Dependence on wheelchair: Secondary | ICD-10-CM

## 2024-03-23 DIAGNOSIS — Z885 Allergy status to narcotic agent status: Secondary | ICD-10-CM

## 2024-03-23 DIAGNOSIS — E639 Nutritional deficiency, unspecified: Secondary | ICD-10-CM | POA: Diagnosis present

## 2024-03-23 DIAGNOSIS — B9620 Unspecified Escherichia coli [E. coli] as the cause of diseases classified elsewhere: Secondary | ICD-10-CM | POA: Diagnosis present

## 2024-03-23 DIAGNOSIS — R509 Fever, unspecified: Secondary | ICD-10-CM | POA: Diagnosis not present

## 2024-03-23 DIAGNOSIS — E871 Hypo-osmolality and hyponatremia: Secondary | ICD-10-CM | POA: Diagnosis present

## 2024-03-23 DIAGNOSIS — Z8673 Personal history of transient ischemic attack (TIA), and cerebral infarction without residual deficits: Secondary | ICD-10-CM

## 2024-03-23 DIAGNOSIS — Z1152 Encounter for screening for COVID-19: Secondary | ICD-10-CM

## 2024-03-23 DIAGNOSIS — R6520 Severe sepsis without septic shock: Secondary | ICD-10-CM | POA: Diagnosis present

## 2024-03-23 DIAGNOSIS — Z66 Do not resuscitate: Secondary | ICD-10-CM | POA: Diagnosis present

## 2024-03-23 DIAGNOSIS — Z888 Allergy status to other drugs, medicaments and biological substances status: Secondary | ICD-10-CM

## 2024-03-23 DIAGNOSIS — L89154 Pressure ulcer of sacral region, stage 4: Secondary | ICD-10-CM | POA: Diagnosis present

## 2024-03-23 DIAGNOSIS — I5032 Chronic diastolic (congestive) heart failure: Secondary | ICD-10-CM | POA: Diagnosis present

## 2024-03-23 DIAGNOSIS — E8809 Other disorders of plasma-protein metabolism, not elsewhere classified: Secondary | ICD-10-CM | POA: Diagnosis present

## 2024-03-23 DIAGNOSIS — I480 Paroxysmal atrial fibrillation: Secondary | ICD-10-CM | POA: Diagnosis present

## 2024-03-23 DIAGNOSIS — I130 Hypertensive heart and chronic kidney disease with heart failure and stage 1 through stage 4 chronic kidney disease, or unspecified chronic kidney disease: Secondary | ICD-10-CM | POA: Diagnosis present

## 2024-03-23 DIAGNOSIS — I2510 Atherosclerotic heart disease of native coronary artery without angina pectoris: Secondary | ICD-10-CM | POA: Diagnosis present

## 2024-03-23 DIAGNOSIS — A0472 Enterocolitis due to Clostridium difficile, not specified as recurrent: Secondary | ICD-10-CM | POA: Diagnosis present

## 2024-03-23 DIAGNOSIS — Z7982 Long term (current) use of aspirin: Secondary | ICD-10-CM

## 2024-03-23 DIAGNOSIS — N1832 Chronic kidney disease, stage 3b: Secondary | ICD-10-CM | POA: Diagnosis present

## 2024-03-23 DIAGNOSIS — E66811 Obesity, class 1: Secondary | ICD-10-CM | POA: Diagnosis present

## 2024-03-23 DIAGNOSIS — Z515 Encounter for palliative care: Secondary | ICD-10-CM | POA: Diagnosis not present

## 2024-03-23 DIAGNOSIS — Z79899 Other long term (current) drug therapy: Secondary | ICD-10-CM

## 2024-03-23 DIAGNOSIS — I252 Old myocardial infarction: Secondary | ICD-10-CM

## 2024-03-23 DIAGNOSIS — Z7901 Long term (current) use of anticoagulants: Secondary | ICD-10-CM | POA: Diagnosis not present

## 2024-03-23 DIAGNOSIS — Z955 Presence of coronary angioplasty implant and graft: Secondary | ICD-10-CM

## 2024-03-23 DIAGNOSIS — R6521 Severe sepsis with septic shock: Secondary | ICD-10-CM | POA: Diagnosis not present

## 2024-03-23 DIAGNOSIS — K5900 Constipation, unspecified: Secondary | ICD-10-CM | POA: Diagnosis present

## 2024-03-23 DIAGNOSIS — Z931 Gastrostomy status: Secondary | ICD-10-CM | POA: Diagnosis not present

## 2024-03-23 DIAGNOSIS — N390 Urinary tract infection, site not specified: Secondary | ICD-10-CM | POA: Diagnosis present

## 2024-03-23 DIAGNOSIS — Z9049 Acquired absence of other specified parts of digestive tract: Secondary | ICD-10-CM

## 2024-03-23 DIAGNOSIS — B952 Enterococcus as the cause of diseases classified elsewhere: Secondary | ICD-10-CM | POA: Diagnosis present

## 2024-03-23 DIAGNOSIS — Z7401 Bed confinement status: Secondary | ICD-10-CM

## 2024-03-23 DIAGNOSIS — Z8249 Family history of ischemic heart disease and other diseases of the circulatory system: Secondary | ICD-10-CM

## 2024-03-23 DIAGNOSIS — Z6830 Body mass index (BMI) 30.0-30.9, adult: Secondary | ICD-10-CM

## 2024-03-23 DIAGNOSIS — G4730 Sleep apnea, unspecified: Secondary | ICD-10-CM | POA: Diagnosis present

## 2024-03-23 DIAGNOSIS — Z91041 Radiographic dye allergy status: Secondary | ICD-10-CM

## 2024-03-23 DIAGNOSIS — E782 Mixed hyperlipidemia: Secondary | ICD-10-CM | POA: Diagnosis present

## 2024-03-23 DIAGNOSIS — A498 Other bacterial infections of unspecified site: Secondary | ICD-10-CM | POA: Diagnosis not present

## 2024-03-23 DIAGNOSIS — M1990 Unspecified osteoarthritis, unspecified site: Secondary | ICD-10-CM | POA: Diagnosis present

## 2024-03-23 LAB — CBC WITH DIFFERENTIAL/PLATELET
Abs Immature Granulocytes: 0.04 K/uL (ref 0.00–0.07)
Basophils Absolute: 0 K/uL (ref 0.0–0.1)
Basophils Relative: 0 %
Eosinophils Absolute: 0.1 K/uL (ref 0.0–0.5)
Eosinophils Relative: 1 %
HCT: 27.2 % — ABNORMAL LOW (ref 36.0–46.0)
Hemoglobin: 8.5 g/dL — ABNORMAL LOW (ref 12.0–15.0)
Immature Granulocytes: 0 %
Lymphocytes Relative: 17 %
Lymphs Abs: 2.2 K/uL (ref 0.7–4.0)
MCH: 25.5 pg — ABNORMAL LOW (ref 26.0–34.0)
MCHC: 31.3 g/dL (ref 30.0–36.0)
MCV: 81.7 fL (ref 80.0–100.0)
Monocytes Absolute: 0.9 K/uL (ref 0.1–1.0)
Monocytes Relative: 8 %
Neutro Abs: 9.2 K/uL — ABNORMAL HIGH (ref 1.7–7.7)
Neutrophils Relative %: 74 %
Platelets: 493 K/uL — ABNORMAL HIGH (ref 150–400)
RBC: 3.33 MIL/uL — ABNORMAL LOW (ref 3.87–5.11)
RDW: 16.6 % — ABNORMAL HIGH (ref 11.5–15.5)
WBC: 12.4 K/uL — ABNORMAL HIGH (ref 4.0–10.5)
nRBC: 0 % (ref 0.0–0.2)

## 2024-03-23 LAB — RESP PANEL BY RT-PCR (RSV, FLU A&B, COVID)  RVPGX2
Influenza A by PCR: NEGATIVE
Influenza B by PCR: NEGATIVE
Resp Syncytial Virus by PCR: NEGATIVE
SARS Coronavirus 2 by RT PCR: NEGATIVE

## 2024-03-23 LAB — PROTIME-INR
INR: 1.3 — ABNORMAL HIGH (ref 0.8–1.2)
Prothrombin Time: 16.9 s — ABNORMAL HIGH (ref 11.4–15.2)

## 2024-03-23 LAB — URINALYSIS, W/ REFLEX TO CULTURE (INFECTION SUSPECTED)
Bilirubin Urine: NEGATIVE
Bilirubin Urine: NEGATIVE
Glucose, UA: NEGATIVE mg/dL
Glucose, UA: NEGATIVE mg/dL
Ketones, ur: NEGATIVE mg/dL
Ketones, ur: NEGATIVE mg/dL
Nitrite: NEGATIVE
Nitrite: NEGATIVE
Protein, ur: 100 mg/dL — AB
Protein, ur: 100 mg/dL — AB
RBC / HPF: >50 RBC/hpf (ref 0–5)
Specific Gravity, Urine: 1.012 (ref 1.005–1.030)
Specific Gravity, Urine: 1.012 (ref 1.005–1.030)
Squamous Epithelial / HPF: >50 /HPF (ref 0–5)
WBC, UA: >50 WBC/hpf (ref 0–5)
pH: 6 (ref 5.0–8.0)
pH: 6 (ref 5.0–8.0)

## 2024-03-23 LAB — COMPREHENSIVE METABOLIC PANEL WITH GFR
ALT: 16 U/L (ref 0–44)
AST: 33 U/L (ref 15–41)
Albumin: 1.6 g/dL — ABNORMAL LOW (ref 3.5–5.0)
Alkaline Phosphatase: 89 U/L (ref 38–126)
Anion gap: 8 (ref 5–15)
BUN: 57 mg/dL — ABNORMAL HIGH (ref 8–23)
CO2: 23 mmol/L (ref 22–32)
Calcium: 11.5 mg/dL — ABNORMAL HIGH (ref 8.9–10.3)
Chloride: 101 mmol/L (ref 98–111)
Creatinine, Ser: 2.55 mg/dL — ABNORMAL HIGH (ref 0.44–1.00)
GFR, Estimated: 18 mL/min — ABNORMAL LOW (ref >60)
Glucose, Bld: 133 mg/dL — ABNORMAL HIGH (ref 70–99)
Potassium: 5.1 mmol/L (ref 3.5–5.1)
Sodium: 132 mmol/L — ABNORMAL LOW (ref 135–145)
Total Bilirubin: 0.7 mg/dL (ref 0.0–1.2)
Total Protein: 6.5 g/dL (ref 6.5–8.1)

## 2024-03-23 LAB — CBG MONITORING, ED: Glucose-Capillary: 98 mg/dL (ref 70–99)

## 2024-03-23 LAB — MAGNESIUM: Magnesium: 2.1 mg/dL (ref 1.7–2.4)

## 2024-03-23 LAB — I-STAT CG4 LACTIC ACID, ED: Lactic Acid, Venous: 1.5 mmol/L (ref 0.5–1.9)

## 2024-03-23 LAB — CALCIUM: Calcium: 10.8 mg/dL — ABNORMAL HIGH (ref 8.9–10.3)

## 2024-03-23 MED ORDER — ACETAMINOPHEN 325 MG PO TABS
650.0000 mg | ORAL_TABLET | Freq: Four times a day (QID) | ORAL | Status: DC | PRN
  Administered 2024-03-26 – 2024-03-31 (×4): 650 mg via ORAL
  Filled 2024-03-23 (×4): qty 2

## 2024-03-23 MED ORDER — LACTATED RINGERS IV SOLN: INTRAVENOUS | Status: DC

## 2024-03-23 MED ORDER — MORPHINE SULFATE (CONCENTRATE) 10 MG /0.5 ML PO SOLN
10.0000 mg | ORAL | Status: AC | PRN
  Administered 2024-03-23: 10 mg
  Filled 2024-03-23: qty 0.5

## 2024-03-23 MED ORDER — MORPHINE SULFATE (CONCENTRATE) 10 MG /0.5 ML PO SOLN
20.0000 mg | Freq: Once | ORAL | Status: AC | PRN
  Administered 2024-03-23: 20 mg
  Filled 2024-03-23 (×3): qty 1

## 2024-03-23 MED ORDER — SODIUM CHLORIDE 0.9 % IV SOLN
1.0000 g | Freq: Once | INTRAVENOUS | Status: AC
  Administered 2024-03-23: 1 g via INTRAVENOUS
  Filled 2024-03-23: qty 20

## 2024-03-23 MED ORDER — PANTOPRAZOLE SODIUM 40 MG IV SOLR
40.0000 mg | Freq: Two times a day (BID) | INTRAVENOUS | Status: DC
  Administered 2024-03-23 – 2024-03-24 (×3): 40 mg via INTRAVENOUS
  Filled 2024-03-23 (×3): qty 10

## 2024-03-23 MED ORDER — ACETAMINOPHEN 650 MG RE SUPP: 650.0000 mg | Freq: Four times a day (QID) | RECTAL | Status: DC | PRN

## 2024-03-23 MED ORDER — SODIUM CHLORIDE 0.9 % IV BOLUS
500.0000 mL | Freq: Once | INTRAVENOUS | Status: AC
  Administered 2024-03-23: 500 mL via INTRAVENOUS

## 2024-03-23 MED ORDER — CALCITONIN (SALMON) 200 UNIT/ML IJ SOLN
4.0000 [IU]/kg | Freq: Two times a day (BID) | INTRAMUSCULAR | Status: DC
  Administered 2024-03-23 – 2024-03-24 (×2): 312 [IU] via SUBCUTANEOUS
  Filled 2024-03-23 (×5): qty 1.56

## 2024-03-23 MED ORDER — ALBUMIN HUMAN 25 % IV SOLN
12.5000 g | Freq: Four times a day (QID) | INTRAVENOUS | Status: AC
  Administered 2024-03-23 (×2): 12.5 g via INTRAVENOUS
  Filled 2024-03-23 (×2): qty 50

## 2024-03-23 MED ORDER — ONDANSETRON HCL 4 MG/2ML IJ SOLN
4.0000 mg | Freq: Once | INTRAMUSCULAR | Status: AC
  Administered 2024-03-23: 4 mg via INTRAVENOUS
  Filled 2024-03-23: qty 2

## 2024-03-23 MED ORDER — ACETAMINOPHEN 160 MG/5ML PO SOLN
650.0000 mg | Freq: Once | ORAL | Status: AC
  Administered 2024-03-23: 650 mg
  Filled 2024-03-23: qty 20.3

## 2024-03-23 MED ORDER — SODIUM CHLORIDE 0.9 % IV SOLN
1.0000 g | Freq: Two times a day (BID) | INTRAVENOUS | Status: DC
  Administered 2024-03-23 – 2024-03-25 (×4): 1 g via INTRAVENOUS
  Filled 2024-03-23 (×5): qty 20

## 2024-03-23 MED ORDER — VANCOMYCIN VARIABLE DOSE PER UNSTABLE RENAL FUNCTION (PHARMACIST DOSING): Status: DC

## 2024-03-23 MED ORDER — SODIUM CHLORIDE 0.9 % IV SOLN: INTRAVENOUS | Status: AC

## 2024-03-23 MED ORDER — VANCOMYCIN HCL IN DEXTROSE 1-5 GM/200ML-% IV SOLN
1000.0000 mg | Freq: Once | INTRAVENOUS | Status: AC
  Administered 2024-03-23: 1000 mg via INTRAVENOUS
  Filled 2024-03-23: qty 200

## 2024-03-23 MED ORDER — CHLORHEXIDINE GLUCONATE CLOTH 2 % EX PADS
6.0000 | MEDICATED_PAD | Freq: Every day | CUTANEOUS | Status: DC
  Administered 2024-03-24 – 2024-04-02 (×10): 6 via TOPICAL

## 2024-03-23 MED ORDER — LACTATED RINGERS IV BOLUS (SEPSIS)
1000.0000 mL | Freq: Once | INTRAVENOUS | Status: AC
  Administered 2024-03-23: 1000 mL via INTRAVENOUS

## 2024-03-23 MED ORDER — LACTATED RINGERS IV SOLN
150.0000 mL/h | INTRAVENOUS | Status: DC
  Administered 2024-03-23: 150 mL/h via INTRAVENOUS

## 2024-03-23 MED ORDER — JEVITY 1.5 CAL/FIBER PO LIQD
1000.0000 mL | ORAL | Status: DC
  Administered 2024-03-23 – 2024-04-02 (×11): 1000 mL
  Filled 2024-03-23 (×18): qty 1000

## 2024-03-23 MED ORDER — VANCOMYCIN HCL IN DEXTROSE 1-5 GM/200ML-% IV SOLN: 1000.0000 mg | Freq: Once | INTRAVENOUS | Status: DC

## 2024-03-23 MED ORDER — FREE WATER
100.0000 mL | Status: DC
  Administered 2024-03-23 – 2024-03-24 (×4): 100 mL

## 2024-03-23 NOTE — H&P (Signed)
 History and Physical    Patient: [name] FMW:[id number] DOB: 1934/03/18 DOA: 03/23/2024 DOS: the patient was seen and examined on 03/23/2024 . PCP: Pcp, No  Patient coming from: Home Chief complaint: Chief Complaint  Patient presents with   Fever   HPI:  [name] is a 89 y.o. female with past medical history  of  hypertension, hyperlipidemia, nausea, CKD stage III, coronary artery disease, and stroke brought by daughter today for fever that started this morning.  Patient at bedside is lethargic moaning in pain does not open eyes when spoke to her follow commands.  Per chart review patient is at home hospice currently, daughter at bedside states that we want mom's CODE STATUS to be DO NOT INTUBATE.  At home patient is n.p.o. receives Jevity feeds through her G-tube.  ED Course:  Vital signs in the ED were notable for the following:  Vitals:   03/23/24 1445 03/23/24 1601 03/23/24 1800 03/23/24 1930  BP: (!) 105/92  (!) 111/57 (!) 131/57  Pulse: 81  74 82  Temp:  (!) 96.4 F (35.8 C)    Resp: 17  19 16   Weight:      SpO2: 100%  100% 100%  TempSrc:  Axillary    >>ED evaluation thus far shows: CMP shows sodium 132 glucose 133 BUN 57 creatinine 2.5 calcium  11.5 albumin  1.6 GFR 18. CBC shows white count of 12.4 hemoglobin of 8.5 platelets 493. PT/INR of 16.9 and 1.3. Viral panel negative for flu RSV and COVID. Urinalysis with more than 50 WBCs more than 50 RBC many bacteria large leukocytes nitrite negative turbid urine.  CT abdomen pelvis shows: Sacrococcygeal spine wound and osteomyelitis, G-tube in place, abnormality in the bladder concerning for hematuria urinalysis shows RBCs, please see complete report below.  >>While in the ED patient received the following: Medications  lactated ringers  bolus 1,000 mL (0 mLs Intravenous Stopped 03/23/24 0937)  acetaminophen  (TYLENOL ) 160 MG/5ML solution 650 mg (650 mg Per Tube Given 03/23/24 0814)  vancomycin  (VANCOCIN ) IVPB 1000  mg/200 mL premix (0 mg Intravenous Stopped 03/23/24 0908)    Followed by  vancomycin  (VANCOCIN ) IVPB 1000 mg/200 mL premix (0 mg Intravenous Stopped 03/23/24 1044)  meropenem  (MERREM ) 1 g in sodium chloride  0.9 % 100 mL IVPB (0 mg Intravenous Stopped 03/23/24 0946)  morphine  CONCENTRATE 10 mg / 0.5 ml oral solution 20 mg (20 mg Per Tube Given 03/23/24 1045)  lactated ringers  bolus 1,000 mL (0 mLs Intravenous Stopped 03/23/24 1147)    And  lactated ringers  bolus 1,000 mL (1,000 mLs Intravenous New Bag/Given 03/23/24 1200)  ondansetron  (ZOFRAN ) injection 4 mg (4 mg Intravenous Given 03/23/24 1116)   Review of Systems  Unable to perform ROS: Acuity of condition   Past Medical History:  Diagnosis Date   Arthritis    CKD (chronic kidney disease), stage III (HCC)    Complete heart block (HCC) 11/16/2016   a. transient during NSTEMI, resolved with revascularization.   Coronary artery disease 11/2009   a. with prior stent placement to the ramus intermedius and RCA. b. NSTEMI 11/2016 s/p DES to DES to distal Cx with moderate residual dz.   CVA (cerebral vascular accident) [facility])    Essential hypertension    Mixed hyperlipidemia    Non-ST elevation (NSTEMI) myocardial infarction (HCC) 11/16/2016   Obesity    Osteoarthritis    Reflux esophagitis    Sleep apnea 09/27/2010   Untreated, REM 64.7/hr AHI 18.5/hr RDI 19.2/hr. Patuent refused CPAP therapy  Past Surgical History:  Procedure Laterality Date   CHOLECYSTECTOMY     CORONARY ANGIOPLASTY WITH STENT PLACEMENT  2007   A 3.0x95mm CYPHER stent post dilated to 3.29 mm the 100% occlusion was reduced to 0%   CORONARY STENT INTERVENTION N/A 11/16/2016   Procedure: Coronary Stent Intervention;  Surgeon: Ozell Fell, MD;  Location: [facility] INVASIVE CV LAB;  Service: Cardiovascular;  Laterality: N/A;   LEFT HEART CATH AND CORONARY ANGIOGRAPHY N/A 11/16/2016   Procedure: Left Heart Cath and Coronary Angiography;  Surgeon: Ozell Fell, MD;  Location: [facility]  INVASIVE CV LAB;  Service: Cardiovascular;  Laterality: N/A;   LEFT HEART CATHETERIZATION WITH CORONARY ANGIOGRAM N/A 07/12/2014   Procedure: LEFT HEART CATHETERIZATION WITH CORONARY ANGIOGRAM;  Surgeon: Ozell JONETTA Fell, MD;  Location: [facility] CATH LAB;  Service: Cardiovascular;  Laterality: N/A;   TEMPORARY PACEMAKER N/A 11/16/2016   Procedure: Temporary Pacemaker;  Surgeon: Ozell Fell, MD;  Location: [facility] INVASIVE CV LAB;  Service: Cardiovascular;  Laterality: N/A;    reports that she has never smoked. She has never used smokeless tobacco. She reports that she does not drink alcohol and does not use drugs. Allergies  Allergen Reactions   Atorvastatin Nausea And Vomiting   Contrast Media [Iodinated Contrast Media] Other (See Comments)    Nausea/Vomiting and Shortness of Breath   Darvon [Propoxyphene] Nausea And Vomiting   Codeine Nausea And Vomiting and Anxiety   Family History  Problem Relation Age of Onset   Hypertension Mother    Aneurysm Mother        died at age 63   Other Father        unsure of medical history   Hypertension Other    Prior to Admission medications   Medication Sig Start Date End Date Taking? Authorizing Provider  acetaminophen  (TYLENOL ) 500 MG tablet Take 2 tablets (1,000 mg total) by mouth every 6 (six) hours as needed for mild pain or moderate pain. 03/25/23   Tobie Yetta HERO, MD  aspirin  EC 81 MG tablet Take 1 tablet (81 mg total) by mouth daily. Swallow whole. 04/05/23   Fenton, Clint R, PA  ergocalciferol  (VITAMIN D2) 1.25 MG (50000 UT) capsule Take 50,000 Units by mouth every 30 (thirty) days.    [provider]  hydrALAZINE  (APRESOLINE ) 25 MG tablet Take 1 tablet (25 mg total) by mouth in the morning and at bedtime. 11/21/22 11/16/23  Debera Jayson MATSU, MD  metoprolol  succinate (TOPROL -XL) 25 MG 24 hr tablet TAKE 1/2 TABLET BY MOUTH EVERY DAY 08/05/23   Miriam Norris, NP  Morphine  Sulfate (MORPHINE  CONCENTRATE) 10 mg / 0.5 ml concentrated solution Take  20 mg by mouth every 4 (four) hours as needed for severe pain (pain score 7-10). 08/17/23   [provider]  nitroGLYCERIN  (NITROSTAT ) 0.4 MG SL tablet PLACE 1 TABLET UNDER THE TONGUE EVERY 5 MINUTES AS NEEDED. 08/21/23   Debera Jayson MATSU, MD  Rivaroxaban  (XARELTO ) 15 MG TABS tablet Take 1 tablet (15 mg total) by mouth daily with supper. 09/09/23   Debera Jayson MATSU, MD  Rivaroxaban  (XARELTO ) 15 MG TABS tablet Take 1 tablet (15 mg total) by mouth daily with supper. 09/09/23   Debera Jayson MATSU, MD  rosuvastatin  (CRESTOR ) 40 MG tablet TAKE 1 TABLET BY MOUTH EVERY DAY Patient taking differently: Take 40 mg by mouth daily. 05/31/23   Debera Jayson MATSU, MD  Vitals:   03/23/24 1445 03/23/24 1601 03/23/24 1800 03/23/24 1930  BP: (!) 105/92  (!) 111/57 (!) 131/57  Pulse: 81  74 82  Resp: 17  19 16   Temp:  (!) 96.4 F (35.8 C)    TempSrc:  Axillary    SpO2: 100%  100% 100%  Weight:       Physical Exam Vitals reviewed.  Constitutional:      General: She is not in acute distress.    Appearance: She is ill-appearing.  HENT:     Head: Normocephalic.  Eyes:     Extraocular Movements: Extraocular movements intact.  Cardiovascular:     Rate and Rhythm: Normal rate and regular rhythm.     Heart sounds: Normal heart sounds.  Pulmonary:     Breath sounds: Normal breath sounds.  Abdominal:     General: There is no distension.     Palpations: Abdomen is soft.     Tenderness: There is no abdominal tenderness.  Neurological:     General: No focal deficit present.     Mental Status: She is alert and oriented to person, place, and time.     Labs on Admission: I have personally reviewed following labs and imaging studies CBC: Recent Labs  Lab 03/23/24 0748  WBC 12.4*  NEUTROABS 9.2*  HGB 8.5*  HCT 27.2*  MCV 81.7  PLT 493*   Basic Metabolic Panel: Recent Labs  Lab 03/23/24 0748 03/23/24 1655   NA 132*  --   K 5.1  --   CL 101  --   CO2 23  --   GLUCOSE 133*  --   BUN 57*  --   CREATININE 2.55*  --   CALCIUM  11.5* 10.8*  MG  --  2.1   GFR: CrCl cannot be calculated (Unknown ideal weight.). Liver Function Tests: Recent Labs  Lab 03/23/24 0748  AST 33  ALT 16  ALKPHOS 89  BILITOT 0.7  PROT 6.5  ALBUMIN  1.6*   No results for input(s): LIPASE, AMYLASE in the last 168 hours. No results for input(s): AMMONIA in the last 168 hours. Coagulation Profile: Recent Labs  Lab 03/23/24 0843  INR 1.3*   Cardiac Enzymes: No results for input(s): CKTOTAL, CKMB, CKMBINDEX, TROPONINI in the last 168 hours. BNP (last 3 results) No results for input(s): PROBNP in the last 8760 hours. HbA1C: No results for input(s): HGBA1C in the last 72 hours. CBG: Recent Labs  Lab 03/23/24 1702  GLUCAP 98   Lipid Profile: No results for input(s): CHOL, HDL, LDLCALC, TRIG, CHOLHDL, LDLDIRECT in the last 72 hours. Thyroid  Function Tests: No results for input(s): TSH, T4TOTAL, FREET4, T3FREE, THYROIDAB in the last 72 hours. Anemia Panel: No results for input(s): VITAMINB12, FOLATE, FERRITIN, TIBC, IRON , RETICCTPCT in the last 72 hours. Urine analysis:    Component Value Date/Time   COLORURINE YELLOW 03/23/2024 1103   APPEARANCEUR TURBID (A) 03/23/2024 1103   LABSPEC 1.012 03/23/2024 1103   PHURINE 6.0 03/23/2024 1103   GLUCOSEU NEGATIVE 03/23/2024 1103   HGBUR SMALL (A) 03/23/2024 1103   BILIRUBINUR NEGATIVE 03/23/2024 1103   KETONESUR NEGATIVE 03/23/2024 1103   PROTEINUR 100 (A) 03/23/2024 1103   UROBILINOGEN 1.0 07/11/2014 0914   NITRITE NEGATIVE 03/23/2024 1103   LEUKOCYTESUR LARGE (A) 03/23/2024 1103   Radiological Exams on Admission: CT ABDOMEN PELVIS WO CONTRAST Result Date: 03/23/2024 CLINICAL DATA:  Fever and sacral pain EXAM: CT ABDOMEN AND PELVIS WITHOUT CONTRAST TECHNIQUE: Multidetector CT imaging of the abdomen and  pelvis was performed following the standard protocol without IV contrast. RADIATION DOSE REDUCTION: This exam was performed according to the departmental dose-optimization program which includes automated exposure control, adjustment of the mA and/or kV according to patient size and/or use of iterative reconstruction technique. COMPARISON:  CT abdomen and pelvis dated 09/26/2018 FINDINGS: Decreased sensitivity and specificity for detailed findings due to motion artifact. Lower chest: Mild right lower lobe bronchiectasis. Subsegmental mucous plugging in the left lower lobe where there is compressive atelectasis. Questionable trace tree-in-bud nodules in the right lower lobe. Unchanged right lower lobe 5 mm pulmonary nodule (4:15). No specific follow-up imaging recommended. No pleural effusion or pneumothorax demonstrated. Partially imaged heart size is normal. Coronary artery calcifications. Hepatobiliary: No focal hepatic lesions. No intra or extrahepatic biliary ductal dilation. Trace pneumobilia, most notable in the left hepatic lobe, likely sequela prior sphincterotomy. Cholecystectomy. 7 mm rounded focus of hyperattenuation at the level of the ampulla (2:33). Pancreas: No focal lesions or main ductal dilation. Spleen: Normal in size without focal abnormality. Adrenals/Urinary Tract: No adrenal nodules. No suspicious renal mass on this noncontrast enhanced examination , calculi or hydronephrosis. Subtle layering hyperdensity within the right posterior urinary bladder. Stomach/Bowel: Moderate hiatal hernia. Percutaneous gastrostomy tube in-situ. The balloon is slightly retracted from the anterior gastric wall. No evidence of bowel wall thickening, distention, or inflammatory changes. Extensive colonic diverticulosis without acute diverticulitis. Large volume stool in the rectum. Appendix is not discretely seen. Vascular/Lymphatic: Aortic atherosclerosis. No enlarged abdominal or pelvic lymph nodes. Reproductive:  No adnexal masses. Other: No free fluid, fluid collection, or free air. Musculoskeletal: Multilevel degenerative changes of the partially imaged thoracic and lumbar spine. Diffuse subcutaneous soft tissue edema overlying the posterior lumbosacral region. Sacral wound overlying the distal sacrococcygeal spine with findings suspicious for erosive changes of the underlying bony cortex (2:70). IMPRESSION: 1. Sacral wound overlying the distal sacrococcygeal spine with findings suspicious for underlying osteomyelitis. 2. Subtle layering hyperdensity within the right posterior urinary bladder, which may represent blood products or debris. Recommend correlation with urinalysis. 3. Percutaneous gastrostomy tube in-situ. The balloon is slightly retracted from the anterior gastric wall. 4. Questionable trace tree-in-bud nodules in the right lower lobe, which may be infectious or inflammatory. 5. A 7 mm rounded hyperattenuating focus at the level of the ampulla, which appears new compared to 2020 may represent ingested material versus stone. The patient is unlikely to tolerate breath holding for MRCP. Recommend further evaluation with right upper quadrant ultrasound examination with dedicated evaluation of the ampulla. 6. Large volume stool in the rectum, which may reflect fecal impaction. 7. Aortic Atherosclerosis (ICD10-I70.0). Coronary artery calcifications. Assessment for potential risk factor modification, dietary therapy or pharmacologic therapy may be warranted, if clinically indicated. Electronically Signed   By: Limin  Xu M.D.   On: 03/23/2024 10:18   DG Chest 2 View Result Date: 03/23/2024 CLINICAL DATA:  Suspected sepsis. EXAM: CHEST - 2 VIEW COMPARISON:  08/05/2023. FINDINGS: Low lung volume. Mild atelectatic changes at the lung bases. Bilateral lung fields are otherwise clear. No acute consolidation or lung collapse. Bilateral costophrenic angles are clear. Stable cardio-mediastinal silhouette. Small to medium  retrocardiac hiatal hernia noted. No acute osseous abnormalities. The soft tissues are within normal limits. IMPRESSION: No active cardiopulmonary disease. Electronically Signed   By: Ree Molt M.D.   On: 03/23/2024 08:39   Data Reviewed: Relevant notes from primary care and specialist visits, past discharge summaries as available in EHR, including Care Everywhere . Prior diagnostic testing as pertinent to  current admission diagnoses, Updated medications and problem lists for reconciliation .ED course, including vitals, labs, imaging, treatment and response to treatment,Triage notes, nursing and pharmacy notes and ED provider's notes.Notable results as noted in HPI.Discussed case with EDMD/ ED APP/ or Specialty MD on call and as needed.  Assessment & Plan  >> Sepsis/sacral decubitus and osteomyelitis/UTI: Stage iv to unstageable sacral decub.Pt is bedbound and at home , lives with daughter.  Cont with vancomycin  and meropenem .  Foley and rectal tube to keep wound clean.  Wound consult ordered. Gen surgery as deemed appropriate.   >> Hypercalcemia: Current level of 11.5, albumin  of 1.6.corrected calcium  of 13.4.  Will monitor closely on stepdown unit.  Follow calcium  level q12h x 3.   >> Essential hypertension: Vitals:   03/23/24 1130 03/23/24 1145 03/23/24 1200 03/23/24 1201  BP: 113/74 113/60 122/65 122/65   03/23/24 1215 03/23/24 1230 03/23/24 1245 03/23/24 1300  BP: 136/70 (!) 149/76 127/72 130/66   03/23/24 1315 03/23/24 1445 03/23/24 1800 03/23/24 1930  BP: 115/82 (!) 105/92 (!) 111/57 (!) 131/57  Currently will hold her metoprolol  and follow blood pressures.     >> CAD status post PCI: Continue patient on her statin and metoprolol  after doing a swallow evaluation.   >> CKD stage IIIb: Renally dose medications avoid contrast. Lab Results  Component Value Date   CREATININE 2.55 (H) 03/23/2024   CREATININE 2.07 (H) 06/21/2023   CREATININE 2.03 (H) 06/19/2023    >>  History of CVA: Secondary to PAF continued on Xarelto   >> Paroxysmal A-fib: Patient is currently on Xarelto .   >> Chronic HFpEF: Strict I's and O's currently patient is hypovolemic we will monitor blood pressure cautious IV fluid resuscitation and rehydration as deemed appropriate with daily weights    DVT prophylaxis:  SCD;s  Consults:  None  Advance Care Planning:    Code Status: Full Code   Family Communication:  Daughter Disposition Plan:  To be determined Severity of Illness: The appropriate patient status for this patient is INPATIENT. Inpatient status is judged to be reasonable and necessary in order to provide the required intensity of service to ensure the patient's safety. The patient's presenting symptoms, physical exam findings, and initial radiographic and laboratory data in the context of their chronic comorbidities is felt to place them at high risk for further clinical deterioration. Furthermore, it is not anticipated that the patient will be medically stable for discharge from the hospital within 2 midnights of admission.   * I certify that at the point of admission it is my clinical judgment that the patient will require inpatient hospital care spanning beyond 2 midnights from the point of admission due to high intensity of service, high risk for further deterioration and high frequency of surveillance required.*  Unresulted Labs (From admission, onward)     Start     Ordered   03/24/24 0500  Protime-INR  Tomorrow morning,   R        03/23/24 1350   03/24/24 0500  Cortisol-am, blood  Tomorrow morning,   R        03/23/24 1350   03/24/24 0500  Comprehensive metabolic panel  Tomorrow morning,   R        03/23/24 1350   03/24/24 0500  CBC  Tomorrow morning,   R        03/23/24 1350   03/24/24 0500  Phosphorus  Tomorrow morning,   R        03/23/24 1350  03/23/24 1348  Calcium   Now then every 12 hours,   R (with TIMED occurrences)      03/23/24 1350    03/23/24 1347  Parathyroid  hormone, intact (no Ca)  Once,   R        03/23/24 1350   03/23/24 1103  Urine Culture  Once,   AD        03/23/24 1103   03/23/24 0748  Culture, blood (Routine x 2)  BLOOD CULTURE X 2,   R (with STAT occurrences)      03/23/24 0749            Meds ordered this encounter  Medications   lactated ringers  bolus 1,000 mL    Reason 30 mL/kg dose is not being ordered:   First Lactic Acid Pending   DISCONTD: vancomycin  (VANCOCIN ) IVPB 1000 mg/200 mL premix    Indication::   Other Indication (list below)    Other Indication::   Unknown Source.   acetaminophen  (TYLENOL ) 160 MG/5ML solution 650 mg   FOLLOWED BY Linked Order Group    vancomycin  (VANCOCIN ) IVPB 1000 mg/200 mL premix     Indication::   Sepsis    vancomycin  (VANCOCIN ) IVPB 1000 mg/200 mL premix     Indication::   Sepsis   meropenem  (MERREM ) 1 g in sodium chloride  0.9 % 100 mL IVPB    Antibiotic Indication::   ESBL Infection    Other Indication::   previous history, code sepsis called   morphine  CONCENTRATE 10 mg / 0.5 ml oral solution 20 mg    Refill:  0   AND Linked Order Group    lactated ringers  bolus 1,000 mL     Enter Patient Weight in Kilograms:   89    lactated ringers  bolus 1,000 mL     Enter Patient Weight in Kilograms:   89   ondansetron  (ZOFRAN ) injection 4 mg   sodium chloride  0.9 % bolus 500 mL   Chlorhexidine  Gluconate Cloth 2 % PADS 6 each   albumin  human 25 % solution 12.5 g   DISCONTD: lactated ringers  infusion   OR Linked Order Group    acetaminophen  (TYLENOL ) tablet 650 mg    acetaminophen  (TYLENOL ) suppository 650 mg   pantoprazole  (PROTONIX ) injection 40 mg   calcitonin (MIACALCIN ) injection 312 Units    Criteria for use (must meet all) - CorrCa >/= 14, or >/= 12 with AMS, receiving IV hydration with NS unless contraindicated, IV bisphosphonate x1 ordered, unless contraindicated or given in past 7 days.   D/C after: CorrCa < 14 without AMS, or CorrCa < 12, or 4 doses,  whichever occurs first.   0.9 %  sodium chloride  infusion   feeding supplement (JEVITY 1.5 CAL/FIBER) liquid 1,000 mL   free water  100 mL   morphine  CONCENTRATE 10 mg / 0.5 ml oral solution 10 mg    Refill:  0   DISCONTD: lactated ringers  infusion     Orders Placed This Encounter  Procedures   Critical Care   Culture, blood (Routine x 2)   Resp panel by RT-PCR (RSV, Flu A&B, Covid) Anterior Nasal Swab   Urine Culture   DG Chest 2 View   CT ABDOMEN PELVIS WO CONTRAST   Comprehensive metabolic panel   CBC with Differential   Urinalysis, w/ Reflex to Culture (Infection Suspected) -Urine, Catheterized   Protime-INR   Urinalysis, w/ Reflex to Culture (Infection Suspected)   Protime-INR   Cortisol-am, blood   Comprehensive metabolic panel  CBC   Calcium    Magnesium    Phosphorus   Parathyroid  hormone, intact (no Ca)   Diet NPO time specified Except for: Sips with Meds   Notify physician (specify)  Specify: Notify provider for possible Code Sepsis   Document height and weight   Assess and Document Glasgow Coma Scale   Document vital signs within 1-hour of fluid bolus completion. Notify provider of abnormal vital signs despite fluid resuscitation.   DO NOT delay antibiotics if unable to obtain blood culture.   Refer to Sidebar Report: Sepsis Sidebar ED/IP   Notify provider for difficulties obtaining IV access.   Insert peripheral IV x 2   Initiate Carrier Fluid Protocol   In and Out Cath   Refer to nursing sidebar and the Clinical Skills Nursing Procedures web link for further information on insertion, maintenance, and discontinuation criteria of the Indwelling Fecal Containment Device.   Insert indwelling fecal management system   Insert urethral catheter If Coude Catheter is chosen, qualified resources by campus can be found in the clinical skills nursing procedure for Coude Catheter, And on Amion under Urology.  If catheter irrigation is needed consider large-bore cathete...    Refer to Sidebar Report Urinary (Foley) Catheter Indications   Refer to Sidebar Report Post Indwelling Urinary Catheter Removal and Intervention Guidelines   Wound care   Cardiac Monitoring Continuous x 24 hours Indications for use: Other; other indications for use: Sepsis   Vital signs   Notify physician (specify)   Refer to Sidebar Report Refer to ICU, Med-Surg, Progressive, and Step-Down Mobility Protocol Sidebars   If lactate (lactic acid) >2, verify repeat lactic acid order has been placed to be drawn   Document vital signs within 1-hour of fluid bolus completion and notify provider of bolus completion   Vital signs   Vital signs   RN to call RRT (rapid response team)   Notify physician (specify) If patient in A-Fib, change in heart rhythm, or HR > 125 beat/min   Initiate Adult Central Line Maintenance and Catheter Protocol for patients with central line (CVC, PICC, Port, Hemodialysis, Trialysis)   Apply Sepsis Care Plan   Refer to Sidebar Report: Sepsis Bundle ED/IP   Assess and Document Glasgow Coma Scale   Initiate Oral Care Protocol   Initiate Carrier Fluid Protocol   RN may order General Admission PRN Orders utilizing General Admission PRN medications (through manage orders) for the following patient needs: allergy symptoms (Claritin), cold sores (Carmex), cough (Robitussin DM), eye irritation (Liquifilm Tears), hemorrhoids (Tucks), indigestion (Maalox), minor skin irritation (Hydrocortisone  Cream), muscle pain (Ben Gay), nose irritation (saline nasal spray) and sore throat (Chloraseptic spray).   Care order/instruction: PT IS DO NO INTUBATE, BUT WANTS CHEST COMPRESSION.   Bed rest   Strict intake and output   Swallow screen   Daily weights   Full code   Code Sepsis activation.  This occurs automatically when order is signed and prioritizes pharmacy, lab, and radiology services for STAT collections and interventions.  If [facility] downtime, call [facility] [phone]) to activate  Code Sepsis.   Consult to hospitalist   Consult to intensivist   Consult to hospitalist   Consult to Registered Dietitian   Pulse oximetry check with vital signs   Pulse oximetry (single)   CBG monitoring, ED   EKG 12-Lead   Admit to Inpatient (patient's expected length of stay will be greater than 2 midnights or inpatient only procedure)   Aspiration precautions   Fall precautions  Author: Mario LULLA Blanch, MD 12 pm- 8 pm. [facility]. 03/23/2024 8:01 PM Please note for any communication after hours contact [facility] Assigned provider on call on Amion.

## 2024-03-23 NOTE — ED Notes (Signed)
 Patient wound cleaned and packed, new sacral pad applied.

## 2024-03-23 NOTE — ED Notes (Signed)
 CCMD called. Pt placed on monitor.

## 2024-03-23 NOTE — ED Notes (Signed)
Provider at bedside talking with pts family.

## 2024-03-23 NOTE — Sepsis Progress Note (Signed)
 Elink will follow per sepsis protocol.

## 2024-03-23 NOTE — ED Notes (Signed)
 Patient is DNI, but would receive chest compressions, per patient daughter.

## 2024-03-23 NOTE — Hospital Course (Addendum)
 89 year old female with CAD w/ stents, paroxysmal atrial fibrillation, hyperlipidemia CKD 3 B, hypertension history of stroke in 2023, CHF with preserved EF, dysphagia and chronic ulcer of sacrum, anemia of kidney disease severe malnutrition and feeding tube currently at home with hospice brought from home with fever and body pain., discharge from wound from the sacral wound. In the ED sepsis protocol initiated-patient has been hypotensive after 63/44, labs with hyponatremia 132 BUN 57 creatinine 2.5 lactic acid 1.5 WBC 12.4, hemoglobin 8.5 UA unremarkable Chest x-ray: NAD CT abdomen pelvis without contrast> sacral wound overlying the distal sacral coccygeal spine suspicious for underlying osteomyelitis, hyperdensity layering on the urinary bladder rule out hematuria, G-tube in place right lower lobe ?  Tree-in-bud nodules, 7 mm hyperattenuating focus at ampulla new since 2020> advised right upper quadrant ultrasound, large volume stool in the rectum/fecal impaction.  Atherosclerotic disease. She is full code>EDP calling PCCM.

## 2024-03-23 NOTE — ED Provider Notes (Signed)
 [place] EMERGENCY DEPARTMENT AT [facility] Provider Note   CSN: [id number] Arrival date & time: 03/23/24  9264     Patient presents with: Fever   [name] is a 89 y.o. female.   HPI 89 year old Female presents from home with a fever.  History is primarily from the daughter at the bedside.  Patient developed a fever yesterday.  1 week ago she had her sacral decubitus wound scraped and is currently on antibiotics (patient's daughter is not sure which one).  Prior to Admission medications   Medication Sig Start Date End Date Taking? Authorizing Provider  acetaminophen  (TYLENOL ) 500 MG tablet Take 2 tablets (1,000 mg total) by mouth every 6 (six) hours as needed for mild pain or moderate pain. 03/25/23   Tobie Yetta CHRISTELLA, MD  aspirin  81 MG chewable tablet Chew 81 mg by mouth daily as needed for moderate pain (pain score 4-6). 10/20/23   [provider]  doxycycline (VIBRAMYCIN) 100 MG capsule Take 100 mg by mouth 2 (two) times daily. 03/17/24   [provider]  ergocalciferol  (VITAMIN D2) 1.25 MG (50000 UT) capsule Take 50,000 Units by mouth every 30 (thirty) days.    [provider]  hydrALAZINE  (APRESOLINE ) 25 MG tablet Take 1 tablet (25 mg total) by mouth in the morning and at bedtime. 11/21/22 11/16/23  Debera Jayson MATSU, MD  ipratropium-albuterol (DUONEB) 0.5-2.5 (3) MG/3ML SOLN Inhale 3 mLs into the lungs. 12/17/23   [provider]  LUMIGAN 0.01 % SOLN Place 1 drop into both eyes at bedtime. 12/12/23   [provider]  metoprolol  succinate (TOPROL -XL) 25 MG 24 hr tablet TAKE 1/2 TABLET BY MOUTH EVERY DAY 08/05/23   Miriam Norris, NP  Morphine  Sulfate (MORPHINE  CONCENTRATE) 10 mg / 0.5 ml concentrated solution Take 20 mg by mouth every 4 (four) hours as needed for severe pain (pain score 7-10). 08/17/23   [provider]  nitroGLYCERIN  (NITROSTAT ) 0.4 MG SL tablet PLACE 1 TABLET UNDER THE TONGUE EVERY 5 MINUTES AS NEEDED.  08/21/23   Debera Jayson MATSU, MD  Rivaroxaban  (XARELTO ) 15 MG TABS tablet Take 1 tablet (15 mg total) by mouth daily with supper. 09/09/23   Debera Jayson MATSU, MD  Rivaroxaban  (XARELTO ) 15 MG TABS tablet Take 1 tablet (15 mg total) by mouth daily with supper. 09/09/23   Debera Jayson MATSU, MD  rosuvastatin  (CRESTOR ) 40 MG tablet TAKE 1 TABLET BY MOUTH EVERY DAY Patient taking differently: Take 40 mg by mouth daily. 05/31/23   Debera Jayson MATSU, MD    Allergies: Atorvastatin, Contrast media [iodinated contrast media], Darvon [propoxyphene], and Codeine    Review of Systems  Updated Vital Signs BP (!) 105/92   Pulse 81   Temp 98.9 F (37.2 C) (Axillary)   Resp 17   Wt 78 kg   SpO2 100%   BMI 28.62 kg/m   Physical Exam Vitals and nursing note reviewed. Exam conducted with a chaperone present.  Constitutional:      Appearance: She is well-developed. She is not diaphoretic.  HENT:     Head: Normocephalic and atraumatic.  Cardiovascular:     Rate and Rhythm: Normal rate and regular rhythm.     Heart sounds: Normal heart sounds.  Pulmonary:     Effort: Pulmonary effort is normal.     Breath sounds: Normal breath sounds.  Abdominal:     General: There is no distension.     Palpations: Abdomen is soft.     Tenderness:  There is no abdominal tenderness.  Musculoskeletal:       Back:  Skin:    General: Skin is warm and dry.  Neurological:     Mental Status: She is alert.     (all labs ordered are listed, but only abnormal results are displayed) Labs Reviewed  COMPREHENSIVE METABOLIC PANEL WITH GFR - Abnormal; Notable for the following components:      Result Value   Sodium 132 (*)    Glucose, Bld 133 (*)    BUN 57 (*)    Creatinine, Ser 2.55 (*)    Calcium  11.5 (*)    Albumin  1.6 (*)    GFR, Estimated 18 (*)    All other components within normal limits  CBC WITH DIFFERENTIAL/PLATELET - Abnormal; Notable for the following components:   WBC 12.4 (*)    RBC 3.33 (*)     Hemoglobin 8.5 (*)    HCT 27.2 (*)    MCH 25.5 (*)    RDW 16.6 (*)    Platelets 493 (*)    Neutro Abs 9.2 (*)    All other components within normal limits  URINALYSIS, W/ REFLEX TO CULTURE (INFECTION SUSPECTED) - Abnormal; Notable for the following components:   APPearance TURBID (*)    Hgb urine dipstick SMALL (*)    Protein, ur 100 (*)    Leukocytes,Ua LARGE (*)    All other components within normal limits  PROTIME-INR - Abnormal; Notable for the following components:   Prothrombin Time 16.9 (*)    INR 1.3 (*)    All other components within normal limits  URINALYSIS, W/ REFLEX TO CULTURE (INFECTION SUSPECTED) - Abnormal; Notable for the following components:   APPearance TURBID (*)    Hgb urine dipstick SMALL (*)    Protein, ur 100 (*)    Leukocytes,Ua LARGE (*)    Bacteria, UA MANY (*)    All other components within normal limits  RESP PANEL BY RT-PCR (RSV, FLU A&B, COVID)  RVPGX2  CULTURE, BLOOD (ROUTINE X 2)  CULTURE, BLOOD (ROUTINE X 2)  URINE CULTURE  CALCIUM   CALCIUM   MAGNESIUM   PARATHYROID  HORMONE, INTACT (NO CA)  I-STAT CG4 LACTIC ACID, ED    EKG: EKG Interpretation Date/Time:  Monday March 23 2024 07:47:24 EDT Ventricular Rate:  85 PR Interval:  201 QRS Duration:  117 QT Interval:  364 QTC Calculation: 433 R Axis:   268  Text Interpretation: Sinus rhythm Incomplete RBBB and LAFB Confirmed by Freddi Hamilton [phone]) on 03/23/2024 7:48:51 AM  Radiology: CT ABDOMEN PELVIS WO CONTRAST Result Date: 03/23/2024 CLINICAL DATA:  Fever and sacral pain EXAM: CT ABDOMEN AND PELVIS WITHOUT CONTRAST TECHNIQUE: Multidetector CT imaging of the abdomen and pelvis was performed following the standard protocol without IV contrast. RADIATION DOSE REDUCTION: This exam was performed according to the departmental dose-optimization program which includes automated exposure control, adjustment of the mA and/or kV according to patient size and/or use of iterative reconstruction  technique. COMPARISON:  CT abdomen and pelvis dated 09/26/2018 FINDINGS: Decreased sensitivity and specificity for detailed findings due to motion artifact. Lower chest: Mild right lower lobe bronchiectasis. Subsegmental mucous plugging in the left lower lobe where there is compressive atelectasis. Questionable trace tree-in-bud nodules in the right lower lobe. Unchanged right lower lobe 5 mm pulmonary nodule (4:15). No specific follow-up imaging recommended. No pleural effusion or pneumothorax demonstrated. Partially imaged heart size is normal. Coronary artery calcifications. Hepatobiliary: No focal hepatic lesions. No intra or extrahepatic biliary ductal dilation. Trace  pneumobilia, most notable in the left hepatic lobe, likely sequela prior sphincterotomy. Cholecystectomy. 7 mm rounded focus of hyperattenuation at the level of the ampulla (2:33). Pancreas: No focal lesions or main ductal dilation. Spleen: Normal in size without focal abnormality. Adrenals/Urinary Tract: No adrenal nodules. No suspicious renal mass on this noncontrast enhanced examination , calculi or hydronephrosis. Subtle layering hyperdensity within the right posterior urinary bladder. Stomach/Bowel: Moderate hiatal hernia. Percutaneous gastrostomy tube in-situ. The balloon is slightly retracted from the anterior gastric wall. No evidence of bowel wall thickening, distention, or inflammatory changes. Extensive colonic diverticulosis without acute diverticulitis. Large volume stool in the rectum. Appendix is not discretely seen. Vascular/Lymphatic: Aortic atherosclerosis. No enlarged abdominal or pelvic lymph nodes. Reproductive: No adnexal masses. Other: No free fluid, fluid collection, or free air. Musculoskeletal: Multilevel degenerative changes of the partially imaged thoracic and lumbar spine. Diffuse subcutaneous soft tissue edema overlying the posterior lumbosacral region. Sacral wound overlying the distal sacrococcygeal spine with  findings suspicious for erosive changes of the underlying bony cortex (2:70). IMPRESSION: 1. Sacral wound overlying the distal sacrococcygeal spine with findings suspicious for underlying osteomyelitis. 2. Subtle layering hyperdensity within the right posterior urinary bladder, which may represent blood products or debris. Recommend correlation with urinalysis. 3. Percutaneous gastrostomy tube in-situ. The balloon is slightly retracted from the anterior gastric wall. 4. Questionable trace tree-in-bud nodules in the right lower lobe, which may be infectious or inflammatory. 5. A 7 mm rounded hyperattenuating focus at the level of the ampulla, which appears new compared to 2020 may represent ingested material versus stone. The patient is unlikely to tolerate breath holding for MRCP. Recommend further evaluation with right upper quadrant ultrasound examination with dedicated evaluation of the ampulla. 6. Large volume stool in the rectum, which may reflect fecal impaction. 7. Aortic Atherosclerosis (ICD10-I70.0). Coronary artery calcifications. Assessment for potential risk factor modification, dietary therapy or pharmacologic therapy may be warranted, if clinically indicated. Electronically Signed   By: Limin  Xu M.D.   On: 03/23/2024 10:18   DG Chest 2 View Result Date: 03/23/2024 CLINICAL DATA:  Suspected sepsis. EXAM: CHEST - 2 VIEW COMPARISON:  08/05/2023. FINDINGS: Low lung volume. Mild atelectatic changes at the lung bases. Bilateral lung fields are otherwise clear. No acute consolidation or lung collapse. Bilateral costophrenic angles are clear. Stable cardio-mediastinal silhouette. Small to medium retrocardiac hiatal hernia noted. No acute osseous abnormalities. The soft tissues are within normal limits. IMPRESSION: No active cardiopulmonary disease. Electronically Signed   By: Ree Molt M.D.   On: 03/23/2024 08:39     .Critical Care  Performed by: Freddi Hamilton, MD Authorized by: Freddi Hamilton, MD   Critical care provider statement:    Critical care time (minutes):  45   Critical care time was exclusive of:  Separately billable procedures and treating other patients   Critical care was necessary to treat or prevent imminent or life-threatening deterioration of the following conditions:  Sepsis and shock   Critical care was time spent personally by me on the following activities:  Development of treatment plan with patient or surrogate, discussions with consultants, evaluation of patient's response to treatment, examination of patient, ordering and review of laboratory studies, ordering and review of radiographic studies, ordering and performing treatments and interventions, pulse oximetry, re-evaluation of patient's condition and review of old charts    Medications Ordered in the ED  Chlorhexidine  Gluconate Cloth 2 % PADS 6 each (has no administration in time range)  albumin  human 25 % solution 12.5  g (12.5 g Intravenous New Bag/Given 03/23/24 1401)  lactated ringers  infusion (0 mL/hr Intravenous Stopped 03/23/24 1505)  acetaminophen  (TYLENOL ) tablet 650 mg (has no administration in time range)    Or  acetaminophen  (TYLENOL ) suppository 650 mg (has no administration in time range)  pantoprazole  (PROTONIX ) injection 40 mg (40 mg Intravenous Given 03/23/24 1455)  calcitonin (MIACALCIN ) injection 312 Units (has no administration in time range)  0.9 %  sodium chloride  infusion ( Intravenous New Bag/Given 03/23/24 1455)  lactated ringers  bolus 1,000 mL (0 mLs Intravenous Stopped 03/23/24 0937)  acetaminophen  (TYLENOL ) 160 MG/5ML solution 650 mg (650 mg Per Tube Given 03/23/24 0814)  vancomycin  (VANCOCIN ) IVPB 1000 mg/200 mL premix (0 mg Intravenous Stopped 03/23/24 0908)    Followed by  vancomycin  (VANCOCIN ) IVPB 1000 mg/200 mL premix (0 mg Intravenous Stopped 03/23/24 1044)  meropenem  (MERREM ) 1 g in sodium chloride  0.9 % 100 mL IVPB (0 mg Intravenous Stopped 03/23/24 0946)  morphine   CONCENTRATE 10 mg / 0.5 ml oral solution 20 mg (20 mg Per Tube Given 03/23/24 1045)  lactated ringers  bolus 1,000 mL (0 mLs Intravenous Stopped 03/23/24 1147)    And  lactated ringers  bolus 1,000 mL (0 mLs Intravenous Stopped 03/23/24 1326)  ondansetron  (ZOFRAN ) injection 4 mg (4 mg Intravenous Given 03/23/24 1116)  sodium chloride  0.9 % bolus 500 mL (0 mLs Intravenous Stopped 03/23/24 1459)                                    Medical Decision Making Amount and/or Complexity of Data Reviewed Labs: ordered.    Details: Acute on chronic kidney injury.  UTI. Radiology: ordered and independent interpretation performed.    Details: Deep sacral decubitus wound ECG/medicine tests: ordered and independent interpretation performed.    Details: RBBB  Risk OTC drugs. Prescription drug management. Decision regarding hospitalization.   Patient presents with signs and symptoms of sepsis.  Treated with broad IV antibiotics including meropenem  given her history of ESBL.  She was initially mildly hypotensive and given fluids.  Lactate is normal.  I consulted the hospitalist but as I was consulting them her pressure dropped into the 60s and recovered but was still in the 80s and so she was given further fluids to give 30 cc/KG.  I also consulted the ICU but her blood pressure responded quite well to the fluids and her blood pressure had come up to 140 so I reconsulted the hospitalist and Dr. Tobie will admit.     Final diagnoses:  Septic shock [facility])    ED Discharge Orders     None          Freddi Hamilton, MD 03/23/24 [phone]

## 2024-03-23 NOTE — ED Notes (Signed)
 Patient transported to X-ray

## 2024-03-23 NOTE — ED Triage Notes (Signed)
 From home, receiving hospice care. Fever and body pain

## 2024-03-23 NOTE — ED Notes (Signed)
 Pt is vomiting, suction had to be used to help pt clear vomit from mouth.

## 2024-03-24 ENCOUNTER — Other Ambulatory Visit: Payer: Self-pay

## 2024-03-24 DIAGNOSIS — A419 Sepsis, unspecified organism: Principal | ICD-10-CM

## 2024-03-24 DIAGNOSIS — R6520 Severe sepsis without septic shock: Secondary | ICD-10-CM

## 2024-03-24 LAB — CBC
HCT: 25.4 % — ABNORMAL LOW (ref 36.0–46.0)
Hemoglobin: 8 g/dL — ABNORMAL LOW (ref 12.0–15.0)
MCH: 25.8 pg — ABNORMAL LOW (ref 26.0–34.0)
MCHC: 31.5 g/dL (ref 30.0–36.0)
MCV: 81.9 fL (ref 80.0–100.0)
Platelets: 485 K/uL — ABNORMAL HIGH (ref 150–400)
RBC: 3.1 MIL/uL — ABNORMAL LOW (ref 3.87–5.11)
RDW: 16.6 % — ABNORMAL HIGH (ref 11.5–15.5)
WBC: 19.4 K/uL — ABNORMAL HIGH (ref 4.0–10.5)
nRBC: 0 % (ref 0.0–0.2)

## 2024-03-24 LAB — CALCIUM: Calcium: 10.6 mg/dL — ABNORMAL HIGH (ref 8.9–10.3)

## 2024-03-24 LAB — PROTIME-INR
INR: 1.3 — ABNORMAL HIGH (ref 0.8–1.2)
Prothrombin Time: 16.4 s — ABNORMAL HIGH (ref 11.4–15.2)

## 2024-03-24 LAB — COMPREHENSIVE METABOLIC PANEL WITH GFR
ALT: 12 U/L (ref 0–44)
AST: 22 U/L (ref 15–41)
Albumin: 1.8 g/dL — ABNORMAL LOW (ref 3.5–5.0)
Alkaline Phosphatase: 71 U/L (ref 38–126)
Anion gap: 6 (ref 5–15)
BUN: 49 mg/dL — ABNORMAL HIGH (ref 8–23)
CO2: 22 mmol/L (ref 22–32)
Calcium: 10.6 mg/dL — ABNORMAL HIGH (ref 8.9–10.3)
Chloride: 107 mmol/L (ref 98–111)
Creatinine, Ser: 2.2 mg/dL — ABNORMAL HIGH (ref 0.44–1.00)
GFR, Estimated: 21 mL/min — ABNORMAL LOW (ref >60)
Glucose, Bld: 131 mg/dL — ABNORMAL HIGH (ref 70–99)
Potassium: 4.7 mmol/L (ref 3.5–5.1)
Sodium: 135 mmol/L (ref 135–145)
Total Bilirubin: 0.6 mg/dL (ref 0.0–1.2)
Total Protein: 5.6 g/dL — ABNORMAL LOW (ref 6.5–8.1)

## 2024-03-24 LAB — CBG MONITORING, ED
Glucose-Capillary: 117 mg/dL — ABNORMAL HIGH (ref 70–99)
Glucose-Capillary: 119 mg/dL — ABNORMAL HIGH (ref 70–99)

## 2024-03-24 LAB — BLOOD GAS, VENOUS
Acid-base deficit: 2 mmol/L (ref 0.0–2.0)
Bicarbonate: 23.1 mmol/L (ref 20.0–28.0)
Drawn by: 1448
O2 Saturation: 83.6 %
Patient temperature: 37
pCO2, Ven: 40 mmHg — ABNORMAL LOW (ref 44–60)
pH, Ven: 7.37 (ref 7.25–7.43)
pO2, Ven: 45 mmHg (ref 32–45)

## 2024-03-24 LAB — PHOSPHORUS: Phosphorus: 3.2 mg/dL (ref 2.5–4.6)

## 2024-03-24 LAB — CORTISOL-AM, BLOOD: Cortisol - AM: 17.4 ug/dL (ref 6.7–22.6)

## 2024-03-24 LAB — PARATHYROID HORMONE, INTACT (NO CA): PTH: 29 pg/mL (ref 15–65)

## 2024-03-24 MED ORDER — RIVAROXABAN 15 MG PO TABS
15.0000 mg | ORAL_TABLET | Freq: Every day | ORAL | Status: DC
  Administered 2024-03-24 – 2024-04-02 (×10): 15 mg
  Filled 2024-03-24 (×11): qty 1

## 2024-03-24 MED ORDER — POLYETHYLENE GLYCOL 3350 17 G PO PACK
17.0000 g | PACK | Freq: Every day | ORAL | Status: DC
  Administered 2024-03-24: 17 g via ORAL
  Filled 2024-03-24 (×4): qty 1

## 2024-03-24 MED ORDER — GERHARDT'S BUTT CREAM
1.0000 | TOPICAL_CREAM | Freq: Two times a day (BID) | CUTANEOUS | Status: DC
  Administered 2024-03-25: 1 via TOPICAL
  Filled 2024-03-24: qty 60

## 2024-03-24 MED ORDER — FREE WATER
200.0000 mL | Status: DC
  Administered 2024-03-24 – 2024-04-03 (×55): 200 mL

## 2024-03-24 MED ORDER — DOCUSATE SODIUM 50 MG/5ML PO LIQD
100.0000 mg | Freq: Every day | ORAL | Status: DC
  Administered 2024-03-24: 100 mg
  Filled 2024-03-24 (×5): qty 10

## 2024-03-24 MED ORDER — BISACODYL 10 MG RE SUPP
10.0000 mg | Freq: Once | RECTAL | Status: DC
  Filled 2024-03-24: qty 1

## 2024-03-24 MED ORDER — DOCUSATE SODIUM 50 MG/5ML PO LIQD: 100.0000 mg | Freq: Every day | ORAL | Status: DC

## 2024-03-24 MED ORDER — LACTATED RINGERS IV BOLUS
1000.0000 mL | Freq: Once | INTRAVENOUS | Status: AC
  Administered 2024-03-24: 1000 mL via INTRAVENOUS

## 2024-03-24 NOTE — Progress Notes (Signed)
 [facility] Progress Note Patient: [name] FMW:[id number] DOB: [birth year] DOA: 03/23/2024  DOS: the patient was seen and examined on 03/24/2024  Brief Hospital Course: Patient who is currently on hospice with PMH of HTN, HLD, CAD, CVA with chronic debility, currently bedbound and wheelchair-bound, CKD 3A presented to the hospital with complaints of worsening lethargy and inability to follow commands. [name] is an Charity fundraiser and is primary caregiver. At present currently being treated for possible sacral osteomyelitis as well as a UTI.  Assessment and Plan: Severe sepsis secondary sacral osteomyelitis as well as UTI. Present on admission. Met SIRS criteria with fever, hypotension, tachypnea. Source of infection CT scan confirms presence of sacral osteomyelitis without any abscess. Wound care consulted.  Topical treatment recommended. Currently receiving IV antibiotic. Based on prior history patient had ESBL therefore broad-spectrum antibiotic with meropenem  with vancomycin . Hospice nurse performed bedside I&D recently per daughter. Blood cultures performed. Urine culture growing E. coli.  hypercalcemia. Treated with calcitonin. Improving.  Will monitor for now.  HTN. Blood pressure soft. Holding home medication.  Acute delirium.  Suspected baseline dementia. Chronic bedbound. Monitor for now.  CAD SP PCI. Monitor for now per  CKD 3B. Renal function stable. Monitor for now with IV hydration.  History of CVA. PAF On Xarelto .  Will continue. Currently rate controlled.  Chronic HFpEF Monitor is adequate.  No indication for diuresis.  Urinary retention. Foley catheter inserted. Will monitor progression, suspect patient will need Foley on discharge.  Hypoalbuminemia Anemia likely nutritional deficiency with This is despite patient being on tube feeds. Dietary consulted.  Constipation. Continue suppository.  Goals of care conversation. Discussed in  detail with regards to goals of care with daughter at bedside. Recommended DNR/DNI as well as continuing hospice philosophy as her presentation with sacral osteomyelitis is expected in a patient who is chronically debilitated and dealing with nutritional issues. Palliative care consulted. Complicated family situation per daughter.  No POA.  Will monitor outcomes.  Subjective: Patient nonverbal for me.  Son at bedside tells me that she did talk with him and she responded well.  She appears to be at her baseline per son mentation wise.  No acute events overnight.  Physical Exam: Patient was examined after obtaining permission from son. Clear to auscultation. S1-S2 present. Trace edema. Bowel sound present. Some diffuse tenderness seen. Alert, nonverbal, not willing to follow commands.  Uncooperative.  Data Reviewed: I have Reviewed nursing notes, Vitals, and Lab results. Since last encounter, pertinent lab results CBC and BMP   . I have ordered test including CBC and BMP  .  Palliative care consulted.  Disposition: Status is: Inpatient Remains inpatient appropriate because: Monitor improvement in infection and clarity of goal of care.  Rivaroxaban  (XARELTO ) tablet 15 mg   Family Communication: Discussed with daughter and son at bedside Level of care: Progressive   Vitals:   03/24/24 1130 03/24/24 1150 03/24/24 1235 03/24/24 1657  BP: (!) 110/56 (!) 98/58 109/64 93/62  Pulse:   89 85  Resp: 15 (!) 22 20 18   Temp:  98.2 F (36.8 C) 98.7 F (37.1 C) (!) 97.1 F (36.2 C)  TempSrc:  Oral Axillary Axillary  SpO2: 99% 99% 97% 97%  Weight:   71.6 kg   Height:   5' 5 (1.651 m)      Author: Yetta Blanch, MD 03/24/2024 7:05 PM  Please look on [email or web address] to find out who is on call.

## 2024-03-24 NOTE — ED Notes (Signed)
 Pt to floor with transport and tech.

## 2024-03-24 NOTE — ED Notes (Signed)
 Patient repositioned to R side with pillow under L hip and heels free from pressure.

## 2024-03-24 NOTE — TOC CM/SW Note (Signed)
 Transition of Care [facility]) - Inpatient Brief Assessment   Patient Details  Name: [name] MRN: [id number] Date of Birth: 11/14/1933  [facility]) CM/SW Contact:    Waddell Barnie Rama, RN Phone Number: 03/24/2024, 2:34 PM   Clinical Narrative: From home with daughter, Saundra [phone] home with [facility], she has home oxygen, has PCP and insurance on file,   family member will transport them home or mayb go by ambulance  at [employer] and family is support system,   Pta bed bound. NCM spoke with Gustav with [facility] to confirm.     Transition of Care Asessment: Insurance and Status: Insurance coverage has been reviewed Patient has primary care physician: Yes Home environment has been reviewed: from home with [facility] Prior level of function:: bed bound Prior/Current Home Services: Current home services (home oxygen , home hospice, tube feeding pump,) Social Drivers of Health Review: SDOH reviewed no interventions necessary Readmission risk has been reviewed: Yes Transition of care needs: transition of care needs identified, TOC will continue to follow

## 2024-03-24 NOTE — Progress Notes (Signed)
 [facility] night cross cover note:   I was notified by the patient's RN that the patient has been having numerous episodes of loose, nonbloody stool since the start this evening's night shift.  RN conveys that the patient is on antibiotics, but is also on tube feeds that may be contributing to her loose stool.  I have ordered C. Diff evaluation, and also added order for Gerhart's Butt paste.      Eva Pore, DO Hospitalist

## 2024-03-24 NOTE — ED Notes (Signed)
 Pt alert, confused at baseline, VS WNL, no signs of distress noted at this time.

## 2024-03-24 NOTE — ED Notes (Signed)
 Pt BP suddenly dropped 73/33 MAP 47, Howerter MD notified. New orders placed (see MAR). Upon attempting awaking pt, pt was unresponsive, HR stable, still hypotensive. ED MD at bedside, LR bolus pressured in, pt responding appropriately to fluids. Howerter MD arrived at bedside, additional orders placed.

## 2024-03-24 NOTE — Plan of Care (Signed)
   Problem: Education: Goal: Knowledge of General Education information will improve Description Including pain rating scale, medication(s)/side effects and non-pharmacologic comfort measures Outcome: Progressing

## 2024-03-24 NOTE — Progress Notes (Addendum)
 TR[facility] night cross cover note:   Regarding this patient who appears to be here with fever, on broad spectrum iv abx, I was notified by the patient's RN that the patient's blood pressure is now slightly softer, with systolic values in the high 70s, compared to systolic blood pressures in the low 100s earlier this evening.  Additional vital signs at this time notable for heart rate in the 80s respiratory rate 17, and oxygen saturation 98% .  No new symptoms reported at this time.  Not currently on IV fluids.  I subsequently ordered a 1 L LR bolus to be given over 2 hours.   Update: prior to initiation of the 1 liter LR, patient had episode of diminished responsiveness, but subseuqently repsonded to sternal rub. She is now verbal and denies current pain. At the time of the diminsihed responsiveness, RN visit patient appeared tensed-up, although patient's daughter, who is an Charity fundraiser, and is present at bedside, conveys that the tensing-up is not new for the patient.  CBG at the time was greater than 100.  Blood pressure is improving with the 1 L LR bolus.  Shortly after its initiation, systolic blood pressures down to the 90s to low 100s.  Oxygen saturation is 100% on room air.  Will also check a vbg to further assess the episode of diminished responsiveness.     Eva Pore, DO Hospitalist

## 2024-03-24 NOTE — ED Notes (Signed)
 [name] is requesting miralax  for constipation. Last BM 4 days ago per daughter. MD notified.

## 2024-03-24 NOTE — Consult Note (Signed)
 WOC Nurse Consult Note: Reason for Consult: Consult requested for sacrum wound.  Performed remotely after review of progress notes and photos in the EMR.  Wound type: Dark red moist chronic Stage 4 pressure injury with osteomyelitis; patient is currently being treated with antibiotics.  Pressure Injury POA: Yes Dressing procedure/placement/frequency: Topical treatment orders provided for bedside nurses to perform as follows to provide antimicrobial benefits and absorb drainage: Cut piece of Aquacel Soila # [phone]) and tuck into sacrum wound Q day, using swab to fill, then cover with foam dressing. Change foam dressing Q 3 days or PRN soiling. Please re-consult if further assistance is needed.  Thank-you,  Stephane Fought MSN, RN, CWOCN, CWCN-AP, CNS Contact Mon-Fri 0700-1500: [phone]

## 2024-03-24 NOTE — ED Notes (Signed)
 Patient repositioned to L side with pillow under R hip and heels free from pressure.

## 2024-03-25 DIAGNOSIS — Z515 Encounter for palliative care: Secondary | ICD-10-CM | POA: Diagnosis not present

## 2024-03-25 DIAGNOSIS — M4628 Osteomyelitis of vertebra, sacral and sacrococcygeal region: Secondary | ICD-10-CM | POA: Diagnosis not present

## 2024-03-25 DIAGNOSIS — R509 Fever, unspecified: Secondary | ICD-10-CM | POA: Diagnosis not present

## 2024-03-25 DIAGNOSIS — R6521 Severe sepsis with septic shock: Secondary | ICD-10-CM

## 2024-03-25 DIAGNOSIS — A419 Sepsis, unspecified organism: Principal | ICD-10-CM

## 2024-03-25 LAB — COMPREHENSIVE METABOLIC PANEL WITH GFR
ALT: 13 U/L (ref 0–44)
AST: 22 U/L (ref 15–41)
Albumin: 1.7 g/dL — ABNORMAL LOW (ref 3.5–5.0)
Alkaline Phosphatase: 79 U/L (ref 38–126)
Anion gap: 9 (ref 5–15)
BUN: 45 mg/dL — ABNORMAL HIGH (ref 8–23)
CO2: 21 mmol/L — ABNORMAL LOW (ref 22–32)
Calcium: 9.8 mg/dL (ref 8.9–10.3)
Chloride: 110 mmol/L (ref 98–111)
Creatinine, Ser: 1.9 mg/dL — ABNORMAL HIGH (ref 0.44–1.00)
GFR, Estimated: 25 mL/min — ABNORMAL LOW (ref >60)
Glucose, Bld: 122 mg/dL — ABNORMAL HIGH (ref 70–99)
Potassium: 4.1 mmol/L (ref 3.5–5.1)
Sodium: 140 mmol/L (ref 135–145)
Total Bilirubin: 0.4 mg/dL (ref 0.0–1.2)
Total Protein: 5.5 g/dL — ABNORMAL LOW (ref 6.5–8.1)

## 2024-03-25 LAB — CBC WITH DIFFERENTIAL/PLATELET
Abs Immature Granulocytes: 0.1 K/uL — ABNORMAL HIGH (ref 0.00–0.07)
Basophils Absolute: 0 K/uL (ref 0.0–0.1)
Basophils Relative: 0 %
Eosinophils Absolute: 0.3 K/uL (ref 0.0–0.5)
Eosinophils Relative: 2 %
HCT: 25.7 % — ABNORMAL LOW (ref 36.0–46.0)
Hemoglobin: 7.9 g/dL — ABNORMAL LOW (ref 12.0–15.0)
Immature Granulocytes: 1 %
Lymphocytes Relative: 11 %
Lymphs Abs: 1.6 K/uL (ref 0.7–4.0)
MCH: 25.7 pg — ABNORMAL LOW (ref 26.0–34.0)
MCHC: 30.7 g/dL (ref 30.0–36.0)
MCV: 83.7 fL (ref 80.0–100.0)
Monocytes Absolute: 0.9 K/uL (ref 0.1–1.0)
Monocytes Relative: 6 %
Neutro Abs: 11.5 K/uL — ABNORMAL HIGH (ref 1.7–7.7)
Neutrophils Relative %: 80 %
Platelets: 509 K/uL — ABNORMAL HIGH (ref 150–400)
RBC: 3.07 MIL/uL — ABNORMAL LOW (ref 3.87–5.11)
RDW: 17 % — ABNORMAL HIGH (ref 11.5–15.5)
WBC: 14.4 K/uL — ABNORMAL HIGH (ref 4.0–10.5)
nRBC: 0 % (ref 0.0–0.2)

## 2024-03-25 LAB — C DIFFICILE QUICK SCREEN W PCR REFLEX
C Diff antigen: POSITIVE — AB
C Diff toxin: NEGATIVE

## 2024-03-25 LAB — GLUCOSE, CAPILLARY
Glucose-Capillary: 108 mg/dL — ABNORMAL HIGH (ref 70–99)
Glucose-Capillary: 116 mg/dL — ABNORMAL HIGH (ref 70–99)

## 2024-03-25 LAB — PHOSPHORUS: Phosphorus: 2 mg/dL — ABNORMAL LOW (ref 2.5–4.6)

## 2024-03-25 LAB — VANCOMYCIN, RANDOM: Vancomycin Rm: 14 ug/mL

## 2024-03-25 LAB — MAGNESIUM: Magnesium: 2 mg/dL (ref 1.7–2.4)

## 2024-03-25 LAB — CLOSTRIDIUM DIFFICILE BY PCR, REFLEXED: Toxigenic C. Difficile by PCR: POSITIVE — AB

## 2024-03-25 LAB — C-REACTIVE PROTEIN: CRP: 11.9 mg/dL — ABNORMAL HIGH (ref <1.0)

## 2024-03-25 MED ORDER — BANATROL TF EN LIQD
60.0000 mL | Freq: Two times a day (BID) | ENTERAL | Status: DC
  Administered 2024-03-25 – 2024-04-02 (×15): 60 mL
  Filled 2024-03-25 (×17): qty 60

## 2024-03-25 MED ORDER — MORPHINE SULFATE (CONCENTRATE) 10 MG /0.5 ML PO SOLN
20.0000 mg | ORAL | Status: DC | PRN
  Administered 2024-03-27: 20 mg via ORAL
  Filled 2024-03-25 (×2): qty 1

## 2024-03-25 MED ORDER — VANCOMYCIN VARIABLE DOSE PER UNSTABLE RENAL FUNCTION (PHARMACIST DOSING): Status: DC

## 2024-03-25 MED ORDER — VANCOMYCIN HCL IN DEXTROSE 1-5 GM/200ML-% IV SOLN
1000.0000 mg | Freq: Once | INTRAVENOUS | Status: AC
  Administered 2024-03-25: 1000 mg via INTRAVENOUS
  Filled 2024-03-25: qty 200

## 2024-03-25 MED ORDER — VANCOMYCIN 50 MG/ML ORAL SOLUTION
125.0000 mg | Freq: Four times a day (QID) | ORAL | Status: DC
  Administered 2024-03-25 – 2024-04-03 (×36): 125 mg
  Filled 2024-03-25 (×40): qty 2.5

## 2024-03-25 MED ORDER — GERHARDT'S BUTT CREAM
1.0000 | TOPICAL_CREAM | Freq: Three times a day (TID) | CUTANEOUS | Status: DC
  Administered 2024-03-25 – 2024-04-02 (×23): 1 via TOPICAL
  Filled 2024-03-25 (×2): qty 60

## 2024-03-25 MED ORDER — VANCOMYCIN HCL 125 MG PO CAPS
125.0000 mg | ORAL_CAPSULE | Freq: Three times a day (TID) | ORAL | Status: DC
  Filled 2024-03-25 (×2): qty 1

## 2024-03-25 MED ORDER — PROSOURCE TF20 ENFIT COMPATIBL EN LIQD
60.0000 mL | Freq: Every day | ENTERAL | Status: DC
  Administered 2024-03-25 – 2024-04-03 (×10): 60 mL
  Filled 2024-03-25 (×10): qty 60

## 2024-03-25 MED ORDER — DAKINS (1/4 STRENGTH) 0.125 % EX SOLN
Freq: Every day | CUTANEOUS | Status: AC
  Administered 2024-03-27: 1
  Filled 2024-03-25: qty 473

## 2024-03-25 MED ORDER — SODIUM CHLORIDE 0.9 % IV SOLN
500.0000 mg | Freq: Two times a day (BID) | INTRAVENOUS | Status: DC
  Administered 2024-03-25 – 2024-03-27 (×4): 500 mg via INTRAVENOUS
  Filled 2024-03-25 (×5): qty 10

## 2024-03-25 MED ORDER — JUVEN PO PACK
1.0000 | PACK | Freq: Two times a day (BID) | ORAL | Status: DC
  Administered 2024-03-25 – 2024-04-02 (×15): 1
  Filled 2024-03-25 (×15): qty 1

## 2024-03-25 NOTE — Consult Note (Signed)
 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  Is on antibiotic coverage for presumed osteomyelitis Wound type:pressure injury Pressure Injury POA: Yes Measurement: 3 cm x 2.5 cm x 2 cm with undermining circumferentially.   Wound bed:100% slough, adherent stringy and tan.   Drainage (amount, consistency, odor) moderate serosanguinous  Musty odor Periwound: There is an area of necrotic tissue to perimeter of wound at 3 o'clock.  Black eschar noted.  Patient has had copious amounts of loose stool, rectal fecal manager in place.   Daughter at bedside who is primary caregiver for patient. She explains that the wound developed quickly at home.  We discussed the skin being an organ and as body systems decline, the skin can as well.  I explain that wounds with undermining like that are usually a result of shearing and to minimize this as much as possible with transfers and care.   Daughter has noticed a decline in patient's ability to assist with ADLs over the last month.  She has a G tube for enteral feeds. We will initiate Dakins and consult PT for conservative sharps debridement if possible.  Dressing procedure/placement/frequency: cleanse sacral wound with NS and pat dry. Gently fill wound bed, including undermining with DAKINS moist kerlix (use small size roll)  Cover with dry gauze and sacral foam.  Change daily and PRN soilage. Will not follow at this time.  Please re-consult if needed.  Darice Cooley MSN, RN, FNP-BC CWON Wound, Ostomy, Continence Nurse Outpatient [facility] [phone] Pager [phone]

## 2024-03-25 NOTE — Progress Notes (Signed)
 Pharmacy Antibiotic Note  [name] is a 89 y.o. female admitted on 03/23/2024 with suspected osteomyelitis.  History of ESBL E. Coli UTI. Pharmacy has been consulted for vancomycin  dosing.  Patient with positive C. Diff antigen and diarrhea, on oral vancomycin  for 10 days. Received vancomycin  2g load on 7/14. No doses since due to AKI. Scr has trended down from 2.55 to 1.90, leukocytosis is improving. Random vanc level returned at 14, which is therapeutic. Will dose tonight and monitor for continued improvement in kidney function. If kidney function improves tomorrow, can transition to scheduled regimen.   Estimated Ke 0.017 with 1 level kinetics. ~ 40 hr half life. Calculated peak with 1g dose this evening is ~31 mg/dL.   Plan: Vancomycin  1g IV x1 tonight Monitor for renal recovery and start scheduled regimen tomorrow Meropenem  500 mg IV q12h - renally adjusted Monitor culture data, clinical status, and renal function  Height: 5' 5 (165.1 cm) Weight: 80.3 kg (177 lb) IBW/kg (Calculated) : 57  Temp (24hrs), Avg:97.8 F (36.6 C), Min:97.1 F (36.2 C), Max:98.7 F (37.1 C)  Recent Labs  Lab 03/23/24 0748 03/23/24 0812 03/24/24 0322 03/25/24 0242  WBC 12.4*  --  19.4* 14.4*  CREATININE 2.55*  --  2.20* 1.90*  LATICACIDVEN  --  1.5  --   --     Estimated Creatinine Clearance: 21.8 mL/min (A) (by C-G formula based on SCr of 1.9 mg/dL (H)).    Allergies  Allergen Reactions   Atorvastatin Nausea And Vomiting   Contrast Media [Iodinated Contrast Media] Other (See Comments)    Nausea/Vomiting and Shortness of Breath   Darvon [Propoxyphene] Nausea And Vomiting   Codeine Nausea And Vomiting and Anxiety    Antimicrobials this admission: Vancomcyin 7/14 >>  Meropenem  7/14 >>   Microbiology results: 7/14 BCx: ngtd x2 7/14 UCx: > 100K E. Coli, 40K E. faecalis  7/16 C. Diff: antigen positive, toxin negative    Thank you for allowing pharmacy to be a part of this patient's  care.   Nidia Schaffer, PharmD PGY2 Cardiology Pharmacy Resident  Please check AMION for all [facility] Pharmacy phone numbers After 10:00 PM, call Main Pharmacy [phone] 03/25/2024 11:46 AM

## 2024-03-25 NOTE — Consult Note (Cosign Needed)
 Consultation Note Date: 03/25/2024   Patient Name: [name]  DOB: 1934/07/20  MRN: [id number]  Age / Sex: 89 y.o., female  PCP: Pcp, No Referring Physician: Trixie Nilda CHRISTELLA, MD  Reason for Consultation: Establishing goals of care  HPI/Patient Profile: 89 y.o. female  with past medical history of Casi CHRISTELLA Ruddy is a 89 y.o. female with past medical history  of  hypertension, hyperlipidemia, nausea, CKD stage III, coronary artery disease, and stroke  admitted on 03/23/2024 with fever.  Empiric diagnosis of sacral osteomyelitis and UTI. Also tested positive for c-difficile.    PMT has been consulted to assist with goals of care conversation.  Clinical Assessment and Goals of Care:  We have reviewed medical records including EPIC notes, labs and imaging, , assessed the patient and then  with daughter Emiko Osorto to discuss diagnosis prognosis, GOC, EOL wishes, disposition and options.   This morning, we visited the patient at her bedside with her daughter, Emonnie Cannady, present. The patient appears chronically ill, is minimally responsive, and mumbles.  We introduced to patient's daughter [name] Palliative Medicine as specialized medical care for people living with serious illness. It focuses on providing relief from the symptoms and stress of a serious illness. The goal is to improve quality of life for both the patient and the family.  We attempted to elicit values and goals of care important to the patient.    Medical History Review and Family/Patient Understanding:   89 y.o. female  with past medical history of Yizel CHRISTELLA Haberkorn is a 89 y.o. female with past medical history  of  hypertension, hyperlipidemia, nausea, CKD stage III, coronary artery disease, and stroke  admitted on 03/23/2024 with fever.   Empiric diagnosis of sacral osteomyelitis and UTI. Also tested positive for c-difficile.   Social History: Patient currently lives with daughter  Summit Borchardt. Patient is on community hospice service provided by [facility].   Functional and Nutritional State: Patient is bed bound at baseline. Has G-tube to support nutritional status.   Palliative Symptoms: Failure to thrive, immobility  Advance Directives: Patient currently does not have any advance directives.    Code Status: Do Not Intubate  Discussion:  We discussed a brief life review of the patient and then focused on their current illness. We shared that patient has multiple co-morbidities including hypertension, hyperlipidemia, nausea, CKD stage III, coronary artery disease, and stroke. With the current UTI, possible c-diff colitis and sacral osteomyelitis, she may potentially will not have a meaningful recovery given the disease burden.  We had an extensive conversation with [name], who shared that the patient lives with her and is currently under the care of [facility]. The patient is primarily bed-bound. [name] mentioned that the patient does not have a healthcare power of attorney [facility]) and acknowledged the need to establish goals of care soon, given the patient's poor prognosis, which is complicated by osteomyelitis of the sacral spine. [name] voiced concerns about complicated family dynamics due to the large number of siblings and disagreement but will attempt to coordinate a family meeting.  Later in the afternoon, I spoke with another daughter, Abbie Berling. She confirmed that the patient lives with [name] and expressed multiple questions about the patient's current medical status. Therisa is requesting that the team reach out to her and the other siblings to provide updates.  Additionally, I spoke with the [facility] RN Everlina [phone]), who confirmed that the patient lives with [name] and that hospice services are in place.  We discussed a brief life review of the patient and then focused on their current illness. We shared that patient has multiple  co-morbidities including hypertension, hyperlipidemia, nausea, CKD stage III, coronary artery disease, and stroke. With the current UTI, possible c-diff colitis and sacral osteomyelitis, she may potentially will not have a meaningful recovery given the disease burden.  The difference between aggressive medical intervention and comfort care was considered in light of the patient's goals of care. Hospice and Palliative Care services outpatient were explained and offered.   Discussed the importance of continued conversation with family and the medical providers regarding overall plan of care and treatment options, ensuring decisions are within the context of the patient's values and GOCs.   Questions and concerns were addressed.  Hard Choices booklet left for review. The family was encouraged to call with questions or concerns.  PMT will continue to support holistically.  Primary Decision Maker: NEXT OF KIN   SUMMARY OF RECOMMENDATIONS   Code Status: Full Code, Do NOT Intubate Provided psycho-social and emotional support to daughter. [facility] service for emotional and spiritual support (order placed) Palliative team to continue to provide holistic support Will attempt to coordinate family meeting to discuss goals of care. Attempted to reach out to other siblings telephonically, no answer, voice message left requesting return call.    Code Status/Advance Care Planning: Full code  Palliative Prophylaxis:  Aspiration, Delirium Protocol, Eye Care, Frequent Pain Assessment, Oral Care, Palliative Wound Care, and Turn Reposition  Psycho-social/Spiritual:  Desire for further Chaplaincy support:yes   Prognosis:  < 6 months  Discharge Planning: To Be Determined      Primary Diagnoses: Present on Admission:  Fever    Physical Exam Constitutional:      Appearance: She is ill-appearing.  Cardiovascular:     Rate and Rhythm: Normal rate.  Pulmonary:     Effort: Pulmonary effort  is normal.  Musculoskeletal:     Comments: Generalized weakness   Skin:    General: Skin is warm and dry.  Neurological:     Mental Status: Mental status is at baseline. She is disoriented.     Vital Signs: BP (!) 95/44 (BP Location: Left Arm)   Pulse 82   Temp 97.6 F (36.4 C) (Axillary)   Resp 19   Ht 5' 5 (1.651 m)   Wt 80.3 kg   SpO2 97%   BMI 29.45 kg/m  Pain Scale: Faces       SpO2: SpO2: 97 % O2 Device:SpO2: 97 % O2 Flow Rate: .    Palliative Assessment/Data:30%    Total time: 70 minutes I spent  minutes in the care of the patient today in the above activities and documenting the encounter.  Discussed treatment plan with daughter, RN and medical team.    Kathlyne JULIANNA Tracie Mickey, NP  Palliative Medicine Team Team phone # [phone]  Thank you for allowing the Palliative Medicine Team to assist in the care of this patient. Please utilize secure chat with additional questions, if there is no response within 30 minutes please call the above phone number.  Palliative Medicine Team providers are available by phone from 7am to 7pm daily and can be reached through the team cell phone.  Should this patient require assistance outside of these hours, please call the patient's attending physician.

## 2024-03-25 NOTE — Plan of Care (Signed)

## 2024-03-25 NOTE — Progress Notes (Addendum)
 PROGRESS NOTE  [name] FMW:[id number] DOB: Nov 23, 1933 DOA: 03/23/2024 PCP: Pcp, No   LOS: 2 days   Brief Narrative / Interim history: 89 year old female with HTN, HLD, CAD, CVA, chronic debility and essentially bedbound and wheelchair-bound, CKD 3A comes into the hospital with worsening lethargy.  [name] is an Charity fundraiser and primary caregiver.  She was admitted to the hospital with empiric diagnosis of sacral osteomyelitis and UTI.  In hospital day 1 she also has had profuse diarrhea requiring rectal tube placement.  C. difficile was positive  Subjective / 24h Interval events: Patient is alert, answers some questions.  Daughter is at bedside.  Assesement and Plan: Principal problem Severe sepsis secondary to sacral osteomyelitis, UTI -she met sepsis criteria on admission with fever, hypotension, tachypnea as well as leukocytosis.  CT scan on admission with sacral wound overlying the distal sacrococcygeal spine with suspicion for underlying osteomyelitis. - Has been placed on antibiotics, has a history of ESBL, continue  Active problems Possible C. difficile colitis -patient has had profuse diarrhea on 7/15 with 10+ liquid bowel movements requiring rectal tube placement.  C. difficile was positive but negative toxin.  Given clinical picture with numerous loose bowel movements, treat with p.o. vancomycin   Possible UTI-continue antibiotics, urine cultures pending  Acute kidney injury on chronic kidney disease stage IIIb-baseline creatinine ranging 1.4-1.7, this admission up to 2.5.  Has returned close to baseline and is now 1.9.  Continue to monitor  Suspect underlying cognitive issues -monitor mental status closely, at risk for delirium  History of CVA -continue Xarelto , she has a PEG tube and is essentially bedbound.  PAF-continue Xarelto   Urinary retention-had to have Foley placed  Hyponatremia - improved with fluids  Goals of care-palliative care consulted, per daughter  family situation is complicated.  Currently multiple children.  Main caregiver is her daughter, who is a Engineer, civil (consulting), and present in the room.  She has been unable to reach out to all children.  Given current clinical picture, pressure injury with osteomyelitis, she is unlikely to have a meaningful recovery.  Until GOC are better delineated, continue antibiotics  Scheduled Meds:  bisacodyl   10 mg Rectal Once   Chlorhexidine  Gluconate Cloth  6 each Topical Daily   docusate  100 mg Per Tube Daily   free water   200 mL Per Tube Q4H   Gerhardt's butt cream  1 Application Topical TID   polyethylene glycol  17 g Oral Daily   Rivaroxaban   15 mg Per Tube Q supper   vancomycin   125 mg Per Tube QID   Continuous Infusions:  sodium chloride  100 mL/hr at 03/25/24 0631   feeding supplement (JEVITY 1.5 CAL/FIBER) 60 mL/hr at 03/25/24 0631   meropenem  (MERREM ) IV Stopped (03/24/24 2322)   PRN Meds:.acetaminophen  **OR** acetaminophen   Current Outpatient Medications  Medication Instructions   acetaminophen  (TYLENOL ) 1,000 mg, Oral, Every 6 hours PRN   aspirin  81 mg, Oral, Daily PRN   doxycycline (VIBRAMYCIN) 100 mg, 2 times daily   ergocalciferol  (VITAMIN D2) 50,000 Units, Every 30 days   hydrALAZINE  (APRESOLINE ) 25 mg, Oral, 2 times daily   ipratropium-albuterol (DUONEB) 0.5-2.5 (3) MG/3ML SOLN 3 mLs, Inhalation, Every 4 hours PRN   LUMIGAN 0.01 % SOLN 1 drop, Both Eyes, Daily at bedtime   metoprolol  succinate (TOPROL -XL) 12.5 mg, Oral, Daily   morphine  CONCENTRATE 20 mg, Oral, Every 4 hours PRN   nitroGLYCERIN  (NITROSTAT ) 0.4 MG SL tablet PLACE 1 TABLET UNDER THE TONGUE EVERY 5 MINUTES AS NEEDED.  Rivaroxaban  (XARELTO ) 15 mg, Oral, Daily with supper   rosuvastatin  (CRESTOR ) 40 MG tablet TAKE 1 TABLET BY MOUTH EVERY DAY   Sodium Chloride  Flush (SALINE FLUSH IV) Intravenous, Every 4 hours   Soft Lens Products (SENSITIVE EYES SALINE) SOLN 1 drop, Does not apply, 2 times daily    Diet Orders (From  admission, onward)     Start     Ordered   03/23/24 1347  Diet NPO time specified Except for: Sips with Meds  Diet effective now       Question:  Except for  Answer:  Sips with Meds   03/23/24 1350            DVT prophylaxis:  Rivaroxaban  (XARELTO ) tablet 15 mg   Lab Results  Component Value Date   PLT 509 (H) 03/25/2024      Code Status: Full Code  Family Communication: daughter at bedside   Status is: Inpatient Remains inpatient appropriate because: severity of illness  Level of care: Progressive  Consultants:  Palliative care  Objective: Vitals:   03/25/24 0200 03/25/24 0300 03/25/24 0549 03/25/24 0600  BP:   (!) 120/97 124/66  Pulse:   82   Resp: 18 (!) 29 19   Temp:   97.6 F (36.4 C)   TempSrc:   Axillary   SpO2:   90%   Weight:   80.3 kg   Height:        Intake/Output Summary (Last 24 hours) at 03/25/2024 1027 Last data filed at 03/25/2024 0631 Gross per 24 hour  Intake 4742.67 ml  Output 850 ml  Net 3892.67 ml   Wt Readings from Last 3 Encounters:  03/25/24 80.3 kg  08/21/23 89.8 kg  06/21/23 90.5 kg    Examination:  Constitutional: NAD Eyes: no scleral icterus ENMT: Mucous membranes are moist.  Neck: normal, supple Respiratory: clear to auscultation bilaterally, no wheezing, no crackles. Normal respiratory effort. Cardiovascular: Regular rate and rhythm, no murmurs / rubs / gallops. No LE edema.  Abdomen: non distended, no tenderness. Bowel sounds positive.  Musculoskeletal: no clubbing / cyanosis.   Data Reviewed: I have independently reviewed following labs and imaging studies   CBC Recent Labs  Lab 03/23/24 0748 03/24/24 0322 03/25/24 0242  WBC 12.4* 19.4* 14.4*  HGB 8.5* 8.0* 7.9*  HCT 27.2* 25.4* 25.7*  PLT 493* 485* 509*  MCV 81.7 81.9 83.7  MCH 25.5* 25.8* 25.7*  MCHC 31.3 31.5 30.7  RDW 16.6* 16.6* 17.0*  LYMPHSABS 2.2  --  1.6  MONOABS 0.9  --  0.9  EOSABS 0.1  --  0.3  BASOSABS 0.0  --  0.0    Recent Labs   Lab 03/23/24 0748 03/23/24 0812 03/23/24 0843 03/23/24 1655 03/24/24 0322 03/25/24 0242  NA 132*  --   --   --  135 140  K 5.1  --   --   --  4.7 4.1  CL 101  --   --   --  107 110  CO2 23  --   --   --  22 21*  GLUCOSE 133*  --   --   --  131* 122*  BUN 57*  --   --   --  49* 45*  CREATININE 2.55*  --   --   --  2.20* 1.90*  CALCIUM  11.5*  --   --  10.8* 10.6*  10.6* 9.8  AST 33  --   --   --  22 22  ALT 16  --   --   --  12 13  ALKPHOS 89  --   --   --  71 79  BILITOT 0.7  --   --   --  0.6 0.4  ALBUMIN  1.6*  --   --   --  1.8* 1.7*  MG  --   --   --  2.1  --  2.0  CRP  --   --   --   --   --  11.9*  LATICACIDVEN  --  1.5  --   --   --   --   INR  --   --  1.3*  --  1.3*  --     ------------------------------------------------------------------------------------------------------------------ No results for input(s): CHOL, HDL, LDLCALC, TRIG, CHOLHDL, LDLDIRECT in the last 72 hours.  Lab Results  Component Value Date   HGBA1C 6.0 (H) 08/20/2022   ------------------------------------------------------------------------------------------------------------------ No results for input(s): TSH, T4TOTAL, T3FREE, THYROIDAB in the last 72 hours.  Invalid input(s): FREET3  Cardiac Enzymes No results for input(s): CKMB, TROPONINI, MYOGLOBIN in the last 168 hours.  Invalid input(s): CK ------------------------------------------------------------------------------------------------------------------    Component Value Date/Time   BNP 43.0 06/21/2023 1037    CBG: Recent Labs  Lab 03/23/24 1702 03/24/24 0009 03/24/24 0520  GLUCAP 98 119* 117*    Recent Results (from the past 240 hours)  Resp panel by RT-PCR (RSV, Flu A&B, Covid) In/Out Cath Urine     Status: None   Collection Time: 03/23/24  7:56 AM   Specimen: In/Out Cath Urine; Nasal Swab  Result Value Ref Range Status   SARS Coronavirus 2 by RT PCR NEGATIVE NEGATIVE Final    Influenza A by PCR NEGATIVE NEGATIVE Final   Influenza B by PCR NEGATIVE NEGATIVE Final    Comment: (NOTE) The Xpert Xpress SARS-CoV-2/FLU/RSV plus assay is intended as an aid in the diagnosis of influenza from Nasopharyngeal swab specimens and should not be used as a sole basis for treatment. Nasal washings and aspirates are unacceptable for Xpert Xpress SARS-CoV-2/FLU/RSV testing.  Fact Sheet for Patients: BloggerCourse.com  Fact Sheet for Healthcare Providers: SeriousBroker.it  This test is not yet approved or cleared by the [place]  FDA and has been authorized for detection and/or diagnosis of SARS-CoV-2 by FDA under an Emergency Use Authorization (EUA). This EUA will remain in effect (meaning this test can be used) for the duration of the COVID-19 declaration under Section 564(b)(1) of the Act, 21 U.S.C. section 360bbb-3(b)(1), unless the authorization is terminated or revoked.     Resp Syncytial Virus by PCR NEGATIVE NEGATIVE Final    Comment: (NOTE) Fact Sheet for Patients: BloggerCourse.com  Fact Sheet for Healthcare Providers: SeriousBroker.it  This test is not yet approved or cleared by the [place]  FDA and has been authorized for detection and/or diagnosis of SARS-CoV-2 by FDA under an Emergency Use Authorization (EUA). This EUA will remain in effect (meaning this test can be used) for the duration of the COVID-19 declaration under Section 564(b)(1) of the Act, 21 U.S.C. section 360bbb-3(b)(1), unless the authorization is terminated or revoked.  Performed at [facility], [address], [place], [place] [address]   Culture, blood (Routine x 2)     Status: None (Preliminary result)   Collection Time: 03/23/24  8:02 AM   Specimen: BLOOD RIGHT ARM  Result Value Ref Range Status   Specimen Description BLOOD RIGHT ARM  Final   Special Requests    Final  BOTTLES DRAWN AEROBIC AND ANAEROBIC Blood Culture results may not be optimal due to an inadequate volume of blood received in culture bottles   Culture   Final    NO GROWTH 2 DAYS Performed at [facility], [address], [place], [place] [address]    Report Status PENDING  Incomplete  Urine Culture     Status: Abnormal (Preliminary result)   Collection Time: 03/23/24 11:03 AM   Specimen: Urine, Random  Result Value Ref Range Status   Specimen Description URINE, RANDOM  Final   Special Requests NONE Reflexed from [id number]  Final   Culture (A)  Final    >=100,000 COLONIES/mL ESCHERICHIA COLI 40,000 COLONIES/mL ENTEROCOCCUS FAECALIS SUSCEPTIBILITIES TO FOLLOW CULTURE REINCUBATED FOR BETTER GROWTH Performed at [facility], [address] [place], [place] [address]    Report Status PENDING  Incomplete  Culture, blood (Routine x 2)     Status: None (Preliminary result)   Collection Time: 03/23/24  4:55 PM   Specimen: BLOOD LEFT ARM  Result Value Ref Range Status   Specimen Description BLOOD LEFT ARM  Final   Special Requests   Final    BOTTLES DRAWN AEROBIC ONLY Blood Culture results may not be optimal due to an inadequate volume of blood received in culture bottles   Culture   Final    NO GROWTH 2 DAYS Performed at [facility], [address], [place], [place] [address]    Report Status PENDING  Incomplete  C Difficile Quick Screen w PCR reflex     Status: Abnormal   Collection Time: 03/25/24 12:14 AM   Specimen: STOOL  Result Value Ref Range Status   C Diff antigen POSITIVE (A) NEGATIVE Final   C Diff toxin NEGATIVE NEGATIVE Final   C Diff interpretation Results are indeterminate. See PCR results.  Final    Comment: Performed at [facility], [address], [place], [place] [address]  C. Diff by PCR, Reflexed     Status: Abnormal   Collection Time: 03/25/24 12:14 AM  Result Value Ref Range Status   Toxigenic C. Difficile by PCR POSITIVE (A)  NEGATIVE Final    Comment: Positive for toxigenic C. difficile with little to no toxin production. Only treat if clinical presentation suggests symptomatic illness. Performed at [facility], [address], [place], [place] [address]      Radiology Studies: No results found.   Nilda Fendt, MD, PhD [facility]  Between 7 am - 7 pm I am available, please contact me via Amion (for emergencies) or Securechat (non urgent messages)  Between 7 pm - 7 am I am not available, please contact night coverage MD/APP via Amion

## 2024-03-25 NOTE — Evaluation (Signed)
 Occupational Therapy Evaluation Patient Details Name: [name] MRN: [id number] DOB: [birth year] Today's Date: 03/25/2024   History of Present Illness   The pt is an 89 yo female presenting 7/14 with fever. Pt was hypotensive in ED, but responded well to fluids. Work up revealed sepsis due to sacral osteomyelitis, possible C-difficile colitis, and possible UTI. PMH includes: HTN, HLD, CVA, GERD, OSA, arthritis, CAD, CDK III, and obesity. Pt has been on hospice since Dec 2024, has G-tube for feeds.     Clinical Impressions Pt resting in bed on R side, not able to answer questions or follow commands, speaking in tangents throughout session. Daughter present during session and gave history and PLOF. Daughter is an Oceanographer at [employer], works 2X/wk. Pt PLOF total care for all ADLs at bed level, prior to a month ago was getting to chair with two person transfer to recliner/wheelchair with bed pad. Pt demos poor overall function, little to no AROM likely due to low motivation and poor cognition, hands kept in fists, PROM limited to composite grip, able to passively extend at elbow to near full extension, shoulder ROM limited. Pt given B palm guards for comfort and to prevent further stiffening. Spoke with Pt's daughter extensively about realistic goals of care, educated on positioning and HEP, strengthening and ADL participation. Pt would benefit from air mattress at home due to present infected sores and history of sores, and prolonged time spent in bed, would benefit from w/c cushion and hoyer lift to maximize time spent OOB. Pt states she is no longer able to assist Pt with dependent lateral slide transfers with bed pad to recliner or w/c. At this time Pt has no acute OT needs, Pt plans to continue home with hospice, all education given, daughter verbalized understanding.      If plan is discharge home, recommend the following:   Two people to help with walking and/or transfers;A lot of help with  bathing/dressing/bathroom;Assistance with cooking/housework;Assistance with feeding;Assist for transportation;Help with stairs or ramp for entrance     Functional Status Assessment   Patient has had a recent decline in their functional status and demonstrates the ability to make significant improvements in function in a reasonable and predictable amount of time.     Equipment Recommendations   [employer];Wheelchair cushion (measurements OT) (air mattress, has hospital bed)     Recommendations for Other Services         Precautions/Restrictions   Precautions Precautions: Fall Recall of Precautions/Restrictions: Impaired Precaution/Restrictions Comments: rectal pouch, foley, G-tube Restrictions Weight Bearing Restrictions Per Provider Order: No     Mobility Bed Mobility Overal bed mobility: Needs Assistance             General bed mobility comments: total    Transfers Overall transfer level: Needs assistance                 General transfer comment: will need hoyer      Balance Overall balance assessment: Needs assistance     Sitting balance - Comments: dependent on posterior and trunk support, R lean and head positioning Postural control: Right lateral lean                                 ADL either performed or assessed with clinical judgement   ADL Overall ADL's : At baseline;Needs assistance/impaired  General ADL Comments: total, not able to follow commands or participate meaningfully     Vision         Perception         Praxis         Pertinent Vitals/Pain Pain Assessment Pain Assessment: Faces Faces Pain Scale: Hurts even more Pain Location: B hands/shoulders with movement Pain Descriptors / Indicators: Grimacing, Moaning Pain Intervention(s): Monitored during session     Extremity/Trunk Assessment Upper Extremity Assessment Upper Extremity  Assessment: RUE deficits/detail;LUE deficits/detail RUE Deficits / Details: significant deficits, keeps hands in fist, PROM composite finger extension -30 degrees at PIPs. Pt given B palm guards for comfort. Limited supination, limited PROM to B shoulders roughly to 90 or less. RUE: Unable to fully assess due to pain RUE Coordination: decreased gross motor;decreased fine motor LUE Deficits / Details: significant deficits, keeps hands in fist, PROM composite finger extension -30 degrees at PIPs. Pt given B palm guards for comfort. Limited supination, limited PROM to B shoulders roughly to 90 or less. LUE: Unable to fully assess due to pain LUE Coordination: decreased gross motor;decreased fine motor   Lower Extremity Assessment Lower Extremity Assessment: Defer to PT evaluation RLE Deficits / Details: limited ankle ROM, unable to achieve neutral ankle positioning (slightly PF), also maintains knee flexion and unable to achieve knee extension even with PROM, pt resisting attempts at hip abd, limited hip ROM for flexion as well with mild resistance. grimacing and resisting, suspect painful expecially at knee and hip RLE: Unable to fully assess due to pain LLE Deficits / Details: limited ankle ROM, unable to achieve neutral ankle positioning (slightly PF), also maintains knee flexion and unable to achieve knee extension even with PROM, pt resisting attempts at hip abd, limited hip ROM for flexion as well with mild resistance. grimacing and resisting, suspect painful expecially at knee and hip LLE: Unable to fully assess due to pain   Cervical / Trunk Assessment Cervical / Trunk Assessment: Kyphotic;Other exceptions Cervical / Trunk Exceptions: preference for R lean   Communication Communication Communication: Impaired Factors Affecting Communication: Difficulty expressing self;Reduced clarity of speech   Cognition Arousal: Stuporous Behavior During Therapy: Flat affect Cognition: Cognition  impaired             OT - Cognition Comments: per daughter Pt converses as normal, during session Pt speaking in tangents and not able to follow commands.                 Following commands: Impaired Following commands impaired: Follows one step commands inconsistently     Cueing  General Comments   Cueing Techniques: Verbal cues;Gestural cues;Tactile cues;Visual cues  daughter present and supportive, discussed HEP for ROM   Exercises     Shoulder Instructions      Home Living Family/patient expects to be discharged to:: Private residence Living Arrangements: Children Available Help at Discharge: Family;Available PRN/intermittently Type of Home: House Home Access: Stairs to enter [employer] of Steps: 1 Entrance Stairs-Rails: None Home Layout: One level     Bathroom Shower/Tub: Sponge bathes at baseline   Bathroom Toilet: Standard Bathroom Accessibility: No   Home Equipment: Rollator (4 wheels);Cane - single point;Shower seat;Grab bars - tub/shower;Rolling Environmental consultant (2 wheels);BSC/3in1;Wheelchair - manual;Hospital bed   Additional Comments: Pt works 2X/wk, Chemical engineer 3X/week for showers only      Prior Functioning/Environment Prior Level of Function : Needs assist       Physical Assist : Mobility (physical);ADLs (physical) Mobility (physical): Transfers;Bed mobility (not  ambulatoryu since sep 2024) ADLs (physical): Feeding;Grooming;Bathing;Dressing;Toileting;IADLs Mobility Comments: daughter uses bed pad to laterally scoot pt from bed to recliner 1-2 times/week, pt will intermittently assist with bed mobility, does not assist with transfer ADLs Comments: pt does not want to help or participate in ADLs per daughter. daughter or aides assist. Aides 3X/wk for showers only    OT Problem List: Decreased strength;Decreased range of motion;Decreased activity tolerance;Impaired balance (sitting and/or standing);Decreased coordination;Decreased  cognition;Decreased safety awareness;Impaired UE functional use;Pain   OT Treatment/Interventions:        OT Goals(Current goals can be found in the care plan section)   Acute Rehab OT Goals Patient Stated Goal: not able to participate in setting goals OT Goal Formulation: With family Time For Goal Achievement: 04/08/24 Potential to Achieve Goals: Poor   OT Frequency:       Co-evaluation              AM-PAC OT 6 Clicks Daily Activity     Outcome Measure Help from another person eating meals?: Total Help from another person taking care of personal grooming?: Total Help from another person toileting, which includes using toliet, bedpan, or urinal?: Total Help from another person bathing (including washing, rinsing, drying)?: Total Help from another person to put on and taking off regular upper body clothing?: Total Help from another person to put on and taking off regular lower body clothing?: Total 6 Click Score: 6   End of Session Nurse Communication: Mobility status  Activity Tolerance: Patient limited by pain;Other (comment) (limited due to poor cognition) Patient left: in bed;with call bell/phone within reach;with family/visitor present  OT Visit Diagnosis: Muscle weakness (generalized) (M62.81);Other symptoms and signs involving cognitive function;Pain;Adult, failure to thrive (R62.7)                Time: 8699-8657 OT Time Calculation (min): 42 min Charges:  OT General Charges $OT Visit: 1 Visit OT Evaluation $OT Eval Moderate Complexity: 1 Mod OT Treatments $Self Care/Home Management : 8-22 mins $Therapeutic Activity: 8-22 mins  Sion Reinders, OTR/L   [name] 03/25/2024, 1:57 PM

## 2024-03-25 NOTE — Evaluation (Signed)
 Physical Therapy Evaluation Patient Details Name: [name] MRN: [id number] DOB: [birth year] Today's Date: 03/25/2024  History of Present Illness  The pt is an 89 yo female presenting 7/14 with fever. Pt was hypotensive in ED, but responded well to fluids. Work up revealed sepsis due to sacral osteomyelitis, possible C-difficile colitis, and possible UTI. PMH includes: HTN, HLD, CVA, GERD, OSA, arthritis, CAD, CDK III, and obesity. Pt has been on hospice since Dec 2024, has G-tube for feeds.   Clinical Impression  Pt in bed upon arrival of PT, daughter present and agreeable to evaluation at this time. Prior to admission the pt was primarily bedbound, but with daughter performing lateral scoot transfers 1-2x/week with the pt and +2 assist of aide. The pt has stopped attempting to feed herself, assist with mobility, or ADLs in the last month. The daughter reports poor adherence to ROM and stretching exercises recently as she has been fatigued after work (pt's daughter is night shift Charity fundraiser) and has less family assist available. This has led to decreased ROM globally and increased pain with all attempts to stretch/move any extremity. The pt's daughter is hopeful that through ROM and skilled PT, her mother will regain motivation and be able to return to ambulation and mobility at home. However, the pt has been non-ambulatory since Sept of 2024 and presents today with eyes closed, and no attempt to follow commands for movements of extremities or responding to any questions. The pt did moan/yell out in response to gentle passive ROM of extremities, for which I asked RN for pain medications after session. Will deliver handout for PROM that the daughter can complete and follow for attempt at lifting pt OOB to chair. However, given sacral wound, will likely need air cushion for pressure relief and short times in chair for pressure management. Daughter is open to further rehab after d/c, however this may not be  compatible with current hospice care.       If plan is discharge home, recommend the following: Two people to help with walking and/or transfers;Two people to help with bathing/dressing/bathroom;Assistance with feeding;Direct supervision/assist for medications management;Direct supervision/assist for financial management;Assist for transportation;Help with stairs or ramp for entrance;Supervision due to cognitive status   Can travel by private vehicle   No    Equipment Recommendations Wheelchair cushion (measurements PT);Hoyer lift (air cushion for pressure relief for recliner or WC)  Recommendations for Other Services       Functional Status Assessment Patient has not had a recent decline in their functional status     Precautions / Restrictions Precautions Precautions: Fall Recall of Precautions/Restrictions: Impaired Precaution/Restrictions Comments: rectal pouch, foley, G-tube Restrictions Weight Bearing Restrictions Per Provider Order: No      Mobility  Bed Mobility Overal bed mobility: Needs Assistance Bed Mobility: Rolling Rolling: Total assist, +2 for physical assistance, +2 for safety/equipment         General bed mobility comments: totalA of 2 to roll and reposition, pt resistant at times and yelling out in pain. unable to maintain midline head/trunk in bed, dependent on support    Transfers Overall transfer level: Needs assistance                 General transfer comment: will be dependent on lift    Ambulation/Gait               General Gait Details: unable, non-ammbulatory since Sept 2024    Balance Overall balance assessment: Needs assistance     Sitting  balance - Comments: dependent on posterior and trunk support, R lean and head positioning Postural control: Right lateral lean                                   Pertinent Vitals/Pain Pain Assessment Pain Assessment: Faces Faces Pain Scale: Hurts whole lot Pain  Location: generalized with any movement Pain Descriptors / Indicators: Grimacing, Moaning Pain Intervention(s): Limited activity within patient's tolerance, Monitored during session, Repositioned    Home Living Family/patient expects to be discharged to:: Private residence Living Arrangements: Children Available Help at Discharge: Family;Available PRN/intermittently (daughter works 2 days/week but has an Engineer, production to come in when she is at work) Type of Home: [employer] Access: Stairs to enter Entrance Stairs-Rails: None Secretary/administrator of Steps: 1   Home Layout: One level Home Equipment: Rollator (4 wheels);Cane - single point;Shower seat;Grab bars - tub/shower;Rolling Environmental consultant (2 wheels);BSC/3in1;Wheelchair - manual;Hospital bed      Prior Function Prior Level of Function : Needs assist       Physical Assist : Mobility (physical);ADLs (physical) Mobility (physical): Transfers;Bed mobility (not ambulatoryu since sep 2024) ADLs (physical): Feeding;Grooming;Bathing;Dressing;Toileting;IADLs Mobility Comments: daughter uses bed pad to laterally scoot pt from bed to recliner 1-2 times/week, pt will intermittently assist with bed mobility, does not assist with transfer ADLs Comments: pt does not want to help or participate in ADLs per daughter. daughter or aides assist     Extremity/Trunk Assessment   Upper Extremity Assessment Upper Extremity Assessment: Defer to OT evaluation;Right hand dominant;RUE deficits/detail;LUE deficits/detail;Difficult to assess due to impaired cognition (no command following) RUE Deficits / Details: maintains in fist, limited ROM with poor tolerance of PROM by therapist. limited at elbow to ~90 deg but pt prefers to rest with elbow in max flexion. limited tolerance of shoulder ROM <90 deg flex/abd RUE: Unable to fully assess due to pain;Shoulder pain with ROM LUE Deficits / Details: maintains in fist, limited ROM with poor tolerance of PROM by therapist.  limited at elbow to ~90 deg but pt prefers to rest with elbow in max flexion. limited tolerance of shoulder ROM <90 deg flex/abd LUE: Unable to fully assess due to pain;Shoulder pain with ROM    Lower Extremity Assessment Lower Extremity Assessment: RLE deficits/detail;LLE deficits/detail;Difficult to assess due to impaired cognition (no command following) RLE Deficits / Details: limited ankle ROM, unable to achieve neutral ankle positioning (slightly PF), also maintains knee flexion and unable to achieve knee extension even with PROM, pt resisting attempts at hip abd, limited hip ROM for flexion as well with mild resistance. grimacing and resisting, suspect painful expecially at knee and hip RLE: Unable to fully assess due to pain LLE Deficits / Details: limited ankle ROM, unable to achieve neutral ankle positioning (slightly PF), also maintains knee flexion and unable to achieve knee extension even with PROM, pt resisting attempts at hip abd, limited hip ROM for flexion as well with mild resistance. grimacing and resisting, suspect painful expecially at knee and hip LLE: Unable to fully assess due to pain    Cervical / Trunk Assessment Cervical / Trunk Assessment: Kyphotic;Other exceptions Cervical / Trunk Exceptions: preference for R lean  Communication   Communication Communication: Impaired Factors Affecting Communication: Difficulty expressing self (could be volitional)    Cognition Arousal: Stuporous Behavior During Therapy: Flat affect   PT - Cognitive impairments: Difficult to assess Difficult to assess due to: Level of arousal  PT - Cognition Comments: pt did not follow any commands or respond to any questions. Daughter states this is normal for the patient, to not do/answer when she doesn't want to. pt moaning with all attempts to reposition and ROM of extremities. Pt's daughter reports she is worried her mom is depressed, but this has been baseline  for ~1 month Following commands: Impaired Following commands impaired:  (did not follow any commands)     Cueing Cueing Techniques: Verbal cues, Gestural cues, Tactile cues, Visual cues     General Comments General comments (skin integrity, edema, etc.): daughter present and supportive, discussed HEP for ROM    Exercises General Exercises - Upper Extremity Shoulder Flexion: PROM, Both, 5 reps, Supine Shoulder ABduction: PROM, Both, 5 reps, Supine Elbow Flexion: PROM, Both, 5 reps Elbow Extension: PROM, Both, 5 reps, Supine, Limitations Elbow Extension Limitations: 90 deg Composite Extension: PROM, Both, 10 reps (limited ROM) General Exercises - Lower Extremity Ankle Circles/Pumps: PROM, Both, 10 reps, Supine Heel Slides: PROM, Both, 10 reps Hip ABduction/ADduction: PROM, Both, 5 reps Other Exercises Other Exercises: heel cord stretch x 30 sec Other Exercises: HS stretch x30 sec   Assessment/Plan    PT Assessment Patient needs continued PT services  PT Problem List Decreased strength;Decreased range of motion;Decreased activity tolerance;Decreased balance;Decreased cognition;Pain;Decreased skin integrity       PT Treatment Interventions Functional mobility training;Therapeutic activities;Therapeutic exercise;Patient/family education    PT Goals (Current goals can be found in the Care Plan section)  Acute Rehab PT Goals Patient Stated Goal: per daughter, to reduce pain and return to being able to move PT Goal Formulation: With patient/family Time For Goal Achievement: 04/08/24 Potential to Achieve Goals: Poor    Frequency Min 1X/week        AM-PAC PT 6 Clicks Mobility  Outcome Measure Help needed turning from your back to your side while in a flat bed without using bedrails?: Total Help needed moving from lying on your back to sitting on the side of a flat bed without using bedrails?: Total Help needed moving to and from a bed to a chair (including a wheelchair)?:  Total Help needed standing up from a chair using your arms (e.g., wheelchair or bedside chair)?: Total Help needed to walk in hospital room?: Total Help needed climbing 3-5 steps with a railing? : Total 6 Click Score: 6    End of Session         PT Visit Diagnosis: Muscle weakness (generalized) (M62.81);Pain;Adult, failure to thrive (R62.7) Pain - Right/Left: Left Pain - part of body: Leg    Time: 9166-9059 PT Time Calculation (min) (ACUTE ONLY): 67 min   Charges:   PT Evaluation $PT Eval Moderate Complexity: 1 Mod PT Treatments $Therapeutic Exercise: 23-37 mins $Self Care/Home Management: 8-22 PT General Charges $$ ACUTE PT VISIT: 1 Visit         Izetta Call, PT, DPT   Acute Rehabilitation Department Office [phone] Secure Chat Communication Preferred  Izetta JULIANNA Call 03/25/2024, 12:25 PM

## 2024-03-25 NOTE — Consult Note (Signed)
 WOC team consulted for sacral wound and breakdown near anus.  Patient with diarrhea with resultant Moisture Associated Skin Damage.    MD has added Gerhardt's Butt Cream and consult for sacral wound was performed 03/24/2024 with orders in place.    Will add orders to clean buttocks/around rectum with Vashe and use Gerhardt's 3 times a day and prn soiling.  Patient should be placed on a low air loss mattress  for pressure redistribution and moisture management.   WOC team will not follow. Re-consult if further needs arise.   Thank you,    Powell Bar MSN, RN-BC, [employer]

## 2024-03-25 NOTE — Progress Notes (Addendum)
 Initial Nutrition Assessment  DOCUMENTATION CODES:   Not applicable  INTERVENTION:  -Continue Jevity 1.5 continuous @ 60 ml/hr via PEG tube. -Continue free water  flushes 200 ml q-4 at this time, monitor closely for need to increase r/t fluid loss from loose stools.  -Add Juven BID to promote wound healing -Add ProSource TF20 qday to increase protein to promote wound healing -Add Banatrol BID to assist with loose stools   NUTRITION DIAGNOSIS:   Inadequate oral intake related to inability to eat as evidenced by NPO status.  GOAL:   Patient will meet greater than or equal to 90% of their needs   MONITOR:   PO intake, Labs, TF tolerance, Skin, Weight trends  REASON FOR ASSESSMENT:   Consult Enteral/tube feeding initiation and management  ASSESSMENT:   HTN, HLD, CAD, CVA, chronic debility and essentially bedbound and wheelchair-bound, CKD 3A . ED for increased lethargy. Suspect osteomyelitis, UTI.  Attempted to speak to pt at bedside. Pt not answering questions appropriately, no family in room at this time. No noted s/sx of EN intolerance. Pt did test positive for C. Diff, large number of loose stools, FMS placed. Pt is on Jevity 1.5 continuous @ 60 ml/hr vie PEG at baseline. Attempted to perform NFPE, pt moaning/increasingly upset when examining her, could make out pt was upset that she was woken up by this RD. Will attempt NFPE at f/u. Pt also with Stage IV PU to sacrum, add Juven BID, add ProSource TF20 to promote increased protein needs. FWF @ 200 ml q-4, may need to increase r/t large amount of loose stools. Will continue to monitor, RDN available prn.   Jevity 1.5 continuous @ goal rate 60 ml/hr via PEG tube. FWF 200 ml q-4 via PEG tube.   At goal this provides 2160 kcal, 92 g protein, 1441 ml fluid from formula.  Formula + flushes = 2641 ml/day  +ProSource TF 20 adds an additional 80 kcal, 20 g protein totaling  2240 kcal, 112 g protein  Labs BG 117-131 BUN 45 Cr  1.9 Albumin  1.7 GFR 25 CRP 11.9 H/H 7.9/25.7  Medications Meropenem  Vancomycin    NUTRITION - FOCUSED PHYSICAL EXAM:  Will attempt again at f/u  Diet Order:   Diet Order             Diet NPO time specified Except for: Sips with Meds  Diet effective now                   EDUCATION NEEDS:   Not appropriate for education at this time  Skin:  Skin Assessment: Skin Integrity Issues: Skin Integrity Issues:: Stage IV Stage IV: Sacrum  Last BM:  7/16 C. Diff positive  Height:   Ht Readings from Last 1 Encounters:  03/24/24 5' 5 (1.651 m)    Weight:   Wt Readings from Last 1 Encounters:  03/25/24 80.3 kg   BMI:  Body mass index is 29.45 kg/m.  Estimated Nutritional Needs:   Kcal:  2000-2400 kcal  Protein:  85-115 g  Fluid:  >/=2L    Idelia Caudell Daml-Budig, RDN, LDN Registered Dietitian Nutritionist RD Inpatient Contact Info in [employer]

## 2024-03-26 DIAGNOSIS — R509 Fever, unspecified: Secondary | ICD-10-CM | POA: Diagnosis not present

## 2024-03-26 LAB — PHOSPHORUS: Phosphorus: 1.7 mg/dL — ABNORMAL LOW (ref 2.5–4.6)

## 2024-03-26 LAB — CBC
HCT: 21.8 % — ABNORMAL LOW (ref 36.0–46.0)
Hemoglobin: 6.9 g/dL — CL (ref 12.0–15.0)
MCH: 25.8 pg — ABNORMAL LOW (ref 26.0–34.0)
MCHC: 31.7 g/dL (ref 30.0–36.0)
MCV: 81.6 fL (ref 80.0–100.0)
Platelets: 501 K/uL — ABNORMAL HIGH (ref 150–400)
RBC: 2.67 MIL/uL — ABNORMAL LOW (ref 3.87–5.11)
RDW: 17.3 % — ABNORMAL HIGH (ref 11.5–15.5)
WBC: 11 K/uL — ABNORMAL HIGH (ref 4.0–10.5)
nRBC: 0 % (ref 0.0–0.2)

## 2024-03-26 LAB — ABO/RH: ABO/RH(D): B NEG

## 2024-03-26 LAB — COMPREHENSIVE METABOLIC PANEL WITH GFR
ALT: 13 U/L (ref 0–44)
AST: 21 U/L (ref 15–41)
Albumin: <1.5 g/dL — ABNORMAL LOW (ref 3.5–5.0)
Alkaline Phosphatase: 61 U/L (ref 38–126)
Anion gap: 6 (ref 5–15)
BUN: 50 mg/dL — ABNORMAL HIGH (ref 8–23)
CO2: 21 mmol/L — ABNORMAL LOW (ref 22–32)
Calcium: 9.1 mg/dL (ref 8.9–10.3)
Chloride: 109 mmol/L (ref 98–111)
Creatinine, Ser: 1.67 mg/dL — ABNORMAL HIGH (ref 0.44–1.00)
GFR, Estimated: 29 mL/min — ABNORMAL LOW (ref >60)
Glucose, Bld: 101 mg/dL — ABNORMAL HIGH (ref 70–99)
Potassium: 4.5 mmol/L (ref 3.5–5.1)
Sodium: 136 mmol/L (ref 135–145)
Total Bilirubin: 0.5 mg/dL (ref 0.0–1.2)
Total Protein: 4.9 g/dL — ABNORMAL LOW (ref 6.5–8.1)

## 2024-03-26 LAB — URINE CULTURE: Culture: >=100000 — AB

## 2024-03-26 LAB — GLUCOSE, CAPILLARY
Glucose-Capillary: 101 mg/dL — ABNORMAL HIGH (ref 70–99)
Glucose-Capillary: 108 mg/dL — ABNORMAL HIGH (ref 70–99)
Glucose-Capillary: 98 mg/dL (ref 70–99)
Glucose-Capillary: 98 mg/dL (ref 70–99)
Glucose-Capillary: 99 mg/dL (ref 70–99)

## 2024-03-26 LAB — HEMOGLOBIN AND HEMATOCRIT, BLOOD
HCT: 21.8 % — ABNORMAL LOW (ref 36.0–46.0)
Hemoglobin: 7 g/dL — ABNORMAL LOW (ref 12.0–15.0)

## 2024-03-26 LAB — PREPARE RBC (CROSSMATCH)

## 2024-03-26 LAB — MAGNESIUM: Magnesium: 1.9 mg/dL (ref 1.7–2.4)

## 2024-03-26 MED ORDER — K PHOS MONO-SOD PHOS DI & MONO 155-852-130 MG PO TABS
500.0000 mg | ORAL_TABLET | Freq: Two times a day (BID) | ORAL | Status: AC
  Administered 2024-03-26 (×2): 500 mg
  Filled 2024-03-26 (×2): qty 2

## 2024-03-26 MED ORDER — SODIUM CHLORIDE 0.9% IV SOLUTION: Freq: Once | INTRAVENOUS | Status: DC

## 2024-03-26 NOTE — Progress Notes (Signed)
 Daily Progress Note   Patient Name: [name]       Date: 03/26/2024 DOB: 1934-06-23  Age: 89 y.o. MRN#: [id number] Attending Physician: Trixie Nilda CHRISTELLA, MD Primary Care Physician: Pcp, No Admit Date: 03/23/2024  Reason for Consultation/Follow-up: Establishing goals of care  Subjective:  This NP Kathlyne Bolder reviewed medical records  and assessed the patient and then meet with daughter [name] at the patient's bedside  to discuss current medical condition,  prognosis, and plan of care.   I visited the patient today and observed that she appears chronically-ill but seems to be resting comfortably, showing no signs of distress. She is more alert compared to yesterday. I tried to engage in conversation with her, but she only mumbled in response. The patient's daughter, [name], is present at the bedside. [name] mentioned that the patient is scheduled to receive a blood transfusion today due to a hemoglobin level that dropped to 6.9 earlier this morning. No other concerns voiced/raised.   Created space and opportunity for daughter to explore thoughts and feelings regarding current medical situation   Values and goals of care important to patient and family were attempted to be elicited.   Natural trajectory and expectations  were discussed.  Questions and concerns addressed.  Daughter [name] encouraged to call with questions or concerns.     PMT will continue to support holistically.    Length of Stay: 3  Current Medications: Scheduled Meds:   sodium chloride    Intravenous Once   bisacodyl   10 mg Rectal Once   Chlorhexidine  Gluconate Cloth  6 each Topical Daily   docusate  100 mg Per Tube Daily   feeding supplement (PROSource TF20)  60 mL Per Tube Daily   fiber supplement (BANATROL  TF)  60 mL Per Tube BID   free water   200 mL Per Tube Q4H   Gerhardt's butt cream  1 Application Topical TID   nutrition supplement (JUVEN)  1 packet Per Tube BID   phosphorus  500 mg Per Tube BID   polyethylene glycol  17 g Oral Daily   Rivaroxaban   15 mg Per Tube Q supper   sodium hypochlorite   Irrigation Daily   vancomycin   125 mg Per Tube QID   vancomycin  variable dose per unstable renal function (pharmacist dosing)   Does not  apply See admin instructions    Continuous Infusions:  feeding supplement (JEVITY 1.5 CAL/FIBER) 1,000 mL (03/26/24 1121)   meropenem  (MERREM ) IV 500 mg (03/26/24 1058)    PRN Meds: acetaminophen  **OR** acetaminophen , morphine  CONCENTRATE  Physical Exam Constitutional:      Appearance: She is ill-appearing.  HENT:     Head: Normocephalic and atraumatic.     Mouth/Throat:     Mouth: Mucous membranes are moist.  Cardiovascular:     Rate and Rhythm: Normal rate.  Abdominal:     Comments: Gtube in place.  Musculoskeletal:     Comments: Generalized weakness   Skin:    General: Skin is warm.  Neurological:     Mental Status: Mental status is at baseline.     Comments: Confused             Vital Signs: BP 117/62 (BP Location: Left Arm)   Pulse 75   Temp 99.6 F (37.6 C) (Oral)   Resp 19   Ht 5' 5 (1.651 m)   Wt 83 kg   SpO2 100%   BMI 30.45 kg/m  SpO2: SpO2: 100 % O2 Device: O2 Device: Room Air O2 Flow Rate:    Intake/output summary:  Intake/Output Summary (Last 24 hours) at 03/26/2024 1303 Last data filed at 03/26/2024 1121 Gross per 24 hour  Intake 1893.73 ml  Output 550 ml  Net 1343.73 ml   LBM: Last BM Date : 03/25/24 Baseline Weight: Weight: 78 kg Most recent weight: Weight: 83 kg       Palliative Assessment/Data: 30%      Patient Active Problem List   Diagnosis Date Noted   AKI (acute kidney injury) (HCC) 06/20/2023   Weakness 06/20/2023   Acute metabolic encephalopathy 06/15/2023   Pressure injury of skin  06/15/2023   UTI (urinary tract infection) 06/15/2023   Fever 06/11/2023   Edema of both legs 06/10/2023   Acute on chronic heart failure with preserved ejection fraction (HCC) 06/10/2023   Cervical stenosis of spine 05/08/2023   Paroxysmal atrial fibrillation (HCC) 04/04/2023   Hypercoagulable state due to paroxysmal atrial fibrillation (HCC) 04/04/2023   Numbness and tingling of both feet 03/23/2023   Hypoalbuminemia due to protein-calorie malnutrition (HCC) 03/23/2023   Bilateral primary osteoarthritis of knee 11/01/2022   Encounter for general adult medical examination with abnormal findings 11/01/2022   Chronic kidney disease, stage 3b (HCC) 08/21/2022   Left pontine stroke (HCC) 11/06/2021   Dyslipidemia 11/06/2021   GERD without esophagitis 11/06/2021   Benign hypertensive kidney disease with chronic kidney disease 07/31/2019   Proteinuria 07/31/2019   Secondary hyperparathyroidism of renal origin (HCC) 07/31/2019   CKD stage 3b, GFR 30-44 ml/min (HCC) 07/31/2019   CKD (chronic kidney disease), stage III (HCC) 11/23/2016   Pericardial effusion 11/23/2016   Complete heart block (HCC) 11/16/2016   Non-ST elevation (NSTEMI) myocardial infarction (HCC) 11/16/2016   Renal insufficiency 11/03/2015   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 female  with past medical history of [name]  is a 89 y.o. female with past medical history  of  hypertension, hyperlipidemia, nausea, CKD stage III, coronary artery disease, and stroke   admitted on 03/23/2024 with fever.  Empiric diagnosis of sacral osteomyelitis and UTI. Also tested positive for c-difficile.   Assessment:  I visited the patient today and observed that she appears chronically-ill but seems to be resting comfortably, showing no signs of distress. She is more alert compared to yesterday. I tried to engage in conversation with her, but she only mumbled in response. The patient's daughter, [name], is present at the bedside. [name] mentioned that the patient is scheduled to receive a blood transfusion today due to a hemoglobin level that dropped to 6.9 earlier this morning. No other concerns voiced/raised.   No specific patient or family goals of care have been established at this time due to complex family dynamics. The siblings need to coordinate family meeting to discuss goals of care, considering the current disease burden or poor prognosis of patient. We informed the daughter at the bedside, Cerenity Goshorn, that the family meeting has been rescheduled for next week. Additionally, a voice message was left for Samentha Perham regarding the rescheduling of the meeting from tomorrow to next week.  Recommendations/Plan: Code Status: Full Code, Do Not Intubate Provided psycho-social and emotional support to daughter.  Symptom management per attending including PRN Tylenol  and Morphine  for pain.  In the process of attempting to coordinate family meeting (hopeful to get get all 7 children together) early next week to discuss goals of care.  Palliative team to continue to provide/offer holistic support.   Goals of Care and Additional Recommendations: Limitations on Scope of Treatment: Do not Intubate  Code Status:    Code Status Orders  (From admission, onward)           Start     Ordered   03/23/24 1346  Full code  (Code Status)  Continuous       Question:  By:  Answer:  Other   03/23/24 1350           Code Status History     Date Active Date Inactive Code  Status Order ID Comments User Context   03/23/2024 1350 03/23/2024 1350 Full Code [id number]  Tobie Mario GAILS, MD ED   06/15/2023 1123 06/20/2023 1926 Full Code [id number]  Pearlean Manus, MD ED   06/10/2023 1120 06/12/2023 2154 Full Code [id number]  Addie Perkins, DO ED   03/23/2023 2235 03/25/2023 2115 Full Code [id number]  Manfred Driver, DO ED   08/20/2022 2258 08/21/2022 2146 Full Code [id number]  Mansy, Madison LABOR, MD ED   11/06/2021 0403 11/07/2021 1759 Full Code [id number]  Mansy, Madison LABOR, MD ED   11/06/2021 0322 11/06/2021 0403 DNR [id number]  Mansy, Madison LABOR, MD ED   11/06/2021 0134 11/06/2021 0322 Full Code [id number]  Mansy, Madison LABOR, MD ED   11/16/2016 1344 11/20/2016 1733 Full Code [id number]  Wonda Sharper, MD Inpatient   11/16/2016 1344 11/16/2016 1344 Full Code [id number]  Wonda Sharper, MD Inpatient   08/28/2015 2317 09/02/2015 1640 Full Code [id number]  Oleh Alm POUR, MD ED   07/12/2014 1257 07/13/2014 1912 Full Code [id number]  Wonda Sharper, MD Inpatient   07/11/2014 0441 07/12/2014 1257 Full Code [id number]  Levern Beckey HERO, MD Inpatient       Prognosis:  Very poor given advanced age, multiple co morbidities and recent sepsis r/t UTI and sacral osteomyelitis.   Discharge Planning: Home with Hospice  Care plan was discussed  with daughters [name] and Kamilia Carollo.   Thank you for allowing the Palliative Medicine Team to assist in the care of this patient.  Time Spent: 35 minutes   Kathlyne JULIANNA Tracie Mickey, NP  Please contact Palliative Medicine Team phone at [phone] for questions and concerns.

## 2024-03-26 NOTE — Progress Notes (Signed)
 This chaplain responded to PMT NP-Erwin consult for supporting Pt.daughter and family. The Pt. daughter-Sandra and close friend are visiting at the bedside. The Pt. is awake at the end of the visit, but does not verbally respond to the chaplain's bedside presence.  The chaplain listened reflectively to [name] describe the Pt. desire to return home to [place].  The chaplain learned the Pt. children have varying opinions on the Pt. healthcare. [name] is hopeful a family meeting will provide education and an understanding of the Pt. health. Sandra's faith is a source of support as she trusts God to intervene in the upcoming medical decisions.  This chaplain is available for F/U spiritual care as needed.  Chaplain Leeroy Hummer [phone]

## 2024-03-26 NOTE — Plan of Care (Signed)
 Hemoglobin dropped to 6.9.  Patient does not have any evidence of bleeding. -Transfusing 1 unit of blood tonight. - Potassium patient has history of PAF on Xarelto .  Given does not have any evidence of bleeding/active source of bleeding continue Xarelto .

## 2024-03-26 NOTE — Progress Notes (Addendum)
 PROGRESS NOTE  [name] FMW:[id number] DOB: 1933/12/30 DOA: 03/23/2024 PCP: Pcp, No   LOS: 3 days   Brief Narrative / Interim history: 89 year old female with HTN, HLD, CAD, CVA, chronic debility and essentially bedbound and wheelchair-bound, CKD 3A comes into the hospital with worsening lethargy.  [name] is an Charity fundraiser and primary caregiver.  She was admitted to the hospital with empiric diagnosis of sacral osteomyelitis and UTI.  In hospital day 1 she also has had profuse diarrhea requiring rectal tube placement.  C. difficile was positive  Subjective / 24h Interval events: Patient is awake this morning, alert.  Daughter is at bedside.  No significant overnight events other than temperature of 100.2.  No abdominal pain, no shortness of breath, no chest discomfort.  Daughter reports that diarrhea is improving, getting more formed and slowing down  Assesement and Plan: Principal problem Severe sepsis secondary to sacral osteomyelitis, UTI -she met sepsis criteria on admission with fever, hypotension, tachypnea as well as leukocytosis.  CT scan on admission with sacral wound overlying the distal sacrococcygeal spine with suspicion for underlying osteomyelitis. - Has been placed on antibiotics, has a history of ESBL, continue, she is growing ESBL E. coli in her urine  Active problems Possible C. difficile colitis -patient has had profuse diarrhea on 7/15 with 10+ liquid bowel movements requiring rectal tube placement.  C. difficile was positive but negative toxin.  Given clinical picture with numerous loose bowel movements, treat with p.o. vancomycin  - Diarrhea is slowing down  Possible UTI-continue antibiotics, urine cultures with ESBL E. coli as well as Enterococcus  Acute kidney injury on chronic kidney disease stage IIIb-baseline creatinine ranging 1.4-1.7, this admission up to 2.5.  Creatinine is back to baseline at 1.6 today  Suspect underlying cognitive issues -monitor  mental status closely, at risk for delirium  Anemia-in the setting of acute illness, infection, chronic kidney disease.  There is also dilutional component as she received fluids.  Hemoglobin dipped to 6.9 today, there is no bleeding, transfuse a unit of packed red blood cells  History of CVA -continue Xarelto , she has a PEG tube and is essentially bedbound.  PAF-continue Xarelto   Urinary retention-had to have Foley placed  Hyponatremia - improved with fluids  Goals of care-palliative care consulted, per daughter family situation is complicated.  Currently multiple children.  Main caregiver is her daughter, who is a Engineer, civil (consulting), and present in the room.  She has been unable to reach out to all children.  Given current clinical picture, pressure injury with osteomyelitis, she is unlikely to have a meaningful recovery.  Until GOC are better delineated, continue antibiotics  Scheduled Meds:  sodium chloride    Intravenous Once   bisacodyl   10 mg Rectal Once   Chlorhexidine  Gluconate Cloth  6 each Topical Daily   docusate  100 mg Per Tube Daily   feeding supplement (PROSource TF20)  60 mL Per Tube Daily   fiber supplement (BANATROL TF)  60 mL Per Tube BID   free water   200 mL Per Tube Q4H   Gerhardt's butt cream  1 Application Topical TID   nutrition supplement (JUVEN)  1 packet Per Tube BID   phosphorus  500 mg Per Tube BID   polyethylene glycol  17 g Oral Daily   Rivaroxaban   15 mg Per Tube Q supper   sodium hypochlorite   Irrigation Daily   vancomycin   125 mg Per Tube QID   vancomycin  variable dose per unstable renal function (pharmacist dosing)  Does not apply See admin instructions   Continuous Infusions:  feeding supplement (JEVITY 1.5 CAL/FIBER) 1,000 mL (03/25/24 1204)   meropenem  (MERREM ) IV 500 mg (03/26/24 1058)   PRN Meds:.acetaminophen  **OR** acetaminophen , morphine  CONCENTRATE  Current Outpatient Medications  Medication Instructions   acetaminophen  (TYLENOL ) 1,000 mg, Oral,  Every 6 hours PRN   aspirin  81 mg, Oral, Daily PRN   doxycycline (VIBRAMYCIN) 100 mg, 2 times daily   ergocalciferol  (VITAMIN D2) 50,000 Units, Every 30 days   hydrALAZINE  (APRESOLINE ) 25 mg, Oral, 2 times daily   ipratropium-albuterol (DUONEB) 0.5-2.5 (3) MG/3ML SOLN 3 mLs, Inhalation, Every 4 hours PRN   LUMIGAN 0.01 % SOLN 1 drop, Both Eyes, Daily at bedtime   metoprolol  succinate (TOPROL -XL) 12.5 mg, Oral, Daily   morphine  CONCENTRATE 20 mg, Oral, Every 4 hours PRN   nitroGLYCERIN  (NITROSTAT ) 0.4 MG SL tablet PLACE 1 TABLET UNDER THE TONGUE EVERY 5 MINUTES AS NEEDED.   Rivaroxaban  (XARELTO ) 15 mg, Oral, Daily with supper   rosuvastatin  (CRESTOR ) 40 MG tablet TAKE 1 TABLET BY MOUTH EVERY DAY   Sodium Chloride  Flush (SALINE FLUSH IV) Intravenous, Every 4 hours   Soft Lens Products (SENSITIVE EYES SALINE) SOLN 1 drop, Does not apply, 2 times daily    Diet Orders (From admission, onward)     Start     Ordered   03/23/24 1347  Diet NPO time specified Except for: Sips with Meds  Diet effective now       Question:  Except for  Answer:  Sips with Meds   03/23/24 1350            DVT prophylaxis:  Rivaroxaban  (XARELTO ) tablet 15 mg   Lab Results  Component Value Date   PLT 501 (H) 03/26/2024      Code Status: Full Code  Family Communication: daughter at bedside   Status is: Inpatient Remains inpatient appropriate because: severity of illness  Level of care: Progressive  Consultants:  Palliative care  Objective: Vitals:   03/25/24 2100 03/26/24 0017 03/26/24 0431 03/26/24 0920  BP: 105/61 (!) 111/43 (!) 117/50 117/62  Pulse: 87 84 80 75  Resp: 20 16 19 19   Temp: 99.3 F (37.4 C) 100.2 F (37.9 C) 99.8 F (37.7 C) 99.6 F (37.6 C)  TempSrc: Axillary Axillary Axillary Oral  SpO2: 90% 97% 100% 100%  Weight:   83 kg   Height:        Intake/Output Summary (Last 24 hours) at 03/26/2024 1108 Last data filed at 03/26/2024 0800 Gross per 24 hour  Intake 1693.73 ml   Output 550 ml  Net 1143.73 ml   Wt Readings from Last 3 Encounters:  03/26/24 83 kg  08/21/23 89.8 kg  06/21/23 90.5 kg    Examination:  Constitutional: NAD Eyes: lids and conjunctivae normal, no scleral icterus ENMT: mmm Neck: normal, supple Respiratory: clear to auscultation bilaterally, no wheezing, no crackles. Normal respiratory effort.  Cardiovascular: Regular rate and rhythm, no murmurs / rubs / gallops. No LE edema. Abdomen: soft, no distention, no tenderness. Bowel sounds positive.     Data Reviewed: I have independently reviewed following labs and imaging studies   CBC Recent Labs  Lab 03/23/24 0748 03/24/24 0322 03/25/24 0242 03/26/24 0250  WBC 12.4* 19.4* 14.4* 11.0*  HGB 8.5* 8.0* 7.9* 6.9*  HCT 27.2* 25.4* 25.7* 21.8*  PLT 493* 485* 509* 501*  MCV 81.7 81.9 83.7 81.6  MCH 25.5* 25.8* 25.7* 25.8*  MCHC 31.3 31.5 30.7 31.7  RDW 16.6*  16.6* 17.0* 17.3*  LYMPHSABS 2.2  --  1.6  --   MONOABS 0.9  --  0.9  --   EOSABS 0.1  --  0.3  --   BASOSABS 0.0  --  0.0  --     Recent Labs  Lab 03/23/24 0748 03/23/24 0812 03/23/24 0843 03/23/24 1655 03/24/24 0322 03/25/24 0242 03/26/24 0250  NA 132*  --   --   --  135 140 136  K 5.1  --   --   --  4.7 4.1 4.5  CL 101  --   --   --  107 110 109  CO2 23  --   --   --  22 21* 21*  GLUCOSE 133*  --   --   --  131* 122* 101*  BUN 57*  --   --   --  49* 45* 50*  CREATININE 2.55*  --   --   --  2.20* 1.90* 1.67*  CALCIUM  11.5*  --   --  10.8* 10.6*  10.6* 9.8 9.1  AST 33  --   --   --  22 22 21   ALT 16  --   --   --  12 13 13   ALKPHOS 89  --   --   --  71 79 61  BILITOT 0.7  --   --   --  0.6 0.4 0.5  ALBUMIN  1.6*  --   --   --  1.8* 1.7* <1.5*  MG  --   --   --  2.1  --  2.0 1.9  CRP  --   --   --   --   --  11.9*  --   LATICACIDVEN  --  1.5  --   --   --   --   --   INR  --   --  1.3*  --  1.3*  --   --      ------------------------------------------------------------------------------------------------------------------ No results for input(s): CHOL, HDL, LDLCALC, TRIG, CHOLHDL, LDLDIRECT in the last 72 hours.  Lab Results  Component Value Date   HGBA1C 6.0 (H) 08/20/2022   ------------------------------------------------------------------------------------------------------------------ No results for input(s): TSH, T4TOTAL, T3FREE, THYROIDAB in the last 72 hours.  Invalid input(s): FREET3  Cardiac Enzymes No results for input(s): CKMB, TROPONINI, MYOGLOBIN in the last 168 hours.  Invalid input(s): CK ------------------------------------------------------------------------------------------------------------------    Component Value Date/Time   BNP 43.0 06/21/2023 1037    CBG: Recent Labs  Lab 03/24/24 0520 03/25/24 1727 03/25/24 2100 03/26/24 0016 03/26/24 0430  GLUCAP 117* 108* 116* 98 98    Recent Results (from the past 240 hours)  Resp panel by RT-PCR (RSV, Flu A&B, Covid) In/Out Cath Urine     Status: None   Collection Time: 03/23/24  7:56 AM   Specimen: In/Out Cath Urine; Nasal Swab  Result Value Ref Range Status   SARS Coronavirus 2 by RT PCR NEGATIVE NEGATIVE Final   Influenza A by PCR NEGATIVE NEGATIVE Final   Influenza B by PCR NEGATIVE NEGATIVE Final    Comment: (NOTE) The Xpert Xpress SARS-CoV-2/FLU/RSV plus assay is intended as an aid in the diagnosis of influenza from Nasopharyngeal swab specimens and should not be used as a sole basis for treatment. Nasal washings and aspirates are unacceptable for Xpert Xpress SARS-CoV-2/FLU/RSV testing.  Fact Sheet for Patients: BloggerCourse.com  Fact Sheet for Healthcare Providers: SeriousBroker.it  This test is not yet approved or cleared by the [place]  FDA  and has been authorized for detection and/or diagnosis of  SARS-CoV-2 by FDA under an Emergency Use Authorization (EUA). This EUA will remain in effect (meaning this test can be used) for the duration of the COVID-19 declaration under Section 564(b)(1) of the Act, 21 U.S.C. section 360bbb-3(b)(1), unless the authorization is terminated or revoked.     Resp Syncytial Virus by PCR NEGATIVE NEGATIVE Final    Comment: (NOTE) Fact Sheet for Patients: BloggerCourse.com  Fact Sheet for Healthcare Providers: SeriousBroker.it  This test is not yet approved or cleared by the [place]  FDA and has been authorized for detection and/or diagnosis of SARS-CoV-2 by FDA under an Emergency Use Authorization (EUA). This EUA will remain in effect (meaning this test can be used) for the duration of the COVID-19 declaration under Section 564(b)(1) of the Act, 21 U.S.C. section 360bbb-3(b)(1), unless the authorization is terminated or revoked.  Performed at [facility], [address] [place], [place] [address]   Culture, blood (Routine x 2)     Status: None (Preliminary result)   Collection Time: 03/23/24  8:02 AM   Specimen: BLOOD RIGHT ARM  Result Value Ref Range Status   Specimen Description BLOOD RIGHT ARM  Final   Special Requests   Final    BOTTLES DRAWN AEROBIC AND ANAEROBIC Blood Culture results may not be optimal due to an inadequate volume of blood received in culture bottles   Culture   Final    NO GROWTH 3 DAYS Performed at [facility], [address], [place], [place] [address]    Report Status PENDING  Incomplete  Urine Culture     Status: Abnormal   Collection Time: 03/23/24 11:03 AM   Specimen: Urine, Random  Result Value Ref Range Status   Specimen Description URINE, RANDOM  Final   Special Requests   Final    NONE Reflexed from [id number] Performed at [facility], [address] [place], [place] [address]    Culture (A)  Final    >=100,000 COLONIES/mL  ESCHERICHIA COLI Confirmed Extended Spectrum Beta-Lactamase Producer (ESBL).  In bloodstream infections from ESBL organisms, carbapenems are preferred over piperacillin/tazobactam. They are shown to have a lower risk of mortality. 40,000 COLONIES/mL ENTEROCOCCUS FAECALIS    Report Status 03/26/2024 FINAL  Final   Organism ID, Bacteria ESCHERICHIA COLI (A)  Final   Organism ID, Bacteria ENTEROCOCCUS FAECALIS (A)  Final      Susceptibility   Escherichia coli - MIC*    AMPICILLIN >=32 RESISTANT Resistant     CEFAZOLIN >=64 RESISTANT Resistant     CEFEPIME >=32 RESISTANT Resistant     CEFTRIAXONE  >=64 RESISTANT Resistant     CIPROFLOXACIN >=4 RESISTANT Resistant     GENTAMICIN <=1 SENSITIVE Sensitive     IMIPENEM <=0.25 SENSITIVE Sensitive     NITROFURANTOIN <=16 SENSITIVE Sensitive     TRIMETH/SULFA <=20 SENSITIVE Sensitive     AMPICILLIN/SULBACTAM >=32 RESISTANT Resistant     PIP/TAZO 64 INTERMEDIATE Intermediate ug/mL    * >=100,000 COLONIES/mL ESCHERICHIA COLI   Enterococcus faecalis - MIC*    AMPICILLIN <=2 SENSITIVE Sensitive     NITROFURANTOIN <=16 SENSITIVE Sensitive     VANCOMYCIN  1 SENSITIVE Sensitive     * 40,000 COLONIES/mL ENTEROCOCCUS FAECALIS  Culture, blood (Routine x 2)     Status: None (Preliminary result)   Collection Time: 03/23/24  4:55 PM   Specimen: BLOOD LEFT ARM  Result Value Ref Range Status   Specimen Description BLOOD  LEFT ARM  Final   Special Requests   Final    BOTTLES DRAWN AEROBIC ONLY Blood Culture results may not be optimal due to an inadequate volume of blood received in culture bottles   Culture   Final    NO GROWTH 3 DAYS Performed at [facility], [address], [place], [place] [address]    Report Status PENDING  Incomplete  C Difficile Quick Screen w PCR reflex     Status: Abnormal   Collection Time: 03/25/24 12:14 AM   Specimen: STOOL  Result Value Ref Range Status   C Diff antigen POSITIVE (A) NEGATIVE Final   C Diff toxin  NEGATIVE NEGATIVE Final   C Diff interpretation Results are indeterminate. See PCR results.  Final    Comment: Performed at [facility], [address], [place], [place] [address]  C. Diff by PCR, Reflexed     Status: Abnormal   Collection Time: 03/25/24 12:14 AM  Result Value Ref Range Status   Toxigenic C. Difficile by PCR POSITIVE (A) NEGATIVE Final    Comment: Positive for toxigenic C. difficile with little to no toxin production. Only treat if clinical presentation suggests symptomatic illness. Performed at [facility], [address], [place], [place] [address]      Radiology Studies: No results found.   Nilda Fendt, MD, PhD [facility]  Between 7 am - 7 pm I am available, please contact me via Amion (for emergencies) or Securechat (non urgent messages)  Between 7 pm - 7 am I am not available, please contact night coverage MD/APP via Amion

## 2024-03-26 NOTE — Progress Notes (Signed)
 Physical Therapy Wound Treatment Patient Details  Name: [name] MRN: [id number] Date of Birth: [birth year]  Today's Date: 03/26/2024 Time: 1139-1223 Time Calculation (min): 44 min  Subjective  Subjective Assessment Patient and Family Stated Goals: heal wound Date of Onset:  (unknown) Prior Treatments: dressing changes  Pain Score:  9/10 with debridement, pain medication offered by RN prior to session   Wound Assessment  Wound 03/24/24 Pressure Injury Sacrum Stage 4 - Full thickness tissue loss with exposed bone, tendon or muscle. (Active)  Wound Image   03/26/24 1200  Site / Wound Assessment Painful;Pink;Yellow;Red 03/26/24 1200  Peri-wound Assessment Pink;Denuded;Edema;Induration 03/26/24 1200  Wound Length (cm) 2.2 cm 03/26/24 1200  Wound Width (cm) 2.3 cm 03/26/24 1200  Wound Surface Area (cm^2) 3.97 cm^2 03/26/24 1200  Wound Depth (cm) 2.1 cm 03/26/24 1200  Wound Volume (cm^3) 5.564 cm^3 03/26/24 1200  Drainage Description Serosanguineous 03/26/24 1200  Drainage Amount Small 03/26/24 1200  Treatment Debridement (Selective);Irrigation 03/26/24 1200  Dressing Type Foam - Lift dressing to assess site every shift;Dakin's-soaked gauze 03/26/24 1200  Dressing Changed Changed 03/26/24 1200  Dressing Status Clean, Dry, Intact 03/26/24 1200  State of Healing Early/partial granulation 03/26/24 1200  % Wound base Red or Granulating 5% 03/26/24 1200  % Wound base Yellow/Fibrinous Exudate 70% 03/26/24 1200  % Wound base Black/Eschar 25% 03/26/24 1200  % Wound base Other/Granulation Tissue (Comment) 0% 03/26/24 1200  Undermining (cm) circumfrential, 12:00 2.2 cm, 3:00 1.3 cm, 6:00 1.2 cm, 9:00 1.8 cm 03/26/24 1200  Margins Unattached edges (unapproximated) 03/26/24 1200   Selective Debridement (non-excisional) Selective Debridement (non-excisional) - Location: sacrum Selective Debridement (non-excisional) - Tools Used: Forceps, Scalpel, Scissors Selective Debridement  (non-excisional) - Tissue Removed: stringy, yellowish slough, brown black softened eschar    Wound Assessment and Plan  Wound Therapy - Assess/Plan/Recommendations Wound Therapy - Clinical Statement: Pt has advanced stage 4 wound, bone felt below thin layer of slough. Dark eschar is softening to brownish grey slough at the base of the wound. Appreciable amount of necrotic material removed from base of the wound. There is undermining around the entire of circumfrence of the wound with brown grey slough that is softening. Pt is limited in debridement tolerance due to pain today. Pt will continue to benefit from sharps debridement to remove necrotic material and reduce bioburden to  promote healing Wound Therapy - Functional Problem List: decreased mobility, incontinence Factors Delaying/Impairing Wound Healing: Incontinence, Immobility, Multiple medical problems, Polypharmacy Hydrotherapy Plan: Debridement, Patient/family education, Dressing change Wound Therapy - Frequency: 2X / week Wound Therapy - Follow Up Recommendations: dressing changes by RN  Wound Therapy Goals- Improve the function of patient's integumentary system by progressing the wound(s) through the phases of wound healing (inflammation - proliferation - remodeling) by: Wound Therapy Goals - Improve the function of patient's integumentary system by progressing the wound(s) through the phases of wound healing by: Decrease Necrotic Tissue to: 90 Decrease Necrotic Tissue - Progress: Goal set today Increase Granulation Tissue to: 10 Increase Granulation Tissue - Progress: Goal set today Goals/treatment plan/discharge plan were made with and agreed upon by patient/family: Yes Time For Goal Achievement: 7 days Wound Therapy - Potential for Goals: Poor  Goals will be updated until maximal potential achieved or discharge criteria met.  Discharge criteria: when goals achieved, discharge from hospital, MD decision/surgical intervention, no  progress towards goals, refusal/missing three consecutive treatments without notification or medical reason.  GP     Charges PT Wound Care Charges $Wound Debridement up to  20 cm: < or equal to 20 cm $PT Hydrotherapy Visit: 1 Visit     Anhad Sheeley B. Fleeta Lapidus PT, DPT Acute Rehabilitation Services Please use secure chat or  Call Office [phone]   Almarie KATHEE Fleeta [facility] 03/26/2024, 3:15 PM

## 2024-03-26 NOTE — Progress Notes (Signed)
 Nutrition Follow-up  DOCUMENTATION CODES:   Not applicable  INTERVENTION:  -Continue Jevity 1.5 continuous @ 60 ml/hr via PEG tube. -Continue free water  flushes 200 ml q-4 at this time, monitor closely for need to increase r/t fluid loss from loose stools.  -Continue Juven BID to promote wound healing -Continue ProSource TF20 qday to increase protein to promote wound healing -Continue Banatrol BID to assist with loose stools   NUTRITION DIAGNOSIS:   Inadequate oral intake related to inability to eat as evidenced by NPO status.  Continues to be relevant   GOAL:   Patient will meet greater than or equal to 90% of their needs  Met with EN   MONITOR:   PO intake, Labs, TF tolerance, Skin, Weight trends  REASON FOR ASSESSMENT:   Consult Enteral/tube feeding initiation and management  ASSESSMENT:   HTN, HLD, CAD, CVA, chronic debility and essentially bedbound and wheelchair-bound, CKD 3A . ED for increased lethargy. Suspect osteomyelitis, UTI.  Spoke to pt's daughter in room. No noted s/sx of EN intolerance, though, does continue with loose stools r/t positive C. Diff, has FMS. Pt is on Jevity 1.5 continuous @ 60 ml/hr vie PEG at baseline. Pt also with Stage IV PU to sacrum, continue Juven BID, continue ProSource TF20 to promote increased protein intake. FWF @ 200 ml q-4, may need to increase r/t large amount of loose stools. NFPE performed (see below). Pt's daughter is a Engineer, civil (consulting), pt lives with her and is her caregiver. Will continue to monitor, RDN available prn.   Jevity 1.5 continuous @ goal rate 60 ml/hr via PEG tube. FWF 200 ml q-4 via PEG tube.              At goal this provides 2160 kcal, 92 g protein, 1441 ml fluid from formula.    Formula + flushes = 2641 ml/day   +ProSource TF 20 adds an additional 80 kcal, 20 g protein totaling  2240 kcal, 112 g protein   Labs BG 98-101 BUN 50 Cr 1.67 Phos 1.7 Albumin  <1.5 GFR 29 CRP 11.9 H/H  7.0/21.8  Medications Meropenem  Vancomycin  K Phos    NUTRITION - FOCUSED PHYSICAL EXAM:  Flowsheet Row Most Recent Value  Orbital Region No depletion  Upper Arm Region Mild depletion  Thoracic and Lumbar Region No depletion  Buccal Region No depletion  Temple Region No depletion  Clavicle Bone Region Mild depletion  Clavicle and Acromion Bone Region Mild depletion  Scapular Bone Region No depletion  Dorsal Hand No depletion  Patellar Region Mild depletion  Anterior Thigh Region Mild depletion  Posterior Calf Region Mild depletion  Edema (RD Assessment) Mild  Hair Reviewed  Eyes Reviewed  Mouth Unable to assess  [Pt not following this command]  Skin Reviewed  Nails Reviewed    Diet Order:   Diet Order             Diet NPO time specified Except for: Sips with Meds  Diet effective now                   EDUCATION NEEDS:   Not appropriate for education at this time  Skin:  Skin Assessment: Skin Integrity Issues: Skin Integrity Issues:: Stage IV Stage IV: Sacrum  Last BM:  7/17 C. Diff  Height:   Ht Readings from Last 1 Encounters:  03/24/24 5' 5 (1.651 m)    Weight:   Wt Readings from Last 1 Encounters:  03/26/24 83 kg    BMI:  Body mass  index is 30.45 kg/m.  Estimated Nutritional Needs:   Kcal:  2000-2400 kcal  Protein:  85-115 g  Fluid:  >/=2L   Isiac Breighner Daml-Budig, RDN, LDN Registered Dietitian Nutritionist RD Inpatient Contact Info in [place]

## 2024-03-26 NOTE — Plan of Care (Signed)

## 2024-03-27 DIAGNOSIS — R6521 Severe sepsis with septic shock: Secondary | ICD-10-CM

## 2024-03-27 DIAGNOSIS — A419 Sepsis, unspecified organism: Secondary | ICD-10-CM | POA: Diagnosis not present

## 2024-03-27 DIAGNOSIS — R509 Fever, unspecified: Secondary | ICD-10-CM | POA: Diagnosis not present

## 2024-03-27 LAB — GLUCOSE, CAPILLARY
Glucose-Capillary: 104 mg/dL — ABNORMAL HIGH (ref 70–99)
Glucose-Capillary: 105 mg/dL — ABNORMAL HIGH (ref 70–99)
Glucose-Capillary: 106 mg/dL — ABNORMAL HIGH (ref 70–99)
Glucose-Capillary: 107 mg/dL — ABNORMAL HIGH (ref 70–99)
Glucose-Capillary: 107 mg/dL — ABNORMAL HIGH (ref 70–99)
Glucose-Capillary: 108 mg/dL — ABNORMAL HIGH (ref 70–99)
Glucose-Capillary: 88 mg/dL (ref 70–99)

## 2024-03-27 LAB — CBC
HCT: 27.9 % — ABNORMAL LOW (ref 36.0–46.0)
Hemoglobin: 9 g/dL — ABNORMAL LOW (ref 12.0–15.0)
MCH: 25.9 pg — ABNORMAL LOW (ref 26.0–34.0)
MCHC: 32.3 g/dL (ref 30.0–36.0)
MCV: 80.2 fL (ref 80.0–100.0)
Platelets: 463 K/uL — ABNORMAL HIGH (ref 150–400)
RBC: 3.48 MIL/uL — ABNORMAL LOW (ref 3.87–5.11)
RDW: 17.8 % — ABNORMAL HIGH (ref 11.5–15.5)
WBC: 9.2 K/uL (ref 4.0–10.5)
nRBC: 0 % (ref 0.0–0.2)

## 2024-03-27 LAB — COMPREHENSIVE METABOLIC PANEL WITH GFR
ALT: 15 U/L (ref 0–44)
AST: 23 U/L (ref 15–41)
Albumin: <1.5 g/dL — ABNORMAL LOW (ref 3.5–5.0)
Alkaline Phosphatase: 58 U/L (ref 38–126)
Anion gap: 8 (ref 5–15)
BUN: 66 mg/dL — ABNORMAL HIGH (ref 8–23)
CO2: 22 mmol/L (ref 22–32)
Calcium: 9.1 mg/dL (ref 8.9–10.3)
Chloride: 104 mmol/L (ref 98–111)
Creatinine, Ser: 1.58 mg/dL — ABNORMAL HIGH (ref 0.44–1.00)
GFR, Estimated: 31 mL/min — ABNORMAL LOW (ref >60)
Glucose, Bld: 104 mg/dL — ABNORMAL HIGH (ref 70–99)
Potassium: 4.7 mmol/L (ref 3.5–5.1)
Sodium: 134 mmol/L — ABNORMAL LOW (ref 135–145)
Total Bilirubin: 0.6 mg/dL (ref 0.0–1.2)
Total Protein: 5.1 g/dL — ABNORMAL LOW (ref 6.5–8.1)

## 2024-03-27 LAB — MAGNESIUM: Magnesium: 1.9 mg/dL (ref 1.7–2.4)

## 2024-03-27 LAB — VANCOMYCIN, RANDOM: Vancomycin Rm: 19 ug/mL

## 2024-03-27 LAB — PHOSPHORUS: Phosphorus: 3 mg/dL (ref 2.5–4.6)

## 2024-03-27 MED ORDER — VANCOMYCIN HCL IN DEXTROSE 1-5 GM/200ML-% IV SOLN
1000.0000 mg | INTRAVENOUS | Status: DC
  Administered 2024-03-27: 1000 mg via INTRAVENOUS
  Filled 2024-03-27: qty 200

## 2024-03-27 MED ORDER — MORPHINE SULFATE (CONCENTRATE) 10 MG /0.5 ML PO SOLN
20.0000 mg | ORAL | Status: DC
  Administered 2024-03-28: 10 mg via ORAL
  Filled 2024-03-27: qty 1

## 2024-03-27 MED ORDER — FENTANYL CITRATE PF 50 MCG/ML IJ SOSY: 25.0000 ug | PREFILLED_SYRINGE | INTRAMUSCULAR | Status: DC | PRN

## 2024-03-27 NOTE — Progress Notes (Signed)
 PROGRESS NOTE  [name] FMW:[id number] DOB: Sep 20, 1933 DOA: 03/23/2024 PCP: Pcp, No   LOS: 4 days   Brief Narrative / Interim history: 89 year old female with HTN, HLD, CAD, CVA, chronic debility and essentially bedbound and wheelchair-bound, CKD 3A comes into the hospital with worsening lethargy.  [name] is an Charity fundraiser and primary caregiver.  She was admitted to the hospital with empiric diagnosis of sacral osteomyelitis and UTI.  In hospital day 1 she also has had profuse diarrhea requiring rectal tube placement.  C. difficile was positive  Subjective / 24h Interval events: She is awake and alert this morning.  No further fevers overnight.  Denies any abdominal pain, no nausea or vomiting.  Assesement and Plan: Principal problem Severe sepsis secondary to sacral osteomyelitis, UTI -she met sepsis criteria on admission with fever, hypotension, tachypnea as well as leukocytosis.  CT scan on admission with sacral wound overlying the distal sacrococcygeal spine with suspicion for underlying osteomyelitis. - Has been placed on antibiotics, has a history of ESBL, continue, she is growing ESBL E. coli in her urine - She is finally afebrile, leukocytosis resolved.  Awaiting goals of care meeting  Active problems Possible C. difficile colitis -patient has had profuse diarrhea on 7/15 with 10+ liquid bowel movements requiring rectal tube placement.  C. difficile was positive but negative toxin.  Given clinical picture with numerous loose bowel movements, treat with p.o. vancomycin  - Diarrhea is slowing down  Possible UTI-continue antibiotics, urine cultures with ESBL E. coli as well as Enterococcus  Acute kidney injury on chronic kidney disease stage IIIb-baseline creatinine ranging 1.4-1.7, this admission up to 2.5.  Creatinine is back to baseline and has remained stable  Suspect underlying cognitive issues -monitor mental status closely, at risk for delirium  Anemia-in the setting  of acute illness, infection, chronic kidney disease.  There is also dilutional component as she received fluids.  Hemoglobin dipped to 6.9 on 7/17, received a unit of packed red blood cells, improved appropriately.  No bleeding  History of CVA -continue Xarelto , she has a PEG tube and is essentially bedbound.  PAF-continue Xarelto   Urinary retention-had to have Foley placed  Hyponatremia - improved with fluids  Goals of care-palliative care consulted.  I have discussed with both daughters [name] and Therisa, they are trying to get all the siblings together hopefully soon.  Palliative care involved as well  Scheduled Meds:  sodium chloride    Intravenous Once   bisacodyl   10 mg Rectal Once   Chlorhexidine  Gluconate Cloth  6 each Topical Daily   docusate  100 mg Per Tube Daily   feeding supplement (PROSource TF20)  60 mL Per Tube Daily   fiber supplement (BANATROL TF)  60 mL Per Tube BID   free water   200 mL Per Tube Q4H   Gerhardt's butt cream  1 Application Topical TID   nutrition supplement (JUVEN)  1 packet Per Tube BID   polyethylene glycol  17 g Oral Daily   Rivaroxaban   15 mg Per Tube Q supper   sodium hypochlorite   Irrigation Daily   vancomycin   125 mg Per Tube QID   vancomycin  variable dose per unstable renal function (pharmacist dosing)   Does not apply See admin instructions   Continuous Infusions:  feeding supplement (JEVITY 1.5 CAL/FIBER) 1,000 mL (03/27/24 9191)   meropenem  (MERREM ) IV 500 mg (03/27/24 0855)   PRN Meds:.acetaminophen  **OR** acetaminophen , morphine  CONCENTRATE  Current Outpatient Medications  Medication Instructions   acetaminophen  (TYLENOL ) 1,000 mg,  Oral, Every 6 hours PRN   aspirin  81 mg, Oral, Daily PRN   doxycycline (VIBRAMYCIN) 100 mg, 2 times daily   ergocalciferol  (VITAMIN D2) 50,000 Units, Every 30 days   hydrALAZINE  (APRESOLINE ) 25 mg, Oral, 2 times daily   ipratropium-albuterol (DUONEB) 0.5-2.5 (3) MG/3ML SOLN 3 mLs, Inhalation, Every 4  hours PRN   LUMIGAN 0.01 % SOLN 1 drop, Both Eyes, Daily at bedtime   metoprolol  succinate (TOPROL -XL) 12.5 mg, Oral, Daily   morphine  CONCENTRATE 20 mg, Oral, Every 4 hours PRN   nitroGLYCERIN  (NITROSTAT ) 0.4 MG SL tablet PLACE 1 TABLET UNDER THE TONGUE EVERY 5 MINUTES AS NEEDED.   Rivaroxaban  (XARELTO ) 15 mg, Oral, Daily with supper   rosuvastatin  (CRESTOR ) 40 MG tablet TAKE 1 TABLET BY MOUTH EVERY DAY   Sodium Chloride  Flush (SALINE FLUSH IV) Intravenous, Every 4 hours   Soft Lens Products (SENSITIVE EYES SALINE) SOLN 1 drop, Does not apply, 2 times daily    Diet Orders (From admission, onward)     Start     Ordered   03/23/24 1347  Diet NPO time specified Except for: Sips with Meds  Diet effective now       Question:  Except for  Answer:  Sips with Meds   03/23/24 1350            DVT prophylaxis:  Rivaroxaban  (XARELTO ) tablet 15 mg   Lab Results  Component Value Date   PLT 463 (H) 03/27/2024      Code Status: Full Code  Family Communication: daughter at bedside   Status is: Inpatient Remains inpatient appropriate because: severity of illness  Level of care: Progressive  Consultants:  Palliative care  Objective: Vitals:   03/27/24 0214 03/27/24 0300 03/27/24 0352 03/27/24 0748  BP: 102/60  94/66 (!) 110/58  Pulse:   67 73  Resp:   15 19  Temp: 98 F (36.7 C)  (!) 97.5 F (36.4 C) (!) 97.4 F (36.3 C)  TempSrc: Oral Axillary Axillary Axillary  SpO2:   99% 99%  Weight:   82.6 kg   Height:        Intake/Output Summary (Last 24 hours) at 03/27/2024 0956 Last data filed at 03/27/2024 0800 Gross per 24 hour  Intake 1554.17 ml  Output 400 ml  Net 1154.17 ml   Wt Readings from Last 3 Encounters:  03/27/24 82.6 kg  08/21/23 89.8 kg  06/21/23 90.5 kg    Examination:  Constitutional: NAD Eyes: lids and conjunctivae normal, no scleral icterus ENMT: mmm Neck: normal, supple Respiratory: clear to auscultation bilaterally, no wheezing, no crackles.  Normal respiratory effort.  Cardiovascular: Regular rate and rhythm, no murmurs / rubs / gallops. No LE edema. Abdomen: soft, no distention, no tenderness. Bowel sounds positive.     Data Reviewed: I have independently reviewed following labs and imaging studies   CBC Recent Labs  Lab 03/23/24 0748 03/24/24 0322 03/25/24 0242 03/26/24 0250 03/26/24 1038 03/27/24 0813  WBC 12.4* 19.4* 14.4* 11.0*  --  9.2  HGB 8.5* 8.0* 7.9* 6.9* 7.0* 9.0*  HCT 27.2* 25.4* 25.7* 21.8* 21.8* 27.9*  PLT 493* 485* 509* 501*  --  463*  MCV 81.7 81.9 83.7 81.6  --  80.2  MCH 25.5* 25.8* 25.7* 25.8*  --  25.9*  MCHC 31.3 31.5 30.7 31.7  --  32.3  RDW 16.6* 16.6* 17.0* 17.3*  --  17.8*  LYMPHSABS 2.2  --  1.6  --   --   --  MONOABS 0.9  --  0.9  --   --   --   EOSABS 0.1  --  0.3  --   --   --   BASOSABS 0.0  --  0.0  --   --   --     Recent Labs  Lab 03/23/24 0748 03/23/24 0812 03/23/24 0843 03/23/24 1655 03/24/24 0322 03/25/24 0242 03/26/24 0250 03/27/24 0220 03/27/24 0813  NA 132*  --   --   --  135 140 136  --  134*  K 5.1  --   --   --  4.7 4.1 4.5  --  4.7  CL 101  --   --   --  107 110 109  --  104  CO2 23  --   --   --  22 21* 21*  --  22  GLUCOSE 133*  --   --   --  131* 122* 101*  --  104*  BUN 57*  --   --   --  49* 45* 50*  --  66*  CREATININE 2.55*  --   --   --  2.20* 1.90* 1.67*  --  1.58*  CALCIUM  11.5*  --   --  10.8* 10.6*  10.6* 9.8 9.1  --  9.1  AST 33  --   --   --  22 22 21   --  23  ALT 16  --   --   --  12 13 13   --  15  ALKPHOS 89  --   --   --  71 79 61  --  58  BILITOT 0.7  --   --   --  0.6 0.4 0.5  --  0.6  ALBUMIN  1.6*  --   --   --  1.8* 1.7* <1.5*  --  <1.5*  MG  --   --   --  2.1  --  2.0 1.9 1.9  --   CRP  --   --   --   --   --  11.9*  --   --   --   LATICACIDVEN  --  1.5  --   --   --   --   --   --   --   INR  --   --  1.3*  --  1.3*  --   --   --   --      ------------------------------------------------------------------------------------------------------------------ No results for input(s): CHOL, HDL, LDLCALC, TRIG, CHOLHDL, LDLDIRECT in the last 72 hours.  Lab Results  Component Value Date   HGBA1C 6.0 (H) 08/20/2022   ------------------------------------------------------------------------------------------------------------------ No results for input(s): TSH, T4TOTAL, T3FREE, THYROIDAB in the last 72 hours.  Invalid input(s): FREET3  Cardiac Enzymes No results for input(s): CKMB, TROPONINI, MYOGLOBIN in the last 168 hours.  Invalid input(s): CK ------------------------------------------------------------------------------------------------------------------    Component Value Date/Time   BNP 43.0 06/21/2023 1037    CBG: Recent Labs  Lab 03/26/24 1644 03/26/24 2050 03/27/24 0042 03/27/24 0400 03/27/24 0812  GLUCAP 108* 99 104* 107* 108*    Recent Results (from the past 240 hours)  Resp panel by RT-PCR (RSV, Flu A&B, Covid) In/Out Cath Urine     Status: None   Collection Time: 03/23/24  7:56 AM   Specimen: In/Out Cath Urine; Nasal Swab  Result Value Ref Range Status   SARS Coronavirus 2 by RT PCR NEGATIVE NEGATIVE Final   Influenza A by PCR NEGATIVE NEGATIVE Final  Influenza B by PCR NEGATIVE NEGATIVE Final    Comment: (NOTE) The Xpert Xpress SARS-CoV-2/FLU/RSV plus assay is intended as an aid in the diagnosis of influenza from Nasopharyngeal swab specimens and should not be used as a sole basis for treatment. Nasal washings and aspirates are unacceptable for Xpert Xpress SARS-CoV-2/FLU/RSV testing.  Fact Sheet for Patients: BloggerCourse.com  Fact Sheet for Healthcare Providers: SeriousBroker.it  This test is not yet approved or cleared by the [place]  FDA and has been authorized for detection and/or diagnosis of  SARS-CoV-2 by FDA under an Emergency Use Authorization (EUA). This EUA will remain in effect (meaning this test can be used) for the duration of the COVID-19 declaration under Section 564(b)(1) of the Act, 21 U.S.C. section 360bbb-3(b)(1), unless the authorization is terminated or revoked.     Resp Syncytial Virus by PCR NEGATIVE NEGATIVE Final    Comment: (NOTE) Fact Sheet for Patients: BloggerCourse.com  Fact Sheet for Healthcare Providers: SeriousBroker.it  This test is not yet approved or cleared by the [place]  FDA and has been authorized for detection and/or diagnosis of SARS-CoV-2 by FDA under an Emergency Use Authorization (EUA). This EUA will remain in effect (meaning this test can be used) for the duration of the COVID-19 declaration under Section 564(b)(1) of the Act, 21 U.S.C. section 360bbb-3(b)(1), unless the authorization is terminated or revoked.  Performed at [facility], [address] [place], [place] [address]   Culture, blood (Routine x 2)     Status: None (Preliminary result)   Collection Time: 03/23/24  8:02 AM   Specimen: BLOOD RIGHT ARM  Result Value Ref Range Status   Specimen Description BLOOD RIGHT ARM  Final   Special Requests   Final    BOTTLES DRAWN AEROBIC AND ANAEROBIC Blood Culture results may not be optimal due to an inadequate volume of blood received in culture bottles   Culture   Final    NO GROWTH 4 DAYS Performed at [facility], [address] [place], [place] [address]    Report Status PENDING  Incomplete  Urine Culture     Status: Abnormal   Collection Time: 03/23/24 11:03 AM   Specimen: Urine, Random  Result Value Ref Range Status   Specimen Description URINE, RANDOM  Final   Special Requests   Final    NONE Reflexed from [id number] Performed at [facility], [address] [place], [place] [address]    Culture (A)  Final    >=100,000 COLONIES/mL  ESCHERICHIA COLI Confirmed Extended Spectrum Beta-Lactamase Producer (ESBL).  In bloodstream infections from ESBL organisms, carbapenems are preferred over piperacillin/tazobactam. They are shown to have a lower risk of mortality. 40,000 COLONIES/mL ENTEROCOCCUS FAECALIS    Report Status 03/26/2024 FINAL  Final   Organism ID, Bacteria ESCHERICHIA COLI (A)  Final   Organism ID, Bacteria ENTEROCOCCUS FAECALIS (A)  Final      Susceptibility   Escherichia coli - MIC*    AMPICILLIN >=32 RESISTANT Resistant     CEFAZOLIN >=64 RESISTANT Resistant     CEFEPIME >=32 RESISTANT Resistant     CEFTRIAXONE  >=64 RESISTANT Resistant     CIPROFLOXACIN >=4 RESISTANT Resistant     GENTAMICIN <=1 SENSITIVE Sensitive     IMIPENEM <=0.25 SENSITIVE Sensitive     NITROFURANTOIN <=16 SENSITIVE Sensitive     TRIMETH/SULFA <=20 SENSITIVE Sensitive     AMPICILLIN/SULBACTAM >=32 RESISTANT Resistant     PIP/TAZO 64 INTERMEDIATE Intermediate ug/mL    * >=  100,000 COLONIES/mL ESCHERICHIA COLI   Enterococcus faecalis - MIC*    AMPICILLIN <=2 SENSITIVE Sensitive     NITROFURANTOIN <=16 SENSITIVE Sensitive     VANCOMYCIN  1 SENSITIVE Sensitive     * 40,000 COLONIES/mL ENTEROCOCCUS FAECALIS  Culture, blood (Routine x 2)     Status: None (Preliminary result)   Collection Time: 03/23/24  4:55 PM   Specimen: BLOOD LEFT ARM  Result Value Ref Range Status   Specimen Description BLOOD LEFT ARM  Final   Special Requests   Final    BOTTLES DRAWN AEROBIC ONLY Blood Culture results may not be optimal due to an inadequate volume of blood received in culture bottles   Culture   Final    NO GROWTH 4 DAYS Performed at [facility], [address] [place], [place] [address]    Report Status PENDING  Incomplete  C Difficile Quick Screen w PCR reflex     Status: Abnormal   Collection Time: 03/25/24 12:14 AM   Specimen: STOOL  Result Value Ref Range Status   C Diff antigen POSITIVE (A) NEGATIVE Final   C Diff toxin  NEGATIVE NEGATIVE Final   C Diff interpretation Results are indeterminate. See PCR results.  Final    Comment: Performed at [facility], [address] [place], [place] [address]  C. Diff by PCR, Reflexed     Status: Abnormal   Collection Time: 03/25/24 12:14 AM  Result Value Ref Range Status   Toxigenic C. Difficile by PCR POSITIVE (A) NEGATIVE Final    Comment: Positive for toxigenic C. difficile with little to no toxin production. Only treat if clinical presentation suggests symptomatic illness. Performed at [facility], [address], [place], [place] [address]      Radiology Studies: No results found.   Nilda Fendt, MD, PhD [facility]  Between 7 am - 7 pm I am available, please contact me via Amion (for emergencies) or Securechat (non urgent messages)  Between 7 pm - 7 am I am not available, please contact night coverage MD/APP via Amion

## 2024-03-27 NOTE — Progress Notes (Signed)
 Carelink Summary Report / Loop Recorder

## 2024-03-27 NOTE — Progress Notes (Signed)
 Hydrotherapy Note    03/27/24 1133  Subjective Assessment  Patient and Family Stated Goals heal wound  Date of Onset  (unknown)  Prior Treatments dressing changes  Evaluation and Treatment  Evaluation and Treatment Procedures Explained to Patient/Family Yes  Evaluation and Treatment Procedures Patient unable to consent due to mental status;Other (comment) (daughter in room agreed to)  Wound 03/24/24 Pressure Injury Sacrum Stage 4 - Full thickness tissue loss with exposed bone, tendon or muscle.  Date First Assessed: 03/24/24   Present on Original Admission: Yes  Primary Wound Type: Pressure Injury  Location: Sacrum  Staging: Stage 4 - Full thickness tissue loss with exposed bone, tendon or muscle.  Site / Wound Assessment Painful;Pink;Yellow;Red  Peri-wound Assessment Pink;Denuded;Edema;Induration  Drainage Description Serosanguineous  Drainage Amount Small  Dressing Type Foam - Lift dressing to assess site every shift;Dakin's-soaked gauze  Dressing Changed Changed  Dressing Status Clean, Dry, Intact  State of Healing Early/partial granulation  % Wound base Red or Granulating 10%  % Wound base Yellow/Fibrinous Exudate 80%  % Wound base Black/Eschar 10%  Margins Unattached edges (unapproximated)  Selective Debridement (non-excisional)  Selective Debridement (non-excisional) - Location sacrum  Selective Debridement (non-excisional) - Tools Used Forceps;Scalpel;Scissors  Selective Debridement (non-excisional) - Tissue Removed stringy, yellowish slough, brown black softened eschar  largely removed  Wound Therapy - Assess/Plan/Recommendations  Wound Therapy - Clinical Statement Pt has advanced stage 4 wound, bone felt below thin layer of slough. Dark eschar is softening to brownish grey slough at the base of the wound. Appreciable amount of necrotic material removed from base of the wound. There is undermining around the entire of circumfrence of the wound with brown grey slough that is  softening. Pt is limited in debridement, but did debride a decent amount given poor tolerance due to pain today. Pt will continue to benefit from sharps debridement to remove necrotic material and reduce bioburden to  promote healing  Wound Therapy - Functional Problem List decreased mobility, incontinence  Factors Delaying/Impairing Wound Healing Incontinence;Immobility;Multiple medical problems;Polypharmacy  Hydrotherapy Plan Debridement;Patient/family education;Dressing change  Wound Therapy - Frequency 2X / week  Wound Therapy - Follow Up Recommendations dressing changes by RN  Wound Therapy Goals - Improve the function of patient's integumentary system by progressing the wound(s) through the phases of wound healing by:  Decrease Necrotic Tissue to 90  Increase Granulation Tissue to 10  Goals/treatment plan/discharge plan were made with and agreed upon by patient/family Yes  Time For Goal Achievement 7 days  Wound Therapy - Potential for Goals Poor

## 2024-03-27 NOTE — Progress Notes (Signed)
 Pharmacy Antibiotic Note  [name] is a 89 y.o. female admitted on 03/23/2024 with suspected osteomyelitis.  History of ESBL E. Coli UTI. Pharmacy has been consulted for vancomycin  dosing.  Patient with positive C. Diff antigen and diarrhea, on oral vancomycin  for 10 days. Received vancomycin  1g on 7/16. Scr has trended down from 1.90 to 1.58, leukocytosis is improving.   Random vanc level returned at 19, which is supratherapeutic at ~ 33 hours.   Estimated Ke 0.016 with 1 level kinetics. ~ 43 hr half life. Will likely clear to therapeutic goal range at the 48 hour mark. Will transition to scheduled regimen given continued renal improvement.  Plan: Transition to vancomycin  1g IV q48h, check levels at steady state Monitor for renal recovery and start scheduled regimen tomorrow Meropenem  500 mg IV q12h - renally adjusted Monitor culture data, clinical status, and renal function  Height: 5' 5 (165.1 cm) Weight: 82.6 kg (182 lb) IBW/kg (Calculated) : 57  Temp (24hrs), Avg:98.3 F (36.8 C), Min:97.4 F (36.3 C), Max:100.3 F (37.9 C)  Recent Labs  Lab 03/23/24 0748 03/23/24 0812 03/24/24 0322 03/25/24 0242 03/25/24 1221 03/26/24 0250 03/27/24 0220 03/27/24 0813  WBC 12.4*  --  19.4* 14.4*  --  11.0*  --  9.2  CREATININE 2.55*  --  2.20* 1.90*  --  1.67*  --  1.58*  LATICACIDVEN  --  1.5  --   --   --   --   --   --   VANCORANDOM  --   --   --   --  14  --  19  --     Estimated Creatinine Clearance: 26.6 mL/min (A) (by C-G formula based on SCr of 1.58 mg/dL (H)).    Allergies  Allergen Reactions   Atorvastatin Nausea And Vomiting   Contrast Media [Iodinated Contrast Media] Other (See Comments)    Nausea/Vomiting and Shortness of Breath   Darvon [Propoxyphene] Nausea And Vomiting   Codeine Nausea And Vomiting and Anxiety    Antimicrobials this admission: Vancomcyin 7/14 >>  Meropenem  7/14 >>   Microbiology results: 7/14 BCx: ngtd x4 7/14 UCx: > 100K E. Coli  ESBL, 40K E. Faecalis, pan-S 7/16 C. Diff: antigen positive, toxin negative    Thank you for allowing pharmacy to be a part of this patient's care.   Nidia Schaffer, PharmD PGY2 Cardiology Pharmacy Resident  Please check AMION for all [facility] Pharmacy phone numbers After 10:00 PM, call Main Pharmacy [phone] 03/27/2024 9:48 AM

## 2024-03-27 NOTE — Addendum Note (Signed)
 Addended by: TAWNI DRILLING D on: 03/27/2024 12:36 PM   Modules accepted: Orders

## 2024-03-28 DIAGNOSIS — A419 Sepsis, unspecified organism: Principal | ICD-10-CM

## 2024-03-28 DIAGNOSIS — R509 Fever, unspecified: Secondary | ICD-10-CM | POA: Diagnosis not present

## 2024-03-28 LAB — CULTURE, BLOOD (ROUTINE X 2)
Culture: NO GROWTH
Culture: NO GROWTH

## 2024-03-28 LAB — MAGNESIUM: Magnesium: 1.8 mg/dL (ref 1.7–2.4)

## 2024-03-28 LAB — GLUCOSE, CAPILLARY
Glucose-Capillary: 108 mg/dL — ABNORMAL HIGH (ref 70–99)
Glucose-Capillary: 108 mg/dL — ABNORMAL HIGH (ref 70–99)
Glucose-Capillary: 95 mg/dL (ref 70–99)
Glucose-Capillary: 104 mg/dL — ABNORMAL HIGH (ref 70–99)

## 2024-03-28 MED ORDER — MORPHINE SULFATE (CONCENTRATE) 10 MG /0.5 ML PO SOLN
10.0000 mg | Freq: Four times a day (QID) | ORAL | Status: DC
  Administered 2024-03-28 – 2024-04-03 (×25): 10 mg via ORAL
  Filled 2024-03-28 (×25): qty 0.5

## 2024-03-28 NOTE — Progress Notes (Signed)
 PROGRESS NOTE  [name] AUTH FMW:[id number] DOB: 02/01/1934 DOA: 03/23/2024 PCP: Pcp, No   LOS: 5 days   Brief Narrative / Interim history: 89 year old female with HTN, HLD, CAD, CVA, chronic debility and essentially bedbound and wheelchair-bound, CKD 3A comes into the hospital with worsening lethargy.  [name] is an [employer] fundraiser and primary caregiver.  She was admitted to the hospital with empiric diagnosis of sacral osteomyelitis and UTI.  In hospital day 1 she also has had profuse diarrhea requiring rectal tube placement.  C. difficile was positive  Subjective / 24h Interval events: She is opening her eyes today, she is in no apparent distress  Assesement and Plan: Principal problem Severe sepsis secondary to sacral osteomyelitis, UTI -she met sepsis criteria on admission with fever, hypotension, tachypnea as well as leukocytosis.  CT scan on admission with sacral wound overlying the distal sacrococcygeal spine with suspicion for underlying osteomyelitis.  She initially was placed on antibiotics. Palliative care was consulted given progressive decline, she is essentially bedbound, and an extremely high likelihood that her sacral wound will never heal but continue to contribute to worsening pain, suffering and loss of quality of life.  Goals of care discussions were held with the family on 7/18, now transitioned to comfort  Active problems C. difficile colitis -patient has had profuse diarrhea on 7/15 with 10+ liquid bowel movements requiring rectal tube placement.  C. difficile was positive but negative toxin.  Given clinical picture with numerous loose bowel movements, treat with p.o. vancomycin .  Complete a 10-day course for comfort purposes, as severe diarrhea can contribute to discomfort and soil the sacral wound ESBL and Enterococcus UTI  Acute kidney injury on chronic kidney disease stage IIIb  Suspect underlying cognitive issues Anemia-in the setting of acute illness, infection,  chronic kidney disease.  There is also dilutional component as she received fluids.  Hemoglobin dipped to 6.9 on 7/17, received a unit of packed red blood cells, improved appropriately.  No bleeding  History of CVA -continue PEG tube, is essentially bedbound. PAF Urinary retention Hyponatremia  Scheduled Meds:  sodium chloride    Intravenous Once   Chlorhexidine  Gluconate Cloth  6 each Topical Daily   feeding supplement (PROSource TF20)  60 mL Per Tube Daily   fiber supplement (BANATROL TF)  60 mL Per Tube BID   free water   200 mL Per Tube Q4H   Gerhardt's butt cream  1 Application Topical TID   morphine  CONCENTRATE  10 mg Oral Q6H   nutrition supplement (JUVEN)  1 packet Per Tube BID   Rivaroxaban   15 mg Per Tube Q supper   vancomycin   125 mg Per Tube QID   Continuous Infusions:  feeding supplement (JEVITY 1.5 CAL/FIBER) 1,000 mL (03/27/24 0808)   PRN Meds:.acetaminophen  **OR** acetaminophen , fentaNYL  (SUBLIMAZE ) injection  Current Outpatient Medications  Medication Instructions   acetaminophen  (TYLENOL ) 1,000 mg, Oral, Every 6 hours PRN   aspirin  81 mg, Oral, Daily PRN   doxycycline (VIBRAMYCIN) 100 mg, 2 times daily   ergocalciferol  (VITAMIN D2) 50,000 Units, Every 30 days   hydrALAZINE  (APRESOLINE ) 25 mg, Oral, 2 times daily   ipratropium-albuterol (DUONEB) 0.5-2.5 (3) MG/3ML SOLN 3 mLs, Inhalation, Every 4 hours PRN   LUMIGAN 0.01 % SOLN 1 drop, Both Eyes, Daily at bedtime   metoprolol  succinate (TOPROL -XL) 12.5 mg, Oral, Daily   morphine  CONCENTRATE 20 mg, Oral, Every 4 hours PRN   nitroGLYCERIN  (NITROSTAT ) 0.4 MG SL tablet PLACE 1 TABLET UNDER THE TONGUE EVERY 5 MINUTES AS  NEEDED.   Rivaroxaban  (XARELTO ) 15 mg, Oral, Daily with supper   rosuvastatin  (CRESTOR ) 40 MG tablet TAKE 1 TABLET BY MOUTH EVERY DAY   Sodium Chloride  Flush (SALINE FLUSH IV) Intravenous, Every 4 hours   Soft Lens Products (SENSITIVE EYES SALINE) SOLN 1 drop, Does not apply, 2 times daily    Diet  Orders (From admission, onward)     Start     Ordered   03/23/24 1347  Diet NPO time specified Except for: Sips with Meds  Diet effective now       Question:  Except for  Answer:  Sips with Meds   03/23/24 1350            DVT prophylaxis:  Rivaroxaban  (XARELTO ) tablet 15 mg   Lab Results  Component Value Date   PLT 463 (H) 03/27/2024      Code Status: Do not attempt resuscitation (DNR) - Comfort care  Family Communication: daughter at bedside   Status is: Inpatient Remains inpatient appropriate because: severity of illness  Level of care: Progressive  Consultants:  Palliative care  Objective: Vitals:   03/27/24 2348 03/28/24 0422 03/28/24 0500 03/28/24 0824  BP: (!) 101/52 (!) 113/45    Pulse: 74 71    Resp: 13 (!) 22    Temp: 98.6 F (37 C) 97.9 F (36.6 C)  97.9 F (36.6 C)  TempSrc: Oral Oral  Axillary  SpO2: 100% 100%    Weight:   81.9 kg   Height:        Intake/Output Summary (Last 24 hours) at 03/28/2024 1157 Last data filed at 03/28/2024 0800 Gross per 24 hour  Intake 1794 ml  Output 1104 ml  Net 690 ml   Wt Readings from Last 3 Encounters:  03/28/24 81.9 kg  08/21/23 89.8 kg  06/21/23 90.5 kg    Examination:  Constitutional: NAD Eyes: lids and conjunctivae normal, no scleral icterus ENMT: mmm Neck: normal, supple Respiratory: clear to auscultation bilaterally, no wheezing, no crackles. Normal respiratory effort.  Cardiovascular: Regular rate and rhythm, no murmurs / rubs / gallops. No LE edema. Abdomen: soft, no distention, no tenderness. Bowel sounds positive.    Data Reviewed: I have independently reviewed following labs and imaging studies   CBC Recent Labs  Lab 03/23/24 0748 03/24/24 0322 03/25/24 0242 03/26/24 0250 03/26/24 1038 03/27/24 0813  WBC 12.4* 19.4* 14.4* 11.0*  --  9.2  HGB 8.5* 8.0* 7.9* 6.9* 7.0* 9.0*  HCT 27.2* 25.4* 25.7* 21.8* 21.8* 27.9*  PLT 493* 485* 509* 501*  --  463*  MCV 81.7 81.9 83.7 81.6  --   80.2  MCH 25.5* 25.8* 25.7* 25.8*  --  25.9*  MCHC 31.3 31.5 30.7 31.7  --  32.3  RDW 16.6* 16.6* 17.0* 17.3*  --  17.8*  LYMPHSABS 2.2  --  1.6  --   --   --   MONOABS 0.9  --  0.9  --   --   --   EOSABS 0.1  --  0.3  --   --   --   BASOSABS 0.0  --  0.0  --   --   --     Recent Labs  Lab 03/23/24 0748 03/23/24 0812 03/23/24 0843 03/23/24 1655 03/24/24 0322 03/25/24 0242 03/26/24 0250 03/27/24 0220 03/27/24 0813 03/28/24 0241  NA 132*  --   --   --  135 140 136  --  134*  --   K 5.1  --   --   --  4.7 4.1 4.5  --  4.7  --   CL 101  --   --   --  107 110 109  --  104  --   CO2 23  --   --   --  22 21* 21*  --  22  --   GLUCOSE 133*  --   --   --  131* 122* 101*  --  104*  --   BUN 57*  --   --   --  49* 45* 50*  --  66*  --   CREATININE 2.55*  --   --   --  2.20* 1.90* 1.67*  --  1.58*  --   CALCIUM  11.5*  --   --  10.8* 10.6*  10.6* 9.8 9.1  --  9.1  --   AST 33  --   --   --  22 22 21   --  23  --   ALT 16  --   --   --  12 13 13   --  15  --   ALKPHOS 89  --   --   --  71 79 61  --  58  --   BILITOT 0.7  --   --   --  0.6 0.4 0.5  --  0.6  --   ALBUMIN  1.6*  --   --   --  1.8* 1.7* <1.5*  --  <1.5*  --   MG  --   --   --  2.1  --  2.0 1.9 1.9  --  1.8  CRP  --   --   --   --   --  11.9*  --   --   --   --   LATICACIDVEN  --  1.5  --   --   --   --   --   --   --   --   INR  --   --  1.3*  --  1.3*  --   --   --   --   --     ------------------------------------------------------------------------------------------------------------------ No results for input(s): CHOL, HDL, LDLCALC, TRIG, CHOLHDL, LDLDIRECT in the last 72 hours.  Lab Results  Component Value Date   HGBA1C 6.0 (H) 08/20/2022   ------------------------------------------------------------------------------------------------------------------ No results for input(s): TSH, T4TOTAL, T3FREE, THYROIDAB in the last 72 hours.  Invalid input(s): FREET3  Cardiac Enzymes No results for  input(s): CKMB, TROPONINI, MYOGLOBIN in the last 168 hours.  Invalid input(s): CK ------------------------------------------------------------------------------------------------------------------    Component Value Date/Time   BNP 43.0 06/21/2023 1037    CBG: Recent Labs  Lab 03/27/24 1608 03/27/24 2029 03/27/24 2352 03/28/24 0445 03/28/24 0822  GLUCAP 88 106* 107* 108* 108*    Recent Results (from the past 240 hours)  Resp panel by RT-PCR (RSV, Flu A&B, Covid) In/Out Cath Urine     Status: None   Collection Time: 03/23/24  7:56 AM   Specimen: In/Out Cath Urine; Nasal Swab  Result Value Ref Range Status   SARS Coronavirus 2 by RT PCR NEGATIVE NEGATIVE Final   Influenza A by PCR NEGATIVE NEGATIVE Final   Influenza B by PCR NEGATIVE NEGATIVE Final    Comment: (NOTE) The Xpert Xpress SARS-CoV-2/FLU/RSV plus assay is intended as an aid in the diagnosis of influenza from Nasopharyngeal swab specimens and should not be used as a sole basis for treatment. Nasal washings and aspirates are unacceptable for Xpert Xpress SARS-CoV-2/FLU/RSV testing.  Fact  Sheet for Patients: BloggerCourse.com  Fact Sheet for Healthcare Providers: SeriousBroker.it  This test is not yet approved or cleared by the [place]  FDA and has been authorized for detection and/or diagnosis of SARS-CoV-2 by FDA under an Emergency Use Authorization (EUA). This EUA will remain in effect (meaning this test can be used) for the duration of the COVID-19 declaration under Section 564(b)(1) of the Act, 21 U.S.C. section 360bbb-3(b)(1), unless the authorization is terminated or revoked.     Resp Syncytial Virus by PCR NEGATIVE NEGATIVE Final    Comment: (NOTE) Fact Sheet for Patients: BloggerCourse.com  Fact Sheet for Healthcare Providers: SeriousBroker.it  This test is not yet approved or cleared  by the [place]  FDA and has been authorized for detection and/or diagnosis of SARS-CoV-2 by FDA under an Emergency Use Authorization (EUA). This EUA will remain in effect (meaning this test can be used) for the duration of the COVID-19 declaration under Section 564(b)(1) of the Act, 21 U.S.C. section 360bbb-3(b)(1), unless the authorization is terminated or revoked.  Performed at [facility], [address] [place], [place] [address]   Culture, blood (Routine x 2)     Status: None   Collection Time: 03/23/24  8:02 AM   Specimen: BLOOD RIGHT ARM  Result Value Ref Range Status   Specimen Description BLOOD RIGHT ARM  Final   Special Requests   Final    BOTTLES DRAWN AEROBIC AND ANAEROBIC Blood Culture results may not be optimal due to an inadequate volume of blood received in culture bottles   Culture   Final    NO GROWTH 5 DAYS Performed at [facility], [address], [place], [place] [address]    Report Status 03/28/2024 FINAL  Final  Urine Culture     Status: Abnormal   Collection Time: 03/23/24 11:03 AM   Specimen: Urine, Random  Result Value Ref Range Status   Specimen Description URINE, RANDOM  Final   Special Requests   Final    NONE Reflexed from [id number] Performed at [facility], [address], [place], [place] [address]    Culture (A)  Final    >=100,000 COLONIES/mL ESCHERICHIA COLI Confirmed Extended Spectrum Beta-Lactamase Producer (ESBL).  In bloodstream infections from ESBL organisms, carbapenems are preferred over piperacillin/tazobactam. They are shown to have a lower risk of mortality. 40,000 COLONIES/mL ENTEROCOCCUS FAECALIS    Report Status 03/26/2024 FINAL  Final   Organism ID, Bacteria ESCHERICHIA COLI (A)  Final   Organism ID, Bacteria ENTEROCOCCUS FAECALIS (A)  Final      Susceptibility   Escherichia coli - MIC*    AMPICILLIN >=32 RESISTANT Resistant     CEFAZOLIN >=64 RESISTANT Resistant     CEFEPIME >=32 RESISTANT Resistant      CEFTRIAXONE  >=64 RESISTANT Resistant     CIPROFLOXACIN >=4 RESISTANT Resistant     GENTAMICIN <=1 SENSITIVE Sensitive     IMIPENEM <=0.25 SENSITIVE Sensitive     NITROFURANTOIN <=16 SENSITIVE Sensitive     TRIMETH/SULFA <=20 SENSITIVE Sensitive     AMPICILLIN/SULBACTAM >=32 RESISTANT Resistant     PIP/TAZO 64 INTERMEDIATE Intermediate ug/mL    * >=100,000 COLONIES/mL ESCHERICHIA COLI   Enterococcus faecalis - MIC*    AMPICILLIN <=2 SENSITIVE Sensitive     NITROFURANTOIN <=16 SENSITIVE Sensitive     VANCOMYCIN  1 SENSITIVE Sensitive     * 40,000 COLONIES/mL ENTEROCOCCUS FAECALIS  Culture, blood (Routine x 2)     Status: None   Collection  Time: 03/23/24  4:55 PM   Specimen: BLOOD LEFT ARM  Result Value Ref Range Status   Specimen Description BLOOD LEFT ARM  Final   Special Requests   Final    BOTTLES DRAWN AEROBIC ONLY Blood Culture results may not be optimal due to an inadequate volume of blood received in culture bottles   Culture   Final    NO GROWTH 5 DAYS Performed at [facility], [address] [place], [place] [address]    Report Status 03/28/2024 FINAL  Final  C Difficile Quick Screen w PCR reflex     Status: Abnormal   Collection Time: 03/25/24 12:14 AM   Specimen: STOOL  Result Value Ref Range Status   C Diff antigen POSITIVE (A) NEGATIVE Final   C Diff toxin NEGATIVE NEGATIVE Final   C Diff interpretation Results are indeterminate. See PCR results.  Final    Comment: Performed at [facility], [address], [place], [place] [address]  C. Diff by PCR, Reflexed     Status: Abnormal   Collection Time: 03/25/24 12:14 AM  Result Value Ref Range Status   Toxigenic C. Difficile by PCR POSITIVE (A) NEGATIVE Final    Comment: Positive for toxigenic C. difficile with little to no toxin production. Only treat if clinical presentation suggests symptomatic illness. Performed at [facility], [address], [place], [place] [address]      Radiology  Studies: No results found.   Nilda Fendt, MD, PhD [facility]  Between 7 am - 7 pm I am available, please contact me via Amion (for emergencies) or Securechat (non urgent messages)  Between 7 pm - 7 am I am not available, please contact night coverage MD/APP via Amion

## 2024-03-28 NOTE — Progress Notes (Signed)
 [name] [phone] [facility] Liaison Note:   Referral received for [facility].    Liaison plan to meet with patient and family tomorrow to discuss services and assess for appropriateness.    Thank you for the opportunity to participate in this patient's plan of care.   Nat Babe, BSN, [employer] [phone]

## 2024-03-28 NOTE — Plan of Care (Signed)
 Pt vital signs and assessment unremarkable. Pt family now understand that pt will probably not recover

## 2024-03-28 NOTE — Discharge Summary (Incomplete)
 Physician Discharge Summary  [name] FMW:[id number] DOB: 15-Feb-1934 DOA: 03/23/2024  PCP: Pcp, No  Admit date: 03/23/2024 Discharge date: 03/28/2024  Admitted From: home Disposition:  residential hospice  Home Health: none Equipment/Devices: none  Discharge Condition: stable CODE STATUS: DNR Diet Orders (From admission, onward)     Start     Ordered   03/23/24 1347  Diet NPO time specified Except for: Sips with Meds  Diet effective now       Question:  Except for  Answer:  Noralyn with Meds   03/23/24 1350            Brief Narrative / Interim history: 89 year old female with HTN, HLD, CAD, CVA, chronic debility and essentially bedbound and wheelchair-bound, CKD 3A comes into the hospital with worsening lethargy.  [name] is an Charity fundraiser and primary caregiver.  She was admitted to the hospital with empiric diagnosis of sacral osteomyelitis and UTI.  In hospital day 1 she also has had profuse diarrhea requiring rectal tube placement.  C. difficile was positive  Hospital Course / Discharge diagnoses: Principal Problem:   Fever Active Problems:   Septic shock (HCC)  Principal problem Severe sepsis secondary to sacral osteomyelitis, UTI -she met sepsis criteria on admission with fever, hypotension, tachypnea as well as leukocytosis.  CT scan on admission with sacral wound overlying the distal sacrococcygeal spine with suspicion for underlying osteomyelitis.  She initially was placed on antibiotics. Palliative care was consulted given progressive decline, she is essentially bedbound, and an extremely high likelihood that her sacral wound will never heal but continue to contribute to worsening pain, suffering and loss of quality of life.  Goals of care discussions were held with the family on 7/18, now transitioned to comfort   Active problems C. difficile colitis -patient has had profuse diarrhea on 7/15 with 10+ liquid bowel movements requiring rectal tube placement.  C.  difficile was positive but negative toxin.  Given clinical picture with numerous loose bowel movements, treat with p.o. vancomycin .  Complete a 10-day course for comfort purposes, as severe diarrhea can contribute to discomfort and soil the sacral wound ESBL and Enterococcus UTI  Acute kidney injury on chronic kidney disease stage IIIb  Suspect underlying cognitive issues Anemia-in the setting of acute illness, infection, chronic kidney disease.  There is also dilutional component as she received fluids.  Hemoglobin dipped to 6.9 on 7/17, received a unit of packed red blood cells, improved appropriately.  No bleeding  History of CVA -continue PEG tube, is essentially bedbound. PAF Urinary retention Hyponatremia  Discharge Instructions   Allergies as of 03/28/2024       Reactions   Atorvastatin Nausea And Vomiting   Contrast Media [iodinated Contrast Media] Other (See Comments)   Nausea/Vomiting and Shortness of Breath   Darvon [propoxyphene] Nausea And Vomiting   Codeine Nausea And Vomiting, Anxiety     Med Rec must be completed prior to using this [facility]***      Consultations: Palliative care  Procedures/Studies:  CT ABDOMEN PELVIS WO CONTRAST Result Date: 03/23/2024 CLINICAL DATA:  Fever and sacral pain EXAM: CT ABDOMEN AND PELVIS WITHOUT CONTRAST TECHNIQUE: Multidetector CT imaging of the abdomen and pelvis was performed following the standard protocol without IV contrast. RADIATION DOSE REDUCTION: This exam was performed according to the departmental dose-optimization program which includes automated exposure control, adjustment of the mA and/or kV according to patient size and/or use of iterative reconstruction technique. COMPARISON:  CT abdomen and pelvis dated 09/26/2018 FINDINGS:  Decreased sensitivity and specificity for detailed findings due to motion artifact. Lower chest: Mild right lower lobe bronchiectasis. Subsegmental mucous plugging in the left lower lobe where  there is compressive atelectasis. Questionable trace tree-in-bud nodules in the right lower lobe. Unchanged right lower lobe 5 mm pulmonary nodule (4:15). No specific follow-up imaging recommended. No pleural effusion or pneumothorax demonstrated. Partially imaged heart size is normal. Coronary artery calcifications. Hepatobiliary: No focal hepatic lesions. No intra or extrahepatic biliary ductal dilation. Trace pneumobilia, most notable in the left hepatic lobe, likely sequela prior sphincterotomy. Cholecystectomy. 7 mm rounded focus of hyperattenuation at the level of the ampulla (2:33). Pancreas: No focal lesions or main ductal dilation. Spleen: Normal in size without focal abnormality. Adrenals/Urinary Tract: No adrenal nodules. No suspicious renal mass on this noncontrast enhanced examination , calculi or hydronephrosis. Subtle layering hyperdensity within the right posterior urinary bladder. Stomach/Bowel: Moderate hiatal hernia. Percutaneous gastrostomy tube in-situ. The balloon is slightly retracted from the anterior gastric wall. No evidence of bowel wall thickening, distention, or inflammatory changes. Extensive colonic diverticulosis without acute diverticulitis. Large volume stool in the rectum. Appendix is not discretely seen. Vascular/Lymphatic: Aortic atherosclerosis. No enlarged abdominal or pelvic lymph nodes. Reproductive: No adnexal masses. Other: No free fluid, fluid collection, or free air. Musculoskeletal: Multilevel degenerative changes of the partially imaged thoracic and lumbar spine. Diffuse subcutaneous soft tissue edema overlying the posterior lumbosacral region. Sacral wound overlying the distal sacrococcygeal spine with findings suspicious for erosive changes of the underlying bony cortex (2:70). IMPRESSION: 1. Sacral wound overlying the distal sacrococcygeal spine with findings suspicious for underlying osteomyelitis. 2. Subtle layering hyperdensity within the right posterior urinary  bladder, which may represent blood products or debris. Recommend correlation with urinalysis. 3. Percutaneous gastrostomy tube in-situ. The balloon is slightly retracted from the anterior gastric wall. 4. Questionable trace tree-in-bud nodules in the right lower lobe, which may be infectious or inflammatory. 5. A 7 mm rounded hyperattenuating focus at the level of the ampulla, which appears new compared to 2020 may represent ingested material versus stone. The patient is unlikely to tolerate breath holding for MRCP. Recommend further evaluation with right upper quadrant ultrasound examination with dedicated evaluation of the ampulla. 6. Large volume stool in the rectum, which may reflect fecal impaction. 7. Aortic Atherosclerosis (ICD10-I70.0). Coronary artery calcifications. Assessment for potential risk factor modification, dietary therapy or pharmacologic therapy may be warranted, if clinically indicated. Electronically Signed   By: Limin  Xu M.D.   On: 03/23/2024 10:18   DG Chest 2 View Result Date: 03/23/2024 CLINICAL DATA:  Suspected sepsis. EXAM: CHEST - 2 VIEW COMPARISON:  08/05/2023. FINDINGS: Low lung volume. Mild atelectatic changes at the lung bases. Bilateral lung fields are otherwise clear. No acute consolidation or lung collapse. Bilateral costophrenic angles are clear. Stable cardio-mediastinal silhouette. Small to medium retrocardiac hiatal hernia noted. No acute osseous abnormalities. The soft tissues are within normal limits. IMPRESSION: No active cardiopulmonary disease. Electronically Signed   By: Ree Molt M.D.   On: 03/23/2024 08:39   CUP PACEART REMOTE DEVICE CHECK Result Date: 03/05/2024 ILR summary report received. Battery status OK. Normal device function. No new symptom, tachy, brady, or pause episodes. No new AF episodes. Monthly summary reports and ROV/PRN LA, CVRS   Subjective: - no chest pain, shortness of breath, no abdominal pain, nausea or vomiting.   Discharge  Exam: BP (!) 113/45 (BP Location: Right Arm)   Pulse 71   Temp 97.9 F (36.6 C) (Axillary)   Resp ROLLEN)  22   Ht 5' 5 (1.651 m)   Wt 81.9 kg   SpO2 100%   BMI 30.05 kg/m   General: Pt is alert, awake, not in acute distress Cardiovascular: RRR, S1/S2 +, no rubs, no gallops Respiratory: CTA bilaterally, no wheezing, no rhonchi Abdominal: Soft, NT, ND, bowel sounds + Extremities: no edema, no cyanosis  The results of significant diagnostics from this hospitalization (including imaging, microbiology, ancillary and laboratory) are listed below for reference.     Microbiology: Recent Results (from the past 240 hours)  Resp panel by RT-PCR (RSV, Flu A&B, Covid) In/Out Cath Urine     Status: None   Collection Time: 03/23/24  7:56 AM   Specimen: In/Out Cath Urine; Nasal Swab  Result Value Ref Range Status   SARS Coronavirus 2 by RT PCR NEGATIVE NEGATIVE Final   Influenza A by PCR NEGATIVE NEGATIVE Final   Influenza B by PCR NEGATIVE NEGATIVE Final    Comment: (NOTE) The Xpert Xpress SARS-CoV-2/FLU/RSV plus assay is intended as an aid in the diagnosis of influenza from Nasopharyngeal swab specimens and should not be used as a sole basis for treatment. Nasal washings and aspirates are unacceptable for Xpert Xpress SARS-CoV-2/FLU/RSV testing.  Fact Sheet for Patients: BloggerCourse.com  Fact Sheet for Healthcare Providers: SeriousBroker.it  This test is not yet approved or cleared by the [place]  FDA and has been authorized for detection and/or diagnosis of SARS-CoV-2 by FDA under an Emergency Use Authorization (EUA). This EUA will remain in effect (meaning this test can be used) for the duration of the COVID-19 declaration under Section 564(b)(1) of the Act, 21 U.S.C. section 360bbb-3(b)(1), unless the authorization is terminated or revoked.     Resp Syncytial Virus by PCR NEGATIVE NEGATIVE Final    Comment: (NOTE) Fact  Sheet for Patients: BloggerCourse.com  Fact Sheet for Healthcare Providers: SeriousBroker.it  This test is not yet approved or cleared by the [place]  FDA and has been authorized for detection and/or diagnosis of SARS-CoV-2 by FDA under an Emergency Use Authorization (EUA). This EUA will remain in effect (meaning this test can be used) for the duration of the COVID-19 declaration under Section 564(b)(1) of the Act, 21 U.S.C. section 360bbb-3(b)(1), unless the authorization is terminated or revoked.  Performed at [facility], [address] [place], [place] [address]   Culture, blood (Routine x 2)     Status: None   Collection Time: 03/23/24  8:02 AM   Specimen: BLOOD RIGHT ARM  Result Value Ref Range Status   Specimen Description BLOOD RIGHT ARM  Final   Special Requests   Final    BOTTLES DRAWN AEROBIC AND ANAEROBIC Blood Culture results may not be optimal due to an inadequate volume of blood received in culture bottles   Culture   Final    NO GROWTH 5 DAYS Performed at [facility], [address], [place], [place] [address]    Report Status 03/28/2024 FINAL  Final  Urine Culture     Status: Abnormal   Collection Time: 03/23/24 11:03 AM   Specimen: Urine, Random  Result Value Ref Range Status   Specimen Description URINE, RANDOM  Final   Special Requests   Final    NONE Reflexed from [id number] Performed at [facility], [address], [place], [place] [address]    Culture (A)  Final    >=100,000 COLONIES/mL ESCHERICHIA COLI Confirmed Extended Spectrum Beta-Lactamase Producer (ESBL).  In bloodstream infections from ESBL organisms,  carbapenems are preferred over piperacillin/tazobactam. They are shown to have a lower risk of mortality. 40,000 COLONIES/mL ENTEROCOCCUS FAECALIS    Report Status 03/26/2024 FINAL  Final   Organism ID, Bacteria ESCHERICHIA COLI (A)  Final   Organism ID, Bacteria  ENTEROCOCCUS FAECALIS (A)  Final      Susceptibility   Escherichia coli - MIC*    AMPICILLIN >=32 RESISTANT Resistant     CEFAZOLIN >=64 RESISTANT Resistant     CEFEPIME >=32 RESISTANT Resistant     CEFTRIAXONE  >=64 RESISTANT Resistant     CIPROFLOXACIN >=4 RESISTANT Resistant     GENTAMICIN <=1 SENSITIVE Sensitive     IMIPENEM <=0.25 SENSITIVE Sensitive     NITROFURANTOIN <=16 SENSITIVE Sensitive     TRIMETH/SULFA <=20 SENSITIVE Sensitive     AMPICILLIN/SULBACTAM >=32 RESISTANT Resistant     PIP/TAZO 64 INTERMEDIATE Intermediate ug/mL    * >=100,000 COLONIES/mL ESCHERICHIA COLI   Enterococcus faecalis - MIC*    AMPICILLIN <=2 SENSITIVE Sensitive     NITROFURANTOIN <=16 SENSITIVE Sensitive     VANCOMYCIN  1 SENSITIVE Sensitive     * 40,000 COLONIES/mL ENTEROCOCCUS FAECALIS  Culture, blood (Routine x 2)     Status: None   Collection Time: 03/23/24  4:55 PM   Specimen: BLOOD LEFT ARM  Result Value Ref Range Status   Specimen Description BLOOD LEFT ARM  Final   Special Requests   Final    BOTTLES DRAWN AEROBIC ONLY Blood Culture results may not be optimal due to an inadequate volume of blood received in culture bottles   Culture   Final    NO GROWTH 5 DAYS Performed at [facility], [address], [place], [place] [address]    Report Status 03/28/2024 FINAL  Final  C Difficile Quick Screen w PCR reflex     Status: Abnormal   Collection Time: 03/25/24 12:14 AM   Specimen: STOOL  Result Value Ref Range Status   C Diff antigen POSITIVE (A) NEGATIVE Final   C Diff toxin NEGATIVE NEGATIVE Final   C Diff interpretation Results are indeterminate. See PCR results.  Final    Comment: Performed at [facility], [address] [place], [place] [address]  C. Diff by PCR, Reflexed     Status: Abnormal   Collection Time: 03/25/24 12:14 AM  Result Value Ref Range Status   Toxigenic C. Difficile by PCR POSITIVE (A) NEGATIVE Final    Comment: Positive for toxigenic C. difficile  with little to no toxin production. Only treat if clinical presentation suggests symptomatic illness. Performed at [facility], [address], [place], [place] [address]      Labs: Basic Metabolic Panel: Recent Labs  Lab 03/23/24 [phone] 03/23/24 1655 03/24/24 0322 03/25/24 0242 03/25/24 1549 03/26/24 0250 03/27/24 0220 03/27/24 0813 03/28/24 0241  NA 132*  --  135 140  --  136  --  134*  --   K 5.1  --  4.7 4.1  --  4.5  --  4.7  --   CL 101  --  107 110  --  109  --  104  --   CO2 23  --  22 21*  --  21*  --  22  --   GLUCOSE 133*  --  131* 122*  --  101*  --  104*  --   BUN 57*  --  49* 45*  --  50*  --  66*  --  CREATININE 2.55*  --  2.20* 1.90*  --  1.67*  --  1.58*  --   CALCIUM  11.5* 10.8* 10.6*  10.6* 9.8  --  9.1  --  9.1  --   MG  --  2.1  --  2.0  --  1.9 1.9  --  1.8  PHOS  --   --  3.2  --  2.0* 1.7* 3.0  --   --    Liver Function Tests: Recent Labs  Lab 03/23/24 0748 03/24/24 0322 03/25/24 0242 03/26/24 0250 03/27/24 0813  AST 33 22 22 21 23   ALT 16 12 13 13 15   ALKPHOS 89 71 79 61 58  BILITOT 0.7 0.6 0.4 0.5 0.6  PROT 6.5 5.6* 5.5* 4.9* 5.1*  ALBUMIN  1.6* 1.8* 1.7* <1.5* <1.5*   CBC: Recent Labs  Lab 03/23/24 0748 03/24/24 0322 03/25/24 0242 03/26/24 0250 03/26/24 1038 03/27/24 0813  WBC 12.4* 19.4* 14.4* 11.0*  --  9.2  NEUTROABS 9.2*  --  11.5*  --   --   --   HGB 8.5* 8.0* 7.9* 6.9* 7.0* 9.0*  HCT 27.2* 25.4* 25.7* 21.8* 21.8* 27.9*  MCV 81.7 81.9 83.7 81.6  --  80.2  PLT 493* 485* 509* 501*  --  463*   CBG: Recent Labs  Lab 03/27/24 1608 03/27/24 2029 03/27/24 2352 03/28/24 0445 03/28/24 0822  GLUCAP 88 106* 107* 108* 108*   Hgb A1c No results for input(s): HGBA1C in the last 72 hours. Lipid Profile No results for input(s): CHOL, HDL, LDLCALC, TRIG, CHOLHDL, LDLDIRECT in the last 72 hours. Thyroid  function studies No results for input(s): TSH, T4TOTAL, T3FREE, THYROIDAB in the last 72  hours.  Invalid input(s): FREET3 Urinalysis    Component Value Date/Time   COLORURINE YELLOW 03/23/2024 1103   APPEARANCEUR TURBID (A) 03/23/2024 1103   LABSPEC 1.012 03/23/2024 1103   PHURINE 6.0 03/23/2024 1103   GLUCOSEU NEGATIVE 03/23/2024 1103   HGBUR SMALL (A) 03/23/2024 1103   BILIRUBINUR NEGATIVE 03/23/2024 1103   KETONESUR NEGATIVE 03/23/2024 1103   PROTEINUR 100 (A) 03/23/2024 1103   UROBILINOGEN 1.0 07/11/2014 0914   NITRITE NEGATIVE 03/23/2024 1103   LEUKOCYTESUR LARGE (A) 03/23/2024 1103    FURTHER DISCHARGE INSTRUCTIONS:   Get Medicines reviewed and adjusted: Please take all your medications with you for your next visit with your Primary MD   Laboratory/radiological data: Please request your Primary MD to go over all hospital tests and procedure/radiological results at the follow up, please ask your Primary MD to get all Hospital records sent to his/her office.   In some cases, they will be blood work, cultures and biopsy results pending at the time of your discharge. Please request that your primary care M.D. goes through all the records of your hospital data and follows up on these results.   Also Note the following: If you experience worsening of your admission symptoms, develop shortness of breath, life threatening emergency, suicidal or homicidal thoughts you must seek medical attention immediately by calling 911 or calling your MD immediately  if symptoms less severe.   You must read complete instructions/literature along with all the possible adverse reactions/side effects for all the Medicines you take and that have been prescribed to you. Take any new Medicines after you have completely understood and accpet all the possible adverse reactions/side effects.    Do not drive when taking Pain medications or sleeping medications (Benzodaizepines)   Do not take more than prescribed Pain, Sleep and Anxiety  Medications. It is not advisable to combine  anxiety,sleep and pain medications without talking with your primary care practitioner   Special Instructions: If you have smoked or chewed Tobacco  in the last 2 yrs please stop smoking, stop any regular Alcohol  and or any Recreational drug use.   Wear Seat belts while driving.   Please note: You were cared for by a hospitalist during your hospital stay. Once you are discharged, your primary care physician will handle any further medical issues. Please note that NO REFILLS for any discharge medications will be authorized once you are discharged, as it is imperative that you return to your primary care physician (or establish a relationship with a primary care physician if you do not have one) for your post hospital discharge needs so that they can reassess your need for medications and monitor your lab values.  Time coordinating discharge: 35 minutes  SIGNED:  Nilda Fendt, MD, PhD 03/28/2024, 11:52 AM

## 2024-03-28 NOTE — Plan of Care (Signed)
  Problem: Fluid Volume: Goal: Hemodynamic stability will improve Outcome: Not Progressing   Problem: Clinical Measurements: Goal: Diagnostic test results will improve Outcome: Not Progressing Goal: Signs and symptoms of infection will decrease Outcome: Not Progressing   Problem: Respiratory: Goal: Ability to maintain adequate ventilation will improve Outcome: Not Progressing   Problem: Education: Goal: Knowledge of General Education information will improve Description: Including pain rating scale, medication(s)/side effects and non-pharmacologic comfort measures Outcome: Not Progressing   Problem: Health Behavior/Discharge Planning: Goal: Ability to manage health-related needs will improve Outcome: Not Progressing   Problem: Clinical Measurements: Goal: Ability to maintain clinical measurements within normal limits will improve Outcome: Not Progressing Goal: Will remain free from infection Outcome: Not Progressing Goal: Diagnostic test results will improve Outcome: Not Progressing Goal: Respiratory complications will improve Outcome: Not Progressing Goal: Cardiovascular complication will be avoided Outcome: Not Progressing   Problem: Activity: Goal: Risk for activity intolerance will decrease Outcome: Not Progressing   Problem: Nutrition: Goal: Adequate nutrition will be maintained Outcome: Not Progressing   Problem: Coping: Goal: Level of anxiety will decrease Outcome: Not Progressing   Problem: Elimination: Goal: Will not experience complications related to bowel motility Outcome: Not Progressing Goal: Will not experience complications related to urinary retention Outcome: Not Progressing   Problem: Pain Managment: Goal: General experience of comfort will improve and/or be controlled Outcome: Not Progressing   Problem: Safety: Goal: Ability to remain free from injury will improve Outcome: Not Progressing   Problem: Skin Integrity: Goal: Risk for  impaired skin integrity will decrease Outcome: Not Progressing

## 2024-03-28 NOTE — Progress Notes (Signed)
 Palliative Care Family Meeting  Met with patients 2 daughters and 5 sons to discuss goals of care along with Dr. Trixie, We explained the terminal trajectory of her current illness and options for care including comfort as primary focus of interventions. Each family member was given a chance to share their thoughts on her condition and preferences as well as the opportunity to ask questions. Collectively they have all decided on comfort care and relief of suffering as primary goal-they were in agreement to allow for a natural detah to occur with peace and dignity, Prior to admission patient was followed by [facility] at her home but her symptoms of fever, profuse diarrhea and rapidly progressive wound led them to have her transferred to hospital for treatment. Patient has had difficult to manage pain and complex wound care needs,  Recommendations:  DNR comfort care She would be a good candidate for transfer to [facility] for symptom management prior to discharge back home with hospice care, Family are agreeable with placing a referral for services, For pain witll schedule 20mg   SL Roxanol q4 instead of PRN, will order IV Fentanyl  PRN for severe pain and to be given as pre-medication prior to wound care. While some family members report patient would have never wanted a feeding tube will leave this in place as medication route and consider switching to supplemental bolus feeding and comfort feeding by mouth as long as she does not experience distress-if high residuals or complications will discontinue. Continue PO Vancomycin  for C. Diff infection-this is a comfort measure-stop IV antibiotics otherwise given drug resistant infection and non-healing wound with osteomyelitis. Palliative wound care, no aggressive debridement or multiple painful drg changes- use dankins to cleanse and apply flagyl  gel into the wound bed to control odor and lower bacterial burden.May also consider lidocaine  gell with  morphine  for topical pain control.  Her prognosis is <6 months, likely a couple of weeks at most given status of resistant infection and over all decline.  Care plan discussed with family, hospitalist, and all of patients children.  Almarie General, DO Palliative Medicine   Time:75 minutes

## 2024-03-29 DIAGNOSIS — R509 Fever, unspecified: Secondary | ICD-10-CM | POA: Diagnosis not present

## 2024-03-29 MED ORDER — LIDOCAINE HCL URETHRAL/MUCOSAL 2 % EX GEL
1.0000 | Freq: Every day | CUTANEOUS | Status: DC
  Administered 2024-03-29 – 2024-04-03 (×5): 1 via TOPICAL
  Filled 2024-03-29: qty 6
  Filled 2024-03-29: qty 5
  Filled 2024-03-29 (×6): qty 6

## 2024-03-29 NOTE — Progress Notes (Addendum)
 PROGRESS NOTE  [name] FMW:[id number] DOB: [birth year] DOA: 03/23/2024 PCP: Pcp, No   LOS: 6 days   Brief Narrative / Interim history: 89 year old female with HTN, HLD, CAD, CVA, chronic debility and essentially bedbound and wheelchair-bound, CKD 3A comes into the hospital with worsening lethargy.  [name] is an Charity fundraiser and primary caregiver.  She was admitted to the hospital with empiric diagnosis of sacral osteomyelitis and UTI.  In hospital day 1 she also has had profuse diarrhea requiring rectal tube placement.  C. difficile was positive  Subjective / 24h Interval events: No distress, son at bedside   Assesement and Plan: Principal problem Severe sepsis secondary to sacral osteomyelitis, UTI -she met sepsis criteria on admission with fever, hypotension, tachypnea as well as leukocytosis.  CT scan on admission with sacral wound overlying the distal sacrococcygeal spine with suspicion for underlying osteomyelitis.  She initially was placed on antibiotics. Palliative care was consulted given progressive decline, she is essentially bedbound, and an extremely high likelihood that her sacral wound will never heal but continue to contribute to worsening pain, suffering and loss of quality of life.  Goals of care discussions were held with the family on 7/18, now transitioned to comfort  Active problems C. difficile colitis -patient has had profuse diarrhea on 7/15 with 10+ liquid bowel movements requiring rectal tube placement.  C. difficile was positive but negative toxin.  Given clinical picture with numerous loose bowel movements, treat with p.o. vancomycin .  Complete a 10-day course for comfort purposes, as severe diarrhea can contribute to discomfort and soil the sacral wound ESBL and Enterococcus UTI  Acute kidney injury on chronic kidney disease stage IIIb  Suspect underlying cognitive issues Anemia-in the setting of acute illness, infection, chronic kidney disease.  There is  also dilutional component as she received fluids.  Hemoglobin dipped to 6.9 on 7/17, received a unit of packed red blood cells, improved appropriately.  No bleeding  History of CVA -continue PEG tube, is essentially bedbound. PAF Urinary retention Hyponatremia  Scheduled Meds:  sodium chloride    Intravenous Once   Chlorhexidine  Gluconate Cloth  6 each Topical Daily   feeding supplement (PROSource TF20)  60 mL Per Tube Daily   fiber supplement (BANATROL TF)  60 mL Per Tube BID   free water   200 mL Per Tube Q4H   Gerhardt's butt cream  1 Application Topical TID   morphine  CONCENTRATE  10 mg Oral Q6H   nutrition supplement (JUVEN)  1 packet Per Tube BID   Rivaroxaban   15 mg Per Tube Q supper   vancomycin   125 mg Per Tube QID   Continuous Infusions:  feeding supplement (JEVITY 1.5 CAL/FIBER) 1,000 mL (03/28/24 2308)   PRN Meds:.acetaminophen  **OR** acetaminophen , fentaNYL  (SUBLIMAZE ) injection  Current Outpatient Medications  Medication Instructions   acetaminophen  (TYLENOL ) 1,000 mg, Oral, Every 6 hours PRN   aspirin  81 mg, Oral, Daily PRN   doxycycline (VIBRAMYCIN) 100 mg, 2 times daily   ergocalciferol  (VITAMIN D2) 50,000 Units, Every 30 days   hydrALAZINE  (APRESOLINE ) 25 mg, Oral, 2 times daily   ipratropium-albuterol (DUONEB) 0.5-2.5 (3) MG/3ML SOLN 3 mLs, Inhalation, Every 4 hours PRN   LUMIGAN 0.01 % SOLN 1 drop, Both Eyes, Daily at bedtime   metoprolol  succinate (TOPROL -XL) 12.5 mg, Oral, Daily   morphine  CONCENTRATE 20 mg, Oral, Every 4 hours PRN   nitroGLYCERIN  (NITROSTAT ) 0.4 MG SL tablet PLACE 1 TABLET UNDER THE TONGUE EVERY 5 MINUTES AS NEEDED.   Rivaroxaban  (XARELTO ) 15  mg, Oral, Daily with supper   rosuvastatin  (CRESTOR ) 40 MG tablet TAKE 1 TABLET BY MOUTH EVERY DAY   Sodium Chloride  Flush (SALINE FLUSH IV) Intravenous, Every 4 hours   Soft Lens Products (SENSITIVE EYES SALINE) SOLN 1 drop, Does not apply, 2 times daily    Diet Orders (From admission, onward)      Start     Ordered   03/23/24 1347  Diet NPO time specified Except for: Sips with Meds  Diet effective now       Question:  Except for  Answer:  Sips with Meds   03/23/24 1350            DVT prophylaxis:  Rivaroxaban  (XARELTO ) tablet 15 mg   Lab Results  Component Value Date   PLT 463 (H) 03/27/2024      Code Status: Do not attempt resuscitation (DNR) - Comfort care  Family Communication: son at bedside   Status is: Inpatient Remains inpatient appropriate because: severity of illness  Level of care: Progressive  Consultants:  Palliative care  Objective: Vitals:   03/28/24 0824 03/28/24 1205 03/28/24 1928 03/29/24 0753  BP:  (!) 112/58 135/60 133/61  Pulse:  79 78 91  Resp:  (!) 22 19 19   Temp: 97.9 F (36.6 C) 98.6 F (37 C) 98.3 F (36.8 C) 100.2 F (37.9 C)  TempSrc: Axillary Axillary Axillary Axillary  SpO2:  100% 99% 98%  Weight:      Height:        Intake/Output Summary (Last 24 hours) at 03/29/2024 1304 Last data filed at 03/29/2024 1300 Gross per 24 hour  Intake 2860 ml  Output 575 ml  Net 2285 ml   Wt Readings from Last 3 Encounters:  03/28/24 81.9 kg  08/21/23 89.8 kg  06/21/23 90.5 kg    Examination:  Constitutional: NAD  Data Reviewed: I have independently reviewed following labs and imaging studies   CBC Recent Labs  Lab 03/23/24 0748 03/24/24 0322 03/25/24 0242 03/26/24 0250 03/26/24 1038 03/27/24 0813  WBC 12.4* 19.4* 14.4* 11.0*  --  9.2  HGB 8.5* 8.0* 7.9* 6.9* 7.0* 9.0*  HCT 27.2* 25.4* 25.7* 21.8* 21.8* 27.9*  PLT 493* 485* 509* 501*  --  463*  MCV 81.7 81.9 83.7 81.6  --  80.2  MCH 25.5* 25.8* 25.7* 25.8*  --  25.9*  MCHC 31.3 31.5 30.7 31.7  --  32.3  RDW 16.6* 16.6* 17.0* 17.3*  --  17.8*  LYMPHSABS 2.2  --  1.6  --   --   --   MONOABS 0.9  --  0.9  --   --   --   EOSABS 0.1  --  0.3  --   --   --   BASOSABS 0.0  --  0.0  --   --   --     Recent Labs  Lab 03/23/24 0748 03/23/24 0812 03/23/24 0843  03/23/24 1655 03/24/24 0322 03/25/24 0242 03/26/24 0250 03/27/24 0220 03/27/24 0813 03/28/24 0241  NA 132*  --   --   --  135 140 136  --  134*  --   K 5.1  --   --   --  4.7 4.1 4.5  --  4.7  --   CL 101  --   --   --  107 110 109  --  104  --   CO2 23  --   --   --  22 21* 21*  --  22  --  GLUCOSE 133*  --   --   --  131* 122* 101*  --  104*  --   BUN 57*  --   --   --  49* 45* 50*  --  66*  --   CREATININE 2.55*  --   --   --  2.20* 1.90* 1.67*  --  1.58*  --   CALCIUM  11.5*  --   --  10.8* 10.6*  10.6* 9.8 9.1  --  9.1  --   AST 33  --   --   --  22 22 21   --  23  --   ALT 16  --   --   --  12 13 13   --  15  --   ALKPHOS 89  --   --   --  71 79 61  --  58  --   BILITOT 0.7  --   --   --  0.6 0.4 0.5  --  0.6  --   ALBUMIN  1.6*  --   --   --  1.8* 1.7* <1.5*  --  <1.5*  --   MG  --   --   --  2.1  --  2.0 1.9 1.9  --  1.8  CRP  --   --   --   --   --  11.9*  --   --   --   --   LATICACIDVEN  --  1.5  --   --   --   --   --   --   --   --   INR  --   --  1.3*  --  1.3*  --   --   --   --   --     ------------------------------------------------------------------------------------------------------------------ No results for input(s): CHOL, HDL, LDLCALC, TRIG, CHOLHDL, LDLDIRECT in the last 72 hours.  Lab Results  Component Value Date   HGBA1C 6.0 (H) 08/20/2022   ------------------------------------------------------------------------------------------------------------------ No results for input(s): TSH, T4TOTAL, T3FREE, THYROIDAB in the last 72 hours.  Invalid input(s): FREET3  Cardiac Enzymes No results for input(s): CKMB, TROPONINI, MYOGLOBIN in the last 168 hours.  Invalid input(s): CK ------------------------------------------------------------------------------------------------------------------    Component Value Date/Time   BNP 43.0 06/21/2023 1037    CBG: Recent Labs  Lab 03/27/24 2352 03/28/24 0445 03/28/24 0822  03/28/24 1200 03/28/24 1640  GLUCAP 107* 108* 108* 95 104*    Recent Results (from the past 240 hours)  Resp panel by RT-PCR (RSV, Flu A&B, Covid) In/Out Cath Urine     Status: None   Collection Time: 03/23/24  7:56 AM   Specimen: In/Out Cath Urine; Nasal Swab  Result Value Ref Range Status   SARS Coronavirus 2 by RT PCR NEGATIVE NEGATIVE Final   Influenza A by PCR NEGATIVE NEGATIVE Final   Influenza B by PCR NEGATIVE NEGATIVE Final    Comment: (NOTE) The Xpert Xpress SARS-CoV-2/FLU/RSV plus assay is intended as an aid in the diagnosis of influenza from Nasopharyngeal swab specimens and should not be used as a sole basis for treatment. Nasal washings and aspirates are unacceptable for Xpert Xpress SARS-CoV-2/FLU/RSV testing.  Fact Sheet for Patients: BloggerCourse.com  Fact Sheet for Healthcare Providers: SeriousBroker.it  This test is not yet approved or cleared by the [place]  FDA and has been authorized for detection and/or diagnosis of SARS-CoV-2 by FDA under an Emergency Use Authorization (EUA). This EUA will remain in effect (meaning this test can be used)  for the duration of the COVID-19 declaration under Section 564(b)(1) of the Act, 21 U.S.C. section 360bbb-3(b)(1), unless the authorization is terminated or revoked.     Resp Syncytial Virus by PCR NEGATIVE NEGATIVE Final    Comment: (NOTE) Fact Sheet for Patients: BloggerCourse.com  Fact Sheet for Healthcare Providers: SeriousBroker.it  This test is not yet approved or cleared by the [place]  FDA and has been authorized for detection and/or diagnosis of SARS-CoV-2 by FDA under an Emergency Use Authorization (EUA). This EUA will remain in effect (meaning this test can be used) for the duration of the COVID-19 declaration under Section 564(b)(1) of the Act, 21 U.S.C. section 360bbb-3(b)(1), unless the  authorization is terminated or revoked.  Performed at [facility], [address] [place], [place] [address]   Culture, blood (Routine x 2)     Status: None   Collection Time: 03/23/24  8:02 AM   Specimen: BLOOD RIGHT ARM  Result Value Ref Range Status   Specimen Description BLOOD RIGHT ARM  Final   Special Requests   Final    BOTTLES DRAWN AEROBIC AND ANAEROBIC Blood Culture results may not be optimal due to an inadequate volume of blood received in culture bottles   Culture   Final    NO GROWTH 5 DAYS Performed at [facility], [address], [place], [place] [address]    Report Status 03/28/2024 FINAL  Final  Urine Culture     Status: Abnormal   Collection Time: 03/23/24 11:03 AM   Specimen: Urine, Random  Result Value Ref Range Status   Specimen Description URINE, RANDOM  Final   Special Requests   Final    NONE Reflexed from [id number] Performed at [facility], [address], [place], [place] [address]    Culture (A)  Final    >=100,000 COLONIES/mL ESCHERICHIA COLI Confirmed Extended Spectrum Beta-Lactamase Producer (ESBL).  In bloodstream infections from ESBL organisms, carbapenems are preferred over piperacillin/tazobactam. They are shown to have a lower risk of mortality. 40,000 COLONIES/mL ENTEROCOCCUS FAECALIS    Report Status 03/26/2024 FINAL  Final   Organism ID, Bacteria ESCHERICHIA COLI (A)  Final   Organism ID, Bacteria ENTEROCOCCUS FAECALIS (A)  Final      Susceptibility   Escherichia coli - MIC*    AMPICILLIN >=32 RESISTANT Resistant     CEFAZOLIN >=64 RESISTANT Resistant     CEFEPIME >=32 RESISTANT Resistant     CEFTRIAXONE  >=64 RESISTANT Resistant     CIPROFLOXACIN >=4 RESISTANT Resistant     GENTAMICIN <=1 SENSITIVE Sensitive     IMIPENEM <=0.25 SENSITIVE Sensitive     NITROFURANTOIN <=16 SENSITIVE Sensitive     TRIMETH/SULFA <=20 SENSITIVE Sensitive     AMPICILLIN/SULBACTAM >=32 RESISTANT Resistant     PIP/TAZO 64 INTERMEDIATE  Intermediate ug/mL    * >=100,000 COLONIES/mL ESCHERICHIA COLI   Enterococcus faecalis - MIC*    AMPICILLIN <=2 SENSITIVE Sensitive     NITROFURANTOIN <=16 SENSITIVE Sensitive     VANCOMYCIN  1 SENSITIVE Sensitive     * 40,000 COLONIES/mL ENTEROCOCCUS FAECALIS  Culture, blood (Routine x 2)     Status: None   Collection Time: 03/23/24  4:55 PM   Specimen: BLOOD LEFT ARM  Result Value Ref Range Status   Specimen Description BLOOD LEFT ARM  Final   Special Requests   Final    BOTTLES DRAWN AEROBIC ONLY Blood Culture results may not be optimal due to an inadequate volume of blood received  in culture bottles   Culture   Final    NO GROWTH 5 DAYS Performed at [facility], [address] [place], [place] [address]    Report Status 03/28/2024 FINAL  Final  C Difficile Quick Screen w PCR reflex     Status: Abnormal   Collection Time: 03/25/24 12:14 AM   Specimen: STOOL  Result Value Ref Range Status   C Diff antigen POSITIVE (A) NEGATIVE Final   C Diff toxin NEGATIVE NEGATIVE Final   C Diff interpretation Results are indeterminate. See PCR results.  Final    Comment: Performed at [facility], [address] [place], [place] [address]  C. Diff by PCR, Reflexed     Status: Abnormal   Collection Time: 03/25/24 12:14 AM  Result Value Ref Range Status   Toxigenic C. Difficile by PCR POSITIVE (A) NEGATIVE Final    Comment: Positive for toxigenic C. difficile with little to no toxin production. Only treat if clinical presentation suggests symptomatic illness. Performed at [facility], [address] [place], [place] [address]      Radiology Studies: No results found.   Nilda Fendt, MD, PhD [facility]  Between 7 am - 7 pm I am available, please contact me via Amion (for emergencies) or Securechat (non urgent messages)  Between 7 pm - 7 am I am not available, please contact night coverage MD/APP via Amion

## 2024-03-30 DIAGNOSIS — R509 Fever, unspecified: Secondary | ICD-10-CM | POA: Diagnosis not present

## 2024-03-30 LAB — BPAM RBC
Blood Product Expiration Date: 202507232359
Blood Product Expiration Date: 202507272359
ISSUE DATE / TIME: 202507172240
Unit Type and Rh: 9500
Unit Type and Rh: 9500

## 2024-03-30 LAB — TYPE AND SCREEN
ABO/RH(D): B NEG
Antibody Screen: POSITIVE
DAT, IgG: NEGATIVE
Unit division: 0
Unit division: 0

## 2024-03-30 MED ORDER — LATANOPROST 0.005 % OP SOLN
1.0000 [drp] | Freq: Every day | OPHTHALMIC | Status: DC
  Administered 2024-03-30 – 2024-04-02 (×4): 1 [drp] via OPHTHALMIC
  Filled 2024-03-30: qty 2.5

## 2024-03-30 NOTE — Progress Notes (Signed)
 SLP Cancellation Note  Patient Details Name: [name] MRN: [id number] DOB: 1934/05/19   Cancelled treatment:        Orders for swallowing assessment received and appreciated.  Spoke with RN.  Pt is too lethargic to participate in PO trials at this time.  Pt is transitioning to comfort care.  It would be reasonable to allow for comfort feedings. Pt is PEG tube dependent at baseline, but has been asking for water . Per FEES completed with [facility] healthcare in Nov 2024, recommendations for pt to remain NPO with sips of water  by straw after oral hygiene.  Would follow these recommendations for allowing water  for comfort.  Pt would likely not benefit from formal swallow assessment at this time given goals of care.  SLP will sign off.    Anette FORBES Grippe, MA, CCC-SLP Acute Rehabilitation Services Office: [phone] 03/30/2024, 9:53 AM

## 2024-03-30 NOTE — Progress Notes (Signed)
 Physical Therapy Wound Care Discharge Patient Details Name: [name] MRN: [id number] DOB: [birth year] Today's Date: 03/30/2024 Time:  -     Patient discharged from PT services secondary to Pt has transitioned to comfort care.  Please see latest wound therapy note for toward goals.      [name] PT, DPT Acute Rehabilitation Services Please use secure chat or  Call Office [phone]      [name] 03/30/2024, 2:06 PM

## 2024-03-30 NOTE — TOC Progression Note (Addendum)
 Transition of Care [facility]) - Progression Note    Patient Details  Name: [name] MRN: [id number] Date of Birth: [birth year]  [facility] CM/SW Contact  Waddell Barnie Rama, RN Phone Number: 03/30/2024, 2:17 PM  Clinical Narrative:    NCM received notification from CSW that patient will be going home with home hospice.  NCM spoke with daughter ,  [name] at the bedside.   She states yes patient will be going home with [facility].  NCM informed Melissa with [facility] to contact daughter.  The DME with [facility] is at [employer], the new DME from [facility] will be going to Anna's house in [place], when ever the DME has been delivered, patient should be good to go by ambulance.         Expected Discharge Plan and Services                                               Social Determinants of Health (SDOH) Interventions SDOH Screenings   Food Insecurity: No Food Insecurity (03/24/2024)  Housing: Low Risk  (03/24/2024)  Transportation Needs: No Transportation Needs (03/24/2024)  Utilities: Not At Risk (03/24/2024)  Depression (PHQ2-9): Low Risk  (05/03/2023)  Financial Resource Strain: Low Risk  (07/17/2023)   Received from [facility]  Physical Activity: Inactive (07/17/2023)   Received from [facility]  Social Connections: Moderately Integrated (03/24/2024)  Stress: No Stress Concern Present (07/17/2023)   [facility] from [facility]  Tobacco Use: Low Risk  (03/23/2024)  Health Literacy: High Risk (07/17/2023)   Received from [facility]    Readmission Risk Interventions    03/24/2024    2:26 PM 06/12/2023   10:22 AM 03/25/2023    3:24 PM  Readmission Risk Prevention Plan  Post Dischage Appt   Complete  Medication Screening   Complete  Transportation Screening Complete Complete Complete  PCP or Specialist Appt within 5-7 Days Complete Complete   Home Care Screening Complete Complete   Medication Review (RN CM)  Complete Referral to Pharmacy

## 2024-03-30 NOTE — Plan of Care (Signed)

## 2024-03-30 NOTE — Progress Notes (Signed)
 Nutrition Brief Note  Chart reviewed. Pt now transitioning to comfort care.  No further nutrition interventions planned at this time.  Please re-consult as needed.   Noya Santarelli Daml-Budig, RDN, LDN Registered Dietitian Nutritionist RD Inpatient Contact Info in [place]

## 2024-03-30 NOTE — Plan of Care (Signed)
  Problem: Respiratory: Goal: Ability to maintain adequate ventilation will improve Outcome: Progressing   Problem: Fluid Volume: Goal: Hemodynamic stability will improve Outcome: Not Progressing   Problem: Education: Goal: Knowledge of General Education information will improve Description: Including pain rating scale, medication(s)/side effects and non-pharmacologic comfort measures Outcome: Not Progressing   Problem: Clinical Measurements: Goal: Will remain free from infection Outcome: Not Progressing   Problem: Activity: Goal: Risk for activity intolerance will decrease Outcome: Not Progressing   Problem: Pain Managment: Goal: General experience of comfort will improve and/or be controlled Outcome: Not Progressing   Problem: Skin Integrity: Goal: Risk for impaired skin integrity will decrease Outcome: Not Progressing

## 2024-03-30 NOTE — TOC Progression Note (Signed)
 Transition of Care [facility]) - Progression Note    Patient Details  Name: [name] MRN: [id number] Date of Birth: 07-23-1934  Transition of Care [facility]) CM/SW Contact  Luise JAYSON Pan, [place] Phone Number: 03/30/2024, 10:51 AM  Clinical Narrative:  Civil engineer, contracting following.   TOC will continue to follow.    Social Determinants of Health (SDOH) Interventions SDOH Screenings   Food Insecurity: No Food Insecurity (03/24/2024)  Housing: Low Risk  (03/24/2024)  Transportation Needs: No Transportation Needs (03/24/2024)  Utilities: Not At Risk (03/24/2024)  Depression (PHQ2-9): Low Risk  (05/03/2023)  Financial Resource Strain: Low Risk  (07/17/2023)   Received from [facility]  Physical Activity: Inactive (07/17/2023)   Received from [facility]  Social Connections: Moderately Integrated (03/24/2024)  Stress: No Stress Concern Present (07/17/2023)   Received from [facility]  Tobacco Use: Low Risk  (03/23/2024)  Health Literacy: High Risk (07/17/2023)   Received from [facility]    Readmission Risk Interventions    03/24/2024    2:26 PM 06/12/2023   10:22 AM 03/25/2023    3:24 PM  Readmission Risk Prevention Plan  Post Dischage Appt   Complete  Medication Screening   Complete  Transportation Screening Complete Complete Complete  PCP or Specialist Appt within 5-7 Days Complete Complete   Home Care Screening Complete Complete   Medication Review (RN CM) Complete Referral to Pharmacy

## 2024-03-30 NOTE — Progress Notes (Signed)
 PROGRESS NOTE  [name] FMW:[id number] DOB: 1933/12/10 DOA: 03/23/2024 PCP: Pcp, No   LOS: 7 days   Brief Narrative / Interim history: 89 year old female with HTN, HLD, CAD, CVA, chronic debility and essentially bedbound and wheelchair-bound, CKD 3A comes into the hospital with worsening lethargy.  [name] is an Charity fundraiser and primary caregiver.  She was admitted to the hospital with empiric diagnosis of sacral osteomyelitis and UTI.  In hospital day 1 she also has had profuse diarrhea requiring rectal tube placement.  C. difficile was positive  Subjective / 24h Interval events: She is no distress. Daughter is at bedside   Assesement and Plan: Principal problem Severe sepsis secondary to sacral osteomyelitis, UTI -she met sepsis criteria on admission with fever, hypotension, tachypnea as well as leukocytosis.  CT scan on admission with sacral wound overlying the distal sacrococcygeal spine with suspicion for underlying osteomyelitis.  She initially was placed on antibiotics. Palliative care was consulted given progressive decline, she is essentially bedbound, and an extremely high likelihood that her sacral wound will never heal but continue to contribute to worsening pain, suffering and loss of quality of life.  Goals of care discussions were held with the family on 7/18, now transitioned to comfort -home with hospice tomorrow  Active problems C. difficile colitis -patient has had profuse diarrhea on 7/15 with 10+ liquid bowel movements requiring rectal tube placement.  C. difficile was positive but negative toxin.  Given clinical picture with numerous loose bowel movements, treat with p.o. vancomycin .  Complete a 10-day course for comfort purposes, as severe diarrhea can contribute to discomfort and soil the sacral wound ESBL and Enterococcus UTI  Acute kidney injury on chronic kidney disease stage IIIb  Suspect underlying cognitive issues Anemia-in the setting of acute illness,  infection, chronic kidney disease.  There is also dilutional component as she received fluids.  Hemoglobin dipped to 6.9 on 7/17, received a unit of packed red blood cells, improved appropriately.  No bleeding  History of CVA -continue PEG tube, is essentially bedbound. PAF Urinary retention Hyponatremia  Scheduled Meds:  Chlorhexidine  Gluconate Cloth  6 each Topical Daily   feeding supplement (PROSource TF20)  60 mL Per Tube Daily   fiber supplement (BANATROL TF)  60 mL Per Tube BID   free water   200 mL Per Tube Q4H   Gerhardt's butt cream  1 Application Topical TID   latanoprost   1 drop Both Eyes QHS   lidocaine   1 Application Topical Daily   morphine  CONCENTRATE  10 mg Oral Q6H   nutrition supplement (JUVEN)  1 packet Per Tube BID   Rivaroxaban   15 mg Per Tube Q supper   vancomycin   125 mg Per Tube QID   Continuous Infusions:  feeding supplement (JEVITY 1.5 CAL/FIBER) 1,000 mL (03/29/24 1741)   PRN Meds:.acetaminophen  **OR** acetaminophen , fentaNYL  (SUBLIMAZE ) injection  Current Outpatient Medications  Medication Instructions   acetaminophen  (TYLENOL ) 1,000 mg, Oral, Every 6 hours PRN   aspirin  81 mg, Oral, Daily PRN   doxycycline (VIBRAMYCIN) 100 mg, 2 times daily   ergocalciferol  (VITAMIN D2) 50,000 Units, Every 30 days   hydrALAZINE  (APRESOLINE ) 25 mg, Oral, 2 times daily   ipratropium-albuterol (DUONEB) 0.5-2.5 (3) MG/3ML SOLN 3 mLs, Inhalation, Every 4 hours PRN   LUMIGAN 0.01 % SOLN 1 drop, Both Eyes, Daily at bedtime   metoprolol  succinate (TOPROL -XL) 12.5 mg, Oral, Daily   morphine  CONCENTRATE 20 mg, Oral, Every 4 hours PRN   nitroGLYCERIN  (NITROSTAT ) 0.4 MG SL tablet  PLACE 1 TABLET UNDER THE TONGUE EVERY 5 MINUTES AS NEEDED.   Rivaroxaban  (XARELTO ) 15 mg, Oral, Daily with supper   rosuvastatin  (CRESTOR ) 40 MG tablet TAKE 1 TABLET BY MOUTH EVERY DAY   Sodium Chloride  Flush (SALINE FLUSH IV) Intravenous, Every 4 hours   Soft Lens Products (SENSITIVE EYES SALINE) SOLN  1 drop, Does not apply, 2 times daily    Diet Orders (From admission, onward)     Start     Ordered   03/30/24 1001  DIET DYS 3 Room service appropriate? Yes; Fluid consistency: Thin  Diet effective now       Question Answer Comment  Room service appropriate? Yes   Fluid consistency: Thin      03/30/24 1000            DVT prophylaxis:  Rivaroxaban  (XARELTO ) tablet 15 mg   Lab Results  Component Value Date   PLT 463 (H) 03/27/2024      Code Status: Do not attempt resuscitation (DNR) - Comfort care  Family Communication: son at bedside   Status is: Inpatient Remains inpatient appropriate because: severity of illness  Level of care: Progressive  Consultants:  Palliative care  Objective: Vitals:   03/30/24 0754 03/30/24 1100 03/30/24 1142 03/30/24 1548  BP: (!) 106/43  (!) 108/45 (!) 104/50  Pulse: 82  80 79  Resp:      Temp: (!) 101.3 F (38.5 C) 99 F (37.2 C) 99 F (37.2 C) 98.3 F (36.8 C)  TempSrc: Axillary Axillary Axillary Axillary  SpO2: 92%  95% 95%  Weight:      Height:        Intake/Output Summary (Last 24 hours) at 03/30/2024 1640 Last data filed at 03/30/2024 1132 Gross per 24 hour  Intake 3450 ml  Output 2050 ml  Net 1400 ml   Wt Readings from Last 3 Encounters:  03/28/24 81.9 kg  08/21/23 89.8 kg  06/21/23 90.5 kg    Examination:  Constitutional: NAD  Data Reviewed: I have independently reviewed following labs and imaging studies   CBC Recent Labs  Lab 03/24/24 0322 03/25/24 0242 03/26/24 0250 03/26/24 1038 03/27/24 0813  WBC 19.4* 14.4* 11.0*  --  9.2  HGB 8.0* 7.9* 6.9* 7.0* 9.0*  HCT 25.4* 25.7* 21.8* 21.8* 27.9*  PLT 485* 509* 501*  --  463*  MCV 81.9 83.7 81.6  --  80.2  MCH 25.8* 25.7* 25.8*  --  25.9*  MCHC 31.5 30.7 31.7  --  32.3  RDW 16.6* 17.0* 17.3*  --  17.8*  LYMPHSABS  --  1.6  --   --   --   MONOABS  --  0.9  --   --   --   EOSABS  --  0.3  --   --   --   BASOSABS  --  0.0  --   --   --      Recent Labs  Lab 03/23/24 1655 03/24/24 0322 03/25/24 0242 03/26/24 0250 03/27/24 0220 03/27/24 0813 03/28/24 0241  NA  --  135 140 136  --  134*  --   K  --  4.7 4.1 4.5  --  4.7  --   CL  --  107 110 109  --  104  --   CO2  --  22 21* 21*  --  22  --   GLUCOSE  --  131* 122* 101*  --  104*  --   BUN  --  49* 45* 50*  --  66*  --   CREATININE  --  2.20* 1.90* 1.67*  --  1.58*  --   CALCIUM  10.8* 10.6*  10.6* 9.8 9.1  --  9.1  --   AST  --  22 22 21   --  23  --   ALT  --  12 13 13   --  15  --   ALKPHOS  --  71 79 61  --  58  --   BILITOT  --  0.6 0.4 0.5  --  0.6  --   ALBUMIN   --  1.8* 1.7* <1.5*  --  <1.5*  --   MG 2.1  --  2.0 1.9 1.9  --  1.8  CRP  --   --  11.9*  --   --   --   --   INR  --  1.3*  --   --   --   --   --     ------------------------------------------------------------------------------------------------------------------ No results for input(s): CHOL, HDL, LDLCALC, TRIG, CHOLHDL, LDLDIRECT in the last 72 hours.  Lab Results  Component Value Date   HGBA1C 6.0 (H) 08/20/2022   ------------------------------------------------------------------------------------------------------------------ No results for input(s): TSH, T4TOTAL, T3FREE, THYROIDAB in the last 72 hours.  Invalid input(s): FREET3  Cardiac Enzymes No results for input(s): CKMB, TROPONINI, MYOGLOBIN in the last 168 hours.  Invalid input(s): CK ------------------------------------------------------------------------------------------------------------------    Component Value Date/Time   BNP 43.0 06/21/2023 1037    CBG: Recent Labs  Lab 03/27/24 2352 03/28/24 0445 03/28/24 0822 03/28/24 1200 03/28/24 1640  GLUCAP 107* 108* 108* 95 104*    Recent Results (from the past 240 hours)  Resp panel by RT-PCR (RSV, Flu A&B, Covid) In/Out Cath Urine     Status: None   Collection Time: 03/23/24  7:56 AM   Specimen: In/Out Cath Urine; Nasal Swab   Result Value Ref Range Status   SARS Coronavirus 2 by RT PCR NEGATIVE NEGATIVE Final   Influenza A by PCR NEGATIVE NEGATIVE Final   Influenza B by PCR NEGATIVE NEGATIVE Final    Comment: (NOTE) The Xpert Xpress SARS-CoV-2/FLU/RSV plus assay is intended as an aid in the diagnosis of influenza from Nasopharyngeal swab specimens and should not be used as a sole basis for treatment. Nasal washings and aspirates are unacceptable for Xpert Xpress SARS-CoV-2/FLU/RSV testing.  Fact Sheet for Patients: BloggerCourse.com  Fact Sheet for Healthcare Providers: SeriousBroker.it  This test is not yet approved or cleared by the [place]  FDA and has been authorized for detection and/or diagnosis of SARS-CoV-2 by FDA under an Emergency Use Authorization (EUA). This EUA will remain in effect (meaning this test can be used) for the duration of the COVID-19 declaration under Section 564(b)(1) of the Act, 21 U.S.C. section 360bbb-3(b)(1), unless the authorization is terminated or revoked.     Resp Syncytial Virus by PCR NEGATIVE NEGATIVE Final    Comment: (NOTE) Fact Sheet for Patients: BloggerCourse.com  Fact Sheet for Healthcare Providers: SeriousBroker.it  This test is not yet approved or cleared by the [place]  FDA and has been authorized for detection and/or diagnosis of SARS-CoV-2 by FDA under an Emergency Use Authorization (EUA). This EUA will remain in effect (meaning this test can be used) for the duration of the COVID-19 declaration under Section 564(b)(1) of the Act, 21 U.S.C. section 360bbb-3(b)(1), unless the authorization is terminated or revoked.  Performed at [facility], [address] [place], [place] [address]  Culture, blood (Routine x 2)     Status: None   Collection Time: 03/23/24  8:02 AM   Specimen: BLOOD RIGHT ARM  Result Value Ref Range Status    Specimen Description BLOOD RIGHT ARM  Final   Special Requests   Final    BOTTLES DRAWN AEROBIC AND ANAEROBIC Blood Culture results may not be optimal due to an inadequate volume of blood received in culture bottles   Culture   Final    NO GROWTH 5 DAYS Performed at [facility], [address] [place], [place] [address]    Report Status 03/28/2024 FINAL  Final  Urine Culture     Status: Abnormal   Collection Time: 03/23/24 11:03 AM   Specimen: Urine, Random  Result Value Ref Range Status   Specimen Description URINE, RANDOM  Final   Special Requests   Final    NONE Reflexed from [id number] Performed at [facility], [address] [place], [place] [address]    Culture (A)  Final    >=100,000 COLONIES/mL ESCHERICHIA COLI Confirmed Extended Spectrum Beta-Lactamase Producer (ESBL).  In bloodstream infections from ESBL organisms, carbapenems are preferred over piperacillin/tazobactam. They are shown to have a lower risk of mortality. 40,000 COLONIES/mL ENTEROCOCCUS FAECALIS    Report Status 03/26/2024 FINAL  Final   Organism ID, Bacteria ESCHERICHIA COLI (A)  Final   Organism ID, Bacteria ENTEROCOCCUS FAECALIS (A)  Final      Susceptibility   Escherichia coli - MIC*    AMPICILLIN >=32 RESISTANT Resistant     CEFAZOLIN >=64 RESISTANT Resistant     CEFEPIME >=32 RESISTANT Resistant     CEFTRIAXONE  >=64 RESISTANT Resistant     CIPROFLOXACIN >=4 RESISTANT Resistant     GENTAMICIN <=1 SENSITIVE Sensitive     IMIPENEM <=0.25 SENSITIVE Sensitive     NITROFURANTOIN <=16 SENSITIVE Sensitive     TRIMETH/SULFA <=20 SENSITIVE Sensitive     AMPICILLIN/SULBACTAM >=32 RESISTANT Resistant     PIP/TAZO 64 INTERMEDIATE Intermediate ug/mL    * >=100,000 COLONIES/mL ESCHERICHIA COLI   Enterococcus faecalis - MIC*    AMPICILLIN <=2 SENSITIVE Sensitive     NITROFURANTOIN <=16 SENSITIVE Sensitive     VANCOMYCIN  1 SENSITIVE Sensitive     * 40,000 COLONIES/mL ENTEROCOCCUS FAECALIS  Culture,  blood (Routine x 2)     Status: None   Collection Time: 03/23/24  4:55 PM   Specimen: BLOOD LEFT ARM  Result Value Ref Range Status   Specimen Description BLOOD LEFT ARM  Final   Special Requests   Final    BOTTLES DRAWN AEROBIC ONLY Blood Culture results may not be optimal due to an inadequate volume of blood received in culture bottles   Culture   Final    NO GROWTH 5 DAYS Performed at [facility], [address] [place], [place] [address]    Report Status 03/28/2024 FINAL  Final  C Difficile Quick Screen w PCR reflex     Status: Abnormal   Collection Time: 03/25/24 12:14 AM   Specimen: STOOL  Result Value Ref Range Status   C Diff antigen POSITIVE (A) NEGATIVE Final   C Diff toxin NEGATIVE NEGATIVE Final   C Diff interpretation Results are indeterminate. See PCR results.  Final    Comment: Performed at [facility], [address] [place], [place] [address]  C. Diff by PCR, Reflexed     Status: Abnormal   Collection Time: 03/25/24 12:14 AM  Result  Value Ref Range Status   Toxigenic C. Difficile by PCR POSITIVE (A) NEGATIVE Final    Comment: Positive for toxigenic C. difficile with little to no toxin production. Only treat if clinical presentation suggests symptomatic illness. Performed at [facility], [address] [place], [place] [address]      Radiology Studies: No results found.   Nilda Fendt, MD, PhD [facility]  Between 7 am - 7 pm I am available, please contact me via Amion (for emergencies) or Securechat (non urgent messages)  Between 7 pm - 7 am I am not available, please contact night coverage MD/APP via Amion

## 2024-03-30 NOTE — Progress Notes (Signed)
 This chaplain is present with Pt. and Pt. daughter-Sandra for F/U spiritual care. The chaplain reviewed the chart notes and received an update from RN-Keysha before the visit.   The chaplain understands through reflective listening, [name] and family are accepting and moving forward with [facility] at home for the Pt. The chaplain affirmed the understanding of the Pt. goal to return home-[place] and surround herself with friends and family.  [name] and the RN began patient care and the chaplain left with a blessing.  Chaplain Leeroy Hummer [phone]

## 2024-03-31 DIAGNOSIS — R509 Fever, unspecified: Secondary | ICD-10-CM | POA: Diagnosis not present

## 2024-03-31 NOTE — Plan of Care (Signed)
  Problem: Respiratory: Goal: Ability to maintain adequate ventilation will improve Outcome: Progressing   Problem: Clinical Measurements: Goal: Respiratory complications will improve Outcome: Progressing Goal: Cardiovascular complication will be avoided Outcome: Progressing   Problem: Clinical Measurements: Goal: Will remain free from infection Outcome: Not Progressing   Problem: Activity: Goal: Risk for activity intolerance will decrease Outcome: Not Progressing

## 2024-03-31 NOTE — Progress Notes (Signed)
 [name] [phone] - [facility] Liaison Note  Following from [facility] referral and plan to discharge to daughter's home.   Discussed plan with both Therisa by phone and [name] at bedside. Plan to discharge Thursday or Friday due to work being done at [employer] home and necessary clean up.   Updated with DME requested and they will contact Therisa to schedule delivery.   Inpatient team updated with this information.   Please send signed and completed DNR home with patient/family. Please provide prescriptions at discharge as needed to ensure ongoing symptom management.   [facility] information and contact numbers given to daughters. Above information shared with Transitions of Care Manager. Please call with any questions or concerns.   Thank you for the opportunity to participate in this patient's care.   Elouise Husband BSN, Charity fundraiser, O[facility] [employer] [phone]

## 2024-03-31 NOTE — Progress Notes (Signed)
 PROGRESS NOTE  [name] FMW:[id number] DOB: 1934-01-11 DOA: 03/23/2024 PCP: Pcp, No   LOS: 8 days   Brief Narrative / Interim history: 89 year old female with HTN, HLD, CAD, CVA, chronic debility and essentially bedbound and wheelchair-bound, CKD 3A comes into the hospital with worsening lethargy.  [name] is an Char[employer] fundraiser and primary caregiver.  She was admitted to the hospital with empiric diagnosis of sacral osteomyelitis and UTI.  In hospital day 1 she also has had profuse diarrhea requiring rectal tube placement.  C. difficile was positive  Subjective / 24h Interval events: Patient is in no apparent distress, however daughter tells me she has been having increased pain.  She is febrile this morning.  Assesement and Plan: Principal problem Severe sepsis secondary to sacral osteomyelitis, UTI -she met sepsis criteria on admission with fever, hypotension, tachypnea as well as leukocytosis.  CT scan on admission with sacral wound overlying the distal sacrococcygeal spine with suspicion for underlying osteomyelitis.  She initially was placed on antibiotics. Palliative care was consulted given progressive decline, she is essentially bedbound, and an extremely high likelihood that her sacral wound will never heal but continue to contribute to worsening pain, suffering and loss of quality of life.  Goals of care discussions were held with the family on 7/18, now transitioned to comfort, discontinue all unnecessary antibiotics except for p.o. vancomycin  for her C. difficile - Supposed to go home with hospice, however there is worked on in the primary residence and family is unable to take her until Thursday or Friday this week.  Continue comfort measures, pain control, Tylenol  for fever without escalation of antibiotics  Active problems C. difficile colitis -patient has had profuse diarrhea on 7/15 with 10+ liquid bowel movements requiring rectal tube placement.  C. difficile was positive but  negative toxin.  Given clinical picture with numerous loose bowel movements, treat with p.o. vancomycin .  Complete a 10-day course for comfort purposes, as severe diarrhea can contribute to discomfort and soil the sacral wound ESBL and Enterococcus UTI  Acute kidney injury on chronic kidney disease stage IIIb  Suspect underlying cognitive issues Anemia-in the setting of acute illness, infection, chronic kidney disease.  There is also dilutional component as she received fluids.  Hemoglobin dipped to 6.9 on 7/17, received a unit of packed red blood cells, improved appropriately.  No bleeding.  Will not check further labs History of CVA -continue PEG tube, is essentially bedbound.  Family wishes for her to continue tube feeds PAF Urinary retention Hyponatremia  Scheduled Meds:  Chlorhexidine  Gluconate Cloth  6 each Topical Daily   feeding supplement (PROSource TF20)  60 mL Per Tube Daily   fiber supplement (BANATROL TF)  60 mL Per Tube BID   free water   200 mL Per Tube Q4H   Gerhardt's butt cream  1 Application Topical TID   latanoprost   1 drop Both Eyes QHS   lidocaine   1 Application Topical Daily   morphine  CONCENTRATE  10 mg Oral Q6H   nutrition supplement (JUVEN)  1 packet Per Tube BID   Rivaroxaban   15 mg Per Tube Q supper   vancomycin   125 mg Per Tube QID   Continuous Infusions:  feeding supplement (JEVITY 1.5 CAL/FIBER) 1,000 mL (03/31/24 1023)   PRN Meds:.acetaminophen  **OR** acetaminophen , fentaNYL  (SUBLIMAZE ) injection  Current Outpatient Medications  Medication Instructions   acetaminophen  (TYLENOL ) 1,000 mg, Oral, Every 6 hours PRN   aspirin  81 mg, Oral, Daily PRN   doxycycline (VIBRAMYCIN) 100 mg, 2 times  daily   ergocalciferol  (VITAMIN D2) 50,000 Units, Every 30 days   hydrALAZINE  (APRESOLINE ) 25 mg, Oral, 2 times daily   ipratropium-albuterol (DUONEB) 0.5-2.5 (3) MG/3ML SOLN 3 mLs, Inhalation, Every 4 hours PRN   LUMIGAN 0.01 % SOLN 1 drop, Both Eyes, Daily at bedtime    metoprolol  succinate (TOPROL -XL) 12.5 mg, Oral, Daily   morphine  CONCENTRATE 20 mg, Oral, Every 4 hours PRN   nitroGLYCERIN  (NITROSTAT ) 0.4 MG SL tablet PLACE 1 TABLET UNDER THE TONGUE EVERY 5 MINUTES AS NEEDED.   Rivaroxaban  (XARELTO ) 15 mg, Oral, Daily with supper   rosuvastatin  (CRESTOR ) 40 MG tablet TAKE 1 TABLET BY MOUTH EVERY DAY   Sodium Chloride  Flush (SALINE FLUSH IV) Intravenous, Every 4 hours   Soft Lens Products (SENSITIVE EYES SALINE) SOLN 1 drop, Does not apply, 2 times daily    Diet Orders (From admission, onward)     Start     Ordered   03/30/24 1001  DIET DYS 3 Room service appropriate? Yes; Fluid consistency: Thin  Diet effective now       Question Answer Comment  Room service appropriate? Yes   Fluid consistency: Thin      03/30/24 1000            DVT prophylaxis:  Rivaroxaban  (XARELTO ) tablet 15 mg   Lab Results  Component Value Date   PLT 463 (H) 03/27/2024      Code Status: Do not attempt resuscitation (DNR) - Comfort care  Family Communication: Daughter present at bedside   Status is: Inpatient Remains inpatient appropriate because: severity of illness  Level of care: Progressive  Consultants:  Palliative care  Objective: Vitals:   03/30/24 1142 03/30/24 1548 03/30/24 2024 03/31/24 0833  BP: (!) 108/45 (!) 104/50 (!) 108/47 107/63  Pulse: 80 79 84 68  Resp:   20 18  Temp: 99 F (37.2 C) 98.3 F (36.8 C) 99.2 F (37.3 C) (!) 101.4 F (38.6 C)  TempSrc: Axillary Axillary Axillary Axillary  SpO2: 95% 95% 98% 97%  Weight:      Height:        Intake/Output Summary (Last 24 hours) at 03/31/2024 1114 Last data filed at 03/31/2024 1015 Gross per 24 hour  Intake 2400 ml  Output 1300 ml  Net 1100 ml   Wt Readings from Last 3 Encounters:  03/28/24 81.9 kg  08/21/23 89.8 kg  06/21/23 90.5 kg    Examination:  Constitutional: NAD Eyes: lids and conjunctivae normal, no scleral icterus ENMT: mmm Neck: normal, supple Respiratory:  clear to auscultation bilaterally, no wheezing, no crackles. Normal respiratory effort.  Cardiovascular: Regular rate and rhythm, no murmurs / rubs / gallops. No LE edema. Abdomen: soft, no distention, no tenderness. Bowel sounds positive.   Data Reviewed: I have independently reviewed following labs and imaging studies   CBC Recent Labs  Lab 03/25/24 0242 03/26/24 0250 03/26/24 1038 03/27/24 0813  WBC 14.4* 11.0*  --  9.2  HGB 7.9* 6.9* 7.0* 9.0*  HCT 25.7* 21.8* 21.8* 27.9*  PLT 509* 501*  --  463*  MCV 83.7 81.6  --  80.2  MCH 25.7* 25.8*  --  25.9*  MCHC 30.7 31.7  --  32.3  RDW 17.0* 17.3*  --  17.8*  LYMPHSABS 1.6  --   --   --   MONOABS 0.9  --   --   --   EOSABS 0.3  --   --   --   BASOSABS 0.0  --   --   --  Recent Labs  Lab 03/25/24 0242 03/26/24 0250 03/27/24 0220 03/27/24 0813 03/28/24 0241  NA 140 136  --  134*  --   K 4.1 4.5  --  4.7  --   CL 110 109  --  104  --   CO2 21* 21*  --  22  --   GLUCOSE 122* 101*  --  104*  --   BUN 45* 50*  --  66*  --   CREATININE 1.90* 1.67*  --  1.58*  --   CALCIUM  9.8 9.1  --  9.1  --   AST 22 21  --  23  --   ALT 13 13  --  15  --   ALKPHOS 79 61  --  58  --   BILITOT 0.4 0.5  --  0.6  --   ALBUMIN  1.7* <1.5*  --  <1.5*  --   MG 2.0 1.9 1.9  --  1.8  CRP 11.9*  --   --   --   --     ------------------------------------------------------------------------------------------------------------------ No results for input(s): CHOL, HDL, LDLCALC, TRIG, CHOLHDL, LDLDIRECT in the last 72 hours.  Lab Results  Component Value Date   HGBA1C 6.0 (H) 08/20/2022   ------------------------------------------------------------------------------------------------------------------ No results for input(s): TSH, T4TOTAL, T3FREE, THYROIDAB in the last 72 hours.  Invalid input(s): FREET3  Cardiac Enzymes No results for input(s): CKMB, TROPONINI, MYOGLOBIN in the last 168 hours.  Invalid  input(s): CK ------------------------------------------------------------------------------------------------------------------    Component Value Date/Time   BNP 43.0 06/21/2023 1037    CBG: Recent Labs  Lab 03/27/24 2352 03/28/24 0445 03/28/24 0822 03/28/24 1200 03/28/24 1640  GLUCAP 107* 108* 108* 95 104*    Recent Results (from the past 240 hours)  Resp panel by RT-PCR (RSV, Flu A&B, Covid) In/Out Cath Urine     Status: None   Collection Time: 03/23/24  7:56 AM   Specimen: In/Out Cath Urine; Nasal Swab  Result Value Ref Range Status   SARS Coronavirus 2 by RT PCR NEGATIVE NEGATIVE Final   Influenza A by PCR NEGATIVE NEGATIVE Final   Influenza B by PCR NEGATIVE NEGATIVE Final    Comment: (NOTE) The Xpert Xpress SARS-CoV-2/FLU/RSV plus assay is intended as an aid in the diagnosis of influenza from Nasopharyngeal swab specimens and should not be used as a sole basis for treatment. Nasal washings and aspirates are unacceptable for Xpert Xpress SARS-CoV-2/FLU/RSV testing.  Fact Sheet for Patients: BloggerCourse.com  Fact Sheet for Healthcare Providers: SeriousBroker.it  This test is not yet approved or cleared by the [place]  FDA and has been authorized for detection and/or diagnosis of SARS-CoV-2 by FDA under an Emergency Use Authorization (EUA). This EUA will remain in effect (meaning this test can be used) for the duration of the COVID-19 declaration under Section 564(b)(1) of the Act, 21 U.S.C. section 360bbb-3(b)(1), unless the authorization is terminated or revoked.     Resp Syncytial Virus by PCR NEGATIVE NEGATIVE Final    Comment: (NOTE) Fact Sheet for Patients: BloggerCourse.com  Fact Sheet for Healthcare Providers: SeriousBroker.it  This test is not yet approved or cleared by the [place]  FDA and has been authorized for detection and/or  diagnosis of SARS-CoV-2 by FDA under an Emergency Use Authorization (EUA). This EUA will remain in effect (meaning this test can be used) for the duration of the COVID-19 declaration under Section 564(b)(1) of the Act, 21 U.S.C. section 360bbb-3(b)(1), unless the authorization is terminated or revoked.  Performed at [facility], [address] [place], [place] [address]   Culture, blood (Routine x 2)     Status: None   Collection Time: 03/23/24  8:02 AM   Specimen: BLOOD RIGHT ARM  Result Value Ref Range Status   Specimen Description BLOOD RIGHT ARM  Final   Special Requests   Final    BOTTLES DRAWN AEROBIC AND ANAEROBIC Blood Culture results may not be optimal due to an inadequate volume of blood received in culture bottles   Culture   Final    NO GROWTH 5 DAYS Performed at [facility], [address] [place], [place] [address]    Report Status 03/28/2024 FINAL  Final  Urine Culture     Status: Abnormal   Collection Time: 03/23/24 11:03 AM   Specimen: Urine, Random  Result Value Ref Range Status   Specimen Description URINE, RANDOM  Final   Special Requests   Final    NONE Reflexed from [id number] Performed at [facility], [address], [place], [place] [address]    Culture (A)  Final    >=100,000 COLONIES/mL ESCHERICHIA COLI Confirmed Extended Spectrum Beta-Lactamase Producer (ESBL).  In bloodstream infections from ESBL organisms, carbapenems are preferred over piperacillin/tazobactam. They are shown to have a lower risk of mortality. 40,000 COLONIES/mL ENTEROCOCCUS FAECALIS    Report Status 03/26/2024 FINAL  Final   Organism ID, Bacteria ESCHERICHIA COLI (A)  Final   Organism ID, Bacteria ENTEROCOCCUS FAECALIS (A)  Final      Susceptibility   Escherichia coli - MIC*    AMPICILLIN >=32 RESISTANT Resistant     CEFAZOLIN >=64 RESISTANT Resistant     CEFEPIME >=32 RESISTANT Resistant     CEFTRIAXONE  >=64 RESISTANT Resistant     CIPROFLOXACIN >=4  RESISTANT Resistant     GENTAMICIN <=1 SENSITIVE Sensitive     IMIPENEM <=0.25 SENSITIVE Sensitive     NITROFURANTOIN <=16 SENSITIVE Sensitive     TRIMETH/SULFA <=20 SENSITIVE Sensitive     AMPICILLIN/SULBACTAM >=32 RESISTANT Resistant     PIP/TAZO 64 INTERMEDIATE Intermediate ug/mL    * >=100,000 COLONIES/mL ESCHERICHIA COLI   Enterococcus faecalis - MIC*    AMPICILLIN <=2 SENSITIVE Sensitive     NITROFURANTOIN <=16 SENSITIVE Sensitive     VANCOMYCIN  1 SENSITIVE Sensitive     * 40,000 COLONIES/mL ENTEROCOCCUS FAECALIS  Culture, blood (Routine x 2)     Status: None   Collection Time: 03/23/24  4:55 PM   Specimen: BLOOD LEFT ARM  Result Value Ref Range Status   Specimen Description BLOOD LEFT ARM  Final   Special Requests   Final    BOTTLES DRAWN AEROBIC ONLY Blood Culture results may not be optimal due to an inadequate volume of blood received in culture bottles   Culture   Final    NO GROWTH 5 DAYS Performed at [facility], [address] [place], [place] [address]    Report Status 03/28/2024 FINAL  Final  C Difficile Quick Screen w PCR reflex     Status: Abnormal   Collection Time: 03/25/24 12:14 AM   Specimen: STOOL  Result Value Ref Range Status   C Diff antigen POSITIVE (A) NEGATIVE Final   C Diff toxin NEGATIVE NEGATIVE Final   C Diff interpretation Results are indeterminate. See PCR results.  Final    Comment: Performed at [facility], [address] [place], [place] [address]  C. Diff by PCR, Reflexed  Status: Abnormal   Collection Time: 03/25/24 12:14 AM  Result Value Ref Range Status   Toxigenic C. Difficile by PCR POSITIVE (A) NEGATIVE Final    Comment: Positive for toxigenic C. difficile with little to no toxin production. Only treat if clinical presentation suggests symptomatic illness. Performed at [facility], [address] [place], [place] [address]      Radiology Studies: No results found.   Nilda Fendt, MD, PhD [facility]  Between 7 am - 7 pm I am available, please contact me via Amion (for emergencies) or Securechat (non urgent messages)  Between 7 pm - 7 am I am not available, please contact night coverage MD/APP via Amion

## 2024-03-31 NOTE — TOC Progression Note (Signed)
 Transition of Care [facility]) - Progression Note    Patient Details  Name: [name] MRN: [id number] Date of Birth: [birth year]  Transition of Care [facility] CM/SW Contact  Waddell Barnie Rama, RN Phone Number: 03/31/2024, 2:27 PM  Clinical Narrative:    Per MD note, Supposed to go home with hospice, however there is worked on in the primary residence and family is unable to take her until Thursday or Friday this week. Continue comfort measures, pain control, Tylenol  for fever .  IP CM will continue to follow.         Expected Discharge Plan and Services                                               Social Determinants of Health (SDOH) Interventions SDOH Screenings   Food Insecurity: No Food Insecurity (03/24/2024)  Housing: Low Risk  (03/24/2024)  Transportation Needs: No Transportation Needs (03/24/2024)  Utilities: Not At Risk (03/24/2024)  Depression (PHQ2-9): Low Risk  (05/03/2023)  Financial Resource Strain: Low Risk  (07/17/2023)   Received from [facility]  Physical Activity: Inactive (07/17/2023)   Received from [facility]  Social Connections: Moderately Integrated (03/24/2024)  Stress: No Stress Concern Present (07/17/2023)   Received from [facility]  Tobacco Use: Low Risk  (03/23/2024)  Health Literacy: High Risk (07/17/2023)   Received from [facility]    Readmission Risk Interventions    03/24/2024    2:26 PM 06/12/2023   10:22 AM 03/25/2023    3:24 PM  Readmission Risk Prevention Plan  Post Dischage Appt   Complete  Medication Screening   Complete  Transportation Screening Complete Complete Complete  PCP or Specialist Appt within 5-7 Days Complete Complete   Home Care Screening Complete Complete   Medication Review (RN CM) Complete Referral to Pharmacy

## 2024-04-01 DIAGNOSIS — A498 Other bacterial infections of unspecified site: Secondary | ICD-10-CM | POA: Diagnosis not present

## 2024-04-01 DIAGNOSIS — A419 Sepsis, unspecified organism: Principal | ICD-10-CM

## 2024-04-01 DIAGNOSIS — R509 Fever, unspecified: Secondary | ICD-10-CM | POA: Diagnosis not present

## 2024-04-01 DIAGNOSIS — R6521 Severe sepsis with septic shock: Secondary | ICD-10-CM

## 2024-04-01 MED ORDER — GLYCOPYRROLATE 1 MG PO TABS
1.0000 mg | ORAL_TABLET | Freq: Three times a day (TID) | ORAL | Status: DC
  Administered 2024-04-01 – 2024-04-03 (×7): 1 mg via ORAL
  Filled 2024-04-01 (×9): qty 1

## 2024-04-01 NOTE — Progress Notes (Signed)
 PROGRESS NOTE  Barrett ROMAINE MACIOLEK FMW:[id number] DOB: 11-14-1933   PCP: Pcp, No  Patient is from: Home with daughter  DOA: 03/23/2024 LOS: 9  Chief complaints Chief Complaint  Patient presents with   Fever     Brief Narrative / Interim history: 89 year old female with HTN, HLD, CAD, CVA, chronic debility and essentially bedbound and wheelchair-bound, CKD 3A comes into the hospital with worsening lethargy. [name] is an [employer] fundraiser and primary caregiver. She was admitted to the hospital with empiric diagnosis of sacral osteomyelitis and UTI. In hospital day 1 she also has had profuse diarrhea requiring rectal tube placement. C. difficile was positive   Subjective: Seen and examined earlier this morning.  No major events overnight or this morning. Not conversing.  Daughter at bedside.  Concerned about productive cough and diet texture.  Objective: Vitals:   03/31/24 1206 03/31/24 1319 03/31/24 2049 04/01/24 0844  BP: 97/62  (!) 115/51 (!) 121/55  Pulse: 66  77 84  Resp:   17 18  Temp:  98.8 F (37.1 C) 98.6 F (37 C) 100.1 F (37.8 C)  TempSrc:  Axillary Axillary Axillary  SpO2: 96%  99% 97%  Weight:      Height:        Examination:  GENERAL: No apparent distress.  Nontoxic. HEENT: MMM.  Vision and hearing grossly intact.  NECK: Supple.  No apparent JVD.  RESP:  No IWOB.  Fair aeration bilaterally.  Rhonchi. CVS:  RRR. Heart sounds normal.  ABD/GI/GU: BS+. Abd soft, NTND.  MSK/EXT:  Moves extremities. No apparent deformity. No edema.  SKIN: no apparent skin lesion or wound NEURO: AA.  Not able to assess orientation.  No apparent focal neuro deficit. PSYCH: Calm. Normal affect.   Consultants:  Infectious disease Palliative medicine  Procedures: None  Microbiology summarized: 7/14-COVID-19, influenza and RSV PCR nonreactive 7/14-urine culture with Enterococcus faecalis and ESBL E. Coli 7/14-blood cultures NGTD 7/16-C. difficile positive  Assessment and  plan: Severe sepsis secondary to sacral osteomyelitis, UTI -she met sepsis criteria on admission with fever, hypotension, tachypnea as well as leukocytosis.  CT scan on admission with sacral wound overlying the distal sacrococcygeal spine with suspicion for underlying osteomyelitis.  She initially was placed on antibiotics. Palliative care was consulted given progressive decline, she is essentially bedbound, and an extremely high likelihood that her sacral wound will never heal but continue to contribute to worsening pain, suffering and loss of quality of life.  Goals of care discussions were held with the family on 7/18, now transitioned to comfort, discontinue all unnecessary antibiotics except for p.o. vancomycin  for her C. Difficile. -Plan for discharge home with home hospice but family unable to take care until Friday. -Continue comfort measures, pain control, Tylenol  for fever without escalation of antibiotics -Add glycopyrrolate  for secretion. -Changed diet to dysphagia 2 at request of patient's daughter   C. difficile colitis -patient has had profuse diarrhea on 7/15 with 10+ liquid bowel movements requiring rectal tube placement.  C. difficile was positive but negative toxin.  Given clinical picture with numerous loose bowel movements, treat with p.o. vancomycin .  Complete a 10-day course for comfort purposes, as severe diarrhea can contribute to discomfort and soil the sacral wound  Goal of care-full comfort care  ESBL E. coli and Enterococcus UTI  AKI on CKD-3B Suspect underlying cognitive issues Anemia-in the setting of acute illness, infection, renal failure.  Hgb 6.9 but improved after 1 unit on 7/17.  History of CVA -continue PEG tube, is  essentially bedbound.  Family wishes for her to continue tube feeds PAF-continue Xarelto  Urinary retention Hyponatremia Class I obesity Body mass index is 30.05 kg/m.  Nutrition Nutrition Problem: Inadequate oral intake Etiology: inability to  eat Signs/Symptoms: NPO status Interventions: Refer to RD note for recommendations   DVT prophylaxis:   Rivaroxaban  (XARELTO ) tablet 15 mg  Code Status: NR-comfort Family Communication: Updated patient's daughter at bedside Level of care: Progressive Status is: Inpatient   Final disposition: Home with home hospice   35 minutes with more than 50% spent in reviewing records, counseling patient/family and coordinating care.   Sch Meds:  Scheduled Meds:  Chlorhexidine  Gluconate Cloth  6 each Topical Daily   feeding supplement (PROSource TF20)  60 mL Per Tube Daily   fiber supplement (BANATROL TF)  60 mL Per Tube BID   free water   200 mL Per Tube Q4H   Gerhardt's butt cream  1 Application Topical TID   glycopyrrolate   1 mg Oral TID   latanoprost   1 drop Both Eyes QHS   lidocaine   1 Application Topical Daily   morphine  CONCENTRATE  10 mg Oral Q6H   nutrition supplement (JUVEN)  1 packet Per Tube BID   Rivaroxaban   15 mg Per Tube Q supper   vancomycin   125 mg Per Tube QID   Continuous Infusions:  feeding supplement (JEVITY 1.5 CAL/FIBER) 60 mL/hr at 04/01/24 1300   PRN Meds:.acetaminophen  **OR** acetaminophen , fentaNYL  (SUBLIMAZE ) injection  Antimicrobials: Anti-infectives (From admission, onward)    Start     Dose/Rate Route Frequency Ordered Stop   03/27/24 1700  vancomycin  (VANCOCIN ) IVPB 1000 mg/200 mL premix  Status:  Discontinued        1,000 mg 200 mL/hr over 60 Minutes Intravenous Every 48 hours 03/27/24 1010 03/28/24 0636   03/25/24 2200  meropenem  (MERREM ) 500 mg in sodium chloride  0.9 % 100 mL IVPB  Status:  Discontinued        500 mg 200 mL/hr over 30 Minutes Intravenous Every 12 hours 03/25/24 1147 03/27/24 2018   03/25/24 1500  vancomycin  (VANCOCIN ) IVPB 1000 mg/200 mL premix        1,000 mg 200 mL/hr over 60 Minutes Intravenous  Once 03/25/24 1447 03/26/24 0105   03/25/24 1447  vancomycin  variable dose per unstable renal function (pharmacist dosing)   Status:  Discontinued         Does not apply See admin instructions 03/25/24 1447 03/27/24 1012   03/25/24 1115  vancomycin  (VANCOCIN ) 50 mg/mL oral solution SOLN 125 mg        125 mg Per Tube 4 times daily 03/25/24 1017 04/04/24 0959   03/25/24 0800  vancomycin  (VANCOCIN ) capsule 125 mg  Status:  Discontinued        125 mg Oral 3 times daily before meals & bedtime 03/25/24 0721 03/25/24 1018   03/23/24 2200  meropenem  (MERREM ) 1 g in sodium chloride  0.9 % 100 mL IVPB  Status:  Discontinued        1 g 200 mL/hr over 30 Minutes Intravenous Every 12 hours 03/23/24 2026 03/25/24 1147   03/23/24 2026  vancomycin  variable dose per unstable renal function (pharmacist dosing)  Status:  Discontinued         Does not apply See admin instructions 03/23/24 2026 03/24/24 1346   03/23/24 1000  vancomycin  (VANCOCIN ) IVPB 1000 mg/200 mL premix       Placed in Followed by Linked Group   1,000 mg 200 mL/hr over 60 Minutes Intravenous  Once 03/23/24 0800 03/23/24 1044   03/23/24 0900  vancomycin  (VANCOCIN ) IVPB 1000 mg/200 mL premix       Placed in Followed by Linked Group   1,000 mg 200 mL/hr over 60 Minutes Intravenous  Once 03/23/24 0800 03/23/24 0908   03/23/24 0845  meropenem  (MERREM ) 1 g in sodium chloride  0.9 % 100 mL IVPB        1 g 200 mL/hr over 30 Minutes Intravenous  Once 03/23/24 0836 03/23/24 0946   03/23/24 0800  vancomycin  (VANCOCIN ) IVPB 1000 mg/200 mL premix  Status:  Discontinued        1,000 mg 200 mL/hr over 60 Minutes Intravenous  Once 03/23/24 0756 03/23/24 0800        I have personally reviewed the following labs and images: CBC: Recent Labs  Lab 03/26/24 0250 03/26/24 1038 03/27/24 0813  WBC 11.0*  --  9.2  HGB 6.9* 7.0* 9.0*  HCT 21.8* 21.8* 27.9*  MCV 81.6  --  80.2  PLT 501*  --  463*   BMP &GFR Recent Labs  Lab 03/26/24 0250 03/27/24 0220 03/27/24 0813 03/28/24 0241  NA 136  --  134*  --   K 4.5  --  4.7  --   CL 109  --  104  --   CO2 21*  --  22   --   GLUCOSE 101*  --  104*  --   BUN 50*  --  66*  --   CREATININE 1.67*  --  1.58*  --   CALCIUM  9.1  --  9.1  --   MG 1.9 1.9  --  1.8  PHOS 1.7* 3.0  --   --    Estimated Creatinine Clearance: 26.5 mL/min (A) (by C-G formula based on SCr of 1.58 mg/dL (H)). Liver & Pancreas: Recent Labs  Lab 03/26/24 0250 03/27/24 0813  AST 21 23  ALT 13 15  ALKPHOS 61 58  BILITOT 0.5 0.6  PROT 4.9* 5.1*  ALBUMIN  <1.5* <1.5*   No results for input(s): LIPASE, AMYLASE in the last 168 hours. No results for input(s): AMMONIA in the last 168 hours. Diabetic: No results for input(s): HGBA1C in the last 72 hours. Recent Labs  Lab 03/27/24 2352 03/28/24 0445 03/28/24 0822 03/28/24 1200 03/28/24 1640  GLUCAP 107* 108* 108* 95 104*   Cardiac Enzymes: No results for input(s): CKTOTAL, CKMB, CKMBINDEX, TROPONINI in the last 168 hours. No results for input(s): PROBNP in the last 8760 hours. Coagulation Profile: No results for input(s): INR, PROTIME in the last 168 hours. Thyroid  Function Tests: No results for input(s): TSH, T4TOTAL, FREET4, T3FREE, THYROIDAB in the last 72 hours. Lipid Profile: No results for input(s): CHOL, HDL, LDLCALC, TRIG, CHOLHDL, LDLDIRECT in the last 72 hours. Anemia Panel: No results for input(s): VITAMINB12, FOLATE, FERRITIN, TIBC, IRON , RETICCTPCT in the last 72 hours. Urine analysis:    Component Value Date/Time   COLORURINE YELLOW 03/23/2024 1103   APPEARANCEUR TURBID (A) 03/23/2024 1103   LABSPEC 1.012 03/23/2024 1103   PHURINE 6.0 03/23/2024 1103   GLUCOSEU NEGATIVE 03/23/2024 1103   HGBUR SMALL (A) 03/23/2024 1103   BILIRUBINUR NEGATIVE 03/23/2024 1103   KETONESUR NEGATIVE 03/23/2024 1103   PROTEINUR 100 (A) 03/23/2024 1103   UROBILINOGEN 1.0 07/11/2014 0914   NITRITE NEGATIVE 03/23/2024 1103   LEUKOCYTESUR LARGE (A) 03/23/2024 1103   Sepsis Labs: Invalid input(s): PROCALCITONIN,  LACTICIDVEN  Microbiology: Recent Results (from the past 240 hours)  Resp panel by RT-PCR (RSV,  Flu A&B, Covid) In/Out Cath Urine     Status: None   Collection Time: 03/23/24  7:56 AM   Specimen: In/Out Cath Urine; Nasal Swab  Result Value Ref Range Status   SARS Coronavirus 2 by RT PCR NEGATIVE NEGATIVE Final   Influenza A by PCR NEGATIVE NEGATIVE Final   Influenza B by PCR NEGATIVE NEGATIVE Final    Comment: (NOTE) The Xpert Xpress SARS-CoV-2/FLU/RSV plus assay is intended as an aid in the diagnosis of influenza from Nasopharyngeal swab specimens and should not be used as a sole basis for treatment. Nasal washings and aspirates are unacceptable for Xpert Xpress SARS-CoV-2/FLU/RSV testing.  Fact Sheet for Patients: BloggerCourse.com  Fact Sheet for Healthcare Providers: SeriousBroker.it  This test is not yet approved or cleared by the [place]  FDA and has been authorized for detection and/or diagnosis of SARS-CoV-2 by FDA under an Emergency Use Authorization (EUA). This EUA will remain in effect (meaning this test can be used) for the duration of the COVID-19 declaration under Section 564(b)(1) of the Act, 21 U.S.C. section 360bbb-3(b)(1), unless the authorization is terminated or revoked.     Resp Syncytial Virus by PCR NEGATIVE NEGATIVE Final    Comment: (NOTE) Fact Sheet for Patients: BloggerCourse.com  Fact Sheet for Healthcare Providers: SeriousBroker.it  This test is not yet approved or cleared by the [place]  FDA and has been authorized for detection and/or diagnosis of SARS-CoV-2 by FDA under an Emergency Use Authorization (EUA). This EUA will remain in effect (meaning this test can be used) for the duration of the COVID-19 declaration under Section 564(b)(1) of the Act, 21 U.S.C. section 360bbb-3(b)(1), unless the authorization is terminated  or revoked.  Performed at [facility], [address], [place], [place] [address]   Culture, blood (Routine x 2)     Status: None   Collection Time: 03/23/24  8:02 AM   Specimen: BLOOD RIGHT ARM  Result Value Ref Range Status   Specimen Description BLOOD RIGHT ARM  Final   Special Requests   Final    BOTTLES DRAWN AEROBIC AND ANAEROBIC Blood Culture results may not be optimal due to an inadequate volume of blood received in culture bottles   Culture   Final    NO GROWTH 5 DAYS Performed at [facility], [address], [place], [place] [address]    Report Status 03/28/2024 FINAL  Final  Urine Culture     Status: Abnormal   Collection Time: 03/23/24 11:03 AM   Specimen: Urine, Random  Result Value Ref Range Status   Specimen Description URINE, RANDOM  Final   Special Requests   Final    NONE Reflexed from [id number] Performed at [facility], [address] [place], [place] [address]    Culture (A)  Final    >=100,000 COLONIES/mL ESCHERICHIA COLI Confirmed Extended Spectrum Beta-Lactamase Producer (ESBL).  In bloodstream infections from ESBL organisms, carbapenems are preferred over piperacillin/tazobactam. They are shown to have a lower risk of mortality. 40,000 COLONIES/mL ENTEROCOCCUS FAECALIS    Report Status 03/26/2024 FINAL  Final   Organism ID, Bacteria ESCHERICHIA COLI (A)  Final   Organism ID, Bacteria ENTEROCOCCUS FAECALIS (A)  Final      Susceptibility   Escherichia coli - MIC*    AMPICILLIN >=32 RESISTANT Resistant     CEFAZOLIN >=64 RESISTANT Resistant     CEFEPIME >=32 RESISTANT Resistant     CEFTRIAXONE  >=64 RESISTANT Resistant     CIPROFLOXACIN >=4 RESISTANT  Resistant     GENTAMICIN <=1 SENSITIVE Sensitive     IMIPENEM <=0.25 SENSITIVE Sensitive     NITROFURANTOIN <=16 SENSITIVE Sensitive     TRIMETH/SULFA <=20 SENSITIVE Sensitive     AMPICILLIN/SULBACTAM >=32 RESISTANT Resistant     PIP/TAZO 64 INTERMEDIATE Intermediate ug/mL    *  >=100,000 COLONIES/mL ESCHERICHIA COLI   Enterococcus faecalis - MIC*    AMPICILLIN <=2 SENSITIVE Sensitive     NITROFURANTOIN <=16 SENSITIVE Sensitive     VANCOMYCIN  1 SENSITIVE Sensitive     * 40,000 COLONIES/mL ENTEROCOCCUS FAECALIS  Culture, blood (Routine x 2)     Status: None   Collection Time: 03/23/24  4:55 PM   Specimen: BLOOD LEFT ARM  Result Value Ref Range Status   Specimen Description BLOOD LEFT ARM  Final   Special Requests   Final    BOTTLES DRAWN AEROBIC ONLY Blood Culture results may not be optimal due to an inadequate volume of blood received in culture bottles   Culture   Final    NO GROWTH 5 DAYS Performed at [facility], [address], [place], [place] [address]    Report Status 03/28/2024 FINAL  Final  C Difficile Quick Screen w PCR reflex     Status: Abnormal   Collection Time: 03/25/24 12:14 AM   Specimen: STOOL  Result Value Ref Range Status   C Diff antigen POSITIVE (A) NEGATIVE Final   C Diff toxin NEGATIVE NEGATIVE Final   C Diff interpretation Results are indeterminate. See PCR results.  Final    Comment: Performed at [facility], [address], [place], [place] [address]  C. Diff by PCR, Reflexed     Status: Abnormal   Collection Time: 03/25/24 12:14 AM  Result Value Ref Range Status   Toxigenic C. Difficile by PCR POSITIVE (A) NEGATIVE Final    Comment: Positive for toxigenic C. difficile with little to no toxin production. Only treat if clinical presentation suggests symptomatic illness. Performed at [facility], [address] [place], [place] [address]     Radiology Studies: No results found.    Evalene Vath T. Yasuko Lapage [facility]  If 7PM-7AM, please contact night-coverage [email or web address] 04/01/2024, 4:26 PM

## 2024-04-01 NOTE — Plan of Care (Signed)
  Problem: Fluid Volume: Goal: Hemodynamic stability will improve Outcome: Not Progressing   Problem: Clinical Measurements: Goal: Diagnostic test results will improve Outcome: Not Progressing Goal: Signs and symptoms of infection will decrease Outcome: Not Progressing   Problem: Respiratory: Goal: Ability to maintain adequate ventilation will improve Outcome: Not Progressing   Problem: Education: Goal: Knowledge of General Education information will improve Description: Including pain rating scale, medication(s)/side effects and non-pharmacologic comfort measures Outcome: Not Progressing   Problem: Health Behavior/Discharge Planning: Goal: Ability to manage health-related needs will improve Outcome: Not Progressing   Problem: Clinical Measurements: Goal: Ability to maintain clinical measurements within normal limits will improve Outcome: Not Progressing Goal: Will remain free from infection Outcome: Not Progressing Goal: Diagnostic test results will improve Outcome: Not Progressing Goal: Respiratory complications will improve Outcome: Not Progressing Goal: Cardiovascular complication will be avoided Outcome: Not Progressing   Problem: Activity: Goal: Risk for activity intolerance will decrease Outcome: Not Progressing   Problem: Nutrition: Goal: Adequate nutrition will be maintained Outcome: Not Progressing   Problem: Coping: Goal: Level of anxiety will decrease Outcome: Not Progressing   Problem: Elimination: Goal: Will not experience complications related to bowel motility Outcome: Not Progressing Goal: Will not experience complications related to urinary retention Outcome: Not Progressing   Problem: Pain Managment: Goal: General experience of comfort will improve and/or be controlled Outcome: Not Progressing   Problem: Safety: Goal: Ability to remain free from injury will improve Outcome: Not Progressing   Problem: Skin Integrity: Goal: Risk for  impaired skin integrity will decrease Outcome: Not Progressing

## 2024-04-01 NOTE — Plan of Care (Signed)
  Problem: Fluid Volume: Goal: Hemodynamic stability will improve Outcome: Progressing   Problem: Clinical Measurements: Goal: Diagnostic test results will improve Outcome: Progressing Goal: Signs and symptoms of infection will decrease Outcome: Progressing   Problem: Respiratory: Goal: Ability to maintain adequate ventilation will improve Outcome: Progressing   Problem: Health Behavior/Discharge Planning: Goal: Ability to manage health-related needs will improve Outcome: Progressing   Problem: Clinical Measurements: Goal: Ability to maintain clinical measurements within normal limits will improve Outcome: Progressing Goal: Will remain free from infection Outcome: Progressing Goal: Diagnostic test results will improve Outcome: Progressing Goal: Respiratory complications will improve Outcome: Progressing Goal: Cardiovascular complication will be avoided Outcome: Progressing   Problem: Activity: Goal: Risk for activity intolerance will decrease Outcome: Progressing   Problem: Nutrition: Goal: Adequate nutrition will be maintained Outcome: Progressing   Problem: Coping: Goal: Level of anxiety will decrease Outcome: Progressing   Problem: Elimination: Goal: Will not experience complications related to bowel motility Outcome: Progressing Goal: Will not experience complications related to urinary retention Outcome: Progressing   Problem: Safety: Goal: Ability to remain free from injury will improve Outcome: Progressing   Problem: Skin Integrity: Goal: Risk for impaired skin integrity will decrease Outcome: Progressing

## 2024-04-01 NOTE — Progress Notes (Signed)
 Jolynn Pack [phone] - [facility] Liaison Note   Following from [facility] referral and plan to discharge to daughter's home.    Plan remains to discharge Thursday or Friday due to work being done at [employer] home and necessary clean up.    Updated with DME requested and they will contact [employer] to schedule delivery.    Inpatient team updated with this information.    Please send signed and completed DNR home with patient/family. Please provide prescriptions at discharge as needed to ensure ongoing symptom management.    [facility] information and contact numbers given to daughters. Above information shared with Transitions of Care Manager. Please call with any questions or concerns.    Thank you for the opportunity to participate in this patient's care.    Amy Darien BSN, RN [facility] Liaison [phone]

## 2024-04-02 DIAGNOSIS — A498 Other bacterial infections of unspecified site: Secondary | ICD-10-CM

## 2024-04-02 DIAGNOSIS — A419 Sepsis, unspecified organism: Secondary | ICD-10-CM | POA: Diagnosis not present

## 2024-04-02 DIAGNOSIS — R6521 Severe sepsis with septic shock: Secondary | ICD-10-CM | POA: Diagnosis not present

## 2024-04-02 DIAGNOSIS — R509 Fever, unspecified: Secondary | ICD-10-CM | POA: Diagnosis not present

## 2024-04-02 MED ORDER — BANATROL TF EN LIQD
60.0000 mL | Freq: Two times a day (BID) | ENTERAL | Status: DC
  Administered 2024-04-02 – 2024-04-03 (×2): 60 mL via ORAL
  Filled 2024-04-02 (×3): qty 60

## 2024-04-02 MED ORDER — JUVEN PO PACK
1.0000 | PACK | Freq: Two times a day (BID) | ORAL | Status: DC
  Administered 2024-04-02: 1 via ORAL
  Filled 2024-04-02: qty 1

## 2024-04-02 NOTE — Plan of Care (Signed)
   Problem: Education: Goal: Knowledge of General Education information will improve Description Including pain rating scale, medication(s)/side effects and non-pharmacologic comfort measures Outcome: Progressing

## 2024-04-02 NOTE — Progress Notes (Signed)
 Jolynn Pack [phone] - [facility] Liaison Note   Following from [facility] referral and plan to discharge to daughter's home.    Plan remains to discharge Friday due to work being done at Anna's home and necessary clean up.    DME scheduled to be delivered today.   Inpatient team updated with this information.    Please send signed and completed DNR home with patient/family. Please provide prescriptions at discharge as needed to ensure ongoing symptom management.    [facility] information and contact numbers given to daughters. Above information shared with Transitions of Care Manager. Please call with any questions or concerns.    Thank you for the opportunity to participate in this patient's care.    Amy Darien BSN, RN [facility] Liaison [phone]

## 2024-04-02 NOTE — Plan of Care (Signed)
  Problem: Fluid Volume: Goal: Hemodynamic stability will improve Outcome: Not Progressing   Problem: Clinical Measurements: Goal: Diagnostic test results will improve Outcome: Not Progressing Goal: Signs and symptoms of infection will decrease Outcome: Not Progressing   Problem: Respiratory: Goal: Ability to maintain adequate ventilation will improve Outcome: Not Progressing   Problem: Education: Goal: Knowledge of General Education information will improve Description: Including pain rating scale, medication(s)/side effects and non-pharmacologic comfort measures Outcome: Not Progressing   Problem: Health Behavior/Discharge Planning: Goal: Ability to manage health-related needs will improve Outcome: Not Progressing   Problem: Clinical Measurements: Goal: Ability to maintain clinical measurements within normal limits will improve Outcome: Not Progressing Goal: Will remain free from infection Outcome: Not Progressing Goal: Diagnostic test results will improve Outcome: Not Progressing Goal: Respiratory complications will improve Outcome: Not Progressing Goal: Cardiovascular complication will be avoided Outcome: Not Progressing   Problem: Activity: Goal: Risk for activity intolerance will decrease Outcome: Not Progressing   Problem: Nutrition: Goal: Adequate nutrition will be maintained Outcome: Not Progressing   Problem: Coping: Goal: Level of anxiety will decrease Outcome: Not Progressing   Problem: Elimination: Goal: Will not experience complications related to bowel motility Outcome: Not Progressing Goal: Will not experience complications related to urinary retention Outcome: Not Progressing   Problem: Pain Managment: Goal: General experience of comfort will improve and/or be controlled Outcome: Not Progressing   Problem: Safety: Goal: Ability to remain free from injury will improve Outcome: Not Progressing   Problem: Skin Integrity: Goal: Risk for  impaired skin integrity will decrease Outcome: Not Progressing

## 2024-04-02 NOTE — Progress Notes (Signed)
 PROGRESS NOTE  [name] FMW:[id number] DOB: 24-Mar-1934   PCP: Pcp, No  Patient is from: Home with daughter  DOA: 03/23/2024 LOS: 10  Chief complaints Chief Complaint  Patient presents with   Fever     Brief Narrative / Interim history: 89 year old female with HTN, HLD, CAD, CVA, chronic debility and essentially bedbound and wheelchair-bound, CKD 3A comes into the hospital with worsening lethargy. [name] is an [employer] fundraiser and primary caregiver. She was admitted to the hospital with empiric diagnosis of sacral osteomyelitis and UTI. In hospital day 1 she also has had profuse diarrhea requiring rectal tube placement. C. difficile was positive   Subjective: Seen and examined earlier this morning.  No major events overnight or this morning.  Sleeping.  No apparent distress.  [name] at bedside.  No questions or concerns.  Objective: Vitals:   03/31/24 2049 04/01/24 0844 04/01/24 2127 04/02/24 0348  BP: (!) 115/51 (!) 121/55 (!) 113/51 (!) 116/56  Pulse: 77 84 84 85  Resp: 17 18 17 17   Temp: 98.6 F (37 C) 100.1 F (37.8 C) (!) 100.8 F (38.2 C) 98.9 F (37.2 C)  TempSrc: Axillary Axillary Axillary Axillary  SpO2: 99% 97% 95% 97%  Weight:      Height:        Examination:  GENERAL: No apparent distress.  Sleeping. RESP:  No IWOB.  On room air. CVS:  RRR. Heart sounds normal.  MSK/EXT:  No apparent deformity. No edema.  NEURO: Sleeping. PSYCH: Calm.  No agitation.  Consultants:  Infectious disease Palliative medicine  Procedures: None  Microbiology summarized: 7/14-COVID-19, influenza and RSV PCR nonreactive 7/14-urine culture with Enterococcus faecalis and ESBL E. Coli 7/14-blood cultures NGTD 7/16-C. difficile positive  Assessment and plan: Severe sepsis secondary to sacral osteomyelitis, UTI -she met sepsis criteria on admission with fever, hypotension, tachypnea as well as leukocytosis.  CT scan on admission with sacral wound overlying the  distal sacrococcygeal spine with suspicion for underlying osteomyelitis.  She initially was placed on antibiotics. Palliative care was consulted given progressive decline, she is essentially bedbound, and an extremely high likelihood that her sacral wound will never heal but continue to contribute to worsening pain, suffering and loss of quality of life.  Goals of care discussions were held with the family on 7/18, now transitioned to comfort, discontinue all unnecessary antibiotics except for p.o. vancomycin  for her C. Difficile. -Plan for discharge home with home hospice but family unable to take care until Friday. -Continue comfort measures, pain control, Robinul  for secretion, Tylenol  for fever without escalation of antibiotics -Continue dysphagia 2 at daughter's request.   C. difficile colitis -patient has had profuse diarrhea on 7/15 with 10+ liquid bowel movements requiring rectal tube placement.  C. difficile was positive but negative toxin.  Given clinical picture with numerous loose bowel movements, treat with p.o. vancomycin .  Complete a 10-day course for comfort purposes, as severe diarrhea can contribute to discomfort and soil the sacral wound  Goal of care-full comfort care  ESBL E. coli and Enterococcus UTI  AKI on CKD-3B Suspect underlying cognitive issues Anemia-in the setting of acute illness, infection, renal failure.  Hgb 6.9 but improved after 1 unit on 7/17.  History of CVA -continue PEG tube, is essentially bedbound.  Family wishes for her to continue tube feeds PAF-continue Xarelto  Urinary retention Hyponatremia Class I obesity Body mass index is 30.05 kg/m.  Nutrition Nutrition Problem: Inadequate oral intake Etiology: inability to eat Signs/Symptoms: NPO status Interventions: Refer to  RD note for recommendations   DVT prophylaxis:   Rivaroxaban  (XARELTO ) tablet 15 mg  Code Status: NR-comfort Family Communication: Updated patient's daughter at bedside Level of  care: Progressive Status is: Inpatient   Final disposition: Home with home hospice   35 minutes with more than 50% spent in reviewing records, counseling patient/family and coordinating care.   Sch Meds:  Scheduled Meds:  Chlorhexidine  Gluconate Cloth  6 each Topical Daily   feeding supplement (PROSource TF20)  60 mL Per Tube Daily   fiber supplement (BANATROL TF)  60 mL Per Tube BID   free water   200 mL Per Tube Q4H   Gerhardt's butt cream  1 Application Topical TID   glycopyrrolate   1 mg Oral TID   latanoprost   1 drop Both Eyes QHS   lidocaine   1 Application Topical Daily   morphine  CONCENTRATE  10 mg Oral Q6H   nutrition supplement (JUVEN)  1 packet Per Tube BID   Rivaroxaban   15 mg Per Tube Q supper   vancomycin   125 mg Per Tube QID   Continuous Infusions:  feeding supplement (JEVITY 1.5 CAL/FIBER) 1,000 mL (04/02/24 0354)   PRN Meds:.acetaminophen  **OR** acetaminophen , fentaNYL  (SUBLIMAZE ) injection  Antimicrobials: Anti-infectives (From admission, onward)    Start     Dose/Rate Route Frequency Ordered Stop   03/27/24 1700  vancomycin  (VANCOCIN ) IVPB 1000 mg/200 mL premix  Status:  Discontinued        1,000 mg 200 mL/hr over 60 Minutes Intravenous Every 48 hours 03/27/24 1010 03/28/24 0636   03/25/24 2200  meropenem  (MERREM ) 500 mg in sodium chloride  0.9 % 100 mL IVPB  Status:  Discontinued        500 mg 200 mL/hr over 30 Minutes Intravenous Every 12 hours 03/25/24 1147 03/27/24 2018   03/25/24 1500  vancomycin  (VANCOCIN ) IVPB 1000 mg/200 mL premix        1,000 mg 200 mL/hr over 60 Minutes Intravenous  Once 03/25/24 1447 03/26/24 0105   03/25/24 1447  vancomycin  variable dose per unstable renal function (pharmacist dosing)  Status:  Discontinued         Does not apply See admin instructions 03/25/24 1447 03/27/24 1012   03/25/24 1115  vancomycin  (VANCOCIN ) 50 mg/mL oral solution SOLN 125 mg        125 mg Per Tube 4 times daily 03/25/24 1017 04/04/24 0759   03/25/24  0800  vancomycin  (VANCOCIN ) capsule 125 mg  Status:  Discontinued        125 mg Oral 3 times daily before meals & bedtime 03/25/24 0721 03/25/24 1018   03/23/24 2200  meropenem  (MERREM ) 1 g in sodium chloride  0.9 % 100 mL IVPB  Status:  Discontinued        1 g 200 mL/hr over 30 Minutes Intravenous Every 12 hours 03/23/24 2026 03/25/24 1147   03/23/24 2026  vancomycin  variable dose per unstable renal function (pharmacist dosing)  Status:  Discontinued         Does not apply See admin instructions 03/23/24 2026 03/24/24 1346   03/23/24 1000  vancomycin  (VANCOCIN ) IVPB 1000 mg/200 mL premix       Placed in Followed by Linked Group   1,000 mg 200 mL/hr over 60 Minutes Intravenous  Once 03/23/24 0800 03/23/24 1044   03/23/24 0900  vancomycin  (VANCOCIN ) IVPB 1000 mg/200 mL premix       Placed in Followed by Linked Group   1,000 mg 200 mL/hr over 60 Minutes Intravenous  Once 03/23/24 0800  03/23/24 0908   03/23/24 0845  meropenem  (MERREM ) 1 g in sodium chloride  0.9 % 100 mL IVPB        1 g 200 mL/hr over 30 Minutes Intravenous  Once 03/23/24 0836 03/23/24 0946   03/23/24 0800  vancomycin  (VANCOCIN ) IVPB 1000 mg/200 mL premix  Status:  Discontinued        1,000 mg 200 mL/hr over 60 Minutes Intravenous  Once 03/23/24 0756 03/23/24 0800        I have personally reviewed the following labs and images: CBC: Recent Labs  Lab 03/27/24 0813  WBC 9.2  HGB 9.0*  HCT 27.9*  MCV 80.2  PLT 463*   BMP &GFR Recent Labs  Lab 03/27/24 0220 03/27/24 0813 03/28/24 0241  NA  --  134*  --   K  --  4.7  --   CL  --  104  --   CO2  --  22  --   GLUCOSE  --  104*  --   BUN  --  66*  --   CREATININE  --  1.58*  --   CALCIUM   --  9.1  --   MG 1.9  --  1.8  PHOS 3.0  --   --    Estimated Creatinine Clearance: 26.5 mL/min (A) (by C-G formula based on SCr of 1.58 mg/dL (H)). Liver & Pancreas: Recent Labs  Lab 03/27/24 0813  AST 23  ALT 15  ALKPHOS 58  BILITOT 0.6  PROT 5.1*  ALBUMIN   <1.5*   No results for input(s): LIPASE, AMYLASE in the last 168 hours. No results for input(s): AMMONIA in the last 168 hours. Diabetic: No results for input(s): HGBA1C in the last 72 hours. Recent Labs  Lab 03/27/24 2352 03/28/24 0445 03/28/24 0822 03/28/24 1200 03/28/24 1640  GLUCAP 107* 108* 108* 95 104*   Cardiac Enzymes: No results for input(s): CKTOTAL, CKMB, CKMBINDEX, TROPONINI in the last 168 hours. No results for input(s): PROBNP in the last 8760 hours. Coagulation Profile: No results for input(s): INR, PROTIME in the last 168 hours. Thyroid  Function Tests: No results for input(s): TSH, T4TOTAL, FREET4, T3FREE, THYROIDAB in the last 72 hours. Lipid Profile: No results for input(s): CHOL, HDL, LDLCALC, TRIG, CHOLHDL, LDLDIRECT in the last 72 hours. Anemia Panel: No results for input(s): VITAMINB12, FOLATE, FERRITIN, TIBC, IRON , RETICCTPCT in the last 72 hours. Urine analysis:    Component Value Date/Time   COLORURINE YELLOW 03/23/2024 1103   APPEARANCEUR TURBID (A) 03/23/2024 1103   LABSPEC 1.012 03/23/2024 1103   PHURINE 6.0 03/23/2024 1103   GLUCOSEU NEGATIVE 03/23/2024 1103   HGBUR SMALL (A) 03/23/2024 1103   BILIRUBINUR NEGATIVE 03/23/2024 1103   KETONESUR NEGATIVE 03/23/2024 1103   PROTEINUR 100 (A) 03/23/2024 1103   UROBILINOGEN 1.0 07/11/2014 0914   NITRITE NEGATIVE 03/23/2024 1103   LEUKOCYTESUR LARGE (A) 03/23/2024 1103   Sepsis Labs: Invalid input(s): PROCALCITONIN, LACTICIDVEN  Microbiology: Recent Results (from the past 240 hours)  Culture, blood (Routine x 2)     Status: None   Collection Time: 03/23/24  4:55 PM   Specimen: BLOOD LEFT ARM  Result Value Ref Range Status   Specimen Description BLOOD LEFT ARM  Final   Special Requests   Final    BOTTLES DRAWN AEROBIC ONLY Blood Culture results may not be optimal due to an inadequate volume of blood received in culture bottles    Culture   Final    NO GROWTH 5 DAYS Performed at [facility]  [facility], [address], [place], [place] [address]    Report Status 03/28/2024 FINAL  Final  C Difficile Quick Screen w PCR reflex     Status: Abnormal   Collection Time: 03/25/24 12:14 AM   Specimen: STOOL  Result Value Ref Range Status   C Diff antigen POSITIVE (A) NEGATIVE Final   C Diff toxin NEGATIVE NEGATIVE Final   C Diff interpretation Results are indeterminate. See PCR results.  Final    Comment: Performed at [facility], [address] [place], [place] [address]  C. Diff by PCR, Reflexed     Status: Abnormal   Collection Time: 03/25/24 12:14 AM  Result Value Ref Range Status   Toxigenic C. Difficile by PCR POSITIVE (A) NEGATIVE Final    Comment: Positive for toxigenic C. difficile with little to no toxin production. Only treat if clinical presentation suggests symptomatic illness. Performed at [facility], [address] [place], [place] [address]     Radiology Studies: No results found.    Zedrick Springsteen T. Adam Demary [facility]  If 7PM-7AM, please contact night-coverage [email or web address] 04/02/2024, 12:18 PM

## 2024-04-03 DIAGNOSIS — A419 Sepsis, unspecified organism: Secondary | ICD-10-CM | POA: Diagnosis not present

## 2024-04-03 DIAGNOSIS — R6521 Severe sepsis with septic shock: Secondary | ICD-10-CM | POA: Diagnosis not present

## 2024-04-03 DIAGNOSIS — R509 Fever, unspecified: Secondary | ICD-10-CM | POA: Diagnosis not present

## 2024-04-03 MED ORDER — MORPHINE SULFATE (CONCENTRATE) 10 MG /0.5 ML PO SOLN: 20.0000 mg | ORAL | 0 refills | Status: DC | PRN

## 2024-04-03 MED ORDER — ONDANSETRON HCL 4 MG PO TABS: 4.0000 mg | ORAL_TABLET | Freq: Every day | ORAL | 1 refills | Status: DC | PRN

## 2024-04-03 MED ORDER — GLYCOPYRROLATE 1 MG PO TABS: 1.0000 mg | ORAL_TABLET | Freq: Three times a day (TID) | ORAL | 0 refills | Status: AC

## 2024-04-03 MED ORDER — LORAZEPAM 0.5 MG PO TABS: 0.5000 mg | ORAL_TABLET | ORAL | 0 refills | Status: DC | PRN

## 2024-04-03 NOTE — TOC Transition Note (Signed)
 Transition of Care [facility]) - Discharge Note   Patient Details  Name: [name] MRN: [id number] Date of Birth: 1934/08/03  [facility] CM/SW Contact:  Rosalva Jon Bloch, RN Phone Number: 04/03/2024, 2:13 PM   Clinical Narrative:    Patient will DC to: home Anticipated DC date: 04/03/2024 Family notified: yes, Therisa Briar by: [place]   Per MD patient ready for DC today. RN, patient, patient's daughter, Avaiah Stempel [phone], and [facility] notified of DC.Transportation  paper on chart. PTAR/ ambulance transport requested for patient.  NCM confirmed address with Therisa.  Hospice Intake to be done today @ 6pm.  Therisa made aware PTAR @ bedside to pick pt up for transportation to home.  RNCM will sign off for now as intervention is no longer needed. Please consult us  again if new needs arise.   Final next level of care: Home w Hospice Care Barriers to Discharge: No Barriers Identified   Patient Goals and CMS Choice            Discharge Placement                       Discharge Plan and Services Additional resources added to the After Visit Summary for                                       Social Drivers of Health (SDOH) Interventions SDOH Screenings   Food Insecurity: No Food Insecurity (03/24/2024)  Housing: Low Risk  (03/24/2024)  Transportation Needs: No Transportation Needs (03/24/2024)  Utilities: Not At Risk (03/24/2024)  Depression (PHQ2-9): Low Risk  (05/03/2023)  Financial Resource Strain: Low Risk  (07/17/2023)   Received from [facility]  Physical Activity: Inactive (07/17/2023)   Received from [facility]  Social Connections: Moderately Integrated (03/24/2024)  Stress: No Stress Concern Present (07/17/2023)   Received from [facility]  Tobacco Use: Low Risk  (03/23/2024)  Health Literacy: High Risk (07/17/2023)   Received from [facility]     Readmission Risk Interventions    03/24/2024    2:26 PM  06/12/2023   10:22 AM 03/25/2023    3:24 PM  Readmission Risk Prevention Plan  Post Dischage Appt   Complete  Medication Screening   Complete  Transportation Screening Complete Complete Complete  PCP or Specialist Appt within 5-7 Days Complete Complete   Home Care Screening Complete Complete   Medication Review (RN CM) Complete Referral to Pharmacy

## 2024-04-03 NOTE — Plan of Care (Signed)
  Problem: Respiratory: Goal: Ability to maintain adequate ventilation will improve Outcome: Progressing   Problem: Fluid Volume: Goal: Hemodynamic stability will improve Outcome: Not Progressing   Problem: Clinical Measurements: Goal: Signs and symptoms of infection will decrease Outcome: Not Progressing   Problem: Education: Goal: Knowledge of General Education information will improve Description: Including pain rating scale, medication(s)/side effects and non-pharmacologic comfort measures Outcome: Not Progressing   Problem: Clinical Measurements: Goal: Will remain free from infection Outcome: Not Progressing   Problem: Activity: Goal: Risk for activity intolerance will decrease Outcome: Not Progressing   Problem: Skin Integrity: Goal: Risk for impaired skin integrity will decrease Outcome: Not Progressing

## 2024-04-03 NOTE — Discharge Summary (Signed)
 Physician Discharge Summary  [name] FMW:[id number] DOB: [birth year] DOA: 03/23/2024  PCP: Pcp, No  Admit date: 03/23/2024 Discharge date: 04/03/24  Admitted From: Home Disposition: Home with home hospice  Home Health: Home hospice Equipment/Devices: Hospice to provide  Discharge Condition: Stable but poor prognosis CODE STATUS: DNR Diet Orders (From admission, onward)     Start     Ordered   04/03/24 0000  Diet general       Comments: Dysphagia-2 diet   04/03/24 0840   04/01/24 0959  DIET DYS 2 Room service appropriate? Yes; Fluid consistency: Thin  Diet effective now       Question Answer Comment  Room service appropriate? Yes   Fluid consistency: Thin      04/01/24 0958             Follow-up Information     [facility] Follow up.   Specialty: Hospice and Palliative Medicine Why: home hospice Contact information: [address] [place]  [address] [phone]                Hospital course 89 year old female with HTN, HLD, CAD, CVA, chronic debility and essentially bedbound and wheelchair-bound, CKD 3A comes into the hospital with worsening lethargy. [name] is an Charity fundraiser and primary caregiver. She was admitted to the hospital with working diagnosis of severe sepsis due to sacral osteomyelitis and UTI.  She was started on broad-spectrum antibiotics.  Patient is bedbound.  In hospital day 1 she also has had profuse diarrhea requiring rectal tube placement. C. difficile was positive and she was started on p.o. vancomycin .  Patient is bedbound, and an extremely high likelihood that her sacral wound will never heal but continue to contribute to worsening pain, suffering and loss of quality of life. Goals of care discussions were held with the family on 03/27/2024, and she was  transitioned to comfort care.  Antibiotics discontinued except for p.o. vancomycin .   Patient discharged home with home hospice on 04/03/2024.  See  individual problem list below for more.   Problems addressed during this hospitalization Severe sepsis secondary to sacral osteomyelitis, UTI -she met sepsis criteria on admission with fever, hypotension, tachypnea as well as leukocytosis.  CT scan on admission with sacral wound overlying the distal sacrococcygeal spine with suspicion for underlying osteomyelitis.  She initially was placed on antibiotics. Palliative care was consulted given progressive decline, she is essentially bedbound, and an extremely high likelihood that her sacral wound will never heal but continue to contribute to worsening pain, suffering and loss of quality of life.  Goals of care discussions were held with the family on 7/18, now transitioned to comfort, discontinue all unnecessary antibiotics except for p.o. vancomycin  for her C. Difficile. -Plan for discharge home with home hospice but family unable to take care until Friday. -Continue comfort measures, pain control, Robinul  for secretion, Tylenol  for fever without escalation of antibiotics -Continue dysphagia 2 at daughter's request.   C. difficile colitis -patient has had profuse diarrhea on 7/15 with 10+ liquid bowel movements requiring rectal tube placement.  C. difficile was positive but negative toxin.  Given clinical picture with numerous loose bowel movements, treat with p.o. vancomycin .  Completed 10-days course for comfort purposes, as severe diarrhea can contribute to discomfort and soil the sacral wound   Goal of care-full comfort care   ESBL E. coli and Enterococcus UTI  AKI on CKD-3B Suspect underlying cognitive issues Anemia-in the setting of acute illness, infection, renal failure.  Hgb 6.9 but improved after 1 unit on 7/17.  History of CVA -continue PEG tube, is essentially bedbound.  Family wishes for her to continue tube feeds PAF-continue Xarelto  Urinary retention Hyponatremia Class I obesity  Consultations: Infectious disease Palliative  medicine  Time spent 35  minutes  Vital signs Vitals:   04/01/24 0844 04/01/24 2127 04/02/24 0348 04/02/24 1917  BP: (!) 121/55 (!) 113/51 (!) 116/56 123/84  Pulse: 84 84 85 90  Temp: 100.1 F (37.8 C) (!) 100.8 F (38.2 C) 98.9 F (37.2 C) 98.8 F (37.1 C)  Resp: 18 17 17 18   Height:      Weight:      SpO2: 97% 95% 97% 95%  TempSrc: Axillary Axillary Axillary Axillary  BMI (Calculated):         Discharge exam  GENERAL: No apparent distress.  Sleeping. RESP:  No IWOB.  On room air. CVS:  RRR. Heart sounds normal.  MSK/EXT:  No apparent deformity. No edema.  NEURO: Sleeping. PSYCH: Calm.  No agitation.  Discharge Instructions Discharge Instructions     Diet general   Complete by: As directed    Dysphagia-2 diet   Discharge wound care:   Complete by: As directed    Wound care  Daily      Comments: For pressure injury to sacrum: cleanse sacral wound with NS and pat dry. Gently fill wound bed, including undermining with DAKINS moist kerlix (use small size roll). Add lidocaine  jelly.  Cover with dry gauze and sacral foam.  Change daily and PRN soilage.   Wound care  3 times daily      Comments: Cleanse buttocks/coccyx/around rectum with Vashe wound cleanser Soila [phone]) do not rinse and allow to air dry.  Apply Gerhardt's Butt Cream 3 times a day and prn soiling.   Increase activity slowly   Complete by: As directed       Allergies as of 04/03/2024       Reactions   Atorvastatin Nausea And Vomiting   Contrast Media [iodinated Contrast Media] Other (See Comments)   Nausea/Vomiting and Shortness of Breath   Darvon [propoxyphene] Nausea And Vomiting   Codeine Nausea And Vomiting, Anxiety        Medication List     STOP taking these medications    aspirin  81 MG chewable tablet   doxycycline 100 MG capsule Commonly known as: VIBRAMYCIN   ergocalciferol  1.25 MG (50000 UT) capsule Commonly known as: VITAMIN D2   hydrALAZINE  25 MG tablet Commonly known  as: APRESOLINE    metoprolol  succinate 25 MG 24 hr tablet Commonly known as: TOPROL -XL   nitroGLYCERIN  0.4 MG SL tablet Commonly known as: NITROSTAT    rosuvastatin  40 MG tablet Commonly known as: CRESTOR        TAKE these medications    acetaminophen  500 MG tablet Commonly known as: TYLENOL  Take 2 tablets (1,000 mg total) by mouth every 6 (six) hours as needed for mild pain or moderate pain.   glycopyrrolate  1 MG tablet Commonly known as: ROBINUL  Take 1 tablet (1 mg total) by mouth 3 (three) times daily for 10 days.   ipratropium-albuterol 0.5-2.5 (3) MG/3ML Soln Commonly known as: DUONEB Inhale 3 mLs into the lungs every 4 (four) hours as needed (sob).   LORazepam  0.5 MG tablet Commonly known as: Ativan  Take 1 tablet (0.5 mg total) by mouth every 4 (four) hours as needed for up to 20 doses for anxiety.   Lumigan 0.01 % Soln Generic drug: bimatoprost Place 1 drop  into both eyes at bedtime.   morphine  CONCENTRATE 10 mg / 0.5 ml concentrated solution Take 1 mL (20 mg total) by mouth every 4 (four) hours as needed for severe pain (pain score 7-10).   ondansetron  4 MG tablet Commonly known as: Zofran  Take 1 tablet (4 mg total) by mouth daily as needed for nausea or vomiting.   Rivaroxaban  15 MG Tabs tablet Commonly known as: Xarelto  Take 1 tablet (15 mg total) by mouth daily with supper.   SALINE FLUSH IV Inject into the vein every 4 (four) hours.   Sensitive Eyes Saline Soln 1 drop by Does not apply route in the morning and at bedtime.               Discharge Care Instructions  (From admission, onward)           Start     Ordered   04/03/24 0000  Discharge wound care:       Comments: Wound care  Daily      Comments: For pressure injury to sacrum: cleanse sacral wound with NS and pat dry. Gently fill wound bed, including undermining with DAKINS moist kerlix (use small size roll). Add lidocaine  jelly.  Cover with dry gauze and sacral foam.  Change daily  and PRN soilage.   Wound care  3 times daily      Comments: Cleanse buttocks/coccyx/around rectum with Vashe wound cleanser Soila [phone]) do not rinse and allow to air dry.  Apply Gerhardt's Butt Cream 3 times a day and prn soiling.   04/03/24 0840             Procedures/Studies:   CT ABDOMEN PELVIS WO CONTRAST Result Date: 03/23/2024 CLINICAL DATA:  Fever and sacral pain EXAM: CT ABDOMEN AND PELVIS WITHOUT CONTRAST TECHNIQUE: Multidetector CT imaging of the abdomen and pelvis was performed following the standard protocol without IV contrast. RADIATION DOSE REDUCTION: This exam was performed according to the departmental dose-optimization program which includes automated exposure control, adjustment of the mA and/or kV according to patient size and/or use of iterative reconstruction technique. COMPARISON:  CT abdomen and pelvis dated 09/26/2018 FINDINGS: Decreased sensitivity and specificity for detailed findings due to motion artifact. Lower chest: Mild right lower lobe bronchiectasis. Subsegmental mucous plugging in the left lower lobe where there is compressive atelectasis. Questionable trace tree-in-bud nodules in the right lower lobe. Unchanged right lower lobe 5 mm pulmonary nodule (4:15). No specific follow-up imaging recommended. No pleural effusion or pneumothorax demonstrated. Partially imaged heart size is normal. Coronary artery calcifications. Hepatobiliary: No focal hepatic lesions. No intra or extrahepatic biliary ductal dilation. Trace pneumobilia, most notable in the left hepatic lobe, likely sequela prior sphincterotomy. Cholecystectomy. 7 mm rounded focus of hyperattenuation at the level of the ampulla (2:33). Pancreas: No focal lesions or main ductal dilation. Spleen: Normal in size without focal abnormality. Adrenals/Urinary Tract: No adrenal nodules. No suspicious renal mass on this noncontrast enhanced examination , calculi or hydronephrosis. Subtle layering hyperdensity  within the right posterior urinary bladder. Stomach/Bowel: Moderate hiatal hernia. Percutaneous gastrostomy tube in-situ. The balloon is slightly retracted from the anterior gastric wall. No evidence of bowel wall thickening, distention, or inflammatory changes. Extensive colonic diverticulosis without acute diverticulitis. Large volume stool in the rectum. Appendix is not discretely seen. Vascular/Lymphatic: Aortic atherosclerosis. No enlarged abdominal or pelvic lymph nodes. Reproductive: No adnexal masses. Other: No free fluid, fluid collection, or free air. Musculoskeletal: Multilevel degenerative changes of the partially imaged thoracic and lumbar spine. Diffuse subcutaneous  soft tissue edema overlying the posterior lumbosacral region. Sacral wound overlying the distal sacrococcygeal spine with findings suspicious for erosive changes of the underlying bony cortex (2:70). IMPRESSION: 1. Sacral wound overlying the distal sacrococcygeal spine with findings suspicious for underlying osteomyelitis. 2. Subtle layering hyperdensity within the right posterior urinary bladder, which may represent blood products or debris. Recommend correlation with urinalysis. 3. Percutaneous gastrostomy tube in-situ. The balloon is slightly retracted from the anterior gastric wall. 4. Questionable trace tree-in-bud nodules in the right lower lobe, which may be infectious or inflammatory. 5. A 7 mm rounded hyperattenuating focus at the level of the ampulla, which appears new compared to 2020 may represent ingested material versus stone. The patient is unlikely to tolerate breath holding for MRCP. Recommend further evaluation with right upper quadrant ultrasound examination with dedicated evaluation of the ampulla. 6. Large volume stool in the rectum, which may reflect fecal impaction. 7. Aortic Atherosclerosis (ICD10-I70.0). Coronary artery calcifications. Assessment for potential risk factor modification, dietary therapy or  pharmacologic therapy may be warranted, if clinically indicated. Electronically Signed   By: Limin  Xu M.D.   On: 03/23/2024 10:18   DG Chest 2 View Result Date: 03/23/2024 CLINICAL DATA:  Suspected sepsis. EXAM: CHEST - 2 VIEW COMPARISON:  08/05/2023. FINDINGS: Low lung volume. Mild atelectatic changes at the lung bases. Bilateral lung fields are otherwise clear. No acute consolidation or lung collapse. Bilateral costophrenic angles are clear. Stable cardio-mediastinal silhouette. Small to medium retrocardiac hiatal hernia noted. No acute osseous abnormalities. The soft tissues are within normal limits. IMPRESSION: No active cardiopulmonary disease. Electronically Signed   By: Ree Molt M.D.   On: 03/23/2024 08:39   CUP PACEART REMOTE DEVICE CHECK Result Date: 03/05/2024 ILR summary report received. Battery status OK. Normal device function. No new symptom, tachy, brady, or pause episodes. No new AF episodes. Monthly summary reports and ROV/PRN LA, CVRS      The results of significant diagnostics from this hospitalization (including imaging, microbiology, ancillary and laboratory) are listed below for reference.     Microbiology: Recent Results (from the past 240 hours)  C Difficile Quick Screen w PCR reflex     Status: Abnormal   Collection Time: 03/25/24 12:14 AM   Specimen: STOOL  Result Value Ref Range Status   C Diff antigen POSITIVE (A) NEGATIVE Final   C Diff toxin NEGATIVE NEGATIVE Final   C Diff interpretation Results are indeterminate. See PCR results.  Final    Comment: Performed at [facility], [address], [place], [place] [address]  C. Diff by PCR, Reflexed     Status: Abnormal   Collection Time: 03/25/24 12:14 AM  Result Value Ref Range Status   Toxigenic C. Difficile by PCR POSITIVE (A) NEGATIVE Final    Comment: Positive for toxigenic C. difficile with little to no toxin production. Only treat if clinical presentation suggests symptomatic  illness. Performed at [facility], [address], [place], [place] [address]      Labs:  CBC: No results for input(s): WBC, NEUTROABS, HGB, HCT, MCV, PLT in the last 168 hours. BMP &GFR Recent Labs  Lab 03/28/24 0241  MG 1.8   Estimated Creatinine Clearance: 26.5 mL/min (A) (by C-G formula based on SCr of 1.58 mg/dL (H)). Liver & Pancreas: No results for input(s): AST, ALT, ALKPHOS, BILITOT, PROT, ALBUMIN  in the last 168 hours. No results for input(s): LIPASE, AMYLASE in the last 168 hours. No results for input(s): AMMONIA in the last  168 hours. Diabetic: No results for input(s): HGBA1C in the last 72 hours. Recent Labs  Lab 03/27/24 2352 03/28/24 0445 03/28/24 0822 03/28/24 1200 03/28/24 1640  GLUCAP 107* 108* 108* 95 104*   Cardiac Enzymes: No results for input(s): CKTOTAL, CKMB, CKMBINDEX, TROPONINI in the last 168 hours. No results for input(s): PROBNP in the last 8760 hours. Coagulation Profile: No results for input(s): INR, PROTIME in the last 168 hours. Thyroid  Function Tests: No results for input(s): TSH, T4TOTAL, FREET4, T3FREE, THYROIDAB in the last 72 hours. Lipid Profile: No results for input(s): CHOL, HDL, LDLCALC, TRIG, CHOLHDL, LDLDIRECT in the last 72 hours. Anemia Panel: No results for input(s): VITAMINB12, FOLATE, FERRITIN, TIBC, IRON , RETICCTPCT in the last 72 hours. Urine analysis:    Component Value Date/Time   COLORURINE YELLOW 03/23/2024 1103   APPEARANCEUR TURBID (A) 03/23/2024 1103   LABSPEC 1.012 03/23/2024 1103   PHURINE 6.0 03/23/2024 1103   GLUCOSEU NEGATIVE 03/23/2024 1103   HGBUR SMALL (A) 03/23/2024 1103   BILIRUBINUR NEGATIVE 03/23/2024 1103   KETONESUR NEGATIVE 03/23/2024 1103   PROTEINUR 100 (A) 03/23/2024 1103   UROBILINOGEN 1.0 07/11/2014 0914   NITRITE NEGATIVE 03/23/2024 1103   LEUKOCYTESUR LARGE (A) 03/23/2024 1103   Sepsis  Labs: Invalid input(s): PROCALCITONIN, LACTICIDVEN   SIGNED:  Eliora Nienhuis T Eisa Conaway, MD  [facility] 04/03/2024, 8:40 AM

## 2024-04-06 ENCOUNTER — Ambulatory Visit (INDEPENDENT_AMBULATORY_CARE_PROVIDER_SITE_OTHER)

## 2024-04-06 DIAGNOSIS — Z8673 Personal history of transient ischemic attack (TIA), and cerebral infarction without residual deficits: Secondary | ICD-10-CM | POA: Diagnosis not present

## 2024-04-07 LAB — CUP PACEART REMOTE DEVICE CHECK
Date Time Interrogation Session: 20250727234741
Implantable Pulse Generator Implant Date: 20240506

## 2024-04-10 ENCOUNTER — Emergency Department (HOSPITAL_COMMUNITY)
Admission: EM | Admit: 2024-04-10 | Discharge: 2024-04-10 | Disposition: A | Discharge status: Home / Self Care | Attending: Emergency Medicine | Admitting: Emergency Medicine

## 2024-04-10 ENCOUNTER — Other Ambulatory Visit: Payer: Self-pay

## 2024-04-10 ENCOUNTER — Ambulatory Visit: Payer: Self-pay | Admitting: Internal Medicine

## 2024-04-10 DIAGNOSIS — I2510 Atherosclerotic heart disease of native coronary artery without angina pectoris: Secondary | ICD-10-CM | POA: Diagnosis not present

## 2024-04-10 DIAGNOSIS — Z79899 Other long term (current) drug therapy: Secondary | ICD-10-CM | POA: Insufficient documentation

## 2024-04-10 DIAGNOSIS — K9423 Gastrostomy malfunction: Secondary | ICD-10-CM | POA: Diagnosis present

## 2024-04-10 DIAGNOSIS — Z7901 Long term (current) use of anticoagulants: Secondary | ICD-10-CM | POA: Insufficient documentation

## 2024-04-10 DIAGNOSIS — N183 Chronic kidney disease, stage 3 unspecified: Secondary | ICD-10-CM | POA: Diagnosis not present

## 2024-04-10 DIAGNOSIS — I129 Hypertensive chronic kidney disease with stage 1 through stage 4 chronic kidney disease, or unspecified chronic kidney disease: Secondary | ICD-10-CM | POA: Diagnosis not present

## 2024-04-10 DIAGNOSIS — R410 Disorientation, unspecified: Secondary | ICD-10-CM | POA: Diagnosis not present

## 2024-04-10 DIAGNOSIS — T85698A Other mechanical complication of other specified internal prosthetic devices, implants and grafts, initial encounter: Secondary | ICD-10-CM | POA: Diagnosis not present

## 2024-04-10 NOTE — ED Provider Notes (Signed)
 [facility] EMERGENCY DEPARTMENT AT [facility] Provider Note   CSN: [id number] Arrival date & time: 04/10/24  9862     Patient presents with: clogged feeding tube   [name] is a 89 y.o. female.   HPI     Is an 89 year old female who presents from home with concern for clogged feeding tube.  She is on hospice.  Daughter is at bedside.  Reports they were unable to get unclogged at home with carbonated beverage.  She states that this is the second time they have had difficulty with this feeding tube.  She has had a feeding tube since November.  To her knowledge it has not been exchanged.  Denies any other symptoms.  Prior to Admission medications   Medication Sig Start Date End Date Taking? Authorizing Provider  acetaminophen  (TYLENOL ) 500 MG tablet Take 2 tablets (1,000 mg total) by mouth every 6 (six) hours as needed for mild pain or moderate pain. 03/25/23   Tobie Yetta CHRISTELLA, MD  glycopyrrolate  (ROBINUL ) 1 MG tablet Take 1 tablet (1 mg total) by mouth 3 (three) times daily for 10 days. 04/03/24 04/13/24  Gonfa, Taye T, MD  ipratropium-albuterol (DUONEB) 0.5-2.5 (3) MG/3ML SOLN Inhale 3 mLs into the lungs every 4 (four) hours as needed (sob). 12/17/23   [provider]  LORazepam  (ATIVAN ) 0.5 MG tablet Take 1 tablet (0.5 mg total) by mouth every 4 (four) hours as needed for up to 20 doses for anxiety. 04/03/24   Gonfa, Taye T, MD  LUMIGAN 0.01 % SOLN Place 1 drop into both eyes at bedtime. 12/12/23   [provider]  Morphine  Sulfate (MORPHINE  CONCENTRATE) 10 mg / 0.5 ml concentrated solution Take 1 mL (20 mg total) by mouth every 4 (four) hours as needed for severe pain (pain score 7-10). 04/03/24   Gonfa, Taye T, MD  ondansetron  (ZOFRAN ) 4 MG tablet Take 1 tablet (4 mg total) by mouth daily as needed for nausea or vomiting. 04/03/24 04/03/25  Gonfa, Taye T, MD  Rivaroxaban  (XARELTO ) 15 MG TABS tablet Take 1 tablet (15 mg total) by mouth daily with supper. 09/09/23    Debera Jayson MATSU, MD  Sodium Chloride  Flush (SALINE FLUSH IV) Inject into the vein every 4 (four) hours.    [provider]  Soft Lens Products (SENSITIVE EYES SALINE) SOLN 1 drop by Does not apply route in the morning and at bedtime.    [provider]    Allergies: Atorvastatin, Contrast media [iodinated contrast media], Darvon [propoxyphene], and Codeine    Review of Systems  Constitutional:  Negative for fever.  Gastrointestinal:  Negative for abdominal distention.  All other systems reviewed and are negative.   Updated Vital Signs BP (!) 106/49 (BP Location: Left Arm)   Pulse 83   Temp 98.6 F (37 C)   Resp 18   Ht 1.651 m (5' 5)   Wt 81.9 kg   SpO2 100%   BMI 30.05 kg/m   Physical Exam Vitals and nursing note reviewed.  Constitutional:      Appearance: She is well-developed. She is obese.     Comments: Chronically ill-appearing but nontoxic  HENT:     Head: Normocephalic and atraumatic.  Cardiovascular:     Rate and Rhythm: Normal rate and regular rhythm.  Pulmonary:     Effort: Pulmonary effort is normal. No respiratory distress.  Abdominal:     General: Bowel sounds are normal.     Palpations: Abdomen is soft.  Tenderness: There is no abdominal tenderness.     Comments: G-tube in place without surrounding erythema or skin breakdown  Musculoskeletal:     Cervical back: Neck supple.     Comments: Bilateral upper extremity contractures  Skin:    General: Skin is warm and dry.  Neurological:     Mental Status: She is alert.  Psychiatric:        Mood and Affect: Mood normal.     (all labs ordered are listed, but only abnormal results are displayed) Labs Reviewed - No data to display  EKG: None  Radiology: No results found.   Procedures   Medications Ordered in the ED - No data to display                                  Medical Decision Making  This patient presents to the ED for concern of G-tube obstruction, this  involves an extensive number of treatment options, and is a complaint that carries with it a high risk of complications and morbidity.  I considered the following differential and admission for this acute, potentially life threatening condition.  The differential diagnosis includes obstruction, dislodgment  MDM:    This is a 89 year old female on hospice and G-tube dependent who presents with concern for G-tube obstruction.  She is nontoxic.  Vital signs are reassuring.  Abdomen is soft.  Nursing was able to unclog G-tube at the bedside.  I was able to flush both water  and carbonated beverage without any difficulty or resistance.  I am unable to tell what size the tube this is.  Given that it is functioning at this time, would defer as an outpatient.  Discussed with daughter that she might benefit from tube exchanges given recent issues with the clogging; however since this appears to be a different tube and I am unable to confirm sizing, would hesitate to pull and replace here.  Originally placed to [facility].  Recommend follow-up with PCP and hospitalist to arrange this as an outpatient.  (Labs, imaging, consults)  Labs: I Ordered, and personally interpreted labs.  The pertinent results include: None  Imaging Studies ordered: I ordered imaging studies including none I independently visualized and interpreted imaging. I agree with the radiologist interpretation  Additional history obtained from chart review.  External records from outside source obtained and reviewed including prior evaluation  Cardiac Monitoring: The patient was not maintained on a cardiac monitor.  If on the cardiac monitor, I personally viewed and interpreted the cardiac monitored which showed an underlying rhythm of: N/A  Reevaluation: After the interventions noted above, I reevaluated the patient and found that they have :resolved  Social Determinants of Health:  hospice  Disposition: Discharge  Co morbidities  that complicate the patient evaluation  Past Medical History:  Diagnosis Date   Arthritis    CKD (chronic kidney disease), stage III (HCC)    Complete heart block (HCC) 11/16/2016   a. transient during NSTEMI, resolved with revascularization.   Coronary artery disease 11/2009   a. with prior stent placement to the ramus intermedius and RCA. b. NSTEMI 11/2016 s/p DES to DES to distal Cx with moderate residual dz.   CVA (cerebral vascular accident) [facility])    Essential hypertension    Mixed hyperlipidemia    Non-ST elevation (NSTEMI) myocardial infarction (HCC) 11/16/2016   Obesity    Osteoarthritis    Reflux esophagitis  Sleep apnea 09/27/2010   Untreated, REM 64.7/hr AHI 18.5/hr RDI 19.2/hr. Patuent refused CPAP therapy     Medicines No orders of the defined types were placed in this encounter.   I have reviewed the patients home medicines and have made adjustments as needed  Problem List / ED Course: Problem List Items Addressed This Visit   None Visit Diagnoses       Gastrostomy tube obstruction [facility])    -  Primary                Final diagnoses:  Gastrostomy tube obstruction [facility])    ED Discharge Orders     None          Bari Charmaine FALCON, MD 04/10/24 [phone]

## 2024-04-10 NOTE — ED Triage Notes (Signed)
 Pt on hospice and has clogged g tube. Hospice nurse was unable to unclog. Bp 102/60 hr70 99%ra

## 2024-04-10 NOTE — Discharge Instructions (Signed)
 You were seen today with a clogged gastrostomy tube.  You may benefit from tube change.  Discussed with hospice and primary care for arranging this as an outpatient.

## 2024-04-15 ENCOUNTER — Other Ambulatory Visit (HOSPITAL_COMMUNITY): Payer: Self-pay | Admitting: Internal Medicine

## 2024-04-15 DIAGNOSIS — Z431 Encounter for attention to gastrostomy: Secondary | ICD-10-CM

## 2024-04-20 ENCOUNTER — Other Ambulatory Visit (HOSPITAL_COMMUNITY): Payer: Self-pay | Admitting: Internal Medicine

## 2024-04-20 ENCOUNTER — Other Ambulatory Visit (HOSPITAL_COMMUNITY): Payer: Self-pay | Admitting: Student

## 2024-04-20 ENCOUNTER — Ambulatory Visit (HOSPITAL_COMMUNITY)
Admission: RE | Admit: 2024-04-20 | Discharge: 2024-04-20 | Disposition: A | Discharge status: Home / Self Care | Source: Ambulatory Visit | Attending: Internal Medicine | Admitting: Internal Medicine

## 2024-04-20 ENCOUNTER — Encounter (HOSPITAL_COMMUNITY): Payer: Self-pay

## 2024-04-20 ENCOUNTER — Ambulatory Visit (HOSPITAL_COMMUNITY)
Admission: RE | Admit: 2024-04-20 | Discharge: 2024-04-20 | Disposition: A | Discharge status: Home / Self Care | Source: Ambulatory Visit | Attending: Student | Admitting: Student

## 2024-04-20 DIAGNOSIS — Z431 Encounter for attention to gastrostomy: Secondary | ICD-10-CM

## 2024-04-20 DIAGNOSIS — K9420 Gastrostomy complication, unspecified: Secondary | ICD-10-CM

## 2024-04-20 HISTORY — PX: IR PATIENT EVAL TECH 0-60 MINS: IMG5564

## 2024-04-20 HISTORY — PX: IR RADIOLOGIST EVAL & MGMT: IMG5224

## 2024-04-20 MED ORDER — LIDOCAINE VISCOUS HCL 2 % MT SOLN
OROMUCOSAL | Status: AC
  Filled 2024-04-20: qty 15

## 2024-04-20 NOTE — Procedures (Signed)
 Patients name and DOB were verified. Patients daughter was brought back to discuss with Kacie Matthews PA about changing this tube out. It was concluded that the patient keep the current pullthrough tube rather than changing it out. Daughter spoke with family and hospice.   Kymberlyn Eckford RT(R)

## 2024-04-20 NOTE — Progress Notes (Signed)
 Interventional Radiology Brief Note:  [name] is an 89 year old female with chronic debility on hospice who receives TF via G-tube placed surgically at [facility] 07/2023.  She was referred to IR today by her surgeon for tube replacement due to clogged tube.   On presentation today, patient with 24Fr pull-through gastrostomy in place.   Tube flushes well, without resistance or leakage.  Site intact.   Daughter available at bedside and states that the tube was clogged several days ago with difficulty getting it unclogged by home hospice RN.  Initially they presented to the ED for eval, however the tube was eventually unclogged and has been working well since this time.   Discussed the type of tube she currently has in place and the risks/benefits of tube replacement.  Although tube may need to be replaced at some point, it is currently functioning well without issue.  Given it is a pull-through tube, I  recommended to daughter this remain in place until no longer suitable at which time she may reschedule for routine exchange to a balloon-rentention tube.   Daughter has discussed with hospice team who are in agreement.   Nikolay Demetriou, MS RD PA-C

## 2024-05-06 ENCOUNTER — Other Ambulatory Visit: Payer: Self-pay | Admitting: Cardiology

## 2024-05-06 NOTE — Telephone Encounter (Signed)
 Prescription refill request for Xarelto  received.  Indication:afib Last office visit:2/25 Weight:81.9  kg Age:89 Scr:1.58  7/25 CrCl:32.43  ml/min  Prescription refilled

## 2024-05-07 ENCOUNTER — Ambulatory Visit (INDEPENDENT_AMBULATORY_CARE_PROVIDER_SITE_OTHER)

## 2024-05-07 DIAGNOSIS — Z8673 Personal history of transient ischemic attack (TIA), and cerebral infarction without residual deficits: Secondary | ICD-10-CM

## 2024-05-07 LAB — CUP PACEART REMOTE DEVICE CHECK
Date Time Interrogation Session: 20250827233348
Implantable Pulse Generator Implant Date: 20240506

## 2024-05-11 ENCOUNTER — Ambulatory Visit: Payer: Self-pay | Admitting: Internal Medicine

## 2024-05-21 NOTE — Progress Notes (Signed)
 Remote Loop Recorder Transmission

## 2024-05-22 NOTE — Progress Notes (Signed)
 Remote Loop Recorder Transmission

## 2024-06-08 ENCOUNTER — Encounter

## 2024-06-10 DEATH — deceased

## 2024-06-11 NOTE — Progress Notes (Signed)
 Remote Loop Recorder Transmission

## 2024-07-09 ENCOUNTER — Encounter
